# Patient Record
Sex: Female | Born: 1955
Health system: Southern US, Community
[De-identification: ages and names within clinical notes are randomized; demographics above are authoritative.]

## PROBLEM LIST (undated history)

## (undated) DIAGNOSIS — T4145XA Adverse effect of unspecified anesthetic, initial encounter: Secondary | ICD-10-CM

## (undated) DIAGNOSIS — R32 Unspecified urinary incontinence: Secondary | ICD-10-CM

## (undated) DIAGNOSIS — D649 Anemia, unspecified: Secondary | ICD-10-CM

## (undated) DIAGNOSIS — F32A Depression, unspecified: Secondary | ICD-10-CM

## (undated) DIAGNOSIS — F329 Major depressive disorder, single episode, unspecified: Secondary | ICD-10-CM

## (undated) DIAGNOSIS — J449 Chronic obstructive pulmonary disease, unspecified: Secondary | ICD-10-CM

## (undated) DIAGNOSIS — Z923 Personal history of irradiation: Secondary | ICD-10-CM

## (undated) DIAGNOSIS — E2839 Other primary ovarian failure: Secondary | ICD-10-CM

## (undated) DIAGNOSIS — G4730 Sleep apnea, unspecified: Secondary | ICD-10-CM

## (undated) DIAGNOSIS — R809 Proteinuria, unspecified: Secondary | ICD-10-CM

## (undated) DIAGNOSIS — E119 Type 2 diabetes mellitus without complications: Secondary | ICD-10-CM

## (undated) DIAGNOSIS — R0902 Hypoxemia: Secondary | ICD-10-CM

## (undated) DIAGNOSIS — I2510 Atherosclerotic heart disease of native coronary artery without angina pectoris: Secondary | ICD-10-CM

## (undated) DIAGNOSIS — Z9981 Dependence on supplemental oxygen: Secondary | ICD-10-CM

## (undated) DIAGNOSIS — M792 Neuralgia and neuritis, unspecified: Secondary | ICD-10-CM

## (undated) DIAGNOSIS — Z9221 Personal history of antineoplastic chemotherapy: Secondary | ICD-10-CM

## (undated) DIAGNOSIS — C189 Malignant neoplasm of colon, unspecified: Secondary | ICD-10-CM

## (undated) DIAGNOSIS — I10 Essential (primary) hypertension: Secondary | ICD-10-CM

## (undated) DIAGNOSIS — Z87442 Personal history of urinary calculi: Secondary | ICD-10-CM

## (undated) DIAGNOSIS — H3530 Unspecified macular degeneration: Secondary | ICD-10-CM

## (undated) DIAGNOSIS — K760 Fatty (change of) liver, not elsewhere classified: Secondary | ICD-10-CM

## (undated) DIAGNOSIS — I499 Cardiac arrhythmia, unspecified: Secondary | ICD-10-CM

## (undated) DIAGNOSIS — T8859XA Other complications of anesthesia, initial encounter: Secondary | ICD-10-CM

## (undated) DIAGNOSIS — E213 Hyperparathyroidism, unspecified: Secondary | ICD-10-CM

## (undated) DIAGNOSIS — Z8709 Personal history of other diseases of the respiratory system: Secondary | ICD-10-CM

## (undated) DIAGNOSIS — E785 Hyperlipidemia, unspecified: Secondary | ICD-10-CM

## (undated) DIAGNOSIS — K649 Unspecified hemorrhoids: Secondary | ICD-10-CM

## (undated) DIAGNOSIS — J439 Emphysema, unspecified: Secondary | ICD-10-CM

## (undated) DIAGNOSIS — M542 Cervicalgia: Secondary | ICD-10-CM

## (undated) DIAGNOSIS — I479 Paroxysmal tachycardia, unspecified: Secondary | ICD-10-CM

## (undated) DIAGNOSIS — M1990 Unspecified osteoarthritis, unspecified site: Secondary | ICD-10-CM

## (undated) DIAGNOSIS — Z981 Arthrodesis status: Secondary | ICD-10-CM

## (undated) DIAGNOSIS — K219 Gastro-esophageal reflux disease without esophagitis: Secondary | ICD-10-CM

## (undated) DIAGNOSIS — G8929 Other chronic pain: Secondary | ICD-10-CM

## (undated) DIAGNOSIS — R7303 Prediabetes: Secondary | ICD-10-CM

## (undated) DIAGNOSIS — F419 Anxiety disorder, unspecified: Secondary | ICD-10-CM

## (undated) DIAGNOSIS — K7581 Nonalcoholic steatohepatitis (NASH): Secondary | ICD-10-CM

## (undated) DIAGNOSIS — R011 Cardiac murmur, unspecified: Secondary | ICD-10-CM

## (undated) DIAGNOSIS — R112 Nausea with vomiting, unspecified: Secondary | ICD-10-CM

## (undated) DIAGNOSIS — L409 Psoriasis, unspecified: Secondary | ICD-10-CM

## (undated) DIAGNOSIS — I700 Atherosclerosis of aorta: Secondary | ICD-10-CM

## (undated) DIAGNOSIS — H269 Unspecified cataract: Secondary | ICD-10-CM

## (undated) DIAGNOSIS — Z9889 Other specified postprocedural states: Secondary | ICD-10-CM

## (undated) DIAGNOSIS — K5900 Constipation, unspecified: Secondary | ICD-10-CM

## (undated) DIAGNOSIS — T7840XA Allergy, unspecified, initial encounter: Secondary | ICD-10-CM

## (undated) HISTORY — DX: Hyperlipidemia, unspecified: E78.5

## (undated) HISTORY — DX: Sleep apnea, unspecified: G47.30

## (undated) HISTORY — DX: Gastro-esophageal reflux disease without esophagitis: K21.9

## (undated) HISTORY — PX: NECK SURGERY: SHX720

## (undated) HISTORY — DX: Unspecified cataract: H26.9

## (undated) HISTORY — DX: Essential (primary) hypertension: I10

## (undated) HISTORY — DX: Personal history of other diseases of the respiratory system: Z87.09

## (undated) HISTORY — DX: Allergy, unspecified, initial encounter: T78.40XA

## (undated) HISTORY — DX: Hypoxemia: R09.02

## (undated) HISTORY — DX: Proteinuria, unspecified: R80.9

## (undated) HISTORY — DX: Unspecified urinary incontinence: R32

## (undated) HISTORY — DX: Neuralgia and neuritis, unspecified: M79.2

## (undated) HISTORY — PX: ABDOMINAL HYSTERECTOMY: SHX81

## (undated) HISTORY — DX: Unspecified hemorrhoids: K64.9

## (undated) HISTORY — PX: BREAST SURGERY: SHX581

## (undated) HISTORY — DX: Cardiac murmur, unspecified: R01.1

## (undated) HISTORY — DX: Type 2 diabetes mellitus without complications: E11.9

## (undated) HISTORY — DX: Other primary ovarian failure: E28.39

## (undated) HISTORY — DX: Paroxysmal tachycardia, unspecified: I47.9

## (undated) HISTORY — DX: Unspecified macular degeneration: H35.30

## (undated) HISTORY — DX: Constipation, unspecified: K59.00

## (undated) HISTORY — PX: SPINE SURGERY: SHX786

## (undated) HISTORY — DX: Emphysema, unspecified: J43.9

## (undated) HISTORY — PX: TUBAL LIGATION: SHX77

## (undated) HISTORY — DX: Psoriasis, unspecified: L40.9

---
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
## 1988-08-09 HISTORY — PX: ANTERIOR FUSION LUMBAR SPINE: SUR629

## 1998-08-22 ENCOUNTER — Ambulatory Visit (HOSPITAL_COMMUNITY): Admission: RE | Admit: 1998-08-22 | Discharge: 1998-08-22 | Payer: Self-pay | Admitting: Neurosurgery

## 1998-08-22 ENCOUNTER — Encounter: Payer: Self-pay | Admitting: Neurosurgery

## 1998-10-30 ENCOUNTER — Encounter: Payer: Self-pay | Admitting: Neurosurgery

## 1998-11-03 ENCOUNTER — Inpatient Hospital Stay (HOSPITAL_COMMUNITY): Admission: RE | Admit: 1998-11-03 | Discharge: 1998-11-05 | Payer: Self-pay | Admitting: Neurosurgery

## 1998-11-03 ENCOUNTER — Encounter: Payer: Self-pay | Admitting: Neurosurgery

## 1999-03-21 ENCOUNTER — Encounter: Payer: Self-pay | Admitting: Neurosurgery

## 1999-03-21 ENCOUNTER — Ambulatory Visit (HOSPITAL_COMMUNITY): Admission: RE | Admit: 1999-03-21 | Discharge: 1999-03-21 | Payer: Self-pay | Admitting: Neurosurgery

## 1999-06-16 ENCOUNTER — Encounter: Payer: Self-pay | Admitting: Neurosurgery

## 1999-06-16 ENCOUNTER — Encounter: Admission: RE | Admit: 1999-06-16 | Discharge: 1999-06-16 | Payer: Self-pay | Admitting: Neurosurgery

## 1999-08-10 HISTORY — PX: ANTERIOR CERVICAL DECOMP/DISCECTOMY FUSION: SHX1161

## 1999-08-10 HISTORY — PX: OTHER SURGICAL HISTORY: SHX169

## 2005-05-10 ENCOUNTER — Emergency Department: Payer: Self-pay | Admitting: General Practice

## 2005-06-07 ENCOUNTER — Other Ambulatory Visit: Payer: Self-pay

## 2005-06-07 ENCOUNTER — Emergency Department: Payer: Self-pay | Admitting: Emergency Medicine

## 2005-06-22 DIAGNOSIS — I272 Pulmonary hypertension, unspecified: Secondary | ICD-10-CM

## 2005-06-22 HISTORY — DX: Pulmonary hypertension, unspecified: I27.20

## 2005-08-26 ENCOUNTER — Ambulatory Visit: Payer: Self-pay | Admitting: Cardiovascular Disease

## 2006-02-05 ENCOUNTER — Emergency Department: Payer: Self-pay | Admitting: Emergency Medicine

## 2006-05-26 ENCOUNTER — Ambulatory Visit: Payer: Self-pay | Admitting: Gastroenterology

## 2006-05-26 LAB — HM COLONOSCOPY

## 2006-06-03 ENCOUNTER — Ambulatory Visit: Payer: Self-pay | Admitting: Family Medicine

## 2006-06-29 ENCOUNTER — Ambulatory Visit: Payer: Self-pay

## 2006-08-30 ENCOUNTER — Emergency Department: Payer: Self-pay | Admitting: Emergency Medicine

## 2007-07-18 ENCOUNTER — Ambulatory Visit: Payer: Self-pay | Admitting: Unknown Physician Specialty

## 2007-12-07 ENCOUNTER — Ambulatory Visit: Payer: Self-pay | Admitting: Unknown Physician Specialty

## 2007-12-12 ENCOUNTER — Ambulatory Visit: Payer: Self-pay | Admitting: Unknown Physician Specialty

## 2008-02-16 ENCOUNTER — Ambulatory Visit: Payer: Self-pay | Admitting: Unknown Physician Specialty

## 2008-10-07 LAB — HM DEXA SCAN

## 2009-02-27 ENCOUNTER — Ambulatory Visit: Payer: Self-pay | Admitting: Family Medicine

## 2009-12-03 ENCOUNTER — Emergency Department: Payer: Self-pay | Admitting: Emergency Medicine

## 2011-02-14 ENCOUNTER — Emergency Department: Payer: Self-pay | Admitting: Internal Medicine

## 2011-12-07 ENCOUNTER — Ambulatory Visit: Payer: Self-pay | Admitting: Family Medicine

## 2012-05-09 HISTORY — PX: CARDIAC CATHETERIZATION: SHX172

## 2012-05-16 ENCOUNTER — Ambulatory Visit: Payer: Self-pay | Admitting: Specialist

## 2012-05-22 ENCOUNTER — Ambulatory Visit: Payer: Self-pay | Admitting: Cardiology

## 2012-06-08 ENCOUNTER — Emergency Department: Payer: Self-pay | Admitting: Unknown Physician Specialty

## 2012-06-08 LAB — TROPONIN I
Troponin-I: <0.02 ng/mL
Troponin-I: <0.02 ng/mL

## 2012-06-08 LAB — BASIC METABOLIC PANEL
Chloride: 110 mmol/L — ABNORMAL HIGH (ref 98–107)
Co2: 24 mmol/L (ref 21–32)
Creatinine: 0.64 mg/dL (ref 0.60–1.30)
EGFR (African American): >60
Osmolality: 286 (ref 275–301)
Potassium: 3.5 mmol/L (ref 3.5–5.1)
Sodium: 144 mmol/L (ref 136–145)

## 2012-06-08 LAB — CBC
HCT: 36.6 % (ref 35.0–47.0)
HGB: 12.4 g/dL (ref 12.0–16.0)
MCH: 29.7 pg (ref 26.0–34.0)
MCHC: 34 g/dL (ref 32.0–36.0)
MCV: 87 fL (ref 80–100)
RBC: 4.19 10*6/uL (ref 3.80–5.20)
RDW: 14.6 % — ABNORMAL HIGH (ref 11.5–14.5)

## 2012-06-08 LAB — URINALYSIS, COMPLETE
Bacteria: NONE SEEN
Blood: NEGATIVE
Glucose,UR: NEGATIVE mg/dL (ref 0–75)
Nitrite: NEGATIVE
RBC,UR: NONE SEEN /HPF (ref 0–5)
Specific Gravity: 1.005 (ref 1.003–1.030)
Squamous Epithelial: NONE SEEN

## 2012-06-08 LAB — CK TOTAL AND CKMB (NOT AT ARMC)
CK, Total: 128 U/L (ref 21–215)
CK-MB: 2.2 ng/mL (ref 0.5–3.6)

## 2012-08-06 ENCOUNTER — Emergency Department: Payer: Self-pay | Admitting: Emergency Medicine

## 2012-09-18 ENCOUNTER — Encounter: Payer: Self-pay | Admitting: *Deleted

## 2012-09-25 ENCOUNTER — Ambulatory Visit (INDEPENDENT_AMBULATORY_CARE_PROVIDER_SITE_OTHER): Payer: BC Managed Care – PPO | Admitting: Cardiovascular Disease

## 2012-09-25 ENCOUNTER — Encounter: Payer: Self-pay | Admitting: Cardiovascular Disease

## 2012-09-25 VITALS — BP 120/88 | HR 84 | Ht 64.0 in | Wt 176.5 lb

## 2012-09-25 DIAGNOSIS — I10 Essential (primary) hypertension: Secondary | ICD-10-CM

## 2012-09-25 DIAGNOSIS — R002 Palpitations: Secondary | ICD-10-CM

## 2012-09-25 DIAGNOSIS — I479 Paroxysmal tachycardia, unspecified: Secondary | ICD-10-CM

## 2012-09-25 MED ORDER — METOPROLOL TARTRATE 50 MG PO TABS: 50.0000 mg | ORAL_TABLET | Freq: Two times a day (BID) | ORAL | Status: DC

## 2012-09-25 MED ORDER — LOSARTAN POTASSIUM-HCTZ 100-12.5 MG PO TABS: 1.0000 | ORAL_TABLET | Freq: Every day | ORAL | Status: DC

## 2012-09-25 NOTE — Assessment & Plan Note (Signed)
Her blood pressure is reasonably controlled. However, she has significant lower extremity edema which I suspect is due to chronic venous insufficiency as well as amlodipine. Thus, I will start amlodipine benazepril and switch her to losartan/ hydrochlorothiazide 100/12.5 mg once daily. Check basic metabolic profile in one week. Continue to monitor home blood pressure. Echocardiogram from last year showed normal LV systolic function. Right heart catheterization showed no significant pulmonary hypertension.

## 2012-09-25 NOTE — Patient Instructions (Addendum)
Stop Sotalol Stop Amlodipine- Benazepril.   Start Metoprolol 50 mg twice daily.  Start Losartan-HCTZ 100-12.5 mg once daily.   Check labs in 1 week Follow up in 1 month.

## 2012-09-25 NOTE — Progress Notes (Signed)
HPI  This is a pleasant 57 year old female who is here today to establish cardiovascular care. She has been seen in the past by Dr. Humphrey Rolls at [facility] for unspecified tachycardia. She has been placed on sotalol for at least 5 years with no recurrent documented arrhythmia. She takes 80 mg once daily. She also has known history of hypertension. She has unspecified lung disease followed by Dr. Raul Del. She had an echocardiogram done last year which showed normal LV systolic function. However, there was evidence of moderate pulmonary hypertension with a systolic pulmonary artery pressure of 56 mm mercury. Thus, she underwent a right heart catheterization which showed only mild pulmonary hypertension with a mean pressure of 21 mm mercury. Her main issue at this time is lower extremity edema which has been worsening over the last few months. It's worse at the end of the day and associated with significant tenderness. She has no history of previous DVT. She denies significant chest pain or dyspnea. She has very minor palpitations which are not frequent.  Allergies  Allergen Reactions  . Sulfa Antibiotics      Current Outpatient Prescriptions on File Prior to Visit  Medication Sig Dispense Refill  . ALPRAZolam (XANAX) 0.5 MG tablet Take 0.5 mg by mouth at bedtime as needed for sleep.      Marland Kitchen diclofenac (VOLTAREN) 75 MG EC tablet Take 75 mg by mouth 2 (two) times daily.       Marland Kitchen gemfibrozil (LOPID) 600 MG tablet Take 600 mg by mouth daily.      Marland Kitchen HYDROcodone-acetaminophen (NORCO/VICODIN) 5-325 MG per tablet Take 1 tablet by mouth 3 (three) times daily as needed for pain.      Marland Kitchen tiZANidine (ZANAFLEX) 4 MG tablet Take 4 mg by mouth 3 (three) times daily as needed.      . vitamin E 400 UNIT capsule Take 400 Units by mouth daily.       No current facility-administered medications on file prior to visit.     Past Medical History  Diagnosis Date  . H/O allergic rhinitis   . Neuritis     . Urinary incontinence   . Proteinuria   . GERD (gastroesophageal reflux disease)   . Hyperlipidemia   . Ovarian failure   . Constipation   . Psoriasis   . Heart murmur   . Tachycardia, paroxysmal   . Hypertension      Past Surgical History  Procedure Laterality Date  . Tubal ligation      s/p  . Tonsillectomy      history  . Herniated disc repair    . Anterior fusion lumbar spine    . Cardiac catheterization  2009?     [facility]  . Cardiac catheterization  05/2012    RHC: Mild hypertension 37/12 with a mean pressure of 21 mm mercury     Family History  Problem Relation Age of Onset  . Heart attack Mother      History   Social History  . Marital Status: Married    Spouse Name: N/A    Number of Children: N/A  . Years of Education: N/A   Occupational History  . Not on file.   Social History Main Topics  . Smoking status: Former Smoker -- 1.00 packs/day for 32 years    Types: Cigarettes  . Smokeless tobacco: Not on file  . Alcohol Use: No  . Drug Use: No  . Sexually Active: Not on file   Other Topics  Concern  . Not on file   Social History Narrative  . No narrative on file     ROS Constitutional: Negative for fever, chills, diaphoresis, activity change, appetite change and fatigue.  HENT: Negative for hearing loss, nosebleeds, congestion, sore throat, facial swelling, drooling, trouble swallowing, neck pain, voice change, sinus pressure and tinnitus.  Eyes: Negative for photophobia, pain, discharge and visual disturbance.  Respiratory: Negative for apnea, cough, chest tightness, shortness of breath and wheezing.  Cardiovascular: Negative for chest pain. Gastrointestinal: Negative for nausea, vomiting, abdominal pain, diarrhea, constipation, blood in stool and abdominal distention.  Genitourinary: Negative for dysuria, urgency, frequency, hematuria and decreased urine volume.  Musculoskeletal: Negative for myalgias, back pain, joint swelling,  arthralgias and gait problem.  Skin: Negative for color change, pallor, rash and wound.  Neurological: Negative for dizziness, tremors, seizures, syncope, speech difficulty, weakness, light-headedness, numbness and headaches.  Psychiatric/Behavioral: Negative for suicidal ideas, hallucinations, behavioral problems and agitation. The patient is not nervous/anxious.     PHYSICAL EXAM   BP 120/88  Pulse 84  Ht 5' 4"  (1.626 m)  Wt 176 lb 8 oz (80.06 kg)  BMI 30.28 kg/m2 Constitutional: She is oriented to person, place, and time. She appears well-developed and well-nourished. No distress.  HENT: No nasal discharge.  Head: Normocephalic and atraumatic.  Eyes: Pupils are equal and round. Right eye exhibits no discharge. Left eye exhibits no discharge.  Neck: Normal range of motion. Neck supple. No JVD present. No thyromegaly present.  Cardiovascular: Normal rate, regular rhythm, normal heart sounds. Exam reveals no gallop and no friction rub. No murmur heard.  Pulmonary/Chest: Effort normal and breath sounds normal. No stridor. No respiratory distress. She has no wheezes. She has no rales. She exhibits no tenderness.  Abdominal: Soft. Bowel sounds are normal. She exhibits no distension. There is no tenderness. There is no rebound and no guarding.  Musculoskeletal: Normal range of motion. She exhibits + 1 edema with mild tenderness.  Neurological: She is alert and oriented to person, place, and time. Coordination normal.  Skin: Skin is warm and dry. No rash noted. She is not diaphoretic. No erythema. No pallor.  Psychiatric: She has a normal mood and affect. Her behavior is normal. Judgment and thought content normal.     EKG: Sinus  Rhythm  WITHIN NORMAL LIMITS   ASSESSMENT AND PLAN

## 2012-09-25 NOTE — Assessment & Plan Note (Signed)
The patient has unspecified tachycardia with no recent episodes. He does not seem that she had atrial fibrillation. Thus, I will go ahead and stop sotalol as well as atenolol. I will put her instead on metoprolol 50 mg twice daily. Continue to monitor symptoms. If she develops recurrent palpitations, I would consider monitoring to see what kind of arrhythmia she has.

## 2012-09-26 ENCOUNTER — Telehealth: Payer: Self-pay

## 2012-09-26 NOTE — Telephone Encounter (Signed)
Message copied by [facility], Adell Panek E on Tue Sep 26, 2012  7:58 AM ------      Message from: Lorine Bears A      Created: Mon Sep 25, 2012  3:57 PM       I asked her to stop taking Atenolol but forgot to write it on the AVS. Please call her and make sure she does not take Atenolol.       Basically , Metoprolol is replacing both Sotalol and Atenolol.  ------

## 2012-09-26 NOTE — Telephone Encounter (Signed)
lmtcb

## 2012-09-26 NOTE — Telephone Encounter (Signed)
Pt informed Understanding verb Says she already did this

## 2012-10-02 ENCOUNTER — Ambulatory Visit (INDEPENDENT_AMBULATORY_CARE_PROVIDER_SITE_OTHER): Payer: BC Managed Care – PPO

## 2012-10-02 DIAGNOSIS — I10 Essential (primary) hypertension: Secondary | ICD-10-CM

## 2012-10-03 LAB — BASIC METABOLIC PANEL
CO2: 26 mmol/L (ref 19–28)
Calcium: 9.7 mg/dL (ref 8.7–10.2)
Creatinine, Ser: 0.93 mg/dL (ref 0.57–1.00)
Sodium: 144 mmol/L (ref 134–144)

## 2012-10-20 ENCOUNTER — Telehealth: Payer: Self-pay

## 2012-10-20 NOTE — Telephone Encounter (Signed)
Since pt has been on Toprol, has made pt very "tired and fatigued". Please advise

## 2012-10-20 NOTE — Telephone Encounter (Signed)
I called her and asked her to decrease Losartan/HCTZ to 1/2 tablet daily. I did not want to decrease Metoprolol given that she is slightly tachycardiac. Check on her on Monday. If HR is >100 , bring her for an EKG.

## 2012-10-20 NOTE — Telephone Encounter (Signed)
Pt states she has been very fatigued since starting metoprolol 50 mg BID HR=</=115 BPM BP=90/65 Ok to decrease dose?

## 2012-10-23 NOTE — Telephone Encounter (Signed)
Pt says HR staying less than 100 BPM and she is feeling "some better" She will call us if HR is elevated again

## 2012-10-23 NOTE — Telephone Encounter (Signed)
lmtcb

## 2012-11-03 ENCOUNTER — Encounter: Payer: Self-pay | Admitting: Cardiovascular Disease

## 2012-11-03 ENCOUNTER — Ambulatory Visit (INDEPENDENT_AMBULATORY_CARE_PROVIDER_SITE_OTHER): Payer: BC Managed Care – PPO | Admitting: Cardiovascular Disease

## 2012-11-03 VITALS — BP 100/76 | HR 100 | Ht 64.0 in | Wt 176.5 lb

## 2012-11-03 DIAGNOSIS — I10 Essential (primary) hypertension: Secondary | ICD-10-CM

## 2012-11-03 DIAGNOSIS — R079 Chest pain, unspecified: Secondary | ICD-10-CM

## 2012-11-03 DIAGNOSIS — I479 Paroxysmal tachycardia, unspecified: Secondary | ICD-10-CM

## 2012-11-03 DIAGNOSIS — R000 Tachycardia, unspecified: Secondary | ICD-10-CM

## 2012-11-03 MED ORDER — LOSARTAN POTASSIUM 25 MG PO TABS: 25.0000 mg | ORAL_TABLET | Freq: Every day | ORAL | Status: DC

## 2012-11-03 MED ORDER — METOPROLOL TARTRATE 25 MG PO TABS: 25.0000 mg | ORAL_TABLET | Freq: Two times a day (BID) | ORAL | Status: DC

## 2012-11-03 NOTE — Assessment & Plan Note (Signed)
She seems to be having sinus tachycardia likely related to beta blocker withdrawal. I reviewed her home blood pressure and heart rate readings. Her heart rate is ranging from 90-110 beats per minute. I recommend resuming metoprolol but at a smaller dose 25 mg twice daily.

## 2012-11-03 NOTE — Assessment & Plan Note (Signed)
Lower extremity edema resolved after stopping amlodipine. However, her blood pressure has been running low since making the changes. Thus, I will stop hydrochlorothiazide and continue with a smaller dose losartan 25 mg once daily. Followup in one month to recheck.

## 2012-11-03 NOTE — Patient Instructions (Addendum)
Stop taking Losartan-Hydrochlorothiazide.  Start Losartan 25 mg once daily.  Resume Metoprolol 25 mg twice daily.   Call us in 1 week to update [place] about blood pressure and heart rate.  Follow up in 1 month.

## 2012-11-03 NOTE — Progress Notes (Signed)
HPI  This is a pleasant 57 year old female who is here today for a followup visit . She has been seen in the past by Dr. Humphrey Rolls at [facility] for unspecified tachycardia. She has been placed on sotalol for at least 5 years with no recurrent documented arrhythmia. She was taking 80 mg once daily. She also has known history of hypertension. She has unspecified lung disease followed by Dr. Raul Del. She had an echocardiogram done last year which showed normal LV systolic function. However, there was evidence of moderate pulmonary hypertension with a systolic pulmonary artery pressure of 56 mm mercury. Thus, she underwent a right heart catheterization which showed only mild pulmonary hypertension with a mean pressure of 21 mm mercury.  Her main issue during initial evaluation was lower extremity edema which was thought to be due to amlodipine. I decided to make changes to her medications. I stopped sotalol and atenolol and switch her to metoprolol. Amlodipine was discontinued and she was switched to losartan/hydrochlorothiazide. Her lower extremity edema resolved after a few days. However, she started feeling tired and fatigue with low blood pressure. She also noticed palpitations. She stopped taking metoprolol one week ago because she felt it was too strong. The dose of losartan hydrochlorothiazide was cut in half.  Allergies  Allergen Reactions  . Sulfa Antibiotics      Current Outpatient Prescriptions on File Prior to Visit  Medication Sig Dispense Refill  . ABILIFY 5 MG tablet 2.5 mg daily.       Marland Kitchen ALPRAZolam (XANAX) 0.5 MG tablet Take 0.5 mg by mouth at bedtime as needed for sleep.      Marland Kitchen diclofenac (VOLTAREN) 75 MG EC tablet Take 75 mg by mouth 2 (two) times daily.       . DULoxetine (CYMBALTA) 30 MG capsule Take 30 mg by mouth 3 (three) times daily.      Marland Kitchen gemfibrozil (LOPID) 600 MG tablet Take 600 mg by mouth daily.      Marland Kitchen HYDROcodone-acetaminophen (NORCO/VICODIN) 5-325 MG  per tablet Take 1 tablet by mouth 3 (three) times daily as needed for pain.      Marland Kitchen omeprazole (PRILOSEC) 40 MG capsule Take 40 mg by mouth daily.       Marland Kitchen tiZANidine (ZANAFLEX) 4 MG tablet Take 4 mg by mouth 3 (three) times daily as needed.      . vitamin E 400 UNIT capsule Take 400 Units by mouth daily.       No current facility-administered medications on file prior to visit.     Past Medical History  Diagnosis Date  . H/O allergic rhinitis   . Neuritis   . Urinary incontinence   . Proteinuria   . GERD (gastroesophageal reflux disease)   . Hyperlipidemia   . Ovarian failure   . Constipation   . Psoriasis   . Heart murmur   . Tachycardia, paroxysmal   . Hypertension      Past Surgical History  Procedure Laterality Date  . Tubal ligation      s/p  . Tonsillectomy      history  . Herniated disc repair    . Anterior fusion lumbar spine    . Cardiac catheterization  2009?     [facility]  . Cardiac catheterization  05/2012    RHC: Mild hypertension 37/12 with a mean pressure of 21 mm mercury     Family History  Problem Relation Age of Onset  . Heart attack Mother  History   Social History  . Marital Status: Married    Spouse Name: N/A    Number of Children: N/A  . Years of Education: N/A   Occupational History  . Not on file.   Social History Main Topics  . Smoking status: Former Smoker -- 1.00 packs/day for 32 years    Types: Cigarettes  . Smokeless tobacco: Not on file  . Alcohol Use: No  . Drug Use: No  . Sexually Active: Not on file   Other Topics Concern  . Not on file   Social History Narrative  . No narrative on file    PHYSICAL EXAM   BP 100/76  Pulse 100  Ht 5' 4"  (1.626 m)  Wt 176 lb 8 oz (80.06 kg)  BMI 30.28 kg/m2 Constitutional: She is oriented to person, place, and time. She appears well-developed and well-nourished. No distress.  HENT: No nasal discharge.  Head: Normocephalic and atraumatic.  Eyes: Pupils are equal and  round. Right eye exhibits no discharge. Left eye exhibits no discharge.  Neck: Normal range of motion. Neck supple. No JVD present. No thyromegaly present.  Cardiovascular: Normal rate, regular rhythm, normal heart sounds. Exam reveals no gallop and no friction rub. No murmur heard.  Pulmonary/Chest: Effort normal and breath sounds normal. No stridor. No respiratory distress. She has no wheezes. She has no rales. She exhibits no tenderness.  Abdominal: Soft. Bowel sounds are normal. She exhibits no distension. There is no tenderness. There is no rebound and no guarding.  Musculoskeletal: Normal range of motion. She exhibits + 1 edema with mild tenderness.  Neurological: She is alert and oriented to person, place, and time. Coordination normal.  Skin: Skin is warm and dry. No rash noted. She is not diaphoretic. No erythema. No pallor.  Psychiatric: She has a normal mood and affect. Her behavior is normal. Judgment and thought content normal.     EKG: Sinus  Rhythm . Heart rate is 100 beats per minute WITHIN NORMAL LIMITS   ASSESSMENT AND PLAN

## 2012-11-20 ENCOUNTER — Telehealth: Payer: Self-pay

## 2012-11-20 NOTE — Telephone Encounter (Signed)
Ok. Sounds good.

## 2012-11-20 NOTE — Telephone Encounter (Signed)
lmtcb re:bp/hr assessments

## 2012-11-20 NOTE — Telephone Encounter (Signed)
Pt says BP and HR has been very good 103/71, 128/71, 107/72, 111/67 HR=80,76,85,78,80,89,64,93  I told her to continue her current regimen and I will let Dr. Kirke Corin know Understanding verb

## 2012-11-27 ENCOUNTER — Encounter: Payer: Self-pay | Admitting: *Deleted

## 2012-12-04 ENCOUNTER — Ambulatory Visit: Payer: BC Managed Care – PPO | Admitting: Cardiovascular Disease

## 2012-12-25 ENCOUNTER — Ambulatory Visit (INDEPENDENT_AMBULATORY_CARE_PROVIDER_SITE_OTHER): Payer: BC Managed Care – PPO | Admitting: Cardiovascular Disease

## 2012-12-25 ENCOUNTER — Encounter: Payer: Self-pay | Admitting: Cardiovascular Disease

## 2012-12-25 VITALS — BP 140/80 | HR 82 | Ht 64.0 in | Wt 177.2 lb

## 2012-12-25 DIAGNOSIS — I10 Essential (primary) hypertension: Secondary | ICD-10-CM

## 2012-12-25 DIAGNOSIS — I479 Paroxysmal tachycardia, unspecified: Secondary | ICD-10-CM

## 2012-12-25 DIAGNOSIS — R000 Tachycardia, unspecified: Secondary | ICD-10-CM

## 2012-12-25 MED ORDER — LOSARTAN POTASSIUM 50 MG PO TABS: 50.0000 mg | ORAL_TABLET | Freq: Every day | ORAL | Status: DC

## 2012-12-25 NOTE — Progress Notes (Signed)
HPI  This is a pleasant 57 year old female who is here today for a followup visit . She has been seen in the past by Dr. Humphrey Rolls at [facility] for unspecified tachycardia. She used to be on sotalol for at least 5 years with no recurrent documented arrhythmia. She also has known history of hypertension. She has unspecified lung disease followed by Dr. Raul Del. She had an echocardiogram done in 2013 which showed normal LV systolic function. However, there was evidence of moderate pulmonary hypertension with a systolic pulmonary artery pressure of 56 mm mercury. Thus, she underwent a right heart catheterization which showed only mild pulmonary hypertension with a mean pressure of 21 mm mercury.  Her main issue during initial evaluation was lower extremity edema which was thought to be due to amlodipine. I decided to make changes to her medications. I stopped sotalol and atenolol and switched her to metoprolol. Amlodipine was discontinued and she was switched to losartan/hydrochlorothiazide. Her lower extremity edema resolved after a few days. However, she started feeling tired and fatigue with low blood pressure.  Thus, during last visit I decreased the dose of metoprolol to 25 mg once daily, stop hydrochlorothiazide and decreased losartan to 25 mg once daily. Overall, she has been feeling better. Her blood pressure started going up again over the last 2 weeks. She has not had any further palpitations.  Allergies  Allergen Reactions  . Sulfa Antibiotics      Current Outpatient Prescriptions on File Prior to Visit  Medication Sig Dispense Refill  . ABILIFY 5 MG tablet 2.5 mg daily.       Marland Kitchen ALPRAZolam (XANAX) 0.5 MG tablet Take 0.5 mg by mouth at bedtime as needed for sleep.      Marland Kitchen diclofenac (VOLTAREN) 75 MG EC tablet Take 75 mg by mouth 2 (two) times daily.       . DULoxetine (CYMBALTA) 30 MG capsule Take 30 mg by mouth 3 (three) times daily.      Marland Kitchen gemfibrozil (LOPID) 600 MG tablet  Take 600 mg by mouth daily.      Marland Kitchen HYDROcodone-acetaminophen (NORCO/VICODIN) 5-325 MG per tablet Take 1 tablet by mouth 3 (three) times daily as needed for pain.      Marland Kitchen losartan (COZAAR) 25 MG tablet Take 1 tablet (25 mg total) by mouth daily.  30 tablet  6  . metoprolol tartrate (LOPRESSOR) 25 MG tablet Take 1 tablet (25 mg total) by mouth 2 (two) times daily.  60 tablet  6  . omeprazole (PRILOSEC) 40 MG capsule Take 40 mg by mouth daily.       Marland Kitchen tiZANidine (ZANAFLEX) 4 MG tablet Take 4 mg by mouth 3 (three) times daily as needed.      . vitamin E 400 UNIT capsule Take 400 Units by mouth daily.       No current facility-administered medications on file prior to visit.     Past Medical History  Diagnosis Date  . H/O allergic rhinitis   . Neuritis   . Urinary incontinence   . Proteinuria   . GERD (gastroesophageal reflux disease)   . Hyperlipidemia   . Ovarian failure   . Constipation   . Psoriasis   . Heart murmur   . Tachycardia, paroxysmal   . Hypertension      Past Surgical History  Procedure Laterality Date  . Tubal ligation      s/p  . Tonsillectomy      history  . Herniated disc  repair    . Anterior fusion lumbar spine    . Cardiac catheterization  2009?     [facility]  . Cardiac catheterization  05/2012    RHC: Mild hypertension 37/12 with a mean pressure of 21 mm mercury     Family History  Problem Relation Age of Onset  . Heart attack Mother      History   Social History  . Marital Status: Married    Spouse Name: N/A    Number of Children: N/A  . Years of Education: N/A   Occupational History  . Not on file.   Social History Main Topics  . Smoking status: Former Smoker -- 1.00 packs/day for 32 years    Types: Cigarettes  . Smokeless tobacco: Not on file  . Alcohol Use: No  . Drug Use: No  . Sexually Active: Not on file   Other Topics Concern  . Not on file   Social History Narrative  . No narrative on file    PHYSICAL EXAM   There  were no vitals taken for this visit. Constitutional: She is oriented to person, place, and time. She appears well-developed and well-nourished. No distress.  HENT: No nasal discharge.  Head: Normocephalic and atraumatic.  Eyes: Pupils are equal and round. Right eye exhibits no discharge. Left eye exhibits no discharge.  Neck: Normal range of motion. Neck supple. No JVD present. No thyromegaly present.  Cardiovascular: Normal rate, regular rhythm, normal heart sounds. Exam reveals no gallop and no friction rub. No murmur heard.  Pulmonary/Chest: Effort normal and breath sounds normal. No stridor. No respiratory distress. She has no wheezes. She has no rales. She exhibits no tenderness.  Abdominal: Soft. Bowel sounds are normal. She exhibits no distension. There is no tenderness. There is no rebound and no guarding.  Musculoskeletal: Normal range of motion. She exhibits + 1 edema with mild tenderness.  Neurological: She is alert and oriented to person, place, and time. Coordination normal.  Skin: Skin is warm and dry. No rash noted. She is not diaphoretic. No erythema. No pallor.  Psychiatric: She has a normal mood and affect. Her behavior is normal. Judgment and thought content normal.     EKG: Sinus  Rhythm  WITHIN NORMAL LIMITS   ASSESSMENT AND PLAN

## 2012-12-25 NOTE — Assessment & Plan Note (Signed)
There has been no documented arrhythmia that I am aware of other than sinus tachycardia. Symptoms now are well controlled on metoprolol 25 mg twice daily.

## 2012-12-25 NOTE — Patient Instructions (Addendum)
Increase Losartan to 50 mg once daily.  Continue other medications.  Follow up in 6 months.

## 2012-12-25 NOTE — Assessment & Plan Note (Signed)
Blood pressure has been going up gradually over the last 2 weeks. Thus, I will increase losartan to 50 mg once daily. If blood pressure remains elevated, I might consider resuming hydrochlorothiazide at a smaller dose of 12.5 mg once daily.

## 2013-01-08 ENCOUNTER — Encounter: Payer: Self-pay | Admitting: Cardiovascular Disease

## 2013-01-09 ENCOUNTER — Other Ambulatory Visit: Payer: Self-pay | Admitting: Cardiovascular Disease

## 2013-01-09 MED ORDER — HYDROCHLOROTHIAZIDE 12.5 MG PO TABS: 12.5000 mg | ORAL_TABLET | Freq: Every day | ORAL | Status: DC

## 2013-01-10 ENCOUNTER — Other Ambulatory Visit: Payer: Self-pay

## 2013-01-10 MED ORDER — HYDROCHLOROTHIAZIDE 12.5 MG PO TABS: 12.5000 mg | ORAL_TABLET | Freq: Every day | ORAL | Status: DC

## 2013-01-10 NOTE — Telephone Encounter (Signed)
error 

## 2013-01-10 NOTE — Telephone Encounter (Signed)
Refill sent to [facility] pharmacy for Hydrochlorothiazide. #30 Refill#6.

## 2013-03-03 ENCOUNTER — Ambulatory Visit: Payer: Self-pay | Admitting: Physician Assistant

## 2013-04-16 ENCOUNTER — Ambulatory Visit: Payer: Self-pay | Admitting: Family Medicine

## 2013-06-14 ENCOUNTER — Other Ambulatory Visit: Payer: Self-pay

## 2013-06-22 ENCOUNTER — Other Ambulatory Visit: Payer: Self-pay | Admitting: *Deleted

## 2013-06-22 MED ORDER — METOPROLOL TARTRATE 25 MG PO TABS: 25.0000 mg | ORAL_TABLET | Freq: Two times a day (BID) | ORAL | Status: DC

## 2013-06-26 ENCOUNTER — Ambulatory Visit (INDEPENDENT_AMBULATORY_CARE_PROVIDER_SITE_OTHER): Payer: BC Managed Care – PPO | Admitting: Cardiovascular Disease

## 2013-06-26 ENCOUNTER — Encounter: Payer: Self-pay | Admitting: Cardiovascular Disease

## 2013-06-26 VITALS — BP 100/62 | HR 78 | Ht 64.0 in | Wt 176.5 lb

## 2013-06-26 DIAGNOSIS — I479 Paroxysmal tachycardia, unspecified: Secondary | ICD-10-CM

## 2013-06-26 DIAGNOSIS — I10 Essential (primary) hypertension: Secondary | ICD-10-CM

## 2013-06-26 NOTE — Progress Notes (Signed)
HPI  This is a pleasant 57 year old female who is here today for a followup visit regarding tachycardia and hypertension. She has been seen in the past by Dr. Humphrey Rolls at [facility] for unspecified tachycardia. She used to be on sotalol for at least 5 years with no recurrent documented arrhythmia. She also has known history of hypertension. She has unspecified lung disease followed by Dr. Raul Del. She had an echocardiogram done in 2013 which showed normal LV systolic function. However, there was evidence of moderate pulmonary hypertension with a systolic pulmonary artery pressure of 56 mm mercury. Thus, she underwent a right heart catheterization which showed only mild pulmonary hypertension with a mean pressure of 21 mm mercury.  Her main issue during initial evaluation was lower extremity edema which was thought to be due to amlodipine. I decided to make changes to her medications. I stopped sotalol and atenolol and switched her to metoprolol. Amlodipine was discontinued and she was switched to losartan/hydrochlorothiazide. She had hypotension after this which required further adjustments in the dose. Blood pressure continues to run on the low side with systolic blood pressure around 100.  Allergies  Allergen Reactions  . Sulfa Antibiotics      Current Outpatient Prescriptions on File Prior to Visit  Medication Sig Dispense Refill  . ABILIFY 5 MG tablet 2.5 mg daily.       Marland Kitchen ALPRAZolam (XANAX) 0.5 MG tablet Take 0.5 mg by mouth at bedtime as needed for sleep.      . clobetasol cream (TEMOVATE) 0.05 % 2 (two) times daily.       Marland Kitchen desonide (DESOWEN) 0.05 % cream 2 (two) times daily.       . diclofenac (VOLTAREN) 75 MG EC tablet Take 75 mg by mouth 2 (two) times daily.       . DULoxetine (CYMBALTA) 30 MG capsule Take 30 mg by mouth 3 (three) times daily.      Marland Kitchen gemfibrozil (LOPID) 600 MG tablet Take 600 mg by mouth daily.      . hydrochlorothiazide (HYDRODIURIL) 12.5 MG tablet  Take 1 tablet (12.5 mg total) by mouth daily.  30 tablet  6  . HYDROcodone-acetaminophen (NORCO/VICODIN) 5-325 MG per tablet Take 1 tablet by mouth 3 (three) times daily as needed for pain.      . metoprolol tartrate (LOPRESSOR) 25 MG tablet Take 1 tablet (25 mg total) by mouth 2 (two) times daily.  60 tablet  0  . omeprazole (PRILOSEC) 40 MG capsule Take 40 mg by mouth daily.       Marland Kitchen tiZANidine (ZANAFLEX) 4 MG tablet Take 4 mg by mouth 3 (three) times daily as needed.      . vitamin E 400 UNIT capsule Take 400 Units by mouth daily.       No current facility-administered medications on file prior to visit.     Past Medical History  Diagnosis Date  . H/O allergic rhinitis   . Neuritis   . Urinary incontinence   . Proteinuria   . GERD (gastroesophageal reflux disease)   . Hyperlipidemia   . Ovarian failure   . Constipation   . Psoriasis   . Heart murmur   . Tachycardia, paroxysmal   . Hypertension      Past Surgical History  Procedure Laterality Date  . Tubal ligation      s/p  . Tonsillectomy      history  . Herniated disc repair    . Anterior fusion lumbar spine    .  Cardiac catheterization  2009?     [facility]  . Cardiac catheterization  05/2012    RHC: Mild hypertension 37/12 with a mean pressure of 21 mm mercury     Family History  Problem Relation Age of Onset  . Heart attack Mother      History   Social History  . Marital Status: Married    Spouse Name: N/A    Number of Children: N/A  . Years of Education: N/A   Occupational History  . Not on file.   Social History Main Topics  . Smoking status: Former Smoker -- 1.00 packs/day for 32 years    Types: Cigarettes  . Smokeless tobacco: Not on file  . Alcohol Use: No  . Drug Use: No  . Sexual Activity: Not on file   Other Topics Concern  . Not on file   Social History Narrative  . No narrative on file    PHYSICAL EXAM   BP 100/62  Pulse 78  Ht 5' 4"  (1.626 m)  Wt 176 lb 8 oz (80.06 kg)   BMI 30.28 kg/m2 Constitutional: She is oriented to person, place, and time. She appears well-developed and well-nourished. No distress.  HENT: No nasal discharge.  Head: Normocephalic and atraumatic.  Eyes: Pupils are equal and round. Right eye exhibits no discharge. Left eye exhibits no discharge.  Neck: Normal range of motion. Neck supple. No JVD present. No thyromegaly present.  Cardiovascular: Normal rate, regular rhythm, normal heart sounds. Exam reveals no gallop and no friction rub. No murmur heard.  Pulmonary/Chest: Effort normal and breath sounds normal. No stridor. No respiratory distress. She has no wheezes. She has no rales. She exhibits no tenderness.  Abdominal: Soft. Bowel sounds are normal. She exhibits no distension. There is no tenderness. There is no rebound and no guarding.  Musculoskeletal: Normal range of motion. She exhibits no edema with mild tenderness.  Neurological: She is alert and oriented to person, place, and time. Coordination normal.  Skin: Skin is warm and dry. No rash noted. She is not diaphoretic. No erythema. No pallor.  Psychiatric: She has a normal mood and affect. Her behavior is normal. Judgment and thought content normal.     EKG: Sinus  Rhythm  WITHIN NORMAL LIMITS   ASSESSMENT AND PLAN

## 2013-06-26 NOTE — Assessment & Plan Note (Signed)
No recurrent tachycardia on metoprolol 25 mg twice daily which will be continued.

## 2013-06-26 NOTE — Assessment & Plan Note (Signed)
Blood pressure is still running on the low side. She usually feels very tired and dizzy when blood pressure is low. Thus, I will stop losartan and continue treatment with hydrochlorothiazide and metoprolol.

## 2013-06-26 NOTE — Patient Instructions (Signed)
Stop taking Losartan.  Continue other medications.   Your physician wants you to follow-up in: 6 months.  You will receive a reminder letter in the mail two months in advance. If you don't receive a letter, please call our office to schedule the follow-up appointment.

## 2013-06-28 ENCOUNTER — Ambulatory Visit: Payer: Self-pay | Admitting: Family Medicine

## 2013-07-16 LAB — HM PAP SMEAR

## 2013-07-20 ENCOUNTER — Other Ambulatory Visit: Payer: Self-pay | Admitting: Cardiovascular Disease

## 2013-07-24 ENCOUNTER — Ambulatory Visit: Payer: Self-pay | Admitting: Family Medicine

## 2013-07-24 LAB — HM MAMMOGRAPHY: HM Mammogram: NORMAL

## 2013-08-19 ENCOUNTER — Other Ambulatory Visit: Payer: Self-pay | Admitting: Cardiovascular Disease

## 2013-11-23 LAB — HEMOGLOBIN A1C: Hgb A1c MFr Bld: 6.2 % — AB (ref 4.0–6.0)

## 2013-11-23 LAB — LIPID PANEL
Cholesterol: 193 mg/dL (ref 0–200)
HDL: 41 mg/dL (ref 35–70)
LDL Cholesterol: 103 mg/dL
TRIGLYCERIDES: 245 mg/dL — AB (ref 40–160)

## 2014-02-09 ENCOUNTER — Other Ambulatory Visit: Payer: Self-pay | Admitting: Cardiovascular Disease

## 2014-03-12 ENCOUNTER — Ambulatory Visit: Payer: Self-pay | Admitting: Family Medicine

## 2014-03-22 ENCOUNTER — Other Ambulatory Visit: Payer: Self-pay | Admitting: Cardiovascular Disease

## 2014-12-13 ENCOUNTER — Other Ambulatory Visit: Payer: Self-pay | Admitting: Family Medicine

## 2014-12-13 ENCOUNTER — Ambulatory Visit
Admission: RE | Admit: 2014-12-13 | Discharge: 2014-12-13 | Disposition: A | Discharge status: Home / Self Care | Payer: 59 | Source: Ambulatory Visit | Attending: Family Medicine | Admitting: Family Medicine

## 2014-12-13 DIAGNOSIS — M5440 Lumbago with sciatica, unspecified side: Secondary | ICD-10-CM

## 2014-12-13 DIAGNOSIS — M545 Low back pain: Secondary | ICD-10-CM | POA: Insufficient documentation

## 2014-12-13 DIAGNOSIS — G3189 Other specified degenerative diseases of nervous system: Secondary | ICD-10-CM | POA: Diagnosis not present

## 2014-12-13 DIAGNOSIS — M419 Scoliosis, unspecified: Secondary | ICD-10-CM | POA: Diagnosis not present

## 2014-12-13 DIAGNOSIS — M5489 Other dorsalgia: Secondary | ICD-10-CM

## 2015-01-09 ENCOUNTER — Other Ambulatory Visit: Payer: Self-pay | Admitting: Family Medicine

## 2015-01-09 NOTE — Telephone Encounter (Signed)
Called pharmacy and they state she has been on Lopressor 50 mg bid since 10/2013.

## 2015-01-13 ENCOUNTER — Other Ambulatory Visit: Payer: Self-pay | Admitting: Family Medicine

## 2015-01-16 ENCOUNTER — Ambulatory Visit (INDEPENDENT_AMBULATORY_CARE_PROVIDER_SITE_OTHER): Payer: 59 | Admitting: Family Medicine

## 2015-01-16 ENCOUNTER — Encounter: Payer: Self-pay | Admitting: Family Medicine

## 2015-01-16 VITALS — BP 122/76 | HR 99 | Temp 97.7°F | Resp 18 | Ht 64.5 in | Wt 180.2 lb

## 2015-01-16 DIAGNOSIS — F411 Generalized anxiety disorder: Secondary | ICD-10-CM

## 2015-01-16 DIAGNOSIS — E781 Pure hyperglyceridemia: Secondary | ICD-10-CM

## 2015-01-16 DIAGNOSIS — L409 Psoriasis, unspecified: Secondary | ICD-10-CM | POA: Insufficient documentation

## 2015-01-16 DIAGNOSIS — M5416 Radiculopathy, lumbar region: Secondary | ICD-10-CM | POA: Diagnosis not present

## 2015-01-16 DIAGNOSIS — E8881 Metabolic syndrome: Secondary | ICD-10-CM | POA: Insufficient documentation

## 2015-01-16 DIAGNOSIS — R600 Localized edema: Secondary | ICD-10-CM | POA: Insufficient documentation

## 2015-01-16 DIAGNOSIS — K5900 Constipation, unspecified: Secondary | ICD-10-CM | POA: Insufficient documentation

## 2015-01-16 DIAGNOSIS — N3941 Urge incontinence: Secondary | ICD-10-CM | POA: Insufficient documentation

## 2015-01-16 DIAGNOSIS — G4733 Obstructive sleep apnea (adult) (pediatric): Secondary | ICD-10-CM | POA: Diagnosis not present

## 2015-01-16 DIAGNOSIS — I10 Essential (primary) hypertension: Secondary | ICD-10-CM | POA: Diagnosis not present

## 2015-01-16 DIAGNOSIS — M7060 Trochanteric bursitis, unspecified hip: Secondary | ICD-10-CM | POA: Insufficient documentation

## 2015-01-16 DIAGNOSIS — M542 Cervicalgia: Secondary | ICD-10-CM

## 2015-01-16 DIAGNOSIS — J309 Allergic rhinitis, unspecified: Secondary | ICD-10-CM | POA: Insufficient documentation

## 2015-01-16 DIAGNOSIS — F339 Major depressive disorder, recurrent, unspecified: Secondary | ICD-10-CM | POA: Diagnosis not present

## 2015-01-16 DIAGNOSIS — G8929 Other chronic pain: Secondary | ICD-10-CM

## 2015-01-16 DIAGNOSIS — K219 Gastro-esophageal reflux disease without esophagitis: Secondary | ICD-10-CM | POA: Insufficient documentation

## 2015-01-16 MED ORDER — ARIPIPRAZOLE 5 MG PO TABS: 5.0000 mg | ORAL_TABLET | Freq: Every evening | ORAL | Status: DC

## 2015-01-16 MED ORDER — PREDNISONE 10 MG (48) PO TBPK: ORAL_TABLET | Freq: Every day | ORAL | Status: DC

## 2015-01-16 MED ORDER — TIZANIDINE HCL 4 MG PO TABS: 4.0000 mg | ORAL_TABLET | Freq: Three times a day (TID) | ORAL | Status: DC

## 2015-01-16 MED ORDER — ALPRAZOLAM ER 1 MG PO TB24: 1.0000 mg | ORAL_TABLET | Freq: Every day | ORAL | Status: DC

## 2015-01-16 MED ORDER — HYDROCHLOROTHIAZIDE 12.5 MG PO CAPS: 12.5000 mg | ORAL_CAPSULE | Freq: Every day | ORAL | Status: DC

## 2015-01-16 MED ORDER — DULOXETINE HCL 60 MG PO CPEP: 60.0000 mg | ORAL_CAPSULE | Freq: Every day | ORAL | Status: DC

## 2015-01-16 MED ORDER — GABAPENTIN 300 MG PO CAPS: 300.0000 mg | ORAL_CAPSULE | Freq: Three times a day (TID) | ORAL | Status: DC

## 2015-01-16 MED ORDER — HYDROCODONE-ACETAMINOPHEN 5-325 MG PO TABS: 1.0000 | ORAL_TABLET | Freq: Four times a day (QID) | ORAL | Status: DC | PRN

## 2015-01-16 MED ORDER — GEMFIBROZIL 600 MG PO TABS: 600.0000 mg | ORAL_TABLET | Freq: Every day | ORAL | Status: DC

## 2015-01-16 NOTE — Progress Notes (Signed)
Name: Judy Allen   MRN: [id number]    DOB: 21-May-1956   Date:01/16/2015       Progress Note  Subjective  Chief Complaint  Chief Complaint  Patient presents with  . Hypertension  . Hyperlipidemia  . Anxiety  . Depression  . Neck Pain    HPI  Low back pain with radiculitis : She has a history of low back surgery - discectomy - and over the past three months she has noticed recurrence of pain, that is described as sharp, shooting pain that intermittently radiates to left groin and posterior thigh down to her left knee. Pain improves with hydrocodone, pain is aggravated by staying in one position for a prolonged period of time. She denies a rash, but she feels weak on the left leg and at times she feels like she is going to fall. No bowel or bladder incontinence, no saddle anesthesia.  HTN/Paroxysmal tachycardia: taking medication as prescribed and is tolerating well, bp has been at goal, palpitation still happens daily, and sometimes secondary to anxiety. No SOB or chest pain.  Denies side effects of medications  GAD: she has been taking Duloxetine and Alprazolam XR, she states she is feeling better, living with her daughter and stress is down.  Hypertriglyceridemia: she is off Lovaza because of lack of insurance coverage taking Gemfibrozil - and she is taking it as directed and denies side effects. Last labs done 4/92/2016 showed triglycerides of 371. Discussed dietary changes again  Patient Active Problem List   Diagnosis Date Noted  . Benign hypertension 01/16/2015  . Chronic cervical pain 01/16/2015  . CN (constipation) 01/16/2015  . Gastric reflux 01/16/2015  . Hypertriglyceridemia 01/16/2015  . Edema leg 01/16/2015  . Chronic recurrent major depressive disorder 01/16/2015  . Dysmetabolic syndrome 93/71/6967  . Obstructive apnea 01/16/2015  . Psoriasis 01/16/2015  . Allergic rhinitis 01/16/2015  . Bursitis, trochanteric 01/16/2015  . GAD (generalized anxiety disorder)  01/16/2015  . Tachycardia, paroxysmal     Past Surgical History  Procedure Laterality Date  . Tubal ligation      s/p  . Tonsillectomy      history  . Herniated disc repair    . Anterior fusion lumbar spine    . Cardiac catheterization  2009?     [facility]  . Cardiac catheterization  05/2012    RHC: Mild hypertension 37/12 with a mean pressure of 21 mm mercury    Family History  Problem Relation Age of Onset  . Heart attack Mother     History   Social History  . Marital Status: Married    Spouse Name: N/A  . Number of Children: N/A  . Years of Education: N/A   Occupational History  . Not on file.   Social History Main Topics  . Smoking status: Former Smoker -- 1.00 packs/day for 32 years    Types: Cigarettes    Start date: 08/10/1963    Quit date: 01/15/2006  . Smokeless tobacco: Not on file  . Alcohol Use: No  . Drug Use: No  . Sexual Activity: No   Other Topics Concern  . Not on file   Social History Narrative     Current outpatient prescriptions:  .  ALPRAZolam (XANAX XR) 1 MG 24 hr tablet, Take 1 tablet by mouth daily., Disp: , Rfl:  .  ARIPiprazole (ABILIFY) 5 MG tablet, Take 1 tablet by mouth every evening., Disp: , Rfl:  .  clobetasol cream (TEMOVATE) 0.05 %, 2 (two) times  daily. , Disp: , Rfl:  .  desonide (DESOWEN) 0.05 % cream, 2 (two) times daily. , Disp: , Rfl:  .  diclofenac (VOLTAREN) 75 MG EC tablet, Take 75 mg by mouth 2 (two) times daily. , Disp: , Rfl:  .  DULoxetine (CYMBALTA) 60 MG capsule, Take 1 capsule by mouth daily., Disp: , Rfl:  .  gabapentin (NEURONTIN) 300 MG capsule, Take 1 capsule by mouth daily., Disp: , Rfl:  .  gemfibrozil (LOPID) 600 MG tablet, Take 1 tablet by mouth daily., Disp: , Rfl:  .  hydrochlorothiazide (MICROZIDE) 12.5 MG capsule, Take 1 capsule by mouth daily., Disp: , Rfl:  .  HYDROcodone-acetaminophen (NORCO/VICODIN) 5-325 MG per tablet, Take 1 tablet by mouth 3 (three) times daily as needed., Disp: , Rfl:  .   metoprolol (LOPRESSOR) 50 MG tablet, Take 1 tablet by mouth 2 (two) times daily., Disp: , Rfl:  .  omeprazole (PRILOSEC) 40 MG capsule, Take 1 capsule by mouth daily., Disp: , Rfl:  .  tiZANidine (ZANAFLEX) 4 MG tablet, Take 1 tablet by mouth 3 (three) times daily., Disp: , Rfl:  .  vitamin E 400 UNIT capsule, Take 1 capsule by mouth 2 (two) times daily., Disp: , Rfl:   Allergies  Allergen Reactions  . Sulfa Antibiotics      ROS  Constitutional: Negative for fever or significant  weight change.  Respiratory: Negative for cough and shortness of breath.   Cardiovascular: Negative for chest pain but she still has episodes of palpitation.  Gastrointestinal: Negative for abdominal pain, no bowel changes.  Musculoskeletal: Limping secondary to pain, low back pain with radiculitis down the left. Neck is always sore Skin: Negative for rash.  Neurological: Negative for dizziness or headache.  No other specific complaints in a complete review of systems (except as listed in HPI above).  Objective  Filed Vitals:   01/16/15 1345  BP: 122/76  Pulse: 99  Temp: 97.7 F (36.5 C)  TempSrc: Oral  Resp: 18  Height: 5' 4.5" (1.638 m)  Weight: 180 lb 3.2 oz (81.738 kg)  SpO2: 95%    Body mass index is 30.46 kg/(m^2).  Physical Exam    Constitutional: Patient appears well-developed and well-nourished. No distress.  HENT: Head: Normocephalic and atraumatic. Ears: B TMs ok, no erythema or effusion; Nose: Nose normal. Mouth/Throat: Oropharynx is clear and moist. No oropharyngeal exudate.  Eyes: Conjunctivae and EOM are normal. Pupils are equal, round, and reactive to light. No scleral icterus.  Neck: Normal range of motion. Neck supple, pain during palpation of posterior neck. No JVD present. No thyromegaly present.  Cardiovascular: Normal rate, regular rhythm and normal heart sounds.  No murmur heard. No BLE edema. Pulmonary/Chest: Effort normal and breath sounds normal. No respiratory  distress. Abdominal: Soft. Bowel sounds are normal, no distension. There is no tenderness. no masses Musculoskeletal: Normal range of motion, no joint effusions. No gross deformities , pain during palpation of lumbar spine, over spinal processes, no paraspinal muscle pain, normal strength of lower extremity, neg straight leg raise, normal sensation of both legs Neurological: he is alert and oriented to person, place, and time. No cranial nerve deficit. Coordination, balance, strength, speech . Patellar reflex is 3 plus bilaterally Skin: Skin is warm and dry. No rash noted. No erythema.  Psychiatric: Patient has a normal mood and affect. behavior is normal. Judgment and thought content normal.     PHQ2/9: Depression screen PHQ 2/9 01/16/2015  Decreased Interest 1  Down, Depressed, Hopeless  1  PHQ - 2 Score 2     Fall Risk: Fall Risk  01/16/2015  Falls in the past year? No     Assessment & Plan   1. Left lumbar radiculitis We will start a prednisone taper and if no improvement we will order MRI lumbar spine , increase gabapentin to three times daily   - HYDROcodone-acetaminophen (NORCO/VICODIN) 5-325 MG per tablet; Take 1 tablet by mouth every 6 (six) hours as needed for moderate pain.  Dispense: 90 tablet; Refill: 0  2. GAD (generalized anxiety disorder) stable - ALPRAZolam (XANAX XR) 1 MG 24 hr tablet; Take 1 tablet (1 mg total) by mouth daily.  Dispense: 30 tablet; Refill: 2  3. Obstructive apnea Not on CPAP because of cost  4. Hypertriglyceridemia Off Lovaza because of cost - gemfibrozil (LOPID) 600 MG tablet; Take 1 tablet (600 mg total) by mouth daily.  Dispense: 30 tablet; Refill: 5  5. Chronic cervical pain Stable on medication - tiZANidine (ZANAFLEX) 4 MG tablet; Take 1 tablet (4 mg total) by mouth 3 (three) times daily.  Dispense: 90 tablet; Refill: 2 - HYDROcodone-acetaminophen (NORCO/VICODIN) 5-325 MG per tablet; Take 1 tablet by mouth every 6 (six) hours as needed  for moderate pain.  Dispense: 90 tablet; Refill: 0  6. Chronic recurrent major depressive disorder Stable at this time - DULoxetine (CYMBALTA) 60 MG capsule; Take 1 capsule (60 mg total) by mouth daily.  Dispense: 30 capsule; Refill: 5 - ARIPiprazole (ABILIFY) 5 MG tablet; Take 1 tablet (5 mg total) by mouth every evening.  Dispense: 30 tablet; Refill: 5  7. Benign hypertension At goal  - hydrochlorothiazide (MICROZIDE) 12.5 MG capsule; Take 1 capsule (12.5 mg total) by mouth daily.  Dispense: 30 capsule; Refill: 5

## 2015-01-16 NOTE — Patient Instructions (Signed)

## 2015-02-12 ENCOUNTER — Other Ambulatory Visit: Payer: Self-pay | Admitting: Family Medicine

## 2015-02-12 NOTE — Telephone Encounter (Signed)
Patient requesting refill. 

## 2015-02-19 ENCOUNTER — Telehealth: Payer: Self-pay | Admitting: Family Medicine

## 2015-02-19 DIAGNOSIS — M5416 Radiculopathy, lumbar region: Secondary | ICD-10-CM

## 2015-02-19 NOTE — Telephone Encounter (Signed)
PT SAYS THAT THE DR PUT HER ON PREDIZONE AND IT HELPED HER AND THEN THE NEXT WK AFTER SHE WAS DONE WITH IT EVERYTHING WAS STILL GOOD BUT AFTER THAT FIRST WK THE BACK AND L HIP PAIN GOING DOWN HER LFT LEG STARTED BACK AGAIN VERY BAD. PLEASE ADVISE.

## 2015-02-20 ENCOUNTER — Other Ambulatory Visit: Payer: Self-pay

## 2015-02-20 DIAGNOSIS — M545 Low back pain: Secondary | ICD-10-CM

## 2015-02-21 NOTE — Telephone Encounter (Signed)
Patient called back and was informed since the Prednisone did not help, that Dr. Ancil Boozer would like for patient to have a MRI done for her lumbar spine.

## 2015-02-27 ENCOUNTER — Telehealth: Payer: Self-pay | Admitting: Family Medicine

## 2015-02-27 NOTE — Telephone Encounter (Signed)
PRE AUTH DEPT CALLED AND NEEDS YOU TO CALL THEM BACK. CAN NOT DO TEST TILL AUTH IS GOT. DIAG CODE IS M54.16 CPT [id number]. FOR MRI

## 2015-02-27 NOTE — Telephone Encounter (Signed)
Prior Auth # [id number],  MRI Lumbar w/o contrast  Valid - 02/27/2015 Expires - 04/13/2015  Notified Pam at [facility] Pre-Auth

## 2015-02-28 ENCOUNTER — Ambulatory Visit
Admission: RE | Admit: 2015-02-28 | Discharge: 2015-02-28 | Disposition: A | Discharge status: Home / Self Care | Payer: Commercial Managed Care - HMO | Source: Ambulatory Visit | Attending: Family Medicine | Admitting: Family Medicine

## 2015-02-28 DIAGNOSIS — M5416 Radiculopathy, lumbar region: Secondary | ICD-10-CM | POA: Diagnosis present

## 2015-02-28 DIAGNOSIS — M5136 Other intervertebral disc degeneration, lumbar region: Secondary | ICD-10-CM | POA: Insufficient documentation

## 2015-03-02 ENCOUNTER — Other Ambulatory Visit: Payer: Self-pay | Admitting: Family Medicine

## 2015-03-02 DIAGNOSIS — M5416 Radiculopathy, lumbar region: Secondary | ICD-10-CM

## 2015-03-03 ENCOUNTER — Telehealth: Payer: Self-pay | Admitting: Family Medicine

## 2015-03-03 NOTE — Telephone Encounter (Signed)
Pt is returning your call

## 2015-03-12 ENCOUNTER — Telehealth: Payer: Self-pay | Admitting: Family Medicine

## 2015-03-12 NOTE — Telephone Encounter (Signed)
Requesting for you to up the dosage on Hydrocodone for her lower back and legs. She is currently taking 5-336m

## 2015-03-13 NOTE — Telephone Encounter (Signed)
Patient requesting higher dose of medication for pain.

## 2015-03-14 NOTE — Telephone Encounter (Signed)
She was just seen by Dr. Christella Noa and I will not be able to adjust dose without seeing her, on his notes states symptoms improved with steroid taper

## 2015-03-14 NOTE — Telephone Encounter (Signed)
Pt notified she would need to make an appointment for first avaible

## 2015-03-22 ENCOUNTER — Other Ambulatory Visit: Payer: Self-pay | Admitting: Family Medicine

## 2015-04-02 ENCOUNTER — Encounter: Payer: Self-pay | Admitting: Family Medicine

## 2015-04-02 ENCOUNTER — Ambulatory Visit (INDEPENDENT_AMBULATORY_CARE_PROVIDER_SITE_OTHER): Payer: 59 | Admitting: Family Medicine

## 2015-04-02 VITALS — BP 118/76 | HR 74 | Temp 97.6°F | Resp 16 | Ht 65.0 in | Wt 179.0 lb

## 2015-04-02 DIAGNOSIS — Z981 Arthrodesis status: Secondary | ICD-10-CM

## 2015-04-02 DIAGNOSIS — M5416 Radiculopathy, lumbar region: Secondary | ICD-10-CM | POA: Diagnosis not present

## 2015-04-02 DIAGNOSIS — M542 Cervicalgia: Secondary | ICD-10-CM | POA: Diagnosis not present

## 2015-04-02 DIAGNOSIS — G8929 Other chronic pain: Secondary | ICD-10-CM

## 2015-04-02 MED ORDER — HYDROCODONE-ACETAMINOPHEN 5-325 MG PO TABS: 1.0000 | ORAL_TABLET | Freq: Three times a day (TID) | ORAL | Status: DC

## 2015-04-02 NOTE — Progress Notes (Signed)
Name: Judy Allen   MRN: [id number]    DOB: 29-Aug-1955   Date:04/02/2015       Progress Note  Subjective  Chief Complaint  Chief Complaint  Patient presents with  . Back Pain    Onset-4 months, constant pain, lower back and radiates down left leg. Patient takes Hydrocodone 5 mg states it only easy the pain a little bit. Symptoms are unchanged and going to Neurologist on Monday for Back Injections.    HPI  Low back pain with Radiculitis: also has a history of chronic cervical pain, used to take only hydrocodone 5/325 mg once daily for her neck, however over the past 4 months developed low back pain with radiculitis and is now seeing neurosurgeon Dr. Arneta Cliche and is will have steroid injection on Monday. She was taking more than one pill daily of hydrocodone to control pain and ran out of medication. Explained the pain contract again and that is the last time I will refill medication sooner than due date.  Advised her to discuss pain management with neurosurgeon from now on.    Patient Active Problem List   Diagnosis Date Noted  . Benign hypertension 01/16/2015  . Chronic cervical pain 01/16/2015  . CN (constipation) 01/16/2015  . Gastric reflux 01/16/2015  . Hypertriglyceridemia 01/16/2015  . Edema leg 01/16/2015  . Chronic recurrent major depressive disorder 01/16/2015  . Dysmetabolic syndrome 40/34/7425  . Obstructive apnea 01/16/2015  . Psoriasis 01/16/2015  . Allergic rhinitis 01/16/2015  . Bursitis, trochanteric 01/16/2015  . GAD (generalized anxiety disorder) 01/16/2015  . Tachycardia, paroxysmal     Past Surgical History  Procedure Laterality Date  . Tubal ligation      s/p  . Tonsillectomy      history  . Herniated disc repair    . Anterior fusion lumbar spine    . Cardiac catheterization  2009?     [facility]  . Cardiac catheterization  05/2012    RHC: Mild hypertension 37/12 with a mean pressure of 21 mm mercury    Family History  Problem Relation Age of  Onset  . Heart attack Mother     Social History   Social History  . Marital Status: Married    Spouse Name: N/A  . Number of Children: N/A  . Years of Education: N/A   Occupational History  . Not on file.   Social History Main Topics  . Smoking status: Former Smoker -- 1.00 packs/day for 32 years    Types: Cigarettes    Start date: 08/10/1963    Quit date: 01/15/2006  . Smokeless tobacco: Never Used  . Alcohol Use: No  . Drug Use: No  . Sexual Activity: No   Other Topics Concern  . Not on file   Social History Narrative     Current outpatient prescriptions:  .  ALPRAZolam (XANAX XR) 1 MG 24 hr tablet, Take 1 tablet (1 mg total) by mouth daily., Disp: 30 tablet, Rfl: 2 .  ARIPiprazole (ABILIFY) 5 MG tablet, Take 1 tablet (5 mg total) by mouth every evening., Disp: 30 tablet, Rfl: 5 .  clobetasol cream (TEMOVATE) 0.05 %, 2 (two) times daily. , Disp: , Rfl:  .  desonide (DESOWEN) 0.05 % cream, 2 (two) times daily. , Disp: , Rfl:  .  diclofenac (VOLTAREN) 75 MG EC tablet, Take 75 mg by mouth 2 (two) times daily. , Disp: , Rfl:  .  DULoxetine (CYMBALTA) 60 MG capsule, Take 1 capsule (60 mg total) by  mouth daily., Disp: 30 capsule, Rfl: 5 .  gabapentin (NEURONTIN) 300 MG capsule, Take 1 capsule (300 mg total) by mouth 3 (three) times daily., Disp: 90 capsule, Rfl: 5 .  gemfibrozil (LOPID) 600 MG tablet, Take 1 tablet (600 mg total) by mouth daily., Disp: 30 tablet, Rfl: 5 .  hydrochlorothiazide (MICROZIDE) 12.5 MG capsule, Take 1 capsule (12.5 mg total) by mouth daily., Disp: 30 capsule, Rfl: 5 .  HYDROcodone-acetaminophen (NORCO/VICODIN) 5-325 MG per tablet, Take 1 tablet by mouth 3 (three) times daily., Disp: 21 tablet, Rfl: 0 .  metoprolol (LOPRESSOR) 50 MG tablet, TAKE 1 TABLET TWICE A DAY, Disp: 180 tablet, Rfl: 1 .  omeprazole (PRILOSEC) 40 MG capsule, TAKE 1 CAPSULE EVERY DAY, Disp: 30 capsule, Rfl: 5 .  tiZANidine (ZANAFLEX) 4 MG tablet, Take 1 tablet (4 mg total) by  mouth 3 (three) times daily., Disp: 90 tablet, Rfl: 2 .  vitamin E 400 UNIT capsule, Take 1 capsule by mouth 2 (two) times daily., Disp: , Rfl:   Allergies  Allergen Reactions  . Sulfa Antibiotics      ROS  Ten systems reviewed and is negative except as mentioned in HPI  Objective  Filed Vitals:   04/02/15 1141  BP: 118/76  Pulse: 74  Temp: 97.6 F (36.4 C)  TempSrc: Oral  Resp: 16  Height: 5\' 5"  (1.651 m)  Weight: 179 lb (81.194 kg)  SpO2: 90%    Body mass index is 29.79 kg/(m^2).  Physical Exam  Constitutional: Patient appears well-developed and well-nourished. Obese  No distress.  HEENT: head atraumatic, normocephalic, pupils equal and reactive to light, neck decrease rom , throat within normal limits Cardiovascular: Normal rate, regular rhythm and normal heart sounds.  No murmur heard. No BLE edema. Pulmonary/Chest: Effort normal and breath sounds normal. No respiratory distress. Abdominal: Soft.  There is no tenderness. Psychiatric: Patient has a depressed mood and flat affect. behavior is normal. Judgment and thought content normal. Muscular Skeletal: pain during palpation of lumbar spine left paraspinal muscle and spinal process, negative straight leg raise, crepitus with extension of both knees, worse on left side   PHQ2/9: Depression screen [facility] 2/9 04/02/2015 01/16/2015  Decreased Interest 3 1  Down, Depressed, Hopeless 3 1  PHQ - 2 Score 6 2  Altered sleeping 2 -  Tired, decreased energy 3 -  Change in appetite 1 -  Feeling bad or failure about yourself  2 -  Trouble concentrating 0 -  Moving slowly or fidgety/restless 1 -  Suicidal thoughts 0 -  PHQ-9 Score 15 -  Difficult doing work/chores Not difficult at all -     Fall Risk: Fall Risk  04/02/2015 01/16/2015  Falls in the past year? No No      Assessment & Plan  1. Left lumbar radiculitis Until she sees neurosurgeon on Monday. - HYDROcodone-acetaminophen (NORCO/VICODIN) 5-325 MG per tablet;  Take 1 tablet by mouth 3 (three) times daily.  Dispense: 21 tablet; Refill: 0  2. Chronic cervical pain  - HYDROcodone-acetaminophen (NORCO/VICODIN) 5-325 MG per tablet; Take 1 tablet by mouth 3 (three) times daily.  Dispense: 21 tablet; Refill: 0

## 2015-04-08 ENCOUNTER — Other Ambulatory Visit: Payer: Self-pay | Admitting: Family Medicine

## 2015-04-09 ENCOUNTER — Other Ambulatory Visit: Payer: Self-pay | Admitting: Family Medicine

## 2015-04-09 NOTE — Telephone Encounter (Signed)
Patient requesting refill. 

## 2015-04-11 ENCOUNTER — Other Ambulatory Visit: Payer: Self-pay | Admitting: Family Medicine

## 2015-04-11 NOTE — Telephone Encounter (Signed)
Patient requesting refill. 

## 2015-04-18 ENCOUNTER — Ambulatory Visit (INDEPENDENT_AMBULATORY_CARE_PROVIDER_SITE_OTHER): Payer: 59 | Admitting: Family Medicine

## 2015-04-18 ENCOUNTER — Encounter: Payer: Self-pay | Admitting: Family Medicine

## 2015-04-18 VITALS — BP 114/68 | HR 73 | Temp 97.7°F | Resp 14 | Ht 64.0 in | Wt 179.0 lb

## 2015-04-18 DIAGNOSIS — M5416 Radiculopathy, lumbar region: Secondary | ICD-10-CM

## 2015-04-18 DIAGNOSIS — M542 Cervicalgia: Secondary | ICD-10-CM | POA: Diagnosis not present

## 2015-04-18 DIAGNOSIS — G8929 Other chronic pain: Secondary | ICD-10-CM | POA: Diagnosis not present

## 2015-04-18 DIAGNOSIS — K219 Gastro-esophageal reflux disease without esophagitis: Secondary | ICD-10-CM | POA: Diagnosis not present

## 2015-04-18 DIAGNOSIS — I10 Essential (primary) hypertension: Secondary | ICD-10-CM | POA: Diagnosis not present

## 2015-04-18 DIAGNOSIS — Z23 Encounter for immunization: Secondary | ICD-10-CM | POA: Diagnosis not present

## 2015-04-18 DIAGNOSIS — F411 Generalized anxiety disorder: Secondary | ICD-10-CM

## 2015-04-18 DIAGNOSIS — F339 Major depressive disorder, recurrent, unspecified: Secondary | ICD-10-CM

## 2015-04-18 MED ORDER — ALPRAZOLAM ER 1 MG PO TB24: 1.0000 mg | ORAL_TABLET | Freq: Every day | ORAL | Status: DC

## 2015-04-18 NOTE — Progress Notes (Signed)
Name: Judy Allen   MRN: [id number]    DOB: 1956/04/20   Date:04/18/2015       Progress Note  Subjective  Chief Complaint  Chief Complaint  Patient presents with  . Medication Refill    3 month F/U  . Hypertension    Well Controlled  . Back Pain    Unchanged-[facility] upper her dose of Hydrocodone-takes the edge off of her pain  . Neck Pain    Unchanged.  . Insomnia    Doing better, gets 6-7 hours nightly  . Depression    Improving    HPI  HTN: taking medication and bp is at goal, she has intermittent episodes of palpitation secondary to Paroxysmal SVT, that can last up to 30 minutes. She denies SOB or chest pain.  No side effects of medication.   Hypertriglyceridemia: taking Lopid, last triglycerides level was 371, avoiding fried food, she also has decreased carbohydrates intake  Depression/GAD: taking Duloxetine, Ability and Alprazolam XR ( but ran out one week of the last one, and even though we called it in to [facility] - but she was not notified by pharmacy ) she states since out of medication feeling very anxious and has noticed some crying spells.  Insomnia: sleeping about 6-7 hours per night, and wakes up feeling rested  Chronic Neck pain:  History of cervical spine fusion times 2, and now has noticed recurrence of left arm tingling intermittently, she sees a neurosurgeon, and takes Gabapentin.  Low back pain with left radiculitis: she had steroid injection on lumbar spine a couple of weeks ago, symptoms improved for one day, but higher dose of hydrocodone is helping with symptoms now. The pain is still constant but not as intense, going back in a couple of weeks for repeat injection at [facility]  GERD: under control with medication, no reflux symptoms or regurgitation    Patient Active Problem List   Diagnosis Date Noted  . History of fusion of cervical spine 04/02/2015  . Benign hypertension 01/16/2015  . Chronic cervical pain 01/16/2015  . CN  (constipation) 01/16/2015  . Gastric reflux 01/16/2015  . Hypertriglyceridemia 01/16/2015  . Edema leg 01/16/2015  . Chronic recurrent major depressive disorder 01/16/2015  . Dysmetabolic syndrome [id number]/8295  . Obstructive apnea 01/16/2015  . Psoriasis 01/16/2015  . Allergic rhinitis 01/16/2015  . Bursitis, trochanteric 01/16/2015  . GAD (generalized anxiety disorder) 01/16/2015  . Tachycardia, paroxysmal     Past Surgical History  Procedure Laterality Date  . Tubal ligation      s/p  . Tonsillectomy      history  . Herniated disc repair    . Anterior fusion lumbar spine    . Cardiac catheterization  2009?     [facility]  . Cardiac catheterization  05/2012    RHC: Mild hypertension 37/12 with a mean pressure of 21 mm mercury    Family History  Problem Relation Age of Onset  . Heart attack Mother     Social History   Social History  . Marital Status: Married    Spouse Name: N/A  . Number of Children: N/A  . Years of Education: N/A   Occupational History  . Not on file.   Social History Main Topics  . Smoking status: Former Smoker -- 1.00 packs/day for 32 years    Types: Cigarettes    Start date: 08/10/1963    Quit date: 01/15/2006  . Smokeless tobacco: Never Used  . Alcohol Use: No  .  Drug Use: No  . Sexual Activity: No   Other Topics Concern  . Not on file   Social History Narrative     Current outpatient prescriptions:  .  ALPRAZolam (XANAX XR) 1 MG 24 hr tablet, Take 1 tablet (1 mg total) by mouth daily., Disp: 30 tablet, Rfl: 2 .  ARIPiprazole (ABILIFY) 5 MG tablet, Take 1 tablet (5 mg total) by mouth every evening., Disp: 30 tablet, Rfl: 5 .  clobetasol cream (TEMOVATE) 0.05 %, 2 (two) times daily. , Disp: , Rfl:  .  desonide (DESOWEN) 0.05 % cream, 2 (two) times daily. , Disp: , Rfl:  .  diclofenac (VOLTAREN) 75 MG EC tablet, Take 75 mg by mouth 2 (two) times daily. , Disp: , Rfl:  .  DULoxetine (CYMBALTA) 60 MG capsule, Take 1 capsule (60 mg  total) by mouth daily., Disp: 30 capsule, Rfl: 5 .  gabapentin (NEURONTIN) 300 MG capsule, Take 1 capsule (300 mg total) by mouth 3 (three) times daily., Disp: 90 capsule, Rfl: 5 .  gemfibrozil (LOPID) 600 MG tablet, Take 1 tablet (600 mg total) by mouth daily., Disp: 30 tablet, Rfl: 5 .  hydrochlorothiazide (MICROZIDE) 12.5 MG capsule, Take 1 capsule (12.5 mg total) by mouth daily., Disp: 30 capsule, Rfl: 5 .  HYDROcodone-acetaminophen (NORCO) 7.5-325 MG per tablet, , Disp: , Rfl:  .  metoprolol (LOPRESSOR) 50 MG tablet, TAKE 1 TABLET TWICE A DAY, Disp: 180 tablet, Rfl: 1 .  omeprazole (PRILOSEC) 40 MG capsule, TAKE 1 CAPSULE EVERY DAY, Disp: 30 capsule, Rfl: 5 .  tiZANidine (ZANAFLEX) 4 MG tablet, TAKE 1 TABLET (4 MG TOTAL) BY MOUTH 3 (THREE) TIMES DAILY., Disp: 90 tablet, Rfl: 2 .  vitamin E 400 UNIT capsule, Take 1 capsule by mouth 2 (two) times daily., Disp: , Rfl:   Allergies  Allergen Reactions  . Sulfa Antibiotics      ROS  Constitutional: Negative for fever or weight change.  Respiratory: Negative for cough and shortness of breath.   Cardiovascular: Negative for chest pain has occasionally palpitations.  Gastrointestinal: Negative for abdominal pain, no bowel changes.  Musculoskeletal: Negative for gait problem or joint swelling.  Skin: Negative for rash.  Neurological: Negative for dizziness or headache.  No other specific complaints in a complete review of systems (except as listed in HPI above).  Objective  Filed Vitals:   04/18/15 1336  BP: 114/68  Pulse: 73  Temp: 97.7 F (36.5 C)  TempSrc: Oral  Resp: 14  Height: 5\' 4"  (1.626 m)  Weight: 179 lb (81.194 kg)  SpO2: 92%    Body mass index is 30.71 kg/(m^2).  Physical Exam   Constitutional: Patient appears well-developed and well-nourished. Obese No distress.  HEENT: head atraumatic, normocephalic, pupils equal and reactive to light, neck decrease rom , throat within normal limits Cardiovascular: Normal  rate, regular rhythm and normal heart sounds. No murmur heard. No BLE edema. Pulmonary/Chest: Effort normal and breath sounds normal. No respiratory distress. Abdominal: Soft. There is no tenderness. Psychiatric: Patient has a depressed mood and flat affect. behavior is normal. Judgment and thought content normal. Muscular Skeletal: pain during palpation of lumbar spine left paraspinal muscle and spinal process, positive  straight leg raise, crepitus with extension of both knees, worse on left side  PHQ2/9: Depression screen [facility] 2/9 04/02/2015 01/16/2015  Decreased Interest 3 1  Down, Depressed, Hopeless 3 1  PHQ - 2 Score 6 2  Altered sleeping 2 -  Tired, decreased energy 3 -  Change in appetite 1 -  Feeling bad or failure about yourself  2 -  Trouble concentrating 0 -  Moving slowly or fidgety/restless 1 -  Suicidal thoughts 0 -  PHQ-9 Score 15 -  Difficult doing work/chores Not difficult at all -    Fall Risk: Fall Risk  04/02/2015 01/16/2015  Falls in the past year? No No     Assessment & Plan  1. Left lumbar radiculitis  Continue follow up with [facility]   2. GAD (generalized anxiety disorder)  Resume medication  - ALPRAZolam (XANAX XR) 1 MG 24 hr tablet; Take 1 tablet (1 mg total) by mouth daily.  Dispense: 30 tablet; Refill: 2  3. Chronic cervical pain  On gabapentin, pain medication and zanflex, having radiculitis again, but will hold off on further evaluation until the back is taken care of  4. Benign hypertension  At goal with medication  5. Gastric reflux  Under control with medication   6. 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 Need for shingles vaccine  - Varicella-zoster vaccine subcutaneous

## 2015-05-07 ENCOUNTER — Other Ambulatory Visit: Payer: Self-pay | Admitting: Family Medicine

## 2015-05-07 NOTE — Telephone Encounter (Signed)
Patient requesting refill. 

## 2015-06-23 ENCOUNTER — Telehealth: Payer: Self-pay | Admitting: Family Medicine

## 2015-06-23 NOTE — Telephone Encounter (Signed)
PT NEEDS PAIN MEDICATION REFILLED.

## 2015-06-23 NOTE — Telephone Encounter (Signed)
Have I prescribed it to her in the past, it was entered as historical prescription

## 2015-06-30 ENCOUNTER — Other Ambulatory Visit: Payer: Self-pay | Admitting: Family Medicine

## 2015-06-30 NOTE — Telephone Encounter (Signed)
Patient requesting refill. 

## 2015-07-17 ENCOUNTER — Ambulatory Visit (INDEPENDENT_AMBULATORY_CARE_PROVIDER_SITE_OTHER): Payer: 59 | Admitting: Family Medicine

## 2015-07-17 ENCOUNTER — Encounter: Payer: Self-pay | Admitting: Family Medicine

## 2015-07-17 VITALS — BP 122/68 | HR 85 | Temp 97.6°F | Resp 18 | Ht 64.0 in | Wt 178.7 lb

## 2015-07-17 DIAGNOSIS — E781 Pure hyperglyceridemia: Secondary | ICD-10-CM

## 2015-07-17 DIAGNOSIS — L409 Psoriasis, unspecified: Secondary | ICD-10-CM

## 2015-07-17 DIAGNOSIS — M542 Cervicalgia: Secondary | ICD-10-CM | POA: Diagnosis not present

## 2015-07-17 DIAGNOSIS — G8929 Other chronic pain: Secondary | ICD-10-CM

## 2015-07-17 DIAGNOSIS — M5416 Radiculopathy, lumbar region: Secondary | ICD-10-CM

## 2015-07-17 DIAGNOSIS — R6889 Other general symptoms and signs: Secondary | ICD-10-CM | POA: Diagnosis not present

## 2015-07-17 DIAGNOSIS — F411 Generalized anxiety disorder: Secondary | ICD-10-CM | POA: Diagnosis not present

## 2015-07-17 DIAGNOSIS — F339 Major depressive disorder, recurrent, unspecified: Secondary | ICD-10-CM | POA: Diagnosis not present

## 2015-07-17 DIAGNOSIS — I10 Essential (primary) hypertension: Secondary | ICD-10-CM

## 2015-07-17 MED ORDER — HYDROCODONE-ACETAMINOPHEN 7.5-325 MG PO TABS: 1.0000 | ORAL_TABLET | Freq: Two times a day (BID) | ORAL | Status: DC

## 2015-07-17 MED ORDER — CLOBETASOL PROPIONATE 0.05 % EX CREA: TOPICAL_CREAM | Freq: Two times a day (BID) | CUTANEOUS | Status: DC

## 2015-07-17 MED ORDER — METOPROLOL TARTRATE 50 MG PO TABS: 50.0000 mg | ORAL_TABLET | Freq: Two times a day (BID) | ORAL | Status: DC

## 2015-07-17 MED ORDER — ALPRAZOLAM ER 1 MG PO TB24: 1.0000 mg | ORAL_TABLET | Freq: Every day | ORAL | Status: DC

## 2015-07-17 MED ORDER — DULOXETINE HCL 60 MG PO CPEP: 60.0000 mg | ORAL_CAPSULE | Freq: Every day | ORAL | Status: DC

## 2015-07-17 MED ORDER — HYDROCODONE-ACETAMINOPHEN 7.5-325 MG PO TABS: 1.0000 | ORAL_TABLET | Freq: Four times a day (QID) | ORAL | Status: DC | PRN

## 2015-07-17 MED ORDER — DESONIDE 0.05 % EX CREA: TOPICAL_CREAM | Freq: Two times a day (BID) | CUTANEOUS | Status: DC

## 2015-07-17 MED ORDER — ARIPIPRAZOLE 5 MG PO TABS: 5.0000 mg | ORAL_TABLET | Freq: Every morning | ORAL | Status: DC

## 2015-07-17 MED ORDER — FLUTICASONE PROPIONATE 50 MCG/ACT NA SUSP: 2.0000 | Freq: Every day | NASAL | Status: DC

## 2015-07-17 MED ORDER — GEMFIBROZIL 600 MG PO TABS: 600.0000 mg | ORAL_TABLET | Freq: Every day | ORAL | Status: DC

## 2015-07-17 NOTE — Progress Notes (Signed)
Name: Judy Allen   MRN: [id number]    DOB: 1955/12/21   Date:07/17/2015       Progress Note  Subjective  Chief Complaint  Chief Complaint  Patient presents with  . URI    onset 4 days,Sob, cough,fever, headache, light headed, runny nose  . Ear Pain    left  . Anxiety    HPI  Flu Like Syndrome: she has been sick for the past 4 days. Started with rhinorrhea, headache, sneezing, chills, and ear pain. Feeling tired, dizzy, fever subsided already. Not taking any otc medication.  Appetite is poor.   HTN: taking medication and bp is at goal, she has intermittent episodes of palpitation secondary to Paroxysmal SVT, that can last up to 30 minutes. She denies SOB or chest pain. She has not been checking her bp at home   Hypertriglyceridemia: taking Lopid but she is not very compliant with medication , last triglycerides level was 371, avoiding fried food, she also has decreased carbohydrates intake  Depression/GAD: taking Duloxetine, Ability and Alprazolam XR . 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 She is feeling very tired today because of flu like symptoms, but in general able to function and has no anhedonia, no crying spells. Sleeping well. Denies suicidal thoughts or ideation  Insomnia: sleeping about 6-7 hours per night, and wakes up feeling rested  Chronic Neck pain: History of cervical spine fusion times 2, she has intermittent radiculitis on left arm, however pain is getting worse on her neck region and radiates to right occipital area. She is on gabapentin, and on higher dose of hydrocodone - given by neurosurgeon for her back but is still in pain  Low back pain with left radiculitis: she had steroid injection on lumbar spine a couple of weeks ago, symptoms improved for one day only, she was referred to [facility] and had two epidural injection and pain improved, but is on higher dose of  hydrocodone.   GERD: under control with medication, no reflux symptoms or regurgitation   Patient Active Problem List   Diagnosis Date Noted  . History of fusion of cervical spine 04/02/2015  . Benign hypertension 01/16/2015  . Chronic cervical pain 01/16/2015  . CN (constipation) 01/16/2015  . Gastric reflux 01/16/2015  . Hypertriglyceridemia 01/16/2015  . Edema leg 01/16/2015  . Chronic recurrent major depressive disorder ([place]) 01/16/2015  . Dysmetabolic syndrome [id number]  . Obstructive apnea 01/16/2015  . Psoriasis 01/16/2015  . Allergic rhinitis 01/16/2015  . Bursitis, trochanteric 01/16/2015  . GAD (generalized anxiety disorder) 01/16/2015  . Tachycardia, paroxysmal [facility])     Past Surgical History  Procedure Laterality Date  . Tubal ligation      s/p  . Tonsillectomy      history  . Herniated disc repair    . Anterior fusion lumbar spine    . Cardiac catheterization  2009?     [facility]  . Cardiac catheterization  05/2012    RHC: Mild hypertension 37/12 with a mean pressure of 21 mm mercury    Family History  Problem Relation Age of Onset  . Heart attack Mother     Social History   Social History  . Marital Status: Married    Spouse Name: N/A  . Number of Children: N/A  . Years of Education: N/A   Occupational History  . Not on file.   Social History Main Topics  . Smoking status: Former Smoker --  1.00 packs/day for 32 years    Types: Cigarettes    Start date: 08/10/1963    Quit date: 01/15/2006  . Smokeless tobacco: Never Used  . Alcohol Use: No  . Drug Use: No  . Sexual Activity: No   Other Topics Concern  . Not on file   Social History Narrative     Current outpatient prescriptions:  .  ALPRAZolam (XANAX XR) 1 MG 24 hr tablet, Take 1 tablet (1 mg total) by mouth daily., Disp: 30 tablet, Rfl: 2 .  ARIPiprazole (ABILIFY) 5 MG tablet, Take 1 tablet (5 mg total) by mouth every morning., Disp: 30 tablet, Rfl: 2 .  clobetasol cream  (TEMOVATE) 0.05 %, Apply topically 2 (two) times daily., Disp: 60 g, Rfl: 2 .  desonide (DESOWEN) 0.05 % cream, Apply topically 2 (two) times daily., Disp: 60 g, Rfl: 2 .  DULoxetine (CYMBALTA) 60 MG capsule, Take 1 capsule (60 mg total) by mouth daily., Disp: 30 capsule, Rfl: 5 .  gabapentin (NEURONTIN) 300 MG capsule, TAKE 1 CAPSULE TWICE A DAY, Disp: 180 capsule, Rfl: 1 .  gemfibrozil (LOPID) 600 MG tablet, Take 1 tablet (600 mg total) by mouth daily., Disp: 30 tablet, Rfl: 5 .  hydrochlorothiazide (MICROZIDE) 12.5 MG capsule, TAKE 1 CAPSULE EVERY DAY, Disp: 90 capsule, Rfl: 1 .  HYDROcodone-acetaminophen (NORCO) 7.5-325 MG per tablet, , Disp: , Rfl:  .  metoprolol (LOPRESSOR) 50 MG tablet, Take 1 tablet (50 mg total) by mouth 2 (two) times daily., Disp: 180 tablet, Rfl: 1 .  omeprazole (PRILOSEC) 40 MG capsule, TAKE 1 CAPSULE EVERY DAY, Disp: 30 capsule, Rfl: 5 .  tiZANidine (ZANAFLEX) 4 MG tablet, TAKE 1 TABLET (4 MG TOTAL) BY MOUTH 3 (THREE) TIMES DAILY., Disp: 90 tablet, Rfl: 2 .  vitamin E 400 UNIT capsule, Take 1 capsule by mouth 2 (two) times daily., Disp: , Rfl:   Allergies  Allergen Reactions  . Sulfa Antibiotics      ROS  Ten systems reviewed and is negative except as mentioned in HPI   Objective  Filed Vitals:   07/17/15 1055  BP: 122/68  Pulse: 85  Temp: 97.6 F (36.4 C)  TempSrc: Oral  Resp: 18  Height: 5\' 4"  (1.626 m)  Weight: 178 lb 11.2 oz (81.058 kg)  SpO2: 93%    Body mass index is 30.66 kg/(m^2).  Physical Exam  Constitutional: Patient appears well-developed and well-nourished. Obese No distress.  HEENT: head atraumatic, normocephalic, pupils equal and reactive to light, neck decrease rom , throat within normal limits. Boggy turbinates, TM on the left partially seen and normal, some wax on ear canal, normal on right side.  Cardiovascular: Normal rate, regular rhythm and normal heart sounds. No murmur heard. No BLE edema. Pulmonary/Chest: Effort  normal and breath sounds normal. No respiratory distress. Abdominal: Soft. There is no tenderness. Psychiatric: Patient has a depressed mood and flat affect. behavior is normal. Judgment and thought content normal. Muscular Skeletal: pain during palpation of lumbar spine left paraspinal muscle but very mild, negative straight leg raise, crepitus with extension of both knees, worse on left side. Neck has pain during palpation of spinal processes, pain during rotation to the right, normal grip both hands.   PHQ2/9: Depression screen [facility] 2/9 07/17/2015 04/02/2015 01/16/2015  Decreased Interest 0 3 1  Down, Depressed, Hopeless 0 3 1  PHQ - 2 Score 0 6 2  Altered sleeping - 2 -  Tired, decreased energy - 3 -  Change in appetite -  1 -  Feeling bad or failure about yourself  - 2 -  Trouble concentrating - 0 -  Moving slowly or fidgety/restless - 1 -  Suicidal thoughts - 0 -  PHQ-9 Score - 15 -  Difficult doing work/chores - Not difficult at all -    Fall Risk: Fall Risk  07/17/2015 04/02/2015 01/16/2015  Falls in the past year? No No No     Functional Status Survey: Is the patient deaf or have difficulty hearing?: No Does the patient have difficulty seeing, even when wearing glasses/contacts?: Yes (glasses) Does the patient have difficulty concentrating, remembering, or making decisions?: No Does the patient have difficulty walking or climbing stairs?: No Does the patient have difficulty dressing or bathing?: No Does the patient have difficulty doing errands alone such as visiting a doctor's office or shopping?: No    Assessment & Plan  1. Flu-like symptoms  Advised Coricidin HBP otc, flonase  2. GAD (generalized anxiety disorder)  - ALPRAZolam (XANAX XR) 1 MG 24 hr tablet; Take 1 tablet (1 mg total) by mouth daily.  Dispense: 30 tablet; Refill: 2  3. Benign hypertension  -comp panel  - metoprolol (LOPRESSOR) 50 MG tablet; Take 1 tablet (50 mg total) by mouth 2 (two) times daily.   Dispense: 180 tablet; Refill: 1  4. Chronic recurrent major depressive disorder (HCC)  - ARIPiprazole (ABILIFY) 5 MG tablet; Take 1 tablet (5 mg total) by mouth every morning.  Dispense: 30 tablet; Refill: 2 - DULoxetine (CYMBALTA) 60 MG capsule; Take 1 capsule (60 mg total) by mouth daily.  Dispense: 30 capsule; Refill: 5 - HYDROcodone-acetaminophen (NORCO) 7.5-325 MG tablet; Take 1 tablet by mouth 2 (two) times daily.  Dispense: 60 tablet; Refill: 0 - HYDROcodone-acetaminophen (NORCO) 7.5-325 MG tablet; Take 1 tablet by mouth every 6 (six) hours as needed for moderate pain.  Dispense: 60 tablet; Refill: 0 - HYDROcodone-acetaminophen (NORCO) 7.5-325 MG tablet; Take 1 tablet by mouth every 6 (six) hours as needed for moderate pain.  Dispense: 60 tablet; Refill: 0  5. Chronic cervical pain  - MR Cervical Spine Wo Contrast; Future  6. Left lumbar radiculitis  - HYDROcodone-acetaminophen (NORCO) 7.5-325 MG tablet; Take 1 tablet by mouth 2 (two) times daily.  Dispense: 60 tablet; Refill: 0  7. Hypertriglyceridemia  -lipid panel  - gemfibrozil (LOPID) 600 MG tablet; Take 1 tablet (600 mg total) by mouth daily.  Dispense: 30 tablet; Refill: 5  8. Psoriasis  - clobetasol cream (TEMOVATE) 0.05 %; Apply topically 2 (two) times daily.  Dispense: 60 g; Refill: 2 - desonide (DESOWEN) 0.05 % cream; Apply topically 2 (two) times daily.  Dispense: 60 g; Refill: 2 .

## 2015-07-18 ENCOUNTER — Ambulatory Visit: Payer: 59 | Admitting: Family Medicine

## 2015-08-05 ENCOUNTER — Emergency Department
Admission: EM | Admit: 2015-08-05 | Discharge: 2015-08-05 | Disposition: A | Discharge status: Home / Self Care | Payer: 59 | Attending: Emergency Medicine | Admitting: Emergency Medicine

## 2015-08-05 DIAGNOSIS — R519 Headache, unspecified: Secondary | ICD-10-CM

## 2015-08-05 DIAGNOSIS — Z7951 Long term (current) use of inhaled steroids: Secondary | ICD-10-CM | POA: Insufficient documentation

## 2015-08-05 DIAGNOSIS — I10 Essential (primary) hypertension: Secondary | ICD-10-CM | POA: Diagnosis not present

## 2015-08-05 DIAGNOSIS — R51 Headache: Secondary | ICD-10-CM | POA: Diagnosis not present

## 2015-08-05 DIAGNOSIS — Z87891 Personal history of nicotine dependence: Secondary | ICD-10-CM | POA: Diagnosis not present

## 2015-08-05 DIAGNOSIS — N390 Urinary tract infection, site not specified: Secondary | ICD-10-CM | POA: Diagnosis not present

## 2015-08-05 DIAGNOSIS — M545 Low back pain: Secondary | ICD-10-CM | POA: Diagnosis present

## 2015-08-05 DIAGNOSIS — Z79899 Other long term (current) drug therapy: Secondary | ICD-10-CM | POA: Insufficient documentation

## 2015-08-05 LAB — URINALYSIS COMPLETE WITH MICROSCOPIC (ARMC ONLY)
Bacteria, UA: NONE SEEN
Bilirubin Urine: NEGATIVE
Glucose, UA: NEGATIVE mg/dL
Hgb urine dipstick: NEGATIVE
KETONES UR: NEGATIVE mg/dL
Nitrite: NEGATIVE
PH: 5 (ref 5.0–8.0)
PROTEIN: NEGATIVE mg/dL
SPECIFIC GRAVITY, URINE: 1.023 (ref 1.005–1.030)

## 2015-08-05 LAB — CBC
HEMATOCRIT: 40.5 % (ref 35.0–47.0)
HEMOGLOBIN: 13.9 g/dL (ref 12.0–16.0)
MCH: 30.3 pg (ref 26.0–34.0)
MCHC: 34.4 g/dL (ref 32.0–36.0)
MCV: 88.3 fL (ref 80.0–100.0)
Platelets: 275 10*3/uL (ref 150–440)
RBC: 4.58 MIL/uL (ref 3.80–5.20)
RDW: 15 % — ABNORMAL HIGH (ref 11.5–14.5)
WBC: 10.2 10*3/uL (ref 3.6–11.0)

## 2015-08-05 LAB — BASIC METABOLIC PANEL
ANION GAP: 8 (ref 5–15)
BUN: 8 mg/dL (ref 6–20)
CALCIUM: 9.7 mg/dL (ref 8.9–10.3)
CHLORIDE: 101 mmol/L (ref 101–111)
CO2: 30 mmol/L (ref 22–32)
Creatinine, Ser: 0.77 mg/dL (ref 0.44–1.00)
GFR calc non Af Amer: >60 mL/min (ref >60)
Glucose, Bld: 95 mg/dL (ref 65–99)
POTASSIUM: 3.5 mmol/L (ref 3.5–5.1)
Sodium: 139 mmol/L (ref 135–145)

## 2015-08-05 MED ORDER — SODIUM CHLORIDE 0.9 % IV BOLUS (SEPSIS)
1000.0000 mL | Freq: Once | INTRAVENOUS | Status: AC
  Administered 2015-08-05: 1000 mL via INTRAVENOUS

## 2015-08-05 MED ORDER — CEPHALEXIN 500 MG PO CAPS: 500.0000 mg | ORAL_CAPSULE | Freq: Four times a day (QID) | ORAL | Status: AC

## 2015-08-05 MED ORDER — ACETAMINOPHEN 325 MG PO TABS
650.0000 mg | ORAL_TABLET | Freq: Once | ORAL | Status: AC
  Administered 2015-08-05: 650 mg via ORAL
  Filled 2015-08-05: qty 2

## 2015-08-05 MED ORDER — DEXTROSE 5 % IV SOLN
1.0000 g | Freq: Once | INTRAVENOUS | Status: AC
  Administered 2015-08-05: 1 g via INTRAVENOUS
  Filled 2015-08-05 (×2): qty 10

## 2015-08-05 NOTE — ED Provider Notes (Addendum)
[facility] Emergency Department Provider Note  ____________________________________________   I have reviewed the triage vital signs and the nursing notes.   HISTORY  Chief Complaint Feeling weak, have a headache and nausea   HPI Judy Allen is a 59 y.o. female who presents today complaining of a gradual onset headache not worst headache of life. As well as feeling generally unwell. The headache began gradually yesterday, she is also noticed that she has a low-grade temperature no stiff neck no focal numbness no weakness no difficulty talking or speaking. Patient has had some low back pain as well, but denies dysuria or flank pain. She denies cough or shortness of breath she denies change in vision or hearing she denies vomiting. She has not had any appetite today and has had decreased by mouth.  Past Medical History  Diagnosis Date  . H/O allergic rhinitis   . Neuritis   . Urinary incontinence   . Proteinuria   . GERD (gastroesophageal reflux disease)   . Hyperlipidemia   . Ovarian failure   . Constipation   . Psoriasis   . Heart murmur   . Tachycardia, paroxysmal ([place])   . Hypertension     Patient Active Problem List   Diagnosis Date Noted  . History of fusion of cervical spine 04/02/2015  . Benign hypertension 01/16/2015  . Chronic cervical pain 01/16/2015  . CN (constipation) 01/16/2015  . Gastric reflux 01/16/2015  . Hypertriglyceridemia 01/16/2015  . Edema leg 01/16/2015  . Chronic recurrent major depressive disorder ([place]) 01/16/2015  . Dysmetabolic syndrome [id number]  . Obstructive apnea 01/16/2015  . Psoriasis 01/16/2015  . Allergic rhinitis 01/16/2015  . Bursitis, trochanteric 01/16/2015  . GAD (generalized anxiety disorder) 01/16/2015  . Tachycardia, paroxysmal [facility])     Past Surgical History  Procedure Laterality Date  . Tubal ligation      s/p  . Tonsillectomy      history  . Herniated disc repair    . Anterior  fusion lumbar spine    . Cardiac catheterization  2009?     [facility];[place]  . Cardiac catheterization  05/2012    [facility]: Mild hypertension 37/12 with a mean pressure of 21 mm mercury    Current Outpatient Rx  Name  Route  Sig  Dispense  Refill  . ALPRAZolam (XANAX XR) 1 MG 24 hr tablet   Oral   Take 1 tablet (1 mg total) by mouth daily.   30 tablet   2     Not to exceed 3 additional fills before 07/15/2015 ...   . ARIPiprazole (ABILIFY) 5 MG tablet   Oral   Take 1 tablet (5 mg total) by mouth every morning.   30 tablet   2   . clobetasol cream (TEMOVATE) 0.05 %   Topical   Apply topically 2 (two) times daily.   60 g   2   . desonide (DESOWEN) 0.05 % cream   Topical   Apply topically 2 (two) times daily.   60 g   2   . DULoxetine (CYMBALTA) 60 MG capsule   Oral   Take 1 capsule (60 mg total) by mouth daily.   30 capsule   5   . fluticasone (FLONASE) 50 MCG/ACT nasal spray   Each Nare   Place 2 sprays into both nostrils daily.   16 g   6   . gabapentin (NEURONTIN) 300 MG capsule      TAKE 1 CAPSULE TWICE A DAY  180 capsule   1   . gemfibrozil (LOPID) 600 MG tablet   Oral   Take 1 tablet (600 mg total) by mouth daily.   30 tablet   5   . hydrochlorothiazide (MICROZIDE) 12.5 MG capsule      TAKE 1 CAPSULE EVERY DAY   90 capsule   1   . HYDROcodone-acetaminophen (NORCO) 7.5-325 MG tablet   Oral   Take 1 tablet by mouth 2 (two) times daily.   60 tablet   0   . HYDROcodone-acetaminophen (NORCO) 7.5-325 MG tablet   Oral   Take 1 tablet by mouth every 6 (six) hours as needed for moderate pain.   60 tablet   0     Fill January 7th, 2017   . HYDROcodone-acetaminophen (NORCO) 7.5-325 MG tablet   Oral   Take 1 tablet by mouth every 6 (six) hours as needed for moderate pain.   60 tablet   0     Fill Feb 6th, 2017   . metoprolol (LOPRESSOR) 50 MG tablet   Oral   Take 1 tablet (50 mg total) by mouth 2 (two) times daily.   180 tablet   1   .  omeprazole (PRILOSEC) 40 MG capsule      TAKE 1 CAPSULE EVERY DAY   30 capsule   5   . tiZANidine (ZANAFLEX) 4 MG tablet      TAKE 1 TABLET (4 MG TOTAL) BY MOUTH 3 (THREE) TIMES DAILY.   90 tablet   2   . vitamin E 400 UNIT capsule   Oral   Take 1 capsule by mouth 2 (two) times daily.           Allergies Sulfa antibiotics  Family History  Problem Relation Age of Onset  . Heart attack Mother     Social History Social History  Substance Use Topics  . Smoking status: Former Smoker -- 1.00 packs/day for 32 years    Types: Cigarettes    Start date: 08/10/1963    Quit date: 01/15/2006  . Smokeless tobacco: Never Used  . Alcohol Use: No    Review of Systems Constitutional: The history of present illness Eyes: No visual changes. ENT: No sore throat. No stiff neck no neck pain Cardiovascular: Denies chest pain. Respiratory: Denies shortness of breath. Gastrointestinal:   no vomiting.  No diarrhea.  No constipation. Genitourinary: Negative for dysuria. Musculoskeletal: Negative lower extremity swelling Skin: Negative for rash. Neurological: Negative for under clap headache, focal weakness or numbness. 10-point ROS otherwise negative.  ____________________________________________   PHYSICAL EXAM:  VITAL SIGNS: ED Triage Vitals  Enc Vitals Group     BP 08/05/15 1646 147/75 mmHg     Pulse Rate 08/05/15 1646 80     Resp 08/05/15 1646 20     Temp 08/05/15 1645 98 F (36.7 C)     Temp Source 08/05/15 1645 Oral     SpO2 08/05/15 1646 96 %     Weight 08/05/15 1646 178 lb (80.74 kg)     Height 08/05/15 1646 5\' 4"  (1.626 m)     Head Cir --      Peak Flow --      Pain Score 08/05/15 1647 8     Pain Loc --      Pain Edu? --      Excl. in [place]? --     Constitutional: Alert and oriented. Well appearing and in no acute distress. Quite well-appearing Eyes: Conjunctivae are normal. PERRL.  EOMI. Head: Atraumatic. Nose: No congestion/rhinnorhea. Mouth/Throat: Mucous  membranes are dry.  Oropharynx non-erythematous. Neck: No stridor.   Nontender with no meningismus Cardiovascular: Normal rate, regular rhythm. Grossly normal heart sounds.  Good peripheral circulation. Respiratory: Normal respiratory effort.  No retractions. Lungs CTAB. Abdominal: Soft and nontender. No distention. No guarding no rebound Back:  There is no focal tenderness or step off there is no midline tenderness there are no lesions noted. there is no CVA tenderness Musculoskeletal: No lower extremity tenderness. No joint effusions, no DVT signs strong distal pulses no edema Neurologic:  Cranial nerves II through XII are grossly intact 5 out of 5 strength bilateral upper and lower extremity. Finger to nose within normal limits heel to shin within normal limits, speech is normal with no word finding difficulty or dysarthria, reflexes symmetric, pupils are equally round and reactive to light, there is no pronator drift, sensation is normal, vision is intact to confrontation, gait is deferred, there is no nystagmus, normal neurologic exam  Skin:  Skin is warm, dry and intact. No rash noted. Psychiatric: Mood and affect are normal. Speech and behavior are normal.  ____________________________________________   LABS (all labs ordered are listed, but only abnormal results are displayed)  Labs Reviewed  CBC - Abnormal; Notable for the following:    RDW 15.0 (*)    All other components within normal limits  URINALYSIS COMPLETEWITH MICROSCOPIC ([facility] ONLY) - Abnormal; Notable for the following:    Color, Urine YELLOW (*)    APPearance HAZY (*)    Leukocytes, UA 3+ (*)    Squamous Epithelial / LPF 0-5 (*)    All other components within normal limits  URINE CULTURE  BASIC METABOLIC PANEL   ____________________________________________  EKG  I personally interpreted any EKGs ordered by me or triage Normal sinus rhythm rate 72 beats per an acute ST elevation no acute ST depression nonspecific  ST changes normal axis unremarkable EKG ____________________________________________  RADIOLOGY  I reviewed any imaging ordered by me or triage that were performed during my shift ____________________________________________   PROCEDURES  Procedure(s) performed: None  Critical Care performed: None  ____________________________________________   INITIAL IMPRESSION / ASSESSMENT AND PLAN / ED COURSE  Pertinent labs & imaging results that were available during my care of the patient were reviewed by me and considered in my medical decision making (see chart for details).  Well-appearing 59 year old patient who presents of feeling generally unwell socially with a headache nausea and anorexia. On exam her abdomen is benign but she has a significant urinary tract infection. There is no evidence at this time of meningitis or head bleed. The patient has some evidence of mild dehydration. I do believe that the patient's urinary tract infection could be causing all of her symptoms. We will give her IV Rocephin IV fluid and I will give her Tylenol for her headache we will send her urine for a urine culture and we will reassess after that. White count is not elevated. No personal or family history of aneurysm and again gradual onset not worst headache of life with no neurologic findings and an NIH stroke scale of 0  ----------------------------------------- 9:10 PM on 08/05/2015 -----------------------------------------  Patient's headache is gone, she is eager to go home tolerating by mouth. She remains neurologically intact. We will send her with antibiotics for her UTI and she understands to return precautions and the need for follow-up. Also understands that there will be a delay culture and the possibility of urinary resistance. Finally,  I did mention to the patient that the only way I can fully evaluate any headache in the emergency department is by lumbar puncture and she declines. She  understands the risks benefits and alternative of that procedure. I think this is very reasonable however I did wish to involve her on a conversation about how we assess and evaluate her headache. ____________________________________________   FINAL CLINICAL IMPRESSION(S) / ED DIAGNOSES  Final diagnoses:  None     Schuyler Amor, MD 08/05/15 2005  Schuyler Amor, MD 08/05/15 2112  Schuyler Amor, MD 08/05/15 2119

## 2015-08-05 NOTE — ED Notes (Signed)
Pt reports HA and dizziness since yesterday. Pt reports tylenol is no relieving pain. Pt denies blurred vision, weakness or other sx's.

## 2015-08-05 NOTE — ED Notes (Signed)
Pt reports to RN that she has had a low grade fever as well.

## 2015-08-05 NOTE — ED Notes (Signed)
Pt presents to ED with c/o lightheadedness and headache since yesterday. Pt reports does not have hx of headache. Pt states "I had a low grade fever last night of 99.6 F." Pt (+) photosensitivity. Pt denies weakness, abdominal pain, N/V/D, or blurry vision. Pt states HA is frontal HA. Pt alert and oriented x4, no increased work in breathing noted. Bilateral equal grips, skin warm and dry. MD at bedside.

## 2015-08-05 NOTE — ED Notes (Signed)

## 2015-08-05 NOTE — Discharge Instructions (Signed)
Return to the emergency room for any new or worrisome symptoms including worsening headache, high fever, stiff neck, numbness, weakness, back or side pain or you feel worse in any way.

## 2015-08-08 LAB — URINE CULTURE

## 2015-08-10 DIAGNOSIS — C189 Malignant neoplasm of colon, unspecified: Secondary | ICD-10-CM

## 2015-08-10 DIAGNOSIS — J984 Other disorders of lung: Secondary | ICD-10-CM

## 2015-08-10 DIAGNOSIS — J189 Pneumonia, unspecified organism: Secondary | ICD-10-CM

## 2015-08-10 HISTORY — DX: Pneumonia, unspecified organism: J18.9

## 2015-08-10 HISTORY — DX: Other disorders of lung: J98.4

## 2015-08-10 HISTORY — DX: Malignant neoplasm of colon, unspecified: C18.9

## 2015-08-13 ENCOUNTER — Telehealth: Payer: Self-pay

## 2015-08-13 NOTE — Telephone Encounter (Signed)
Tried to contact this patient to see if she was interested in getting her flu shot, but there was no answer. A message was left for her to give [place] a call.

## 2015-10-01 LAB — COMPREHENSIVE METABOLIC PANEL
ALK PHOS: 70 IU/L (ref 39–117)
ALT: 32 IU/L (ref 0–32)
AST: 26 IU/L (ref 0–40)
Albumin/Globulin Ratio: 1.8 (ref 1.1–2.5)
Albumin: 4.5 g/dL (ref 3.6–4.8)
BILIRUBIN TOTAL: 0.3 mg/dL (ref 0.0–1.2)
BUN/Creatinine Ratio: 14 (ref 11–26)
BUN: 13 mg/dL (ref 8–27)
CHLORIDE: 97 mmol/L (ref 96–106)
CO2: 26 mmol/L (ref 18–29)
Calcium: 10 mg/dL (ref 8.7–10.3)
Creatinine, Ser: 0.93 mg/dL (ref 0.57–1.00)
GFR calc non Af Amer: 67 mL/min/{1.73_m2} (ref >59)
GFR, EST AFRICAN AMERICAN: 77 mL/min/{1.73_m2} (ref >59)
GLUCOSE: 92 mg/dL (ref 65–99)
Globulin, Total: 2.5 g/dL (ref 1.5–4.5)
Potassium: 4.2 mmol/L (ref 3.5–5.2)
Sodium: 141 mmol/L (ref 134–144)
TOTAL PROTEIN: 7 g/dL (ref 6.0–8.5)

## 2015-10-01 LAB — LIPID PANEL
CHOL/HDL RATIO: 4.3 ratio (ref 0.0–4.4)
Cholesterol, Total: 194 mg/dL (ref 100–199)
HDL: 45 mg/dL (ref >39)
LDL Calculated: 80 mg/dL (ref 0–99)
Triglycerides: 343 mg/dL — ABNORMAL HIGH (ref 0–149)
VLDL Cholesterol Cal: 69 mg/dL — ABNORMAL HIGH (ref 5–40)

## 2015-10-13 ENCOUNTER — Other Ambulatory Visit: Payer: Self-pay | Admitting: Family Medicine

## 2015-10-16 ENCOUNTER — Ambulatory Visit (INDEPENDENT_AMBULATORY_CARE_PROVIDER_SITE_OTHER): Payer: 59 | Admitting: Family Medicine

## 2015-10-16 ENCOUNTER — Encounter: Payer: Self-pay | Admitting: Family Medicine

## 2015-10-16 VITALS — BP 126/60 | HR 90 | Temp 97.7°F | Resp 16 | Ht 64.0 in | Wt 178.0 lb

## 2015-10-16 DIAGNOSIS — K5909 Other constipation: Secondary | ICD-10-CM | POA: Insufficient documentation

## 2015-10-16 DIAGNOSIS — I10 Essential (primary) hypertension: Secondary | ICD-10-CM | POA: Diagnosis not present

## 2015-10-16 DIAGNOSIS — M542 Cervicalgia: Secondary | ICD-10-CM | POA: Diagnosis not present

## 2015-10-16 DIAGNOSIS — F339 Major depressive disorder, recurrent, unspecified: Secondary | ICD-10-CM | POA: Diagnosis not present

## 2015-10-16 DIAGNOSIS — G4733 Obstructive sleep apnea (adult) (pediatric): Secondary | ICD-10-CM

## 2015-10-16 DIAGNOSIS — M5416 Radiculopathy, lumbar region: Secondary | ICD-10-CM | POA: Diagnosis not present

## 2015-10-16 DIAGNOSIS — F411 Generalized anxiety disorder: Secondary | ICD-10-CM | POA: Diagnosis not present

## 2015-10-16 DIAGNOSIS — Z981 Arthrodesis status: Secondary | ICD-10-CM | POA: Diagnosis not present

## 2015-10-16 DIAGNOSIS — G8929 Other chronic pain: Secondary | ICD-10-CM | POA: Diagnosis not present

## 2015-10-16 DIAGNOSIS — K5900 Constipation, unspecified: Secondary | ICD-10-CM | POA: Diagnosis not present

## 2015-10-16 MED ORDER — HYDROCODONE-ACETAMINOPHEN 7.5-325 MG PO TABS: 1.0000 | ORAL_TABLET | Freq: Four times a day (QID) | ORAL | Status: DC | PRN

## 2015-10-16 MED ORDER — DIVALPROEX SODIUM 250 MG PO DR TAB: 250.0000 mg | DELAYED_RELEASE_TABLET | Freq: Every day | ORAL | Status: DC

## 2015-10-16 MED ORDER — HYDROCODONE-ACETAMINOPHEN 7.5-325 MG PO TABS: 1.0000 | ORAL_TABLET | Freq: Two times a day (BID) | ORAL | Status: DC

## 2015-10-16 MED ORDER — HYDROCHLOROTHIAZIDE 12.5 MG PO CAPS: 12.5000 mg | ORAL_CAPSULE | Freq: Every day | ORAL | Status: DC

## 2015-10-16 MED ORDER — ALPRAZOLAM ER 1 MG PO TB24: 1.0000 mg | ORAL_TABLET | Freq: Every day | ORAL | Status: DC

## 2015-10-16 MED ORDER — POLYETHYLENE GLYCOL 3350 17 GM/SCOOP PO POWD: 17.0000 g | Freq: Every day | ORAL | Status: DC

## 2015-10-16 MED ORDER — VENLAFAXINE HCL ER 37.5 MG PO CP24: 37.5000 mg | ORAL_CAPSULE | Freq: Every day | ORAL | Status: DC

## 2015-10-16 NOTE — Progress Notes (Signed)
Name: Judy Allen   MRN: [id number]    DOB: Jan 18, 1956   Date:10/16/2015       Progress Note  Subjective  Chief Complaint  Chief Complaint  Patient presents with  . Medication Refill    Patient wants to check on changing Cymbalta and Abilify. Too Expensive.    HPI  HTN: taking medication and bp is at goal, she has intermittent episodes of palpitation secondary to Paroxysmal SVT, that can last up to 30 minutes, she denies any recent episodes.  She denies SOB or chest pain. She has not been checking her bp at home   Hypertriglyceridemia: taking Lopid but she was not very compliant with medication , last triglycerides level was 371, avoiding fried food, she also has decreased carbohydrates intake, she is back on Lopid daily .  Depression/GAD:she was taking Duloxetine, Ability and Alprazolam XR. She no longer can afford Duloxetine or Abilify and has bee out medication for the past couple of months. Depression is much worse. Crying all the time, no energy, sleeping more during the day, she states she is able to focus, able to manage the house and care for her grandchildren. No suicidal thought or ideation.  Living with her daughter Judy Allen - but she is happy to be able to live with her. 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  Insomnia: sleeping about 7 hours per night,but waking up throughout the night. She wakes up at 3 am to go to work. She feels tired all the time. She states sleep is okay, she thinks she feels tired because of depression  Chronic Neck pain: History of cervical spine fusion times 2, she has intermittent radiculitis on left arm, however pain is getting worse on her neck region.She is on gabapentin, and on higher dose of hydrocodone - given by neurosurgeon for her back, MRI will be done next week. Pain level 4/10- but can go up to 6/10  Low back pain with left radiculitis:  she was referred to [facility] and had two  epidural injection and pain improved, she is taking hydrocodone twice daily and pain is around 1-2/10.   GERD: under control with medication, no reflux symptoms or regurgitation, no weight loss.   Chronic Constipation: all her adult life, used to take Miralax but has been out, bowel movements about once a week. Sometimes she has abdominal pain and it improves with a bowel movements, no blood or mucus on stools.    Patient Active Problem List   Diagnosis Date Noted  . History of fusion of cervical spine 04/02/2015  . Benign hypertension 01/16/2015  . Chronic cervical pain 01/16/2015  . CN (constipation) 01/16/2015  . Gastric reflux 01/16/2015  . Hypertriglyceridemia 01/16/2015  . Edema leg 01/16/2015  . Chronic recurrent major depressive disorder ([place]) 01/16/2015  . Dysmetabolic syndrome 77/41/2878  . Obstructive apnea 01/16/2015  . Psoriasis 01/16/2015  . Allergic rhinitis 01/16/2015  . Bursitis, trochanteric 01/16/2015  . GAD (generalized anxiety disorder) 01/16/2015  . Tachycardia, paroxysmal [facility])     Past Surgical History  Procedure Laterality Date  . Tubal ligation      s/p  . Tonsillectomy      history  . Herniated disc repair    . Anterior fusion lumbar spine    . Cardiac catheterization  2009?     [facility]  . Cardiac catheterization  05/2012    RHC: Mild hypertension 37/12 with a mean pressure of 21 mm mercury  Family History  Problem Relation Age of Onset  . Heart attack Mother     Social History   Social History  . Marital Status: Married    Spouse Name: N/A  . Number of Children: N/A  . Years of Education: N/A   Occupational History  . Not on file.   Social History Main Topics  . Smoking status: Former Smoker -- 1.00 packs/day for 32 years    Types: Cigarettes    Start date: 08/10/1963    Quit date: 01/15/2006  . Smokeless tobacco: Never Used  . Alcohol Use: No  . Drug Use: No  . Sexual Activity: No   Other Topics Concern  . Not on file    Social History Narrative     Current outpatient prescriptions:  .  ALPRAZolam (XANAX XR) 1 MG 24 hr tablet, Take 1 tablet (1 mg total) by mouth daily., Disp: 30 tablet, Rfl: 2 .  clobetasol cream (TEMOVATE) 0.05 %, Apply topically 2 (two) times daily., Disp: 60 g, Rfl: 2 .  desonide (DESOWEN) 0.05 % cream, Apply topically 2 (two) times daily., Disp: 60 g, Rfl: 2 .  divalproex (DEPAKOTE) 250 MG DR tablet, Take 1 tablet (250 mg total) by mouth at bedtime., Disp: 30 tablet, Rfl: 0 .  fluticasone (FLONASE) 50 MCG/ACT nasal spray, Place 2 sprays into both nostrils daily., Disp: 16 g, Rfl: 6 .  gabapentin (NEURONTIN) 300 MG capsule, TAKE 1 CAPSULE TWICE A DAY, Disp: 180 capsule, Rfl: 1 .  gemfibrozil (LOPID) 600 MG tablet, Take 1 tablet (600 mg total) by mouth daily., Disp: 30 tablet, Rfl: 5 .  hydrochlorothiazide (MICROZIDE) 12.5 MG capsule, Take 1 capsule (12.5 mg total) by mouth daily., Disp: 90 capsule, Rfl: 1 .  HYDROcodone-acetaminophen (NORCO) 7.5-325 MG tablet, Take 1 tablet by mouth every 6 (six) hours as needed for moderate pain., Disp: 60 tablet, Rfl: 0 .  HYDROcodone-acetaminophen (NORCO) 7.5-325 MG tablet, Take 1 tablet by mouth 2 (two) times daily., Disp: 60 tablet, Rfl: 0 .  HYDROcodone-acetaminophen (NORCO) 7.5-325 MG tablet, Take 1 tablet by mouth every 6 (six) hours as needed for moderate pain., Disp: 60 tablet, Rfl: 0 .  metoprolol (LOPRESSOR) 50 MG tablet, Take 1 tablet (50 mg total) by mouth 2 (two) times daily., Disp: 180 tablet, Rfl: 1 .  omeprazole (PRILOSEC) 40 MG capsule, TAKE 1 CAPSULE EVERY DAY, Disp: 30 capsule, Rfl: 5 .  tiZANidine (ZANAFLEX) 4 MG tablet, TAKE 1 TABLET (4 MG TOTAL) BY MOUTH 3 (THREE) TIMES DAILY., Disp: 90 tablet, Rfl: 2 .  venlafaxine XR (EFFEXOR XR) 37.5 MG 24 hr capsule, Take 1-2 capsules (37.5-75 mg total) by mouth daily with breakfast. First week take one daily after that 2 daily in place of Cymbalta, Disp: 60 capsule, Rfl: 0 .  vitamin E 400 UNIT  capsule, Take 1 capsule by mouth 2 (two) times daily., Disp: , Rfl:   Allergies  Allergen Reactions  . Sulfa Antibiotics      ROS  Constitutional: Negative for fever or weight change.  Respiratory: Negative for cough and shortness of breath.   Cardiovascular: Negative for chest pain , positive for palpitations.  Gastrointestinal: Negative for abdominal pain, no bowel changes - she is constipated.  Musculoskeletal: Negative for gait problem or joint swelling.  Skin: Negative for rash.  Neurological: Negative for dizziness or headache.  No other specific complaints in a complete review of systems (except as listed in HPI above).  Objective  Filed Vitals:   10/16/15 1338  BP: 126/60  Pulse: 90  Temp: 97.7 F (36.5 C)  TempSrc: Oral  Resp: 16  Height: '5\' 4"'  (1.626 m)  Weight: 178 lb (80.74 kg)  SpO2: 92%    Body mass index is 30.54 kg/(m^2).  Physical Exam  Constitutional: Patient appears well-developed  Obese No distress.  HEENT: head atraumatic, normocephalic, pupils equal and reactive to light, neck decrease rom , throat within normal limits. Boggy turbinates, TM on the left partially seen and normal, some wax on ear canal, normal on right side.  Cardiovascular: Normal rate, regular rhythm and normal heart sounds. No murmur heard. No BLE edema. Pulmonary/Chest: Effort normal and breath sounds normal. No respiratory distress. Abdominal: Soft. There is no tenderness. Psychiatric: Patient has a depressed mood and flat affect. behavior is normal. Judgment and thought content normal. Muscular Skeletal: pain during palpation of lumbar spine left paraspinal muscle but very mild, negative straight leg raise, crepitus with extension of both knees, worse on left side. Neck has pain during palpation of spinal processes, no pain  during rotation , but has pain with lateral bending of neck to the left, normal grip both hands.   Recent Results (from the past 2160 hour(s))  Basic  metabolic panel     Status: None   Collection Time: 08/05/15  4:50 PM  Result Value Ref Range   Sodium 139 135 - 145 mmol/L   Potassium 3.5 3.5 - 5.1 mmol/L   Chloride 101 101 - 111 mmol/L   CO2 30 22 - 32 mmol/L   Glucose, Bld 95 65 - 99 mg/dL   BUN 8 6 - 20 mg/dL   Creatinine, Ser 0.77 0.44 - 1.00 mg/dL   Calcium 9.7 8.9 - 10.3 mg/dL   GFR calc non Af Amer >60 >60 mL/min   GFR calc Af Amer >60 >60 mL/min    Comment: (NOTE) The eGFR has been calculated using the CKD EPI equation. This calculation has not been validated in all clinical situations. eGFR's persistently <60 mL/min signify possible Chronic Kidney Disease.    Anion gap 8 5 - 15  CBC     Status: Abnormal   Collection Time: 08/05/15  4:50 PM  Result Value Ref Range   WBC 10.2 3.6 - 11.0 K/uL   RBC 4.58 3.80 - 5.20 MIL/uL   Hemoglobin 13.9 12.0 - 16.0 g/dL   HCT 40.5 35.0 - 47.0 %   MCV 88.3 80.0 - 100.0 fL   MCH 30.3 26.0 - 34.0 pg   MCHC 34.4 32.0 - 36.0 g/dL   RDW 15.0 (H) 11.5 - 14.5 %   Platelets 275 150 - 440 K/uL  Urinalysis complete, with microscopic ([facility] only)     Status: Abnormal   Collection Time: 08/05/15  4:51 PM  Result Value Ref Range   Color, Urine YELLOW (A) YELLOW   APPearance HAZY (A) CLEAR   Glucose, UA NEGATIVE NEGATIVE mg/dL   Bilirubin Urine NEGATIVE NEGATIVE   Ketones, ur NEGATIVE NEGATIVE mg/dL   Specific Gravity, Urine 1.023 1.005 - 1.030   Hgb urine dipstick NEGATIVE NEGATIVE   pH 5.0 5.0 - 8.0   Protein, ur NEGATIVE NEGATIVE mg/dL   Nitrite NEGATIVE NEGATIVE   Leukocytes, UA 3+ (A) NEGATIVE   RBC / HPF 6-30 0 - 5 RBC/hpf   WBC, UA TOO NUMEROUS TO COUNT 0 - 5 WBC/hpf   Bacteria, UA NONE SEEN NONE SEEN   Squamous Epithelial / LPF 0-5 (A) NONE SEEN   Mucous PRESENT   Urine  culture     Status: None   Collection Time: 08/05/15  4:51 PM  Result Value Ref Range   Specimen Description URINE, RANDOM    Special Requests NONE    Culture MULTIPLE SPECIES PRESENT, SUGGEST RECOLLECTION     Report Status 08/08/2015 FINAL   Lipid panel     Status: Abnormal   Collection Time: 09/30/15  9:07 AM  Result Value Ref Range   Cholesterol, Total 194 100 - 199 mg/dL   Triglycerides 343 (H) 0 - 149 mg/dL   HDL 45 >39 mg/dL   VLDL Cholesterol Cal 69 (H) 5 - 40 mg/dL   LDL Calculated 80 0 - 99 mg/dL   Chol/HDL Ratio 4.3 0.0 - 4.4 ratio units    Comment:                                   T. Chol/HDL Ratio                                             Men  Women                               1/2 Avg.Risk  3.4    3.3                                   Avg.Risk  5.0    4.4                                2X Avg.Risk  9.6    7.1                                3X Avg.Risk 23.4   11.0   Comprehensive metabolic panel     Status: None   Collection Time: 09/30/15  9:07 AM  Result Value Ref Range   Glucose 92 65 - 99 mg/dL   BUN 13 8 - 27 mg/dL   Creatinine, Ser 0.93 0.57 - 1.00 mg/dL   GFR calc non Af Amer 67 >59 mL/min/1.73   GFR calc Af Amer 77 >59 mL/min/1.73   BUN/Creatinine Ratio 14 11 - 26   Sodium 141 134 - 144 mmol/L   Potassium 4.2 3.5 - 5.2 mmol/L   Chloride 97 96 - 106 mmol/L   CO2 26 18 - 29 mmol/L   Calcium 10.0 8.7 - 10.3 mg/dL   Total Protein 7.0 6.0 - 8.5 g/dL   Albumin 4.5 3.6 - 4.8 g/dL   Globulin, Total 2.5 1.5 - 4.5 g/dL   Albumin/Globulin Ratio 1.8 1.1 - 2.5    Comment: **Effective October 20, 2015 the reference interval**   for A/G Ratio will be changing to:              Age                Female          Female           0 -  7 days  1.1 - 2.3       1.1 - 2.3           8 - 30 days       1.2 - 2.8       1.2 - 2.8           1 -  6 months     1.3 - 3.6       1.3 - 3.6    7 months -  5 years      1.5 - 2.6       1.5 - 2.6              > 5 years      1.2 - 2.2       1.2 - 2.2    Bilirubin Total 0.3 0.0 - 1.2 mg/dL   Alkaline Phosphatase 70 39 - 117 IU/L   AST 26 0 - 40 IU/L   ALT 32 0 - 32 IU/L      PHQ2/9: Depression screen [facility] 2/9 10/16/2015 07/17/2015  04/02/2015 01/16/2015  Decreased Interest 3 0 3 1  Down, Depressed, Hopeless 3 0 3 1  PHQ - 2 Score 6 0 6 2  Altered sleeping 3 - 2 -  Tired, decreased energy 3 - 3 -  Change in appetite 0 - 1 -  Feeling bad or failure about yourself  0 - 2 -  Trouble concentrating 0 - 0 -  Moving slowly or fidgety/restless 0 - 1 -  Suicidal thoughts 0 - 0 -  PHQ-9 Score 12 - 15 -  Difficult doing work/chores Not difficult at all - Not difficult at all -     Fall Risk: Fall Risk  10/16/2015 07/17/2015 04/02/2015 01/16/2015  Falls in the past year? No No No No     Functional Status Survey: Is the patient deaf or have difficulty hearing?: No Does the patient have difficulty seeing, even when wearing glasses/contacts?: No Does the patient have difficulty concentrating, remembering, or making decisions?: No Does the patient have difficulty walking or climbing stairs?: No Does the patient have difficulty dressing or bathing?: No Does the patient have difficulty doing errands alone such as visiting a doctor's office or shopping?: No   Assessment & Plan  1. GAD (generalized anxiety disorder)  - ALPRAZolam (XANAX XR) 1 MG 24 hr tablet; Take 1 tablet (1 mg total) by mouth daily.  Dispense: 30 tablet; Refill: 2 - venlafaxine XR (EFFEXOR XR) 37.5 MG 24 hr capsule; Take 1-2 capsules (37.5-75 mg total) by mouth daily with breakfast. First week take one daily after that 2 daily in place of Cymbalta  Dispense: 60 capsule; Refill: 0  2. Benign hypertension  - hydrochlorothiazide (MICROZIDE) 12.5 MG capsule; Take 1 capsule (12.5 mg total) by mouth daily.  Dispense: 90 capsule; Refill: 1  3. Chronic cervical pain  MRI will be done next week  4. Chronic recurrent major depressive disorder ([place])  Explained off label indication for depakote, we will try Effexor to see if it is cheaper, otherwise switch to Citalopram, gave her suicide hotline - venlafaxine XR (EFFEXOR XR) 37.5 MG 24 hr capsule; Take 1-2 capsules  (37.5-75 mg total) by mouth daily with breakfast. First week take one daily after that 2 daily in place of Cymbalta  Dispense: 60 capsule; Refill: 0 - divalproex (DEPAKOTE) 250 MG DR tablet; Take 1 tablet (250 mg total) by mouth at bedtime.  Dispense: 30 tablet; Refill: 0  5. Left lumbar radiculitis  -  HYDROcodone-acetaminophen (NORCO) 7.5-325 MG tablet; Take 1 tablet by mouth every 6 (six) hours as needed for moderate pain.  Dispense: 60 tablet; Refill: 0 - HYDROcodone-acetaminophen (NORCO) 7.5-325 MG tablet; Take 1 tablet by mouth 2 (two) times daily.  Dispense: 60 tablet; Refill: 0 - HYDROcodone-acetaminophen (NORCO) 7.5-325 MG tablet; Take 1 tablet by mouth every 6 (six) hours as needed for moderate pain.  Dispense: 60 tablet; Refill: 0  6. Obstructive apnea  She never used a CPAP machine, explained the increase risk of strokes and MI's   7. History of fusion of cervical spine  Needs neurosurgeon evaluation depending on MRI

## 2015-10-20 ENCOUNTER — Ambulatory Visit
Admission: RE | Admit: 2015-10-20 | Discharge: 2015-10-20 | Disposition: A | Discharge status: Home / Self Care | Payer: Commercial Managed Care - HMO | Source: Ambulatory Visit | Attending: Family Medicine | Admitting: Family Medicine

## 2015-10-20 DIAGNOSIS — Z981 Arthrodesis status: Secondary | ICD-10-CM | POA: Diagnosis not present

## 2015-10-20 DIAGNOSIS — M47813 Spondylosis without myelopathy or radiculopathy, cervicothoracic region: Secondary | ICD-10-CM | POA: Insufficient documentation

## 2015-10-20 DIAGNOSIS — M542 Cervicalgia: Secondary | ICD-10-CM | POA: Diagnosis present

## 2015-10-20 DIAGNOSIS — G8929 Other chronic pain: Secondary | ICD-10-CM

## 2015-10-20 DIAGNOSIS — M4690 Unspecified inflammatory spondylopathy, site unspecified: Secondary | ICD-10-CM | POA: Diagnosis not present

## 2015-10-21 ENCOUNTER — Other Ambulatory Visit: Payer: Self-pay | Admitting: Family Medicine

## 2015-10-21 DIAGNOSIS — M5412 Radiculopathy, cervical region: Secondary | ICD-10-CM

## 2015-10-31 ENCOUNTER — Other Ambulatory Visit: Payer: Self-pay | Admitting: Family Medicine

## 2015-10-31 DIAGNOSIS — F411 Generalized anxiety disorder: Secondary | ICD-10-CM

## 2015-10-31 DIAGNOSIS — F339 Major depressive disorder, recurrent, unspecified: Secondary | ICD-10-CM

## 2015-10-31 NOTE — Telephone Encounter (Signed)
Was placed on 2 new prescriptions (Venlafaxine HCl ER 37.56m capsules twice daily and Divalproex Sod Dr 2553mtab once at bed time) and they are working. Now [facility] is asking that we please send it over that way pharmacy will have it on hold.

## 2015-11-02 ENCOUNTER — Encounter: Payer: Self-pay | Admitting: Medical Oncology

## 2015-11-02 ENCOUNTER — Emergency Department
Admission: EM | Admit: 2015-11-02 | Discharge: 2015-11-02 | Disposition: A | Discharge status: Home / Self Care | Payer: 59 | Attending: Emergency Medicine | Admitting: Emergency Medicine

## 2015-11-02 DIAGNOSIS — Z87891 Personal history of nicotine dependence: Secondary | ICD-10-CM | POA: Insufficient documentation

## 2015-11-02 DIAGNOSIS — R944 Abnormal results of kidney function studies: Secondary | ICD-10-CM | POA: Insufficient documentation

## 2015-11-02 DIAGNOSIS — N289 Disorder of kidney and ureter, unspecified: Secondary | ICD-10-CM | POA: Insufficient documentation

## 2015-11-02 DIAGNOSIS — Z7952 Long term (current) use of systemic steroids: Secondary | ICD-10-CM | POA: Insufficient documentation

## 2015-11-02 DIAGNOSIS — Z79899 Other long term (current) drug therapy: Secondary | ICD-10-CM | POA: Insufficient documentation

## 2015-11-02 DIAGNOSIS — Z7951 Long term (current) use of inhaled steroids: Secondary | ICD-10-CM | POA: Insufficient documentation

## 2015-11-02 DIAGNOSIS — M542 Cervicalgia: Secondary | ICD-10-CM

## 2015-11-02 DIAGNOSIS — I10 Essential (primary) hypertension: Secondary | ICD-10-CM | POA: Insufficient documentation

## 2015-11-02 DIAGNOSIS — R899 Unspecified abnormal finding in specimens from other organs, systems and tissues: Secondary | ICD-10-CM

## 2015-11-02 LAB — CBC WITH DIFFERENTIAL/PLATELET
BASOS PCT: 1 %
Basophils Absolute: 0.1 10*3/uL (ref 0–0.1)
EOS ABS: 0.2 10*3/uL (ref 0–0.7)
Eosinophils Relative: 2 %
HEMATOCRIT: 38 % (ref 35.0–47.0)
Hemoglobin: 13 g/dL (ref 12.0–16.0)
LYMPHS ABS: 1.8 10*3/uL (ref 1.0–3.6)
Lymphocytes Relative: 24 %
MCH: 29.9 pg (ref 26.0–34.0)
MCHC: 34.1 g/dL (ref 32.0–36.0)
MCV: 87.6 fL (ref 80.0–100.0)
MONO ABS: 0.6 10*3/uL (ref 0.2–0.9)
MONOS PCT: 8 %
Neutro Abs: 4.9 10*3/uL (ref 1.4–6.5)
Neutrophils Relative %: 65 %
Platelets: 238 10*3/uL (ref 150–440)
RBC: 4.34 MIL/uL (ref 3.80–5.20)
RDW: 14.3 % (ref 11.5–14.5)
WBC: 7.5 10*3/uL (ref 3.6–11.0)

## 2015-11-02 LAB — BASIC METABOLIC PANEL
Anion gap: 9 (ref 5–15)
BUN: 11 mg/dL (ref 6–20)
CALCIUM: 9.2 mg/dL (ref 8.9–10.3)
CHLORIDE: 98 mmol/L — AB (ref 101–111)
CO2: 29 mmol/L (ref 22–32)
CREATININE: 1.19 mg/dL — AB (ref 0.44–1.00)
GFR calc Af Amer: 56 mL/min — ABNORMAL LOW (ref >60)
GFR calc non Af Amer: 49 mL/min — ABNORMAL LOW (ref >60)
GLUCOSE: 105 mg/dL — AB (ref 65–99)
Potassium: 3.6 mmol/L (ref 3.5–5.1)
Sodium: 136 mmol/L (ref 135–145)

## 2015-11-02 LAB — URINALYSIS COMPLETE WITH MICROSCOPIC (ARMC ONLY)
BACTERIA UA: NONE SEEN
Bilirubin Urine: NEGATIVE
Glucose, UA: NEGATIVE mg/dL
Hgb urine dipstick: NEGATIVE
KETONES UR: NEGATIVE mg/dL
Nitrite: NEGATIVE
PH: 7 (ref 5.0–8.0)
PROTEIN: NEGATIVE mg/dL
RBC / HPF: NONE SEEN RBC/hpf (ref 0–5)
Specific Gravity, Urine: 1.008 (ref 1.005–1.030)

## 2015-11-02 LAB — MONONUCLEOSIS SCREEN: MONO SCREEN: NEGATIVE

## 2015-11-02 LAB — POCT RAPID STREP A: STREPTOCOCCUS, GROUP A SCREEN (DIRECT): NEGATIVE

## 2015-11-02 NOTE — ED Notes (Signed)
Pt c/o neck pain on the right side which she states she has swollen lymph nodes.  Pt aware strep test was negative.  Pt denies any cough or drainage at this time.

## 2015-11-02 NOTE — ED Notes (Signed)
Pt reports the glands in her neck have been enlarged since Thursday night. Pt denies fever, denies sore throat, denies any other sx's. NAD noted.

## 2015-11-02 NOTE — ED Notes (Signed)
Pt verbalized understanding of discharge instructions. NAD at this time. 

## 2015-11-02 NOTE — ED Provider Notes (Signed)
[facility] Emergency Department Provider Note  ____________________________________________  Time seen: Approximately 10:30 AM  I have reviewed the triage vital signs and the nursing notes.   HISTORY  Chief Complaint Lymphadenopathy    HPI Judy Allen is a 60 y.o. female , NAD, presents to the emergency department with right-sided cervical lymphadenopathy enlargement since Thursday evening. The area is tender to palpation. Has not had difficulty moving her neck or head and denies any headaches. Has not had any injuries, traumas, falls. Has not had any fever, chills, night sweats, body aches. Has not had any upper respiratory type symptoms such as sore throat, nasal congestion, runny nose, sneezing, sinus pressure, ear pain. Denies sick contacts. Is taking chronic Norco 7.5 mg tablets which is prescribed by the patient's primary care provider but it has not assisted with decreasing her right-sided neck pain.   Past Medical History  Diagnosis Date  . H/O allergic rhinitis   . Neuritis   . Urinary incontinence   . Proteinuria   . GERD (gastroesophageal reflux disease)   . Hyperlipidemia   . Ovarian failure   . Constipation   . Psoriasis   . Heart murmur   . Tachycardia, paroxysmal ([place])   . Hypertension     Patient Active Problem List   Diagnosis Date Noted  . Chronic constipation 10/16/2015  . History of fusion of cervical spine 04/02/2015  . Benign hypertension 01/16/2015  . Chronic cervical pain 01/16/2015  . CN (constipation) 01/16/2015  . Gastric reflux 01/16/2015  . Hypertriglyceridemia 01/16/2015  . Edema leg 01/16/2015  . Chronic recurrent major depressive disorder ([place]) 01/16/2015  . Dysmetabolic syndrome [id number]  . Obstructive apnea 01/16/2015  . Psoriasis 01/16/2015  . Allergic rhinitis 01/16/2015  . Bursitis, trochanteric 01/16/2015  . GAD (generalized anxiety disorder) 01/16/2015  . Tachycardia, paroxysmal [facility])      Past Surgical History  Procedure Laterality Date  . Tubal ligation      s/p  . Tonsillectomy      history  . Herniated disc repair    . Anterior fusion lumbar spine    . Cardiac catheterization  2009?     [facility];[place]  . Cardiac catheterization  05/2012    [facility]: Mild hypertension 37/12 with a mean pressure of 21 mm mercury    Current Outpatient Rx  Name  Route  Sig  Dispense  Refill  . ALPRAZolam (XANAX XR) 1 MG 24 hr tablet   Oral   Take 1 tablet (1 mg total) by mouth daily.   30 tablet   2   . clobetasol cream (TEMOVATE) 0.05 %   Topical   Apply topically 2 (two) times daily.   60 g   2   . desonide (DESOWEN) 0.05 % cream   Topical   Apply topically 2 (two) times daily.   60 g   2   . divalproex (DEPAKOTE) 250 MG DR tablet   Oral   Take 1 tablet (250 mg total) by mouth at bedtime.   30 tablet   0   . fluticasone (FLONASE) 50 MCG/ACT nasal spray   Each Nare   Place 2 sprays into both nostrils daily.   16 g   6   . gabapentin (NEURONTIN) 300 MG capsule      TAKE 1 CAPSULE TWICE A DAY   180 capsule   1   . gemfibrozil (LOPID) 600 MG tablet   Oral   Take 1 tablet (600 mg total) by mouth  daily.   30 tablet   5   . hydrochlorothiazide (MICROZIDE) 12.5 MG capsule   Oral   Take 1 capsule (12.5 mg total) by mouth daily.   90 capsule   1   . HYDROcodone-acetaminophen (NORCO) 7.5-325 MG tablet   Oral   Take 1 tablet by mouth every 6 (six) hours as needed for moderate pain.   60 tablet   0     Fill May 6th, 2017   . HYDROcodone-acetaminophen (NORCO) 7.5-325 MG tablet   Oral   Take 1 tablet by mouth 2 (two) times daily.   60 tablet   0   . HYDROcodone-acetaminophen (NORCO) 7.5-325 MG tablet   Oral   Take 1 tablet by mouth every 6 (six) hours as needed for moderate pain.   60 tablet   0     Fill April 8th, 2017   . metoprolol (LOPRESSOR) 50 MG tablet   Oral   Take 1 tablet (50 mg total) by mouth 2 (two) times daily.   180 tablet   1    . omeprazole (PRILOSEC) 40 MG capsule      TAKE 1 CAPSULE EVERY DAY   30 capsule   5   . polyethylene glycol powder (GLYCOLAX/MIRALAX) powder   Oral   Take 17 g by mouth daily.   3350 g   2   . tiZANidine (ZANAFLEX) 4 MG tablet      TAKE 1 TABLET (4 MG TOTAL) BY MOUTH 3 (THREE) TIMES DAILY.   90 tablet   2   . venlafaxine XR (EFFEXOR XR) 37.5 MG 24 hr capsule   Oral   Take 1-2 capsules (37.5-75 mg total) by mouth daily with breakfast. First week take one daily after that 2 daily in place of Cymbalta   60 capsule   0   . vitamin E 400 UNIT capsule   Oral   Take 1 capsule by mouth 2 (two) times daily.           Allergies Sulfa antibiotics  Family History  Problem Relation Age of Onset  . Heart attack Mother     Social History Social History  Substance Use Topics  . Smoking status: Former Smoker -- 1.00 packs/day for 32 years    Types: Cigarettes    Start date: 08/10/1963    Quit date: 01/15/2006  . Smokeless tobacco: Never Used  . Alcohol Use: No     Review of Systems  Constitutional: No fever/chills, fatigue, night sweats Eyes: No visual changes. No discharge, redness, swelling ENT: No sore throat, nasal congestion, sneezing, runny nose, ear pain. Cardiovascular: No chest pain. Respiratory: No cough. No shortness of breath. No wheezing.  Gastrointestinal: No abdominal pain.  No nausea, vomiting.  Musculoskeletal: Negative for back, neck pain.  Skin: Negative for rash. Neurological: Negative for headaches, focal weakness or numbness. 10-point ROS otherwise negative.  ____________________________________________   PHYSICAL EXAM:  VITAL SIGNS: ED Triage Vitals  Enc Vitals Group     BP 11/02/15 1013 129/71 mmHg     Pulse Rate 11/02/15 1013 63     Resp 11/02/15 1013 17     Temp 11/02/15 1013 97.5 F (36.4 C)     Temp Source 11/02/15 1013 Oral     SpO2 11/02/15 1013 96 %     Weight 11/02/15 1013 178 lb (80.74 kg)     Height 11/02/15 1013 5'  4" (1.626 m)     Head Cir --  Peak Flow --      Pain Score 11/02/15 1014 6     Pain Loc --      Pain Edu? --      Excl. in [place]? --     Constitutional: Alert and oriented. Well appearing and in no acute distress. Eyes: Conjunctivae are normal. PERRL. EOMI without pain.  Head: Atraumatic. ENT:      Ears: TMs visualized bilaterally without effusion, bulging, perforation.      Nose: No congestion/rhinnorhea.      Mouth/Throat: Mucous membranes are moist. Pharynx without erythema, swelling, exudate. Neck: No stridor. No cervical spine tenderness to palpation. Supple with full range of motion. Hematological/Lymphatic/Immunilogical: No cervical lymphadenopathy. Cardiovascular: Normal rate, regular rhythm. Normal S1 and S2.  Good peripheral circulation. Respiratory: Normal respiratory effort without tachypnea or retractions. Lungs CTAB with breath sounds noted throughout all lung fields. Gastrointestinal: Soft and nontender in all quadrants. No distention or guarding. Bowel sounds grossly normal in all quadrants. No CVA tenderness. Musculoskeletal: No lower extremity tenderness nor edema.  No joint effusions. Neurologic:  Normal speech and language. No gross focal neurologic deficits are appreciated.  Skin:  Skin is warm, dry and intact. No rash noted. Psychiatric: Mood and affect are normal. Speech and behavior are normal. Patient exhibits appropriate insight and judgement.   ____________________________________________   LABS (all labs ordered are listed, but only abnormal results are displayed)  Labs Reviewed  BASIC METABOLIC PANEL - Abnormal; Notable for the following:    Chloride 98 (*)    Glucose, Bld 105 (*)    Creatinine, Ser 1.19 (*)    GFR calc non Af Amer 49 (*)    GFR calc Af Amer 56 (*)    All other components within normal limits  URINALYSIS COMPLETEWITH MICROSCOPIC ([facility] ONLY) - Abnormal; Notable for the following:    Color, Urine STRAW (*)    APPearance CLEAR (*)     Leukocytes, UA TRACE (*)    Squamous Epithelial / LPF 0-5 (*)    All other components within normal limits  URINE CULTURE  CBC WITH DIFFERENTIAL/PLATELET  MONONUCLEOSIS SCREEN  POCT RAPID STREP A   ____________________________________________  EKG  None ____________________________________________  RADIOLOGY  None ____________________________________________    PROCEDURES  Procedure(s) performed: None    Medications - No data to display   ____________________________________________   INITIAL IMPRESSION / ASSESSMENT AND PLAN / ED COURSE  Increase in creatinine and decrease in GFR was noted incidentally on lab work to evaluate patient's reported cervical lymph pain. Further physical exam was completed without any abnormal findings. Patient does note she was placed on 2 new medications for anxiety within the last 3 weeks. She cannot remember the names of the medications but as I reviewed her medications and Epic, Depakote and Effexor were added to the patient's medication list as of 10/16/2015. I discussed these medications with the patient and she states the names sound familiar.  There is a possibility that the patient's new medications could be causing these acute changes in her lab work. At this time she denies any symptoms that would suggest acute renal failure and her vital signs have been stable and normal throughout her emergency department course. I discussed the patient's care and reviewed the patient's lab work with Dr. Eula Listen. She agrees that current changes in the patient's lab work could be related to dehydration or potentially the change in medications recently. He was advised that the patient aggressively hydrate over the course of the day  today. The patient should see her primary care provider in follow-up tomorrow to have repeat kidney function panel completed to assess for any changes. At this time, patient should continue medications as  prescribed as sudden discontinuation of Depakote as well as Effexor could cause significant adverse effects.  Pertinent labs & imaging results that were available during my care of the patient were reviewed by me and considered in my medical decision making (see chart for details). Urine culture results were unavailable at discharge.  Patient's diagnosis is consistent with right-sided neck pain that I anticipate is viral in nature. Abnormal creatinine and GFR were noted incidentally on laboratory findings today. Please see note above. Patient will be discharged home with instructions to follow up with her primary care provider tomorrow. Patient was advised to aggressively hydrate today in anticipate for repeat blood work tomorrow with her primary care provider. Patient was given a work note to excuse from work today and she has today off tomorrow to be able to follow with primary. Patient is given ED precautions to return to the ED for any worsening or new symptoms.    ____________________________________________  FINAL CLINICAL IMPRESSION(S) / ED DIAGNOSES  Final diagnoses:  Neck pain on right side  Abnormal laboratory test result  Acute kidney insufficiency      NEW MEDICATIONS STARTED DURING THIS VISIT:  New Prescriptions   No medications on file         Braxton Feathers, PA-C 11/02/15 1317  Eula Listen, MD 11/02/15 1450

## 2015-11-02 NOTE — Discharge Instructions (Signed)
Lymphadenopathy °Lymphadenopathy refers to swollen or enlarged lymph glands, also called lymph nodes. Lymph glands are part of your body's defense (immune) system, which protects the body from infections, germs, and diseases. Lymph glands are found in many locations in your body, including the neck, underarm, and groin.  °Many things can cause lymph glands to become enlarged. When your immune system responds to germs, such as viruses or bacteria, infection-fighting cells and fluid build up. This causes the glands to grow in size. Usually, this is not something to worry about. The swelling and any soreness often go away without treatment. However, swollen lymph glands can also be caused by a number of diseases. Your health care provider may do various tests to help determine the cause. If the cause of your swollen lymph glands cannot be found, it is important to monitor your condition to make sure the swelling goes away. °HOME CARE INSTRUCTIONS °Watch your condition for any changes. The following actions may help to lessen any discomfort you are feeling: °· Get plenty of rest. °· Take medicines only as directed by your health care provider. Your health care provider may recommend over-the-counter medicines for pain. °· Apply moist heat compresses to the site of swollen lymph nodes as directed by your health care provider. This can help reduce any pain. °· Check your lymph nodes daily for any changes. °· Keep all follow-up visits as directed by your health care provider. This is important. °SEEK MEDICAL CARE IF: °· Your lymph nodes are still swollen after 2 weeks. °· Your swelling increases or spreads to other areas. °· Your lymph nodes are hard, seem fixed to the skin, or are growing rapidly. °· Your skin over the lymph nodes is red and inflamed. °· You have a fever. °· You have chills. °· You have fatigue. °· You develop a sore throat. °· You have abdominal pain. °· You have weight loss. °· You have night  sweats. °SEEK IMMEDIATE MEDICAL CARE IF: °· You notice fluid leaking from the area of the enlarged lymph node. °· You have severe pain in any area of your body. °· You have chest pain. °· You have shortness of breath. °  °This information is not intended to replace advice given to you by your health care provider. Make sure you discuss any questions you have with your health care provider. °  °Document Released: 05/04/2008 Document Revised: 08/16/2014 Document Reviewed: 02/28/2014 °[employer] Interactive Patient Education ©2016 [employer] ° °

## 2015-11-03 ENCOUNTER — Encounter: Payer: Self-pay | Admitting: Family Medicine

## 2015-11-03 ENCOUNTER — Ambulatory Visit (INDEPENDENT_AMBULATORY_CARE_PROVIDER_SITE_OTHER): Payer: 59 | Admitting: Family Medicine

## 2015-11-03 VITALS — BP 122/64 | HR 74 | Temp 97.9°F | Resp 16 | Wt 176.9 lb

## 2015-11-03 DIAGNOSIS — M7912 Myalgia of auxiliary muscles, head and neck: Secondary | ICD-10-CM

## 2015-11-03 DIAGNOSIS — R944 Abnormal results of kidney function studies: Secondary | ICD-10-CM | POA: Diagnosis not present

## 2015-11-03 DIAGNOSIS — F411 Generalized anxiety disorder: Secondary | ICD-10-CM | POA: Diagnosis not present

## 2015-11-03 DIAGNOSIS — F339 Major depressive disorder, recurrent, unspecified: Secondary | ICD-10-CM

## 2015-11-03 DIAGNOSIS — M791 Myalgia: Secondary | ICD-10-CM

## 2015-11-03 LAB — URINE CULTURE

## 2015-11-03 MED ORDER — DIVALPROEX SODIUM 250 MG PO DR TAB: 250.0000 mg | DELAYED_RELEASE_TABLET | Freq: Every day | ORAL | Status: DC

## 2015-11-03 MED ORDER — VENLAFAXINE HCL ER 75 MG PO CP24: 75.0000 mg | ORAL_CAPSULE | Freq: Every day | ORAL | Status: DC

## 2015-11-03 NOTE — Progress Notes (Signed)
Name: Judy Allen   MRN: [id number]    DOB: 1955/10/22   Date:11/03/2015       Progress Note  Subjective  Chief Complaint  Chief Complaint  Patient presents with  . Neck Pain    patient presents wih right sided neck pain since Thursday. patient went to the er and was told it was viral.  . Abnormal Lab    patient was also told her kidney function was not good and that she should f/u with her pcp.    HPI  Neck pain: she was watching TV Thursday evening and developed right side neck pain. Pain is described as sharp and throbbing pain, sometimes burning. Pain is triggered by touch or when she rotates her head to the left side. No trauma. No sore throat, no cold symptoms or fever.   GFR decrease: seen at [facility] yesterday for neck pain and GFR was lower than normal, she denies taking NSAID's. We will monitor. Normal urine volume.   GAD/Depression Major: currently on Effexor and Depakote for the past month, because Abilify and Cymbalta because of cost - no longer could afford. Since she resume taking antidepressants she is feeling much better. No longer has crying spells, anhedonia is under control, no longer sleeping all the time, no longer has fatigue. Medication is working    Patient Active Problem List   Diagnosis Date Noted  . Chronic constipation 10/16/2015  . History of fusion of cervical spine 04/02/2015  . Benign hypertension 01/16/2015  . Chronic cervical pain 01/16/2015  . CN (constipation) 01/16/2015  . Gastric reflux 01/16/2015  . Hypertriglyceridemia 01/16/2015  . Edema leg 01/16/2015  . Chronic recurrent major depressive disorder ([place]) 01/16/2015  . Dysmetabolic syndrome 63/14/9702  . Obstructive apnea 01/16/2015  . Psoriasis 01/16/2015  . Allergic rhinitis 01/16/2015  . Bursitis, trochanteric 01/16/2015  . GAD (generalized anxiety disorder) 01/16/2015  . Tachycardia, paroxysmal [facility])     Past Surgical History  Procedure Laterality Date  . Tubal ligation       s/p  . Tonsillectomy      history  . Herniated disc repair    . Anterior fusion lumbar spine    . Cardiac catheterization  2009?     [facility];[place]  . Cardiac catheterization  05/2012    RHC: Mild hypertension 37/12 with a mean pressure of 21 mm mercury    Family History  Problem Relation Age of Onset  . Heart attack Mother     Social History   Social History  . Marital Status: Married    Spouse Name: N/A  . Number of Children: N/A  . Years of Education: N/A   Occupational History  . Not on file.   Social History Main Topics  . Smoking status: Former Smoker -- 1.00 packs/day for 32 years    Types: Cigarettes    Start date: 08/10/1963    Quit date: 01/15/2006  . Smokeless tobacco: Never Used  . Alcohol Use: No  . Drug Use: No  . Sexual Activity: No   Other Topics Concern  . Not on file   Social History Narrative     Current outpatient prescriptions:  .  ALPRAZolam (XANAX XR) 1 MG 24 hr tablet, Take 1 tablet (1 mg total) by mouth daily., Disp: 30 tablet, Rfl: 2 .  clobetasol cream (TEMOVATE) 0.05 %, Apply topically 2 (two) times daily., Disp: 60 g, Rfl: 2 .  desonide (DESOWEN) 0.05 % cream, Apply topically 2 (two) times daily., Disp: 60 g,  Rfl: 2 .  divalproex (DEPAKOTE) 250 MG DR tablet, Take 1 tablet (250 mg total) by mouth at bedtime., Disp: 30 tablet, Rfl: 5 .  fluticasone (FLONASE) 50 MCG/ACT nasal spray, Place 2 sprays into both nostrils daily., Disp: 16 g, Rfl: 6 .  gabapentin (NEURONTIN) 300 MG capsule, TAKE 1 CAPSULE TWICE A DAY, Disp: 180 capsule, Rfl: 1 .  gemfibrozil (LOPID) 600 MG tablet, Take 1 tablet (600 mg total) by mouth daily., Disp: 30 tablet, Rfl: 5 .  hydrochlorothiazide (MICROZIDE) 12.5 MG capsule, Take 1 capsule (12.5 mg total) by mouth daily., Disp: 90 capsule, Rfl: 1 .  HYDROcodone-acetaminophen (NORCO) 7.5-325 MG tablet, Take 1 tablet by mouth every 6 (six) hours as needed for moderate pain., Disp: 60 tablet, Rfl: 0 .   HYDROcodone-acetaminophen (NORCO) 7.5-325 MG tablet, Take 1 tablet by mouth 2 (two) times daily., Disp: 60 tablet, Rfl: 0 .  HYDROcodone-acetaminophen (NORCO) 7.5-325 MG tablet, Take 1 tablet by mouth every 6 (six) hours as needed for moderate pain., Disp: 60 tablet, Rfl: 0 .  metoprolol (LOPRESSOR) 50 MG tablet, Take 1 tablet (50 mg total) by mouth 2 (two) times daily., Disp: 180 tablet, Rfl: 1 .  omeprazole (PRILOSEC) 40 MG capsule, TAKE 1 CAPSULE EVERY DAY, Disp: 30 capsule, Rfl: 5 .  polyethylene glycol powder (GLYCOLAX/MIRALAX) powder, Take 17 g by mouth daily., Disp: 3350 g, Rfl: 2 .  tiZANidine (ZANAFLEX) 4 MG tablet, TAKE 1 TABLET (4 MG TOTAL) BY MOUTH 3 (THREE) TIMES DAILY., Disp: 90 tablet, Rfl: 2 .  venlafaxine XR (EFFEXOR-XR) 75 MG 24 hr capsule, Take 1 capsule (75 mg total) by mouth daily with breakfast. First week take one daily after that 2 daily in place of Cymbalta, Disp: 30 capsule, Rfl: 5 .  vitamin E 400 UNIT capsule, Take 1 capsule by mouth 2 (two) times daily., Disp: , Rfl:   Allergies  Allergen Reactions  . Sulfa Antibiotics      ROS  Ten systems reviewed and is negative except as mentioned in HPI   Objective  Filed Vitals:   11/03/15 1133  BP: 122/64  Pulse: 74  Temp: 97.9 F (36.6 C)  TempSrc: Oral  Resp: 16  Weight: 176 lb 14.4 oz (80.241 kg)  SpO2: 98%    Body mass index is 30.35 kg/(m^2).  Physical Exam  Constitutional: Patient appears well-developed and well-nourished. Obese  No distress.  HEENT: head atraumatic, normocephalic, pupils equal and reactive to light, ear - normal TM bilaterally, throat within normal limits Cardiovascular: Normal rate, regular rhythm and normal heart sounds.  No murmur heard. No BLE edema. Pulmonary/Chest: Effort normal and breath sounds normal. No respiratory distress. Abdominal: Soft.  There is no tenderness. Psychiatric: Patient has a normal mood and affect. behavior is normal. Judgment and thought content  normal. Neck: no lymphadenopathy, she has a tender right sternocleidomastoid during palpation of with neck rotation to the left side. No swelling or erythema  Recent Results (from the past 2160 hour(s))  Basic metabolic panel     Status: None   Collection Time: 08/05/15  4:50 PM  Result Value Ref Range   Sodium 139 135 - 145 mmol/L   Potassium 3.5 3.5 - 5.1 mmol/L   Chloride 101 101 - 111 mmol/L   CO2 30 22 - 32 mmol/L   Glucose, Bld 95 65 - 99 mg/dL   BUN 8 6 - 20 mg/dL   Creatinine, Ser 0.77 0.44 - 1.00 mg/dL   Calcium 9.7 8.9 - 10.3 mg/dL  GFR calc non Af Amer >60 >60 mL/min   GFR calc Af Amer >60 >60 mL/min    Comment: (NOTE) The eGFR has been calculated using the CKD EPI equation. This calculation has not been validated in all clinical situations. eGFR's persistently <60 mL/min signify possible Chronic Kidney Disease.    Anion gap 8 5 - 15  CBC     Status: Abnormal   Collection Time: 08/05/15  4:50 PM  Result Value Ref Range   WBC 10.2 3.6 - 11.0 K/uL   RBC 4.58 3.80 - 5.20 MIL/uL   Hemoglobin 13.9 12.0 - 16.0 g/dL   HCT 40.5 35.0 - 47.0 %   MCV 88.3 80.0 - 100.0 fL   MCH 30.3 26.0 - 34.0 pg   MCHC 34.4 32.0 - 36.0 g/dL   RDW 15.0 (H) 11.5 - 14.5 %   Platelets 275 150 - 440 K/uL  Urinalysis complete, with microscopic ([facility] only)     Status: Abnormal   Collection Time: 08/05/15  4:51 PM  Result Value Ref Range   Color, Urine YELLOW (A) YELLOW   APPearance HAZY (A) CLEAR   Glucose, UA NEGATIVE NEGATIVE mg/dL   Bilirubin Urine NEGATIVE NEGATIVE   Ketones, ur NEGATIVE NEGATIVE mg/dL   Specific Gravity, Urine 1.023 1.005 - 1.030   Hgb urine dipstick NEGATIVE NEGATIVE   pH 5.0 5.0 - 8.0   Protein, ur NEGATIVE NEGATIVE mg/dL   Nitrite NEGATIVE NEGATIVE   Leukocytes, UA 3+ (A) NEGATIVE   RBC / HPF 6-30 0 - 5 RBC/hpf   WBC, UA TOO NUMEROUS TO COUNT 0 - 5 WBC/hpf   Bacteria, UA NONE SEEN NONE SEEN   Squamous Epithelial / LPF 0-5 (A) NONE SEEN   Mucous PRESENT    Urine culture     Status: None   Collection Time: 08/05/15  4:51 PM  Result Value Ref Range   Specimen Description URINE, RANDOM    Special Requests NONE    Culture MULTIPLE SPECIES PRESENT, SUGGEST RECOLLECTION    Report Status 08/08/2015 FINAL   Lipid panel     Status: Abnormal   Collection Time: 09/30/15  9:07 AM  Result Value Ref Range   Cholesterol, Total 194 100 - 199 mg/dL   Triglycerides 343 (H) 0 - 149 mg/dL   HDL 45 >39 mg/dL   VLDL Cholesterol Cal 69 (H) 5 - 40 mg/dL   LDL Calculated 80 0 - 99 mg/dL   Chol/HDL Ratio 4.3 0.0 - 4.4 ratio units    Comment:                                   T. Chol/HDL Ratio                                             Men  Women                               1/2 Avg.Risk  3.4    3.3                                   Avg.Risk  5.0    4.4  2X Avg.Risk  9.6    7.1                                3X Avg.Risk 23.4   11.0   Comprehensive metabolic panel     Status: None   Collection Time: 09/30/15  9:07 AM  Result Value Ref Range   Glucose 92 65 - 99 mg/dL   BUN 13 8 - 27 mg/dL   Creatinine, Ser 0.93 0.57 - 1.00 mg/dL   GFR calc non Af Amer 67 >59 mL/min/1.73   GFR calc Af Amer 77 >59 mL/min/1.73   BUN/Creatinine Ratio 14 11 - 26   Sodium 141 134 - 144 mmol/L   Potassium 4.2 3.5 - 5.2 mmol/L   Chloride 97 96 - 106 mmol/L   CO2 26 18 - 29 mmol/L   Calcium 10.0 8.7 - 10.3 mg/dL   Total Protein 7.0 6.0 - 8.5 g/dL   Albumin 4.5 3.6 - 4.8 g/dL   Globulin, Total 2.5 1.5 - 4.5 g/dL   Albumin/Globulin Ratio 1.8 1.1 - 2.5    Comment: **Effective October 20, 2015 the reference interval**   for A/G Ratio will be changing to:              Age                Female          Female           0 -  7 days       1.1 - 2.3       1.1 - 2.3           8 - 30 days       1.2 - 2.8       1.2 - 2.8           1 -  6 months     1.3 - 3.6       1.3 - 3.6    7 months -  5 years      1.5 - 2.6       1.5 - 2.6              > 5 years       1.2 - 2.2       1.2 - 2.2    Bilirubin Total 0.3 0.0 - 1.2 mg/dL   Alkaline Phosphatase 70 39 - 117 IU/L   AST 26 0 - 40 IU/L   ALT 32 0 - 32 IU/L  Basic metabolic panel     Status: Abnormal   Collection Time: 11/02/15 10:43 AM  Result Value Ref Range   Sodium 136 135 - 145 mmol/L   Potassium 3.6 3.5 - 5.1 mmol/L   Chloride 98 (L) 101 - 111 mmol/L   CO2 29 22 - 32 mmol/L   Glucose, Bld 105 (H) 65 - 99 mg/dL   BUN 11 6 - 20 mg/dL   Creatinine, Ser 1.19 (H) 0.44 - 1.00 mg/dL   Calcium 9.2 8.9 - 10.3 mg/dL   GFR calc non Af Amer 49 (L) >60 mL/min   GFR calc Af Amer 56 (L) >60 mL/min    Comment: (NOTE) The eGFR has been calculated using the CKD EPI equation. This calculation has not been validated in all clinical situations. eGFR's persistently <60 mL/min signify possible Chronic Kidney Disease.    Anion gap 9 5 -   15  CBC with Differential     Status: None   Collection Time: 11/02/15 10:43 AM  Result Value Ref Range   WBC 7.5 3.6 - 11.0 K/uL   RBC 4.34 3.80 - 5.20 MIL/uL   Hemoglobin 13.0 12.0 - 16.0 g/dL   HCT 38.0 35.0 - 47.0 %   MCV 87.6 80.0 - 100.0 fL   MCH 29.9 26.0 - 34.0 pg   MCHC 34.1 32.0 - 36.0 g/dL   RDW 14.3 11.5 - 14.5 %   Platelets 238 150 - 440 K/uL   Neutrophils Relative % 65 %   Neutro Abs 4.9 1.4 - 6.5 K/uL   Lymphocytes Relative 24 %   Lymphs Abs 1.8 1.0 - 3.6 K/uL   Monocytes Relative 8 %   Monocytes Absolute 0.6 0.2 - 0.9 K/uL   Eosinophils Relative 2 %   Eosinophils Absolute 0.2 0 - 0.7 K/uL   Basophils Relative 1 %   Basophils Absolute 0.1 0 - 0.1 K/uL  Mononucleosis screen     Status: None   Collection Time: 11/02/15 10:43 AM  Result Value Ref Range   Mono Screen NEGATIVE NEGATIVE  POCT rapid strep A ([facility])     Status: None   Collection Time: 11/02/15 10:53 AM  Result Value Ref Range   Streptococcus, Group A Screen (Direct) NEGATIVE NEGATIVE  Urinalysis complete, with microscopic     Status: Abnormal   Collection Time: 11/02/15  12:27 PM  Result Value Ref Range   Color, Urine STRAW (A) YELLOW   APPearance CLEAR (A) CLEAR   Glucose, UA NEGATIVE NEGATIVE mg/dL   Bilirubin Urine NEGATIVE NEGATIVE   Ketones, ur NEGATIVE NEGATIVE mg/dL   Specific Gravity, Urine 1.008 1.005 - 1.030   Hgb urine dipstick NEGATIVE NEGATIVE   pH 7.0 5.0 - 8.0   Protein, ur NEGATIVE NEGATIVE mg/dL   Nitrite NEGATIVE NEGATIVE   Leukocytes, UA TRACE (A) NEGATIVE   RBC / HPF NONE SEEN 0 - 5 RBC/hpf   WBC, UA 0-5 0 - 5 WBC/hpf   Bacteria, UA NONE SEEN NONE SEEN   Squamous Epithelial / LPF 0-5 (A) NONE SEEN  Urine culture     Status: None   Collection Time: 11/02/15 12:27 PM  Result Value Ref Range   Specimen Description URINE, RANDOM    Special Requests none    Culture MULTIPLE SPECIES PRESENT, SUGGEST RECOLLECTION    Report Status 11/03/2015 FINAL      PHQ2/9: Depression screen PHQ 2/9 11/03/2015 10/16/2015 07/17/2015 04/02/2015 01/16/2015  Decreased Interest 0 3 0 3 1  Down, Depressed, Hopeless 0 3 0 3 1  PHQ - 2 Score 0 6 0 6 2  Altered sleeping - 3 - 2 -  Tired, decreased energy - 3 - 3 -  Change in appetite - 0 - 1 -  Feeling bad or failure about yourself  - 0 - 2 -  Trouble concentrating - 0 - 0 -  Moving slowly or fidgety/restless - 0 - 1 -  Suicidal thoughts - 0 - 0 -  PHQ-9 Score - 12 - 15 -  Difficult doing work/chores - Not difficult at all - Not difficult at all -    Fall Risk: Fall Risk  11/03/2015 10/16/2015 07/17/2015 04/02/2015 01/16/2015  Falls in the past year? No No No No No    Functional Status Survey: Is the patient deaf or have difficulty hearing?: No Does the patient have difficulty seeing, even when wearing glasses/contacts?: No Does   the patient have difficulty concentrating, remembering, or making decisions?: No Does the patient have difficulty walking or climbing stairs?: No Does the patient have difficulty dressing or bathing?: No Does the patient have difficulty doing errands alone such as visiting a  doctor's office or shopping?: No    Assessment & Plan  1. Sternocleidomastoid muscle tenderness  Advised topical medication, massage area, and take Tizanidine.   2. GAD (generalized anxiety disorder)  - venlafaxine XR (EFFEXOR-XR) 75 MG 24 hr capsule; Take 1 capsule (75 mg total) by mouth daily with breakfast. First week take one daily after that 2 daily in place of Cymbalta  Dispense: 30 capsule; Refill: 5  3. Decreased calculated GFR  We will recheck next visit, avoid nsaid's  4. Chronic recurrent major depressive disorder ([place])  Doing well now, continue medication  - venlafaxine XR (EFFEXOR-XR) 75 MG 24 hr capsule; Take 1 capsule (75 mg total) by mouth daily with breakfast. First week take one daily after that 2 daily in place of Cymbalta  Dispense: 30 capsule; Refill: 5 - divalproex (DEPAKOTE) 250 MG DR tablet; Take 1 tablet (250 mg total) by mouth at bedtime.  Dispense: 30 tablet; Refill: 5

## 2015-11-03 NOTE — Telephone Encounter (Signed)
Refill request was sent to Dr. Krichna Sowles for approval and submission.  

## 2015-11-03 NOTE — Telephone Encounter (Signed)
Patient has an appt today at 11:20am

## 2015-11-11 ENCOUNTER — Other Ambulatory Visit: Payer: Self-pay | Admitting: Family Medicine

## 2015-11-24 ENCOUNTER — Ambulatory Visit: Payer: 59 | Admitting: Family Medicine

## 2015-12-29 ENCOUNTER — Telehealth: Payer: Self-pay | Admitting: Family Medicine

## 2015-12-29 DIAGNOSIS — L409 Psoriasis, unspecified: Secondary | ICD-10-CM

## 2015-12-29 NOTE — Telephone Encounter (Signed)
Requesting refill on Desonide 0.05% cream and Clobetasol 0.05% cream. Please send to [facility]. She is requesting a larger tube for both medications

## 2015-12-30 MED ORDER — DESONIDE 0.05 % EX CREA: TOPICAL_CREAM | Freq: Two times a day (BID) | CUTANEOUS | Status: DC

## 2015-12-30 MED ORDER — CLOBETASOL PROPIONATE 0.05 % EX CREA: TOPICAL_CREAM | Freq: Two times a day (BID) | CUTANEOUS | Status: DC

## 2015-12-30 NOTE — Telephone Encounter (Signed)
done

## 2016-01-12 ENCOUNTER — Encounter: Payer: Self-pay | Admitting: Family Medicine

## 2016-01-12 ENCOUNTER — Other Ambulatory Visit: Payer: Self-pay | Admitting: Family Medicine

## 2016-01-12 ENCOUNTER — Ambulatory Visit (INDEPENDENT_AMBULATORY_CARE_PROVIDER_SITE_OTHER): Payer: 59 | Admitting: Family Medicine

## 2016-01-12 VITALS — BP 120/68 | HR 73 | Temp 98.6°F | Resp 16 | Ht 64.0 in | Wt 173.6 lb

## 2016-01-12 DIAGNOSIS — E781 Pure hyperglyceridemia: Secondary | ICD-10-CM

## 2016-01-12 DIAGNOSIS — K219 Gastro-esophageal reflux disease without esophagitis: Secondary | ICD-10-CM

## 2016-01-12 DIAGNOSIS — G8929 Other chronic pain: Secondary | ICD-10-CM

## 2016-01-12 DIAGNOSIS — E8881 Metabolic syndrome: Secondary | ICD-10-CM | POA: Diagnosis not present

## 2016-01-12 DIAGNOSIS — R599 Enlarged lymph nodes, unspecified: Secondary | ICD-10-CM

## 2016-01-12 DIAGNOSIS — M542 Cervicalgia: Secondary | ICD-10-CM | POA: Diagnosis not present

## 2016-01-12 DIAGNOSIS — R590 Localized enlarged lymph nodes: Secondary | ICD-10-CM

## 2016-01-12 DIAGNOSIS — D692 Other nonthrombocytopenic purpura: Secondary | ICD-10-CM

## 2016-01-12 DIAGNOSIS — G4733 Obstructive sleep apnea (adult) (pediatric): Secondary | ICD-10-CM | POA: Diagnosis not present

## 2016-01-12 DIAGNOSIS — L409 Psoriasis, unspecified: Secondary | ICD-10-CM | POA: Diagnosis not present

## 2016-01-12 DIAGNOSIS — F339 Major depressive disorder, recurrent, unspecified: Secondary | ICD-10-CM

## 2016-01-12 DIAGNOSIS — M5416 Radiculopathy, lumbar region: Secondary | ICD-10-CM | POA: Diagnosis not present

## 2016-01-12 DIAGNOSIS — I10 Essential (primary) hypertension: Secondary | ICD-10-CM

## 2016-01-12 DIAGNOSIS — Z1239 Encounter for other screening for malignant neoplasm of breast: Secondary | ICD-10-CM

## 2016-01-12 DIAGNOSIS — F411 Generalized anxiety disorder: Secondary | ICD-10-CM

## 2016-01-12 MED ORDER — HYDROCODONE-ACETAMINOPHEN 7.5-325 MG PO TABS: 1.0000 | ORAL_TABLET | Freq: Two times a day (BID) | ORAL | Status: DC

## 2016-01-12 MED ORDER — VENLAFAXINE HCL ER 75 MG PO CP24: 75.0000 mg | ORAL_CAPSULE | Freq: Every day | ORAL | Status: DC

## 2016-01-12 MED ORDER — AMOXICILLIN-POT CLAVULANATE 875-125 MG PO TABS: 1.0000 | ORAL_TABLET | Freq: Two times a day (BID) | ORAL | Status: DC

## 2016-01-12 MED ORDER — ALPRAZOLAM ER 1 MG PO TB24: 1.0000 mg | ORAL_TABLET | Freq: Every day | ORAL | Status: DC

## 2016-01-12 NOTE — Progress Notes (Signed)
Name: Keelee Yankey   MRN: [id number]    DOB: June 04, 1956   Date:01/12/2016       Progress Note  Subjective  Chief Complaint  Chief Complaint  Patient presents with  . Follow-up    3 month F/U  . GAD    Started patient on Effexor last visit and patient is doing well on medication  . Hypertension  . Neck Pain    Unchanged  . Gastroesophageal Reflux    Well controlled  . Constipation    Still used Miralax as needed goes to bathroom everyother day    HPI  Neck pain: It has been worse over the past few months, she was sent back to [facility]. She had a steroid injection injection for her facet arthropathy but there was no improvement of the pain. She was advised to try otc nsaid's and continue hydrocodone. Pain is described as sharp and throbbing pain, sometimes burning, currently a 3/10 and currently feels like an achy pain  Chronic low back pain: she was seen by [facility], history of lumbar spine surgery and had injections by Dr. Arneta Cliche in the past with improvement of symptoms, pain is gradually getting but denies radiculitis, she will follow up with Dr. Arneta Cliche when she feels it is appropriate.  GFR decrease: seen at [facility] yesterday for neck pain and GFR was lower than normal, she denies taking NSAID's. We will monitor. Normal urine volume. She asked to wait to have labs done.   GAD/Depression Major: currently on Effexor and Depakote for the past month, because Abilify and Cymbalta was too expenisve- no longer could afford. No longer has crying spells, anhedonia is under control, no longer sleeping all the time, no longer has fatigue. Medication is working and she is happy on the current dose of medication, she does not want to go up on Effexor.   GERD: under control, taking medication daily, discussed risk of long term use of PPI. She states she is not able to stop medication because it causes epigastric pain when off medication.   Chronic  constipation: doing well on Miralax prn.   Lymphadenopathy: she noticed a tender lump on left inguinal area for the past few weeks, slightly larger in size, no change in sexual partner, rash on vulva is chronic and secondary to psoriasis. No vaginal discharge, no leg injuries or lesions. No redness or increase in warmth. No drainage.   Psoriasis: using topical medication but symptoms are getting worse, more spread out, used to see Dr. Nehemiah Massed but prefers going to [place] facility.     Patient Active Problem List   Diagnosis Date Noted  . Chronic constipation 10/16/2015  . History of fusion of cervical spine 04/02/2015  . Benign hypertension 01/16/2015  . Chronic cervical pain 01/16/2015  . CN (constipation) 01/16/2015  . Gastric reflux 01/16/2015  . Hypertriglyceridemia 01/16/2015  . Edema leg 01/16/2015  . Chronic recurrent major depressive disorder ([place]) 01/16/2015  . Dysmetabolic syndrome 19/14/7829  . Obstructive apnea 01/16/2015  . Psoriasis 01/16/2015  . Allergic rhinitis 01/16/2015  . Bursitis, trochanteric 01/16/2015  . GAD (generalized anxiety disorder) 01/16/2015  . Tachycardia, paroxysmal [facility])     Past Surgical History  Procedure Laterality Date  . Tubal ligation      s/p  . Tonsillectomy      history  . Herniated disc repair    . Anterior fusion lumbar spine    . Cardiac catheterization  2009?     [facility]  .  Cardiac catheterization  05/2012    RHC: Mild hypertension 37/12 with a mean pressure of 21 mm mercury    Family History  Problem Relation Age of Onset  . Heart attack Mother     Social History   Social History  . Marital Status: Married    Spouse Name: N/A  . Number of Children: N/A  . Years of Education: N/A   Occupational History  . Not on file.   Social History Main Topics  . Smoking status: Former Smoker -- 1.00 packs/day for 32 years    Types: Cigarettes    Start date: 08/10/1963    Quit date: 01/15/2006  . Smokeless tobacco:  Never Used  . Alcohol Use: No  . Drug Use: No  . Sexual Activity: No   Other Topics Concern  . Not on file   Social History Narrative     Current outpatient prescriptions:  .  ALPRAZolam (XANAX XR) 1 MG 24 hr tablet, Take 1 tablet (1 mg total) by mouth daily., Disp: 30 tablet, Rfl: 2 .  clobetasol cream (TEMOVATE) 0.05 %, Apply topically 2 (two) times daily., Disp: 60 g, Rfl: 2 .  desonide (DESOWEN) 0.05 % cream, Apply topically 2 (two) times daily., Disp: 60 g, Rfl: 2 .  divalproex (DEPAKOTE) 250 MG DR tablet, Take 1 tablet (250 mg total) by mouth at bedtime., Disp: 30 tablet, Rfl: 5 .  gabapentin (NEURONTIN) 300 MG capsule, TAKE 1 CAPSULE TWICE A DAY, Disp: 180 capsule, Rfl: 1 .  gemfibrozil (LOPID) 600 MG tablet, Take 1 tablet (600 mg total) by mouth daily., Disp: 30 tablet, Rfl: 5 .  hydrochlorothiazide (MICROZIDE) 12.5 MG capsule, Take 1 capsule (12.5 mg total) by mouth daily., Disp: 90 capsule, Rfl: 1 .  HYDROcodone-acetaminophen (NORCO) 7.5-325 MG tablet, Take 1 tablet by mouth 2 (two) times daily., Disp: 60 tablet, Rfl: 0 .  HYDROcodone-acetaminophen (NORCO) 7.5-325 MG tablet, Take 1 tablet by mouth 2 (two) times daily., Disp: 60 tablet, Rfl: 0 .  HYDROcodone-acetaminophen (NORCO) 7.5-325 MG tablet, Take 1 tablet by mouth 2 (two) times daily., Disp: 60 tablet, Rfl: 0 .  metoprolol (LOPRESSOR) 50 MG tablet, Take 1 tablet (50 mg total) by mouth 2 (two) times daily., Disp: 180 tablet, Rfl: 1 .  omeprazole (PRILOSEC) 40 MG capsule, TAKE 1 CAPSULE EVERY DAY, Disp: 30 capsule, Rfl: 5 .  tiZANidine (ZANAFLEX) 4 MG tablet, TAKE 1 TABLET (4 MG TOTAL) BY MOUTH 3 (THREE) TIMES DAILY., Disp: 90 tablet, Rfl: 2 .  venlafaxine XR (EFFEXOR-XR) 75 MG 24 hr capsule, Take 1 capsule (75 mg total) by mouth daily with breakfast. Daily, Disp: 30 capsule, Rfl: 0 .  vitamin E 400 UNIT capsule, Take 1 capsule by mouth 2 (two) times daily., Disp: , Rfl:  .  amoxicillin-clavulanate (AUGMENTIN) 875-125 MG  tablet, Take 1 tablet by mouth 2 (two) times daily., Disp: 20 tablet, Rfl: 0 .  fluticasone (FLONASE) 50 MCG/ACT nasal spray, Place 2 sprays into both nostrils daily. (Patient not taking: Reported on 01/12/2016), Disp: 16 g, Rfl: 6 .  polyethylene glycol powder (GLYCOLAX/MIRALAX) powder, Take 17 g by mouth daily., Disp: 3350 g, Rfl: 2  Allergies  Allergen Reactions  . Sulfa Antibiotics      ROS  Constitutional: Negative for fever or weight change.  Respiratory: Negative for cough and shortness of breath.   Cardiovascular: Negative for chest pain or palpitations.  Gastrointestinal: Negative for abdominal pain, no bowel changes.  Musculoskeletal: Negative for gait problem or joint swelling.  Skin: Positive  for rash.  Neurological: Negative for dizziness or headache.  No other specific complaints in a complete review of systems (except as listed in HPI above).  Objective  Filed Vitals:   01/12/16 1334  BP: 120/68  Pulse: 73  Temp: 98.6 F (37 C)  TempSrc: Oral  Resp: 16  Height: '5\' 4"'  (1.626 m)  Weight: 173 lb 9.6 oz (78.744 kg)  SpO2: 96%    Body mass index is 29.78 kg/(m^2).  Physical Exam  Constitutional: Patient appears well-developed and well-nourished. Obese  No distress.  HEENT: head atraumatic, normocephalic, pupils equal and reactive to light,  neck supple, but still has decrease rom and tender with palpation, throat within normal limits Cardiovascular: Normal rate, regular rhythm and normal heart sounds.  No murmur heard. No BLE edema. Pulmonary/Chest: Effort normal and breath sounds normal. No respiratory distress. Abdominal: Soft.  There is no tenderness. She has left inguinal lymphadenopathy that is tender to touch, no redness or increase in warmth Psychiatric: Patient has a normal mood and affect. behavior is normal. Judgment and thought content normal. Skin: psoriatic plaques on arms, back, vulva, under breast, also has some purpura on both arms  Recent  Results (from the past 2160 hour(s))  Basic metabolic panel     Status: Abnormal   Collection Time: 11/02/15 10:43 AM  Result Value Ref Range   Sodium 136 135 - 145 mmol/L   Potassium 3.6 3.5 - 5.1 mmol/L   Chloride 98 (L) 101 - 111 mmol/L   CO2 29 22 - 32 mmol/L   Glucose, Bld 105 (H) 65 - 99 mg/dL   BUN 11 6 - 20 mg/dL   Creatinine, Ser 1.19 (H) 0.44 - 1.00 mg/dL   Calcium 9.2 8.9 - 10.3 mg/dL   GFR calc non Af Amer 49 (L) >60 mL/min   GFR calc Af Amer 56 (L) >60 mL/min    Comment: (NOTE) The eGFR has been calculated using the CKD EPI equation. This calculation has not been validated in all clinical situations. eGFR's persistently <60 mL/min signify possible Chronic Kidney Disease.    Anion gap 9 5 - 15  CBC with Differential     Status: None   Collection Time: 11/02/15 10:43 AM  Result Value Ref Range   WBC 7.5 3.6 - 11.0 K/uL   RBC 4.34 3.80 - 5.20 MIL/uL   Hemoglobin 13.0 12.0 - 16.0 g/dL   HCT 38.0 35.0 - 47.0 %   MCV 87.6 80.0 - 100.0 fL   MCH 29.9 26.0 - 34.0 pg   MCHC 34.1 32.0 - 36.0 g/dL   RDW 14.3 11.5 - 14.5 %   Platelets 238 150 - 440 K/uL   Neutrophils Relative % 65 %   Neutro Abs 4.9 1.4 - 6.5 K/uL   Lymphocytes Relative 24 %   Lymphs Abs 1.8 1.0 - 3.6 K/uL   Monocytes Relative 8 %   Monocytes Absolute 0.6 0.2 - 0.9 K/uL   Eosinophils Relative 2 %   Eosinophils Absolute 0.2 0 - 0.7 K/uL   Basophils Relative 1 %   Basophils Absolute 0.1 0 - 0.1 K/uL  Mononucleosis screen     Status: None   Collection Time: 11/02/15 10:43 AM  Result Value Ref Range   Mono Screen NEGATIVE NEGATIVE  POCT rapid strep A [facility])     Status: None   Collection Time: 11/02/15 10:53 AM  Result Value Ref Range   Streptococcus, Group A Screen (Direct) NEGATIVE NEGATIVE  Urinalysis complete, with microscopic     Status: Abnormal   Collection Time: 11/02/15 12:27 PM  Result Value Ref Range   Color, Urine STRAW (A) YELLOW   APPearance CLEAR (A) CLEAR   Glucose, UA  NEGATIVE NEGATIVE mg/dL   Bilirubin Urine NEGATIVE NEGATIVE   Ketones, ur NEGATIVE NEGATIVE mg/dL   Specific Gravity, Urine 1.008 1.005 - 1.030   Hgb urine dipstick NEGATIVE NEGATIVE   pH 7.0 5.0 - 8.0   Protein, ur NEGATIVE NEGATIVE mg/dL   Nitrite NEGATIVE NEGATIVE   Leukocytes, UA TRACE (A) NEGATIVE   RBC / HPF NONE SEEN 0 - 5 RBC/hpf   WBC, UA 0-5 0 - 5 WBC/hpf   Bacteria, UA NONE SEEN NONE SEEN   Squamous Epithelial / LPF 0-5 (A) NONE SEEN  Urine culture     Status: None   Collection Time: 11/02/15 12:27 PM  Result Value Ref Range   Specimen Description URINE, RANDOM    Special Requests none    Culture MULTIPLE SPECIES PRESENT, SUGGEST RECOLLECTION    Report Status 11/03/2015 FINAL       PHQ2/9: Depression screen [facility] 2/9 01/12/2016 11/03/2015 10/16/2015 07/17/2015 04/02/2015  Decreased Interest 0 0 3 0 3  Down, Depressed, Hopeless 0 0 3 0 3  PHQ - 2 Score 0 0 6 0 6  Altered sleeping - - 3 - 2  Tired, decreased energy - - 3 - 3  Change in appetite - - 0 - 1  Feeling bad or failure about yourself  - - 0 - 2  Trouble concentrating - - 0 - 0  Moving slowly or fidgety/restless - - 0 - 1  Suicidal thoughts - - 0 - 0  PHQ-9 Score - - 12 - 15  Difficult doing work/chores - - Not difficult at all - Not difficult at all     Fall Risk: Fall Risk  01/12/2016 11/03/2015 10/16/2015 07/17/2015 04/02/2015  Falls in the past year? No No No No No     Functional Status Survey: Is the patient deaf or have difficulty hearing?: No Does the patient have difficulty seeing, even when wearing glasses/contacts?: No Does the patient have difficulty concentrating, remembering, or making decisions?: No Does the patient have difficulty walking or climbing stairs?: No Does the patient have difficulty dressing or bathing?: No Does the patient have difficulty doing errands alone such as visiting a doctor's office or shopping?: No    Assessment & Plan  1. Benign hypertension  At goal, doing  well  2. GAD (generalized anxiety disorder)  Discussed risk of BZD with narcotics, patient is aware of risk of death - ALPRAZolam (XANAX XR) 1 MG 24 hr tablet; Take 1 tablet (1 mg total) by mouth daily.  Dispense: 30 tablet; Refill: 2 - venlafaxine XR (EFFEXOR-XR) 75 MG 24 hr capsule; Take 1 capsule (75 mg total) by mouth daily with breakfast. Daily  Dispense: 30 capsule; Refill: 0  3. Chronic recurrent major depressive disorder (HCC)  - venlafaxine XR (EFFEXOR-XR) 75 MG 24 hr capsule; Take 1 capsule (75 mg total) by mouth daily with breakfast. Daily  Dispense: 30 capsule; Refill: 0  4. Obstructive apnea  Never used, could not breath with it, explained increase risk of cardiovascular death.  5. Chronic cervical pain  Recently seen by [facility], advised to continue medication  - HYDROcodone-acetaminophen (NORCO) 7.5-325 MG tablet; Take 1 tablet by mouth 2 (two) times daily.  Dispense: 60 tablet; Refill: 0 - HYDROcodone-acetaminophen (NORCO) 7.5-325 MG  tablet; Take 1 tablet by mouth 2 (two) times daily.  Dispense: 60 tablet; Refill: 0 - HYDROcodone-acetaminophen (NORCO) 7.5-325 MG tablet; Take 1 tablet by mouth 2 (two) times daily.  Dispense: 60 tablet; Refill: 0  6. Psoriasis  - Ambulatory referral to Dermatology  7. Gastric reflux  Stable, she is aware of risk of osteoporosis, h. Pylori infection, c.difi colitis and Alzheimer's dementia with prolonged use of PPI  8. Dysmetabolic syndrome  Discussed life style modification   9. Hypertriglyceridemia  Continue medication and diet  10. Left lumbar radiculitis  resolved  11. Lymphadenopathy, inguinal  We will try a round of antibiotics and if no improvement or resolution within the next 2 weeks she will call back for referral to surgeon - amoxicillin-clavulanate (AUGMENTIN) 875-125 MG tablet; Take 1 tablet by mouth 2 (two) times daily.  Dispense: 20 tablet; Refill: 0  12. Non-thrombocytopenic purpura  ([place])

## 2016-01-12 NOTE — Addendum Note (Signed)
Addended by: Johnnette Litter A on: 01/12/2016 02:25 PM   Modules accepted: Orders

## 2016-01-12 NOTE — Telephone Encounter (Signed)
Patient requesting refill. 

## 2016-01-20 ENCOUNTER — Ambulatory Visit: Admission: RE | Admit: 2016-01-20 | Payer: 59 | Source: Ambulatory Visit

## 2016-01-30 ENCOUNTER — Telehealth: Payer: Self-pay | Admitting: Family Medicine

## 2016-01-30 NOTE — Telephone Encounter (Signed)
FYI: Pt wanted Dr Ancil Boozer to know that the knot in her groin area has gotten worse.

## 2016-01-31 ENCOUNTER — Other Ambulatory Visit: Payer: Self-pay | Admitting: Family Medicine

## 2016-01-31 DIAGNOSIS — R590 Localized enlarged lymph nodes: Secondary | ICD-10-CM

## 2016-02-02 ENCOUNTER — Encounter: Payer: Self-pay | Admitting: *Deleted

## 2016-02-02 NOTE — Progress Notes (Signed)
Referral faxed over to [facility] and they will review then contact patient with appt details.

## 2016-02-04 ENCOUNTER — Ambulatory Visit
Admission: RE | Admit: 2016-02-04 | Discharge: 2016-02-04 | Disposition: A | Discharge status: Home / Self Care | Payer: 59 | Source: Ambulatory Visit | Attending: Family Medicine | Admitting: Family Medicine

## 2016-02-04 DIAGNOSIS — Z1231 Encounter for screening mammogram for malignant neoplasm of breast: Secondary | ICD-10-CM | POA: Diagnosis present

## 2016-02-04 DIAGNOSIS — Z1239 Encounter for other screening for malignant neoplasm of breast: Secondary | ICD-10-CM

## 2016-02-06 ENCOUNTER — Other Ambulatory Visit: Payer: Self-pay | Admitting: Family Medicine

## 2016-02-06 DIAGNOSIS — R928 Other abnormal and inconclusive findings on diagnostic imaging of breast: Secondary | ICD-10-CM

## 2016-02-12 ENCOUNTER — Ambulatory Visit
Admission: RE | Admit: 2016-02-12 | Discharge: 2016-02-12 | Disposition: A | Discharge status: Home / Self Care | Payer: 59 | Source: Ambulatory Visit | Attending: Family Medicine | Admitting: Family Medicine

## 2016-02-12 DIAGNOSIS — R928 Other abnormal and inconclusive findings on diagnostic imaging of breast: Secondary | ICD-10-CM

## 2016-02-16 ENCOUNTER — Other Ambulatory Visit: Payer: Self-pay | Admitting: Family Medicine

## 2016-02-16 NOTE — Telephone Encounter (Signed)
Patient requesting refill. 

## 2016-02-17 ENCOUNTER — Other Ambulatory Visit: Payer: Self-pay | Admitting: Family Medicine

## 2016-02-17 DIAGNOSIS — F411 Generalized anxiety disorder: Secondary | ICD-10-CM

## 2016-02-17 NOTE — Telephone Encounter (Signed)
Refill request was sent to Dr. Krichna Sowles for approval and submission.  

## 2016-02-17 NOTE — Telephone Encounter (Signed)
Patient called needing a refill on Xanax XR 23m.  Patient took her last one on today.  Please contact patient once complete.

## 2016-02-18 ENCOUNTER — Other Ambulatory Visit: Payer: Self-pay

## 2016-02-18 ENCOUNTER — Ambulatory Visit (INDEPENDENT_AMBULATORY_CARE_PROVIDER_SITE_OTHER): Payer: 59 | Admitting: General Surgery

## 2016-02-18 ENCOUNTER — Encounter: Payer: Self-pay | Admitting: General Surgery

## 2016-02-18 VITALS — BP 120/82 | HR 74 | Resp 12 | Ht 64.0 in | Wt 174.0 lb

## 2016-02-18 DIAGNOSIS — R1909 Other intra-abdominal and pelvic swelling, mass and lump: Secondary | ICD-10-CM | POA: Diagnosis not present

## 2016-02-18 NOTE — Progress Notes (Signed)
Patient ID: Judy Allen, female   DOB: 03/28/1956, 59 y.o.   MRN: [id number]  Chief Complaint  Patient presents with  . Other    left inguinal adenopathy    HPI Judy Allen is a 59 y.o. female here today for a evaluation of a knot in left groin. She states it has been there for 3 months. She states it is painful and getting larger. Bowels move regular, every other day, no bleeding. She did take prednisone about 1 month ago but it did not change the size of the knot. I have reviewed the history of present illness with the patient.   HPI  Past Medical History  Diagnosis Date  . H/O allergic rhinitis   . Neuritis   . Urinary incontinence   . Proteinuria   . GERD (gastroesophageal reflux disease)   . Hyperlipidemia   . Ovarian failure   . Constipation   . Psoriasis   . Heart murmur   . Tachycardia, paroxysmal (HCC)   . Hypertension   . Hemorrhoid     Past Surgical History  Procedure Laterality Date  . Tubal ligation      s/p  . Tonsillectomy  1959    history  . Herniated disc repair    . Anterior fusion lumbar spine    . Cardiac catheterization  2009?     [facility]  . Cardiac catheterization  05/2012    RHC: Mild hypertension 37/12 with a mean pressure of 21 mm mercury    Family History  Problem Relation Age of Onset  . Heart attack Mother   . Breast cancer Maternal Aunt     Social History Social History  Substance Use Topics  . Smoking status: Former Smoker -- 1.00 packs/day for 32 years    Types: Cigarettes    Start date: 08/10/1963    Quit date: 01/15/2006  . Smokeless tobacco: Never Used  . Alcohol Use: No    Allergies  Allergen Reactions  . Sulfa Antibiotics     Current Outpatient Prescriptions  Medication Sig Dispense Refill  . ALPRAZolam (XANAX XR) 1 MG 24 hr tablet Take 1 tablet (1 mg total) by mouth daily. 30 tablet 2  . clobetasol cream (TEMOVATE) 0.05 % Apply topically 2 (two) times daily. 60 g 2  . desonide (DESOWEN) 0.05 %  cream Apply topically 2 (two) times daily. 60 g 2  . divalproex (DEPAKOTE) 250 MG DR tablet Take 1 tablet (250 mg total) by mouth at bedtime. 30 tablet 5  . fluticasone (FLONASE) 50 MCG/ACT nasal spray Place 2 sprays into both nostrils daily. 16 g 6  . gabapentin (NEURONTIN) 300 MG capsule TAKE 1 CAPSULE TWICE A DAY 180 capsule 1  . gemfibrozil (LOPID) 600 MG tablet Take 1 tablet (600 mg total) by mouth daily. 30 tablet 5  . hydrochlorothiazide (MICROZIDE) 12.5 MG capsule Take 1 capsule (12.5 mg total) by mouth daily. 90 capsule 1  . HYDROcodone-acetaminophen (NORCO/VICODIN) 5-325 MG tablet Take 1 tablet by mouth 2 (two) times daily.    . metoprolol (LOPRESSOR) 50 MG tablet Take 1 tablet (50 mg total) by mouth 2 (two) times daily. 180 tablet 1  . omeprazole (PRILOSEC) 40 MG capsule TAKE 1 CAPSULE EVERY DAY 30 capsule 5  . polyethylene glycol powder (GLYCOLAX/MIRALAX) powder Take 17 g by mouth daily. 3350 g 2  . tiZANidine (ZANAFLEX) 4 MG tablet TAKE 1 TABLET (4 MG TOTAL) BY MOUTH 3 (THREE) TIMES DAILY. 90 tablet 2  . venlafaxine XR (  EFFEXOR-XR) 75 MG 24 hr capsule Take 1 capsule (75 mg total) by mouth daily with breakfast. Daily 30 capsule 0  . vitamin E 400 UNIT capsule Take 1 capsule by mouth 2 (two) times daily.     No current facility-administered medications for this visit.    Review of Systems Review of Systems  Constitutional: Negative.   Respiratory: Negative.   Cardiovascular: Negative.   Gastrointestinal: Negative for nausea, vomiting, diarrhea and constipation.    Blood pressure 120/82, pulse 74, resp. rate 12, height 5' 4" (1.626 m), weight 174 lb (78.926 kg).  Physical Exam Physical Exam  Constitutional: She is oriented to person, place, and time. She appears well-developed and well-nourished.  HENT:  Mouth/Throat: Oropharynx is clear and moist.  Eyes: Conjunctivae are normal. No scleral icterus.  Neck: Neck supple.  Cardiovascular: Normal rate and regular rhythm.    Pulmonary/Chest: Effort normal and breath sounds normal.  Abdominal: Soft. Normal appearance and bowel sounds are normal. There is no splenomegaly or hepatomegaly. There is tenderness.  3-4 cm firm non mobile mass to the left of the pubis.   Lymphadenopathy:    She has no cervical adenopathy.    She has no axillary adenopathy.       Left: Inguinal adenopathy present.  Neurological: She is alert and oriented to person, place, and time.  Skin: Skin is warm and dry.  Psychiatric: Her behavior is normal.    Data Reviewed Notes reviewed  Assessment    3-4 cm firm non mobile mass to the left of the pubis. Ultrasound preformed-shows a large hypoechoic mass with some hyperechoic  areas in middle.  Central vascularity noted. Possible this is a femoral hernia.       Plan    Recommend excison of the left groin mass or repair femoral hernia depending on operative finding-patient agrees.    Patient's surgery has been scheduled for 02-23-16 at [facility].  PCP:  Sowles, Krichna This information has been scribed by Jessica Qualls CMA.   Claudio Mondry G 02/18/2016, 5:02 PM    

## 2016-02-18 NOTE — Patient Instructions (Signed)
The patient is aware to call back for any questions or concerns.  

## 2016-02-19 ENCOUNTER — Encounter: Payer: Self-pay | Admitting: *Deleted

## 2016-02-19 ENCOUNTER — Other Ambulatory Visit: Payer: 59

## 2016-02-19 NOTE — Patient Instructions (Signed)
  Your procedure is scheduled on: 02-23-16 [facility]) Report to Same Day Surgery 2nd floor medical mall To find out your arrival time please call [phone] between 1PM - 3PM on 02-20-16 (FRIDAY)  Remember: Instructions that are not followed completely may result in serious medical risk, up to and including death, or upon the discretion of your surgeon and anesthesiologist your surgery may need to be rescheduled.    _x___ 1. Do not eat food or drink liquids after midnight. No gum chewing or hard candies.     __x__ 2. No Alcohol for 24 hours before or after surgery.   __x__3. No Smoking for 24 prior to surgery.   ____  4. Bring all medications with you on the day of surgery if instructed.    __x__ 5. Notify your doctor if there is any change in your medical condition     (cold, fever, infections).     Do not wear jewelry, make-up, hairpins, clips or nail polish.  Do not wear lotions, powders, or perfumes. You may wear deodorant.  Do not shave 48 hours prior to surgery. Men may shave face and neck.  Do not bring valuables to the hospital.    [facility] is not responsible for any belongings or valuables.               Contacts, dentures or bridgework may not be worn into surgery.  Leave your suitcase in the car. After surgery it may be brought to your room.  For patients admitted to the hospital, discharge time is determined by your treatment team.   Patients discharged the day of surgery will not be allowed to drive home.    Please read over the following fact sheets that you were given:   [facility] Preparing for Surgery and or MRSA Information   _x___ Take these medicines the morning of surgery with A SIP OF WATER:    1. GABAPENTIN (NEURONTIN)  2. GEMFIBROZIL (LOPID)  3. METOPRLOL  4. EFFEXOR (VENLAFAXINE)  5. OMEPRAZOLE (PRILOSEC)  6. TAKE AN EXTRA PRILOSEC ON Sunday NIGHT BEFORE BED  ____ Fleet Enema (as directed)   _x___ Use CHG Soap or sage wipes as directed on  instruction sheet   ____ Use inhalers on the day of surgery and bring to hospital day of surgery  ____ Stop metformin 2 days prior to surgery    ____ Take 1/2 of usual insulin dose the night before surgery and none on the morning of  surgery.   ____ Stop aspirin or coumadin, or plavix  _x__ Stop Anti-inflammatories such as Advil, Aleve, Ibuprofen, Motrin, Naproxen,          Naprosyn, Goodies powders or aspirin products. Ok to take HYDROCODONE   _X___ Stop supplements until after surgery-STOP VITAMIN E NOW   ____ Bring C-Pap to the hospital.

## 2016-02-20 ENCOUNTER — Encounter
Admission: RE | Admit: 2016-02-20 | Discharge: 2016-02-20 | Disposition: A | Discharge status: Home / Self Care | Payer: Commercial Managed Care - HMO | Source: Ambulatory Visit | Attending: General Surgery | Admitting: General Surgery

## 2016-02-20 DIAGNOSIS — Z79899 Other long term (current) drug therapy: Secondary | ICD-10-CM | POA: Diagnosis not present

## 2016-02-20 DIAGNOSIS — L409 Psoriasis, unspecified: Secondary | ICD-10-CM | POA: Diagnosis not present

## 2016-02-20 DIAGNOSIS — C774 Secondary and unspecified malignant neoplasm of inguinal and lower limb lymph nodes: Secondary | ICD-10-CM | POA: Diagnosis not present

## 2016-02-20 DIAGNOSIS — Z981 Arthrodesis status: Secondary | ICD-10-CM | POA: Diagnosis not present

## 2016-02-20 DIAGNOSIS — Z87891 Personal history of nicotine dependence: Secondary | ICD-10-CM | POA: Diagnosis not present

## 2016-02-20 DIAGNOSIS — I10 Essential (primary) hypertension: Secondary | ICD-10-CM | POA: Diagnosis not present

## 2016-02-20 DIAGNOSIS — Z9889 Other specified postprocedural states: Secondary | ICD-10-CM | POA: Diagnosis not present

## 2016-02-20 DIAGNOSIS — E2839 Other primary ovarian failure: Secondary | ICD-10-CM | POA: Diagnosis not present

## 2016-02-20 DIAGNOSIS — Z882 Allergy status to sulfonamides status: Secondary | ICD-10-CM | POA: Diagnosis not present

## 2016-02-20 DIAGNOSIS — Z7951 Long term (current) use of inhaled steroids: Secondary | ICD-10-CM | POA: Diagnosis not present

## 2016-02-20 DIAGNOSIS — K219 Gastro-esophageal reflux disease without esophagitis: Secondary | ICD-10-CM | POA: Diagnosis not present

## 2016-02-20 DIAGNOSIS — E785 Hyperlipidemia, unspecified: Secondary | ICD-10-CM | POA: Diagnosis not present

## 2016-02-20 DIAGNOSIS — Z9851 Tubal ligation status: Secondary | ICD-10-CM | POA: Diagnosis not present

## 2016-02-20 DIAGNOSIS — Z803 Family history of malignant neoplasm of breast: Secondary | ICD-10-CM | POA: Diagnosis not present

## 2016-02-20 DIAGNOSIS — Z8249 Family history of ischemic heart disease and other diseases of the circulatory system: Secondary | ICD-10-CM | POA: Diagnosis not present

## 2016-02-20 DIAGNOSIS — R1909 Other intra-abdominal and pelvic swelling, mass and lump: Secondary | ICD-10-CM | POA: Diagnosis present

## 2016-02-20 LAB — BASIC METABOLIC PANEL
ANION GAP: 10 (ref 5–15)
BUN: 12 mg/dL (ref 6–20)
CO2: 29 mmol/L (ref 22–32)
Calcium: 9.3 mg/dL (ref 8.9–10.3)
Chloride: 101 mmol/L (ref 101–111)
Creatinine, Ser: 0.99 mg/dL (ref 0.44–1.00)
GFR calc Af Amer: >60 mL/min (ref >60)
GFR calc non Af Amer: >60 mL/min (ref >60)
GLUCOSE: 95 mg/dL (ref 65–99)
POTASSIUM: 3.2 mmol/L — AB (ref 3.5–5.1)
Sodium: 140 mmol/L (ref 135–145)

## 2016-02-20 NOTE — Pre-Procedure Instructions (Signed)
pts K+ 3.2- Dr Jacalyn Lefevre office closed at 1 pm-I tried their back line and got vm-I did leave a message but no one ever returned my call-paged Dr Jamal Collin and am waiting on him to return my call

## 2016-02-20 NOTE — Pre-Procedure Instructions (Signed)
ROSE FROM THE OR CALLED AND WAS IN SURGERY WITH DR Lavone Neri SMITH-SHE SAID THAT DR Tamala Julian GOT PAGED REGARDING THIS PT AND HER LOW POTASSIUM-I TOLD ROSE THIS WAS DR Jacalyn Lefevre PT-ROSE SAID THAT IS WHY THEY COULD NOT FIND PT- I TOLD ROSE THAT I HEARD THAT DR Jamal Collin WAS POSSIBLY OUT OF TOWN AND THAT MAYBE DR Tamala Julian IS COVERING HIS PTS-SHE SAID SHE DID NOT KNOW- I TOLD HER THAT THE ANSWERING SERVICE FROM DR [facility] OFFICE MUST HAVE PUT THE MESSAGE THRU TO DR Tamala Julian PAGER- ROSE WANTED ME TO CALL DR VAN STAVERN ABOUT THE K+-I TOLD HER I WAS NOT GOING TO CALL HIM BC HE WAS NOT GOING TO CALL PT A SUPPLEMENT IN

## 2016-02-20 NOTE — Pre-Procedure Instructions (Signed)
CALLED ANSWERING SERVICE AT DR [facility] OFFICE AND RELAYED THE INFO ABOUT PTS LOW K+-SHE TOOK MY NAME AND # AND SAID THAT SHE WILL RELAY THIS INFO TO DR Jamal Collin

## 2016-02-20 NOTE — Pre-Procedure Instructions (Signed)
PAGED DR Jamal Collin AGAIN AND HE STILL IS NOT RETURNING MY CALL

## 2016-02-20 NOTE — Pre-Procedure Instructions (Signed)
CALLED PT AND INSTRUCTED HER TO EAT FOODS HIGH IN K-BANANNAS, ORANGE JUICE ETC . I TOLD PT HER K+ WILL BE RECHECKED ON Monday-SHE VERBALIZED UNDERSTANDING

## 2016-02-20 NOTE — Pre-Procedure Instructions (Signed)
Judy Hampshire, MD Signed Judy Hampshire, MD 06/26/2013 6:05 PM    Progress Notes    Expand All Collapse All      HPI  This is a pleasant 60 year old female who is here today for a followup visit regarding tachycardia and hypertension. She has been seen in the past by Dr. Humphrey Rolls at [facility] for unspecified tachycardia. She used to be on sotalol for at least 5 years with no recurrent documented arrhythmia. She also has known history of hypertension. She has unspecified lung disease followed by Dr. Raul Del. She had an echocardiogram done in 2013 which showed normal LV systolic function. However, there was evidence of moderate pulmonary hypertension with a systolic pulmonary artery pressure of 56 mm mercury. Thus, she underwent a right heart catheterization which showed only mild pulmonary hypertension with a mean pressure of 21 mm mercury.  Her main issue during initial evaluation was lower extremity edema which was thought to be due to amlodipine. I decided to make changes to her medications. I stopped sotalol and atenolol and switched her to metoprolol. Amlodipine was discontinued and she was switched to losartan/hydrochlorothiazide. She had hypotension after this which required further adjustments in the dose. Blood pressure continues to run on the low side with systolic blood pressure around 100.  Allergies  Allergen Reactions  . Sulfa Antibiotics      Current Outpatient Prescriptions on File Prior to Visit  Medication Sig Dispense Refill  . ABILIFY 5 MG tablet 2.5 mg daily.     Marland Kitchen ALPRAZolam (XANAX) 0.5 MG tablet Take 0.5 mg by mouth at bedtime as needed for sleep.    . clobetasol cream (TEMOVATE) 0.05 % 2 (two) times daily.     Marland Kitchen desonide (DESOWEN) 0.05 % cream 2 (two) times daily.     . diclofenac (VOLTAREN) 75 MG EC tablet Take 75 mg by mouth 2 (two) times daily.     . DULoxetine (CYMBALTA) 30 MG capsule Take  30 mg by mouth 3 (three) times daily.    Marland Kitchen gemfibrozil (LOPID) 600 MG tablet Take 600 mg by mouth daily.    . hydrochlorothiazide (HYDRODIURIL) 12.5 MG tablet Take 1 tablet (12.5 mg total) by mouth daily. 30 tablet 6  . HYDROcodone-acetaminophen (NORCO/VICODIN) 5-325 MG per tablet Take 1 tablet by mouth 3 (three) times daily as needed for pain.    . metoprolol tartrate (LOPRESSOR) 25 MG tablet Take 1 tablet (25 mg total) by mouth 2 (two) times daily. 60 tablet 0  . omeprazole (PRILOSEC) 40 MG capsule Take 40 mg by mouth daily.     Marland Kitchen tiZANidine (ZANAFLEX) 4 MG tablet Take 4 mg by mouth 3 (three) times daily as needed.    . vitamin E 400 UNIT capsule Take 400 Units by mouth daily.     No current facility-administered medications on file prior to visit.     Past Medical History  Diagnosis Date  . H/O allergic rhinitis   . Neuritis   . Urinary incontinence   . Proteinuria   . GERD (gastroesophageal reflux disease)   . Hyperlipidemia   . Ovarian failure   . Constipation   . Psoriasis   . Heart murmur   . Tachycardia, paroxysmal   . Hypertension      Past Surgical History  Procedure Laterality Date  . Tubal ligation      s/p  . Tonsillectomy      history  . Herniated disc repair    . Anterior fusion lumbar  spine    . Cardiac catheterization  2009?     [facility]  . Cardiac catheterization  05/2012    RHC: Mild hypertension 37/12 with a mean pressure of 21 mm mercury     Family History  Problem Relation Age of Onset  . Heart attack Mother      History   Social History  . Marital Status: Married    Spouse Name: N/A    Number of Children: N/A  . Years of Education: N/A   Occupational History  . Not on file.   Social History Main Topics  . Smoking status: Former Smoker -- 1.00 packs/day  for 32 years    Types: Cigarettes  . Smokeless tobacco: Not on file  . Alcohol Use: No  . Drug Use: No  . Sexual Activity: Not on file   Other Topics Concern  . Not on file   Social History Narrative  . No narrative on file    PHYSICAL EXAM   BP 100/62  Pulse 78  Ht 5\' 4"  (1.626 m)  Wt 176 lb 8 oz (80.06 kg)  BMI 30.28 kg/m2 Constitutional: She is oriented to person, place, and time. She appears well-developed and well-nourished. No distress.  HENT: No nasal discharge.  Head: Normocephalic and atraumatic.  Eyes: Pupils are equal and round. Right eye exhibits no discharge. Left eye exhibits no discharge.  Neck: Normal range of motion. Neck supple. No JVD present. No thyromegaly present.  Cardiovascular: Normal rate, regular rhythm, normal heart sounds. Exam reveals no gallop and no friction rub. No murmur heard.  Pulmonary/Chest: Effort normal and breath sounds normal. No stridor. No respiratory distress. She has no wheezes. She has no rales. She exhibits no tenderness.  Abdominal: Soft. Bowel sounds are normal. She exhibits no distension. There is no tenderness. There is no rebound and no guarding.  Musculoskeletal: Normal range of motion. She exhibits no edema with mild tenderness.  Neurological: She is alert and oriented to person, place, and time. Coordination normal.  Skin: Skin is warm and dry. No rash noted. She is not diaphoretic. No erythema. No pallor.  Psychiatric: She has a normal mood and affect. Her behavior is normal. Judgment and thought content normal.     EKG: Sinus Rhythm  WITHIN NORMAL LIMITS   ASSESSMENT AND PLAN

## 2016-02-23 ENCOUNTER — Telehealth: Payer: Self-pay | Admitting: *Deleted

## 2016-02-23 ENCOUNTER — Encounter
Admission: RE | Disposition: A | Discharge status: Home / Self Care | Payer: Self-pay | Source: Ambulatory Visit | Attending: General Surgery

## 2016-02-23 ENCOUNTER — Ambulatory Visit: Payer: Commercial Managed Care - HMO | Admitting: Certified Registered Nurse Anesthetist

## 2016-02-23 ENCOUNTER — Ambulatory Visit
Admission: RE | Admit: 2016-02-23 | Discharge: 2016-02-23 | Disposition: A | Discharge status: Home / Self Care | Payer: Commercial Managed Care - HMO | Source: Ambulatory Visit | Attending: General Surgery | Admitting: General Surgery

## 2016-02-23 DIAGNOSIS — Z981 Arthrodesis status: Secondary | ICD-10-CM | POA: Insufficient documentation

## 2016-02-23 DIAGNOSIS — C774 Secondary and unspecified malignant neoplasm of inguinal and lower limb lymph nodes: Secondary | ICD-10-CM | POA: Insufficient documentation

## 2016-02-23 DIAGNOSIS — L409 Psoriasis, unspecified: Secondary | ICD-10-CM | POA: Insufficient documentation

## 2016-02-23 DIAGNOSIS — K219 Gastro-esophageal reflux disease without esophagitis: Secondary | ICD-10-CM | POA: Insufficient documentation

## 2016-02-23 DIAGNOSIS — Z8249 Family history of ischemic heart disease and other diseases of the circulatory system: Secondary | ICD-10-CM | POA: Insufficient documentation

## 2016-02-23 DIAGNOSIS — Z79899 Other long term (current) drug therapy: Secondary | ICD-10-CM | POA: Insufficient documentation

## 2016-02-23 DIAGNOSIS — E785 Hyperlipidemia, unspecified: Secondary | ICD-10-CM | POA: Insufficient documentation

## 2016-02-23 DIAGNOSIS — IMO0002 Reserved for concepts with insufficient information to code with codable children: Secondary | ICD-10-CM

## 2016-02-23 DIAGNOSIS — Z882 Allergy status to sulfonamides status: Secondary | ICD-10-CM | POA: Insufficient documentation

## 2016-02-23 DIAGNOSIS — Z9851 Tubal ligation status: Secondary | ICD-10-CM | POA: Insufficient documentation

## 2016-02-23 DIAGNOSIS — C799 Secondary malignant neoplasm of unspecified site: Secondary | ICD-10-CM

## 2016-02-23 DIAGNOSIS — Z9889 Other specified postprocedural states: Secondary | ICD-10-CM | POA: Insufficient documentation

## 2016-02-23 DIAGNOSIS — Z7951 Long term (current) use of inhaled steroids: Secondary | ICD-10-CM | POA: Insufficient documentation

## 2016-02-23 DIAGNOSIS — Z803 Family history of malignant neoplasm of breast: Secondary | ICD-10-CM | POA: Insufficient documentation

## 2016-02-23 DIAGNOSIS — Z87891 Personal history of nicotine dependence: Secondary | ICD-10-CM | POA: Insufficient documentation

## 2016-02-23 DIAGNOSIS — I10 Essential (primary) hypertension: Secondary | ICD-10-CM | POA: Insufficient documentation

## 2016-02-23 DIAGNOSIS — E2839 Other primary ovarian failure: Secondary | ICD-10-CM | POA: Insufficient documentation

## 2016-02-23 DIAGNOSIS — R1909 Other intra-abdominal and pelvic swelling, mass and lump: Secondary | ICD-10-CM

## 2016-02-23 HISTORY — DX: Adverse effect of unspecified anesthetic, initial encounter: T41.45XA

## 2016-02-23 HISTORY — DX: Other complications of anesthesia, initial encounter: T88.59XA

## 2016-02-23 HISTORY — DX: Other chronic pain: G89.29

## 2016-02-23 HISTORY — PX: MASS EXCISION: SHX2000

## 2016-02-23 HISTORY — DX: Cervicalgia: M54.2

## 2016-02-23 HISTORY — DX: Cardiac arrhythmia, unspecified: I49.9

## 2016-02-23 HISTORY — DX: Unspecified osteoarthritis, unspecified site: M19.90

## 2016-02-23 HISTORY — DX: Nausea with vomiting, unspecified: R11.2

## 2016-02-23 HISTORY — DX: Other specified postprocedural states: Z98.890

## 2016-02-23 HISTORY — DX: Anxiety disorder, unspecified: F41.9

## 2016-02-23 HISTORY — DX: Anemia, unspecified: D64.9

## 2016-02-23 LAB — POCT I-STAT 4, (NA,K, GLUC, HGB,HCT)
GLUCOSE: 97 mg/dL (ref 65–99)
HEMATOCRIT: 37 % (ref 36.0–46.0)
HEMOGLOBIN: 12.6 g/dL (ref 12.0–15.0)
Potassium: 3.3 mmol/L — ABNORMAL LOW (ref 3.5–5.1)
Sodium: 141 mmol/L (ref 135–145)

## 2016-02-23 SURGERY — EXCISION MASS
Anesthesia: General | Site: Groin | Laterality: Left | Wound class: Clean

## 2016-02-23 MED ORDER — ACETAMINOPHEN 10 MG/ML IV SOLN
INTRAVENOUS | Status: DC | PRN
  Administered 2016-02-23: 1000 mg via INTRAVENOUS

## 2016-02-23 MED ORDER — CEFAZOLIN SODIUM-DEXTROSE 2-4 GM/100ML-% IV SOLN
INTRAVENOUS | Status: DC
Start: 2016-02-23 — End: 2016-02-23
  Filled 2016-02-23: qty 100

## 2016-02-23 MED ORDER — PROPOFOL 10 MG/ML IV BOLUS
INTRAVENOUS | Status: DC | PRN
  Administered 2016-02-23: 130 mg via INTRAVENOUS

## 2016-02-23 MED ORDER — BUPIVACAINE HCL (PF) 0.5 % IJ SOLN
INTRAMUSCULAR | Status: AC
  Filled 2016-02-23: qty 30

## 2016-02-23 MED ORDER — FENTANYL CITRATE (PF) 100 MCG/2ML IJ SOLN
INTRAMUSCULAR | Status: AC
  Filled 2016-02-23: qty 2

## 2016-02-23 MED ORDER — OXYCODONE-ACETAMINOPHEN 5-325 MG PO TABS
ORAL_TABLET | ORAL | Status: AC
  Administered 2016-02-23: 1 via ORAL
  Filled 2016-02-23: qty 1

## 2016-02-23 MED ORDER — ONDANSETRON HCL 4 MG/2ML IJ SOLN
INTRAMUSCULAR | Status: DC | PRN
  Administered 2016-02-23: 4 mg via INTRAVENOUS

## 2016-02-23 MED ORDER — ONDANSETRON HCL 4 MG/2ML IJ SOLN: 4.0000 mg | Freq: Once | INTRAMUSCULAR | Status: DC | PRN

## 2016-02-23 MED ORDER — CEFAZOLIN SODIUM-DEXTROSE 2-4 GM/100ML-% IV SOLN
2.0000 g | INTRAVENOUS | Status: AC
  Administered 2016-02-23: 2 g via INTRAVENOUS

## 2016-02-23 MED ORDER — KETOROLAC TROMETHAMINE 30 MG/ML IJ SOLN
INTRAMUSCULAR | Status: DC | PRN
  Administered 2016-02-23: 15 mg via INTRAVENOUS

## 2016-02-23 MED ORDER — MIDAZOLAM HCL 2 MG/2ML IJ SOLN
INTRAMUSCULAR | Status: DC | PRN
  Administered 2016-02-23: 2 mg via INTRAVENOUS

## 2016-02-23 MED ORDER — DEXAMETHASONE SODIUM PHOSPHATE 10 MG/ML IJ SOLN
INTRAMUSCULAR | Status: DC | PRN
  Administered 2016-02-23: 10 mg via INTRAVENOUS

## 2016-02-23 MED ORDER — FENTANYL CITRATE (PF) 100 MCG/2ML IJ SOLN
25.0000 ug | INTRAMUSCULAR | Status: DC | PRN
  Administered 2016-02-23 (×3): 25 ug via INTRAVENOUS

## 2016-02-23 MED ORDER — LACTATED RINGERS IV SOLN
INTRAVENOUS | Status: DC
  Administered 2016-02-23: 08:00:00 via INTRAVENOUS

## 2016-02-23 MED ORDER — FENTANYL CITRATE (PF) 100 MCG/2ML IJ SOLN
INTRAMUSCULAR | Status: DC | PRN
  Administered 2016-02-23: 25 ug via INTRAVENOUS
  Administered 2016-02-23: 50 ug via INTRAVENOUS

## 2016-02-23 MED ORDER — LIDOCAINE HCL (CARDIAC) 20 MG/ML IV SOLN
INTRAVENOUS | Status: DC | PRN
  Administered 2016-02-23: 50 mg via INTRAVENOUS

## 2016-02-23 MED ORDER — ACETAMINOPHEN 10 MG/ML IV SOLN
INTRAVENOUS | Status: AC
  Filled 2016-02-23: qty 100

## 2016-02-23 MED ORDER — OXYCODONE-ACETAMINOPHEN 5-325 MG PO TABS
1.0000 | ORAL_TABLET | Freq: Once | ORAL | Status: AC
  Administered 2016-02-23: 1 via ORAL

## 2016-02-23 MED ORDER — LIDOCAINE HCL (PF) 1 % IJ SOLN
INTRAMUSCULAR | Status: AC
  Filled 2016-02-23: qty 30

## 2016-02-23 MED ORDER — BUPIVACAINE HCL (PF) 0.5 % IJ SOLN
INTRAMUSCULAR | Status: DC | PRN
  Administered 2016-02-23: 18 mL

## 2016-02-23 MED ORDER — CHLORHEXIDINE GLUCONATE CLOTH 2 % EX PADS: 6.0000 | MEDICATED_PAD | Freq: Once | CUTANEOUS | Status: DC

## 2016-02-23 MED ORDER — EPHEDRINE SULFATE 50 MG/ML IJ SOLN
INTRAMUSCULAR | Status: DC | PRN
  Administered 2016-02-23: 5 mg via INTRAVENOUS

## 2016-02-23 SURGICAL SUPPLY — 28 items
BLADE SURG 15 STRL SS SAFETY (BLADE) ×3 IMPLANT
CANISTER SUCT 1200ML W/VALVE (MISCELLANEOUS) ×3 IMPLANT
CHLORAPREP W/TINT 26ML (MISCELLANEOUS) ×3 IMPLANT
DECANTER SPIKE VIAL GLASS SM (MISCELLANEOUS) IMPLANT
DRAIN PENROSE 1/4X12 LTX (DRAIN) ×3 IMPLANT
DRAPE LAPAROTOMY 100X77 ABD (DRAPES) ×3 IMPLANT
ELECT REM PT RETURN 9FT ADLT (ELECTROSURGICAL) ×3
ELECTRODE REM PT RTRN 9FT ADLT (ELECTROSURGICAL) ×2 IMPLANT
GLOVE BIO SURGEON STRL SZ7 (GLOVE) ×18 IMPLANT
GOWN STRL REUS W/ TWL LRG LVL3 (GOWN DISPOSABLE) ×6 IMPLANT
GOWN STRL REUS W/TWL LRG LVL3 (GOWN DISPOSABLE) ×3
KIT RM TURNOVER STRD PROC AR (KITS) ×3 IMPLANT
LABEL OR SOLS (LABEL) ×3 IMPLANT
LIQUID BAND (GAUZE/BANDAGES/DRESSINGS) ×3 IMPLANT
NDL HPO THNWL 1X22GA REG BVL (NEEDLE) ×2 IMPLANT
NEEDLE HYPO 25X1 1.5 SAFETY (NEEDLE) ×3 IMPLANT
NEEDLE SAFETY 22GX1 (NEEDLE) ×1
NS IRRIG 500ML POUR BTL (IV SOLUTION) ×3 IMPLANT
PACK BASIN MINOR ARMC (MISCELLANEOUS) ×3 IMPLANT
SUT PDS 2-0 27IN (SUTURE) ×6 IMPLANT
SUT VIC AB 2-0 SH 27 (SUTURE) ×1
SUT VIC AB 2-0 SH 27XBRD (SUTURE) ×2 IMPLANT
SUT VIC AB 3-0 54X BRD REEL (SUTURE) ×4 IMPLANT
SUT VIC AB 3-0 BRD 54 (SUTURE) ×2
SUT VIC AB 3-0 SH 27 (SUTURE) ×1
SUT VIC AB 3-0 SH 27X BRD (SUTURE) ×2 IMPLANT
SUT VIC AB 4-0 FS2 27 (SUTURE) ×3 IMPLANT
SYR CONTROL 10ML (SYRINGE) ×3 IMPLANT

## 2016-02-23 NOTE — OR Nursing (Signed)
Patient oral airway out

## 2016-02-23 NOTE — Telephone Encounter (Signed)
Patients daughter Bodhi Daluz called, who is not on DPR so I was not able to speak with her, wanted to speak with Dr.Sankar regarding what was found during surgery today 02/19/16. Patient was told by the nurse that Dr.Sankar would call her this afternoon. Patient has not heard back. I page Dr.Sankar at 5:00pm and did not receive a call yet.

## 2016-02-23 NOTE — OR Nursing (Signed)
Patient sleepy oral airway in place

## 2016-02-23 NOTE — OR Nursing (Signed)
No laceration on the tongue only the lip Dr. Ronelle Nigh at bedside

## 2016-02-23 NOTE — Interval H&P Note (Signed)
History and Physical Interval Note:  02/23/2016 8:10 AM  Judy Allen  has presented today for surgery, with the diagnosis of LEFT INGUINAL MASS  The various methods of treatment have been discussed with the patient and family. After consideration of risks, benefits and other options for treatment, the patient has consented to  Procedure(s): HERNIA REPAIR FEMORAL (Left) as a surgical intervention .  The patient's history has been reviewed, patient examined, no change in status, stable for surgery.  I have reviewed the patient's chart and labs.  Questions were answered to the patient's satisfaction.     Anurag Scarfo G

## 2016-02-23 NOTE — H&P (View-Only) (Signed)
Patient ID: Judy Allen, female   DOB: 01/02/1956, 59 y.o.   MRN: [id number]  Chief Complaint  Patient presents with  . Other    left inguinal adenopathy    HPI Judy Allen is a 59 y.o. female here today for a evaluation of a knot in left groin. She states it has been there for 3 months. She states it is painful and getting larger. Bowels move regular, every other day, no bleeding. She did take prednisone about 1 month ago but it did not change the size of the knot. I have reviewed the history of present illness with the patient.   HPI  Past Medical History  Diagnosis Date  . H/O allergic rhinitis   . Neuritis   . Urinary incontinence   . Proteinuria   . GERD (gastroesophageal reflux disease)   . Hyperlipidemia   . Ovarian failure   . Constipation   . Psoriasis   . Heart murmur   . Tachycardia, paroxysmal (HCC)   . Hypertension   . Hemorrhoid     Past Surgical History  Procedure Laterality Date  . Tubal ligation      s/p  . Tonsillectomy  1959    history  . Herniated disc repair    . Anterior fusion lumbar spine    . Cardiac catheterization  2009?     [facility]  . Cardiac catheterization  05/2012    RHC: Mild hypertension 37/12 with a mean pressure of 21 mm mercury    Family History  Problem Relation Age of Onset  . Heart attack Mother   . Breast cancer Maternal Aunt     Social History Social History  Substance Use Topics  . Smoking status: Former Smoker -- 1.00 packs/day for 32 years    Types: Cigarettes    Start date: 08/10/1963    Quit date: 01/15/2006  . Smokeless tobacco: Never Used  . Alcohol Use: No    Allergies  Allergen Reactions  . Sulfa Antibiotics     Current Outpatient Prescriptions  Medication Sig Dispense Refill  . ALPRAZolam (XANAX XR) 1 MG 24 hr tablet Take 1 tablet (1 mg total) by mouth daily. 30 tablet 2  . clobetasol cream (TEMOVATE) 0.05 % Apply topically 2 (two) times daily. 60 g 2  . desonide (DESOWEN) 0.05 %  cream Apply topically 2 (two) times daily. 60 g 2  . divalproex (DEPAKOTE) 250 MG DR tablet Take 1 tablet (250 mg total) by mouth at bedtime. 30 tablet 5  . fluticasone (FLONASE) 50 MCG/ACT nasal spray Place 2 sprays into both nostrils daily. 16 g 6  . gabapentin (NEURONTIN) 300 MG capsule TAKE 1 CAPSULE TWICE A DAY 180 capsule 1  . gemfibrozil (LOPID) 600 MG tablet Take 1 tablet (600 mg total) by mouth daily. 30 tablet 5  . hydrochlorothiazide (MICROZIDE) 12.5 MG capsule Take 1 capsule (12.5 mg total) by mouth daily. 90 capsule 1  . HYDROcodone-acetaminophen (NORCO/VICODIN) 5-325 MG tablet Take 1 tablet by mouth 2 (two) times daily.    . metoprolol (LOPRESSOR) 50 MG tablet Take 1 tablet (50 mg total) by mouth 2 (two) times daily. 180 tablet 1  . omeprazole (PRILOSEC) 40 MG capsule TAKE 1 CAPSULE EVERY DAY 30 capsule 5  . polyethylene glycol powder (GLYCOLAX/MIRALAX) powder Take 17 g by mouth daily. 3350 g 2  . tiZANidine (ZANAFLEX) 4 MG tablet TAKE 1 TABLET (4 MG TOTAL) BY MOUTH 3 (THREE) TIMES DAILY. 90 tablet 2  . venlafaxine XR (  EFFEXOR-XR) 75 MG 24 hr capsule Take 1 capsule (75 mg total) by mouth daily with breakfast. Daily 30 capsule 0  . vitamin E 400 UNIT capsule Take 1 capsule by mouth 2 (two) times daily.     No current facility-administered medications for this visit.    Review of Systems Review of Systems  Constitutional: Negative.   Respiratory: Negative.   Cardiovascular: Negative.   Gastrointestinal: Negative for nausea, vomiting, diarrhea and constipation.    Blood pressure 120/82, pulse 74, resp. rate 12, height 5' 4" (1.626 m), weight 174 lb (78.926 kg).  Physical Exam Physical Exam  Constitutional: She is oriented to person, place, and time. She appears well-developed and well-nourished.  HENT:  Mouth/Throat: Oropharynx is clear and moist.  Eyes: Conjunctivae are normal. No scleral icterus.  Neck: Neck supple.  Cardiovascular: Normal rate and regular rhythm.    Pulmonary/Chest: Effort normal and breath sounds normal.  Abdominal: Soft. Normal appearance and bowel sounds are normal. There is no splenomegaly or hepatomegaly. There is tenderness.  3-4 cm firm non mobile mass to the left of the pubis.   Lymphadenopathy:    She has no cervical adenopathy.    She has no axillary adenopathy.       Left: Inguinal adenopathy present.  Neurological: She is alert and oriented to person, place, and time.  Skin: Skin is warm and dry.  Psychiatric: Her behavior is normal.    Data Reviewed Notes reviewed  Assessment    3-4 cm firm non mobile mass to the left of the pubis. Ultrasound preformed-shows a large hypoechoic mass with some hyperechoic  areas in middle.  Central vascularity noted. Possible this is a femoral hernia.       Plan    Recommend excison of the left groin mass or repair femoral hernia depending on operative finding-patient agrees.    Patient's surgery has been scheduled for 02-23-16 at [facility].  PCP:  Sowles, Krichna This information has been scribed by Jessica Qualls CMA.   Burlene Montecalvo G 02/18/2016, 5:02 PM    

## 2016-02-23 NOTE — Anesthesia Postprocedure Evaluation (Signed)
Anesthesia Post Note  Patient: Judy Allen  Procedure(s) Performed: Procedure(s) (LRB): EXCISION MASS (Left)  Patient location during evaluation: PACU Anesthesia Type: General Level of consciousness: awake and alert Pain management: pain level controlled Vital Signs Assessment: post-procedure vital signs reviewed and stable Respiratory status: spontaneous breathing and respiratory function stable Cardiovascular status: stable Anesthetic complications: no    Last Vitals:  Filed Vitals:   02/23/16 1014 02/23/16 1015  BP:  136/72  Pulse: 58 57  Temp:    Resp: 10 11    Last Pain:  Filed Vitals:   02/23/16 1019  PainSc: Asleep                 Cayson Kalb K

## 2016-02-23 NOTE — Discharge Instructions (Signed)

## 2016-02-23 NOTE — Anesthesia Procedure Notes (Signed)
Procedure Name: LMA Insertion Date/Time: 02/23/2016 8:55 AM Performed by: Johnna Acosta Pre-anesthesia Checklist: Patient identified, Emergency Drugs available, Suction available, Patient being monitored and Timeout performed Patient Re-evaluated:Patient Re-evaluated prior to inductionOxygen Delivery Method: Circle system utilized Preoxygenation: Pre-oxygenation with 100% oxygen Intubation Type: IV induction LMA: LMA inserted LMA Size: 3.5 Number of attempts: 1 Placement Confirmation: positive ETCO2 and breath sounds checked- equal and bilateral Tube secured with: Tape Dental Injury: Teeth and Oropharynx as per pre-operative assessment

## 2016-02-23 NOTE — Op Note (Signed)
Preop diagnosis: Left groin mass  Post op diagnosis: Malignant lymph node left groin  Operation: Expression left groin with excision of a large suspicious lymph node in the femoral canal  Surgeon: Mckinley Jewel   Assistant:     Anesthesia: Gen.  Complications: None  EBL: Less than 20 mL  Drains: None  Description: Patient was put to sleep with an LMA and left groin area was prepped and draped as sterile field. Timeout was performed. The palpable mass about 3-3-1/2 cm was located overlying the femoral canal region. Incision along the skin crease above the groin crease was made after instillation of 20 mL of 0.5% Marcaine. With careful exposure the dissection was carried out until the mass in question was identified. Bleeding was controlled with cautery and ligatures of 3-0 Vicryl. Mass in question had some mild inflammatory type reaction surrounding this and was carefully freed all around. Was noted that it was arising from the region of the femoral canal but did not extend into would like a hernia. The mass was circumferentially freed and removed. It felt somewhat firm to hard and was very vascular. The mass was sent fresh to pathology with a frozen section showing that this was a malignant process likely a carcinoma possible lymphoma. There were no other visible or palpable nodes in this region. After ensuring hemostasis the wound was irrigated and closed in layers. Scarpa's fascia and the subcutaneous tissue were closed in 2 layers of 3-0 Vicryl. Skin closed with subcuticular 4-0 Vicryl covered with liqui  Ban. Patient subsequently was returned to PACU in stable condition.

## 2016-02-23 NOTE — OR Nursing (Signed)
Small laceration noted on tongue and lower lip right side

## 2016-02-23 NOTE — Anesthesia Preprocedure Evaluation (Signed)
Anesthesia Evaluation  Patient identified by MRN, date of birth, ID band Patient awake    Reviewed: Allergy & Precautions, NPO status , Patient's Chart, lab work & pertinent test results  History of Anesthesia Complications (+) PONV and history of anesthetic complications  Airway Mallampati: III       Dental   Pulmonary sleep apnea , former smoker,           Cardiovascular hypertension, Pt. on medications + dysrhythmias Supra Ventricular Tachycardia + Valvular Problems/Murmurs (? VSD)      Neuro/Psych Anxiety Depression    GI/Hepatic Neg liver ROS, GERD  Medicated and Controlled,  Endo/Other  negative endocrine ROS  Renal/GU negative Renal ROS     Musculoskeletal   Abdominal   Peds  Hematology  (+) anemia ,   Anesthesia Other Findings   Reproductive/Obstetrics                             Anesthesia Physical Anesthesia Plan  ASA: II  Anesthesia Plan: General   Post-op Pain Management:    Induction:   Airway Management Planned: LMA  Additional Equipment:   Intra-op Plan:   Post-operative Plan:   Informed Consent: I have reviewed the patients History and Physical, chart, labs and discussed the procedure including the risks, benefits and alternatives for the proposed anesthesia with the patient or authorized representative who has indicated his/her understanding and acceptance.     Plan Discussed with:   Anesthesia Plan Comments:         Anesthesia Quick Evaluation

## 2016-02-23 NOTE — OR Nursing (Signed)
Bruised area noted on lower lip. Patient denies having it prior to arrival to hospital this morning. Does not bother her.

## 2016-02-23 NOTE — Transfer of Care (Signed)
Immediate Anesthesia Transfer of Care Note  Patient: Judy Allen  Procedure(s) Performed: Procedure(s): EXCISION MASS (Left)  Patient Location: PACU  Anesthesia Type:General  Level of Consciousness: sedated  Airway & Oxygen Therapy: Patient Spontanous Breathing and Patient connected to face mask oxygen  Post-op Assessment: Report given to RN and Post -op Vital signs reviewed and stable  Post vital signs: Reviewed and stable  Last Vitals:  Filed Vitals:   02/23/16 0727  BP: 115/82  Pulse: 55  Temp: 36.4 C  Resp: 16    Last Pain:  Filed Vitals:   02/23/16 0729  PainSc: 2          Complications: No apparent anesthesia complications

## 2016-02-24 ENCOUNTER — Other Ambulatory Visit: Payer: Self-pay | Admitting: Family Medicine

## 2016-02-24 LAB — SURGICAL PATHOLOGY

## 2016-02-24 NOTE — Telephone Encounter (Signed)
Patient requesting refill. 

## 2016-02-25 DIAGNOSIS — C799 Secondary malignant neoplasm of unspecified site: Secondary | ICD-10-CM

## 2016-02-25 DIAGNOSIS — IMO0002 Reserved for concepts with insufficient information to code with codable children: Secondary | ICD-10-CM

## 2016-02-26 ENCOUNTER — Encounter: Payer: Self-pay | Admitting: General Surgery

## 2016-02-26 ENCOUNTER — Ambulatory Visit (INDEPENDENT_AMBULATORY_CARE_PROVIDER_SITE_OTHER): Payer: 59 | Admitting: General Surgery

## 2016-02-26 VITALS — BP 128/74 | HR 72 | Resp 12 | Ht 66.0 in | Wt 176.0 lb

## 2016-02-26 DIAGNOSIS — R1909 Other intra-abdominal and pelvic swelling, mass and lump: Secondary | ICD-10-CM

## 2016-02-26 DIAGNOSIS — K629 Disease of anus and rectum, unspecified: Secondary | ICD-10-CM | POA: Diagnosis not present

## 2016-02-26 DIAGNOSIS — IMO0002 Reserved for concepts with insufficient information to code with codable children: Secondary | ICD-10-CM

## 2016-02-26 DIAGNOSIS — C801 Malignant (primary) neoplasm, unspecified: Secondary | ICD-10-CM | POA: Diagnosis not present

## 2016-02-26 DIAGNOSIS — C799 Secondary malignant neoplasm of unspecified site: Secondary | ICD-10-CM

## 2016-02-26 NOTE — Patient Instructions (Signed)
The patient is scheduled for a PET scan at [facility] on 03/04/16 at 8:30 am. She will report there at 8:00 am. She will have nothing to eat or drink after midnight. She is scheduled for surgery at [facility] on 03/10/16. She will pre admit by phone. The patient is aware of dates, time, and instructions. [facility] will be in contact with the patient to set her up with medical Oncology to see Dr Rogue Bussing, patient provided with their contact information.

## 2016-02-26 NOTE — Progress Notes (Signed)
Patient ID: Judy Allen, female   DOB: 1956/02/09, 60 y.o.   MRN: [id number]  Chief Complaint  Patient presents with  . Routine Post Op    left groin    HPI Judy Allen is a 60 y.o. female here today for ger post op left inguinal  mass excision done on 02/23/16. She is accompanied by her husband and daughter. Pathology report showed that this mass is an inguinal lymph node positive for metastatic squamous cell carcinoma. She states that she is tender in the area of the operation. Patient states she has had a lesion that is coming out of her anal area for approximately one year. She has not mentioned this to me or her primary care physician until now. She believes this lesion to be a hemorrhoid. She states that the lesion bleeds "sometimes pours blood, sometimes spots blood" and occasionally has an odor.  I have reviewed the history of present illness with the patient.  HPI  Past Medical History  Diagnosis Date  . H/O allergic rhinitis   . Neuritis   . Urinary incontinence   . Proteinuria   . GERD (gastroesophageal reflux disease)   . Hyperlipidemia   . Ovarian failure   . Constipation   . Psoriasis   . Heart murmur   . Tachycardia, paroxysmal ([place])   . Hypertension   . Hemorrhoid   . Anxiety   . Arthritis   . Anemia     H/O  . Complication of anesthesia   . PONV (postoperative nausea and vomiting)   . Dysrhythmia     TACHYCARDIA-WELL CONTROLLED ON METOPROLO  . Neck pain, chronic     Past Surgical History  Procedure Laterality Date  . Tubal ligation      s/p  . Tonsillectomy  1959    history  . Herniated disc repair    . Anterior fusion lumbar spine    . Cardiac catheterization  2009?     [facility]  . Cardiac catheterization  05/2012    RHC: Mild hypertension 37/12 with a mean pressure of 21 mm mercury  . Neck surgery    . Mass excision Left 02/23/2016    Procedure: EXCISION MASS;  Surgeon: Christene Lye, MD;  Location: [facility] ORS;  Service:  General;  Laterality: Left;  . Colonoscopy  2008    Family History  Problem Relation Age of Onset  . Heart attack Mother   . Breast cancer Maternal Aunt     Social History Social History  Substance Use Topics  . Smoking status: Former Smoker -- 1.00 packs/day for 32 years    Types: Cigarettes    Start date: 08/10/1963    Quit date: 01/15/2006  . Smokeless tobacco: Never Used  . Alcohol Use: No    Allergies  Allergen Reactions  . Sulfa Antibiotics Rash    Current Outpatient Prescriptions  Medication Sig Dispense Refill  . ALPRAZolam (XANAX XR) 1 MG 24 hr tablet Take 1 tablet (1 mg total) by mouth daily. (Patient taking differently: Take 1 mg by mouth every morning. ) 30 tablet 2  . clobetasol cream (TEMOVATE) 0.05 % Apply topically 2 (two) times daily. 60 g 2  . desonide (DESOWEN) 0.05 % cream Apply topically 2 (two) times daily. (Patient taking differently: Apply topically 2 (two) times daily. FACE AND PRIVATE AREA) 60 g 2  . divalproex (DEPAKOTE) 250 MG DR tablet Take 1 tablet (250 mg total) by mouth at bedtime. 30 tablet 5  . gabapentin (  NEURONTIN) 300 MG capsule TAKE 1 CAPSULE TWICE A DAY 180 capsule 1  . gemfibrozil (LOPID) 600 MG tablet Take 1 tablet (600 mg total) by mouth daily. (Patient taking differently: Take 600 mg by mouth daily with lunch. ) 30 tablet 5  . hydrochlorothiazide (MICROZIDE) 12.5 MG capsule Take 1 capsule (12.5 mg total) by mouth daily. 90 capsule 1  . HYDROcodone-acetaminophen (NORCO/VICODIN) 5-325 MG tablet Take 0.5 tablets by mouth 2 (two) times daily. DURING THE DAY WHILE AT WORK    . HYDROcodone-acetaminophen (NORCO/VICODIN) 5-325 MG tablet Take 1 tablet by mouth at bedtime.    . metoprolol (LOPRESSOR) 50 MG tablet TAKE 1 TABLET (50 MG TOTAL) BY MOUTH 2 (TWO) TIMES DAILY. 180 tablet 1  . omeprazole (PRILOSEC) 40 MG capsule TAKE 1 CAPSULE EVERY DAY (Patient taking differently: TAKE 1 CAPSULE EVERY DAY-AM) 30 capsule 5  . tiZANidine (ZANAFLEX) 4 MG  tablet TAKE 1 TABLET (4 MG TOTAL) BY MOUTH 3 (THREE) TIMES DAILY. (Patient taking differently: TAKE 0.5 TABLET BY MOUTH BID (WITH HYDROCODONE) AND 1 TABLET AT BEDTIME) 90 tablet 2  . venlafaxine XR (EFFEXOR-XR) 75 MG 24 hr capsule Take 1 capsule (75 mg total) by mouth daily with breakfast. Daily 30 capsule 0  . vitamin E 400 UNIT capsule Take 2 capsules by mouth every morning.      No current facility-administered medications for this visit.    Review of Systems Review of Systems  Constitutional: Negative.   Respiratory: Negative.     Blood pressure 128/74, pulse 72, resp. rate 12, height 5\' 6"  (1.676 m), weight 176 lb (79.833 kg).  Physical Exam Physical Exam  Constitutional: She appears well-developed and well-nourished.  Abdominal: Soft. She exhibits no mass. There is tenderness in the right lower quadrant. Hernia confirmed negative in the right inguinal area and confirmed negative in the left inguinal area.    Genitourinary:     Neurological: She is alert.  Skin: Skin is warm and dry.    Data Reviewed Pathology report  Assessment    Anal lesion highly suspicious for primary neoplasm. Left groin mass revealed metastatic squamous cell carcinoma likely from anal primary.    Plan   Discussed findings in detail with patient. Biopsy of anal lesion to confirm diagnosis of primary carcinoma was recommended and completed with her consent.  Patient was advised that she likely requires a PET scan for staging followed by chemotherapy and radiation.  Patient is also advised on placement of a port for chemotherapy.  She is agreeable to all of this.   Procedure note: Patient was placed on the left lateral position. The anal area was infiltrated with 1% xylocaine with epi 77mL at the base of the hemorrhoidal tissue. The area was prepped with betadine and several pieces of the central mass were removed for pathology. Bleeding was controlled with thermocautery. Area was dressed with  neosporin and gauze. Patient advised to keep neosporin or a similar antibiotic ointment on the area. No immediate problems from the procedure. Patient tolerated it well. Patient is to be scheduled for PET scan, oncology consult and port placement.    The patient is scheduled for a PET scan at [facility] on 03/04/16 at 8:30 am. She will report there at 8:00 am. She will have nothing to eat or drink after midnight. She is scheduled for surgery at [facility] on 03/10/16. She will pre admit by phone. The patient is aware of dates, time, and instructions. The [place] at [facility] will be in contact  with the patient to set her up with medical Oncology to see Dr Rogue Bussing, patient provided with their contact information.  PCP:  [place] This information has been scribed by Gaspar Cola CMA.   Iraida Cragin G 02/26/2016, 10:49 AM

## 2016-02-26 NOTE — Addendum Note (Signed)
Addended by: Christene Lye on: 02/26/2016 01:59 PM   Modules accepted: Orders

## 2016-02-26 NOTE — Progress Notes (Deleted)
This is a 60 year old female here today for ger post op left inguinal  mass excision done on 02/23/16.      PCP:  Sowles This information has been scribed by Gaspar Cola CMA.

## 2016-02-27 ENCOUNTER — Telehealth: Payer: Self-pay

## 2016-02-27 NOTE — Telephone Encounter (Signed)
Patient called and wants to be referred to [facility] for her Oncology care. She still wants her PET scan done at [facility] and her port placement with Dr Jamal Collin at [facility] on 03/10/16.

## 2016-02-27 NOTE — Telephone Encounter (Signed)
Please make a referral to [facility] oncology. Let me know which MD she is going to see so I can talk to that MD

## 2016-03-01 LAB — PATHOLOGY

## 2016-03-01 NOTE — Telephone Encounter (Signed)
Spoke with patient about her Oncology referral to [facility]. She is scheduled to see Dr Drucilla Chalet at [place] on 03/10/16 at 3:00 pm. She is to arrive there by 2:30 pm and she will need to bring a copy of her PET scan on a disk. The patient is aware of date, time, and instructions. He port placement scheduled for 03/10/16 will be canceled for now.

## 2016-03-02 ENCOUNTER — Telehealth: Payer: Self-pay

## 2016-03-02 ENCOUNTER — Ambulatory Visit: Payer: 59 | Admitting: Internal Medicine

## 2016-03-02 NOTE — Telephone Encounter (Signed)
Call to patient to reschedule her port placement as she will be seeing Oncology at [facility] on that day. The patient is now scheduled for surgery at [facility] on 03/12/16. She is aware of date and instructions.

## 2016-03-03 ENCOUNTER — Other Ambulatory Visit: Payer: Self-pay | Admitting: General Surgery

## 2016-03-03 ENCOUNTER — Encounter
Admission: RE | Admit: 2016-03-03 | Discharge: 2016-03-03 | Disposition: A | Discharge status: Home / Self Care | Payer: 59 | Source: Ambulatory Visit | Attending: General Surgery | Admitting: General Surgery

## 2016-03-03 DIAGNOSIS — C210 Malignant neoplasm of anus, unspecified: Secondary | ICD-10-CM | POA: Diagnosis not present

## 2016-03-03 DIAGNOSIS — C799 Secondary malignant neoplasm of unspecified site: Secondary | ICD-10-CM | POA: Diagnosis present

## 2016-03-03 DIAGNOSIS — R1909 Other intra-abdominal and pelvic swelling, mass and lump: Secondary | ICD-10-CM | POA: Diagnosis present

## 2016-03-03 DIAGNOSIS — K629 Disease of anus and rectum, unspecified: Secondary | ICD-10-CM | POA: Diagnosis present

## 2016-03-03 LAB — POTASSIUM: POTASSIUM: 4.1 mmol/L (ref 3.5–5.1)

## 2016-03-03 NOTE — Patient Instructions (Signed)
  Your procedure is scheduled on: 03-12-16 (FRIDAY) Report to Same Day Surgery 2nd floor medical mall To find out your arrival time please call [phone] between [place] on 03-11-16 (THURSDAY)  Remember: Instructions that are not followed completely may result in serious medical risk, up to and including death, or upon the discretion of your surgeon and anesthesiologist your surgery may need to be rescheduled.    _x___ 1. Do not eat food or drink liquids after midnight. No gum chewing or hard candies.     __x__ 2. No Alcohol for 24 hours before or after surgery.   __x__3. No Smoking for 24 prior to surgery.   ____  4. Bring all medications with you on the day of surgery if instructed.    __x__ 5. Notify your doctor if there is any change in your medical condition     (cold, fever, infections).     Do not wear jewelry, make-up, hairpins, clips or nail polish.  Do not wear lotions, powders, or perfumes. You may wear deodorant.  Do not shave 48 hours prior to surgery. Men may shave face and neck.  Do not bring valuables to the hospital.    [facility] is not responsible for any belongings or valuables.               Contacts, dentures or bridgework may not be worn into surgery.  Leave your suitcase in the car. After surgery it may be brought to your room.  For patients admitted to the hospital, discharge time is determined by your treatment team.   Patients discharged the day of surgery will not be allowed to drive home.    Please read over the following fact sheets that you were given:   [facility] Preparing for Surgery and or MRSA Information   _x___ Take these medicines the morning of surgery with A SIP OF WATER:    1. XANAX   2. GABAPENTIN  3. GEMFIBROZIL  4. EFFEXOR  5. METOPROLOL  6. PRILOSEC  7. TAKE AN EXTRA PRILOSEC Thursday NIGHT BEFORE BED  ____ Fleet Enema (as directed)   _x___ Use CHG Soap or sage wipes as directed on instruction sheet   ____ Use inhalers  on the day of surgery and bring to hospital day of surgery  ____ Stop metformin 2 days prior to surgery    ____ Take 1/2 of usual insulin dose the night before surgery and none on the morning of surgery.   ____ Stop aspirin or coumadin, or plavix  ___ Stop Anti-inflammatories such as Advil, Aleve, Ibuprofen, Motrin, Naproxen,          Naprosyn, Goodies powders or aspirin products. Ok to take Tylenol.   _X___ Stop supplements until after surgery-STOP VITAMIN E NOW  ____ Bring C-Pap to the hospital.

## 2016-03-04 ENCOUNTER — Ambulatory Visit
Admission: RE | Admit: 2016-03-04 | Discharge: 2016-03-04 | Disposition: A | Discharge status: Home / Self Care | Payer: 59 | Source: Ambulatory Visit | Attending: General Surgery | Admitting: General Surgery

## 2016-03-04 DIAGNOSIS — C210 Malignant neoplasm of anus, unspecified: Secondary | ICD-10-CM | POA: Insufficient documentation

## 2016-03-04 DIAGNOSIS — IMO0002 Reserved for concepts with insufficient information to code with codable children: Secondary | ICD-10-CM

## 2016-03-04 DIAGNOSIS — R1909 Other intra-abdominal and pelvic swelling, mass and lump: Secondary | ICD-10-CM | POA: Insufficient documentation

## 2016-03-04 DIAGNOSIS — C799 Secondary malignant neoplasm of unspecified site: Secondary | ICD-10-CM | POA: Insufficient documentation

## 2016-03-04 DIAGNOSIS — K629 Disease of anus and rectum, unspecified: Secondary | ICD-10-CM

## 2016-03-04 LAB — GLUCOSE, CAPILLARY: Glucose-Capillary: 101 mg/dL — ABNORMAL HIGH (ref 65–99)

## 2016-03-04 MED ORDER — FLUDEOXYGLUCOSE F - 18 (FDG) INJECTION
12.0000 | Freq: Once | INTRAVENOUS | Status: AC
  Administered 2016-03-04: 11.962 via INTRAVENOUS

## 2016-03-09 ENCOUNTER — Ambulatory Visit: Payer: 59 | Admitting: Internal Medicine

## 2016-03-09 DIAGNOSIS — C210 Malignant neoplasm of anus, unspecified: Secondary | ICD-10-CM | POA: Insufficient documentation

## 2016-03-10 DIAGNOSIS — C210 Malignant neoplasm of anus, unspecified: Secondary | ICD-10-CM

## 2016-03-10 HISTORY — DX: Malignant neoplasm of anus, unspecified: C21.0

## 2016-03-12 ENCOUNTER — Ambulatory Visit: Payer: Commercial Managed Care - HMO

## 2016-03-12 ENCOUNTER — Ambulatory Visit: Payer: Commercial Managed Care - HMO | Admitting: Anesthesiology

## 2016-03-12 ENCOUNTER — Ambulatory Visit
Admission: RE | Admit: 2016-03-12 | Discharge: 2016-03-12 | Disposition: A | Discharge status: Home / Self Care | Payer: Commercial Managed Care - HMO | Source: Ambulatory Visit | Attending: General Surgery | Admitting: General Surgery

## 2016-03-12 ENCOUNTER — Encounter
Admission: RE | Disposition: A | Discharge status: Home / Self Care | Payer: Self-pay | Source: Ambulatory Visit | Attending: General Surgery

## 2016-03-12 ENCOUNTER — Encounter: Payer: Self-pay | Admitting: *Deleted

## 2016-03-12 DIAGNOSIS — Z452 Encounter for adjustment and management of vascular access device: Secondary | ICD-10-CM | POA: Diagnosis present

## 2016-03-12 DIAGNOSIS — Z882 Allergy status to sulfonamides status: Secondary | ICD-10-CM | POA: Diagnosis not present

## 2016-03-12 DIAGNOSIS — Z8249 Family history of ischemic heart disease and other diseases of the circulatory system: Secondary | ICD-10-CM | POA: Insufficient documentation

## 2016-03-12 DIAGNOSIS — Z9889 Other specified postprocedural states: Secondary | ICD-10-CM | POA: Insufficient documentation

## 2016-03-12 DIAGNOSIS — E785 Hyperlipidemia, unspecified: Secondary | ICD-10-CM | POA: Insufficient documentation

## 2016-03-12 DIAGNOSIS — Z79891 Long term (current) use of opiate analgesic: Secondary | ICD-10-CM | POA: Insufficient documentation

## 2016-03-12 DIAGNOSIS — K219 Gastro-esophageal reflux disease without esophagitis: Secondary | ICD-10-CM | POA: Insufficient documentation

## 2016-03-12 DIAGNOSIS — Z79899 Other long term (current) drug therapy: Secondary | ICD-10-CM | POA: Insufficient documentation

## 2016-03-12 DIAGNOSIS — C785 Secondary malignant neoplasm of large intestine and rectum: Secondary | ICD-10-CM | POA: Insufficient documentation

## 2016-03-12 DIAGNOSIS — Z7951 Long term (current) use of inhaled steroids: Secondary | ICD-10-CM | POA: Diagnosis not present

## 2016-03-12 DIAGNOSIS — Z87891 Personal history of nicotine dependence: Secondary | ICD-10-CM | POA: Insufficient documentation

## 2016-03-12 DIAGNOSIS — F419 Anxiety disorder, unspecified: Secondary | ICD-10-CM | POA: Diagnosis not present

## 2016-03-12 DIAGNOSIS — C774 Secondary and unspecified malignant neoplasm of inguinal and lower limb lymph nodes: Secondary | ICD-10-CM | POA: Diagnosis not present

## 2016-03-12 DIAGNOSIS — M542 Cervicalgia: Secondary | ICD-10-CM | POA: Diagnosis not present

## 2016-03-12 DIAGNOSIS — G8929 Other chronic pain: Secondary | ICD-10-CM | POA: Insufficient documentation

## 2016-03-12 DIAGNOSIS — C210 Malignant neoplasm of anus, unspecified: Secondary | ICD-10-CM

## 2016-03-12 DIAGNOSIS — C799 Secondary malignant neoplasm of unspecified site: Secondary | ICD-10-CM

## 2016-03-12 DIAGNOSIS — Z981 Arthrodesis status: Secondary | ICD-10-CM | POA: Insufficient documentation

## 2016-03-12 DIAGNOSIS — I10 Essential (primary) hypertension: Secondary | ICD-10-CM | POA: Insufficient documentation

## 2016-03-12 DIAGNOSIS — L409 Psoriasis, unspecified: Secondary | ICD-10-CM | POA: Insufficient documentation

## 2016-03-12 DIAGNOSIS — Z803 Family history of malignant neoplasm of breast: Secondary | ICD-10-CM | POA: Insufficient documentation

## 2016-03-12 DIAGNOSIS — I479 Paroxysmal tachycardia, unspecified: Secondary | ICD-10-CM | POA: Insufficient documentation

## 2016-03-12 DIAGNOSIS — E2839 Other primary ovarian failure: Secondary | ICD-10-CM | POA: Insufficient documentation

## 2016-03-12 DIAGNOSIS — IMO0002 Reserved for concepts with insufficient information to code with codable children: Secondary | ICD-10-CM

## 2016-03-12 DIAGNOSIS — Z95828 Presence of other vascular implants and grafts: Secondary | ICD-10-CM

## 2016-03-12 DIAGNOSIS — M1990 Unspecified osteoarthritis, unspecified site: Secondary | ICD-10-CM | POA: Insufficient documentation

## 2016-03-12 HISTORY — PX: PORTACATH PLACEMENT: SHX2246

## 2016-03-12 SURGERY — INSERTION, TUNNELED CENTRAL VENOUS DEVICE, WITH PORT
Anesthesia: Monitor Anesthesia Care | Site: Chest | Laterality: Left | Wound class: Clean

## 2016-03-12 MED ORDER — LIDOCAINE HCL 1 % IJ SOLN
INTRAMUSCULAR | Status: DC | PRN
  Administered 2016-03-12: 17 mL

## 2016-03-12 MED ORDER — ONDANSETRON HCL 4 MG/2ML IJ SOLN: 4.0000 mg | Freq: Once | INTRAMUSCULAR | Status: DC | PRN

## 2016-03-12 MED ORDER — FENTANYL CITRATE (PF) 100 MCG/2ML IJ SOLN
25.0000 ug | INTRAMUSCULAR | Status: DC | PRN
  Administered 2016-03-12 (×3): 25 ug via INTRAVENOUS

## 2016-03-12 MED ORDER — CEFAZOLIN SODIUM-DEXTROSE 2-4 GM/100ML-% IV SOLN
INTRAVENOUS | Status: AC
  Administered 2016-03-12: 2 g via INTRAVENOUS
  Filled 2016-03-12: qty 100

## 2016-03-12 MED ORDER — BUPIVACAINE HCL (PF) 0.5 % IJ SOLN
INTRAMUSCULAR | Status: AC
  Filled 2016-03-12: qty 30

## 2016-03-12 MED ORDER — LACTATED RINGERS IV SOLN
INTRAVENOUS | Status: DC
  Administered 2016-03-12: 07:00:00 via INTRAVENOUS

## 2016-03-12 MED ORDER — CHLORHEXIDINE GLUCONATE CLOTH 2 % EX PADS: 6.0000 | MEDICATED_PAD | Freq: Once | CUTANEOUS | Status: DC

## 2016-03-12 MED ORDER — CEFAZOLIN SODIUM-DEXTROSE 2-4 GM/100ML-% IV SOLN
2.0000 g | INTRAVENOUS | Status: AC
  Administered 2016-03-12: 2 g via INTRAVENOUS

## 2016-03-12 MED ORDER — LIDOCAINE HCL (PF) 1 % IJ SOLN
INTRAMUSCULAR | Status: AC
  Filled 2016-03-12: qty 30

## 2016-03-12 MED ORDER — PROPOFOL 500 MG/50ML IV EMUL
INTRAVENOUS | Status: DC | PRN
  Administered 2016-03-12: 75 ug/kg/min via INTRAVENOUS

## 2016-03-12 MED ORDER — HEPARIN SODIUM (PORCINE) 5000 UNIT/ML IJ SOLN
INTRAMUSCULAR | Status: AC
  Filled 2016-03-12: qty 1

## 2016-03-12 MED ORDER — CEFAZOLIN SODIUM-DEXTROSE 2-4 GM/100ML-% IV SOLN: 2.0000 g | INTRAVENOUS | Status: DC

## 2016-03-12 MED ORDER — PROPOFOL 10 MG/ML IV BOLUS
INTRAVENOUS | Status: DC | PRN
  Administered 2016-03-12: 30 mg via INTRAVENOUS
  Administered 2016-03-12: 60 mg via INTRAVENOUS

## 2016-03-12 MED ORDER — FENTANYL CITRATE (PF) 100 MCG/2ML IJ SOLN
INTRAMUSCULAR | Status: AC
  Administered 2016-03-12: 25 ug via INTRAVENOUS
  Filled 2016-03-12: qty 2

## 2016-03-12 MED ORDER — FENTANYL CITRATE (PF) 100 MCG/2ML IJ SOLN
INTRAMUSCULAR | Status: DC | PRN
  Administered 2016-03-12: 50 ug via INTRAVENOUS
  Administered 2016-03-12: 25 ug via INTRAVENOUS

## 2016-03-12 MED ORDER — LIDOCAINE HCL (CARDIAC) 20 MG/ML IV SOLN
INTRAVENOUS | Status: DC | PRN
  Administered 2016-03-12: 60 mg via INTRAVENOUS

## 2016-03-12 MED ORDER — ONDANSETRON HCL 4 MG/2ML IJ SOLN
INTRAMUSCULAR | Status: DC | PRN
  Administered 2016-03-12: 4 mg via INTRAVENOUS

## 2016-03-12 MED ORDER — TRAMADOL HCL 50 MG PO TABS: 50.0000 mg | ORAL_TABLET | Freq: Four times a day (QID) | ORAL | 0 refills | Status: DC | PRN

## 2016-03-12 SURGICAL SUPPLY — 28 items
BAG DECANTER FOR FLEXI CONT (MISCELLANEOUS) ×2 IMPLANT
BLADE SURG 15 STRL SS SAFETY (BLADE) ×2 IMPLANT
CANISTER SUCT 1200ML W/VALVE (MISCELLANEOUS) ×2 IMPLANT
CHLORAPREP W/TINT 26ML (MISCELLANEOUS) ×2 IMPLANT
COVER LIGHT HANDLE STERIS (MISCELLANEOUS) ×4 IMPLANT
DECANTER SPIKE VIAL GLASS SM (MISCELLANEOUS) IMPLANT
DRAPE C-ARM XRAY 36X54 (DRAPES) ×2 IMPLANT
ELECT REM PT RETURN 9FT ADLT (ELECTROSURGICAL) ×2
ELECTRODE REM PT RTRN 9FT ADLT (ELECTROSURGICAL) ×1 IMPLANT
GLOVE BIO SURGEON STRL SZ7 (GLOVE) ×8 IMPLANT
GOWN STRL REUS W/ TWL LRG LVL3 (GOWN DISPOSABLE) ×3 IMPLANT
GOWN STRL REUS W/TWL LRG LVL3 (GOWN DISPOSABLE) ×3
IV NS 500ML (IV SOLUTION) ×1
IV NS 500ML BAXH (IV SOLUTION) ×1 IMPLANT
KIT PORT POWER 8FR ISP CVUE (Catheter) ×2 IMPLANT
KIT RM TURNOVER STRD PROC AR (KITS) ×2 IMPLANT
LABEL OR SOLS (LABEL) ×2 IMPLANT
LIQUID BAND (GAUZE/BANDAGES/DRESSINGS) ×2 IMPLANT
NEEDLE FILTER BLUNT 18X 1/2SAF (NEEDLE) ×1
NEEDLE FILTER BLUNT 18X1 1/2 (NEEDLE) ×1 IMPLANT
NEEDLE HYPO 25GX1X1/2 BEV (NEEDLE) ×2 IMPLANT
NS IRRIG 500ML POUR BTL (IV SOLUTION) ×2 IMPLANT
PACK PORT-A-CATH (MISCELLANEOUS) ×2 IMPLANT
SUT PROLENE 2 0 SH DA (SUTURE) ×2 IMPLANT
SUT VIC AB 3-0 SH 27 (SUTURE) ×1
SUT VIC AB 3-0 SH 27X BRD (SUTURE) ×1 IMPLANT
SUT VIC AB 4-0 FS2 27 (SUTURE) ×2 IMPLANT
SYR 3ML LL SCALE MARK (SYRINGE) ×2 IMPLANT

## 2016-03-12 NOTE — H&P (View-Only) (Signed)
Patient ID: Judy Allen, female   DOB: May 02, 1956, 60 y.o.   MRN: [id number]  Chief Complaint  Patient presents with  . Routine Post Op    left groin    HPI Judy Allen is a 60 y.o. female here today for ger post op left inguinal  mass excision done on 02/23/16. She is accompanied by her husband and daughter. Pathology report showed that this mass is an inguinal lymph node positive for metastatic squamous cell carcinoma. She states that she is tender in the area of the operation. Patient states she has had a lesion that is coming out of her anal area for approximately one year. She has not mentioned this to me or her primary care physician until now. She believes this lesion to be a hemorrhoid. She states that the lesion bleeds "sometimes pours blood, sometimes spots blood" and occasionally has an odor.  I have reviewed the history of present illness with the patient.  HPI  Past Medical History  Diagnosis Date  . H/O allergic rhinitis   . Neuritis   . Urinary incontinence   . Proteinuria   . GERD (gastroesophageal reflux disease)   . Hyperlipidemia   . Ovarian failure   . Constipation   . Psoriasis   . Heart murmur   . Tachycardia, paroxysmal ([place])   . Hypertension   . Hemorrhoid   . Anxiety   . Arthritis   . Anemia     H/O  . Complication of anesthesia   . PONV (postoperative nausea and vomiting)   . Dysrhythmia     TACHYCARDIA-WELL CONTROLLED ON METOPROLO  . Neck pain, chronic     Past Surgical History  Procedure Laterality Date  . Tubal ligation      s/p  . Tonsillectomy  1959    history  . Herniated disc repair    . Anterior fusion lumbar spine    . Cardiac catheterization  2009?     [facility]  . Cardiac catheterization  05/2012    RHC: Mild hypertension 37/12 with a mean pressure of 21 mm mercury  . Neck surgery    . Mass excision Left 02/23/2016    Procedure: EXCISION MASS;  Surgeon: Christene Lye, MD;  Location: [facility] ORS;  Service:  General;  Laterality: Left;  . Colonoscopy  2008    Family History  Problem Relation Age of Onset  . Heart attack Mother   . Breast cancer Maternal Aunt     Social History Social History  Substance Use Topics  . Smoking status: Former Smoker -- 1.00 packs/day for 32 years    Types: Cigarettes    Start date: 08/10/1963    Quit date: 01/15/2006  . Smokeless tobacco: Never Used  . Alcohol Use: No    Allergies  Allergen Reactions  . Sulfa Antibiotics Rash    Current Outpatient Prescriptions  Medication Sig Dispense Refill  . ALPRAZolam (XANAX XR) 1 MG 24 hr tablet Take 1 tablet (1 mg total) by mouth daily. (Patient taking differently: Take 1 mg by mouth every morning. ) 30 tablet 2  . clobetasol cream (TEMOVATE) 0.05 % Apply topically 2 (two) times daily. 60 g 2  . desonide (DESOWEN) 0.05 % cream Apply topically 2 (two) times daily. (Patient taking differently: Apply topically 2 (two) times daily. FACE AND PRIVATE AREA) 60 g 2  . divalproex (DEPAKOTE) 250 MG DR tablet Take 1 tablet (250 mg total) by mouth at bedtime. 30 tablet 5  . gabapentin (  NEURONTIN) 300 MG capsule TAKE 1 CAPSULE TWICE A DAY 180 capsule 1  . gemfibrozil (LOPID) 600 MG tablet Take 1 tablet (600 mg total) by mouth daily. (Patient taking differently: Take 600 mg by mouth daily with lunch. ) 30 tablet 5  . hydrochlorothiazide (MICROZIDE) 12.5 MG capsule Take 1 capsule (12.5 mg total) by mouth daily. 90 capsule 1  . HYDROcodone-acetaminophen (NORCO/VICODIN) 5-325 MG tablet Take 0.5 tablets by mouth 2 (two) times daily. DURING THE DAY WHILE AT WORK    . HYDROcodone-acetaminophen (NORCO/VICODIN) 5-325 MG tablet Take 1 tablet by mouth at bedtime.    . metoprolol (LOPRESSOR) 50 MG tablet TAKE 1 TABLET (50 MG TOTAL) BY MOUTH 2 (TWO) TIMES DAILY. 180 tablet 1  . omeprazole (PRILOSEC) 40 MG capsule TAKE 1 CAPSULE EVERY DAY (Patient taking differently: TAKE 1 CAPSULE EVERY DAY-AM) 30 capsule 5  . tiZANidine (ZANAFLEX) 4 MG  tablet TAKE 1 TABLET (4 MG TOTAL) BY MOUTH 3 (THREE) TIMES DAILY. (Patient taking differently: TAKE 0.5 TABLET BY MOUTH BID (WITH HYDROCODONE) AND 1 TABLET AT BEDTIME) 90 tablet 2  . venlafaxine XR (EFFEXOR-XR) 75 MG 24 hr capsule Take 1 capsule (75 mg total) by mouth daily with breakfast. Daily 30 capsule 0  . vitamin E 400 UNIT capsule Take 2 capsules by mouth every morning.      No current facility-administered medications for this visit.    Review of Systems Review of Systems  Constitutional: Negative.   Respiratory: Negative.     Blood pressure 128/74, pulse 72, resp. rate 12, height 5\' 6"  (1.676 m), weight 176 lb (79.833 kg).  Physical Exam Physical Exam  Constitutional: She appears well-developed and well-nourished.  Abdominal: Soft. She exhibits no mass. There is tenderness in the right lower quadrant. Hernia confirmed negative in the right inguinal area and confirmed negative in the left inguinal area.    Genitourinary:     Neurological: She is alert.  Skin: Skin is warm and dry.    Data Reviewed Pathology report  Assessment    Anal lesion highly suspicious for primary neoplasm. Left groin mass revealed metastatic squamous cell carcinoma likely from anal primary.    Plan   Discussed findings in detail with patient. Biopsy of anal lesion to confirm diagnosis of primary carcinoma was recommended and completed with her consent.  Patient was advised that she likely requires a PET scan for staging followed by chemotherapy and radiation.  Patient is also advised on placement of a port for chemotherapy.  She is agreeable to all of this.   Procedure note: Patient was placed on the left lateral position. The anal area was infiltrated with 1% xylocaine with epi 20mL at the base of the hemorrhoidal tissue. The area was prepped with betadine and several pieces of the central mass were removed for pathology. Bleeding was controlled with thermocautery. Area was dressed with  neosporin and gauze. Patient advised to keep neosporin or a similar antibiotic ointment on the area. No immediate problems from the procedure. Patient tolerated it well. Patient is to be scheduled for PET scan, oncology consult and port placement.    The patient is scheduled for a PET scan at [facility] on 03/04/16 at 8:30 am. She will report there at 8:00 am. She will have nothing to eat or drink after midnight. She is scheduled for surgery at [facility] on 03/10/16. She will pre admit by phone. The patient is aware of dates, time, and instructions. The [place] at [facility] will be in contact  with the patient to set her up with medical Oncology to see Dr Rogue Bussing, patient provided with their contact information.  PCP:  Sowles This information has been scribed by Gaspar Cola CMA.   SANKAR,SEEPLAPUTHUR G 02/26/2016, 10:49 AM

## 2016-03-12 NOTE — Transfer of Care (Signed)
Immediate Anesthesia Transfer of Care Note  Patient: Judy Allen  Procedure(s) Performed: Procedure(s): INSERTION PORT-A-CATH (Left)  Patient Location: PACU  Anesthesia Type:MAC  Level of Consciousness: sedated and responds to stimulation  Airway & Oxygen Therapy: Patient Spontanous Breathing and Patient connected to face mask oxygen  Post-op Assessment: Report given to RN and Post -op Vital signs reviewed and stable  Post vital signs: Reviewed and stable  Last Vitals:  Vitals:   03/12/16 0818 03/12/16 0819  BP: 113/72 113/72  Pulse: 64 64  Resp: 14 14  Temp: 36.7 C     Last Pain:  Vitals:   03/12/16 0617  TempSrc: Oral         Complications: No apparent anesthesia complications

## 2016-03-12 NOTE — Op Note (Signed)
Preop diagnosis: Squamous cell carcinoma the anal canal  Post op diagnosis: Same  Operation: Insertion venous access port  Surgeon: Mckinley Jewel  Assistant:     Anesthesia: Monitored care. Local anesthetic containing 0.5% Marcaine mixed with 1% Xylocaine totaling 20 mL  Complications: None  EBL: Minimal  Drains: None  Description: Patient was adequately sedated and placed in slight Trendelenburg position on the operating table. The left upper chest and neck area were prepped and draped as sterile field and timeout performed. Ultrasound probe was brought up to the field and the subclavian vein was identified beneath the lateral end of the clavicle. After instillation of local anesthetic, 1 cm incision was made and a needle successfully positioned in the subclavian vein with ultrasound guidance. Seldinger technique was then utilized to position the catheter going towards the distal superior vena cava. Initially the guidewire tended to go into the left internal jugular but with fluoroscopy the suggested and position in the superior vena cava. The outer sheath was removed. The subcutaneous pocket was created over the second interspace. This area was instilled with local anesthetic and incision was made. Subcutaneous pocket created with cautery. The catheter was tunneled through this site and cut to approximate length the skin mark at the initial entrance side with a 25 cm. The catheter was fixed to a prefilled port. Port was placed in the pocket and secured to the underlying fascia with 3 stitches of 2-0 Prolene. The  port was flushed through with the heparinized saline. Subcutaneous tissue closed with the 3-0 Vicryl and the skin closed with subcuticular 4-0 Vicryl covered with liqui  Ban. Patient subsequently was returned recovery room stable condition

## 2016-03-12 NOTE — Anesthesia Preprocedure Evaluation (Signed)
Anesthesia Evaluation  Patient identified by MRN, date of birth, ID band Patient awake    Reviewed: Allergy & Precautions, NPO status   History of Anesthesia Complications (+) PONV  Airway Mallampati: II       Dental  (+) Teeth Intact   Pulmonary former smoker,    breath sounds clear to auscultation       Cardiovascular Exercise Tolerance: Good hypertension, Pt. on home beta blockers  Rhythm:Regular     Neuro/Psych Anxiety Depression    GI/Hepatic Neg liver ROS, GERD  ,  Endo/Other  negative endocrine ROS  Renal/GU negative Renal ROS     Musculoskeletal   Abdominal Normal abdominal exam  (+)   Peds  Hematology  (+) anemia ,   Anesthesia Other Findings   Reproductive/Obstetrics                             Anesthesia Physical Anesthesia Plan  ASA: III  Anesthesia Plan: MAC   Post-op Pain Management:    Induction: Intravenous  Airway Management Planned: Natural Airway and Nasal Cannula  Additional Equipment:   Intra-op Plan:   Post-operative Plan:   Informed Consent: I have reviewed the patients History and Physical, chart, labs and discussed the procedure including the risks, benefits and alternatives for the proposed anesthesia with the patient or authorized representative who has indicated his/her understanding and acceptance.     Plan Discussed with: CRNA  Anesthesia Plan Comments:         Anesthesia Quick Evaluation

## 2016-03-12 NOTE — Interval H&P Note (Signed)
History and Physical Interval Note:  03/12/2016 7:03 AM  Judy Allen  has presented today for surgery, with the diagnosis of METASTATIC SQUALL CANCER  The various methods of treatment have been discussed with the patient and family. After consideration of risks, benefits and other options for treatment, the patient has consented to  Procedure(s): INSERTION PORT-A-CATH (N/A) as a surgical intervention .  The patient's history has been reviewed, patient examined, no change in status, stable for surgery.  I have reviewed the patient's chart and labs.  Questions were answered to the patient's satisfaction.     SANKAR,SEEPLAPUTHUR G

## 2016-03-12 NOTE — Discharge Instructions (Signed)
AMBULATORY SURGERY  DISCHARGE INSTRUCTIONS   May shower Activity as tolerated    1) The drugs that you were given will stay in your system until tomorrow so for the next 24 hours you should not:  A) Drive an automobile B) Make any legal decisions C) Drink any alcoholic beverage   2) You may resume regular meals tomorrow.  Today it is better to start with liquids and gradually work up to solid foods.  You may eat anything you prefer, but it is better to start with liquids, then soup and crackers, and gradually work up to solid foods.   3) Please notify your doctor immediately if you have any unusual bleeding, trouble breathing, redness and pain at the surgery site, drainage, fever, or pain not relieved by medication.    4) Additional Instructions:        Please contact your physician with any problems or Same Day Surgery at [phone], Monday through Friday 6 am to 4 pm, or [place] at [facility] number at [phone].

## 2016-03-12 NOTE — Anesthesia Procedure Notes (Signed)
Procedure Name: MAC Performed by: Jaretssi Kraker Pre-anesthesia Checklist: Patient identified, Emergency Drugs available, Suction available, Patient being monitored and Timeout performed Patient Re-evaluated:Patient Re-evaluated prior to induction Oxygen Delivery Method: Simple face mask       

## 2016-03-12 NOTE — H&P (View-Only) (Signed)
Patient ID: Judy Allen, female   DOB: 08-25-1955, 60 y.o.   MRN: [id number]  Chief Complaint  Patient presents with  . Other    left inguinal adenopathy    HPI Judy Allen is a 60 y.o. female here today for a evaluation of a knot in left groin. She states it has been there for 3 months. She states it is painful and getting larger. Bowels move regular, every other day, no bleeding. She did take prednisone about 1 month ago but it did not change the size of the knot. I have reviewed the history of present illness with the patient.   HPI  Past Medical History  Diagnosis Date  . H/O allergic rhinitis   . Neuritis   . Urinary incontinence   . Proteinuria   . GERD (gastroesophageal reflux disease)   . Hyperlipidemia   . Ovarian failure   . Constipation   . Psoriasis   . Heart murmur   . Tachycardia, paroxysmal ([place])   . Hypertension   . Hemorrhoid     Past Surgical History  Procedure Laterality Date  . Tubal ligation      s/p  . Tonsillectomy  1959    history  . Herniated disc repair    . Anterior fusion lumbar spine    . Cardiac catheterization  2009?     [facility]  . Cardiac catheterization  05/2012    RHC: Mild hypertension 37/12 with a mean pressure of 21 mm mercury    Family History  Problem Relation Age of Onset  . Heart attack Mother   . Breast cancer Maternal Aunt     Social History Social History  Substance Use Topics  . Smoking status: Former Smoker -- 1.00 packs/day for 32 years    Types: Cigarettes    Start date: 08/10/1963    Quit date: 01/15/2006  . Smokeless tobacco: Never Used  . Alcohol Use: No    Allergies  Allergen Reactions  . Sulfa Antibiotics     Current Outpatient Prescriptions  Medication Sig Dispense Refill  . ALPRAZolam (XANAX XR) 1 MG 24 hr tablet Take 1 tablet (1 mg total) by mouth daily. 30 tablet 2  . clobetasol cream (TEMOVATE) 0.05 % Apply topically 2 (two) times daily. 60 g 2  . desonide (DESOWEN) 0.05 %  cream Apply topically 2 (two) times daily. 60 g 2  . divalproex (DEPAKOTE) 250 MG DR tablet Take 1 tablet (250 mg total) by mouth at bedtime. 30 tablet 5  . fluticasone (FLONASE) 50 MCG/ACT nasal spray Place 2 sprays into both nostrils daily. 16 g 6  . gabapentin (NEURONTIN) 300 MG capsule TAKE 1 CAPSULE TWICE A DAY 180 capsule 1  . gemfibrozil (LOPID) 600 MG tablet Take 1 tablet (600 mg total) by mouth daily. 30 tablet 5  . hydrochlorothiazide (MICROZIDE) 12.5 MG capsule Take 1 capsule (12.5 mg total) by mouth daily. 90 capsule 1  . HYDROcodone-acetaminophen (NORCO/VICODIN) 5-325 MG tablet Take 1 tablet by mouth 2 (two) times daily.    . metoprolol (LOPRESSOR) 50 MG tablet Take 1 tablet (50 mg total) by mouth 2 (two) times daily. 180 tablet 1  . omeprazole (PRILOSEC) 40 MG capsule TAKE 1 CAPSULE EVERY DAY 30 capsule 5  . polyethylene glycol powder (GLYCOLAX/MIRALAX) powder Take 17 g by mouth daily. 3350 g 2  . tiZANidine (ZANAFLEX) 4 MG tablet TAKE 1 TABLET (4 MG TOTAL) BY MOUTH 3 (THREE) TIMES DAILY. 90 tablet 2  . venlafaxine XR (  EFFEXOR-XR) 75 MG 24 hr capsule Take 1 capsule (75 mg total) by mouth daily with breakfast. Daily 30 capsule 0  . vitamin E 400 UNIT capsule Take 1 capsule by mouth 2 (two) times daily.     No current facility-administered medications for this visit.    Review of Systems Review of Systems  Constitutional: Negative.   Respiratory: Negative.   Cardiovascular: Negative.   Gastrointestinal: Negative for nausea, vomiting, diarrhea and constipation.    Blood pressure 120/82, pulse 74, resp. rate 12, height 5' 4"  (1.626 m), weight 174 lb (78.926 kg).  Physical Exam Physical Exam  Constitutional: She is oriented to person, place, and time. She appears well-developed and well-nourished.  HENT:  Mouth/Throat: Oropharynx is clear and moist.  Eyes: Conjunctivae are normal. No scleral icterus.  Neck: Neck supple.  Cardiovascular: Normal rate and regular rhythm.    Pulmonary/Chest: Effort normal and breath sounds normal.  Abdominal: Soft. Normal appearance and bowel sounds are normal. There is no splenomegaly or hepatomegaly. There is tenderness.  3-4 cm firm non mobile mass to the left of the pubis.   Lymphadenopathy:    She has no cervical adenopathy.    She has no axillary adenopathy.       Left: Inguinal adenopathy present.  Neurological: She is alert and oriented to person, place, and time.  Skin: Skin is warm and dry.  Psychiatric: Her behavior is normal.    Data Reviewed Notes reviewed  Assessment    3-4 cm firm non mobile mass to the left of the pubis. Ultrasound preformed-shows a large hypoechoic mass with some hyperechoic  areas in middle.  Central vascularity noted. Possible this is a femoral hernia.       Plan    Recommend excison of the left groin mass or repair femoral hernia depending on operative finding-patient agrees.    Patient's surgery has been scheduled for 02-23-16 at [facility].  PCP:  Steele Sizer This information has been scribed by Gaspar Cola CMA.   SANKAR,SEEPLAPUTHUR G 02/18/2016, 5:02 PM

## 2016-03-12 NOTE — Anesthesia Postprocedure Evaluation (Signed)
Anesthesia Post Note  Patient: Judy Allen  Procedure(s) Performed: Procedure(s) (LRB): INSERTION PORT-A-CATH (Left)  Patient location during evaluation: PACU Anesthesia Type: MAC Level of consciousness: awake Pain management: pain level controlled Vital Signs Assessment: post-procedure vital signs reviewed and stable Respiratory status: spontaneous breathing Cardiovascular status: stable Anesthetic complications: no    Last Vitals:  Vitals:   03/12/16 0818 03/12/16 0819  BP: 113/72 113/72  Pulse: 64 64  Resp: 14 14  Temp: 36.7 C     Last Pain:  Vitals:   03/12/16 0617  TempSrc: Oral                 VAN STAVEREN,Mitsuko Luera

## 2016-03-12 NOTE — Interval H&P Note (Signed)
History and Physical Interval Note:  03/12/2016 7:03 AM  Judy Allen  has presented today for surgery, with the diagnosis of METASTATIC SQUALL CANCER  The various methods of treatment have been discussed with the patient and family. After consideration of risks, benefits and other options for treatment, the patient has consented to  Procedure(s): INSERTION PORT-A-CATH (N/A) as a surgical intervention .  The patient's history has been reviewed, patient examined, no change in status, stable for surgery.  I have reviewed the patient's chart and labs.  Questions were answered to the patient's satisfaction.     [name]   

## 2016-03-24 ENCOUNTER — Ambulatory Visit: Payer: 59 | Admitting: General Surgery

## 2016-03-26 ENCOUNTER — Telehealth: Payer: Self-pay | Admitting: *Deleted

## 2016-03-26 NOTE — Telephone Encounter (Signed)
Patient states when she moves her left arm, she is in pain at port site.Jamal Collin to see patient on Tuesday.

## 2016-03-30 ENCOUNTER — Ambulatory Visit (INDEPENDENT_AMBULATORY_CARE_PROVIDER_SITE_OTHER): Payer: 59 | Admitting: General Surgery

## 2016-03-30 ENCOUNTER — Encounter: Payer: Self-pay | Admitting: General Surgery

## 2016-03-30 VITALS — BP 126/78 | HR 72 | Resp 12 | Ht 66.0 in | Wt 176.0 lb

## 2016-03-30 DIAGNOSIS — IMO0002 Reserved for concepts with insufficient information to code with codable children: Secondary | ICD-10-CM

## 2016-03-30 DIAGNOSIS — C799 Secondary malignant neoplasm of unspecified site: Secondary | ICD-10-CM

## 2016-03-30 DIAGNOSIS — C801 Malignant (primary) neoplasm, unspecified: Secondary | ICD-10-CM

## 2016-03-30 NOTE — Progress Notes (Signed)
Patient ID: Judy Allen, female   DOB: Jan 18, 1956, 60 y.o.   MRN: [id number]  Chief Complaint  Patient presents with  . Follow-up    port placement    HPI Judy Allen is a 60 y.o. female here today for her two week follow up port placement. Patient states she started on radiation and chemo yesterday. No problems with the port. I have reviewed the history of present illness with the patient.  HPI  Past Medical History:  Diagnosis Date  . Anemia    H/O  . Anxiety   . Arthritis   . Complication of anesthesia   . Constipation   . Dysrhythmia    TACHYCARDIA-WELL CONTROLLED ON METOPROLO  . GERD (gastroesophageal reflux disease)   . H/O allergic rhinitis   . Heart murmur   . Hemorrhoid   . Hyperlipidemia   . Hypertension   . Neck pain, chronic   . Neuritis   . Ovarian failure   . PONV (postoperative nausea and vomiting)   . Proteinuria   . Psoriasis   . Tachycardia, paroxysmal ([place])   . Urinary incontinence     Past Surgical History:  Procedure Laterality Date  . ANTERIOR FUSION LUMBAR SPINE    . CARDIAC CATHETERIZATION  2009?    [facility]  . CARDIAC CATHETERIZATION  05/2012   RHC: Mild hypertension 37/12 with a mean pressure of 21 mm mercury  . COLONOSCOPY  2008  . herniated disc repair    . MASS EXCISION Left 02/23/2016   Procedure: EXCISION MASS;  Surgeon: Christene Lye, MD;  Location: [facility] ORS;  Service: General;  Laterality: Left;  . NECK SURGERY    . PORTACATH PLACEMENT Left 03/12/2016   Procedure: INSERTION PORT-A-CATH;  Surgeon: Christene Lye, MD;  Location: [facility] ORS;  Service: General;  Laterality: Left;  . tonsillectomy  1959   history  . TUBAL LIGATION     s/p    Family History  Problem Relation Age of Onset  . Heart attack Mother   . Breast cancer Maternal Aunt     Social History Social History  Substance Use Topics  . Smoking status: Former Smoker    Packs/day: 1.00    Years: 32.00    Types: Cigarettes    Start date:  08/10/1963    Quit date: 01/15/2006  . Smokeless tobacco: Never Used  . Alcohol use No    Allergies  Allergen Reactions  . Sulfa Antibiotics Rash    Current Outpatient Prescriptions  Medication Sig Dispense Refill  . ALPRAZolam (XANAX XR) 1 MG 24 hr tablet Take 1 tablet (1 mg total) by mouth daily. (Patient taking differently: Take 1 mg by mouth every morning. ) 30 tablet 2  . clobetasol cream (TEMOVATE) 0.05 % Apply topically 2 (two) times daily. 60 g 2  . desonide (DESOWEN) 0.05 % cream Apply topically 2 (two) times daily. (Patient taking differently: Apply topically 2 (two) times daily. FACE AND PRIVATE AREA) 60 g 2  . divalproex (DEPAKOTE) 250 MG DR tablet Take 1 tablet (250 mg total) by mouth at bedtime. 30 tablet 5  . gabapentin (NEURONTIN) 300 MG capsule TAKE 1 CAPSULE TWICE A DAY 180 capsule 1  . gemfibrozil (LOPID) 600 MG tablet Take 1 tablet (600 mg total) by mouth daily. (Patient taking differently: Take 600 mg by mouth daily with lunch. ) 30 tablet 5  . hydrochlorothiazide (MICROZIDE) 12.5 MG capsule Take 1 capsule (12.5 mg total) by mouth daily. 90 capsule 1  .  HYDROcodone-acetaminophen (NORCO/VICODIN) 5-325 MG tablet Take 0.5 tablets by mouth 2 (two) times daily. DURING THE DAY WHILE AT WORK    . HYDROcodone-acetaminophen (NORCO/VICODIN) 5-325 MG tablet Take 1 tablet by mouth at bedtime.    . metoprolol (LOPRESSOR) 50 MG tablet TAKE 1 TABLET (50 MG TOTAL) BY MOUTH 2 (TWO) TIMES DAILY. 180 tablet 1  . omeprazole (PRILOSEC) 40 MG capsule TAKE 1 CAPSULE EVERY DAY (Patient taking differently: TAKE 1 CAPSULE EVERY DAY-AM) 30 capsule 5  . tiZANidine (ZANAFLEX) 4 MG tablet TAKE 1 TABLET (4 MG TOTAL) BY MOUTH 3 (THREE) TIMES DAILY. (Patient taking differently: TAKE 0.5 TABLET BY MOUTH BID (WITH HYDROCODONE) AND 1 TABLET AT BEDTIME) 90 tablet 2  . traMADol (ULTRAM) 50 MG tablet Take 1 tablet (50 mg total) by mouth every 6 (six) hours as needed. 30 tablet 0  . venlafaxine XR (EFFEXOR-XR) 75  MG 24 hr capsule Take 1 capsule (75 mg total) by mouth daily with breakfast. Daily 30 capsule 0  . vitamin E 400 UNIT capsule Take 2 capsules by mouth every morning.      No current facility-administered medications for this visit.     Review of Systems Review of Systems  Constitutional: Negative.   Respiratory: Negative.   Cardiovascular: Negative.     Blood pressure 126/78, pulse 72, resp. rate 12, height 5\' 6"  (1.676 m), weight 176 lb (79.8 kg), last menstrual period 03/04/2002.  Physical Exam Physical Exam  Constitutional: She is oriented to person, place, and time. She appears well-developed and well-nourished.  Cardiovascular: Normal rate, regular rhythm and normal heart sounds.   Pulmonary/Chest: Effort normal and breath sounds normal.  Abdominal:    Neurological: She is alert and oriented to person, place, and time.  Skin: Skin is warm and dry.    Data Reviewed  Prior notes reviewed  Assessment    Port is clean and healing well.    Plan       Patient to return in two months after treatment This information has been scribed by Gaspar Cola CMA.    Keagan Anthis G 03/30/2016, 8:48 AM

## 2016-03-30 NOTE — Patient Instructions (Signed)
    Patient to return in two months after treatment

## 2016-04-07 ENCOUNTER — Other Ambulatory Visit: Payer: Self-pay | Admitting: Family Medicine

## 2016-04-08 ENCOUNTER — Other Ambulatory Visit: Payer: Self-pay

## 2016-04-08 ENCOUNTER — Other Ambulatory Visit: Payer: Self-pay | Admitting: Family Medicine

## 2016-04-08 ENCOUNTER — Encounter: Payer: Self-pay | Admitting: Family Medicine

## 2016-04-08 ENCOUNTER — Ambulatory Visit (INDEPENDENT_AMBULATORY_CARE_PROVIDER_SITE_OTHER): Payer: 59 | Admitting: Family Medicine

## 2016-04-08 VITALS — BP 118/64 | HR 84 | Temp 98.1°F | Resp 16 | Ht 66.0 in | Wt 176.1 lb

## 2016-04-08 DIAGNOSIS — E781 Pure hyperglyceridemia: Secondary | ICD-10-CM

## 2016-04-08 DIAGNOSIS — F339 Major depressive disorder, recurrent, unspecified: Secondary | ICD-10-CM | POA: Diagnosis not present

## 2016-04-08 DIAGNOSIS — M542 Cervicalgia: Secondary | ICD-10-CM | POA: Diagnosis not present

## 2016-04-08 DIAGNOSIS — Z124 Encounter for screening for malignant neoplasm of cervix: Secondary | ICD-10-CM

## 2016-04-08 DIAGNOSIS — G8929 Other chronic pain: Secondary | ICD-10-CM

## 2016-04-08 DIAGNOSIS — Z0000 Encounter for general adult medical examination without abnormal findings: Secondary | ICD-10-CM | POA: Diagnosis not present

## 2016-04-08 DIAGNOSIS — F411 Generalized anxiety disorder: Secondary | ICD-10-CM | POA: Diagnosis not present

## 2016-04-08 DIAGNOSIS — E8881 Metabolic syndrome: Secondary | ICD-10-CM | POA: Diagnosis not present

## 2016-04-08 DIAGNOSIS — R1011 Right upper quadrant pain: Secondary | ICD-10-CM | POA: Diagnosis not present

## 2016-04-08 DIAGNOSIS — Z01419 Encounter for gynecological examination (general) (routine) without abnormal findings: Secondary | ICD-10-CM

## 2016-04-08 DIAGNOSIS — I10 Essential (primary) hypertension: Secondary | ICD-10-CM | POA: Diagnosis not present

## 2016-04-08 DIAGNOSIS — C210 Malignant neoplasm of anus, unspecified: Secondary | ICD-10-CM

## 2016-04-08 MED ORDER — ALPRAZOLAM ER 1 MG PO TB24: 1.0000 mg | ORAL_TABLET | ORAL | 2 refills | Status: DC

## 2016-04-08 MED ORDER — HYDROCODONE-ACETAMINOPHEN 7.5-325 MG PO TABS: 1.0000 | ORAL_TABLET | Freq: Two times a day (BID) | ORAL | 0 refills | Status: DC

## 2016-04-08 MED ORDER — HYDROCHLOROTHIAZIDE 12.5 MG PO CAPS: 12.5000 mg | ORAL_CAPSULE | Freq: Every day | ORAL | 1 refills | Status: DC

## 2016-04-08 MED ORDER — TIZANIDINE HCL 4 MG PO TABS: ORAL_TABLET | ORAL | 2 refills | Status: DC

## 2016-04-08 MED ORDER — VENLAFAXINE HCL ER 150 MG PO CP24: 150.0000 mg | ORAL_CAPSULE | Freq: Every day | ORAL | 1 refills | Status: DC

## 2016-04-08 NOTE — Progress Notes (Signed)
Name: Judy Allen   MRN: [id number]    DOB: 1956-06-19   Date:04/08/2016       Progress Note  Subjective  Chief Complaint  Chief Complaint  Patient presents with  . Annual Exam    HPI  Well Woman: she is here today for health check and also follow up. She is having chemotherapy and radiation therapy for anal cancer done at [facility] She recently got pneumococcal vaccine. Started treatment 10 days ago and is tolerating well, except for nausea and diarrhea. Last pap smear was in 2014. Mammogram is up to date.  Neck pain: It has been worse over the past few months, she was sent back to [facility]. She had a steroid injection injection for her facet arthropathy but there was no improvement of the pain. She was advised to try otc nsaid's and continue hydrocodone. Pain is described as sharp and throbbing pain, sometimes burning, currently a 1/10 and currently feels like an achy pain  Chronic low back pain: she was seen by [facility], history of lumbar spine surgery and had injections by Dr. Arneta Cliche in the past with improvement of symptoms, no radiculitis, worse when bending forward.   GAD/Depression Major: currently on Effexor and Depakote  because Abilify and Cymbalta was too expenisve- no longer could afford. She was doing well, but now dealing with new cancer diagnosis and is crying more again, we will increase dose of Effexor. She denies suicidal thoughts or ideation, no lack of appetite, she has anhedonia. Family is supportive, living with daughter.   Patient Active Problem List   Diagnosis Date Noted  . Anal squamous cell carcinoma ([place]) 03/09/2016  . Metastatic squamous cell carcinoma ([place]) 02/25/2016  . Non-thrombocytopenic purpura ([place]) 01/12/2016  . Chronic constipation 10/16/2015  . History of fusion of cervical spine 04/02/2015  . Benign hypertension 01/16/2015  . Chronic cervical pain 01/16/2015  . CN (constipation) 01/16/2015  . Gastric reflux  01/16/2015  . Hypertriglyceridemia 01/16/2015  . Edema leg 01/16/2015  . Chronic recurrent major depressive disorder ([place]) 01/16/2015  . Dysmetabolic syndrome [id number]  . Obstructive apnea 01/16/2015  . Psoriasis 01/16/2015  . Allergic rhinitis 01/16/2015  . Bursitis, trochanteric 01/16/2015  . GAD (generalized anxiety disorder) 01/16/2015  . Tachycardia, paroxysmal [facility])     Past Surgical History:  Procedure Laterality Date  . ANTERIOR FUSION LUMBAR SPINE    . CARDIAC CATHETERIZATION  2009?    [facility]  . CARDIAC CATHETERIZATION  05/2012   RHC: Mild hypertension 37/12 with a mean pressure of 21 mm mercury  . COLONOSCOPY  2008  . herniated disc repair    . MASS EXCISION Left 02/23/2016   Procedure: EXCISION MASS;  Surgeon: Christene Lye, MD;  Location: [facility] ORS;  Service: General;  Laterality: Left;  . NECK SURGERY    . PORTACATH PLACEMENT Left 03/12/2016   Procedure: INSERTION PORT-A-CATH;  Surgeon: Christene Lye, MD;  Location: [facility] ORS;  Service: General;  Laterality: Left;  . tonsillectomy  1959   history  . TUBAL LIGATION     s/p    Family History  Problem Relation Age of Onset  . Heart attack Mother   . Breast cancer Maternal Aunt     Social History   Social History  . Marital status: Married    Spouse name: N/A  . Number of children: N/A  . Years of education: N/A   Occupational History  . Not on file.   Social History Main Topics  .  Smoking status: Former Smoker    Packs/day: 1.00    Years: 32.00    Types: Cigarettes    Start date: 08/10/1963    Quit date: 01/15/2006  . Smokeless tobacco: Never Used  . Alcohol use No  . Drug use: No  . Sexual activity: No   Other Topics Concern  . Not on file   Social History Narrative  . No narrative on file     Current Outpatient Prescriptions:  .  ALPRAZolam (XANAX XR) 1 MG 24 hr tablet, Take 1 tablet (1 mg total) by mouth every morning., Disp: 30 tablet, Rfl: 2 .  clobetasol cream  (TEMOVATE) 0.05 %, Apply topically 2 (two) times daily., Disp: 60 g, Rfl: 2 .  desonide (DESOWEN) 0.05 % cream, Apply topically 2 (two) times daily. (Patient taking differently: Apply topically 2 (two) times daily. FACE AND PRIVATE AREA), Disp: 60 g, Rfl: 2 .  divalproex (DEPAKOTE) 250 MG DR tablet, Take 1 tablet (250 mg total) by mouth at bedtime., Disp: 30 tablet, Rfl: 5 .  gabapentin (NEURONTIN) 300 MG capsule, TAKE 1 CAPSULE TWICE A DAY, Disp: 180 capsule, Rfl: 1 .  gemfibrozil (LOPID) 600 MG tablet, Take 1 tablet (600 mg total) by mouth daily. (Patient taking differently: Take 600 mg by mouth daily with lunch. ), Disp: 30 tablet, Rfl: 5 .  hydrochlorothiazide (MICROZIDE) 12.5 MG capsule, Take 1 capsule (12.5 mg total) by mouth daily., Disp: 90 capsule, Rfl: 1 .  HYDROcodone-acetaminophen (NORCO) 7.5-325 MG tablet, Take 1 tablet by mouth 2 (two) times daily., Disp: 60 tablet, Rfl: 0 .  loperamide (IMODIUM) 2 MG capsule, Take 2 capsules to start then take 1 capsule every 2 hours until diarrhea free for 12 hours., Disp: , Rfl:  .  metoprolol (LOPRESSOR) 50 MG tablet, TAKE 1 TABLET (50 MG TOTAL) BY MOUTH 2 (TWO) TIMES DAILY., Disp: 180 tablet, Rfl: 1 .  omeprazole (PRILOSEC) 40 MG capsule, TAKE 1 CAPSULE EVERY DAY, Disp: 30 capsule, Rfl: 5 .  ondansetron (ZOFRAN) 8 MG tablet, Take 8 mg by mouth., Disp: , Rfl:  .  prochlorperazine (COMPAZINE) 10 MG tablet, Take 10 mg by mouth., Disp: , Rfl:  .  tiZANidine (ZANAFLEX) 4 MG tablet, TAKE 1 TABLET (4 MG TOTAL) BY MOUTH 3 (THREE) TIMES DAILY., Disp: 90 tablet, Rfl: 2 .  venlafaxine XR (EFFEXOR-XR) 150 MG 24 hr capsule, Take 1 capsule (150 mg total) by mouth daily with breakfast. Daily, Disp: 90 capsule, Rfl: 1 .  HYDROcodone-acetaminophen (NORCO) 7.5-325 MG tablet, Take 1 tablet by mouth 2 (two) times daily., Disp: 60 tablet, Rfl: 0 .  HYDROcodone-acetaminophen (NORCO) 7.5-325 MG tablet, Take 1 tablet by mouth 2 (two) times daily., Disp: 60 tablet, Rfl:  0  Allergies  Allergen Reactions  . Sulfa Antibiotics Rash     ROS  Constitutional: Negative for fever or weight change.  Respiratory: Negative for cough and shortness of breath.   Cardiovascular: Negative for chest pain or palpitations.  Gastrointestinal: Negative for abdominal pain, positive for  bowel changes.  Musculoskeletal: Negative for gait problem or joint swelling.  Skin: Negative for rash.  Neurological: Negative for dizziness or headache.  No other specific complaints in a complete review of systems (except as listed in HPI above).  Objective  Vitals:   04/08/16 1053  BP: 118/64  Pulse: 84  Resp: 16  Temp: 98.1 F (36.7 C)  TempSrc: Oral  SpO2: 96%  Weight: 176 lb 1.6 oz (79.9 kg)  Height: 5\' 6"  (1.676  m)    Body mass index is 28.42 kg/m.  Physical Exam  Constitutional: Patient appears well-developed and obese No distress.  HENT: Head: Normocephalic and atraumatic. Ears: B TMs ok, no erythema or effusion; Nose: Nose normal. Mouth/Throat: Oropharynx is clear and moist. No oropharyngeal exudate.  Eyes: Conjunctivae and EOM are normal. Pupils are equal, round, and reactive to light. No scleral icterus.  Neck: Normal range of motion. Neck supple. No JVD present. No thyromegaly present.  Cardiovascular: Normal rate, regular rhythm and normal heart sounds.  No murmur heard. No BLE edema. Pulmonary/Chest: Effort normal and breath sounds normal. No respiratory distress. Abdominal: Soft. Bowel sounds are normal, no distension. There is no tenderness. no masses Breast: no lumps or masses, no nipple discharge or rashes FEMALE GENITALIA:  External genitalia normal External urethra normal Vaginal vault normal without discharge or lesions Cervix normal without discharge or lesions Bimanual exam normal without masses RECTAL: growth on anal area - some oozing and redness no bleeding, digital exam not done Musculoskeletal: Normal range of motion, no joint effusions.  No gross deformities Neurological: he is alert and oriented to person, place, and time. No cranial nerve deficit. Coordination, balance, strength, speech and gait are normal.  Skin: Skin is warm and dry. Psoriatic plaques, and upper arms bruising Psychiatric: Patient has a normal mood and affect. behavior is normal. Judgment and thought content normal.      PHQ2/9: Depression screen [facility] 2/9 04/08/2016 01/12/2016 11/03/2015 10/16/2015 07/17/2015  Decreased Interest 1 0 0 3 0  Down, Depressed, Hopeless 2 0 0 3 0  PHQ - 2 Score 3 0 0 6 0  Altered sleeping 3 - - 3 -  Tired, decreased energy 3 - - 3 -  Change in appetite 0 - - 0 -  Feeling bad or failure about yourself  3 - - 0 -  Trouble concentrating 0 - - 0 -  Moving slowly or fidgety/restless 3 - - 0 -  Suicidal thoughts 0 - - 0 -  PHQ-9 Score 15 - - 12 -  Difficult doing work/chores Somewhat difficult - - Not difficult at all -     Fall Risk: Fall Risk  04/08/2016 01/12/2016 11/03/2015 10/16/2015 07/17/2015  Falls in the past year? No No No No No     Functional Status Survey: Is the patient deaf or have difficulty hearing?: No Does the patient have difficulty seeing, even when wearing glasses/contacts?: No Does the patient have difficulty concentrating, remembering, or making decisions?: No Does the patient have difficulty walking or climbing stairs?: No Does the patient have difficulty dressing or bathing?: No Does the patient have difficulty doing errands alone such as visiting a doctor's office or shopping?: No    Assessment & Plan  1. Well woman exam  Discussed importance of 150 minutes of physical activity weekly, eat two servings of fish weekly, eat one serving of tree nuts ( cashews, pistachios, pecans, almonds.Marland Kitchen) every other day, eat 6 servings of fruit/vegetables daily and drink plenty of water and avoid sweet beverages.   2. Benign hypertension  - hydrochlorothiazide (MICROZIDE) 12.5 MG capsule; Take 1 capsule (12.5 mg  total) by mouth daily.  Dispense: 90 capsule; Refill: 1  3. GAD (generalized anxiety disorder)  - ALPRAZolam (XANAX XR) 1 MG 24 hr tablet; Take 1 tablet (1 mg total) by mouth every morning.  Dispense: 30 tablet; Refill: 2 - venlafaxine XR (EFFEXOR-XR) 150 MG 24 hr capsule; Take 1 capsule (150 mg total) by mouth daily with  breakfast. Daily  Dispense: 90 capsule; Refill: 1  4. Chronic recurrent major depressive disorder (HCC)  - venlafaxine XR (EFFEXOR-XR) 150 MG 24 hr capsule; Take 1 capsule (150 mg total) by mouth daily with breakfast. Daily  Dispense: 90 capsule; Refill: 1  5. Dysmetabolic syndrome  - Hemoglobin A1c  6. Chronic cervical pain  - tiZANidine (ZANAFLEX) 4 MG tablet; TAKE 1 TABLET (4 MG TOTAL) BY MOUTH 3 (THREE) TIMES DAILY.  Dispense: 90 tablet; Refill: 2 - HYDROcodone-acetaminophen (NORCO) 7.5-325 MG tablet; Take 1 tablet by mouth 2 (two) times daily.  Dispense: 60 tablet; Refill: 0 - HYDROcodone-acetaminophen (NORCO) 7.5-325 MG tablet; Take 1 tablet by mouth 2 (two) times daily.  Dispense: 60 tablet; Refill: 0 - HYDROcodone-acetaminophen (NORCO) 7.5-325 MG tablet; Take 1 tablet by mouth 2 (two) times daily.  Dispense: 60 tablet; Refill: 0  7. Anal squamous cell carcinoma ([place])  Keep follow up at [facility]  8. Hypertriglyceridemia  - Lipid panel  9. Cervical cancer screening  - Pap IG, CT/NG NAA, and HPV (high risk)   10. Right upper quadrant pain  -[place] RUQ

## 2016-04-09 LAB — LIPID PANEL
CHOL/HDL RATIO: 3.2 ratio (ref <=5.0)
Cholesterol: 147 mg/dL (ref 125–200)
HDL: 46 mg/dL (ref >=46)
LDL CALC: 44 mg/dL (ref <130)
Triglycerides: 286 mg/dL — ABNORMAL HIGH (ref <150)
VLDL: 57 mg/dL — AB (ref <30)

## 2016-04-09 LAB — HEMOGLOBIN A1C
HEMOGLOBIN A1C: 6 % — AB (ref <5.7)
Mean Plasma Glucose: 126 mg/dL

## 2016-04-13 ENCOUNTER — Ambulatory Visit: Payer: 59 | Admitting: Family Medicine

## 2016-04-14 ENCOUNTER — Other Ambulatory Visit: Payer: Self-pay | Admitting: Family Medicine

## 2016-04-14 DIAGNOSIS — IMO0002 Reserved for concepts with insufficient information to code with codable children: Secondary | ICD-10-CM

## 2016-04-14 DIAGNOSIS — R87619 Unspecified abnormal cytological findings in specimens from cervix uteri: Secondary | ICD-10-CM | POA: Insufficient documentation

## 2016-04-14 LAB — PAP IG, CT-NG NAA, HPV HIGH-RISK
CHLAMYDIA PROBE AMP: NOT DETECTED
GC PROBE AMP: NOT DETECTED
HPV DNA HIGH RISK: NOT DETECTED

## 2016-04-16 ENCOUNTER — Ambulatory Visit
Admission: RE | Admit: 2016-04-16 | Discharge: 2016-04-16 | Disposition: A | Discharge status: Home / Self Care | Payer: Commercial Managed Care - HMO | Source: Ambulatory Visit | Attending: Family Medicine | Admitting: Family Medicine

## 2016-04-16 DIAGNOSIS — C210 Malignant neoplasm of anus, unspecified: Secondary | ICD-10-CM | POA: Insufficient documentation

## 2016-04-16 DIAGNOSIS — K760 Fatty (change of) liver, not elsewhere classified: Secondary | ICD-10-CM | POA: Insufficient documentation

## 2016-04-16 DIAGNOSIS — R1011 Right upper quadrant pain: Secondary | ICD-10-CM | POA: Insufficient documentation

## 2016-05-03 ENCOUNTER — Other Ambulatory Visit: Payer: Self-pay | Admitting: Family Medicine

## 2016-05-03 DIAGNOSIS — F339 Major depressive disorder, recurrent, unspecified: Secondary | ICD-10-CM

## 2016-05-03 DIAGNOSIS — F411 Generalized anxiety disorder: Secondary | ICD-10-CM

## 2016-05-03 NOTE — Telephone Encounter (Signed)
Patient requesting refill of Depakote and Effexor to [facility].

## 2016-05-20 ENCOUNTER — Telehealth: Payer: Self-pay

## 2016-05-20 NOTE — Telephone Encounter (Signed)
Informed patient she was on Cymbalta in the past but currently on Effexor. Patient states she has been in the hospital and performing her last treatment today.

## 2016-05-20 NOTE — Telephone Encounter (Signed)
Was just asking what medication was she was on the in the past. That was it just wanted to document it.

## 2016-05-20 NOTE — Telephone Encounter (Signed)
I can't see the rest of the message. What does she need?

## 2016-05-25 ENCOUNTER — Telehealth: Payer: Self-pay | Admitting: Family Medicine

## 2016-05-25 NOTE — Telephone Encounter (Signed)
ERRENOUS °

## 2016-05-27 ENCOUNTER — Ambulatory Visit: Payer: 59 | Admitting: Family Medicine

## 2016-05-31 ENCOUNTER — Ambulatory Visit (INDEPENDENT_AMBULATORY_CARE_PROVIDER_SITE_OTHER): Payer: 59 | Admitting: General Surgery

## 2016-05-31 ENCOUNTER — Ambulatory Visit: Payer: 59 | Admitting: General Surgery

## 2016-05-31 ENCOUNTER — Encounter: Payer: Self-pay | Admitting: General Surgery

## 2016-05-31 VITALS — BP 128/78 | HR 76 | Resp 12 | Ht 64.0 in | Wt 171.0 lb

## 2016-05-31 DIAGNOSIS — C799 Secondary malignant neoplasm of unspecified site: Secondary | ICD-10-CM | POA: Diagnosis not present

## 2016-05-31 DIAGNOSIS — C210 Malignant neoplasm of anus, unspecified: Secondary | ICD-10-CM

## 2016-05-31 DIAGNOSIS — IMO0002 Reserved for concepts with insufficient information to code with codable children: Secondary | ICD-10-CM

## 2016-05-31 DIAGNOSIS — C801 Malignant (primary) neoplasm, unspecified: Secondary | ICD-10-CM

## 2016-05-31 NOTE — Progress Notes (Signed)
Patient ID: Judy Allen, female   DOB: [birth year], 60 y.o.   MRN: [id number]  Chief Complaint  Patient presents with  . Follow-up    anal cancer    HPI Judy Allen is a 60 y.o. female following up for anal canal cancer. She has competed her chemotherapy and radiation treatments. He last dose was 05/21/16. States she has intermittent mild diarrhea and occasional rectal bleeding. She is applying ointment in rectal area for peeling skin. Overall, she has done well with her treatment. I have reviewed the history of present illness with the patient.  HPI  Past Medical History:  Diagnosis Date  . Anemia    H/O  . Anxiety   . Arthritis   . Complication of anesthesia   . Constipation   . Dysrhythmia    TACHYCARDIA-WELL CONTROLLED ON METOPROLO  . GERD (gastroesophageal reflux disease)   . H/O allergic rhinitis   . Heart murmur   . Hemorrhoid   . Hyperlipidemia   . Hypertension   . Neck pain, chronic   . Neuritis   . Ovarian failure   . PONV (postoperative nausea and vomiting)   . Proteinuria   . Psoriasis   . Tachycardia, paroxysmal ([place])   . Urinary incontinence     Past Surgical History:  Procedure Laterality Date  . ANTERIOR FUSION LUMBAR SPINE    . CARDIAC CATHETERIZATION  2009?    [facility]  . CARDIAC CATHETERIZATION  05/2012   [facility]: Mild hypertension 37/12 with a mean pressure of 21 mm mercury  . COLONOSCOPY  2008  . herniated disc repair    . MASS EXCISION Left 02/23/2016   Procedure: EXCISION MASS;  Surgeon: Christene Lye, MD;  Location: [facility] ORS;  Service: General;  Laterality: Left;  . NECK SURGERY    . PORTACATH PLACEMENT Left 03/12/2016   Procedure: INSERTION PORT-A-CATH;  Surgeon: Christene Lye, MD;  Location: [facility] ORS;  Service: General;  Laterality: Left;  . tonsillectomy  1959   history  . TUBAL LIGATION     s/p    Family History  Problem Relation Age of Onset  . Heart attack Mother   . Breast cancer Maternal Aunt     Social  History Social History  Substance Use Topics  . Smoking status: Former Smoker    Packs/day: 1.00    Years: 32.00    Types: Cigarettes    Start date: 08/10/1963    Quit date: 01/15/2006  . Smokeless tobacco: Never Used  . Alcohol use No    Allergies  Allergen Reactions  . Sulfa Antibiotics Rash    Current Outpatient Prescriptions  Medication Sig Dispense Refill  . ALPRAZolam (XANAX XR) 1 MG 24 hr tablet Take 1 tablet (1 mg total) by mouth every morning. 30 tablet 2  . clobetasol cream (TEMOVATE) 0.05 % Apply topically 2 (two) times daily. 60 g 2  . desonide (DESOWEN) 0.05 % cream Apply topically 2 (two) times daily. (Patient taking differently: Apply topically 2 (two) times daily. FACE AND PRIVATE AREA) 60 g 2  . divalproex (DEPAKOTE) 250 MG DR tablet TAKE 1 TABLET (250 MG TOTAL) BY MOUTH AT BEDTIME. 30 tablet 5  . fenofibrate (TRICOR) 48 MG tablet Take 48 mg by mouth daily.    Marland Kitchen gabapentin (NEURONTIN) 300 MG capsule TAKE 1 CAPSULE TWICE A DAY 180 capsule 1  . gemfibrozil (LOPID) 600 MG tablet Take 1 tablet (600 mg total) by mouth daily. (Patient taking differently: Take 600 mg by  mouth daily with lunch. ) 30 tablet 5  . hydrochlorothiazide (MICROZIDE) 12.5 MG capsule Take 1 capsule (12.5 mg total) by mouth daily. 90 capsule 1  . HYDROcodone-acetaminophen (NORCO) 7.5-325 MG tablet Take 1 tablet by mouth 2 (two) times daily. 60 tablet 0  . loperamide (IMODIUM) 2 MG capsule Take 2 capsules to start then take 1 capsule every 2 hours until diarrhea free for 12 hours.    . metoprolol (LOPRESSOR) 50 MG tablet TAKE 1 TABLET (50 MG TOTAL) BY MOUTH 2 (TWO) TIMES DAILY. 180 tablet 1  . omeprazole (PRILOSEC) 40 MG capsule TAKE 1 CAPSULE EVERY DAY 30 capsule 5  . ondansetron (ZOFRAN) 8 MG tablet Take 8 mg by mouth.    . prochlorperazine (COMPAZINE) 10 MG tablet Take 10 mg by mouth.    Marland Kitchen tiZANidine (ZANAFLEX) 4 MG tablet TAKE 1 TABLET (4 MG TOTAL) BY MOUTH 3 (THREE) TIMES DAILY. 90 tablet 2  .  venlafaxine XR (EFFEXOR-XR) 150 MG 24 hr capsule Take 1 capsule (150 mg total) by mouth daily with breakfast. 30 capsule 5   No current facility-administered medications for this visit.     Review of Systems Review of Systems  Constitutional: Negative.   Respiratory: Negative.   Cardiovascular: Negative.   Gastrointestinal: Negative.     Blood pressure 128/78, pulse 76, resp. rate 12, height 5' 4"  (1.626 m), weight 171 lb (77.6 kg), last menstrual period 03/04/2002.  Physical Exam Physical Exam  Constitutional: She is oriented to person, place, and time. She appears well-developed and well-nourished.  Eyes: Conjunctivae are normal. No scleral icterus.  Neck: Neck supple.  Cardiovascular: Normal rate, regular rhythm and normal heart sounds.   Pulmonary/Chest: Effort normal and breath sounds normal.  Abdominal: Soft. Normal appearance and bowel sounds are normal.  Genitourinary:    Lymphadenopathy:    She has no cervical adenopathy.    She has no axillary adenopathy.       Right: No inguinal adenopathy present.       Left: No inguinal adenopathy present.  Neurological: She is alert and oriented to person, place, and time.  Skin: Skin is warm and dry.  Psychiatric: She has a normal mood and affect.    Data Reviewed Previous note Note from [facility] Oncology  Assessment    Anal canal cancer post chemotherapy and radiation treatment. No evidence of residual disease.     Plan    Follow-up in 3 months.    This has been scribed by Lesly Rubenstein LPN    Christene Lye 05/31/2016, 4:27 PM

## 2016-06-01 ENCOUNTER — Encounter: Payer: Self-pay | Admitting: Family Medicine

## 2016-06-01 ENCOUNTER — Ambulatory Visit (INDEPENDENT_AMBULATORY_CARE_PROVIDER_SITE_OTHER): Payer: 59 | Admitting: Family Medicine

## 2016-06-01 VITALS — BP 120/77 | HR 95 | Temp 98.1°F | Resp 16 | Ht 66.0 in | Wt 171.1 lb

## 2016-06-01 DIAGNOSIS — Z09 Encounter for follow-up examination after completed treatment for conditions other than malignant neoplasm: Secondary | ICD-10-CM

## 2016-06-01 DIAGNOSIS — R197 Diarrhea, unspecified: Secondary | ICD-10-CM

## 2016-06-01 DIAGNOSIS — C210 Malignant neoplasm of anus, unspecified: Secondary | ICD-10-CM

## 2016-06-01 DIAGNOSIS — R9431 Abnormal electrocardiogram [ECG] [EKG]: Secondary | ICD-10-CM | POA: Diagnosis not present

## 2016-06-01 DIAGNOSIS — R55 Syncope and collapse: Secondary | ICD-10-CM | POA: Diagnosis not present

## 2016-06-01 DIAGNOSIS — R918 Other nonspecific abnormal finding of lung field: Secondary | ICD-10-CM

## 2016-06-01 DIAGNOSIS — R1110 Vomiting, unspecified: Secondary | ICD-10-CM

## 2016-06-01 DIAGNOSIS — R0902 Hypoxemia: Secondary | ICD-10-CM | POA: Diagnosis not present

## 2016-06-01 NOTE — Progress Notes (Signed)
Name: Judy Allen   MRN: [id number]    DOB: [birth year]   Date:06/01/2016       Progress Note  Subjective  Chief Complaint  Chief Complaint  Patient presents with  . Hospitalization Follow-up    [facility] 2 weeks ago, dehydration     HPI  Hospital follow up: she had a near syncope at home on 05/18/2016 secondary to multiple days of vomiting and diarrhea. She was found to be in AKI and had prolonged QT prolongation on EKG ( likely from medication for vomiting and diarrhea). Symptoms were secondary to chemo and radiation therapy for anal cancer. During hospital stay her pulse ox remained low and she was discharge home on nasal cannula oxygen ( 2 liters ). She is off hctz and bp is at goal .   Patient Active Problem List   Diagnosis Date Noted  . ASCUS favor benign 04/14/2016  . Anal squamous cell carcinoma ([place]) 03/09/2016  . Metastatic squamous cell carcinoma ([place]) 02/25/2016  . Non-thrombocytopenic purpura ([place]) 01/12/2016  . Chronic constipation 10/16/2015  . History of fusion of cervical spine 04/02/2015  . Benign hypertension 01/16/2015  . Chronic cervical pain 01/16/2015  . CN (constipation) 01/16/2015  . Gastric reflux 01/16/2015  . Hypertriglyceridemia 01/16/2015  . Edema leg 01/16/2015  . Chronic recurrent major depressive disorder ([place]) 01/16/2015  . Dysmetabolic syndrome [id number]  . Obstructive apnea 01/16/2015  . Psoriasis 01/16/2015  . Allergic rhinitis 01/16/2015  . Bursitis, trochanteric 01/16/2015  . GAD (generalized anxiety disorder) 01/16/2015  . Tachycardia, paroxysmal [facility])     Past Surgical History:  Procedure Laterality Date  . ANTERIOR FUSION LUMBAR SPINE    . CARDIAC CATHETERIZATION  2009?    [facility]  . CARDIAC CATHETERIZATION  05/2012   [facility]: Mild hypertension 37/12 with a mean pressure of 21 mm mercury  . COLONOSCOPY  2008  . herniated disc repair    . MASS EXCISION Left 02/23/2016   Procedure: EXCISION MASS;  Surgeon: Christene Lye,  MD;  Location: [facility] ORS;  Service: General;  Laterality: Left;  . NECK SURGERY    . PORTACATH PLACEMENT Left 03/12/2016   Procedure: INSERTION PORT-A-CATH;  Surgeon: Christene Lye, MD;  Location: [facility] ORS;  Service: General;  Laterality: Left;  . tonsillectomy  1959   history  . TUBAL LIGATION     s/p    Family History  Problem Relation Age of Onset  . Heart attack Mother   . Breast cancer Maternal Aunt     Social History   Social History  . Marital status: Married    Spouse name: N/A  . Number of children: N/A  . Years of education: N/A   Occupational History  . Not on file.   Social History Main Topics  . Smoking status: Former Smoker    Packs/day: 1.00    Years: 32.00    Types: Cigarettes    Start date: 08/10/1963    Quit date: 01/15/2006  . Smokeless tobacco: Never Used  . Alcohol use No  . Drug use: No  . Sexual activity: No   Other Topics Concern  . Not on file   Social History Narrative  . No narrative on file     Current Outpatient Prescriptions:  .  ALPRAZolam (XANAX XR) 1 MG 24 hr tablet, Take 1 tablet (1 mg total) by mouth every morning., Disp: 30 tablet, Rfl: 2 .  clobetasol cream (TEMOVATE) 0.05 %, Apply topically 2 (two) times daily., Disp: 60 g,  Rfl: 2 .  desonide (DESOWEN) 0.05 % cream, Apply topically 2 (two) times daily. (Patient taking differently: Apply topically 2 (two) times daily. FACE AND PRIVATE AREA), Disp: 60 g, Rfl: 2 .  divalproex (DEPAKOTE) 250 MG DR tablet, TAKE 1 TABLET (250 MG TOTAL) BY MOUTH AT BEDTIME., Disp: 30 tablet, Rfl: 5 .  fenofibrate (TRICOR) 48 MG tablet, Take 48 mg by mouth daily., Disp: , Rfl:  .  gabapentin (NEURONTIN) 300 MG capsule, TAKE 1 CAPSULE TWICE A DAY, Disp: 180 capsule, Rfl: 1 .  HYDROcodone-acetaminophen (NORCO) 7.5-325 MG tablet, Take 1 tablet by mouth 2 (two) times daily., Disp: 60 tablet, Rfl: 0 .  metoprolol (LOPRESSOR) 50 MG tablet, TAKE 1 TABLET (50 MG TOTAL) BY MOUTH 2 (TWO) TIMES DAILY., Disp:  180 tablet, Rfl: 1 .  omeprazole (PRILOSEC) 40 MG capsule, TAKE 1 CAPSULE EVERY DAY, Disp: 30 capsule, Rfl: 5 .  prochlorperazine (COMPAZINE) 10 MG tablet, Take 10 mg by mouth., Disp: , Rfl:  .  tiZANidine (ZANAFLEX) 4 MG tablet, TAKE 1 TABLET (4 MG TOTAL) BY MOUTH 3 (THREE) TIMES DAILY., Disp: 90 tablet, Rfl: 2 .  venlafaxine XR (EFFEXOR-XR) 150 MG 24 hr capsule, Take 1 capsule (150 mg total) by mouth daily with breakfast., Disp: 30 capsule, Rfl: 5  Allergies  Allergen Reactions  . Sulfa Antibiotics Rash     ROS  Constitutional: Negative for fever , positive for weight change.  Respiratory: Negative for cough, positive for  shortness of breath.   Cardiovascular: Negative for chest pain or palpitations.  Gastrointestinal: Negative for abdominal pain, no bowel changes ( diarrhea has improved since last week).  Musculoskeletal: Negative for gait problem or joint swelling.  Skin: Negative for rash.  Neurological: Negative for dizziness or headache.  No other specific complaints in a complete review of systems (except as listed in HPI above).  Objective  Vitals:   06/01/16 1424  BP: 120/77  Pulse: 95  Resp: 16  Temp: 98.1 F (36.7 C)  TempSrc: Oral  SpO2: 98%  Weight: 171 lb 1.6 oz (77.6 kg)  Height: 5\' 6"  (1.676 m)    Body mass index is 27.62 kg/m.  Physical Exam  Constitutional: Patient appears well-developed and well-nourished. Obese No distress. On nasal cannula oxygen HEENT: head atraumatic, normocephalic, pupils equal and reactive to light, neck supple, throat within normal limits Cardiovascular: Normal rate, regular rhythm and normal heart sounds.  No murmur heard. No BLE edema. Pulmonary/Chest: Effort normal and breath sounds normal. No respiratory distress. Abdominal: Soft.  There is no tenderness. Psychiatric: Patient has a normal mood and affect. behavior is normal. Judgment and thought content normal.  Recent Results (from the past 2160 hour(s))  Glucose,  capillary     Status: Abnormal   Collection Time: 03/04/16  8:16 AM  Result Value Ref Range   Glucose-Capillary 101 (H) 65 - 99 mg/dL  Lipid panel     Status: Abnormal   Collection Time: 04/08/16 11:52 AM  Result Value Ref Range   Cholesterol 147 125 - 200 mg/dL   Triglycerides 286 (H) <150 mg/dL   HDL 46 >=46 mg/dL   Total CHOL/HDL Ratio 3.2 <=5.0 Ratio   VLDL 57 (H) <30 mg/dL   LDL Cholesterol 44 <130 mg/dL    Comment:   Total Cholesterol/HDL Ratio:CHD Risk                        Coronary Heart Disease Risk Table  Men       Women          1/2 Average Risk              3.4        3.3              Average Risk              5.0        4.4           2X Average Risk              9.6        7.1           3X Average Risk             23.4       11.0 Use the calculated Patient Ratio above and the CHD Risk table  to determine the patient's CHD Risk.   Hemoglobin A1c     Status: Abnormal   Collection Time: 04/08/16 11:52 AM  Result Value Ref Range   Hgb A1c MFr Bld 6.0 (H) <5.7 %    Comment:   For someone without known diabetes, a hemoglobin A1c value between 5.7% and 6.4% is consistent with prediabetes and should be confirmed with a follow-up test.   For someone with known diabetes, a value <7% indicates that their diabetes is well controlled. A1c targets should be individualized based on duration of diabetes, age, co-morbid conditions and other considerations.   This assay result is consistent with an increased risk of diabetes.   Currently, no consensus exists regarding use of hemoglobin A1c for diagnosis of diabetes in children.      Mean Plasma Glucose 126 mg/dL  Pap IG, CT/NG NAA, and HPV (high risk)     Status: Abnormal   Collection Time: 04/08/16 11:53 AM  Result Value Ref Range   HPV DNA High Risk Not Detected     Comment: HIGH RISK HPV types (16,18,31,33,35,39,45,51,52,56,58,59,66,68) were not detected. Other HPV types which  cause anogenital lesions may be present. The significance of the other types of HPV in malignant  processes has not been established.                  ** Normal Reference Range: Not Detected **      HPV High Risk testing performed using the APTIMA HPV mRNA Assay.      Specimen adequacy:      Comment: SATISFACTORY.  Endocervical/transformation zone component present. The specimen is partially obscured by inflammation.    FINAL DIAGNOSIS: (A)     Comment: - EPITHELIAL CELL ABNORMALITY:  ATYPICAL SQUAMOUS CELLS OF UNDETERMINED SIGNIFICANCE (ASC-US).    COMMENTS:      Comment: This Pap test has been evaluated with computer assisted technology. Your office was contacted but essential clinical information remains missing from this report at time of sign out. Please follow up with our department if you want this clinical information included.    RECOMMENDATIONS:      Comment: FOLLOW-UP is suggested as clinically indicated. For a more comprehensive discussion of these recommendations, please refer to the [email or web address] website.    Cytotechnologist:      Comment: KS, CT(HEW)   Pathologist:      Comment: Reviewed by S. Jhonnie Garner, MD, (Electronic Signature on File)   Chlamydia Probe Amp NOT DETECTED     Comment:                    **  Normal Reference Range: NOT DETECTED**   This test was performed using the APTIMA COMBO2 Assay ([place].).   The analytical performance characteristics of this assay, when used to test SurePath specimens have been determined by [facility]      GC Probe Amp NOT DETECTED     Comment:                    **Normal Reference Range: NOT DETECTED**   This test was performed using the APTIMA COMBO2 Assay ([place]   The analytical performance characteristics of this assay, when used to test SurePath specimens have been determined by [employer]   *  The Pap is a screening test for cervical cancer. It is not a  diagnostic test  and is subject to false negative and false positive  results. It is most reliable when a satisfactory sample, regularly  obtained, is submitted with relevant clinical findings and history,  and when the Pap result is evaluated along with historic and current  clinical information.       PHQ2/9: Depression screen [facility] 2/9 06/01/2016 04/08/2016 01/12/2016 11/03/2015 10/16/2015  Decreased Interest 1 1 0 0 3  Down, Depressed, Hopeless 3 2 0 0 3  PHQ - 2 Score 4 3 0 0 6  Altered sleeping 0 3 - - 3  Tired, decreased energy 3 3 - - 3  Change in appetite 3 0 - - 0  Feeling bad or failure about yourself  0 3 - - 0  Trouble concentrating 0 0 - - 0  Moving slowly or fidgety/restless 0 3 - - 0  Suicidal thoughts 0 0 - - 0  PHQ-9 Score 10 15 - - 12  Difficult doing work/chores Somewhat difficult Somewhat difficult - - Not difficult at all     Fall Risk: Fall Risk  06/01/2016 04/08/2016 01/12/2016 11/03/2015 10/16/2015  Falls in the past year? Yes No No No No  Number falls in past yr: 1 - - - -  Injury with Fall? No - - - -  Follow up Falls evaluation completed - - - -      Functional Status Survey: Is the patient deaf or have difficulty hearing?: No Does the patient have difficulty seeing, even when wearing glasses/contacts?: No Does the patient have difficulty concentrating, remembering, or making decisions?: No Does the patient have difficulty walking or climbing stairs?: No Does the patient have difficulty dressing or bathing?: No Does the patient have difficulty doing errands alone such as visiting a doctor's office or shopping?: No    Assessment & Plan  1. Hospital discharge follow-up  Stay off Zofran and lorepamine  She received pain medication from [facility] , discussed pain contract with her today and explained and she will sign a new one, not allowed to get any form of pain medication from any other provider  2. Near syncope  Resolved   3. Vomiting and diarrhea  Vomiting has  resolved, mild loose stools now  4. Hypoxemia  - Ambulatory referral to Pulmonology  5. Ground glass opacity present on imaging of lung  - Ambulatory referral to Pulmonology  6. QT prolongation  Likely from medications, and it has been stopped, discussed referral to cardiologist but she would like to hold off for now Discussed repeat EKG today, but she would like to hold off  7. Anal squamous cell carcinoma ([place])  Seen by Dr. Jamal Collin yesterday and has follow up with oncologist in December

## 2016-06-02 ENCOUNTER — Telehealth: Payer: Self-pay

## 2016-06-02 NOTE — Telephone Encounter (Signed)
I contacted [facility] to see if this patient could get a portable oxygen tank and she (Judy Allen) stated that they already have the order, so we do not need to do anything. She stated that she will forward the order to [place] with an email so that he could get in contact with the patient.

## 2016-06-08 ENCOUNTER — Encounter: Payer: Self-pay | Admitting: Family Medicine

## 2016-06-11 ENCOUNTER — Telehealth: Payer: Self-pay | Admitting: Family Medicine

## 2016-06-11 ENCOUNTER — Other Ambulatory Visit: Payer: Self-pay | Admitting: Family Medicine

## 2016-06-11 MED ORDER — FENOFIBRATE 48 MG PO TABS: 48.0000 mg | ORAL_TABLET | Freq: Every day | ORAL | 0 refills | Status: DC

## 2016-06-11 NOTE — Telephone Encounter (Signed)
LMOM to inform pt

## 2016-06-11 NOTE — Telephone Encounter (Signed)
Pt needs refill on new cholesterol meds that [facility] gave her and she is about to run out. Fenofibrate 54 mg tabs to be called into [facility]

## 2016-06-11 NOTE — Telephone Encounter (Signed)
done

## 2016-06-22 ENCOUNTER — Encounter: Payer: Self-pay | Admitting: Internal Medicine

## 2016-06-22 ENCOUNTER — Ambulatory Visit (INDEPENDENT_AMBULATORY_CARE_PROVIDER_SITE_OTHER): Payer: Self-pay | Admitting: Internal Medicine

## 2016-06-22 VITALS — BP 130/88 | HR 100 | Ht 64.0 in | Wt 174.0 lb

## 2016-06-22 DIAGNOSIS — J449 Chronic obstructive pulmonary disease, unspecified: Secondary | ICD-10-CM

## 2016-06-22 MED ORDER — GLYCOPYRROLATE-FORMOTEROL 9-4.8 MCG/ACT IN AERO: 2.0000 | INHALATION_SPRAY | Freq: Two times a day (BID) | RESPIRATORY_TRACT | 5 refills | Status: DC

## 2016-06-22 NOTE — Patient Instructions (Signed)
Start Bevespi ALbuterol as needed Check ambulating pulse ox Check ONO   Chronic Obstructive Pulmonary Disease Chronic obstructive pulmonary disease (COPD) is a common lung problem. In COPD, the flow of air from the lungs is limited. The way your lungs work will probably never return to normal, but there are things you can do to improve your lungs and make yourself feel better. Your doctor may treat your condition with:  Medicines.  Oxygen.  Lung surgery.  Changes to your diet.  Rehabilitation. This may involve a team of specialists. Follow these instructions at home:  Take all medicines as told by your doctor.  Avoid medicines or cough syrups that dry up your airway (such as antihistamines) and do not allow you to get rid of thick spit. You do not need to avoid them if told differently by your doctor.  If you smoke, stop. Smoking makes the problem worse.  Avoid being around things that make your breathing worse (like smoke, chemicals, and fumes).  Use oxygen therapy and therapy to help improve your lungs (pulmonary rehabilitation) if told by your doctor. If you need home oxygen therapy, ask your doctor if you should buy a tool to measure your oxygen level (oximeter).  Avoid people who have a sickness you can catch (contagious).  Avoid going outside when it is very hot, cold, or humid.  Eat healthy foods. Eat smaller meals more often. Rest before meals.  Stay active, but remember to also rest.  Make sure to get all the shots (vaccines) your doctor recommends. Ask your doctor if you need a pneumonia shot.  Learn and use tips on how to relax.  Learn and use tips on how to control your breathing as told by your doctor. Try: 1. Breathing in (inhaling) through your nose for 1 second. Then, pucker your lips and breath out (exhale) through your lips for 2 seconds. 2. Putting one hand on your belly (abdomen). Breathe in slowly through your nose for 1 second. Your hand on your belly  should move out. Pucker your lips and breathe out slowly through your lips. Your hand on your belly should move in as you breathe out.  Learn and use controlled coughing to clear thick spit from your lungs. The steps are: 1. Lean your head a little forward. 2. Breathe in deeply. 3. Try to hold your breath for 3 seconds. 4. Keep your mouth slightly open while coughing 2 times. 5. Spit any thick spit out into a tissue. 6. Rest and do the steps again 1 or 2 times as needed. Contact a doctor if:  You cough up more thick spit than usual.  There is a change in the color or thickness of the spit.  It is harder to breathe than usual.  Your breathing is faster than usual. Get help right away if:  You have shortness of breath while resting.  You have shortness of breath that stops you from:  Being able to talk.  Doing normal activities.  You chest hurts for longer than 5 minutes.  Your skin color is more blue than usual.  Your pulse oximeter shows that you have low oxygen for longer than 5 minutes. This information is not intended to replace advice given to you by your health care provider. Make sure you discuss any questions you have with your health care provider. Document Released: 01/12/2008 Document Revised: 01/01/2016 Document Reviewed: 03/22/2013 Elsevier Interactive Patient Education  2017 [employer].

## 2016-06-22 NOTE — Progress Notes (Signed)
[facility] Consultation      Date: 06/22/2016,   MRN# [id number] Judy Allen July 31, 1956 Code Status:  Code Status History    This patient does not have a recorded code status. Please follow your organizational policy for patients in this situation.     Hosp day:@LENGTHOFSTAYDAYS @ Referring MD: @ATDPROV @     PCP:      AdmissionWeight: 174 lb (78.9 kg)                 CurrentWeight: 174 lb (78.9 kg) Judy Allen is a 60 y.o. old female seen in consultation for SOB at the request of Dr. Ancil Boozer.     CHIEF COMPLAINT:   SOB   HISTORY OF PRESENT ILLNESS   60 yo pleasant white female seen today for her COPD She has been dx with COPD for many years She is former smoker 1 ppd for 30 years quit 9 years ago  She has been on oxygen for approx 1 month when she was admitted at [facility] for dehydration and low BP-at that time she was found to be hypoxic Patient has a dx of anal cancer 02/2016 and is on Chemo and RXT and got sick from last chemo therapy   She has been dx with COPD but does not take any inhalers at this time Patient has chronic SOB and DOE for many years She can walk 20 feet before she gets SOB She has intermittent wheezing but no cough No fevers, chills, NVD at this time Has some weight loss   She has intermittent lower ext edema No signs on infection at this time   Office Spiro today 06/22/16 Ratio 71% Fev1 1.7L  68% predicted, FVC 2.4L 75%predicted Fvc 2.4 L 75%  PAST MEDICAL HISTORY   Past Medical History:  Diagnosis Date  . Anemia    H/O  . Anxiety   . Arthritis   . Complication of anesthesia   . Constipation   . Dysrhythmia    TACHYCARDIA-WELL CONTROLLED ON METOPROLO  . GERD (gastroesophageal reflux disease)   . H/O allergic rhinitis   . Heart murmur   . Hemorrhoid   . Hyperlipidemia   . Hypertension   . Neck pain, chronic   . Neuritis   . Ovarian failure   . PONV (postoperative nausea and vomiting)   . Proteinuria     . Psoriasis   . Tachycardia, paroxysmal ([place])   . Urinary incontinence      SURGICAL HISTORY   Past Surgical History:  Procedure Laterality Date  . ANTERIOR FUSION LUMBAR SPINE    . CARDIAC CATHETERIZATION  2009?    [facility]  . CARDIAC CATHETERIZATION  05/2012   [facility]: Mild hypertension 37/12 with a mean pressure of 21 mm mercury  . COLONOSCOPY  2008  . herniated disc repair    . MASS EXCISION Left 02/23/2016   Procedure: EXCISION MASS;  Surgeon: Christene Lye, MD;  Location: [facility] ORS;  Service: General;  Laterality: Left;  . NECK SURGERY    . PORTACATH PLACEMENT Left 03/12/2016   Procedure: INSERTION PORT-A-CATH;  Surgeon: Christene Lye, MD;  Location: [facility] ORS;  Service: General;  Laterality: Left;  . tonsillectomy  1959   history  . TUBAL LIGATION     s/p     FAMILY HISTORY   Family History  Problem Relation Age of Onset  . Heart attack Mother   . Breast cancer Maternal Aunt      SOCIAL HISTORY  Social History  Substance Use Topics  . Smoking status: Former Smoker    Packs/day: 1.00    Years: 32.00    Types: Cigarettes    Start date: 08/10/1963    Quit date: 01/15/2006  . Smokeless tobacco: Never Used  . Alcohol use No     MEDICATIONS    Home Medication:  Current Outpatient Rx  . Order #: [id number] Class: Print  . Order #: [id number] Class: Normal  . Order #: [id number] Class: Normal  . Order #: [id number] Class: Normal  . Order #: [id number] Class: Normal  . Order #: [id number] Class: Normal  . Order #: [id number] Class: Print  . Order #: [id number] Class: Normal  . Order #: [id number] Class: Normal  . Order #: [id number] Class: Normal  . Order #: [id number] Class: Normal    Current Medication:  Current Outpatient Prescriptions:  .  ALPRAZolam (XANAX XR) 1 MG 24 hr tablet, Take 1 tablet (1 mg total) by mouth every morning., Disp: 30 tablet, Rfl: 2 .  clobetasol cream (TEMOVATE) 0.05 %, Apply topically 2 (two) times daily., Disp: 60 g, Rfl: 2 .   desonide (DESOWEN) 0.05 % cream, Apply topically 2 (two) times daily. (Patient taking differently: Apply topically 2 (two) times daily. FACE AND PRIVATE AREA), Disp: 60 g, Rfl: 2 .  divalproex (DEPAKOTE) 250 MG DR tablet, TAKE 1 TABLET (250 MG TOTAL) BY MOUTH AT BEDTIME., Disp: 30 tablet, Rfl: 5 .  fenofibrate (TRICOR) 48 MG tablet, Take 1 tablet (48 mg total) by mouth daily., Disp: 90 tablet, Rfl: 0 .  gabapentin (NEURONTIN) 300 MG capsule, TAKE 1 CAPSULE TWICE A DAY, Disp: 180 capsule, Rfl: 1 .  HYDROcodone-acetaminophen (NORCO) 7.5-325 MG tablet, Take 1 tablet by mouth 2 (two) times daily., Disp: 60 tablet, Rfl: 0 .  metoprolol (LOPRESSOR) 50 MG tablet, TAKE 1 TABLET (50 MG TOTAL) BY MOUTH 2 (TWO) TIMES DAILY., Disp: 180 tablet, Rfl: 1 .  omeprazole (PRILOSEC) 40 MG capsule, TAKE 1 CAPSULE EVERY DAY, Disp: 30 capsule, Rfl: 5 .  tiZANidine (ZANAFLEX) 4 MG tablet, TAKE 1 TABLET (4 MG TOTAL) BY MOUTH 3 (THREE) TIMES DAILY., Disp: 90 tablet, Rfl: 2 .  venlafaxine XR (EFFEXOR-XR) 150 MG 24 hr capsule, Take 1 capsule (150 mg total) by mouth daily with breakfast., Disp: 30 capsule, Rfl: 5    ALLERGIES   Sulfa antibiotics     REVIEW OF SYSTEMS   Review of Systems  Constitutional: Negative for chills, diaphoresis, fever, malaise/fatigue and weight loss.  HENT: Negative for congestion and hearing loss.   Eyes: Negative for blurred vision and double vision.  Respiratory: Positive for shortness of breath. Negative for cough, hemoptysis and wheezing.   Cardiovascular: Negative for chest pain, palpitations, orthopnea and leg swelling.  Gastrointestinal: Negative for abdominal pain, heartburn, nausea and vomiting.  Genitourinary: Negative for dysuria and urgency.  Musculoskeletal: Negative for back pain, myalgias and neck pain.  Skin: Negative for rash.  Neurological: Negative for dizziness and weakness.  Endo/Heme/Allergies: Does not bruise/bleed easily.  Psychiatric/Behavioral: Negative for  depression.  All other systems reviewed and are negative.    VS: BP 130/88 (BP Location: Left Arm, Cuff Size: Normal)   Pulse 100   Ht 5' 4"  (1.626 m)   Wt 174 lb (78.9 kg)   LMP 03/04/2002 (Approximate)   SpO2 96%   BMI 29.87 kg/m      PHYSICAL EXAM  Physical Exam  Constitutional: She is oriented to person, place, and time. She appears well-developed and well-nourished. No distress.  HENT:  Head: Normocephalic and  atraumatic.  Mouth/Throat: No oropharyngeal exudate.  Eyes: EOM are normal. Pupils are equal, round, and reactive to light. No scleral icterus.  Neck: Normal range of motion. Neck supple.  Cardiovascular: Normal rate, regular rhythm and normal heart sounds.   No murmur heard. Pulmonary/Chest: No stridor. No respiratory distress. She has no wheezes.  Abdominal: Soft. Bowel sounds are normal.  Musculoskeletal: Normal range of motion. She exhibits no edema.  Neurological: She is alert and oriented to person, place, and time. No cranial nerve deficit.  Skin: Skin is warm. She is not diaphoretic.  Psychiatric: She has a normal mood and affect.       Office   IMAGING   CT chest 05/2012 reports upper lobe emphysema but no images can be viewed at this time    ASSESSMENT/PLAN    60 yo pleasant white female seen today to establish care for her Moderate COPD Gold Stage B  1.start Bevespi 2.albuterol as needed 3.check ambulating pulse ox to assess oxygen needs 4.check ONO to assess oxygen needs   Follow up in 3 months to assess resp status  I have personally obtained a history, examined the patient, evaluated laboratory and independently reviewed imaging results, formulated the assessment and plan and placed orders.  The Patient requires high complexity decision making for assessment and support, frequent evaluation and titration of therapies, application of advanced monitoring technologies and extensive interpretation of multiple databases.    Patient  satisfied with Plan of action and management. All questions answered  Corrin Parker, M.D.  [facility]  Medical Director [place] Director [facility] Cardio-Pulmonary Department

## 2016-07-05 ENCOUNTER — Other Ambulatory Visit: Payer: Self-pay | Admitting: Family Medicine

## 2016-07-05 NOTE — Telephone Encounter (Signed)
Patient stated that [facility] took her off both of the following medications  Metoprolol 41m twice daily and the hospital drop patient down to 252mtablets taking twice daily.  Patient stated that she would like to go back to taking the 50 mg twice a day because it made her feel better.  Hydrochlorothiazide 12.5 mg.  Patient stated she would like to go back on this a well due to left foot swelling again.

## 2016-07-06 ENCOUNTER — Other Ambulatory Visit: Payer: Self-pay | Admitting: Family Medicine

## 2016-07-06 MED ORDER — METOPROLOL SUCCINATE ER 25 MG PO TB24: 25.0000 mg | ORAL_TABLET | Freq: Every day | ORAL | 0 refills | Status: DC

## 2016-07-06 NOTE — Telephone Encounter (Signed)
Please update he chart. Thank you

## 2016-07-06 NOTE — Telephone Encounter (Signed)
Read notes form ED visit in Oct and D/C metoprolol 50 and added metoprolol succinate 25 mg to her med list. Did not see HCTZ on her med list but did see where she was told to D/C it due to hypokalemic

## 2016-07-06 NOTE — Telephone Encounter (Signed)
Patient requesting refill of Gabapentin to [facility]. 

## 2016-07-07 ENCOUNTER — Telehealth: Payer: Self-pay

## 2016-07-07 NOTE — Telephone Encounter (Signed)
Since leaving [facility] and them taking her off two of her medications she has been experiencing heart palpations and SOB even on oxygen. Patient wanted to know if you could put her back on hydrochlorothiazide 12.5 mg and metoprolol 50 mg bid daily due to her bp staying alone. Patient states she her BP is running 131/93 and heart rate is 114. Please call her back at [phone].

## 2016-07-07 NOTE — Telephone Encounter (Signed)
She can resume metoprolol 25 mg ( half dose ) twice daily and monitor, do not start HCTZ

## 2016-07-08 NOTE — Telephone Encounter (Signed)
PT CALLED AND MIEL SAID THAT I COULD GIVE THE PATIENT THE DR MESSAGE SINCE IT WAS DOCUMENTED AND THE PATIENT SAID OK AND UNDERSTOOD THE MESSAGE.

## 2016-07-13 ENCOUNTER — Ambulatory Visit: Payer: 59 | Admitting: Family Medicine

## 2016-07-14 ENCOUNTER — Encounter: Payer: Self-pay | Admitting: Family Medicine

## 2016-07-14 ENCOUNTER — Ambulatory Visit (INDEPENDENT_AMBULATORY_CARE_PROVIDER_SITE_OTHER): Payer: Self-pay | Admitting: Family Medicine

## 2016-07-14 VITALS — BP 134/82 | HR 91 | Temp 97.8°F | Resp 20 | Ht 64.0 in | Wt 173.8 lb

## 2016-07-14 DIAGNOSIS — E781 Pure hyperglyceridemia: Secondary | ICD-10-CM

## 2016-07-14 DIAGNOSIS — F411 Generalized anxiety disorder: Secondary | ICD-10-CM

## 2016-07-14 DIAGNOSIS — I10 Essential (primary) hypertension: Secondary | ICD-10-CM

## 2016-07-14 DIAGNOSIS — G8929 Other chronic pain: Secondary | ICD-10-CM

## 2016-07-14 DIAGNOSIS — C799 Secondary malignant neoplasm of unspecified site: Secondary | ICD-10-CM

## 2016-07-14 DIAGNOSIS — IMO0002 Reserved for concepts with insufficient information to code with codable children: Secondary | ICD-10-CM

## 2016-07-14 DIAGNOSIS — I479 Paroxysmal tachycardia, unspecified: Secondary | ICD-10-CM

## 2016-07-14 DIAGNOSIS — F339 Major depressive disorder, recurrent, unspecified: Secondary | ICD-10-CM

## 2016-07-14 DIAGNOSIS — E8881 Metabolic syndrome: Secondary | ICD-10-CM

## 2016-07-14 DIAGNOSIS — C801 Malignant (primary) neoplasm, unspecified: Secondary | ICD-10-CM

## 2016-07-14 DIAGNOSIS — M542 Cervicalgia: Secondary | ICD-10-CM

## 2016-07-14 DIAGNOSIS — J432 Centrilobular emphysema: Secondary | ICD-10-CM

## 2016-07-14 DIAGNOSIS — Z9981 Dependence on supplemental oxygen: Secondary | ICD-10-CM

## 2016-07-14 MED ORDER — HYDROCODONE-ACETAMINOPHEN 5-325 MG PO TABS: 1.0000 | ORAL_TABLET | Freq: Two times a day (BID) | ORAL | 0 refills | Status: DC

## 2016-07-14 MED ORDER — ALPRAZOLAM ER 1 MG PO TB24: 1.0000 mg | ORAL_TABLET | ORAL | 2 refills | Status: DC

## 2016-07-14 MED ORDER — HYDROCODONE-ACETAMINOPHEN 5-325 MG PO TABS: 1.0000 | ORAL_TABLET | Freq: Four times a day (QID) | ORAL | 0 refills | Status: DC | PRN

## 2016-07-14 MED ORDER — METOPROLOL SUCCINATE ER 50 MG PO TB24: 50.0000 mg | ORAL_TABLET | Freq: Every day | ORAL | 2 refills | Status: DC

## 2016-07-14 MED ORDER — HYDROCHLOROTHIAZIDE 12.5 MG PO TABS: 12.5000 mg | ORAL_TABLET | Freq: Every day | ORAL | 2 refills | Status: DC

## 2016-07-14 NOTE — Progress Notes (Signed)
Name: Judy Allen   MRN: [id number]    DOB: 08-08-1956   Date:07/14/2016       Progress Note  Subjective  Chief Complaint  Chief Complaint  Patient presents with  . Medication Refill    3 month F/U  . Hyperlipidemia  . Hypertension    Changed to 25 mg bid, states she feels like she is needs to be on 50 mg bid because she is still having palpations  . Gastroesophageal Reflux    Controlled   . Depression    Controlled with medication but some days she feels useless.    HPI  HTN: while at [facility] October 2017 her bp dropped and Toprol was changed to 25 mg daily and HCTZ was stopped. BP at home has been gradually getting higher and HR also increased, we advised her to increase Metoprolol to two daily, but DBP still in the 90's at home. She is eating well now, no longer having diarrhea and we will resume HCTZ and monitor for now. No chest pain, but she has palpitation. Highest hr at home was 112 recently, but mostly around 107  Neck pain: It is stable now with mild improvement when hydrocodone was increased from 5 -7.5 during radiation therapy of anus.  she was sent back to [facility] this past Summer . She had a steroid injection injection for her facet arthropathy but there was no improvement of the pain. She was advised to try otc nsaid's and continue hydrocodone. Pain is described as sharp and throbbing pain, sometimes burning, currently a 2/10 and currently feels like an achy and burning  Pain. She states over the past couple of days has noticed some intermittent tingling down left arm, no weakness, not present at this time. Continue gabapentin  Chronic low back pain: she was seen by [facility], history of lumbar spine surgery and had injections by Dr. Arneta Cliche in the past with improvement of symptoms, no radiculitis, worse when bending forward.   GAD/Depression Major: currently on Effexor and Depakote  because Abilify and Cymbalta was too expenisve- no  longer could afford. She was doing well, but is very depressed because of cancer, COPD and using oxygen, feeling defeated and useless, no longer working or helping with the bills.  She denies suicidal thoughts or ideation,she states appetite is okay but she has anhedonia.  Family is supportive, living with daughter.   Anal Squamous cell Carcinoma: seeing oncologist and radiation oncologist at [facility], no longer has diarrhea or rectal pain. She will have PET scan and some labs soon  Emphysema: she used to smoke for many years, but quit almost 10 years ago, but was admitted to [facility] in October, had CT chest to rule out PE and was found to have centrilobular emphysema and also hypoxemia, she was sent home on 2 liters of oxygen and is now also on Bevespi. Seeing Dr. Mortimer Fries. She continues to have SOB and a productive cough all day long, no wheezing.   Dysmetabolic syndrome: avoiding sweets, weight is stable  Hypertriglyceridemia: taking tricor and denies side effects, no chest pain  Patient Active Problem List   Diagnosis Date Noted  . Centrilobular emphysema ([place]) 07/14/2016  . ASCUS favor benign 04/14/2016  . Anal squamous cell carcinoma ([place]) 03/09/2016  . Metastatic squamous cell carcinoma ([place]) 02/25/2016  . Non-thrombocytopenic purpura ([place]) 01/12/2016  . Chronic constipation 10/16/2015  . History of fusion of cervical spine 04/02/2015  . Benign hypertension 01/16/2015  . Chronic cervical pain  01/16/2015  . CN (constipation) 01/16/2015  . Gastric reflux 01/16/2015  . Hypertriglyceridemia 01/16/2015  . Edema leg 01/16/2015  . Chronic recurrent major depressive disorder ([place]) 01/16/2015  . Dysmetabolic syndrome [id number]  . Obstructive apnea 01/16/2015  . Psoriasis 01/16/2015  . Allergic rhinitis 01/16/2015  . Bursitis, trochanteric 01/16/2015  . GAD (generalized anxiety disorder) 01/16/2015  . Tachycardia, paroxysmal [facility])     Past Surgical History:  Procedure Laterality Date  .  ANTERIOR FUSION LUMBAR SPINE    . CARDIAC CATHETERIZATION  2009?    [facility]  . CARDIAC CATHETERIZATION  05/2012   [facility]: Mild hypertension 37/12 with a mean pressure of 21 mm mercury  . COLONOSCOPY  2008  . herniated disc repair    . MASS EXCISION Left 02/23/2016   Procedure: EXCISION MASS;  Surgeon: Christene Lye, MD;  Location: [facility] ORS;  Service: General;  Laterality: Left;  . NECK SURGERY    . PORTACATH PLACEMENT Left 03/12/2016   Procedure: INSERTION PORT-A-CATH;  Surgeon: Christene Lye, MD;  Location: [facility] ORS;  Service: General;  Laterality: Left;  . tonsillectomy  1959   history  . TUBAL LIGATION     s/p    Family History  Problem Relation Age of Onset  . Heart attack Mother   . Breast cancer Maternal Aunt     Social History   Social History  . Marital status: Married    Spouse name: N/A  . Number of children: N/A  . Years of education: N/A   Occupational History  . Not on file.   Social History Main Topics  . Smoking status: Former Smoker    Packs/day: 1.00    Years: 32.00    Types: Cigarettes    Start date: 08/10/1963    Quit date: 01/15/2006  . Smokeless tobacco: Never Used  . Alcohol use No  . Drug use: No  . Sexual activity: No   Other Topics Concern  . Not on file   Social History Narrative  . No narrative on file     Current Outpatient Prescriptions:  .  ALPRAZolam (XANAX XR) 1 MG 24 hr tablet, Take 1 tablet (1 mg total) by mouth every morning., Disp: 30 tablet, Rfl: 2 .  clobetasol cream (TEMOVATE) 0.05 %, Apply topically 2 (two) times daily., Disp: 60 g, Rfl: 2 .  desonide (DESOWEN) 0.05 % cream, Apply topically 2 (two) times daily. (Patient taking differently: Apply topically 2 (two) times daily. FACE AND PRIVATE AREA), Disp: 60 g, Rfl: 2 .  divalproex (DEPAKOTE) 250 MG DR tablet, TAKE 1 TABLET (250 MG TOTAL) BY MOUTH AT BEDTIME., Disp: 30 tablet, Rfl: 5 .  gabapentin (NEURONTIN) 300 MG capsule, TAKE 1 CAPSULE TWICE A DAY, Disp:  180 capsule, Rfl: 1 .  Glycopyrrolate-Formoterol (BEVESPI AEROSPHERE) 9-4.8 MCG/ACT AERO, Inhale 2 puffs into the lungs 2 (two) times daily., Disp: 10.7 g, Rfl: 5 .  metoprolol succinate (TOPROL-XL) 50 MG 24 hr tablet, Take 1 tablet (50 mg total) by mouth daily., Disp: 30 tablet, Rfl: 2 .  omeprazole (PRILOSEC) 40 MG capsule, TAKE 1 CAPSULE EVERY DAY, Disp: 30 capsule, Rfl: 5 .  tiZANidine (ZANAFLEX) 4 MG tablet, TAKE 1 TABLET (4 MG TOTAL) BY MOUTH 3 (THREE) TIMES DAILY., Disp: 90 tablet, Rfl: 2 .  venlafaxine XR (EFFEXOR-XR) 150 MG 24 hr capsule, Take 1 capsule (150 mg total) by mouth daily with breakfast., Disp: 30 capsule, Rfl: 5 .  fenofibrate (TRICOR) 48 MG tablet, Take 1 tablet (48 mg  total) by mouth daily., Disp: 90 tablet, Rfl: 0 .  hydrochlorothiazide (HYDRODIURIL) 12.5 MG tablet, Take 1 tablet (12.5 mg total) by mouth daily., Disp: 30 tablet, Rfl: 2 .  HYDROcodone-acetaminophen (NORCO/VICODIN) 5-325 MG tablet, Take 1 tablet by mouth 2 (two) times daily before a meal., Disp: 60 tablet, Rfl: 0 .  HYDROcodone-acetaminophen (NORCO/VICODIN) 5-325 MG tablet, Take 1 tablet by mouth 2 (two) times daily., Disp: 60 tablet, Rfl: 0 .  HYDROcodone-acetaminophen (NORCO/VICODIN) 5-325 MG tablet, Take 1 tablet by mouth every 6 (six) hours as needed for moderate pain., Disp: 30 tablet, Rfl: 0  Allergies  Allergen Reactions  . Sulfa Antibiotics Rash     ROS  Constitutional: Negative for fever or weight change.  Respiratory: Positive  for cough and shortness of breath.   Cardiovascular: Negative for chest pain or palpitations.  Gastrointestinal: Negative for abdominal pain, no bowel changes.  Musculoskeletal: Negative for gait problem or joint swelling.  Skin: Negative for rash.  Neurological: Negative for dizziness or headache.  No other specific complaints in a complete review of systems (except as listed in HPI above).  Objective  Vitals:   07/14/16 1019  BP: 134/82  Pulse: 91  Resp: 20   Temp: 97.8 F (36.6 C)  TempSrc: Oral  SpO2: 92%  Weight: 173 lb 12.8 oz (78.8 kg)  Height: 5\' 4"  (1.626 m)    Body mass index is 29.83 kg/m.  Physical Exam  Constitutional: Patient appears well-developed and well-nourished. Obese No distress.  HEENT: head atraumatic, normocephalic, pupils equal and reactive to light, neck supple, throat within normal limits Cardiovascular: Normal rate, regular rhythm and normal heart sounds.  No murmur heard. No BLE edema. Pulmonary/Chest: Effort normal and breath sounds normal. No respiratory distress. Abdominal: Soft.  There is no tenderness. Psychiatric: Patient has a normal mood and affect. behavior is normal. Judgment and thought content normal.   PHQ2/9: Depression screen [facility] 2/9 07/14/2016 06/01/2016 04/08/2016 01/12/2016 11/03/2015  Decreased Interest 3 1 1  0 0  Down, Depressed, Hopeless 3 3 2  0 0  PHQ - 2 Score 6 4 3  0 0  Altered sleeping 3 0 3 - -  Tired, decreased energy 3 3 3  - -  Change in appetite 0 3 0 - -  Feeling bad or failure about yourself  3 0 3 - -  Trouble concentrating 0 0 0 - -  Moving slowly or fidgety/restless 3 0 3 - -  Suicidal thoughts 0 0 0 - -  PHQ-9 Score 18 10 15  - -  Difficult doing work/chores Not difficult at all Somewhat difficult Somewhat difficult - -     Fall Risk: Fall Risk  07/14/2016 06/01/2016 04/08/2016 01/12/2016 11/03/2015  Falls in the past year? Yes Yes No No No  Number falls in past yr: 1 1 - - -  Injury with Fall? No No - - -  Follow up - Falls evaluation completed - - -    Assessment & Plan  1. Benign hypertension  - metoprolol succinate (TOPROL-XL) 50 MG 24 hr tablet; Take 1 tablet (50 mg total) by mouth daily.  Dispense: 30 tablet; Refill: 2 - hydrochlorothiazide (HYDRODIURIL) 12.5 MG tablet; Take 1 tablet (12.5 mg total) by mouth daily.  Dispense: 30 tablet; Refill: 2  2. Metastatic squamous cell carcinoma (HCC)  Feeling depressed  3. Tachycardia, paroxysmal (HCC)  -  metoprolol succinate (TOPROL-XL) 50 MG 24 hr tablet; Take 1 tablet (50 mg total) by mouth daily.  Dispense: 30 tablet; Refill: 2  4. Hypertriglyceridemia  Continue medication   5. Chronic recurrent major depressive disorder ([place])  Explained importance of therapy and mindfulness. Discussed to return sooner, but she can afford to come in sooner  6. Dysmetabolic syndrome  Continue life style modification   7. GAD (generalized anxiety disorder)  - ALPRAZolam (XANAX XR) 1 MG 24 hr tablet; Take 1 tablet (1 mg total) by mouth every morning.  Dispense: 30 tablet; Refill: 2  8. Chronic cervical pain  We will go back on 5/325 mg explained risk of respiratory suppression and death with narcotics and she already has emphysema, she agrees on changing it - HYDROcodone-acetaminophen (NORCO/VICODIN) 5-325 MG tablet; Take 1 tablet by mouth 2 (two) times daily before a meal.  Dispense: 60 tablet; Refill: 0 - HYDROcodone-acetaminophen (NORCO/VICODIN) 5-325 MG tablet; Take 1 tablet by mouth 2 (two) times daily.  Dispense: 60 tablet; Refill: 0 - HYDROcodone-acetaminophen (NORCO/VICODIN) 5-325 MG tablet; Take 1 tablet by mouth every 6 (six) hours as needed for moderate pain.  Dispense: 30 tablet; Refill: 0  9. Centrilobular emphysema ([place])  Continue follow up with Dr. Mortimer Fries  10. Oxygen dependent  On 2 liters

## 2016-07-20 DIAGNOSIS — J449 Chronic obstructive pulmonary disease, unspecified: Secondary | ICD-10-CM

## 2016-07-28 DIAGNOSIS — Z9981 Dependence on supplemental oxygen: Secondary | ICD-10-CM | POA: Insufficient documentation

## 2016-07-28 DIAGNOSIS — J9611 Chronic respiratory failure with hypoxia: Secondary | ICD-10-CM | POA: Insufficient documentation

## 2016-07-29 ENCOUNTER — Encounter: Payer: Self-pay | Admitting: *Deleted

## 2016-07-29 NOTE — Progress Notes (Signed)
Patient was contacted today to let her know that we did receive records from Dr. Drucilla Chalet [facility]- Phone: [phone])  Dr. Jamal Collin did review records and would like to see her back in 3-6 months for follow up instead of January as originally scheduled. Patient has been placed in March 2018 recalls.   This patient reports that she did have a CT chest/abdomen/pelvis done yesterday, 07-28-16. She was instructed to call her physician's office to have the report faxed to our office.   Patient also questioning if her doctor's office had contacted [place] to arrange for port removal. We have no record of this and patient was encouraged to contact her physician's office to fax order for removal.

## 2016-07-30 ENCOUNTER — Other Ambulatory Visit: Payer: Self-pay

## 2016-07-30 ENCOUNTER — Telehealth: Payer: Self-pay

## 2016-07-30 DIAGNOSIS — J432 Centrilobular emphysema: Secondary | ICD-10-CM

## 2016-07-30 NOTE — Telephone Encounter (Signed)
Pt aware to cont 2L O2 qhs. Pt aware and voiced her understanding. Nothing further needed.

## 2016-07-30 NOTE — Telephone Encounter (Signed)
Pt returning call on ONO results. Please advise Margie, I did not see the results in the computer. Thanks.

## 2016-07-30 NOTE — Telephone Encounter (Signed)
lmtcb in regards to ONO results. Will await call back

## 2016-08-09 HISTORY — PX: COLONOSCOPY: SHX174

## 2016-08-09 HISTORY — PX: PORT-A-CATH REMOVAL: SHX5289

## 2016-09-02 ENCOUNTER — Ambulatory Visit: Payer: 59 | Admitting: General Surgery

## 2016-09-07 DIAGNOSIS — D749 Methemoglobinemia, unspecified: Secondary | ICD-10-CM

## 2016-09-07 HISTORY — DX: Methemoglobinemia, unspecified: D74.9

## 2016-09-12 DIAGNOSIS — G4733 Obstructive sleep apnea (adult) (pediatric): Secondary | ICD-10-CM

## 2016-09-12 DIAGNOSIS — G4730 Sleep apnea, unspecified: Secondary | ICD-10-CM

## 2016-09-12 HISTORY — DX: Obstructive sleep apnea (adult) (pediatric): G47.33

## 2016-09-12 HISTORY — DX: Sleep apnea, unspecified: G47.30

## 2016-09-20 ENCOUNTER — Encounter: Payer: Self-pay | Admitting: Family Medicine

## 2016-09-24 ENCOUNTER — Encounter: Payer: Self-pay | Admitting: Family Medicine

## 2016-09-24 ENCOUNTER — Telehealth: Payer: Self-pay | Admitting: Family Medicine

## 2016-09-24 ENCOUNTER — Ambulatory Visit (INDEPENDENT_AMBULATORY_CARE_PROVIDER_SITE_OTHER): Payer: Self-pay | Admitting: Family Medicine

## 2016-09-24 VITALS — BP 128/76 | HR 105 | Temp 97.8°F | Resp 16 | Ht 64.0 in | Wt 190.1 lb

## 2016-09-24 DIAGNOSIS — D649 Anemia, unspecified: Secondary | ICD-10-CM

## 2016-09-24 DIAGNOSIS — Z09 Encounter for follow-up examination after completed treatment for conditions other than malignant neoplasm: Secondary | ICD-10-CM

## 2016-09-24 DIAGNOSIS — J441 Chronic obstructive pulmonary disease with (acute) exacerbation: Secondary | ICD-10-CM

## 2016-09-24 DIAGNOSIS — D749 Methemoglobinemia, unspecified: Secondary | ICD-10-CM

## 2016-09-24 DIAGNOSIS — R600 Localized edema: Secondary | ICD-10-CM

## 2016-09-24 DIAGNOSIS — R0902 Hypoxemia: Secondary | ICD-10-CM

## 2016-09-24 LAB — CBC WITH DIFFERENTIAL/PLATELET
BASOS ABS: 0 {cells}/uL (ref 0–200)
Basophils Relative: 0 %
EOS ABS: 93 {cells}/uL (ref 15–500)
Eosinophils Relative: 1 %
HEMATOCRIT: 32.4 % — AB (ref 35.0–45.0)
HEMOGLOBIN: 10.6 g/dL — AB (ref 11.7–15.5)
LYMPHS ABS: 837 {cells}/uL — AB (ref 850–3900)
LYMPHS PCT: 9 %
MCH: 31.1 pg (ref 27.0–33.0)
MCHC: 32.7 g/dL (ref 32.0–36.0)
MCV: 95 fL (ref 80.0–100.0)
MONO ABS: 651 {cells}/uL (ref 200–950)
MPV: 8.1 fL (ref 7.5–12.5)
Monocytes Relative: 7 %
NEUTROS PCT: 83 %
Neutro Abs: 7719 cells/uL (ref 1500–7800)
Platelets: 244 10*3/uL (ref 140–400)
RBC: 3.41 MIL/uL — AB (ref 3.80–5.10)
RDW: 16.2 % — AB (ref 11.0–15.0)
WBC: 9.3 10*3/uL (ref 3.8–10.8)

## 2016-09-24 LAB — COMPLETE METABOLIC PANEL WITH GFR
ALBUMIN: 4.1 g/dL (ref 3.6–5.1)
ALK PHOS: 37 U/L (ref 33–130)
ALT: 30 U/L — ABNORMAL HIGH (ref 6–29)
AST: 19 U/L (ref 10–35)
BILIRUBIN TOTAL: 0.3 mg/dL (ref 0.2–1.2)
BUN: 7 mg/dL (ref 7–25)
CO2: 28 mmol/L (ref 20–31)
Calcium: 9.3 mg/dL (ref 8.6–10.4)
Chloride: 105 mmol/L (ref 98–110)
Creat: 0.95 mg/dL (ref 0.50–0.99)
GFR, EST AFRICAN AMERICAN: 75 mL/min (ref >=60)
GFR, EST NON AFRICAN AMERICAN: 65 mL/min (ref >=60)
Glucose, Bld: 98 mg/dL (ref 65–99)
Potassium: 4.2 mmol/L (ref 3.5–5.3)
Sodium: 142 mmol/L (ref 135–146)
TOTAL PROTEIN: 6.5 g/dL (ref 6.1–8.1)

## 2016-09-24 LAB — IRON,TIBC AND FERRITIN PANEL
%SAT: 12 % (ref 11–50)
Ferritin: 80 ng/mL (ref 20–288)
Iron: 46 ug/dL (ref 45–160)
TIBC: 370 ug/dL (ref 250–450)

## 2016-09-24 MED ORDER — PREDNISONE 10 MG PO TABS: 5.0000 mg | ORAL_TABLET | Freq: Two times a day (BID) | ORAL | 0 refills | Status: DC

## 2016-09-24 NOTE — Progress Notes (Signed)
Name: Judy Allen   MRN: [id number]    DOB: 1956/03/18   Date:09/24/2016       Progress Note  Subjective  Chief Complaint  Chief Complaint  Patient presents with  . Hospitalization Follow-up    Patient went to [facility] due to low oxygen rate was around 85 or lower  . COPD  . Sleep Apnea    Performed a Sleep Study while in the hospital and put patient on CPAP machine. Sleeping well except states when taking off CPAP in the morning her O2 is down to 77 then once she put her oxygen back on goes up to [facility] follow up: she went to see pulmonologist on 09/06/2016 and was found to be very hypoxic and was sent by EMS to [facility]. While at [facility] she was given methylene blue for methemoglobinemia and cyanosis resolved, but she was admitted for COPD exacerbation. Given antibiotics, placed on higher dose of oxygen and started and higher dose of prednisone. She is down to 5 mg for the past 2 days, last night she noticed that pulse ox had dropped to 87 % and she increased oxygen to 2.5 liters, this morning after she removed CPAP her pulse ox dropped to 77%. She has noticed lower extremity edema - slightly better since Sunday but no orthopnea and cyanosis is not back. She denies a cough, no fever. Appetite is normal.   Patient Active Problem List   Diagnosis Date Noted  . Centrilobular emphysema ([place]) 07/14/2016  . ASCUS favor benign 04/14/2016  . Anal squamous cell carcinoma ([place]) 03/09/2016  . Metastatic squamous cell carcinoma ([place]) 02/25/2016  . Non-thrombocytopenic purpura ([place]) 01/12/2016  . Chronic constipation 10/16/2015  . History of fusion of cervical spine 04/02/2015  . Benign hypertension 01/16/2015  . Chronic cervical pain 01/16/2015  . CN (constipation) 01/16/2015  . Gastric reflux 01/16/2015  . Hypertriglyceridemia 01/16/2015  . Edema leg 01/16/2015  . Chronic recurrent major depressive disorder ([place]) 01/16/2015  . Dysmetabolic syndrome [id number]  . Obstructive  apnea 01/16/2015  . Psoriasis 01/16/2015  . Allergic rhinitis 01/16/2015  . Bursitis, trochanteric 01/16/2015  . GAD (generalized anxiety disorder) 01/16/2015  . Tachycardia, paroxysmal [facility])     Past Surgical History:  Procedure Laterality Date  . ANTERIOR FUSION LUMBAR SPINE    . CARDIAC CATHETERIZATION  2009?    [facility]  . CARDIAC CATHETERIZATION  05/2012   [facility]HC: Mild hypertension 37/12 with a mean pressure of 21 mm mercury  . COLONOSCOPY  2008  . herniated disc repair    . MASS EXCISION Left 02/23/2016   Procedure: EXCISION MASS;  Surgeon: Christene Lye, MD;  Location: [facility] ORS;  Service: General;  Laterality: Left;  . NECK SURGERY    . PORTACATH PLACEMENT Left 03/12/2016   Procedure: INSERTION PORT-A-CATH;  Surgeon: Christene Lye, MD;  Location: [facility] ORS;  Service: General;  Laterality: Left;  . tonsillectomy  1959   history  . TUBAL LIGATION     s/p    Family History  Problem Relation Age of Onset  . Heart attack Mother   . Breast cancer Maternal Aunt     Social History   Social History  . Marital status: Married    Spouse name: N/A  . Number of children: N/A  . Years of education: N/A   Occupational History  . Not on file.   Social History Main Topics  . Smoking status: Former Smoker    Packs/day:  1.00    Years: 32.00    Types: Cigarettes    Start date: 08/10/1963    Quit date: 01/15/2006  . Smokeless tobacco: Never Used  . Alcohol use No  . Drug use: No  . Sexual activity: No   Other Topics Concern  . Not on file   Social History Narrative  . No narrative on file     Current Outpatient Prescriptions:  .  ALPRAZolam (XANAX XR) 1 MG 24 hr tablet, Take 1 tablet (1 mg total) by mouth every morning., Disp: 30 tablet, Rfl: 2 .  clobetasol cream (TEMOVATE) 0.05 %, Apply topically 2 (two) times daily., Disp: 60 g, Rfl: 2 .  desonide (DESOWEN) 0.05 % cream, Apply topically 2 (two) times daily. (Patient taking differently: Apply topically 2  (two) times daily. FACE AND PRIVATE AREA), Disp: 60 g, Rfl: 2 .  divalproex (DEPAKOTE) 250 MG DR tablet, TAKE 1 TABLET (250 MG TOTAL) BY MOUTH AT BEDTIME., Disp: 30 tablet, Rfl: 5 .  gabapentin (NEURONTIN) 300 MG capsule, TAKE 1 CAPSULE TWICE A DAY, Disp: 180 capsule, Rfl: 1 .  hydrochlorothiazide (HYDRODIURIL) 12.5 MG tablet, Take 1 tablet (12.5 mg total) by mouth daily., Disp: 30 tablet, Rfl: 2 .  HYDROcodone-acetaminophen (NORCO/VICODIN) 5-325 MG tablet, Take 1 tablet by mouth 2 (two) times daily before a meal., Disp: 60 tablet, Rfl: 0 .  HYDROcodone-acetaminophen (NORCO/VICODIN) 5-325 MG tablet, Take 1 tablet by mouth 2 (two) times daily., Disp: 60 tablet, Rfl: 0 .  HYDROcodone-acetaminophen (NORCO/VICODIN) 5-325 MG tablet, Take 1 tablet by mouth every 6 (six) hours as needed for moderate pain., Disp: 30 tablet, Rfl: 0 .  metoprolol succinate (TOPROL-XL) 50 MG 24 hr tablet, Take 1 tablet (50 mg total) by mouth daily., Disp: 30 tablet, Rfl: 2 .  omeprazole (PRILOSEC) 40 MG capsule, TAKE 1 CAPSULE EVERY DAY, Disp: 30 capsule, Rfl: 5 .  predniSONE (DELTASONE) 10 MG tablet, Take 30 mg by mouth., Disp: , Rfl:  .  tiotropium (SPIRIVA) 18 MCG inhalation capsule, Place into inhaler and inhale., Disp: , Rfl:  .  tiZANidine (ZANAFLEX) 4 MG tablet, TAKE 1 TABLET (4 MG TOTAL) BY MOUTH 3 (THREE) TIMES DAILY., Disp: 90 tablet, Rfl: 2 .  venlafaxine XR (EFFEXOR-XR) 150 MG 24 hr capsule, Take 1 capsule (150 mg total) by mouth daily with breakfast., Disp: 30 capsule, Rfl: 5 .  fenofibrate (TRICOR) 48 MG tablet, Take 1 tablet (48 mg total) by mouth daily., Disp: 90 tablet, Rfl: 0  Allergies  Allergen Reactions  . Dapsone Other (See Comments)    Hypoxemia  . Sulfa Antibiotics Rash     ROS  Ten systems reviewed and is negative except as mentioned in HPI   Objective  Vitals:   09/24/16 1443  BP: 128/76  Pulse: (!) 105  Resp: 16  Temp: 97.8 F (36.6 C)  TempSrc: Oral  SpO2: 90%  Weight: 190 lb  1.6 oz (86.2 kg)  Height: 5\' 4"  (1.626 m)    Body mass index is 32.63 kg/m.  Physical Exam  Constitutional: Patient appears well-developed and well-nourished. Obese No distress. She is using nasal cannula oxygen  HEENT: head atraumatic, normocephalic, pupils equal and reactive to light, neck supple, throat within normal limits Cardiovascular: Normal rate, regular rhythm and normal heart sounds.  No murmur heard. 1 plus  BLE edema. Pulmonary/Chest: Effort normal and breath sounds normal. No respiratory distress. Abdominal: Soft.  There is no tenderness. Psychiatric: Patient has a normal mood and affect. behavior is normal.  Judgment and thought content normal. Skin: purpura on both arms and some excoriation   PHQ2/9: Depression screen [facility] 2/9 09/24/2016 07/14/2016 06/01/2016 04/08/2016 01/12/2016  Decreased Interest 0 3 1 1  0  Down, Depressed, Hopeless 1 3 3 2  0  PHQ - 2 Score 1 6 4 3  0  Altered sleeping - 3 0 3 -  Tired, decreased energy - 3 3 3  -  Change in appetite - 0 3 0 -  Feeling bad or failure about yourself  - 3 0 3 -  Trouble concentrating - 0 0 0 -  Moving slowly or fidgety/restless - 3 0 3 -  Suicidal thoughts - 0 0 0 -  PHQ-9 Score - 18 10 15  -  Difficult doing work/chores - Not difficult at all Somewhat difficult Somewhat difficult -     Fall Risk: Fall Risk  09/24/2016 07/14/2016 06/01/2016 04/08/2016 01/12/2016  Falls in the past year? Yes Yes Yes No No  Number falls in past yr: 1 1 1  - -  Injury with Fall? No No No - -  Follow up - - Falls evaluation completed - -     Functional Status Survey: Is the patient deaf or have difficulty hearing?: No Does the patient have difficulty seeing, even when wearing glasses/contacts?: No Does the patient have difficulty concentrating, remembering, or making decisions?: No Does the patient have difficulty walking or climbing stairs?: No Does the patient have difficulty dressing or bathing?: No Does the patient have difficulty  doing errands alone such as visiting a doctor's office or shopping?: No   Assessment & Plan  1. Hospital discharge follow-up  Improved, but has lower extremity edema  2. Methemoglobinemia  Secondary to Dapsone, resolved  3. COPD exacerbation ([place])  Finished antibiotics, still on prednisone, but will finish tomorrow.  - predniSONE (DELTASONE) 10 MG tablet; Take 0.5 tablets (5 mg total) by mouth 2 (two) times daily with a meal.  Dispense: 10 tablet; Refill: 0  4. Hypoxemia  She is on 2.5 liters since last night, she was on 2 liters of oxygen upon discharge, however down on prednisone dose , advised to continue monitor pulse ox and if she requires more than 2.5 liters , we will give rx of prednisone to increase to at least 10 mg daily until follow up with pulmonologist   5. Bilateral lower extremity edema  She can't tolerate compression stocking hoses, she has not been taking HCTZ because bp has not been elevated. Advised to raise legs and take HCTZ for the next few days. Explained that anemia and malnutrition can cause swelling and diuretic will not help with that.  - COMPLETE METABOLIC PANEL WITH GFR - CBC with Differential/Platelet  6. Anemia, unspecified type  - CBC with Differential/Platelet - Iron, TIBC and Ferritin Panel

## 2016-09-26 NOTE — Telephone Encounter (Signed)
Patient seen.

## 2016-09-28 ENCOUNTER — Other Ambulatory Visit: Payer: Self-pay | Admitting: Family Medicine

## 2016-09-28 DIAGNOSIS — G8929 Other chronic pain: Secondary | ICD-10-CM

## 2016-09-28 DIAGNOSIS — M542 Cervicalgia: Principal | ICD-10-CM

## 2016-09-29 ENCOUNTER — Encounter: Payer: Self-pay | Admitting: Family Medicine

## 2016-09-29 ENCOUNTER — Other Ambulatory Visit: Payer: Self-pay | Admitting: Family Medicine

## 2016-09-29 MED ORDER — FENOFIBRATE 48 MG PO TABS: 48.0000 mg | ORAL_TABLET | Freq: Every day | ORAL | 0 refills | Status: DC

## 2016-10-01 ENCOUNTER — Encounter: Payer: Self-pay | Admitting: Family Medicine

## 2016-10-03 ENCOUNTER — Encounter: Payer: Self-pay | Admitting: Family Medicine

## 2016-10-04 ENCOUNTER — Encounter: Payer: Self-pay | Admitting: Family Medicine

## 2016-10-13 ENCOUNTER — Encounter: Payer: Self-pay | Admitting: Family Medicine

## 2016-10-13 ENCOUNTER — Ambulatory Visit (INDEPENDENT_AMBULATORY_CARE_PROVIDER_SITE_OTHER): Payer: Self-pay | Admitting: Family Medicine

## 2016-10-13 VITALS — BP 122/76 | HR 115 | Temp 98.2°F | Resp 14 | Wt 189.1 lb

## 2016-10-13 DIAGNOSIS — E8881 Metabolic syndrome: Secondary | ICD-10-CM

## 2016-10-13 DIAGNOSIS — K219 Gastro-esophageal reflux disease without esophagitis: Secondary | ICD-10-CM

## 2016-10-13 DIAGNOSIS — F411 Generalized anxiety disorder: Secondary | ICD-10-CM

## 2016-10-13 DIAGNOSIS — C801 Malignant (primary) neoplasm, unspecified: Secondary | ICD-10-CM

## 2016-10-13 DIAGNOSIS — J432 Centrilobular emphysema: Secondary | ICD-10-CM

## 2016-10-13 DIAGNOSIS — D692 Other nonthrombocytopenic purpura: Secondary | ICD-10-CM

## 2016-10-13 DIAGNOSIS — C799 Secondary malignant neoplasm of unspecified site: Secondary | ICD-10-CM

## 2016-10-13 DIAGNOSIS — F339 Major depressive disorder, recurrent, unspecified: Secondary | ICD-10-CM

## 2016-10-13 DIAGNOSIS — I10 Essential (primary) hypertension: Secondary | ICD-10-CM

## 2016-10-13 DIAGNOSIS — R1011 Right upper quadrant pain: Secondary | ICD-10-CM

## 2016-10-13 DIAGNOSIS — E781 Pure hyperglyceridemia: Secondary | ICD-10-CM

## 2016-10-13 DIAGNOSIS — G8929 Other chronic pain: Secondary | ICD-10-CM

## 2016-10-13 DIAGNOSIS — IMO0002 Reserved for concepts with insufficient information to code with codable children: Secondary | ICD-10-CM

## 2016-10-13 DIAGNOSIS — Z9981 Dependence on supplemental oxygen: Secondary | ICD-10-CM

## 2016-10-13 DIAGNOSIS — I479 Paroxysmal tachycardia, unspecified: Secondary | ICD-10-CM

## 2016-10-13 DIAGNOSIS — M542 Cervicalgia: Secondary | ICD-10-CM

## 2016-10-13 MED ORDER — HYDROCODONE-ACETAMINOPHEN 5-325 MG PO TABS: 1.0000 | ORAL_TABLET | Freq: Two times a day (BID) | ORAL | 0 refills | Status: DC

## 2016-10-13 MED ORDER — ALPRAZOLAM ER 1 MG PO TB24: 1.0000 mg | ORAL_TABLET | ORAL | 2 refills | Status: DC

## 2016-10-13 MED ORDER — OMEPRAZOLE 40 MG PO CPDR: 40.0000 mg | DELAYED_RELEASE_CAPSULE | Freq: Every day | ORAL | 5 refills | Status: DC

## 2016-10-13 MED ORDER — FENOFIBRATE 48 MG PO TABS: 48.0000 mg | ORAL_TABLET | Freq: Every day | ORAL | 5 refills | Status: DC

## 2016-10-13 MED ORDER — METOPROLOL SUCCINATE ER 50 MG PO TB24: 50.0000 mg | ORAL_TABLET | Freq: Every day | ORAL | 5 refills | Status: DC

## 2016-10-13 MED ORDER — VENLAFAXINE HCL ER 150 MG PO CP24: 150.0000 mg | ORAL_CAPSULE | Freq: Every day | ORAL | 5 refills | Status: DC

## 2016-10-13 MED ORDER — HYDROCODONE-ACETAMINOPHEN 5-325 MG PO TABS: 1.0000 | ORAL_TABLET | Freq: Two times a day (BID) | ORAL | 0 refills | Status: DC | PRN

## 2016-10-13 MED ORDER — DIVALPROEX SODIUM 250 MG PO DR TAB: 250.0000 mg | DELAYED_RELEASE_TABLET | Freq: Every day | ORAL | 5 refills | Status: DC

## 2016-10-13 MED ORDER — HYDROCODONE-ACETAMINOPHEN 5-325 MG PO TABS: 1.0000 | ORAL_TABLET | Freq: Four times a day (QID) | ORAL | 0 refills | Status: DC | PRN

## 2016-10-13 NOTE — Progress Notes (Signed)
Name: Judy Allen   MRN: [id number]    DOB: Jul 15, 1956   Date:10/13/2016       Progress Note  Subjective  Chief Complaint  Chief Complaint  Patient presents with  . Follow-up  . Depression  . Hyperlipidemia  . Hypertension    HPI  HTN: while at [facility] October 2017 her bp dropped and Toprol was changed to 25 mg daily and HCTZ was stopped. BP at home has been gradually getting higher and HR also increased, she is still only taking Metoprolol once a day, back on HCTZ because of edema.No chest pain, but she has palpitation. Heart rate can go up to 120's, usually low 100's at home.   RUQ pain: noticed pain on RUQ that radiates to her back after meals, does not matter what she eats. No nausea or vomiting. Pain is described as sharp.  Neck pain: neck pain is getting worse, and having radiculitis left arm again,  she was sent back to [facility]  Summer 2017 . She had a steroid injection injection for her facet arthropathy but there was no improvement of the pain. She was advised to try otc nsaid's and continue hydrocodone. Pain is described as sharp and throbbing pain, sometimes burning, currently a 6/10 and currently feels like an achy and burning. Discussed referral to go back, but she wants to hold off for now.   Chronic low back pain: she was seen by [facility], history of lumbar spine surgery and had injections by Dr. Arneta Cliche in the past with improvement of symptoms, no radiculitis, worse when bending forward.   GAD/Depression Major: currently on Effexor and Depakote because Abilify and Cymbalta was too expenisve- no longer could afford. She was doing well, but is very depressed because of cancer, COPD and using oxygen, currently pain is causing lack of energy.   She denies suicidal thoughts or ideation,she states appetite is okay but she has anhedonia.  Family is supportive, living with daughter.   Anal Squamous cell Carcinoma: seeing oncologist and  radiation oncologist at [facility]. She will have PET scan within the next month.   Emphysema: she used to smoke for many years, but quit almost 10 years ago, but was admitted to [facility] in October, had CT chest to rule out PE and was found to have centrilobular emphysema and also hypoxemia, she was sent home on 2 liters of oxygen. She has follow up soon with Dr. Mortimer Fries  Dysmetabolic syndrome: avoiding sweets, weight is stable, no polyphagia.   Hypertriglyceridemia: taking tricor and denies side effects, no chest pain   Patient Active Problem List   Diagnosis Date Noted  . Centrilobular emphysema ([place]) 07/14/2016  . ASCUS favor benign 04/14/2016  . Anal squamous cell carcinoma ([place]) 03/09/2016  . Metastatic squamous cell carcinoma ([place]) 02/25/2016  . Non-thrombocytopenic purpura ([place]) 01/12/2016  . Chronic constipation 10/16/2015  . History of fusion of cervical spine 04/02/2015  . Benign hypertension 01/16/2015  . Chronic cervical pain 01/16/2015  . CN (constipation) 01/16/2015  . Gastric reflux 01/16/2015  . Hypertriglyceridemia 01/16/2015  . Edema leg 01/16/2015  . Chronic recurrent major depressive disorder ([place]) 01/16/2015  . Dysmetabolic syndrome 49/44/9675  . Obstructive apnea 01/16/2015  . Psoriasis 01/16/2015  . Allergic rhinitis 01/16/2015  . Bursitis, trochanteric 01/16/2015  . GAD (generalized anxiety disorder) 01/16/2015  . Tachycardia, paroxysmal [facility])     Past Surgical History:  Procedure Laterality Date  . ANTERIOR FUSION LUMBAR SPINE    . CARDIAC CATHETERIZATION  2009?    [facility]  . CARDIAC CATHETERIZATION  05/2012   RHC: Mild hypertension 37/12 with a mean pressure of 21 mm mercury  . COLONOSCOPY  2008  . herniated disc repair    . MASS EXCISION Left 02/23/2016   Procedure: EXCISION MASS;  Surgeon: Christene Lye, MD;  Location: [facility] ORS;  Service: General;  Laterality: Left;  . NECK SURGERY    . PORTACATH PLACEMENT Left 03/12/2016   Procedure: INSERTION  PORT-A-CATH;  Surgeon: Christene Lye, MD;  Location: [facility] ORS;  Service: General;  Laterality: Left;  . tonsillectomy  1959   history  . TUBAL LIGATION     s/p    Family History  Problem Relation Age of Onset  . Heart attack Mother   . Breast cancer Maternal Aunt     Social History   Social History  . Marital status: Married    Spouse name: N/A  . Number of children: N/A  . Years of education: N/A   Occupational History  . Not on file.   Social History Main Topics  . Smoking status: Former Smoker    Packs/day: 1.00    Years: 32.00    Types: Cigarettes    Start date: 08/10/1963    Quit date: 01/15/2006  . Smokeless tobacco: Never Used  . Alcohol use No  . Drug use: No  . Sexual activity: No   Other Topics Concern  . Not on file   Social History Narrative  . No narrative on file     Current Outpatient Prescriptions:  .  ALPRAZolam (XANAX XR) 1 MG 24 hr tablet, Take 1 tablet (1 mg total) by mouth every morning., Disp: 30 tablet, Rfl: 2 .  clobetasol cream (TEMOVATE) 0.05 %, Apply topically 2 (two) times daily., Disp: 60 g, Rfl: 2 .  desonide (DESOWEN) 0.05 % cream, Apply topically 2 (two) times daily. (Patient taking differently: Apply topically 2 (two) times daily. FACE AND PRIVATE AREA), Disp: 60 g, Rfl: 2 .  divalproex (DEPAKOTE) 250 MG DR tablet, Take 1 tablet (250 mg total) by mouth at bedtime., Disp: 30 tablet, Rfl: 5 .  fenofibrate (TRICOR) 48 MG tablet, Take 1 tablet (48 mg total) by mouth daily., Disp: 30 tablet, Rfl: 5 .  gabapentin (NEURONTIN) 300 MG capsule, TAKE 1 CAPSULE TWICE A DAY, Disp: 180 capsule, Rfl: 1 .  hydrochlorothiazide (HYDRODIURIL) 12.5 MG tablet, Take 1 tablet (12.5 mg total) by mouth daily. (Patient taking differently: Take 12.5 mg by mouth as needed. ), Disp: 30 tablet, Rfl: 2 .  HYDROcodone-acetaminophen (NORCO/VICODIN) 5-325 MG tablet, Take 1 tablet by mouth every 6 (six) hours as needed for moderate pain., Disp: 30 tablet, Rfl:  0 .  HYDROcodone-acetaminophen (NORCO/VICODIN) 5-325 MG tablet, Take 1 tablet by mouth 2 (two) times daily before a meal., Disp: 60 tablet, Rfl: 0 .  HYDROcodone-acetaminophen (NORCO/VICODIN) 5-325 MG tablet, Take 1 tablet by mouth 2 (two) times daily., Disp: 60 tablet, Rfl: 0 .  metoprolol succinate (TOPROL-XL) 50 MG 24 hr tablet, Take 1 tablet (50 mg total) by mouth daily., Disp: 30 tablet, Rfl: 5 .  omeprazole (PRILOSEC) 40 MG capsule, Take 1 capsule (40 mg total) by mouth daily., Disp: 30 capsule, Rfl: 5 .  tiotropium (SPIRIVA) 18 MCG inhalation capsule, Place into inhaler and inhale., Disp: , Rfl:  .  tiZANidine (ZANAFLEX) 4 MG tablet, TAKE 1 TABLET (4 MG TOTAL) BY MOUTH 3 (THREE) TIMES DAILY., Disp: 90 tablet, Rfl: 2 .  venlafaxine XR (EFFEXOR-XR)  150 MG 24 hr capsule, Take 1 capsule (150 mg total) by mouth daily with breakfast., Disp: 30 capsule, Rfl: 5  Allergies  Allergen Reactions  . Dapsone Other (See Comments)    Hypoxemia  . Sulfa Antibiotics Rash     ROS  Ten systems reviewed and is negative except as mentioned in HPI   Objective  Vitals:   10/13/16 1005  BP: 122/76  Pulse: (!) 115  Resp: 14  Temp: 98.2 F (36.8 C)  TempSrc: Oral  SpO2: 92%  Weight: 189 lb 1.6 oz (85.8 kg)    Body mass index is 32.46 kg/m.  Physical Exam  Constitutional: Patient appears well-developed and well-nourished. Obese No distress.  HEENT: head atraumatic, normocephalic, pupils equal and reactive to light, neck supple, throat within normal limits Cardiovascular: Normal rate, regular rhythm and normal heart sounds.  No murmur heard. No BLE edema. Pulmonary/Chest: Effort normal , coarse crackles on both lunch fields  No respiratory distress. Nasal canula oxygen Abdominal: Soft.  There is tenderness RUQ  Psychiatric: Patient has a normal mood and affect. behavior is normal. Judgment and thought content normal. Muscular Skeletal: pain during palpation of lumbar spine, pain with rom of  her neck , negative straight leg raise  Recent Results (from the past 2160 hour(s))  COMPLETE METABOLIC PANEL WITH GFR     Status: Abnormal   Collection Time: 09/24/16  4:06 PM  Result Value Ref Range   Sodium 142 135 - 146 mmol/L   Potassium 4.2 3.5 - 5.3 mmol/L   Chloride 105 98 - 110 mmol/L   CO2 28 20 - 31 mmol/L   Glucose, Bld 98 65 - 99 mg/dL   BUN 7 7 - 25 mg/dL   Creat 0.95 0.50 - 0.99 mg/dL    Comment:   For patients > or = 62 years of age: The upper reference limit for Creatinine is approximately 13% higher for people identified as African-American.      Total Bilirubin 0.3 0.2 - 1.2 mg/dL   Alkaline Phosphatase 37 33 - 130 U/L   AST 19 10 - 35 U/L   ALT 30 (H) 6 - 29 U/L   Total Protein 6.5 6.1 - 8.1 g/dL   Albumin 4.1 3.6 - 5.1 g/dL   Calcium 9.3 8.6 - 10.4 mg/dL   GFR, Est African American 75 >=60 mL/min   GFR, Est Non African American 65 >=60 mL/min  CBC with Differential/Platelet     Status: Abnormal   Collection Time: 09/24/16  4:06 PM  Result Value Ref Range   WBC 9.3 3.8 - 10.8 K/uL   RBC 3.41 (L) 3.80 - 5.10 MIL/uL   Hemoglobin 10.6 (L) 11.7 - 15.5 g/dL   HCT 32.4 (L) 35.0 - 45.0 %   MCV 95.0 80.0 - 100.0 fL   MCH 31.1 27.0 - 33.0 pg   MCHC 32.7 32.0 - 36.0 g/dL   RDW 16.2 (H) 11.0 - 15.0 %   Platelets 244 140 - 400 K/uL   MPV 8.1 7.5 - 12.5 fL   Neutro Abs 7,719 1,500 - 7,800 cells/uL   Lymphs Abs 837 (L) 850 - 3,900 cells/uL   Monocytes Absolute 651 200 - 950 cells/uL   Eosinophils Absolute 93 15 - 500 cells/uL   Basophils Absolute 0 0 - 200 cells/uL   Neutrophils Relative % 83 %   Lymphocytes Relative 9 %   Monocytes Relative 7 %   Eosinophils Relative 1 %   Basophils Relative 0 %  Smear Review Criteria for review not met   Iron, TIBC and Ferritin Panel     Status: None   Collection Time: 09/24/16  4:06 PM  Result Value Ref Range   Ferritin 80 20 - 288 ng/mL   Iron 46 45 - 160 ug/dL   TIBC 370 250 - 450 ug/dL   %SAT 12 11 - 50 %       PHQ2/9: Depression screen [facility] 2/9 10/13/2016 09/24/2016 07/14/2016 06/01/2016 04/08/2016  Decreased Interest 1 0 '3 1 1  ' Down, Depressed, Hopeless '3 1 3 3 2  ' PHQ - 2 Score '4 1 6 4 3  ' Altered sleeping 1 - 3 0 3  Tired, decreased energy 3 - '3 3 3  ' Change in appetite 1 - 0 3 0  Feeling bad or failure about yourself  1 - 3 0 3  Trouble concentrating 0 - 0 0 0  Moving slowly or fidgety/restless 0 - 3 0 3  Suicidal thoughts 0 - 0 0 0  PHQ-9 Score 10 - '18 10 15  ' Difficult doing work/chores Somewhat difficult - Not difficult at all Somewhat difficult Somewhat difficult     Fall Risk: Fall Risk  10/13/2016 09/24/2016 07/14/2016 06/01/2016 04/08/2016  Falls in the past year? Yes Yes Yes Yes No  Number falls in past yr: '1 1 1 1 ' -  Injury with Fall? No No No No -  Follow up - - - Falls evaluation completed -      Functional Status Survey: Is the patient deaf or have difficulty hearing?: No Does the patient have difficulty seeing, even when wearing glasses/contacts?: No Does the patient have difficulty concentrating, remembering, or making decisions?: No Does the patient have difficulty walking or climbing stairs?: No Does the patient have difficulty dressing or bathing?: No Does the patient have difficulty doing errands alone such as visiting a doctor's office or shopping?: No   Assessment & Plan  1. Benign hypertension  Taking HCTZ prn,  - metoprolol succinate (TOPROL-XL) 50 MG 24 hr tablet; Take 1 tablet (50 mg total) by mouth daily.  Dispense: 30 tablet; Refill: 5  2. Metastatic squamous cell carcinoma ([place])  Going to [facility] and will have a PET scan soon. Finished chemo and radiation, but not cleared yet  3. Hypertriglyceridemia  - fenofibrate (TRICOR) 48 MG tablet; Take 1 tablet (48 mg total) by mouth daily.  Dispense: 30 tablet; Refill: 5  4. Chronic recurrent major depressive disorder ([place])  Depression is a little worse, she states mostly secondary to her pain, discussed  referral to pain clinic but she wants to hold off  - venlafaxine XR (EFFEXOR-XR) 150 MG 24 hr capsule; Take 1 capsule (150 mg total) by mouth daily with breakfast.  Dispense: 30 capsule; Refill: 5 - divalproex (DEPAKOTE) 250 MG DR tablet; Take 1 tablet (250 mg total) by mouth at bedtime.  Dispense: 30 tablet; Refill: 5  5. Dysmetabolic syndrome  Discussed life style modification   6. GAD (generalized anxiety disorder)  - venlafaxine XR (EFFEXOR-XR) 150 MG 24 hr capsule; Take 1 capsule (150 mg total) by mouth daily with breakfast.  Dispense: 30 capsule; Refill: 5 - ALPRAZolam (XANAX XR) 1 MG 24 hr tablet; Take 1 tablet (1 mg total) by mouth every morning.  Dispense: 30 tablet; Refill: 2  7. Chronic cervical pain  Still has daily pain, medication  - HYDROcodone-acetaminophen (NORCO/VICODIN) 5-325 MG tablet; Take 1 tablet by mouth  2 (two ) times daily hours as needed for moderate  pain.  Dispense: 60  tablet; Refill: 0 - HYDROcodone-acetaminophen (NORCO/VICODIN) 5-325 MG tablet; Take 1 tablet by mouth 2 (two) times daily before a meal.  Dispense: 60 tablet; Refill: 0 - HYDROcodone-acetaminophen (NORCO/VICODIN) 5-325 MG tablet; Take 1 tablet by mouth 2 (two) times daily.  Dispense: 60 tablet; Refill: 0  8. Centrilobular emphysema ([place])  Seeing Pulmonologist at [facility]  9. Oxygen dependent  Doing better, pulse ox today 92%  10. Non-thrombocytopenic purpura ([place])  Worse on both arms   11. Tachycardia, paroxysmal ([facility])  - metoprolol succinate (TOPROL-XL) 50 MG 24 hr tablet; Take 1 tablet (50 mg total) by mouth daily.  Dispense: 30 tablet; Refill: 5  12. Gastroesophageal reflux disease without esophagitis  - omeprazole (PRILOSEC) 40 MG capsule; Take 1 capsule (40 mg total) by mouth daily.  Dispense: 30 capsule; Refill: 5   13. RUQ pain  -[place] RUQ

## 2016-10-19 ENCOUNTER — Encounter: Payer: Self-pay | Admitting: Family Medicine

## 2016-10-22 ENCOUNTER — Ambulatory Visit
Admission: RE | Admit: 2016-10-22 | Discharge: 2016-10-22 | Disposition: A | Discharge status: Home / Self Care | Payer: Medicaid Other | Source: Ambulatory Visit | Attending: Family Medicine | Admitting: Family Medicine

## 2016-10-22 DIAGNOSIS — K824 Cholesterolosis of gallbladder: Secondary | ICD-10-CM | POA: Diagnosis not present

## 2016-10-22 DIAGNOSIS — K769 Liver disease, unspecified: Secondary | ICD-10-CM | POA: Insufficient documentation

## 2016-10-22 DIAGNOSIS — R1011 Right upper quadrant pain: Secondary | ICD-10-CM | POA: Diagnosis present

## 2016-10-25 ENCOUNTER — Encounter: Payer: Self-pay | Admitting: Family Medicine

## 2016-11-02 ENCOUNTER — Ambulatory Visit (INDEPENDENT_AMBULATORY_CARE_PROVIDER_SITE_OTHER): Payer: Medicaid Other | Admitting: General Surgery

## 2016-11-02 ENCOUNTER — Encounter: Payer: Self-pay | Admitting: General Surgery

## 2016-11-02 VITALS — BP 128/72 | HR 84 | Resp 16 | Ht 64.0 in | Wt 193.0 lb

## 2016-11-02 DIAGNOSIS — C210 Malignant neoplasm of anus, unspecified: Secondary | ICD-10-CM | POA: Diagnosis not present

## 2016-11-02 DIAGNOSIS — C801 Malignant (primary) neoplasm, unspecified: Secondary | ICD-10-CM | POA: Diagnosis not present

## 2016-11-02 DIAGNOSIS — R1011 Right upper quadrant pain: Secondary | ICD-10-CM | POA: Diagnosis not present

## 2016-11-02 DIAGNOSIS — IMO0002 Reserved for concepts with insufficient information to code with codable children: Secondary | ICD-10-CM

## 2016-11-02 DIAGNOSIS — C799 Secondary malignant neoplasm of unspecified site: Secondary | ICD-10-CM

## 2016-11-02 NOTE — Patient Instructions (Signed)
The patient is aware to call back for any questions or concerns.  

## 2016-11-02 NOTE — Progress Notes (Signed)
Patient ID: Judy Allen, female   DOB: 08-21-1955, 61 y.o.   MRN: [id number]  Chief Complaint  Patient presents with  . Follow-up    HPI Judy Allen is a 61 y.o. female.  Here today for follow up anal cancer. She states she has been having right upper quadrant pain (for one month) and had an ultrasound done 10-22-16 that showed a polyp in gallbladder. She states the pain comes and goes with occasional diarrhea. She does seem to think butter popcorn made the pain worse. She does feel a lymph node in the right groin. She completed her treatment in October 2017. PET scan is November 10 2016 at [facility]. I have reviewed the history of present illness with the patient.   HPI  Past Medical History:  Diagnosis Date  . Anemia    H/O  . Anxiety   . Arthritis   . Complication of anesthesia   . Constipation   . Dysrhythmia    TACHYCARDIA-WELL CONTROLLED ON METOPROLO  . GERD (gastroesophageal reflux disease)   . H/O allergic rhinitis   . Heart murmur   . Hemorrhoid   . Hyperlipidemia   . Hypertension   . Neck pain, chronic   . Neuritis   . Ovarian failure   . PONV (postoperative nausea and vomiting)   . Proteinuria   . Psoriasis   . Sleep apnea 09/12/2016   8cm H2O with 21pm oxygen mask: Eson size Medium. Heated humidifier for nasal dryness.  . Tachycardia, paroxysmal ([place])   . Urinary incontinence     Past Surgical History:  Procedure Laterality Date  . ANTERIOR FUSION LUMBAR SPINE    . CARDIAC CATHETERIZATION  2009?    [facility]  . CARDIAC CATHETERIZATION  05/2012   [facility]: Mild hypertension 37/12 with a mean pressure of 21 mm mercury  . COLONOSCOPY  2008  . herniated disc repair    . MASS EXCISION Left 02/23/2016   Procedure: EXCISION MASS;  Surgeon: Christene Lye, MD;  Location: [facility] ORS;  Service: General;  Laterality: Left;  . NECK SURGERY    . PORTACATH PLACEMENT Left 03/12/2016   Procedure: INSERTION PORT-A-CATH;  Surgeon: Christene Lye, MD;  Location: [facility]  ORS;  Service: General;  Laterality: Left;  . tonsillectomy  1959   history  . TUBAL LIGATION     s/p    Family History  Problem Relation Age of Onset  . Heart attack Mother   . Breast cancer Maternal Aunt     Social History Social History  Substance Use Topics  . Smoking status: Former Smoker    Packs/day: 1.00    Years: 32.00    Types: Cigarettes    Start date: 08/10/1963    Quit date: 01/15/2006  . Smokeless tobacco: Never Used  . Alcohol use No    Allergies  Allergen Reactions  . Dapsone Other (See Comments)    Hypoxemia  . Sulfa Antibiotics Rash    Current Outpatient Prescriptions  Medication Sig Dispense Refill  . ALPRAZolam (XANAX XR) 1 MG 24 hr tablet Take 1 tablet (1 mg total) by mouth every morning. 30 tablet 2  . clobetasol cream (TEMOVATE) 0.05 % Apply topically 2 (two) times daily. 60 g 2  . desonide (DESOWEN) 0.05 % cream Apply topically 2 (two) times daily. (Patient taking differently: Apply topically 2 (two) times daily. FACE AND PRIVATE AREA) 60 g 2  . divalproex (DEPAKOTE) 250 MG DR tablet Take 1 tablet (250 mg total) by mouth  at bedtime. 30 tablet 5  . fenofibrate (TRICOR) 48 MG tablet Take 1 tablet (48 mg total) by mouth daily. 30 tablet 5  . gabapentin (NEURONTIN) 300 MG capsule TAKE 1 CAPSULE TWICE A DAY 180 capsule 1  . hydrochlorothiazide (HYDRODIURIL) 12.5 MG tablet Take 1 tablet (12.5 mg total) by mouth daily. (Patient taking differently: Take 12.5 mg by mouth as needed. ) 30 tablet 2  . HYDROcodone-acetaminophen (NORCO/VICODIN) 5-325 MG tablet Take 1 tablet by mouth 2 (two) times daily before a meal. 60 tablet 0  . metoprolol succinate (TOPROL-XL) 50 MG 24 hr tablet Take 1 tablet (50 mg total) by mouth daily. 30 tablet 5  . omeprazole (PRILOSEC) 40 MG capsule Take 1 capsule (40 mg total) by mouth daily. 30 capsule 5  . OXYGEN Inhale 2 L into the lungs.    . tiotropium (SPIRIVA) 18 MCG inhalation capsule Place into inhaler and inhale.    Marland Kitchen  tiZANidine (ZANAFLEX) 4 MG tablet TAKE 1 TABLET (4 MG TOTAL) BY MOUTH 3 (THREE) TIMES DAILY. 90 tablet 2  . venlafaxine XR (EFFEXOR-XR) 150 MG 24 hr capsule Take 1 capsule (150 mg total) by mouth daily with breakfast. 30 capsule 5   No current facility-administered medications for this visit.     Review of Systems Review of Systems  Constitutional: Negative.   Respiratory: Negative.   Cardiovascular: Negative.   Gastrointestinal: Positive for abdominal pain and diarrhea. Negative for constipation, nausea and vomiting.    Blood pressure 128/72, pulse 84, resp. rate 16, height 5' 4"  (1.626 m), weight 193 lb (87.5 kg), last menstrual period 03/04/2002, SpO2 91 %.  Physical Exam Physical Exam  Constitutional: She is oriented to person, place, and time. She appears well-developed and well-nourished.  HENT:  Mouth/Throat: Oropharynx is clear and moist.  Eyes: Conjunctivae are normal. No scleral icterus.  Neck: Neck supple.  Cardiovascular: Normal rate, regular rhythm and normal heart sounds.   Pulmonary/Chest: Effort normal and breath sounds normal.  Abdominal: Soft. Normal appearance and bowel sounds are normal. There is tenderness in the right upper quadrant.  Genitourinary:  Genitourinary Comments: Rectal skin tags present  Lymphadenopathy:    She has no cervical adenopathy.    She has no axillary adenopathy.       Right: Inguinal adenopathy present.   One ill defined firm node right inguinal region, medial location  Neurological: She is alert and oriented to person, place, and time.  Skin: Skin is warm and dry.  Psychiatric: Her behavior is normal.    Data Reviewed   Assessment   SCC involving anal skin, mets to left inguinal node. Tiny gallbladder polyp likely not causing her recent abdominal pain. She completed her treatment in October 2017. PET scan is November 10 2016 at [facility]. Will review the PET/CT scan and advise pt after.      Plan    Follow up in 2 weeks which is  after scheduled PET scan     This information has been scribed by Karie Fetch RN, BSN,BC.   SANKAR,SEEPLAPUTHUR G 11/03/2016, 4:26 PM

## 2016-11-16 ENCOUNTER — Ambulatory Visit: Payer: Medicaid Other | Admitting: General Surgery

## 2016-11-24 ENCOUNTER — Encounter: Payer: Self-pay | Admitting: General Surgery

## 2016-11-24 ENCOUNTER — Ambulatory Visit (INDEPENDENT_AMBULATORY_CARE_PROVIDER_SITE_OTHER): Payer: Medicaid Other | Admitting: General Surgery

## 2016-11-24 VITALS — BP 130/76 | HR 70 | Resp 18 | Ht 64.0 in | Wt 190.0 lb

## 2016-11-24 DIAGNOSIS — C801 Malignant (primary) neoplasm, unspecified: Secondary | ICD-10-CM | POA: Diagnosis not present

## 2016-11-24 DIAGNOSIS — IMO0002 Reserved for concepts with insufficient information to code with codable children: Secondary | ICD-10-CM

## 2016-11-24 DIAGNOSIS — C799 Secondary malignant neoplasm of unspecified site: Secondary | ICD-10-CM | POA: Diagnosis not present

## 2016-11-24 NOTE — Progress Notes (Signed)
Patient ID: Judy Allen, female   DOB: 08/16/1955, 61 y.o.   MRN: [id number]  Chief Complaint  Patient presents with  . Procedure    port removal    HPI Judy Allen is a 61 y.o. female here today for a port removal. Patient reports the abdominal pain has improved.  HPI  Past Medical History:  Diagnosis Date  . Anemia    H/O  . Anxiety   . Arthritis   . Complication of anesthesia   . Constipation   . Dysrhythmia    TACHYCARDIA-WELL CONTROLLED ON METOPROLO  . GERD (gastroesophageal reflux disease)   . H/O allergic rhinitis   . Heart murmur   . Hemorrhoid   . Hyperlipidemia   . Hypertension   . Neck pain, chronic   . Neuritis   . Ovarian failure   . PONV (postoperative nausea and vomiting)   . Proteinuria   . Psoriasis   . Sleep apnea 09/12/2016   8cm H2O with 21pm oxygen mask: Eson size Medium. Heated humidifier for nasal dryness.  . Tachycardia, paroxysmal ([place])   . Urinary incontinence     Past Surgical History:  Procedure Laterality Date  . ANTERIOR FUSION LUMBAR SPINE    . CARDIAC CATHETERIZATION  2009?    [facility]  . CARDIAC CATHETERIZATION  05/2012   [facility]: Mild hypertension 37/12 with a mean pressure of 21 mm mercury  . COLONOSCOPY  2008  . herniated disc repair    . MASS EXCISION Left 02/23/2016   Procedure: EXCISION MASS;  Surgeon: Christene Lye, MD;  Location: [facility] ORS;  Service: General;  Laterality: Left;  . NECK SURGERY    . PORTACATH PLACEMENT Left 03/12/2016   Procedure: INSERTION PORT-A-CATH;  Surgeon: Christene Lye, MD;  Location: [facility] ORS;  Service: General;  Laterality: Left;  . tonsillectomy  1959   history  . TUBAL LIGATION     s/p    Family History  Problem Relation Age of Onset  . Heart attack Mother   . Breast cancer Maternal Aunt     Social History Social History  Substance Use Topics  . Smoking status: Former Smoker    Packs/day: 1.00    Years: 32.00    Types: Cigarettes    Start date: 08/10/1963    Quit  date: 01/15/2006  . Smokeless tobacco: Never Used  . Alcohol use No    Allergies  Allergen Reactions  . Dapsone Other (See Comments)    Hypoxemia  . Sulfa Antibiotics Rash    Current Outpatient Prescriptions  Medication Sig Dispense Refill  . ALPRAZolam (XANAX XR) 1 MG 24 hr tablet Take 1 tablet (1 mg total) by mouth every morning. 30 tablet 2  . clobetasol cream (TEMOVATE) 0.05 % Apply topically 2 (two) times daily. 60 g 2  . desonide (DESOWEN) 0.05 % cream Apply topically 2 (two) times daily. (Patient taking differently: Apply topically 2 (two) times daily. FACE AND PRIVATE AREA) 60 g 2  . divalproex (DEPAKOTE) 250 MG DR tablet Take 1 tablet (250 mg total) by mouth at bedtime. 30 tablet 5  . gabapentin (NEURONTIN) 300 MG capsule TAKE 1 CAPSULE TWICE A DAY 180 capsule 1  . hydrochlorothiazide (HYDRODIURIL) 12.5 MG tablet Take 1 tablet (12.5 mg total) by mouth daily. (Patient taking differently: Take 12.5 mg by mouth as needed. ) 30 tablet 2  . HYDROcodone-acetaminophen (NORCO/VICODIN) 5-325 MG tablet Take 1 tablet by mouth 2 (two) times daily before a meal. 60 tablet 0  .  metoprolol succinate (TOPROL-XL) 50 MG 24 hr tablet Take 1 tablet (50 mg total) by mouth daily. 30 tablet 5  . omeprazole (PRILOSEC) 40 MG capsule Take 1 capsule (40 mg total) by mouth daily. 30 capsule 5  . OXYGEN Inhale 2 L into the lungs.    . tiotropium (SPIRIVA) 18 MCG inhalation capsule Place into inhaler and inhale.    Marland Kitchen tiZANidine (ZANAFLEX) 4 MG tablet TAKE 1 TABLET (4 MG TOTAL) BY MOUTH 3 (THREE) TIMES DAILY. 90 tablet 2  . venlafaxine XR (EFFEXOR-XR) 150 MG 24 hr capsule Take 1 capsule (150 mg total) by mouth daily with breakfast. 30 capsule 5  . fenofibrate (TRICOR) 48 MG tablet Take 1 tablet (48 mg total) by mouth daily. 30 tablet 5   No current facility-administered medications for this visit.     Review of Systems Review of Systems  Constitutional: Negative.   Respiratory: Negative.    Cardiovascular: Negative.     Blood pressure 130/76, pulse 70, resp. rate 18, height 5\' 4"  (1.626 m), weight 190 lb (86.2 kg), last menstrual period 03/04/2002.  Physical Exam Physical Exam  Constitutional: She is oriented to person, place, and time. She appears well-developed and well-nourished.  Neurological: She is alert and oriented to person, place, and time.  Skin: Skin is warm and dry.    Data Reviewed Recent PET scan showed no evidence of recurrent or metastatic carcinoma from her anal primary.   Assessment    CA anal canal, post treatment. Doing well. Received OK to removed port from oncologist.     Plan    Procedure note: Port area was prepped with chloroprep.  35ml of 0.5% mixed with 1% xylocaine was used. Area was draped with sterile towels. Prior skin incision was opened. Capsule around the port was opened. 3 prolene stitches used to achor the port were removed. Port and catheter were then successfully removed in entirety. Subcutaneous tissue was closed with 3-0 vicryl and the skin was closed with subcuticular 4-0 vicryl. Steritrips and tincture benzoin applied. Telfa tegaderm dressing. No immediate problems from procedure.     Return in six months. The patient is aware to call back for any questions or concerns.   HPI, Physical Exam, Assessment and Plan have been scribed under the direction and in the presence of Mckinley Jewel, MD  Gaspar Cola, CMA   I have completed the exam and reviewed the above documentation for accuracy and completeness.  I agree with the above.  Haematologist has been used and any errors in dictation or transcription are unintentional.  Beyounce Dickens G. Jamal Collin, M.D., F.A.C.S.  Junie Panning G 11/25/2016, 10:45 AM

## 2016-11-24 NOTE — Patient Instructions (Signed)
Return in six months. The patient is aware to call back for any questions or concerns.  

## 2016-12-14 ENCOUNTER — Encounter: Payer: Self-pay | Admitting: Family Medicine

## 2016-12-15 ENCOUNTER — Other Ambulatory Visit: Payer: Self-pay | Admitting: Family Medicine

## 2016-12-15 DIAGNOSIS — IMO0002 Reserved for concepts with insufficient information to code with codable children: Secondary | ICD-10-CM

## 2016-12-15 DIAGNOSIS — C799 Secondary malignant neoplasm of unspecified site: Secondary | ICD-10-CM

## 2016-12-16 ENCOUNTER — Encounter: Payer: Self-pay | Admitting: Family Medicine

## 2016-12-22 ENCOUNTER — Ambulatory Visit (INDEPENDENT_AMBULATORY_CARE_PROVIDER_SITE_OTHER): Payer: Medicaid Other | Admitting: Family Medicine

## 2016-12-22 ENCOUNTER — Encounter: Payer: Self-pay | Admitting: Family Medicine

## 2016-12-22 VITALS — BP 126/82 | HR 96 | Temp 97.7°F | Resp 16 | Ht 64.0 in | Wt 188.2 lb

## 2016-12-22 DIAGNOSIS — E8881 Metabolic syndrome: Secondary | ICD-10-CM

## 2016-12-22 DIAGNOSIS — J432 Centrilobular emphysema: Secondary | ICD-10-CM | POA: Diagnosis not present

## 2016-12-22 DIAGNOSIS — I10 Essential (primary) hypertension: Secondary | ICD-10-CM

## 2016-12-22 DIAGNOSIS — IMO0002 Reserved for concepts with insufficient information to code with codable children: Secondary | ICD-10-CM

## 2016-12-22 DIAGNOSIS — Z9981 Dependence on supplemental oxygen: Secondary | ICD-10-CM

## 2016-12-22 DIAGNOSIS — C801 Malignant (primary) neoplasm, unspecified: Secondary | ICD-10-CM

## 2016-12-22 DIAGNOSIS — F411 Generalized anxiety disorder: Secondary | ICD-10-CM

## 2016-12-22 DIAGNOSIS — C799 Secondary malignant neoplasm of unspecified site: Secondary | ICD-10-CM

## 2016-12-22 DIAGNOSIS — D692 Other nonthrombocytopenic purpura: Secondary | ICD-10-CM

## 2016-12-22 DIAGNOSIS — E781 Pure hyperglyceridemia: Secondary | ICD-10-CM

## 2016-12-22 DIAGNOSIS — K760 Fatty (change of) liver, not elsewhere classified: Secondary | ICD-10-CM | POA: Diagnosis not present

## 2016-12-22 DIAGNOSIS — M542 Cervicalgia: Secondary | ICD-10-CM

## 2016-12-22 DIAGNOSIS — G8929 Other chronic pain: Secondary | ICD-10-CM

## 2016-12-22 DIAGNOSIS — K8020 Calculus of gallbladder without cholecystitis without obstruction: Secondary | ICD-10-CM | POA: Diagnosis not present

## 2016-12-22 MED ORDER — ALPRAZOLAM ER 1 MG PO TB24: 1.0000 mg | ORAL_TABLET | ORAL | 1 refills | Status: DC

## 2016-12-22 MED ORDER — HYDROCODONE-ACETAMINOPHEN 5-325 MG PO TABS: 1.0000 | ORAL_TABLET | Freq: Two times a day (BID) | ORAL | 0 refills | Status: DC

## 2016-12-22 MED ORDER — HYDROCODONE-ACETAMINOPHEN 5-325MG PREPACK (~~LOC~~: 1.0000 | ORAL_TABLET | Freq: Two times a day (BID) | ORAL | 0 refills | Status: DC

## 2016-12-22 MED ORDER — HYDROCODONE-ACETAMINOPHEN 5-325 MG PO TABS: 1.0000 | ORAL_TABLET | Freq: Four times a day (QID) | ORAL | 0 refills | Status: DC | PRN

## 2016-12-22 NOTE — Progress Notes (Signed)
Name: Judy Allen   MRN: [id number]    DOB: 10-May-1956   Date:12/22/2016       Progress Note  Subjective  Chief Complaint  Chief Complaint  Patient presents with  . Medication Refill    3 month F/U  . Hypertension  . Neck Pain    Getting worse again  . Gastroesophageal Reflux    States it is ok  . Depression  . Anal Squamous cell Carcinoma    PET Scan came back showing cancer is gone, goes every back every 3 month now    HPI  HTN: bp is back to baseline and she has been taking all medications daily, no chest pain, she has occasional palpitation.   RUQ pain: resolved, but PET scan done in April showed gallstones, if recurrence of symptoms will need surgery   Neck pain: neck pain is stable, and having radiculitis left arm again, she was sent back to [facility]  Summer 2017 . She had a steroid injection injection for her facet arthropathy but there was no improvement of the pain. She was advised to try otc nsaid's and continue hydrocodone. Pain is described as sharp and throbbing pain, sometimes burning, currently a 6/10 and currently feels like an achy and burning.   Chronic low back pain: she was seen by [facility], history of lumbar spine surgery and had injections by Dr. Arneta Cliche in the past with improvement of symptoms, no radiculitis, worse when bending forward. Taking hydrocodone and also muscle relaxer and last drug screen was done 05/2016 , [place] database checked and I am the only rx of controlled medications  GAD/Depression Major: currently on Effexor and Depakote because Abilify and Cymbalta was too expenisve- no longer could afford. She is doing better, she was treated for cancer, no longer feeling angry, but has crying spells on watching sad TV shows. She denies suicidal thoughts or ideation, she states appetite is okay, she has noticed improvement of energy, doing more house work and house kept cleaned, also cooking for her family Living  with daughter.   Anal Squamous cell Carcinoma: seeing oncologist and radiation oncologist at [facility]. PET scan was reviewed with patient  Emphysema: she used to smoke for many years, but quit almost 10 years ago, but was admitted to [facility] in October, had CT chest to rule out PE and was found to have centrilobular emphysema and also hypoxemia, she was sent home on 2 liters of oxygen. She has follow up soon with Dr. Mortimer Fries  OSA ;diagnosed at [facility], on 8 cm H2 O pressure, but not compliant with CPAP over the past week, advised to wear it every night.   Dysmetabolic syndrome: avoiding sweets, weight is stable, no polyphagia.   Hypertriglyceridemia: taking tricor and denies side effects, no chest pain  Patient Active Problem List   Diagnosis Date Noted  . Gallstones 12/22/2016  . Fatty liver 12/22/2016  . Chronic respiratory failure with hypoxia, on home oxygen therapy ([place]) 07/28/2016  . Centrilobular emphysema ([place]) 07/14/2016  . ASCUS favor benign 04/14/2016  . Anal squamous cell carcinoma ([place]) 03/09/2016  . Metastatic squamous cell carcinoma ([place]) 02/25/2016  . Non-thrombocytopenic purpura ([place]) 01/12/2016  . Chronic constipation 10/16/2015  . History of fusion of cervical spine 04/02/2015  . Benign hypertension 01/16/2015  . Chronic cervical pain 01/16/2015  . CN (constipation) 01/16/2015  . Gastric reflux 01/16/2015  . Hypertriglyceridemia 01/16/2015  . Edema leg 01/16/2015  . Chronic recurrent major depressive disorder ([place]) 01/16/2015  .  Dysmetabolic syndrome 62/70/3500  . Obstructive apnea 01/16/2015  . Psoriasis 01/16/2015  . Allergic rhinitis 01/16/2015  . Bursitis, trochanteric 01/16/2015  . GAD (generalized anxiety disorder) 01/16/2015  . Tachycardia, paroxysmal [facility])     Past Surgical History:  Procedure Laterality Date  . ANTERIOR FUSION LUMBAR SPINE    . CARDIAC CATHETERIZATION  2009?    [facility]  . CARDIAC CATHETERIZATION  05/2012   [facility]: Mild hypertension 37/12  with a mean pressure of 21 mm mercury  . COLONOSCOPY  2008  . herniated disc repair    . MASS EXCISION Left 02/23/2016   Procedure: EXCISION MASS;  Surgeon: Christene Lye, MD;  Location: [facility] ORS;  Service: General;  Laterality: Left;  . NECK SURGERY    . PORTACATH PLACEMENT Left 03/12/2016   Procedure: INSERTION PORT-A-CATH;  Surgeon: Christene Lye, MD;  Location: [facility] ORS;  Service: General;  Laterality: Left;  . tonsillectomy  1959   history  . TUBAL LIGATION     s/p    Family History  Problem Relation Age of Onset  . Heart attack Mother   . Breast cancer Maternal Aunt     Social History   Social History  . Marital status: Married    Spouse name: N/A  . Number of children: N/A  . Years of education: N/A   Occupational History  . Not on file.   Social History Main Topics  . Smoking status: Former Smoker    Packs/day: 1.00    Years: 32.00    Types: Cigarettes    Start date: 08/10/1963    Quit date: 01/15/2006  . Smokeless tobacco: Never Used  . Alcohol use No  . Drug use: No  . Sexual activity: No   Other Topics Concern  . Not on file   Social History Narrative  . No narrative on file     Current Outpatient Prescriptions:  .  albuterol (PROVENTIL) (2.5 MG/3ML) 0.083% nebulizer solution, INHALE 3 ML EVERY 6 HOURS BY NEBULIZER AS NEEDED FOR WHEEZING AND SHORTNESS OF BREATH, Disp: , Rfl: 4 .  ALPRAZolam (XANAX XR) 1 MG 24 hr tablet, Take 1 tablet (1 mg total) by mouth every morning., Disp: 30 tablet, Rfl: 2 .  clobetasol cream (TEMOVATE) 0.05 %, Apply topically 2 (two) times daily., Disp: 60 g, Rfl: 2 .  desonide (DESOWEN) 0.05 % cream, Apply topically 2 (two) times daily. (Patient taking differently: Apply topically 2 (two) times daily. FACE AND PRIVATE AREA), Disp: 60 g, Rfl: 2 .  divalproex (DEPAKOTE) 250 MG DR tablet, Take 1 tablet (250 mg total) by mouth at bedtime., Disp: 30 tablet, Rfl: 5 .  fenofibrate (TRICOR) 48 MG tablet, Take 1 tablet (48 mg  total) by mouth daily., Disp: 30 tablet, Rfl: 5 .  gabapentin (NEURONTIN) 300 MG capsule, TAKE 1 CAPSULE TWICE A DAY, Disp: 180 capsule, Rfl: 1 .  hydrochlorothiazide (HYDRODIURIL) 12.5 MG tablet, Take 1 tablet (12.5 mg total) by mouth daily. (Patient taking differently: Take 12.5 mg by mouth as needed. ), Disp: 30 tablet, Rfl: 2 .  HYDROcodone-acetaminophen (NORCO/VICODIN) 5-325 MG tablet, Take 1 tablet by mouth 2 (two) times daily before a meal., Disp: 60 tablet, Rfl: 0 .  metoprolol succinate (TOPROL-XL) 50 MG 24 hr tablet, Take 1 tablet (50 mg total) by mouth daily., Disp: 30 tablet, Rfl: 5 .  omeprazole (PRILOSEC) 40 MG capsule, Take 1 capsule (40 mg total) by mouth daily., Disp: 30 capsule, Rfl: 5 .  OXYGEN, Inhale 2 L  into the lungs., Disp: , Rfl:  .  tiotropium (SPIRIVA) 18 MCG inhalation capsule, Place into inhaler and inhale., Disp: , Rfl:  .  tiZANidine (ZANAFLEX) 4 MG tablet, TAKE 1 TABLET (4 MG TOTAL) BY MOUTH 3 (THREE) TIMES DAILY., Disp: 90 tablet, Rfl: 2 .  venlafaxine XR (EFFEXOR-XR) 150 MG 24 hr capsule, Take 1 capsule (150 mg total) by mouth daily with breakfast., Disp: 30 capsule, Rfl: 5  Allergies  Allergen Reactions  . Dapsone Other (See Comments)    Hypoxemia  . Sulfa Antibiotics Rash     ROS  Constitutional: Negative for fever or weight change.  Respiratory: negative for cough but has  shortness of breath.   Cardiovascular: Negative for chest pain , positive for intermittent  palpitations.  Gastrointestinal: Negative for abdominal pain, no bowel changes.  Musculoskeletal: Negative for gait problem or joint swelling.  Skin: Positive  for rash. Psoriasis  Neurological: Negative for dizziness or headache.  No other specific complaints in a complete review of systems (except as listed in HPI above).   Objective  Vitals:   12/22/16 1151  BP: 126/82  Pulse: 96  Resp: 16  Temp: 97.7 F (36.5 C)  TempSrc: Oral  SpO2: 94%  Weight: 188 lb 3.2 oz (85.4 kg)   Height: '5\' 4"'  (1.626 m)    Body mass index is 32.3 kg/m.  Physical Exam  Constitutional: Patient appears well-developed and well-nourished. Obese. No distress.  HEENT: head atraumatic, normocephalic, pupils equal and reactive to light, neck supple, but has decrease rom,, throat within normal limits Cardiovascular: Normal rate, regular rhythm and normal heart sounds. No murmur heard. Trace  BLE edema. Pulmonary/Chest: Effort normal , normal lung sounds, on nasal canula oxygen 2 liters Abdominal: Soft. There is tenderness RUQ  Skin: purpura and also psoriatic plaques, arms and legs Psychiatric: Patient has a normal mood and affect. behavior is normal. Judgment and thought content normal. Muscular Skeletal: nopain during palpation of lumbar spine,negative straight leg raise  Recent Results (from the past 2160 hour(s))  COMPLETE METABOLIC PANEL WITH GFR     Status: Abnormal   Collection Time: 09/24/16  4:06 PM  Result Value Ref Range   Sodium 142 135 - 146 mmol/L   Potassium 4.2 3.5 - 5.3 mmol/L   Chloride 105 98 - 110 mmol/L   CO2 28 20 - 31 mmol/L   Glucose, Bld 98 65 - 99 mg/dL   BUN 7 7 - 25 mg/dL   Creat 0.95 0.50 - 0.99 mg/dL    Comment:   For patients > or = 62 years of age: The upper reference limit for Creatinine is approximately 13% higher for people identified as African-American.      Total Bilirubin 0.3 0.2 - 1.2 mg/dL   Alkaline Phosphatase 37 33 - 130 U/L   AST 19 10 - 35 U/L   ALT 30 (H) 6 - 29 U/L   Total Protein 6.5 6.1 - 8.1 g/dL   Albumin 4.1 3.6 - 5.1 g/dL   Calcium 9.3 8.6 - 10.4 mg/dL   GFR, Est African American 75 >=60 mL/min   GFR, Est Non African American 65 >=60 mL/min  CBC with Differential/Platelet     Status: Abnormal   Collection Time: 09/24/16  4:06 PM  Result Value Ref Range   WBC 9.3 3.8 - 10.8 K/uL   RBC 3.41 (L) 3.80 - 5.10 MIL/uL   Hemoglobin 10.6 (L) 11.7 - 15.5 g/dL   HCT 32.4 (L) 35.0 - 45.0 %  MCV 95.0 80.0 - 100.0 fL   MCH  31.1 27.0 - 33.0 pg   MCHC 32.7 32.0 - 36.0 g/dL   RDW 16.2 (H) 11.0 - 15.0 %   Platelets 244 140 - 400 K/uL   MPV 8.1 7.5 - 12.5 fL   Neutro Abs 7,719 1,500 - 7,800 cells/uL   Lymphs Abs 837 (L) 850 - 3,900 cells/uL   Monocytes Absolute 651 200 - 950 cells/uL   Eosinophils Absolute 93 15 - 500 cells/uL   Basophils Absolute 0 0 - 200 cells/uL   Neutrophils Relative % 83 %   Lymphocytes Relative 9 %   Monocytes Relative 7 %   Eosinophils Relative 1 %   Basophils Relative 0 %   Smear Review Criteria for review not met   Iron, TIBC and Ferritin Panel     Status: None   Collection Time: 09/24/16  4:06 PM  Result Value Ref Range   Ferritin 80 20 - 288 ng/mL   Iron 46 45 - 160 ug/dL   TIBC 370 250 - 450 ug/dL   %SAT 12 11 - 50 %     PHQ2/9: Depression screen [facility] 2/9 12/22/2016 10/13/2016 09/24/2016 07/14/2016 06/01/2016  Decreased Interest 0 1 0 3 1  Down, Depressed, Hopeless 0 '3 1 3 3  ' PHQ - 2 Score 0 '4 1 6 4  ' Altered sleeping - 1 - 3 0  Tired, decreased energy - 3 - 3 3  Change in appetite - 1 - 0 3  Feeling bad or failure about yourself  - 1 - 3 0  Trouble concentrating - 0 - 0 0  Moving slowly or fidgety/restless - 0 - 3 0  Suicidal thoughts - 0 - 0 0  PHQ-9 Score - 10 - 18 10  Difficult doing work/chores - Somewhat difficult - Not difficult at all Somewhat difficult     Fall Risk: Fall Risk  12/22/2016 10/13/2016 09/24/2016 07/14/2016 06/01/2016  Falls in the past year? No Yes Yes Yes Yes  Number falls in past yr: - '1 1 1 1  ' Injury with Fall? - No No No No  Follow up - - - - Falls evaluation completed     Functional Status Survey: Is the patient deaf or have difficulty hearing?: No Does the patient have difficulty seeing, even when wearing glasses/contacts?: No Does the patient have difficulty concentrating, remembering, or making decisions?: No Does the patient have difficulty walking or climbing stairs?: No Does the patient have difficulty dressing or bathing?:  No Does the patient have difficulty doing errands alone such as visiting a doctor's office or shopping?: No    Assessment & Plan  1. Benign hypertension  At goal   2. GAD (generalized anxiety disorder)  - ALPRAZolam (XANAX XR) 1 MG 24 hr tablet; Take 1 tablet (1 mg total) by mouth every morning. Fill June 6th and monthly thereafter  Dispense: 30 tablet; Refill: 1  3. Hypertriglyceridemia    4. Metastatic squamous cell carcinoma ([place])  Still on follow ups every 3 months   5. Dysmetabolic syndrome  Discussed life style modification   6. Oxygen dependent  stable  7. Centrilobular emphysema (H[facility])  Continue oxygen  8. Gallstones  Discussed signs and symptoms of acute cholecystitis   9. Fatty liver  - Ambulatory referral to Gastroenterology  10. Non-thrombocytopenic purpura ([place])  stable  11. Chronic cervical pain  - HYDROcodone-acetaminophen (NORCO/VICODIN) 5-325 MG tablet; Take 1 tablet by mouth every 6 (six) hours as needed for  moderate pain.  Dispense: 30 tablet; Refill: 0 - HYDROcodone-acetaminophen (NORCO/VICODIN) 5-325 MG tablet; Take 1 tablet by mouth 2 (two) times daily before a meal.  Dispense: 60 tablet; Refill: 0

## 2016-12-26 ENCOUNTER — Encounter: Payer: Self-pay | Admitting: Family Medicine

## 2016-12-31 ENCOUNTER — Encounter: Payer: Self-pay | Admitting: Family Medicine

## 2016-12-31 DIAGNOSIS — Z1231 Encounter for screening mammogram for malignant neoplasm of breast: Secondary | ICD-10-CM

## 2017-01-04 ENCOUNTER — Other Ambulatory Visit: Payer: Self-pay | Admitting: Family Medicine

## 2017-01-04 DIAGNOSIS — G8929 Other chronic pain: Secondary | ICD-10-CM

## 2017-01-04 DIAGNOSIS — M542 Cervicalgia: Principal | ICD-10-CM

## 2017-01-04 NOTE — Telephone Encounter (Signed)
Patient requesting refill of Tizanidine to [facility],.

## 2017-01-05 ENCOUNTER — Encounter: Payer: Self-pay | Admitting: Family Medicine

## 2017-01-05 ENCOUNTER — Other Ambulatory Visit: Payer: Self-pay | Admitting: Emergency Medicine

## 2017-01-05 DIAGNOSIS — M542 Cervicalgia: Principal | ICD-10-CM

## 2017-01-05 DIAGNOSIS — G8929 Other chronic pain: Secondary | ICD-10-CM

## 2017-01-05 MED ORDER — TIZANIDINE HCL 4 MG PO TABS: ORAL_TABLET | ORAL | 2 refills | Status: DC

## 2017-01-06 ENCOUNTER — Other Ambulatory Visit: Payer: Self-pay | Admitting: Emergency Medicine

## 2017-01-06 DIAGNOSIS — G8929 Other chronic pain: Secondary | ICD-10-CM

## 2017-01-06 DIAGNOSIS — M542 Cervicalgia: Principal | ICD-10-CM

## 2017-01-06 MED ORDER — TIZANIDINE HCL 4 MG PO TABS: ORAL_TABLET | ORAL | 2 refills | Status: DC

## 2017-01-24 ENCOUNTER — Encounter: Payer: Self-pay | Admitting: Gastroenterology

## 2017-01-24 ENCOUNTER — Other Ambulatory Visit: Payer: Self-pay

## 2017-01-24 ENCOUNTER — Ambulatory Visit (INDEPENDENT_AMBULATORY_CARE_PROVIDER_SITE_OTHER): Payer: Medicaid Other | Admitting: Gastroenterology

## 2017-01-24 VITALS — BP 145/82 | HR 81 | Temp 98.4°F | Ht 64.0 in | Wt 185.0 lb

## 2017-01-24 DIAGNOSIS — K760 Fatty (change of) liver, not elsewhere classified: Secondary | ICD-10-CM

## 2017-01-24 DIAGNOSIS — R1084 Generalized abdominal pain: Secondary | ICD-10-CM | POA: Diagnosis not present

## 2017-01-24 NOTE — Patient Instructions (Addendum)
Life-style modification to include routine physical activity of at least 20-30 minutes daily as well as dietary modification was discussed. Specifically a Mediterranean style diet which is composed of fish, white meat, fresh fruit, vegetable, whole grains, and legumes was recommended. Foods enriched with excess refined sugars, such as high fructose corn syrup, sweetened beveraages and hydrogenated fats ( i,e. the "Western Diet") should be avoided. A higher protein, lower carbohydrate, lower fat diet is ideal in decreasing excess hydrogenated fats and complex sugars. Coffee consumption has been proven to be beneficial in improving liver injury. Routine physical activity which results in 3-5% weight loss improves insulin resistance and hepatic steatosis. Weight loss of 5-10% may improve nonalcoholic steatohepatisis or even hepatic fibrosis. Both diet and exercise are the foundation of treatment for patients with NAFLD.   You are scheduled for a RUQ abdominal US/H[facility] at [facility] on Tuesday, July 3rd @ 9:00am. Please arrive at the medical mall 8:45am. You cannot have anything to eat or drink after midnight Monday night.   You will need to stop any pain medications 6 hours prior to scan.  If you need to reschedule this scan for any reason, please contact central scheduling at [phone].

## 2017-01-25 LAB — ALPHA-1-ANTITRYPSIN: A1 ANTITRYPSIN: 128 mg/dL (ref 90–200)

## 2017-01-25 NOTE — Progress Notes (Signed)
Gastroenterology Consultation  Referring Provider:     Steele Sizer, MD Primary Care Physician:  Steele Sizer, MD Primary Gastroenterologist:  Dr. Allen Norris     Reason for Consultation:     Fatty liver        HPI:   Judy Allen is a 61 y.o. y/o female referred for consultation & management of Fatty liver by Dr. Ancil Boozer, Drue Stager, MD.  This patient comes today with a finding of increased echogenicity on her ultrasound consistent with fatty liver.  The patient's liver enzymes were only slightly elevated in only 1 of the indices.  The patient has been reporting some right upper quadrant pain and there was a 7 mm polyp seen in the gallbladder with a recommended repeat ultrasound in one year.  The patient denies any alcohol abuse.  She denies any unexplained weight loss fevers chills nausea or vomiting.  She also denies any past history of liver disease.  The patient has a history of increased cholesterol.  Past Medical History:  Diagnosis Date  . Anemia    H/O  . Anxiety   . Arthritis   . Complication of anesthesia   . Constipation   . Dysrhythmia    TACHYCARDIA-WELL CONTROLLED ON METOPROLO  . GERD (gastroesophageal reflux disease)   . H/O allergic rhinitis   . Heart murmur   . Hemorrhoid   . Hyperlipidemia   . Hypertension   . Neck pain, chronic   . Neuritis   . Ovarian failure   . PONV (postoperative nausea and vomiting)   . Proteinuria   . Psoriasis   . Sleep apnea 09/12/2016   8cm H2O with 21pm oxygen mask: Eson size Medium. Heated humidifier for nasal dryness.  . Tachycardia, paroxysmal ([place])   . Urinary incontinence     Past Surgical History:  Procedure Laterality Date  . ANTERIOR FUSION LUMBAR SPINE    . CARDIAC CATHETERIZATION  2009?    [facility]  . CARDIAC CATHETERIZATION  05/2012   [facility]: Mild hypertension 37/12 with a mean pressure of 21 mm mercury  . COLONOSCOPY  2008  . herniated disc repair    . MASS EXCISION Left 02/23/2016   Procedure: EXCISION MASS;   Surgeon: Christene Lye, MD;  Location: [facility] ORS;  Service: General;  Laterality: Left;  . NECK SURGERY    . PORTACATH PLACEMENT Left 03/12/2016   Procedure: INSERTION PORT-A-CATH;  Surgeon: Christene Lye, MD;  Location: [facility] ORS;  Service: General;  Laterality: Left;  . tonsillectomy  1959   history  . TUBAL LIGATION     s/p    Prior to Admission medications   Medication Sig Start Date End Date Taking? Authorizing Provider  albuterol (PROVENTIL) (2.5 MG/3ML) 0.083% nebulizer solution INHALE 3 ML EVERY 6 HOURS BY NEBULIZER AS NEEDED FOR WHEEZING AND SHORTNESS OF BREATH 11/23/16  Yes [provider]  ALPRAZolam (XANAX XR) 1 MG 24 hr tablet Take 1 tablet (1 mg total) by mouth every morning. Fill June 6th and monthly thereafter 12/22/16  Yes Sowles, Drue Stager, MD  clobetasol cream (TEMOVATE) 0.05 % Apply topically 2 (two) times daily. 12/30/15  Yes Sowles, Drue Stager, MD  desonide (DESOWEN) 0.05 % cream Apply topically 2 (two) times daily. Patient taking differently: Apply topically 2 (two) times daily. FACE AND PRIVATE AREA 12/30/15  Yes Sowles, Drue Stager, MD  divalproex (DEPAKOTE) 250 MG DR tablet Take 1 tablet (250 mg total) by mouth at bedtime. 10/13/16  Yes Steele Sizer, MD  gabapentin (NEURONTIN) 300  MG capsule TAKE 1 CAPSULE TWICE A DAY 07/06/16  Yes Sowles, Drue Stager, MD  hydrochlorothiazide (HYDRODIURIL) 12.5 MG tablet Take 1 tablet (12.5 mg total) by mouth daily. Patient taking differently: Take 12.5 mg by mouth as needed.  07/14/16  Yes Sowles, Drue Stager, MD  HYDROcodone-acetaminophen (NORCO/VICODIN) 5-325 MG tablet Take 1 tablet by mouth every 6 (six) hours as needed for moderate pain. 12/22/16  Yes Sowles, Drue Stager, MD  HYDROcodone-acetaminophen (NORCO/VICODIN) 5-325 MG tablet Take 1 tablet by mouth 2 (two) times daily before a meal. 12/22/16  Yes Sowles, Drue Stager, MD  metoprolol succinate (TOPROL-XL) 50 MG 24 hr tablet Take 1 tablet (50 mg total) by mouth daily. 10/13/16  Yes  Sowles, Drue Stager, MD  omeprazole (PRILOSEC) 40 MG capsule Take 1 capsule (40 mg total) by mouth daily. 10/13/16  Yes Sowles, Drue Stager, MD  OXYGEN Inhale 2 L into the lungs.   Yes [provider]  tiotropium (SPIRIVA) 18 MCG inhalation capsule Place into inhaler and inhale. 09/14/16 09/14/17 Yes [provider]  tiZANidine (ZANAFLEX) 4 MG tablet TAKE 1 TABLET (4 MG TOTAL) BY MOUTH 3 (THREE) TIMES DAILY. 01/06/17  Yes Sowles, Drue Stager, MD  venlafaxine XR (EFFEXOR-XR) 150 MG 24 hr capsule Take 1 capsule (150 mg total) by mouth daily with breakfast. 10/13/16  Yes Sowles, Drue Stager, MD  fenofibrate (TRICOR) 48 MG tablet Take 1 tablet (48 mg total) by mouth daily. 10/13/16 12/22/16  Steele Sizer, MD    Family History  Problem Relation Age of Onset  . Heart attack Mother   . Breast cancer Maternal Aunt      Social History  Substance Use Topics  . Smoking status: Former Smoker    Packs/day: 1.00    Years: 32.00    Types: Cigarettes    Start date: 08/10/1963    Quit date: 01/15/2006  . Smokeless tobacco: Never Used  . Alcohol use No    Allergies as of 01/24/2017 - Review Complete 01/24/2017  Allergen Reaction Noted  . Dapsone Other (See Comments) 09/24/2016  . Sulfa antibiotics Rash 09/18/2012    Review of Systems:    All systems reviewed and negative except where noted in HPI.   Physical Exam:  BP (!) 145/82   Pulse 81   Temp 98.4 F (36.9 C) (Oral)   Ht 5\' 4"  (1.626 m)   Wt 185 lb (83.9 kg)   LMP 03/04/2002 (Approximate)   BMI 31.76 kg/m  Patient's last menstrual period was 03/04/2002 (approximate). Psych:  Alert and cooperative. Normal mood and affect. General:   Alert,  Well-developed, well-nourished, pleasant and cooperative in NAD Head:  Normocephalic and atraumatic. Eyes:  Sclera clear, no icterus.   Conjunctiva pink. Ears:  Normal auditory acuity. Nose:  No deformity, discharge, or lesions. Mouth:  No deformity or lesions,oropharynx pink & moist. Neck:  Supple; no  masses or thyromegaly. Lungs:  Decreased breath sounds bilaterally.  The patient is on home oxygen. Heart:  Regular rate and rhythm; no murmurs, clicks, rubs, or gallops. Abdomen:  Normal bowel sounds.  No bruits.  Soft, Tenderness to palpation of the right upper quadrant. and non-distended without masses, hepatosplenomegaly or hernias noted.  No guarding or rebound tenderness.  Negative Carnett sign.   Rectal:  Deferred.  Msk:  Symmetrical without gross deformities.  Good, equal movement & strength bilaterally. Pulses:  Normal pulses noted. Extremities:  No clubbing or edema.  No cyanosis. Neurologic:  Alert and oriented x3;  grossly normal neurologically. Skin:  Intact without significant lesions or rashes.  No jaundice. Lymph Nodes:  No significant cervical adenopathy. Psych:  Alert and cooperative. Normal mood and affect.  Imaging Studies: No results found.  Assessment and Plan:   Judy Allen is a 61 y.o. y/o female who comes in with an elevated ALT of 30 with all of the liver enzymes being normal.  The patient did have right upper quadrant pain on palpation over her gallbladder.  The patient has been told to try and lose weight to decrease the amount of fat in her liver.  The patient will also be set up for a gallbladder emptying study due to the tender right upper quadrant on palpation.  The patient has been explained the plan and agrees with it.  Lucilla Lame, MD. [facility]al   Note: This dictation was prepared with Dragon dictation along with smaller phrase technology. Any transcriptional errors that result from this process are unintentional.

## 2017-01-28 ENCOUNTER — Telehealth: Payer: Self-pay

## 2017-01-28 NOTE — Telephone Encounter (Signed)
-----   Message from Lucilla Lame, MD sent at 01/25/2017 11:57 AM EDT ----- Let the patient know the blood test was negative.

## 2017-01-28 NOTE — Telephone Encounter (Signed)
Pt notified of lab results

## 2017-02-01 ENCOUNTER — Encounter: Payer: Self-pay | Admitting: Family Medicine

## 2017-02-01 ENCOUNTER — Ambulatory Visit (INDEPENDENT_AMBULATORY_CARE_PROVIDER_SITE_OTHER): Payer: Medicaid Other | Admitting: Family Medicine

## 2017-02-01 VITALS — BP 126/68 | HR 89 | Temp 97.8°F | Resp 16 | Ht 64.0 in | Wt 183.2 lb

## 2017-02-01 DIAGNOSIS — R87610 Atypical squamous cells of undetermined significance on cytologic smear of cervix (ASC-US): Secondary | ICD-10-CM | POA: Diagnosis not present

## 2017-02-01 DIAGNOSIS — C210 Malignant neoplasm of anus, unspecified: Secondary | ICD-10-CM | POA: Diagnosis not present

## 2017-02-01 NOTE — Progress Notes (Signed)
Name: Judy Allen   MRN: [id number]    DOB: Feb 26, 1956   Date:02/01/2017       Progress Note  Subjective  Chief Complaint  Chief Complaint  Patient presents with  . Gynecologic Exam    pap   . Abnormal Pap Smear    HPI  Abnormal pap smear: she had ASCUS 04/14/2016, diagnosed with anal cancer 03/09/2016. S/p radiation and chemotherapy, she still has anal lesion, but no longer painful. She came in today to have repeat pap smear, she did not go to GYN. She is post-menopausal since 2003. Not sexually active, denies vaginal bleeding. Not due for CPE until August   Patient Active Problem List   Diagnosis Date Noted  . Gallstones 12/22/2016  . Fatty liver 12/22/2016  . Chronic respiratory failure with hypoxia, on home oxygen therapy ([place]) 07/28/2016  . Centrilobular emphysema ([place]) 07/14/2016  . Abnormal Pap smear of cervix 04/14/2016  . Anal squamous cell carcinoma ([place]) 03/09/2016  . Metastatic squamous cell carcinoma ([place]) 02/25/2016  . Non-thrombocytopenic purpura ([place]) 01/12/2016  . Chronic constipation 10/16/2015  . History of fusion of cervical spine 04/02/2015  . Benign hypertension 01/16/2015  . Chronic cervical pain 01/16/2015  . CN (constipation) 01/16/2015  . Gastric reflux 01/16/2015  . Hypertriglyceridemia 01/16/2015  . Edema leg 01/16/2015  . Chronic recurrent major depressive disorder ([place]) 01/16/2015  . Dysmetabolic syndrome 14/78/2956  . Obstructive apnea 01/16/2015  . Psoriasis 01/16/2015  . Allergic rhinitis 01/16/2015  . Bursitis, trochanteric 01/16/2015  . GAD (generalized anxiety disorder) 01/16/2015  . Tachycardia, paroxysmal [facility])     Past Surgical History:  Procedure Laterality Date  . ANTERIOR FUSION LUMBAR SPINE    . CARDIAC CATHETERIZATION  2009?    [facility];[place]  . CARDIAC CATHETERIZATION  05/2012   [facility]: Mild hypertension 37/12 with a mean pressure of 21 mm mercury  . COLONOSCOPY  2008  . herniated disc repair    . MASS EXCISION Left  02/23/2016   Procedure: EXCISION MASS;  Surgeon: Christene Lye, MD;  Location: [facility] ORS;  Service: General;  Laterality: Left;  . NECK SURGERY    . PORTACATH PLACEMENT Left 03/12/2016   Procedure: INSERTION PORT-A-CATH;  Surgeon: Christene Lye, MD;  Location: [facility] ORS;  Service: General;  Laterality: Left;  . tonsillectomy  1959   history  . TUBAL LIGATION     s/p    Family History  Problem Relation Age of Onset  . Heart attack Mother   . Breast cancer Maternal Aunt     Social History   Social History  . Marital status: Married    Spouse name: N/A  . Number of children: N/A  . Years of education: N/A   Occupational History  . Not on file.   Social History Main Topics  . Smoking status: Former Smoker    Packs/day: 1.00    Years: 32.00    Types: Cigarettes    Start date: 08/10/1963    Quit date: 01/15/2006  . Smokeless tobacco: Never Used  . Alcohol use No  . Drug use: No  . Sexual activity: No   Other Topics Concern  . Not on file   Social History Narrative  . No narrative on file     Current Outpatient Prescriptions:  .  albuterol (PROVENTIL) (2.5 MG/3ML) 0.083% nebulizer solution, INHALE 3 ML EVERY 6 HOURS BY NEBULIZER AS NEEDED FOR WHEEZING AND SHORTNESS OF BREATH, Disp: , Rfl: 4 .  alclomethasone (ACLOVATE) 0.05 % cream, 1(ONE)  APPLICATION(S) TOPICAL EVERY DAY, Disp: , Rfl: 1 .  ALPRAZolam (XANAX XR) 1 MG 24 hr tablet, Take 1 tablet (1 mg total) by mouth every morning. Fill June 6th and monthly thereafter, Disp: 30 tablet, Rfl: 1 .  clobetasol cream (TEMOVATE) 0.05 %, Apply topically 2 (two) times daily., Disp: 60 g, Rfl: 2 .  desonide (DESOWEN) 0.05 % cream, Apply topically 2 (two) times daily. (Patient taking differently: Apply topically 2 (two) times daily. FACE AND PRIVATE AREA), Disp: 60 g, Rfl: 2 .  divalproex (DEPAKOTE) 250 MG DR tablet, Take 1 tablet (250 mg total) by mouth at bedtime., Disp: 30 tablet, Rfl: 5 .  fenofibrate (TRICOR) 48 MG  tablet, Take 1 tablet (48 mg total) by mouth daily., Disp: 30 tablet, Rfl: 5 .  fenofibrate (TRICOR) 48 MG tablet, TAKE 1 TABLET (48 MG TOTAL) BY MOUTH DAILY., Disp: , Rfl: 5 .  gabapentin (NEURONTIN) 300 MG capsule, TAKE 1 CAPSULE TWICE A DAY, Disp: 180 capsule, Rfl: 1 .  hydrochlorothiazide (HYDRODIURIL) 12.5 MG tablet, Take 1 tablet (12.5 mg total) by mouth daily. (Patient taking differently: Take 12.5 mg by mouth as needed. ), Disp: 30 tablet, Rfl: 2 .  HYDROcodone-acetaminophen (NORCO/VICODIN) 5-325 MG tablet, Take 1 tablet by mouth every 6 (six) hours as needed for moderate pain., Disp: 30 tablet, Rfl: 0 .  HYDROcodone-acetaminophen (NORCO/VICODIN) 5-325 MG tablet, Take 1 tablet by mouth 2 (two) times daily before a meal., Disp: 60 tablet, Rfl: 0 .  metoprolol succinate (TOPROL-XL) 50 MG 24 hr tablet, Take 1 tablet (50 mg total) by mouth daily., Disp: 30 tablet, Rfl: 5 .  omeprazole (PRILOSEC) 40 MG capsule, Take 1 capsule (40 mg total) by mouth daily., Disp: 30 capsule, Rfl: 5 .  OXYGEN, Inhale 2 L into the lungs., Disp: , Rfl:  .  tiotropium (SPIRIVA) 18 MCG inhalation capsule, Place into inhaler and inhale., Disp: , Rfl:  .  tiZANidine (ZANAFLEX) 4 MG tablet, TAKE 1 TABLET (4 MG TOTAL) BY MOUTH 3 (THREE) TIMES DAILY., Disp: 90 tablet, Rfl: 2 .  venlafaxine XR (EFFEXOR-XR) 150 MG 24 hr capsule, Take 1 capsule (150 mg total) by mouth daily with breakfast., Disp: 30 capsule, Rfl: 5  Allergies  Allergen Reactions  . Dapsone Other (See Comments)    Hypoxemia  . Sulfa Antibiotics Rash     ROS  Ten systems reviewed and is negative except as mentioned in HPI  She feels tired  Objective  Vitals:   02/01/17 1019  BP: 126/68  Pulse: 89  Resp: 16  Temp: 97.8 F (36.6 C)  SpO2: 92%  Weight: 183 lb 4 oz (83.1 kg)  Height: 5\' 4"  (1.626 m)    Body mass index is 31.45 kg/m.  Physical Exam  Constitutional: Patient appears well-developed and well-nourished. Obese  No distress.   HEENT: head atraumatic, normocephalic, pupils equal and reactive to light,  neck supple, throat within normal limits Cardiovascular: Normal rate, regular rhythm and normal heart sounds.  No murmur heard. No BLE edema. Pulmonary/Chest: Effort normal and breath sounds normal. No respiratory distress. Gyn: vulva has some erythema, rectal - skin tag. She has vaginal atrophy and very tender bimanual exam and speculum caused some vaginal wall bleeding, unable to locate cervix. Pap smear not collected, we will refer her to gyn  Abdominal: Soft.  There is no tenderness. Psychiatric: Patient has a normal mood and affect. behavior is normal. Judgment and thought content normal.  Recent Results (from the past 2160 hour(s))  Alpha-1-antitrypsin  Status: None   Collection Time: 01/24/17  4:41 PM  Result Value Ref Range   A-1 Antitrypsin 128 90 - 200 mg/dL     PHQ2/9: Depression screen [facility] 2/9 12/22/2016 10/13/2016 09/24/2016 07/14/2016 06/01/2016  Decreased Interest 0 1 0 3 1  Down, Depressed, Hopeless 0 3 1 3 3   PHQ - 2 Score 0 4 1 6 4   Altered sleeping - 1 - 3 0  Tired, decreased energy - 3 - 3 3  Change in appetite - 1 - 0 3  Feeling bad or failure about yourself  - 1 - 3 0  Trouble concentrating - 0 - 0 0  Moving slowly or fidgety/restless - 0 - 3 0  Suicidal thoughts - 0 - 0 0  PHQ-9 Score - 10 - 18 10  Difficult doing work/chores - Somewhat difficult - Not difficult at all Somewhat difficult     Fall Risk: Fall Risk  12/22/2016 10/13/2016 09/24/2016 07/14/2016 06/01/2016  Falls in the past year? No Yes Yes Yes Yes  Number falls in past yr: - 1 1 1 1   Injury with Fall? - No No No No  Follow up - - - - Falls evaluation completed    Assessment & Plan  1. Atypical squamous cells of undetermined significance on cytologic smear of cervix (ASC-US)  Unable to collect specimen, refer to gyn She has anal cancer, under [facility] care

## 2017-02-02 ENCOUNTER — Encounter: Payer: Self-pay | Admitting: Family Medicine

## 2017-02-03 ENCOUNTER — Encounter: Payer: Self-pay | Admitting: Family Medicine

## 2017-02-07 ENCOUNTER — Ambulatory Visit
Admission: RE | Admit: 2017-02-07 | Discharge: 2017-02-07 | Disposition: A | Discharge status: Home / Self Care | Payer: Medicaid Other | Source: Ambulatory Visit | Attending: Family Medicine | Admitting: Family Medicine

## 2017-02-07 DIAGNOSIS — Z1231 Encounter for screening mammogram for malignant neoplasm of breast: Secondary | ICD-10-CM | POA: Diagnosis present

## 2017-02-07 HISTORY — DX: Malignant neoplasm of colon, unspecified: C18.9

## 2017-02-07 HISTORY — DX: Personal history of irradiation: Z92.3

## 2017-02-07 HISTORY — DX: Personal history of antineoplastic chemotherapy: Z92.21

## 2017-02-08 ENCOUNTER — Ambulatory Visit: Payer: Medicaid Other

## 2017-02-12 ENCOUNTER — Encounter: Payer: Self-pay | Admitting: Family Medicine

## 2017-02-14 ENCOUNTER — Encounter: Payer: Self-pay | Admitting: Family Medicine

## 2017-02-14 ENCOUNTER — Other Ambulatory Visit: Payer: Self-pay | Admitting: Family Medicine

## 2017-02-14 DIAGNOSIS — G8929 Other chronic pain: Secondary | ICD-10-CM

## 2017-02-14 DIAGNOSIS — Z981 Arthrodesis status: Secondary | ICD-10-CM

## 2017-02-14 DIAGNOSIS — M542 Cervicalgia: Secondary | ICD-10-CM

## 2017-02-24 ENCOUNTER — Ambulatory Visit (INDEPENDENT_AMBULATORY_CARE_PROVIDER_SITE_OTHER): Payer: Medicaid Other | Admitting: Obstetrics and Gynecology

## 2017-02-24 ENCOUNTER — Encounter: Payer: Self-pay | Admitting: Obstetrics and Gynecology

## 2017-02-24 VITALS — BP 144/83 | HR 83 | Ht 64.0 in | Wt 184.5 lb

## 2017-02-24 DIAGNOSIS — R369 Urethral discharge, unspecified: Secondary | ICD-10-CM | POA: Diagnosis not present

## 2017-02-24 DIAGNOSIS — R87610 Atypical squamous cells of undetermined significance on cytologic smear of cervix (ASC-US): Secondary | ICD-10-CM | POA: Diagnosis not present

## 2017-02-24 NOTE — Progress Notes (Signed)
GYNECOLOGY PROGRESS NOTE  Subjective:    Patient ID: Judy Allen, female    DOB: January 14, 1956, 61 y.o.   MRN: [id number]  HPI  Patient is a 61 y.o. G11P4 postmenopausal female who presents as a referral from [facility] for abnormal pap smear.  Last pap smear was 04/08/2016, ASCUS HR HPV neg. Patient denies h/o abnormal pap smears in the past. Of note, patient has a h/o anal cancer, s/p radiation therapy, completed in October 2017.  Patient states that when her PCP attempted to perform a speculum exam and repeat pap smear, she was unable to visualize the patient's cervix.   Past Medical History:  Diagnosis Date  . Anemia    H/O  . Anxiety   . Arthritis   . Colon cancer ([place]) 2017   anal ca/chemo and rad  . Complication of anesthesia   . Constipation   . Dysrhythmia    TACHYCARDIA-WELL CONTROLLED ON METOPROLO  . GERD (gastroesophageal reflux disease)   . H/O allergic rhinitis   . Heart murmur   . Hemorrhoid   . Hyperlipidemia   . Hypertension   . Neck pain, chronic   . Neuritis   . Ovarian failure   . Personal history of chemotherapy   . Personal history of radiation therapy   . PONV (postoperative nausea and vomiting)   . Proteinuria   . Psoriasis   . Sleep apnea 09/12/2016   8cm H2O with 21pm oxygen mask: [place] size Medium. Heated humidifier for nasal dryness.  . Tachycardia, paroxysmal ([place])   . Urinary incontinence     Family History  Problem Relation Age of Onset  . Heart attack Mother   . Breast cancer Maternal Aunt     Past Surgical History:  Procedure Laterality Date  . ANTERIOR FUSION LUMBAR SPINE    . CARDIAC CATHETERIZATION  2009?    [facility]  . CARDIAC CATHETERIZATION  05/2012   [facility]: Mild hypertension 37/12 with a mean pressure of 21 mm mercury  . COLONOSCOPY  2008  . herniated disc repair    . MASS EXCISION Left 02/23/2016   Procedure: EXCISION MASS;  Surgeon: Christene Lye, MD;  Location: [facility] ORS;  Service: General;   Laterality: Left;  . NECK SURGERY    . PORTACATH PLACEMENT Left 03/12/2016   Procedure: INSERTION PORT-A-CATH;  Surgeon: Christene Lye, MD;  Location: [facility] ORS;  Service: General;  Laterality: Left;  . tonsillectomy  1959   history  . TUBAL LIGATION     s/p    Social History   Social History  . Marital status: Married    Spouse name: N/A  . Number of children: N/A  . Years of education: N/A   Occupational History  . Not on file.   Social History Main Topics  . Smoking status: Former Smoker    Packs/day: 1.00    Years: 32.00    Types: Cigarettes    Start date: 08/10/1963    Quit date: 01/15/2006  . Smokeless tobacco: Never Used  . Alcohol use No  . Drug use: No  . Sexual activity: No   Other Topics Concern  . Not on file   Social History Narrative  . No narrative on file    Current Outpatient Prescriptions on File Prior to Visit  Medication Sig Dispense Refill  . albuterol (PROVENTIL) (2.5 MG/3ML) 0.083% nebulizer solution INHALE 3 ML EVERY 6 HOURS BY NEBULIZER AS NEEDED FOR WHEEZING AND SHORTNESS OF BREATH  4  .  alclomethasone (ACLOVATE) 0.05 % cream 1(ONE) APPLICATION(S) TOPICAL EVERY DAY  1  . ALPRAZolam (XANAX XR) 1 MG 24 hr tablet Take 1 tablet (1 mg total) by mouth every morning. Fill June 6th and monthly thereafter 30 tablet 1  . clobetasol cream (TEMOVATE) 0.05 % Apply topically 2 (two) times daily. 60 g 2  . desonide (DESOWEN) 0.05 % cream Apply topically 2 (two) times daily. (Patient taking differently: Apply topically 2 (two) times daily. FACE AND PRIVATE AREA) 60 g 2  . divalproex (DEPAKOTE) 250 MG DR tablet Take 1 tablet (250 mg total) by mouth at bedtime. 30 tablet 5  . fenofibrate (TRICOR) 48 MG tablet TAKE 1 TABLET (48 MG TOTAL) BY MOUTH DAILY.  5  . gabapentin (NEURONTIN) 300 MG capsule TAKE 1 CAPSULE TWICE A DAY 180 capsule 1  . hydrochlorothiazide (HYDRODIURIL) 12.5 MG tablet Take 1 tablet (12.5 mg total) by mouth daily. (Patient taking  differently: Take 12.5 mg by mouth as needed. ) 30 tablet 2  . HYDROcodone-acetaminophen (NORCO/VICODIN) 5-325 MG tablet Take 1 tablet by mouth every 6 (six) hours as needed for moderate pain. 30 tablet 0  . HYDROcodone-acetaminophen (NORCO/VICODIN) 5-325 MG tablet Take 1 tablet by mouth 2 (two) times daily before a meal. 60 tablet 0  . metoprolol succinate (TOPROL-XL) 50 MG 24 hr tablet Take 1 tablet (50 mg total) by mouth daily. 30 tablet 5  . omeprazole (PRILOSEC) 40 MG capsule Take 1 capsule (40 mg total) by mouth daily. 30 capsule 5  . OXYGEN Inhale 2 L into the lungs.    . tiotropium (SPIRIVA) 18 MCG inhalation capsule Place into inhaler and inhale.    Marland Kitchen tiZANidine (ZANAFLEX) 4 MG tablet TAKE 1 TABLET (4 MG TOTAL) BY MOUTH 3 (THREE) TIMES DAILY. 90 tablet 2  . venlafaxine XR (EFFEXOR-XR) 150 MG 24 hr capsule Take 1 capsule (150 mg total) by mouth daily with breakfast. 30 capsule 5  . fenofibrate (TRICOR) 48 MG tablet Take 1 tablet (48 mg total) by mouth daily. 30 tablet 5   No current facility-administered medications on file prior to visit.     Allergies  Allergen Reactions  . Dapsone Other (See Comments)    Hypoxemia  . Sulfa Antibiotics Rash     Review of Systems A comprehensive review of systems was negative except for: Genitourinary: positive for occasional urinary "drainage".  Notes it is not urine per se, but sometimes looks like smalll amount of pus or light drainage with mild odor.  Has had urine tested and ruled out for UTI's before.    Objective:   Blood pressure (!) 144/83, pulse 83, height 5' 4"  (1.626 m), weight 184 lb 8 oz (83.7 kg), last menstrual period 03/04/2002. Body mass index is 31.67 kg/m.  General appearance: alert and no distress, wearing [place] oxygen Female genitalia:   Bladder no bladder distention noted  Urethra: normal appearing urethra with no masses, tenderness or lesions and mildly atrophic. No discharge present.   Vulva: vulvar excoriations present  bilaterally on labia majora (post-radiation affects)  Vagina: atrophic and somewhat stenotic vagina due to h/o radiation.    Cervix: Barely able to visualize cervix even with smallest speculum, appears flushed to vaginal wall. No apparent lesions. Cervix partially palpable on bimanual exam.   Uterus: difficult to palpate due to body habitus, but does not appear to be enlarged.   Adnexa: normal adnexa in size, nontender and no masses  Rectal: not indicated   Assessment:   ASCUS HR HPV  neg pap  Urethral drainage  Plan:   - ASCUS HR HPV neg pap smear.  Discussed with patient that based on ASCCP guidelines, she can continue routine screening (q 3 years) due to negative HPV status.  She is at low risk for cervical dysplasia. Patient desires pap smear to be repeated despite recommendations.  I advised that she may receive a bill for testing, which she notes she is ok with.  States she just wants to make sure nothing else is going on in light of her recent h/o anal cancer.  Pap smear performed today at patient's request.  - Urethral drainage per patient, and is not urine. Notes that she has had negative urine tests in the past.  Is possible that patient may have a small urethral diverticulum (although nothing palpable on today's exam), or possible small vesico-vaginal fistula due to h/o radation. Advised that as long as symptoms are not bothersome and interfering with daily life (patient notes it is intermittent, with small amounts of drainage, denies pain or bloody discharge, just has to wear a pantyliner), that no intervention is required.  To continue monitoring symptoms.  If it worsens, she may benefit from Urology consult.     Rubie Maid, MD [facility]

## 2017-02-28 ENCOUNTER — Encounter: Payer: Self-pay | Admitting: Family Medicine

## 2017-03-02 ENCOUNTER — Encounter: Payer: Self-pay | Admitting: Family Medicine

## 2017-03-02 ENCOUNTER — Ambulatory Visit (INDEPENDENT_AMBULATORY_CARE_PROVIDER_SITE_OTHER): Payer: Medicaid Other | Admitting: Family Medicine

## 2017-03-02 VITALS — BP 132/80 | HR 82 | Temp 97.7°F | Resp 16 | Ht 64.0 in | Wt 180.5 lb

## 2017-03-02 DIAGNOSIS — M542 Cervicalgia: Secondary | ICD-10-CM

## 2017-03-02 DIAGNOSIS — F411 Generalized anxiety disorder: Secondary | ICD-10-CM

## 2017-03-02 DIAGNOSIS — Z85048 Personal history of other malignant neoplasm of rectum, rectosigmoid junction, and anus: Secondary | ICD-10-CM | POA: Diagnosis not present

## 2017-03-02 DIAGNOSIS — Z9981 Dependence on supplemental oxygen: Secondary | ICD-10-CM

## 2017-03-02 DIAGNOSIS — G8929 Other chronic pain: Secondary | ICD-10-CM

## 2017-03-02 DIAGNOSIS — I10 Essential (primary) hypertension: Secondary | ICD-10-CM

## 2017-03-02 DIAGNOSIS — I479 Paroxysmal tachycardia, unspecified: Secondary | ICD-10-CM | POA: Diagnosis not present

## 2017-03-02 DIAGNOSIS — E781 Pure hyperglyceridemia: Secondary | ICD-10-CM

## 2017-03-02 DIAGNOSIS — K760 Fatty (change of) liver, not elsewhere classified: Secondary | ICD-10-CM | POA: Diagnosis not present

## 2017-03-02 DIAGNOSIS — F339 Major depressive disorder, recurrent, unspecified: Secondary | ICD-10-CM

## 2017-03-02 DIAGNOSIS — K219 Gastro-esophageal reflux disease without esophagitis: Secondary | ICD-10-CM

## 2017-03-02 DIAGNOSIS — D649 Anemia, unspecified: Secondary | ICD-10-CM | POA: Diagnosis not present

## 2017-03-02 DIAGNOSIS — J432 Centrilobular emphysema: Secondary | ICD-10-CM | POA: Diagnosis not present

## 2017-03-02 DIAGNOSIS — E8881 Metabolic syndrome: Secondary | ICD-10-CM

## 2017-03-02 LAB — PAP IG AND HPV HIGH-RISK
HPV, high-risk: NEGATIVE
PAP Smear Comment: 0

## 2017-03-02 MED ORDER — VENLAFAXINE HCL ER 150 MG PO CP24
150.0000 mg | ORAL_CAPSULE | Freq: Every day | ORAL | 5 refills | Status: DC
Start: 2017-03-02 — End: 2017-06-06

## 2017-03-02 MED ORDER — HYDROCODONE-ACETAMINOPHEN 5-325 MG PO TABS: 1.0000 | ORAL_TABLET | Freq: Two times a day (BID) | ORAL | 0 refills | Status: DC

## 2017-03-02 MED ORDER — HYDROCODONE-ACETAMINOPHEN 5-325 MG PO TABS: 1.0000 | ORAL_TABLET | Freq: Two times a day (BID) | ORAL | 0 refills | Status: DC | PRN

## 2017-03-02 MED ORDER — DIVALPROEX SODIUM 250 MG PO DR TAB: 250.0000 mg | DELAYED_RELEASE_TABLET | Freq: Every day | ORAL | 5 refills | Status: DC

## 2017-03-02 MED ORDER — ALPRAZOLAM ER 1 MG PO TB24: 1.0000 mg | ORAL_TABLET | ORAL | 1 refills | Status: DC

## 2017-03-02 MED ORDER — METOPROLOL SUCCINATE ER 50 MG PO TB24: 50.0000 mg | ORAL_TABLET | Freq: Every day | ORAL | 5 refills | Status: DC

## 2017-03-02 MED ORDER — HYDROCODONE-ACETAMINOPHEN 5-325 MG PO TABS: 1.0000 | ORAL_TABLET | Freq: Four times a day (QID) | ORAL | 0 refills | Status: DC | PRN

## 2017-03-02 MED ORDER — GABAPENTIN 300 MG PO CAPS: 300.0000 mg | ORAL_CAPSULE | Freq: Two times a day (BID) | ORAL | 1 refills | Status: DC

## 2017-03-02 MED ORDER — OMEPRAZOLE 40 MG PO CPDR: 40.0000 mg | DELAYED_RELEASE_CAPSULE | Freq: Every day | ORAL | 5 refills | Status: DC

## 2017-03-02 NOTE — Progress Notes (Signed)
Name: Judy Allen   MRN: [id number]    DOB: 24-Jul-1956   Date:03/02/2017       Progress Note  Subjective  Chief Complaint  Chief Complaint  Patient presents with  . Abnormal Pap Smear    HPI  HTN: bp is back to baseline and she has been taking  Toprol daily but HCTZ only prn,  no chest pain, she has occasional palpitation, noticing dizziness lately, with or without movement , brief episodes.   RUQ pain: intermittent RUQ pain, not sure of triggering factors, but PET scan done in April showed gallstones, but further testing not approved by insurance.   Neck pain: neck pain is getting worse, and having radiculitis left arm again, she was sent back to [facility] 2017. She had a steroid injection injection for her facet arthropathy but there was no improvement of the pain. She was advised to try otc nsaid's and continue hydrocodone. Pain is described as sharp and throbbing pain, sometimes burning, currently a 10/10, described as sharp and tingling sensation.   Chronic low back pain: she was seen by [facility], history of lumbar spine surgery and had injections by Dr. Arneta Cliche in the past with improvement of symptoms, no radiculitis, worse when bending forward. Taking hydrocodone and also muscle relaxer and last drug screen was done 05/2016 , [place] database checked 03/02/2017  and I am the only rx of controlled medications  GAD/Depression Major: currently on Effexor and Depakote because Abilify and Cymbalta was too expenisve- no longer could afford. She was doing better after anal cancer therapy, however pain has increased on neck and is getting more depressed again.  She denies suicidal thoughts or ideation. She states she has noticed increase in anhedonia, crying spells daily again, appetite She is still able to pay bills, help daughter cleaning the house and cooking.   Anal Squamous cell Carcinoma: seeing oncologist and radiation oncologist at [facility].  PET scan was reviewed with patient, she has follow up at [facility] every 3 months for 2 years  Emphysema: she used to smoke for many years, but quit almost 10 years ago, but was admitted to [facility] in October, had CT chest to rule out PE and was found to have centrilobular emphysema and also hypoxemia, she was sent home on 2 liters of oxygen. She has follow up soon with Dr. Mortimer Fries  OSA ;diagnosed at [facility], on 8 cm H2 O pressure, but not compliant with CPAP but she has not been compliant, she states it was bothering her sleep and pulse ox was still low during the night, she states they are trying to increase pressure   Dysmetabolic syndrome: avoiding sweets, losing a little weight,  no polyphagia, polydipsia but has polyuria, occasionally has dysuria - secondary to radiation, checked for UTI and negative   Hypertriglyceridemia: taking tricor and denies side effects, no chest pain  Abnormal pap smear: seen by Dr. Marcelline Mates and had repeat test done recently    Patient Active Problem List   Diagnosis Date Noted  . Gallstones 12/22/2016  . Fatty liver 12/22/2016  . Chronic respiratory failure with hypoxia, on home oxygen therapy ([place]) 07/28/2016  . Centrilobular emphysema ([place]) 07/14/2016  . Abnormal Pap smear of cervix 04/14/2016  . Anal squamous cell carcinoma ([place]) 03/09/2016  . Metastatic squamous cell carcinoma ([place]) 02/25/2016  . Non-thrombocytopenic purpura ([place]) 01/12/2016  . Chronic constipation 10/16/2015  . History of fusion of cervical spine 04/02/2015  . Benign hypertension 01/16/2015  . Chronic  cervical pain 01/16/2015  . CN (constipation) 01/16/2015  . Gastric reflux 01/16/2015  . Hypertriglyceridemia 01/16/2015  . Edema leg 01/16/2015  . Chronic recurrent major depressive disorder ([place]) 01/16/2015  . Dysmetabolic syndrome 40/34/7425  . Obstructive apnea 01/16/2015  . Psoriasis 01/16/2015  . Allergic rhinitis 01/16/2015  . Bursitis, trochanteric 01/16/2015  . GAD (generalized anxiety  disorder) 01/16/2015  . Tachycardia, paroxysmal [facility])     Past Surgical History:  Procedure Laterality Date  . ANTERIOR FUSION LUMBAR SPINE    . CARDIAC CATHETERIZATION  2009?    [facility];[place]  . CARDIAC CATHETERIZATION  05/2012   [facility]: Mild hypertension 37/12 with a mean pressure of 21 mm mercury  . COLONOSCOPY  2008  . herniated disc repair    . MASS EXCISION Left 02/23/2016   Procedure: EXCISION MASS;  Surgeon: Christene Lye, MD;  Location: [facility] ORS;  Service: General;  Laterality: Left;  . NECK SURGERY    . PORTACATH PLACEMENT Left 03/12/2016   Procedure: INSERTION PORT-A-CATH;  Surgeon: Christene Lye, MD;  Location: [facility] ORS;  Service: General;  Laterality: Left;  . tonsillectomy  1959   history  . TUBAL LIGATION     s/p    Family History  Problem Relation Age of Onset  . Heart attack Mother   . Breast cancer Maternal Aunt     Social History   Social History  . Marital status: Married    Spouse name: N/A  . Number of children: N/A  . Years of education: N/A   Occupational History  . Not on file.   Social History Main Topics  . Smoking status: Former Smoker    Packs/day: 1.00    Years: 32.00    Types: Cigarettes    Start date: 08/10/1963    Quit date: 01/15/2006  . Smokeless tobacco: Never Used  . Alcohol use No  . Drug use: No  . Sexual activity: No   Other Topics Concern  . Not on file   Social History Narrative  . No narrative on file     Current Outpatient Prescriptions:  .  albuterol (PROVENTIL) (2.5 MG/3ML) 0.083% nebulizer solution, INHALE 3 ML EVERY 6 HOURS BY NEBULIZER AS NEEDED FOR WHEEZING AND SHORTNESS OF BREATH, Disp: , Rfl: 4 .  alclomethasone (ACLOVATE) 0.05 % cream, 1(ONE) APPLICATION(S) TOPICAL EVERY DAY, Disp: , Rfl: 1 .  ALPRAZolam (XANAX XR) 1 MG 24 hr tablet, Take 1 tablet (1 mg total) by mouth every morning. Fill August 22nd and monthly thereafter, Disp: 30 tablet, Rfl: 1 .  clobetasol cream (TEMOVATE) 0.05 %, Apply  topically 2 (two) times daily., Disp: 60 g, Rfl: 2 .  [employer] B-12 1000 MCG TBCR, TAKE 1 TABLET (1,000 MCG TOTAL) BY MOUTH DAILY., Disp: , Rfl: 11 .  desonide (DESOWEN) 0.05 % cream, Apply topically 2 (two) times daily. (Patient taking differently: Apply topically 2 (two) times daily. FACE AND PRIVATE AREA), Disp: 60 g, Rfl: 2 .  divalproex (DEPAKOTE) 250 MG DR tablet, Take 1 tablet (250 mg total) by mouth at bedtime., Disp: 30 tablet, Rfl: 5 .  fenofibrate (TRICOR) 48 MG tablet, TAKE 1 TABLET (48 MG TOTAL) BY MOUTH DAILY., Disp: , Rfl: 5 .  gabapentin (NEURONTIN) 300 MG capsule, Take 1 capsule (300 mg total) by mouth 2 (two) times daily., Disp: 180 capsule, Rfl: 1 .  hydrochlorothiazide (HYDRODIURIL) 12.5 MG tablet, Take 1 tablet (12.5 mg total) by mouth daily. (Patient taking differently: Take 12.5 mg by mouth as needed. ),  Disp: 30 tablet, Rfl: 2 .  HYDROcodone-acetaminophen (NORCO) 5-325 MG tablet, Take 1 tablet by mouth 2 (two) times daily as needed for moderate pain., Disp: 60 tablet, Rfl: 0 .  HYDROcodone-acetaminophen (NORCO/VICODIN) 5-325 MG tablet, Take 1 tablet by mouth every 6 (six) hours as needed for moderate pain., Disp: 30 tablet, Rfl: 0 .  HYDROcodone-acetaminophen (NORCO/VICODIN) 5-325 MG tablet, Take 1 tablet by mouth 2 (two) times daily before a meal., Disp: 60 tablet, Rfl: 0 .  metoprolol succinate (TOPROL-XL) 50 MG 24 hr tablet, Take 1 tablet (50 mg total) by mouth daily., Disp: 30 tablet, Rfl: 5 .  omeprazole (PRILOSEC) 40 MG capsule, Take 1 capsule (40 mg total) by mouth daily., Disp: 30 capsule, Rfl: 5 .  OXYGEN, Inhale 2 L into the lungs., Disp: , Rfl:  .  tiotropium (SPIRIVA) 18 MCG inhalation capsule, Place into inhaler and inhale., Disp: , Rfl:  .  tiZANidine (ZANAFLEX) 4 MG tablet, TAKE 1 TABLET (4 MG TOTAL) BY MOUTH 3 (THREE) TIMES DAILY., Disp: 90 tablet, Rfl: 2 .  venlafaxine XR (EFFEXOR-XR) 150 MG 24 hr capsule, Take 1 capsule (150 mg total) by mouth daily with  breakfast., Disp: 30 capsule, Rfl: 5  Allergies  Allergen Reactions  . Dapsone Other (See Comments)    Hypoxemia  . Sulfa Antibiotics Rash     ROS  Constitutional: Negative for fever , no significant  weight change.  Respiratory: Negative for cough , positive for shortness of breath.   Cardiovascular: Negative for chest pain or palpitations.  Gastrointestinal: Positive for right upper abdominal pain ( seen by Dr. Durwin Reges but medicaid denied test) , no bowel changes.  Musculoskeletal: Negative for gait problem or joint swelling.  Skin: Positive  for rash ( senile purpura and psoriatic plaques under control with topical medication  Neurological: Positive for dizziness ( lightheaded) and intermittent  headache.  No other specific complaints in a complete review of systems (except as listed in HPI above).  Objective  Vitals:   03/02/17 1527 03/02/17 1556  BP: 132/80   Pulse: (!) 112 82  Resp: 16   Temp: 97.7 F (36.5 C)   SpO2: 95%   Weight: 180 lb 8 oz (81.9 kg)   Height: 5\' 4"  (1.626 m)     Body mass index is 30.98 kg/m.  Physical Exam  Constitutional: Patient appears well-developed and well-nourished. Obese  No distress.  HEENT: head atraumatic, normocephalic, pupils equal and reactive to light,  neck supple, throat within normal limits Cardiovascular: Normal rate, regular rhythm and normal heart sounds.  No murmur heard. No BLE edema. Pulmonary/Chest: Effort normal and breath sounds normal. No respiratory distress. Abdominal: Soft.  There is no tenderness. Psychiatric: Patient has a normal mood and affect. behavior is normal. Judgment and thought content normal.  Recent Results (from the past 2160 hour(s))  Alpha-1-antitrypsin     Status: None   Collection Time: 01/24/17  4:41 PM  Result Value Ref Range   A-1 Antitrypsin 128 90 - 200 mg/dL  Pap IG and HPV (high risk) DNA detection     Status: None   Collection Time: 02/24/17 11:02 AM  Result Value Ref Range    DIAGNOSIS: Comment     Comment: UNSATISFACTORY FOR EVALUATION.   Specimen adequacy: Comment     Comment: Specimen processed and examined, but unsatisfactory for evaluation of epithelial abnormality because of obscuring inflammatory exudate. Areas of partially obscuring blood are present.    Clinician Provided ICD10 Comment  Comment: R87.610   Performed by: Comment     Comment: Delma Officer, Cytotechnologist (ASCP)   QC reviewed by: Comment     Comment: Orlean Bradford, Supervisory Cytotechnologist (ASCP) Neomia Dear, Supervisory Cytotechnologist (ASCP)    PAP Smear Comment .    Note: Comment     Comment: The Pap smear is a screening test designed to aid in the detection of premalignant and malignant conditions of the uterine cervix.  It is not a diagnostic procedure and should not be used as the sole means of detecting cervical cancer.  Both false-positive and false-negative reports do occur.    Test Methodology Comment     Comment: This liquid based ThinPrep(R) pap test was screened with the use of an image guided system.    HPV, high-risk Negative Negative    Comment: This high-risk HPV test detects thirteen high-risk types (16/18/31/33/35/39/45/51/52/56/58/59/68) without differentiation.      PHQ2/9: Depression screen [facility] 2/9 12/22/2016 10/13/2016 09/24/2016 07/14/2016 06/01/2016  Decreased Interest 0 1 0 3 1  Down, Depressed, Hopeless 0 3 1 3 3   PHQ - 2 Score 0 4 1 6 4   Altered sleeping - 1 - 3 0  Tired, decreased energy - 3 - 3 3  Change in appetite - 1 - 0 3  Feeling bad or failure about yourself  - 1 - 3 0  Trouble concentrating - 0 - 0 0  Moving slowly or fidgety/restless - 0 - 3 0  Suicidal thoughts - 0 - 0 0  PHQ-9 Score - 10 - 18 10  Difficult doing work/chores - Somewhat difficult - Not difficult at all Somewhat difficult     Fall Risk: Fall Risk  12/22/2016 10/13/2016 09/24/2016 07/14/2016 06/01/2016  Falls in the past year? No Yes Yes Yes Yes  Number  falls in past yr: - 1 1 1 1   Injury with Fall? - No No No No  Follow up - - - - Falls evaluation completed     Assessment & Plan  1. GAD (generalized anxiety disorder)  She is still anxious, symptoms are stable - venlafaxine XR (EFFEXOR-XR) 150 MG 24 hr capsule; Take 1 capsule (150 mg total) by mouth daily with breakfast.  Dispense: 30 capsule; Refill: 5 - ALPRAZolam (XANAX XR) 1 MG 24 hr tablet; Take 1 tablet (1 mg total) by mouth every morning. Fill August 22nd and monthly thereafter  Dispense: 30 tablet; Refill: 1  2. Chronic recurrent major depressive disorder (HCC)  - venlafaxine XR (EFFEXOR-XR) 150 MG 24 hr capsule; Take 1 capsule (150 mg total) by mouth daily with breakfast.  Dispense: 30 capsule; Refill: 5 - divalproex (DEPAKOTE) 250 MG DR tablet; Take 1 tablet (250 mg total) by mouth at bedtime.  Dispense: 30 tablet; Refill: 5 Depression is worse, discussed referral to psychiatrist, but she states she thinks secondary to pain and wants to see pain clinic first  3. Gastroesophageal reflux disease without esophagitis  - omeprazole (PRILOSEC) 40 MG capsule; Take 1 capsule (40 mg total) by mouth daily.  Dispense: 30 capsule; Refill: 5  4. Benign hypertension  - metoprolol succinate (TOPROL-XL) 50 MG 24 hr tablet; Take 1 tablet (50 mg total) by mouth daily.  Dispense: 30 tablet; Refill: 5 - CO MPLETE METABOLIC PANEL WITH GFR  5. Tachycardia, paroxysmal (HCC)  Under control at this time - metoprolol succinate (TOPROL-XL) 50 MG 24 hr tablet; Take 1 tablet (50 mg total) by mouth daily.  Dispense: 30 tablet; Refill: 5  6. Chronic cervical  pain  She is asking to increase pain medication because of increase in neck pain, explained that I took over recommendation from neurosurgeon and I will change regiment once she sees Dr. Cyndy Freeze again and gets his recommendations. She states she has a balance at his office and will go back when she pays it off.  - HYDROcodone-acetaminophen  (NORCO/VICODIN) 5-325 MG tablet; Take 1 tablet by mouth every 6 (six) hours as needed for moderate pain.  Dispense: 60 tablet; Refill: 0 - HYDROcodone-acetaminophen (NORCO/VICODIN) 5-325 MG tablet; Take 1 tablet by mouth 2 (two) times daily before a meal.  Dispense: 60 tablet; Refill: 0 - HYDROcodone-acetaminophen (NORCO) 5-325 MG tablet; Take 1 tablet by mouth 2 (two) times daily as needed for moderate pain.  Dispense: 60 tablet; Refill: 0 - gabapentin (NEURONTIN) 300 MG capsule; Take 1 capsule (300 mg total) by mouth 2 (two) times daily.  Dispense: 180 capsule; Refill: 1  7. Dysmetabolic syndrome  - Hemoglobin A1c - Insulin, fasting  8. Oxygen dependent  Stable  9. Centrilobular emphysema (HCC)  Stable on 2 liters and Spiriva given by Dr. Patsy Baltimore  10. Anemia, unspecified type  - CBC with Differential/Platelet - Iron, TIBC and Ferritin Panel  11. History of anal cancer  T[facility] at [facility]  12. Fatty liver  Seen by Dr. Durwin Reges we will recheck labs  43. Hypertriglyceridemia  - Lipid panel

## 2017-03-03 ENCOUNTER — Encounter: Payer: Self-pay | Admitting: Family Medicine

## 2017-03-03 ENCOUNTER — Other Ambulatory Visit: Payer: Self-pay | Admitting: Family Medicine

## 2017-03-03 DIAGNOSIS — F339 Major depressive disorder, recurrent, unspecified: Secondary | ICD-10-CM

## 2017-03-03 LAB — CBC WITH DIFFERENTIAL/PLATELET
BASOS ABS: 55 {cells}/uL (ref 0–200)
Basophils Relative: 1 %
Eosinophils Absolute: 220 cells/uL (ref 15–500)
Eosinophils Relative: 4 %
HEMATOCRIT: 38 % (ref 35.0–45.0)
HEMOGLOBIN: 12.4 g/dL (ref 11.7–15.5)
LYMPHS ABS: 1045 {cells}/uL (ref 850–3900)
Lymphocytes Relative: 19 %
MCH: 29.2 pg (ref 27.0–33.0)
MCHC: 32.6 g/dL (ref 32.0–36.0)
MCV: 89.6 fL (ref 80.0–100.0)
MONO ABS: 440 {cells}/uL (ref 200–950)
MPV: 8.8 fL (ref 7.5–12.5)
Monocytes Relative: 8 %
NEUTROS ABS: 3740 {cells}/uL (ref 1500–7800)
NEUTROS PCT: 68 %
Platelets: 264 10*3/uL (ref 140–400)
RBC: 4.24 MIL/uL (ref 3.80–5.10)
RDW: 16.1 % — ABNORMAL HIGH (ref 11.0–15.0)
WBC: 5.5 10*3/uL (ref 3.8–10.8)

## 2017-03-03 LAB — LIPID PANEL
CHOLESTEROL: 246 mg/dL — AB (ref <200)
HDL: 30 mg/dL — ABNORMAL LOW (ref >50)
TRIGLYCERIDES: 606 mg/dL — AB (ref <150)
Total CHOL/HDL Ratio: 8.2 Ratio — ABNORMAL HIGH (ref <5.0)

## 2017-03-03 LAB — COMPLETE METABOLIC PANEL WITH GFR
ALT: 29 U/L (ref 6–29)
AST: 24 U/L (ref 10–35)
Albumin: 4.3 g/dL (ref 3.6–5.1)
Alkaline Phosphatase: 53 U/L (ref 33–130)
BUN: 12 mg/dL (ref 7–25)
CALCIUM: 9.6 mg/dL (ref 8.6–10.4)
CO2: 27 mmol/L (ref 20–31)
CREATININE: 1.05 mg/dL — AB (ref 0.50–0.99)
Chloride: 105 mmol/L (ref 98–110)
GFR, EST AFRICAN AMERICAN: 66 mL/min (ref >=60)
GFR, EST NON AFRICAN AMERICAN: 57 mL/min — AB (ref >=60)
Glucose, Bld: 101 mg/dL — ABNORMAL HIGH (ref 65–99)
POTASSIUM: 4.2 mmol/L (ref 3.5–5.3)
Sodium: 143 mmol/L (ref 135–146)
Total Bilirubin: 0.3 mg/dL (ref 0.2–1.2)
Total Protein: 6.9 g/dL (ref 6.1–8.1)

## 2017-03-03 MED ORDER — HYDROCODONE-ACETAMINOPHEN 5-325 MG PO TABS: 1.0000 | ORAL_TABLET | Freq: Four times a day (QID) | ORAL | 0 refills | Status: DC | PRN

## 2017-03-03 NOTE — Progress Notes (Unsigned)
Please contact patient today to talk to her. She is very depressed. I entered referral to psychiatrist

## 2017-03-04 ENCOUNTER — Encounter: Payer: Self-pay | Admitting: Family Medicine

## 2017-03-04 LAB — HEMOGLOBIN A1C
HEMOGLOBIN A1C: 5.7 % — AB (ref <5.7)
Mean Plasma Glucose: 117 mg/dL

## 2017-03-04 LAB — INSULIN, FASTING: INSULIN FASTING, SERUM: 18.7 u[IU]/mL (ref 2.0–19.6)

## 2017-03-04 LAB — IRON,TIBC AND FERRITIN PANEL
%SAT: 13 % (ref 11–50)
Ferritin: 88 ng/mL (ref 20–288)
IRON: 54 ug/dL (ref 45–160)
TIBC: 411 ug/dL (ref 250–450)

## 2017-03-04 NOTE — Progress Notes (Unsigned)
Spoke with patient and she states she is not much better. I advised her to go to [facility] to talk to someone today and tried to encourage her; verbalized that she is not alone. She can make it and we are here for her to support her. Informed Dr. Ancil Boozer and told asked we reach out to her later on to see if she when up to [place] to talk to the therapist.

## 2017-03-06 ENCOUNTER — Other Ambulatory Visit: Payer: Self-pay | Admitting: Family Medicine

## 2017-03-06 MED ORDER — OMEGA-3-ACID ETHYL ESTERS 1 G PO CAPS: 2.0000 g | ORAL_CAPSULE | Freq: Two times a day (BID) | ORAL | 5 refills | Status: DC

## 2017-03-10 ENCOUNTER — Encounter: Payer: Medicaid Other | Admitting: Obstetrics and Gynecology

## 2017-03-11 ENCOUNTER — Telehealth: Payer: Self-pay

## 2017-03-11 NOTE — Telephone Encounter (Signed)
Messaged Mrs. Trickel on 03/04/17 to check on her and inform her of [place] in [place] she wanted to try to get into sooner to see a therapist. Patient states her crying spells have ceased this week and now she is very snappy. But she has been trying to control her mood swings the best she can. She still has her group meeting with [place] on 03/15/17 and is going to keep that appointment.

## 2017-03-16 ENCOUNTER — Ambulatory Visit: Payer: Medicaid Other | Admitting: Family Medicine

## 2017-04-06 ENCOUNTER — Telehealth: Payer: Self-pay | Admitting: Family Medicine

## 2017-04-06 NOTE — Telephone Encounter (Signed)
Gave to Dr. Ancil Boozer in her green folder.

## 2017-04-06 NOTE — Progress Notes (Signed)
Patient can in today stating that she needed a note excusing her from jury duty.  Notes has been forwarded to Dr. Ancil Boozer for review.

## 2017-04-06 NOTE — Telephone Encounter (Signed)
FYI: Jury paperwork dropped off by patient on 04/06/17 and put in folder and sent to [employer]

## 2017-04-07 ENCOUNTER — Encounter: Payer: Self-pay | Admitting: Family Medicine

## 2017-04-08 NOTE — Telephone Encounter (Signed)
Pt came in on 04/08/17 and picked up form for jury duty

## 2017-04-26 ENCOUNTER — Other Ambulatory Visit: Payer: Self-pay | Admitting: Family Medicine

## 2017-04-26 DIAGNOSIS — M542 Cervicalgia: Principal | ICD-10-CM

## 2017-04-26 DIAGNOSIS — G8929 Other chronic pain: Secondary | ICD-10-CM

## 2017-04-26 NOTE — Telephone Encounter (Signed)
Patient requesting refill of T[facility]anidine to [facility].

## 2017-05-09 ENCOUNTER — Encounter: Payer: Self-pay | Admitting: Obstetrics and Gynecology

## 2017-05-09 ENCOUNTER — Ambulatory Visit (INDEPENDENT_AMBULATORY_CARE_PROVIDER_SITE_OTHER): Payer: Medicaid Other | Admitting: Obstetrics and Gynecology

## 2017-05-09 VITALS — BP 146/83 | HR 73 | Ht 64.0 in | Wt 183.5 lb

## 2017-05-09 DIAGNOSIS — R87615 Unsatisfactory cytologic smear of cervix: Secondary | ICD-10-CM | POA: Diagnosis not present

## 2017-05-09 DIAGNOSIS — J9611 Chronic respiratory failure with hypoxia: Secondary | ICD-10-CM | POA: Diagnosis not present

## 2017-05-09 DIAGNOSIS — I10 Essential (primary) hypertension: Secondary | ICD-10-CM

## 2017-05-09 DIAGNOSIS — E781 Pure hyperglyceridemia: Secondary | ICD-10-CM

## 2017-05-09 DIAGNOSIS — Z0000 Encounter for general adult medical examination without abnormal findings: Secondary | ICD-10-CM

## 2017-05-09 DIAGNOSIS — Z923 Personal history of irradiation: Secondary | ICD-10-CM | POA: Diagnosis not present

## 2017-05-09 DIAGNOSIS — R87610 Atypical squamous cells of undetermined significance on cytologic smear of cervix (ASC-US): Secondary | ICD-10-CM | POA: Diagnosis not present

## 2017-05-09 DIAGNOSIS — C210 Malignant neoplasm of anus, unspecified: Secondary | ICD-10-CM

## 2017-05-09 DIAGNOSIS — Z9981 Dependence on supplemental oxygen: Secondary | ICD-10-CM | POA: Diagnosis not present

## 2017-05-09 DIAGNOSIS — R369 Urethral discharge, unspecified: Secondary | ICD-10-CM

## 2017-05-09 DIAGNOSIS — Z01419 Encounter for gynecological examination (general) (routine) without abnormal findings: Secondary | ICD-10-CM

## 2017-05-09 NOTE — Progress Notes (Signed)
ANNUAL PREVENTATIVE CARE GYNECOLOGY  ENCOUNTER NOTE  Subjective:       Judy Allen is a 60 y.o. G50P4 female here for a routine annual gynecologic exam. She has a PMH significant for anal squamous cell carcinoma s/p radiation therapy in 2017. The patient is not sexually active. The patient is not taking hormone replacement therapy. Patient denies post-menopausal vaginal bleeding. The patient wears seatbelts: yes. The patient participates in regular exercise: no. Has the patient ever been transfused or tattooed?: no. The patient reports that there is not domestic violence in her life.  Current complaints: 1.  Continues to complain of urethral discharge with occasional foul smelling odor.  Noted this complaint several months ago during a visit, however was not as bothersome. Patient has had urine tested in the past with negative workup for UTI. Does not believe that the discharge is urine.    Gynecologic History Patient's last menstrual period was 03/04/2002 (approximate). Contraception: post menopausal status Last Pap: 03/2016. Results were: ASCUS HR HPV neg.  Attempted repeat at patient's request 02/24/2017 but inadequate specimen. Last mammogram: 02/07/2017. Results were: BI-RADS CATEGORY  2: Benign. Last Colonoscopy: 03/2016.  Results were: abnormal - 2 sessile polyps in sigmoid and transverse colon. Multiple diverticula. (performed at [facility])   Obstetric History OB History  Gravida Para Term Preterm AB Living  4 4       4   SAB TAB Ectopic Multiple Live Births          4    # Outcome Date GA Lbr Len/2nd Weight Sex Delivery Anes PTL Lv  4 Para           3 Para           2 Para           1 Para             Obstetric Comments  1st Menstrual Cycle:  12   1st Pregnancy:  16    Past Medical History:  Diagnosis Date  . Anemia    H/O  . Anxiety   . Arthritis   . Colon cancer ([place]) 2017   anal ca/chemo and rad  . Complication of anesthesia   . Constipation   . Dysrhythmia    TACHYCARDIA-WELL CONTROLLED ON METOPROLO  . GERD (gastroesophageal reflux disease)   . H/O allergic rhinitis   . Heart murmur   . Hemorrhoid   . Hyperlipidemia   . Hypertension   . Neck pain, chronic   . Neuritis   . Ovarian failure   . Personal history of chemotherapy   . Personal history of radiation therapy   . PONV (postoperative nausea and vomiting)   . Proteinuria   . Psoriasis   . Sleep apnea 09/12/2016   8cm H2O with 21pm oxygen mask: Eson size Medium. Heated humidifier for nasal dryness.  . Tachycardia, paroxysmal ([place])   . Urinary incontinence     Family History  Problem Relation Age of Onset  . Heart attack Mother   . Breast cancer Maternal Aunt     Past Surgical History:  Procedure Laterality Date  . ANTERIOR FUSION LUMBAR SPINE    . CARDIAC CATHETERIZATION  2009?    [facility]  . CARDIAC CATHETERIZATION  05/2012   [facility]: Mild hypertension 37/12 with a mean pressure of 21 mm mercury  . COLONOSCOPY  2008  . herniated disc repair    . MASS EXCISION Left 02/23/2016   Procedure: EXCISION MASS;  Surgeon: Andreas Newport  Jamal Collin, MD;  Location: [facility] ORS;  Service: General;  Laterality: Left;  . NECK SURGERY    . PORTACATH PLACEMENT Left 03/12/2016   Procedure: INSERTION PORT-A-CATH;  Surgeon: Christene Lye, MD;  Location: [facility] ORS;  Service: General;  Laterality: Left;  . tonsillectomy  1959   history  . TUBAL LIGATION     s/p    Social History   Social History  . Marital status: Married    Spouse name: N/A  . Number of children: N/A  . Years of education: N/A   Occupational History  . Not on file.   Social History Main Topics  . Smoking status: Former Smoker    Packs/day: 1.00    Years: 32.00    Types: Cigarettes    Start date: 08/10/1963    Quit date: 01/15/2006  . Smokeless tobacco: Never Used  . Alcohol use No  . Drug use: No  . Sexual activity: No   Other Topics Concern  . Not on file   Social History Narrative  . No narrative on file      Current Outpatient Prescriptions on File Prior to Visit  Medication Sig Dispense Refill  . albuterol (PROVENTIL) (2.5 MG/3ML) 0.083% nebulizer solution INHALE 3 ML EVERY 6 HOURS BY NEBULIZER AS NEEDED FOR WHEEZING AND SHORTNESS OF BREATH  4  . alclomethasone (ACLOVATE) 0.05 % cream 1(ONE) APPLICATION(S) TOPICAL EVERY DAY  1  . ALPRAZolam (XANAX XR) 1 MG 24 hr tablet Take 1 tablet (1 mg total) by mouth every morning. Fill August 22nd and monthly thereafter 30 tablet 1  . clobetasol cream (TEMOVATE) 0.05 % Apply topically 2 (two) times daily. 60 g 2  . [facility] B-12 1000 MCG TBCR TAKE 1 TABLET (1,000 MCG TOTAL) BY MOUTH DAILY.  11  . divalproex (DEPAKOTE) 250 MG DR tablet Take 1 tablet (250 mg total) by mouth at bedtime. 30 tablet 5  . gabapentin (NEURONTIN) 300 MG capsule Take 1 capsule (300 mg total) by mouth 2 (two) times daily. 180 capsule 1  . hydrochlorothiazide (HYDRODIURIL) 12.5 MG tablet Take 1 tablet (12.5 mg total) by mouth daily. (Patient taking differently: Take 12.5 mg by mouth as needed. ) 30 tablet 2  . HYDROcodone-acetaminophen (NORCO) 5-325 MG tablet Take 1 tablet by mouth 2 (two) times daily as needed for moderate pain. 60 tablet 0  . metoprolol succinate (TOPROL-XL) 50 MG 24 hr tablet Take 1 tablet (50 mg total) by mouth daily. 30 tablet 5  . omega-3 acid ethyl esters (LOVAZA) 1 g capsule Take 2 capsules (2 g total) by mouth 2 (two) times daily. 120 capsule 5  . omeprazole (PRILOSEC) 40 MG capsule Take 1 capsule (40 mg total) by mouth daily. 30 capsule 5  . OXYGEN Inhale 2 L into the lungs.    . tiotropium (SPIRIVA) 18 MCG inhalation capsule Place into inhaler and inhale.    Marland Kitchen tiZANidine (ZANAFLEX) 4 MG tablet TAKE 1 TABLET (4 MG TOTAL) BY MOUTH 3 (THREE) TIMES DAILY. 90 tablet 2  . venlafaxine XR (EFFEXOR-XR) 150 MG 24 hr capsule Take 1 capsule (150 mg total) by mouth daily with breakfast. 30 capsule 5   No current facility-administered medications on file prior to visit.      Allergies  Allergen Reactions  . Dapsone Other (See Comments)    Hypoxemia  . Sulfa Antibiotics Rash     Review of Systems ROS Review of Systems - General ROS: negative for - chills, fatigue, fever, hot flashes, night sweats,  weight gain or weight loss Psychological ROS: negative for - anxiety, decreased libido, depression, mood swings, physical abuse or sexual abuse Ophthalmic ROS: negative for - blurry vision, eye pain or loss of vision ENT ROS: negative for - headaches, hearing change, visual changes or vocal changes Allergy and Immunology ROS: negative for - hives, itchy/watery eyes or seasonal allergies Hematological and Lymphatic ROS: negative for - bleeding problems, bruising, swollen lymph nodes or weight loss Endocrine ROS: negative for - galactorrhea, hair pattern changes, hot flashes, malaise/lethargy, mood swings, palpitations, polydipsia/polyuria, skin changes, temperature intolerance or unexpected weight changes Breast ROS: negative for - new or changing breast lumps or nipple discharge Respiratory ROS: negative for - cough or shortness of breath Cardiovascular ROS: negative for - chest pain, irregular heartbeat, palpitations or shortness of breath Gastrointestinal ROS: no abdominal pain, change in bowel habits, or black or bloody stools Genito-Urinary ROS: no dysuria, trouble voiding, or hematuria.  Positive for urethral discharge Musculoskeletal ROS: negative for - joint pain or joint stiffness Neurological ROS: negative for - bowel and bladder control changes Dermatological ROS: negative for rash and skin lesion changes   Objective:   BP (!) 146/83   Pulse 73   Ht 5' 4"  (1.626 m)   Wt 183 lb 8 oz (83.2 kg)   LMP 03/04/2002 (Approximate)   BMI 31.50 kg/m  CONSTITUTIONAL: Well-developed, well-nourished female in no acute distress.  [place] O2 in place.  PSYCHIATRIC: Normal mood and affect. Normal behavior. Normal judgment and thought content. [place]: Alert and  oriented to person, place, and time. Normal muscle tone coordination. No cranial nerve deficit noted. HENT:  Normocephalic, atraumatic, External right and left ear normal. Oropharynx is clear and moist EYES: Conjunctivae and EOM are normal. Pupils are equal, round, and reactive to light. No scleral icterus.  NECK: Normal range of motion, supple, no masses.  Normal thyroid.  SKIN: Skin is warm and dry. No rash noted. Not diaphoretic. No erythema. No pallor. CARDIOVASCULAR: Normal heart rate noted, regular rhythm, no murmur. RESPIRATORY: Clear to auscultation bilaterally. Effort and breath sounds normal, no problems with respiration noted. BREASTS: Symmetric in size. No masses, skin changes, nipple drainage, or lymphadenopathy. ABDOMEN: Soft, normal bowel sounds, no distention noted.  No tenderness, rebound or guarding.  BLADDER: Normal PELVIC:  Bladder no bladder distension noted  Urethra: normal appearing urethra with no masses, tenderness or lesions and atrophic.  No discharge able to be expressed.   Vulva: normal appearing vulva with no masses, tenderness or lesions and few excoriations noted.   Vagina: atrophic and somewhat stenotic vagina due to h/o radiation.  No discharge present.   Cervix: Barely able to visualize cervix, flushed to vaginal wall. No apparent lesions. Cervix partially palpable on bimanual exam.   Uterus: difficult to palpate due to body habitus, but does not appear to be enlarged.  Adnexa: normal adnexa in size, nontender and no masses  RV: Rectal exam: external hemorrhoids noted, sphincter tone normal.  MUSCULOSKELETAL: Normal range of motion. No tenderness.  No cyanosis, clubbing, or edema.  2+ distal pulses. LYMPHATIC: No Axillary, Supraclavicular, or Inguinal Adenopathy.   Labs: Lab Results  Component Value Date   WBC 5.5 03/03/2017   HGB 12.4 03/03/2017   HCT 38.0 03/03/2017   MCV 89.6 03/03/2017   PLT 264 03/03/2017    Lab Results  Component Value Date     CREATININE 1.05 (H) 03/03/2017   BUN 12 03/03/2017   NA 143 03/03/2017   K 4.2 03/03/2017  CL 105 03/03/2017   CO2 27 03/03/2017    Lab Results  Component Value Date   ALT 29 03/03/2017   AST 24 03/03/2017   ALKPHOS 53 03/03/2017   BILITOT 0.3 03/03/2017    Lab Results  Component Value Date   CHOL 246 (H) 03/03/2017   HDL 30 (L) 03/03/2017   LDLCALC NOT CALC 03/03/2017   TRIG 606 (H) 03/03/2017   CHOLHDL 8.2 (H) 03/03/2017    No results found for: TSH  Lab Results  Component Value Date   HGBA1C 5.7 (H) 03/03/2017     Assessment:   Well woman exam with routine gynecological exam  Urethral discharge  Benign hypertension  Anal squamous cell carcinoma (HCC)  Hypertriglyceridemia  Atypical squamous cells of undetermined significance on cytologic smear of cervix (ASC-US)  Status post radiation therapy  Plan:  Pap: Pap Co Test.  Patient with h/o ASCUS HR HPV neg pap. Although discussed with patient again that her HPV is negative and she could continue routine q 3-5 year screening, patient still desires repeat pap.  Mammogram: Up to date.  Stool Guaiac Testing:  Not Ordered.  Up to date with colon cancer screening.  Labs: Recently performed by PCP.  Reviewed.  Routine preventative health maintenance measures emphasized: Exercise/Diet/Weight control and Stress Management HTN and Hyperlipidemia, managed by PCP.  [facility], up to date.  Urethral discharge. Notes that she has had negative urine tests in the past.  Is possible that patient may have a small urethral diverticulum (although nothing palpable on today's exam), or possible small vesico-vaginal fistula due to h/o radation (however no discharge noted on today's exam).  Patient notes that symptoms are now becoming more bothersome.  Would like a referral to Urology. Will refer.  Return to [place], MD

## 2017-05-09 NOTE — Progress Notes (Signed)
Pt is here for a pap smear- states last one was inconclusive.

## 2017-05-16 ENCOUNTER — Telehealth: Payer: Self-pay | Admitting: Obstetrics and Gynecology

## 2017-05-16 NOTE — Telephone Encounter (Signed)
Please call patient with Pap results

## 2017-05-19 LAB — PAPIG, HPV, RFX 16/18
HPV, HIGH-RISK: NEGATIVE
PAP Smear Comment: 0

## 2017-05-26 ENCOUNTER — Ambulatory Visit: Payer: Self-pay | Admitting: General Surgery

## 2017-05-31 ENCOUNTER — Other Ambulatory Visit: Payer: Self-pay | Admitting: Family Medicine

## 2017-05-31 ENCOUNTER — Encounter: Payer: Self-pay | Admitting: Family Medicine

## 2017-05-31 DIAGNOSIS — F411 Generalized anxiety disorder: Secondary | ICD-10-CM

## 2017-06-01 ENCOUNTER — Encounter: Payer: Self-pay | Admitting: Family Medicine

## 2017-06-02 ENCOUNTER — Encounter: Payer: Self-pay | Admitting: Family Medicine

## 2017-06-03 ENCOUNTER — Other Ambulatory Visit: Payer: Self-pay | Admitting: Family Medicine

## 2017-06-03 DIAGNOSIS — F411 Generalized anxiety disorder: Secondary | ICD-10-CM

## 2017-06-03 MED ORDER — ALPRAZOLAM ER 1 MG PO TB24: 1.0000 mg | ORAL_TABLET | ORAL | 0 refills | Status: DC

## 2017-06-06 ENCOUNTER — Encounter: Payer: Self-pay | Admitting: Family Medicine

## 2017-06-06 ENCOUNTER — Ambulatory Visit (INDEPENDENT_AMBULATORY_CARE_PROVIDER_SITE_OTHER): Payer: Medicaid Other | Admitting: Urology

## 2017-06-06 ENCOUNTER — Encounter: Payer: Self-pay | Admitting: Urology

## 2017-06-06 ENCOUNTER — Ambulatory Visit (INDEPENDENT_AMBULATORY_CARE_PROVIDER_SITE_OTHER): Payer: Medicaid Other | Admitting: Family Medicine

## 2017-06-06 VITALS — BP 134/100 | HR 83 | Resp 14 | Ht 64.0 in | Wt 183.5 lb

## 2017-06-06 VITALS — BP 142/95 | HR 83 | Ht 64.0 in | Wt 181.4 lb

## 2017-06-06 DIAGNOSIS — N3946 Mixed incontinence: Secondary | ICD-10-CM

## 2017-06-06 DIAGNOSIS — I10 Essential (primary) hypertension: Secondary | ICD-10-CM

## 2017-06-06 DIAGNOSIS — G8929 Other chronic pain: Secondary | ICD-10-CM

## 2017-06-06 DIAGNOSIS — E8881 Metabolic syndrome: Secondary | ICD-10-CM

## 2017-06-06 DIAGNOSIS — Z9981 Dependence on supplemental oxygen: Secondary | ICD-10-CM | POA: Diagnosis not present

## 2017-06-06 DIAGNOSIS — R32 Unspecified urinary incontinence: Secondary | ICD-10-CM

## 2017-06-06 DIAGNOSIS — J432 Centrilobular emphysema: Secondary | ICD-10-CM | POA: Diagnosis not present

## 2017-06-06 DIAGNOSIS — F411 Generalized anxiety disorder: Secondary | ICD-10-CM

## 2017-06-06 DIAGNOSIS — M542 Cervicalgia: Secondary | ICD-10-CM | POA: Diagnosis not present

## 2017-06-06 DIAGNOSIS — E785 Hyperlipidemia, unspecified: Secondary | ICD-10-CM

## 2017-06-06 DIAGNOSIS — I479 Paroxysmal tachycardia, unspecified: Secondary | ICD-10-CM | POA: Diagnosis not present

## 2017-06-06 DIAGNOSIS — K219 Gastro-esophageal reflux disease without esophagitis: Secondary | ICD-10-CM

## 2017-06-06 DIAGNOSIS — F339 Major depressive disorder, recurrent, unspecified: Secondary | ICD-10-CM | POA: Diagnosis not present

## 2017-06-06 LAB — URINALYSIS, COMPLETE
BILIRUBIN UA: NEGATIVE
GLUCOSE, UA: NEGATIVE
KETONES UA: NEGATIVE
Nitrite, UA: NEGATIVE
PROTEIN UA: NEGATIVE
RBC, UA: NEGATIVE
Specific Gravity, UA: <1.005 — ABNORMAL LOW (ref 1.005–1.030)
UUROB: 0.2 mg/dL (ref 0.2–1.0)
pH, UA: 6 (ref 5.0–7.5)

## 2017-06-06 MED ORDER — OMEPRAZOLE 40 MG PO CPDR: 40.0000 mg | DELAYED_RELEASE_CAPSULE | Freq: Every day | ORAL | 5 refills | Status: DC

## 2017-06-06 MED ORDER — ALPRAZOLAM ER 0.5 MG PO TB24: 0.5000 mg | ORAL_TABLET | ORAL | 2 refills | Status: DC

## 2017-06-06 MED ORDER — MIRABEGRON ER 25 MG PO TB24: 25.0000 mg | ORAL_TABLET | Freq: Every day | ORAL | 11 refills | Status: DC

## 2017-06-06 MED ORDER — HYDROCODONE-ACETAMINOPHEN 5-325 MG PO TABS: 1.0000 | ORAL_TABLET | Freq: Two times a day (BID) | ORAL | 0 refills | Status: DC

## 2017-06-06 MED ORDER — DIVALPROEX SODIUM 250 MG PO DR TAB: 250.0000 mg | DELAYED_RELEASE_TABLET | Freq: Every day | ORAL | 5 refills | Status: DC

## 2017-06-06 MED ORDER — HYDROCODONE-ACETAMINOPHEN 5-325 MG PO TABS: 1.0000 | ORAL_TABLET | Freq: Two times a day (BID) | ORAL | 0 refills | Status: DC | PRN

## 2017-06-06 MED ORDER — HYDROCHLOROTHIAZIDE 25 MG PO TABS: 12.5000 mg | ORAL_TABLET | Freq: Every day | ORAL | 0 refills | Status: DC

## 2017-06-06 MED ORDER — METOPROLOL SUCCINATE ER 50 MG PO TB24: 50.0000 mg | ORAL_TABLET | Freq: Every day | ORAL | 5 refills | Status: DC

## 2017-06-06 MED ORDER — VENLAFAXINE HCL ER 150 MG PO CP24: 150.0000 mg | ORAL_CAPSULE | Freq: Every day | ORAL | 5 refills | Status: DC

## 2017-06-06 NOTE — Progress Notes (Signed)
06/06/2017 2:10 PM   Judy Allen 09-08-1955 [id number]  Referring provider: Steele Sizer, MD [address] [place], [place] [address]  Chief Complaint  Patient presents with  . New Patient (Initial Visit)    HPI: I was consulted to assess the patient's urinary incontinence worsening over a few months.  In the last year or so she had anal cancer with chemotherapy and radiation but no surgery.  She can leak without awareness but also describes urgency and trying to undress prior to urinating.  She denies stress incontinence.  She denied bedwetting but then noted a foul-smelling discharge from the urethra she thinks.  She tends towards constipation.  She may have a vaginal discharge that is clear.  She wears 2 pads a day sometimes damp sometimes moderately wet  She has had 2 neck surgery and one lower back operation.  She has not had a hysterectomy  She does not report urinary tract infections kidney stones or previous GU surgery  Modifying factors: There are no other modifying factors  Associated signs and symptoms: There are no other associated signs and symptoms Aggravating and relieving factors: There are no other aggravating or relieving factors Severity: Moderate Duration: Persistent   PMH: Past Medical History:  Diagnosis Date  . Anemia    H/O  . Anxiety   . Arthritis   . Colon cancer ([place]) 2017   anal ca/chemo and rad  . Complication of anesthesia   . Constipation   . Dysrhythmia    TACHYCARDIA-WELL CONTROLLED ON METOPROLO  . GERD (gastroesophageal reflux disease)   . H/O allergic rhinitis   . Heart murmur   . Hemorrhoid   . Hyperlipidemia   . Hypertension   . Neck pain, chronic   . Neuritis   . Ovarian failure   . Personal history of chemotherapy   . Personal history of radiation therapy   . PONV (postoperative nausea and vomiting)   . Proteinuria   . Psoriasis   . Sleep apnea 09/12/2016   8cm H2O with 21pm oxygen mask: Eson size  Medium. Heated humidifier for nasal dryness.  . Tachycardia, paroxysmal ([place])   . Urinary incontinence     Surgical History: Past Surgical History:  Procedure Laterality Date  . ANTERIOR FUSION LUMBAR SPINE    . CARDIAC CATHETERIZATION  2009?    [facility]  . CARDIAC CATHETERIZATION  05/2012   [facility]: Mild hypertension 37/12 with a mean pressure of 21 mm mercury  . COLONOSCOPY  2008  . herniated disc repair    . MASS EXCISION Left 02/23/2016   Procedure: EXCISION MASS;  Surgeon: Christene Lye, MD;  Location: [facility] ORS;  Service: General;  Laterality: Left;  . NECK SURGERY    . PORTACATH PLACEMENT Left 03/12/2016   Procedure: INSERTION PORT-A-CATH;  Surgeon: Christene Lye, MD;  Location: [facility] ORS;  Service: General;  Laterality: Left;  . tonsillectomy  1959   history  . TUBAL LIGATION     s/p    Home Medications:  Allergies as of 06/06/2017      Reactions   Dapsone Other (See Comments)   Hypoxemia   Sulfa Antibiotics Rash      Medication List       Accurate as of 06/06/17  2:10 PM. Always use your most recent med list.          albuterol (2.5 MG/3ML) 0.083% nebulizer solution Commonly known as:  PROVENTIL INHALE 3 ML EVERY 6 HOURS BY NEBULIZER AS NEEDED  FOR WHEEZING AND SHORTNESS OF BREATH   alclomethasone 0.05 % cream Commonly known as:  ACLOVATE 1(ONE) APPLICATION(S) TOPICAL EVERY DAY   ALPRAZolam 1 MG 24 hr tablet Commonly known as:  XANAX XR Take 1 tablet (1 mg total) by mouth every morning. Fill August 22nd and monthly thereafter   clobetasol cream 0.05 % Commonly known as:  TEMOVATE Apply topically 2 (two) times daily.   [facility] B-12 1000 MCG Tbcr Generic drug:  Cyanocobalamin TAKE 1 TABLET (1,000 MCG TOTAL) BY MOUTH DAILY.   divalproex 250 MG DR tablet Commonly known as:  DEPAKOTE Take 1 tablet (250 mg total) by mouth at bedtime.   fenofibrate 48 MG tablet Commonly known as:  TRICOR TAKE 1 TABLET (48 MG TOTAL) BY MOUTH DAILY.     gabapentin 300 MG capsule Commonly known as:  NEURONTIN Take 1 capsule (300 mg total) by mouth 2 (two) times daily.   hydrochlorothiazide 12.5 MG tablet Commonly known as:  HYDRODIURIL Take 1 tablet (12.5 mg total) by mouth daily.   HYDROcodone-acetaminophen 5-325 MG tablet Commonly known as:  NORCO Take 1 tablet by mouth 2 (two) times daily as needed for moderate pain.   metoprolol succinate 50 MG 24 hr tablet Commonly known as:  TOPROL-XL Take 1 tablet (50 mg total) by mouth daily.   omega-3 acid ethyl esters 1 g capsule Commonly known as:  LOVAZA Take 2 capsules (2 g total) by mouth 2 (two) times daily.   omeprazole 40 MG capsule Commonly known as:  PRILOSEC Take 1 capsule (40 mg total) by mouth daily.   OXYGEN Inhale 2 L into the lungs.   tiotropium 18 MCG inhalation capsule Commonly known as:  [place] into inhaler and inhale.   tiZANidine 4 MG tablet Commonly known as:  ZANAFLEX TAKE 1 TABLET (4 MG TOTAL) BY MOUTH 3 (THREE) TIMES DAILY.   venlafaxine XR 150 MG 24 hr capsule Commonly known as:  EFFEXOR-XR Take 1 capsule (150 mg total) by mouth daily with breakfast.       Allergies:  Allergies  Allergen Reactions  . Dapsone Other (See Comments)    Hypoxemia  . Sulfa Antibiotics Rash    Family History: Family History  Problem Relation Age of Onset  . Heart attack Mother   . Breast cancer Maternal Aunt   . Bladder Cancer Neg Hx   . Kidney cancer Neg Hx     Social History:  reports that she quit smoking about 11 years ago. Her smoking use included Cigarettes. She started smoking about 53 years ago. She has a 32.00 pack-year smoking history. She has never used smokeless tobacco. She reports that she does not drink alcohol or use drugs.  ROS: UROLOGY Frequent Urination?: Yes Hard to postpone urination?: No Burning/pain with urination?: Yes Get up at night to urinate?: Yes Leakage of urine?: Yes Urine stream starts and stops?: Yes Trouble  starting stream?: No Do you have to strain to urinate?: No Blood in urine?: No Urinary tract infection?: No Sexually transmitted disease?: No Injury to kidneys or bladder?: No Painful intercourse?: No Weak stream?: No Currently pregnant?: No Vaginal bleeding?: No Last menstrual period?: n  Gastrointestinal Nausea?: No Vomiting?: No Indigestion/heartburn?: No Diarrhea?: No Constipation?: No  Constitutional Fever: No Night sweats?: No Weight loss?: No Fatigue?: Yes  Skin Skin rash/lesions?: Yes Itching?: No  Eyes Blurred vision?: No Double vision?: No  Ears/Nose/Throat Sore throat?: No Sinus problems?: No  Hematologic/Lymphatic Swollen glands?: No Easy bruising?: No  Cardiovascular Leg swelling?:  No Chest pain?: No  Respiratory Cough?: No Shortness of breath?: Yes  Endocrine Excessive thirst?: No  Musculoskeletal Back pain?: No Joint pain?: No  Neurological Headaches?: No Dizziness?: No  Psychologic Depression?: Yes Anxiety?: Yes  Physical Exam: BP (!) 142/95 (BP Location: Right Arm, Patient Position: Sitting, Cuff Size: Normal)   Pulse 83   Ht 5\' 4"  (1.626 m)   Wt 181 lb 6.4 oz (82.3 kg)   LMP 03/04/2002 (Approximate)   BMI 31.14 kg/m   Constitutional:  Alert and oriented, No acute distress. HEENT: [place] AT, moist mucus membranes.  Trachea midline, no masses. Cardiovascular: No clubbing, cyanosis, or edema. Respiratory: Normal respiratory effort, no increased work of breathing. GI: Abdomen is soft, nontender, nondistended, no abdominal masses GU: On pelvic examination the patient had moderately severe vaginal atrophy.  I saw no vaginitis discharge blood or urine in the vaginal vault.  She had a grade 1 or small grade 2 cystocele.  She had elevated tissue underneath the urethra that was nontender and she probably does not have a urethral diverticulum but she could.  She had a small distal rectocele.  The anus looked within normal limits Skin:  No rashes, bruises or suspicious lesions. Lymph: No cervical or inguinal adenopathy. Neurologic: Grossly intact, no focal deficits, moving all 4 extremities. Psychiatric: Normal mood and affect.  Laboratory Data: Lab Results  Component Value Date   WBC 5.5 03/03/2017   HGB 12.4 03/03/2017   HCT 38.0 03/03/2017   MCV 89.6 03/03/2017   PLT 264 03/03/2017    Lab Results  Component Value Date   CREATININE 1.05 (H) 03/03/2017    No results found for: PSA  No results found for: TESTOSTERONE  Lab Results  Component Value Date   HGBA1C 5.7 (H) 03/03/2017    Urinalysis    Component Value Date/Time   COLORURINE STRAW (A) 11/02/2015 1227   APPEARANCEUR CLEAR (A) 11/02/2015 1227   APPEARANCEUR Clear 06/08/2012 1117   LABSPEC 1.008 11/02/2015 1227   LABSPEC 1.005 06/08/2012 1117   PHURINE 7.0 11/02/2015 1227   GLUCOSEU NEGATIVE 11/02/2015 1227   GLUCOSEU Negative 06/08/2012 1117   HGBUR NEGATIVE 11/02/2015 1227   BILIRUBINUR NEGATIVE 11/02/2015 1227   BILIRUBINUR Negative 06/08/2012 1117   KETONESUR NEGATIVE 11/02/2015 1227   PROTEINUR NEGATIVE 11/02/2015 1227   NITRITE NEGATIVE 11/02/2015 1227   LEUKOCYTESUR TRACE (A) 11/02/2015 1227   LEUKOCYTESUR Negative 06/08/2012 1117    Pertinent Imaging: none  Assessment & Plan: The patient has urinary incontinence not associated with awareness.  She denies stress incontinence but I believe she also has urgency incontinence.  She has had pelvic radiation.  Her degree of leakage and pelvic exam is not in keeping with the fistula.  The patient did not have blood in the urine but I sent the urine for culture.  I like to see the patient on Myrbetriq 25 mg in about 1 month for cystoscopy.  We will proceed accordingly.  I may or may not order urodynamics immediately.  I did mention the test today  1. Urinary incontinence, unspecified type  - Urinalysis, Complete   No Follow-up on file.  Reece Packer, MD  [facility] [address] [place], [place] [address] [phone]

## 2017-06-06 NOTE — Progress Notes (Signed)
Name: Judy Allen   MRN: [id number]    DOB: 03-15-1956   Date:06/07/2017       Progress Note  Subjective  Chief Complaint  Chief Complaint  Patient presents with  . Medication Refill    HPI  HTN:bp is going up again, over the past week, she is now back on HCTZ 12.5 mg and Toprol XL , not having headaches, but has noticed palpitation and some dizziness lately. No chest pain. We will change dose of HCTZ. Denies any extra stress lately. She was out of alprazolam for a few days last week and not sure if that is the cause.   RUQ pain: intermittent RUQ pain, not sure of triggering factors, but PET scan done in April showed gallstones, but further testing not approved by insurance. No nausea or vomiting, last triglycerides was very hight, but she is not sure if taking Tricor or Lovaza at this time.   Neck pain: neck pain is still present still having constant  radiculitis left arm again, she was sent back to [facility] 2017. She had a steroid injection injection for her facet arthropathy but there was no improvement of the pain. She was advised to try otc nsaid's and continue hydrocodone. Pain is described as sharp and throbbing pain, sometimes burning, currently a 4/10  Chronic low back pain: she was seen by [facility], history of lumbar spine surgery and had injections by Dr. Arneta Cliche in the past with improvement of symptoms, no radiculitis, worse when bending forward. Taking hydrocodone and also muscle relaxer and last drug screen was done 05/2016 , [place] database checked 03/02/2017  and I am the only rx of controlled medications  GAD/Depression Major: currently on Effexor and Depakote because Abilify and Cymbalta was too expenisve- no longer could afford. She was doing better after anal cancer therapy, back to baseline.  She denies suicidal thoughts or ideation. She has anhedonia, helping her daughter again, no longer has daily crying spells. She was  referred to psychiatrist but cancelled appointment because she was feeling better.   Anal Squamous cell Carcinoma: seeing oncologist and radiation oncologist at [facility]. PET scan was reviewed with patient, she has follow up at [facility] every 3 months for 2 years  Emphysema: she used to smoke for many years, but quit 10 years ago, but was admitted to [facility] in October, had CT chest to rule out PE and was found to have centrilobular emphysema and also hypoxemia, she was sent home on 2-3 liters of oxygen. She goes to [facility] now, no longer seeing Dr. Mortimer Fries  OSA ;diagnosed at [facility], on 8 cm H2 O pressure, but not compliant with CPAP but she has not been compliant, she states it was bothering her sleep and pulse ox was still low during the night, she states they are trying to increase pressure   Dysmetabolic syndrome: avoiding sweets, losing a little weight,  no polyphagia, polydipsia but has polyuria     Patient Active Problem List   Diagnosis Date Noted  . Gallstones 12/22/2016  . Fatty liver 12/22/2016  . Chronic respiratory failure with hypoxia, on home oxygen therapy ([place]) 07/28/2016  . Centrilobular emphysema ([place]) 07/14/2016  . Abnormal Pap smear of cervix 04/14/2016  . Anal squamous cell carcinoma ([place]) 03/09/2016  . Metastatic squamous cell carcinoma ([place]) 02/25/2016  . Non-thrombocytopenic purpura ([place]) 01/12/2016  . Chronic constipation 10/16/2015  . History of fusion of cervical spine 04/02/2015  . Benign hypertension 01/16/2015  . Chronic cervical  pain 01/16/2015  . CN (constipation) 01/16/2015  . Gastric reflux 01/16/2015  . Hypertriglyceridemia 01/16/2015  . Edema leg 01/16/2015  . Chronic recurrent major depressive disorder ([place]) 01/16/2015  . Dysmetabolic syndrome 35/36/1443  . Obstructive apnea 01/16/2015  . Psoriasis 01/16/2015  . Allergic rhinitis 01/16/2015  . Bursitis, trochanteric 01/16/2015  . GAD (generalized anxiety disorder) 01/16/2015  . Tachycardia, paroxysmal [facility])      Past Surgical History:  Procedure Laterality Date  . ANTERIOR FUSION LUMBAR SPINE    . CARDIAC CATHETERIZATION  2009?    [facility];[place]  . CARDIAC CATHETERIZATION  05/2012   [facility]: Mild hypertension 37/12 with a mean pressure of 21 mm mercury  . COLONOSCOPY  2008  . herniated disc repair    . MASS EXCISION Left 02/23/2016   Procedure: EXCISION MASS;  Surgeon: Christene Lye, MD;  Location: [facility] ORS;  Service: General;  Laterality: Left;  . NECK SURGERY    . PORTACATH PLACEMENT Left 03/12/2016   Procedure: INSERTION PORT-A-CATH;  Surgeon: Christene Lye, MD;  Location: [facility] ORS;  Service: General;  Laterality: Left;  . tonsillectomy  1959   history  . TUBAL LIGATION     s/p    Family History  Problem Relation Age of Onset  . Heart attack Mother   . Breast cancer Maternal Aunt   . Bladder Cancer Neg Hx   . Kidney cancer Neg Hx     Social History   Social History  . Marital status: Married    Spouse name: N/A  . Number of children: N/A  . Years of education: N/A   Occupational History  . Not on file.   Social History Main Topics  . Smoking status: Former Smoker    Packs/day: 1.00    Years: 32.00    Types: Cigarettes    Start date: 08/10/1963    Quit date: 01/15/2006  . Smokeless tobacco: Never Used  . Alcohol use No  . Drug use: No  . Sexual activity: No   Other Topics Concern  . Not on file   Social History Narrative  . No narrative on file     Current Outpatient Prescriptions:  .  albuterol (PROVENTIL) (2.5 MG/3ML) 0.083% nebulizer solution, INHALE 3 ML EVERY 6 HOURS BY NEBULIZER AS NEEDED FOR WHEEZING AND SHORTNESS OF BREATH, Disp: , Rfl: 4 .  alclomethasone (ACLOVATE) 0.05 % cream, 1(ONE) APPLICATION(S) TOPICAL EVERY DAY, Disp: , Rfl: 1 .  ALPRAZolam (XANAX XR) 0.5 MG 24 hr tablet, Take 1 tablet (0.5 mg total) by mouth every morning. Fill August 22nd and monthly thereafter, Disp: 30 tablet, Rfl: 2 .  clobetasol cream (TEMOVATE) 0.05 %, Apply  topically 2 (two) times daily., Disp: 60 g, Rfl: 2 .  [employer] B-12 1000 MCG TBCR, TAKE 1 TABLET (1,000 MCG TOTAL) BY MOUTH DAILY., Disp: , Rfl: 11 .  divalproex (DEPAKOTE) 250 MG DR tablet, Take 1 tablet (250 mg total) by mouth at bedtime., Disp: 30 tablet, Rfl: 5 .  fenofibrate (TRICOR) 48 MG tablet, TAKE 1 TABLET (48 MG TOTAL) BY MOUTH DAILY., Disp: , Rfl: 5 .  gabapentin (NEURONTIN) 300 MG capsule, Take 1 capsule (300 mg total) by mouth 2 (two) times daily., Disp: 180 capsule, Rfl: 1 .  hydrochlorothiazide (HYDRODIURIL) 25 MG tablet, Take 0.5 tablets (12.5 mg total) by mouth daily., Disp: 30 tablet, Rfl: 0 .  HYDROcodone-acetaminophen (NORCO) 5-325 MG tablet, Take 1 tablet by mouth 2 (two) times daily as needed for moderate pain., Disp: 60 tablet,  Rfl: 0 .  metoprolol succinate (TOPROL-XL) 50 MG 24 hr tablet, Take 1 tablet (50 mg total) by mouth daily., Disp: 30 tablet, Rfl: 5 .  mirabegron ER (MYRBETRIQ) 25 MG TB24 tablet, Take 1 tablet (25 mg total) by mouth daily., Disp: 30 tablet, Rfl: 11 .  omega-3 acid ethyl esters (LOVAZA) 1 g capsule, Take 2 capsules (2 g total) by mouth 2 (two) times daily., Disp: 120 capsule, Rfl: 5 .  omeprazole (PRILOSEC) 40 MG capsule, Take 1 capsule (40 mg total) by mouth daily., Disp: 30 capsule, Rfl: 5 .  OXYGEN, Inhale 2 L into the lungs., Disp: , Rfl:  .  tiotropium (SPIRIVA) 18 MCG inhalation capsule, Place into inhaler and inhale., Disp: , Rfl:  .  tiZANidine (ZANAFLEX) 4 MG tablet, TAKE 1 TABLET (4 MG TOTAL) BY MOUTH 3 (THREE) TIMES DAILY., Disp: 90 tablet, Rfl: 2 .  venlafaxine XR (EFFEXOR-XR) 150 MG 24 hr capsule, Take 1 capsule (150 mg total) by mouth daily with breakfast., Disp: 30 capsule, Rfl: 5 .  HYDROcodone-acetaminophen (NORCO/VICODIN) 5-325 MG tablet, Take 1 tablet by mouth 2 (two) times daily., Disp: 60 tablet, Rfl: 0 .  HYDROcodone-acetaminophen (NORCO/VICODIN) 5-325 MG tablet, Take 1 tablet by mouth 2 (two) times daily., Disp: 60 tablet, Rfl:  0  Allergies  Allergen Reactions  . Dapsone Other (See Comments)    Hypoxemia  . Sulfa Antibiotics Rash     ROS  Constitutional: Negative for fever or weight change.  Respiratory: Negative for cough but has  shortness of breath.   Cardiovascular: Negative for chest pain , positive for intermittent  palpitations.  Gastrointestinal: Negative for abdominal pain, no bowel changes.  Musculoskeletal: Negative for gait problem or joint swelling.  Skin: Negative for rash.  Neurological: Negative for dizziness or headache.  No other specific complaints in a complete review of systems (except as listed in HPI above).  Objective  Vitals:   06/06/17 1457 06/06/17 1458  BP: (!) 150/100 (!) 134/100  Pulse: 83   Resp: 14   SpO2: 92%   Weight: 183 lb 8 oz (83.2 kg)   Height: 5\' 4"  (1.626 m)     Body mass index is 31.5 kg/m.  Physical Exam  Constitutional: Patient appears well-developed and well-nourished. Obese  No distress.  HEENT: head atraumatic, normocephalic, pupils equal and reactive to light,  throat within normal limits. Pain with rom of her neck, decrease rom Cardiovascular: Normal rate, regular rhythm and normal heart sounds.  No murmur heard. No BLE edema. Pulmonary/Chest: Effort normal and breath sounds normal. No respiratory distress. Abdominal: Soft.  There is no tenderness. Psychiatric: Patient has a normal mood and affect. behavior is normal. Judgment and thought content normal.  Recent Results (from the past 2160 hour(s))  PapIG, HPV, rfx 16/18     Status: Abnormal   Collection Time: 05/09/17  4:47 PM  Result Value Ref Range   DIAGNOSIS: Comment (A)     Comment: EPITHELIAL CELL ABNORMALITY. ATYPICAL SQUAMOUS CELLS OF UNDETERMINED SIGNIFICANCE.    Specimen adequacy: Comment     Comment: Satisfactory for evaluation. No endocervical component is identified.   Clinician Provided ICD10 Comment     Comment: D40.814 R87.610 R87.615    Performed by: Comment      Comment: Ranell Patrick, Cytotechnologist (ASCP)   Electronically signed by: Comment     Comment: Rowland Lathe, MD, Pathologist   PAP Smear Comment .    PATHOLOGIST PROVIDED ICD10: Comment     Comment: R87.610  Note: Comment     Comment: The Pap smear is a screening test designed to aid in the detection of premalignant and malignant conditions of the uterine cervix.  It is not a diagnostic procedure and should not be used as the sole means of detecting cervical cancer.  Both false-positive and false-negative reports do occur.    Test Methodology Comment     Comment: This liquid based ThinPrep(R) pap test was screened with the use of an image guided system.    HPV, high-risk Negative Negative    Comment: This high-risk HPV test detects thirteen high-risk types (16/18/31/33/35/39/45/51/52/56/58/59/68) without differentiation.   Urinalysis, Complete     Status: Abnormal   Collection Time: 06/06/17  1:52 PM  Result Value Ref Range   Specific Gravity, UA <1.005 (L) 1.005 - 1.030   pH, UA 6.0 5.0 - 7.5   Color, UA Yellow Yellow   Appearance Ur Clear Clear   Leukocytes, UA Trace (A) Negative   Protein, UA Negative Negative/Trace   Glucose, UA Negative Negative   Ketones, UA Negative Negative   RBC, UA Negative Negative   Bilirubin, UA Negative Negative   Urobilinogen, Ur 0.2 0.2 - 1.0 mg/dL   Nitrite, UA Negative Negative  TSH     Status: None   Collection Time: 06/06/17  3:39 PM  Result Value Ref Range   TSH 1.33 0.40 - 4.50 mIU/L  CBC     Status: None   Collection Time: 06/06/17  3:39 PM  Result Value Ref Range   WBC 5.8 3.8 - 10.8 Thousand/uL   RBC 4.24 3.80 - 5.10 Million/uL   Hemoglobin 12.7 11.7 - 15.5 g/dL   HCT 37.0 35.0 - 45.0 %   MCV 87.3 80.0 - 100.0 fL   MCH 30.0 27.0 - 33.0 pg   MCHC 34.3 32.0 - 36.0 g/dL   RDW 13.9 11.0 - 15.0 %   Platelets 280 140 - 400 Thousand/uL   MPV 9.4 7.5 - 12.5 fL  Lipid panel     Status: Abnormal   Collection Time: 06/06/17   3:39 PM  Result Value Ref Range   Cholesterol 237 (H) <200 mg/dL   HDL 39 (L) >50 mg/dL   Triglycerides 606 (H) <150 mg/dL   LDL Cholesterol (Calc)  mg/dL (calc)    Comment: . LDL cholesterol not calculated. Triglyceride levels greater than 400 mg/dL invalidate calculated LDL results. . Reference range: <100 . Desirable range <100 mg/dL for primary prevention;   <70 mg/dL for patients with CHD or diabetic patients  with > or = 2 CHD risk factors. Marland Kitchen LDL-C is now calculated using the Martin-Hopkins  calculation, which is a validated novel method providing  better accuracy than the Friedewald equation in the  estimation of LDL-C.  [name]. 5427;062(37): 2061-2068  (http://education.[employer].com/faq/FAQ164)    Total CHOL/HDL Ratio 6.1 (H) <5.0 (calc)   Non-HDL Cholesterol (Calc) 198 (H) <130 mg/dL (calc)    Comment: For patients with diabetes plus 1 major ASCVD risk  factor, treating to a non-HDL-C goal of <100 mg/dL  (LDL-C of <70 mg/dL) is considered a therapeutic  option.   COMPLETE METABOLIC PANEL WITH GFR     Status: Abnormal   Collection Time: 06/06/17  3:39 PM  Result Value Ref Range   Glucose, Bld 89 65 - 99 mg/dL    Comment: .            Fasting reference interval .    BUN 9 7 -  25 mg/dL   Creat 1.05 (H) 0.50 - 0.99 mg/dL    Comment: For patients >27 years of age, the reference limit for Creatinine is approximately 13% higher for people identified as African-American. .    GFR, Est Non African American 57 (L) > OR = 60 mL/min/1.24m2   GFR, Est African American 66 > OR = 60 mL/min/1.90m2   BUN/Creatinine Ratio 9 6 - 22 (calc)   Sodium 141 135 - 146 mmol/L   Potassium 4.1 3.5 - 5.3 mmol/L   Chloride 102 98 - 110 mmol/L   CO2 31 20 - 32 mmol/L   Calcium 10.0 8.6 - 10.4 mg/dL   Total Protein 7.4 6.1 - 8.1 g/dL   Albumin 4.9 3.6 - 5.1 g/dL   Globulin 2.5 1.9 - 3.7 g/dL (calc)   AG Ratio 2.0 1.0 - 2.5 (calc)   Total Bilirubin 0.4 0.2 - 1.2  mg/dL   Alkaline phosphatase (APISO) 63 33 - 130 U/L   AST 33 10 - 35 U/L   ALT 47 (H) 6 - 29 U/L     PHQ2/9: Depression screen [facility] 2/9 12/22/2016 10/13/2016 09/24/2016 07/14/2016 06/01/2016  Decreased Interest 0 1 0 3 1  Down, Depressed, Hopeless 0 3 1 3 3   PHQ - 2 Score 0 4 1 6 4   Altered sleeping - 1 - 3 0  Tired, decreased energy - 3 - 3 3  Change in appetite - 1 - 0 3  Feeling bad or failure about yourself  - 1 - 3 0  Trouble concentrating - 0 - 0 0  Moving slowly or fidgety/restless - 0 - 3 0  Suicidal thoughts - 0 - 0 0  PHQ-9 Score - 10 - 18 10  Difficult doing work/chores - Somewhat difficult - Not difficult at all Somewhat difficult     Fall Risk: Fall Risk  06/06/2017 12/22/2016 10/13/2016 09/24/2016 07/14/2016  Falls in the past year? No No Yes Yes Yes  Number falls in past yr: - - 1 1 1   Injury with Fall? - - No No No  Follow up - - - - -    Functional Status Survey: Is the patient deaf or have difficulty hearing?: No Does the patient have difficulty seeing, even when wearing glasses/contacts?: No Does the patient have difficulty concentrating, remembering, or making decisions?: No Does the patient have difficulty walking or climbing stairs?: No Does the patient have difficulty dressing or bathing?: No Does the patient have difficulty doing errands alone such as visiting a doctor's office or shopping?: No   Assessment & Plan  1. Chronic recurrent major depressive disorder (HCC)  - divalproex (DEPAKOTE) 250 MG DR tablet; Take 1 tablet (250 mg total) by mouth at bedtime.  Dispense: 30 tablet; Refill: 5 - venlafaxine XR (EFFEXOR-XR) 150 MG 24 hr capsule; Take 1 capsule (150 mg total) by mouth daily with breakfast.  Dispense: 30 capsule; Refill: 5 She did not keep appointment with psychiatrist because she felt better, explained based on new guidelines she should not be on narcotics and BZDs, taking too many sedating medications, especially because of COPD. She is willing  to go down on Alprazolam dose from 1 mg to 0.5 mg daily   2. GAD (generalized anxiety disorder)  - ALPRAZolam (XANAX XR) 0.5 MG 24 hr tablet; Take 1 tablet (0.5 mg total) by mouth every morning. Fill August 22nd and monthly thereafter  Dispense: 30 tablet; Refill: 2 - venlafaxine XR (EFFEXOR-XR) 150 MG 24 hr capsule; Take  1 capsule (150 mg total) by mouth daily with breakfast.  Dispense: 30 capsule; Refill: 5  3. Uncontrolled hypertension  bp has been up over the past week, she went without alprazolam for 3 days, but has been taking it again for the past 3 days, and bp is still elevated today. We will adjust dose of hctz, she said she was taking it prn in the past, continue toprol XL and return in a couple of weeks for follow up Discussed checking labs - hydrochlorothiazide (HYDRODIURIL) 25 MG tablet; Take 0.5 tablets (12.5 mg total) by mouth daily.  Dispense: 30 tablet; Refill: 0 - comp panel - CBC - TSH  4. Dysmetabolic syndrome  Discussed life style modification  5. Chronic cervical pain  She still has a balance at the neurosurgeon clinic and was advised to pay off and follow up with them - HYDROcodone-acetaminophen (NORCO) 5-325 MG tablet; Take 1 tablet by mouth 2 (two) times daily as needed for moderate pain.  Dispense: 60 tablet; Refill: 0 - HYDROcodone-acetaminophen (NORCO/VICODIN) 5-325 MG tablet; Take 1 tablet by mouth 2 (two) times daily.  Dispense: 60 tablet; Refill: 0 - HYDROcodone-acetaminophen (NORCO/VICODIN) 5-325 MG tablet; Take 1 tablet by mouth 2 (two) times daily.  Dispense: 60 tablet; Refill: 0  6. Centrilobular emphysema ([place])  Seeing pulmonologist at [facility], she is only on Spiriva and albuterol prn   7. Oxygen dependent  On 2-3 liters of oxygen  8. Tachycardia, paroxysmal (HCC)  - metoprolol succinate (TOPROL-XL) 50 MG 24 hr tablet; Take 1 tablet (50 mg total) by mouth daily.  Dispense: 30 tablet; Refill: 5  9. Benign hypertension  - metoprolol succinate  (TOPROL-XL) 50 MG 24 hr tablet; Take 1 tablet (50 mg total) by mouth daily.  Dispense: 30 tablet; Refill: 5  10. Gastroesophageal reflux disease without esophagitis  - omeprazole (PRILOSEC) 40 MG capsule; Take 1 capsule (40 mg total) by mouth daily.  Dispense: 30 capsule; Refill: 5  11. Dyslipidemia  She is not sure if she is taking Tricor or Lovaza, she was advised to bring all medications to her visits. States it was changed in [place] but not sure why   - Lipid panel

## 2017-06-07 LAB — COMPLETE METABOLIC PANEL WITH GFR
AG Ratio: 2 (calc) (ref 1.0–2.5)
ALT: 47 U/L — AB (ref 6–29)
AST: 33 U/L (ref 10–35)
Albumin: 4.9 g/dL (ref 3.6–5.1)
Alkaline phosphatase (APISO): 63 U/L (ref 33–130)
BILIRUBIN TOTAL: 0.4 mg/dL (ref 0.2–1.2)
BUN/Creatinine Ratio: 9 (calc) (ref 6–22)
BUN: 9 mg/dL (ref 7–25)
CALCIUM: 10 mg/dL (ref 8.6–10.4)
CHLORIDE: 102 mmol/L (ref 98–110)
CO2: 31 mmol/L (ref 20–32)
Creat: 1.05 mg/dL — ABNORMAL HIGH (ref 0.50–0.99)
GFR, EST AFRICAN AMERICAN: 66 mL/min/{1.73_m2} (ref >=60)
GFR, Est Non African American: 57 mL/min/{1.73_m2} — ABNORMAL LOW (ref >=60)
GLUCOSE: 89 mg/dL (ref 65–99)
Globulin: 2.5 g/dL (calc) (ref 1.9–3.7)
Potassium: 4.1 mmol/L (ref 3.5–5.3)
Sodium: 141 mmol/L (ref 135–146)
TOTAL PROTEIN: 7.4 g/dL (ref 6.1–8.1)

## 2017-06-07 LAB — LIPID PANEL
Cholesterol: 237 mg/dL — ABNORMAL HIGH (ref <200)
HDL: 39 mg/dL — ABNORMAL LOW (ref >50)
NON-HDL CHOLESTEROL (CALC): 198 mg/dL — AB (ref <130)
TRIGLYCERIDES: 606 mg/dL — AB (ref <150)
Total CHOL/HDL Ratio: 6.1 (calc) — ABNORMAL HIGH (ref <5.0)

## 2017-06-07 LAB — CBC
HEMATOCRIT: 37 % (ref 35.0–45.0)
Hemoglobin: 12.7 g/dL (ref 11.7–15.5)
MCH: 30 pg (ref 27.0–33.0)
MCHC: 34.3 g/dL (ref 32.0–36.0)
MCV: 87.3 fL (ref 80.0–100.0)
MPV: 9.4 fL (ref 7.5–12.5)
Platelets: 280 10*3/uL (ref 140–400)
RBC: 4.24 10*6/uL (ref 3.80–5.10)
RDW: 13.9 % (ref 11.0–15.0)
WBC: 5.8 10*3/uL (ref 3.8–10.8)

## 2017-06-07 LAB — TSH: TSH: 1.33 mIU/L (ref 0.40–4.50)

## 2017-06-13 ENCOUNTER — Ambulatory Visit: Payer: Medicaid Other | Admitting: Family Medicine

## 2017-06-21 ENCOUNTER — Encounter: Payer: Self-pay | Admitting: Family Medicine

## 2017-06-21 ENCOUNTER — Ambulatory Visit: Payer: Medicaid Other | Admitting: Family Medicine

## 2017-06-21 VITALS — BP 120/80 | HR 81 | Resp 14 | Ht 64.0 in | Wt 183.7 lb

## 2017-06-21 DIAGNOSIS — F411 Generalized anxiety disorder: Secondary | ICD-10-CM

## 2017-06-21 DIAGNOSIS — I10 Essential (primary) hypertension: Secondary | ICD-10-CM

## 2017-06-21 DIAGNOSIS — E781 Pure hyperglyceridemia: Secondary | ICD-10-CM

## 2017-06-21 MED ORDER — HYDROCHLOROTHIAZIDE 25 MG PO TABS: 12.5000 mg | ORAL_TABLET | Freq: Every day | ORAL | 1 refills | Status: DC

## 2017-06-21 MED ORDER — OMEGA-3-ACID ETHYL ESTERS 1 G PO CAPS: 2.0000 g | ORAL_CAPSULE | Freq: Two times a day (BID) | ORAL | 5 refills | Status: DC

## 2017-06-21 NOTE — Progress Notes (Signed)
Name: Judy Allen   MRN: [id number]    DOB: [birth year]   Date:06/21/2017       Progress Note  Subjective  Chief Complaint  Chief Complaint  Patient presents with  . Anxiety  . Depression  . Medication Refill    Refill HCTZ    HPI  HTN: she was seen 2 weeks ago and bp was really high, labs done and reviewed with patient today, BP at goal over the past few days, HCTZ seems to be working and patient denies side effects. She states the dizziness and headaches have resolved since bp is back to normal. No chest pain but has occasional palpitatoin  Chronic pain: explained that she must see pain clinic, pain not controlled even on hydrocodone, she is trying to wean self off BZD so she can continue pain medications.   GAD/Depression: Phq9 improved since last visit, continue Effexor, and trying to wean self off xanax.   High triglycerides and low HDL: she states not taking Lovaza still on Tricor, we will re-send rx to pharmacy and stop tricor once filled.   Patient Active Problem List   Diagnosis Date Noted  . Gallstones 12/22/2016  . Fatty liver 12/22/2016  . Chronic respiratory failure with hypoxia, on home oxygen therapy ([place]) 07/28/2016  . Centrilobular emphysema ([place]) 07/14/2016  . Abnormal Pap smear of cervix 04/14/2016  . Anal squamous cell carcinoma ([place]) 03/09/2016  . Metastatic squamous cell carcinoma ([place]) 02/25/2016  . Non-thrombocytopenic purpura ([place]) 01/12/2016  . Chronic constipation 10/16/2015  . History of fusion of cervical spine 04/02/2015  . Benign hypertension 01/16/2015  . Chronic cervical pain 01/16/2015  . CN (constipation) 01/16/2015  . Gastric reflux 01/16/2015  . Hypertriglyceridemia 01/16/2015  . Edema leg 01/16/2015  . Chronic recurrent major depressive disorder ([place]) 01/16/2015  . Dysmetabolic syndrome 76/16/0737  . Obstructive apnea 01/16/2015  . Psoriasis 01/16/2015  . Allergic rhinitis 01/16/2015  . Bursitis, trochanteric 01/16/2015  . GAD  (generalized anxiety disorder) 01/16/2015  . Tachycardia, paroxysmal [facility])     Past Surgical History:  Procedure Laterality Date  . ANTERIOR FUSION LUMBAR SPINE    . CARDIAC CATHETERIZATION  2009?    [facility]  . CARDIAC CATHETERIZATION  05/2012   [facility]HC: Mild hypertension 37/12 with a mean pressure of 21 mm mercury  . COLONOSCOPY  2008  . herniated disc repair    . NECK SURGERY    . tonsillectomy  1959   history  . TUBAL LIGATION     s/p    Family History  Problem Relation Age of Onset  . Heart attack Mother   . Breast cancer Maternal Aunt   . Bladder Cancer Neg Hx   . Kidney cancer Neg Hx     Social History   Socioeconomic History  . Marital status: Married    Spouse name: Not on file  . Number of children: Not on file  . Years of education: Not on file  . Highest education level: Not on file  Social Needs  . Financial resource strain: Not on file  . Food insecurity - worry: Not on file  . Food insecurity - inability: Not on file  . Transportation needs - medical: Not on file  . Transportation needs - non-medical: Not on file  Occupational History  . Not on file  Tobacco Use  . Smoking status: Former Smoker    Packs/day: 1.00    Years: 32.00    Pack years: 32.00    Types: Cigarettes  Start date: 08/10/1963    Last attempt to quit: 01/15/2006    Years since quitting: 11.4  . Smokeless tobacco: Never Used  Substance and Sexual Activity  . Alcohol use: No    Alcohol/week: 0.0 oz  . Drug use: No  . Sexual activity: No    Birth control/protection: Post-menopausal  Other Topics Concern  . Not on file  Social History Narrative  . Not on file     Current Outpatient Medications:  .  albuterol (PROVENTIL HFA;VENTOLIN HFA) 108 (90 Base) MCG/ACT inhaler, Inhale 2 puffs 4 (four) times daily into the lungs., Disp: , Rfl:  .  alclomethasone (ACLOVATE) 0.05 % cream, 1(ONE) APPLICATION(S) TOPICAL EVERY DAY, Disp: , Rfl: 1 .  ALPRAZolam (XANAX XR) 0.5 MG 24 hr tablet,  Take 1 tablet (0.5 mg total) by mouth every morning. Fill August 22nd and monthly thereafter, Disp: 30 tablet, Rfl: 2 .  budesonide-formoterol (SYMBICORT) 160-4.5 MCG/ACT inhaler, Inhale 2 puffs 2 (two) times daily into the lungs., Disp: , Rfl:  .  clobetasol cream (TEMOVATE) 0.05 %, Apply topically 2 (two) times daily., Disp: 60 g, Rfl: 2 .  [employer] B-12 1000 MCG TBCR, TAKE 1 TABLET (1,000 MCG TOTAL) BY MOUTH DAILY., Disp: , Rfl: 11 .  divalproex (DEPAKOTE) 250 MG DR tablet, Take 1 tablet (250 mg total) by mouth at bedtime., Disp: 30 tablet, Rfl: 5 .  gabapentin (NEURONTIN) 300 MG capsule, Take 1 capsule (300 mg total) by mouth 2 (two) times daily., Disp: 180 capsule, Rfl: 1 .  hydrochlorothiazide (HYDRODIURIL) 25 MG tablet, Take 0.5 tablets (12.5 mg total) by mouth daily., Disp: 30 tablet, Rfl: 0 .  HYDROcodone-acetaminophen (NORCO) 5-325 MG tablet, Take 1 tablet by mouth 2 (two) times daily as needed for moderate pain., Disp: 60 tablet, Rfl: 0 .  HYDROcodone-acetaminophen (NORCO/VICODIN) 5-325 MG tablet, Take 1 tablet by mouth 2 (two) times daily., Disp: 60 tablet, Rfl: 0 .  HYDROcodone-acetaminophen (NORCO/VICODIN) 5-325 MG tablet, Take 1 tablet by mouth 2 (two) times daily., Disp: 60 tablet, Rfl: 0 .  metoprolol succinate (TOPROL-XL) 50 MG 24 hr tablet, Take 1 tablet (50 mg total) by mouth daily., Disp: 30 tablet, Rfl: 5 .  mirabegron ER (MYRBETRIQ) 25 MG TB24 tablet, Take 1 tablet (25 mg total) by mouth daily., Disp: 30 tablet, Rfl: 11 .  omeprazole (PRILOSEC) 40 MG capsule, Take 1 capsule (40 mg total) by mouth daily., Disp: 30 capsule, Rfl: 5 .  tiotropium (SPIRIVA) 18 MCG inhalation capsule, Place into inhaler and inhale., Disp: , Rfl:  .  tiZANidine (ZANAFLEX) 4 MG tablet, TAKE 1 TABLET (4 MG TOTAL) BY MOUTH 3 (THREE) TIMES DAILY., Disp: 90 tablet, Rfl: 2 .  venlafaxine XR (EFFEXOR-XR) 150 MG 24 hr capsule, Take 1 capsule (150 mg total) by mouth daily with breakfast., Disp: 30 capsule, Rfl: 5 .   omega-3 acid ethyl esters (LOVAZA) 1 g capsule, Take 2 capsules (2 g total) by mouth 2 (two) times daily. (Patient not taking: Reported on 06/21/2017), Disp: 120 capsule, Rfl: 5 .  OXYGEN, Inhale 2 L daily into the lungs., Disp: , Rfl:   Allergies  Allergen Reactions  . Dapsone Other (See Comments)    Hypoxemia  . Sulfa Antibiotics Rash     ROS  Constitutional: Negative for fever or weight change.  Respiratory: Negative for cough but has  shortness of breath.   Cardiovascular: Negative for chest pain but has occasional  palpitations.  Gastrointestinal: Postiive for abdominal pain when she touches her abdomen -  unchanged, no bowel changes - chronic constipation.  Musculoskeletal: Negative for gait problem or joint swelling.  Skin: Negative for rash.  Neurological: Positive for intermittent  dizziness, and  headache.  No other specific complaints in a complete review of systems (except as listed in HPI above).  Objective  Vitals:   06/21/17 1328  BP: 120/80  Pulse: 81  Resp: 14  SpO2: 95%  Weight: 183 lb 11.2 oz (83.3 kg)  Height: 5\' 4"  (1.626 m)    Body mass index is 31.53 kg/m.  Physical Exam  Constitutional: Patient appears well-developed . Obese  No distress.  HEENT: head atraumatic, normocephalic, pupils equal and reactive to light,  throat within normal limits. Pain with rom of her neck, decrease rom Cardiovascular: Normal rate, regular rhythm and normal heart sounds.  No murmur heard. No BLE edema. Pulmonary/Chest: Effort normal and breath sounds normal. No respiratory distress. Abdominal: Soft.  There is no tenderness. Psychiatric: Patient has a normal mood and affect. behavior is normal. Judgment and thought content normal.   Recent Results (from the past 2160 hour(s))  PapIG, HPV, rfx 16/18     Status: Abnormal   Collection Time: 05/09/17  4:47 PM  Result Value Ref Range   DIAGNOSIS: Comment (A)     Comment: EPITHELIAL CELL ABNORMALITY. ATYPICAL SQUAMOUS  CELLS OF UNDETERMINED SIGNIFICANCE.    Specimen adequacy: Comment     Comment: Satisfactory for evaluation. No endocervical component is identified.   Clinician Provided ICD10 Comment     Comment: D22.025 R87.610 R87.615    Performed by: Comment     Comment: Ranell Patrick, Cytotechnologist (ASCP)   Electronically signed by: Comment     Comment: Rowland Lathe, MD, Pathologist   PAP Smear Comment .    PATHOLOGIST PROVIDED ICD10: Comment     Comment: R87.610   Note: Comment     Comment: The Pap smear is a screening test designed to aid in the detection of premalignant and malignant conditions of the uterine cervix.  It is not a diagnostic procedure and should not be used as the sole means of detecting cervical cancer.  Both false-positive and false-negative reports do occur.    Test Methodology Comment     Comment: This liquid based ThinPrep(R) pap test was screened with the use of an image guided system.    HPV, high-risk Negative Negative    Comment: This high-risk HPV test detects thirteen high-risk types (16/18/31/33/35/39/45/51/52/56/58/59/68) without differentiation.   Urinalysis, Complete     Status: Abnormal   Collection Time: 06/06/17  1:52 PM  Result Value Ref Range   Specific Gravity, UA <1.005 (L) 1.005 - 1.030   pH, UA 6.0 5.0 - 7.5   Color, UA Yellow Yellow   Appearance Ur Clear Clear   Leukocytes, UA Trace (A) Negative   Protein, UA Negative Negative/Trace   Glucose, UA Negative Negative   Ketones, UA Negative Negative   RBC, UA Negative Negative   Bilirubin, UA Negative Negative   Urobilinogen, Ur 0.2 0.2 - 1.0 mg/dL   Nitrite, UA Negative Negative  TSH     Status: None   Collection Time: 06/06/17  3:39 PM  Result Value Ref Range   TSH 1.33 0.40 - 4.50 mIU/L  CBC     Status: None   Collection Time: 06/06/17  3:39 PM  Result Value Ref Range   WBC 5.8 3.8 - 10.8 Thousand/uL   RBC 4.24 3.80 - 5.10 Million/uL   Hemoglobin 12.7 11.7 - 15.5 g/dL  HCT 37.0 35.0 - 45.0 %   MCV 87.3 80.0 - 100.0 fL   MCH 30.0 27.0 - 33.0 pg   MCHC 34.3 32.0 - 36.0 g/dL   RDW 13.9 11.0 - 15.0 %   Platelets 280 140 - 400 Thousand/uL   MPV 9.4 7.5 - 12.5 fL  Lipid panel     Status: Abnormal   Collection Time: 06/06/17  3:39 PM  Result Value Ref Range   Cholesterol 237 (H) <200 mg/dL   HDL 39 (L) >50 mg/dL   Triglycerides 606 (H) <150 mg/dL   LDL Cholesterol (Calc)  mg/dL (calc)    Comment: . LDL cholesterol not calculated. Triglyceride levels greater than 400 mg/dL invalidate calculated LDL results. . Reference range: <100 . Desirable range <100 mg/dL for primary prevention;   <70 mg/dL for patients with CHD or diabetic patients  with > or = 2 CHD risk factors. Marland Kitchen LDL-C is now calculated using the Martin-Hopkins  calculation, which is a validated novel method providing  better accuracy than the Friedewald equation in the  estimation of LDL-C.  [name]. 4081;448(18): 2061-2068  (http://education.[employer].com/faq/FAQ164)    Total CHOL/HDL Ratio 6.1 (H) <5.0 (calc)   Non-HDL Cholesterol (Calc) 198 (H) <130 mg/dL (calc)    Comment: For patients with diabetes plus 1 major ASCVD risk  factor, treating to a non-HDL-C goal of <100 mg/dL  (LDL-C of <70 mg/dL) is considered a therapeutic  option.   COMPLETE METABOLIC PANEL WITH GFR     Status: Abnormal   Collection Time: 06/06/17  3:39 PM  Result Value Ref Range   Glucose, Bld 89 65 - 99 mg/dL    Comment: .            Fasting reference interval .    BUN 9 7 - 25 mg/dL   Creat 1.05 (H) 0.50 - 0.99 mg/dL    Comment: For patients >54 years of age, the reference limit for Creatinine is approximately 13% higher for people identified as African-American. .    GFR, Est Non African American 57 (L) > OR = 60 mL/min/1.93m2   GFR, Est African American 66 > OR = 60 mL/min/1.54m2   BUN/Creatinine Ratio 9 6 - 22 (calc)   Sodium 141 135 - 146 mmol/L   Potassium 4.1 3.5 - 5.3  mmol/L   Chloride 102 98 - 110 mmol/L   CO2 31 20 - 32 mmol/L   Calcium 10.0 8.6 - 10.4 mg/dL   Total Protein 7.4 6.1 - 8.1 g/dL   Albumin 4.9 3.6 - 5.1 g/dL   Globulin 2.5 1.9 - 3.7 g/dL (calc)   AG Ratio 2.0 1.0 - 2.5 (calc)   Total Bilirubin 0.4 0.2 - 1.2 mg/dL   Alkaline phosphatase (APISO) 63 33 - 130 U/L   AST 33 10 - 35 U/L   ALT 47 (H) 6 - 29 U/L       PHQ2/9: Depression screen [facility] 2/9 06/21/2017 12/22/2016 10/13/2016 09/24/2016 07/14/2016  Decreased Interest 2 0 1 0 3  Down, Depressed, Hopeless 1 0 3 1 3   PHQ - 2 Score 3 0 4 1 6   Altered sleeping 3 - 1 - 3  Tired, decreased energy 0 - 3 - 3  Change in appetite 1 - 1 - 0  Feeling bad or failure about yourself  0 - 1 - 3  Trouble concentrating 0 - 0 - 0  Moving slowly or fidgety/restless 0 - 0 - 3  Suicidal thoughts 0 -  0 - 0  PHQ-9 Score 7 - 10 - 18  Difficult doing work/chores - - Somewhat difficult - Not difficult at all  Some recent data might be hidden    Fall Risk: Fall Risk  06/21/2017 06/06/2017 12/22/2016 10/13/2016 09/24/2016  Falls in the past year? No No No Yes Yes  Number falls in past yr: - - - 1 1  Injury with Fall? - - - No No  Follow up - - - - -      Assessment & Plan  1. Hypertension, benign  - hydrochlorothiazide (HYDRODIURIL) 25 MG tablet; Take 0.5 tablets (12.5 mg total) daily by mouth.  Dispense: 30 tablet; Refill: 1  2. GAD (generalized anxiety disorder)  She is going to wean self off alprazolam.  3. High triglycerides  - omega-3 acid ethyl esters (LOVAZA) 1 g capsule; Take 2 capsules (2 g total) 2 (two) times daily by mouth.  Dispense: 120 capsule; Refill: 5

## 2017-06-23 ENCOUNTER — Encounter: Payer: Self-pay | Admitting: *Deleted

## 2017-07-04 ENCOUNTER — Ambulatory Visit (INDEPENDENT_AMBULATORY_CARE_PROVIDER_SITE_OTHER): Payer: Medicaid Other | Admitting: Urology

## 2017-07-04 ENCOUNTER — Encounter: Payer: Self-pay | Admitting: Urology

## 2017-07-04 VITALS — BP 126/84 | HR 102 | Ht 64.0 in | Wt 178.7 lb

## 2017-07-04 DIAGNOSIS — R32 Unspecified urinary incontinence: Secondary | ICD-10-CM

## 2017-07-04 DIAGNOSIS — N3946 Mixed incontinence: Secondary | ICD-10-CM | POA: Diagnosis not present

## 2017-07-04 LAB — URINALYSIS, COMPLETE
BILIRUBIN UA: NEGATIVE
GLUCOSE, UA: NEGATIVE
KETONES UA: NEGATIVE
Nitrite, UA: NEGATIVE
PROTEIN UA: NEGATIVE
RBC UA: NEGATIVE
SPEC GRAV UA: 1.01 (ref 1.005–1.030)
Urobilinogen, Ur: 0.2 mg/dL (ref 0.2–1.0)
pH, UA: 6 (ref 5.0–7.5)

## 2017-07-04 LAB — MICROSCOPIC EXAMINATION

## 2017-07-04 MED ORDER — FESOTERODINE FUMARATE ER 4 MG PO TB24: 4.0000 mg | ORAL_TABLET | Freq: Every day | ORAL | 11 refills | Status: DC

## 2017-07-04 MED ORDER — LIDOCAINE HCL 2 % EX GEL
1.0000 "application " | Freq: Once | CUTANEOUS | Status: AC
  Administered 2017-07-04: 1 via URETHRAL

## 2017-07-04 MED ORDER — CIPROFLOXACIN HCL 500 MG PO TABS
500.0000 mg | ORAL_TABLET | Freq: Once | ORAL | Status: AC
  Administered 2017-07-04: 500 mg via ORAL

## 2017-07-04 NOTE — Progress Notes (Addendum)
07/04/2017 11:15 AM   Judy Allen 04-04-1956 [id number]  Referring provider: Steele Sizer, MD [address] [place], [place] [address]  Chief Complaint  Patient presents with  . Cysto    HPI: I was consulted to assess the patient's urinary incontinence worsening over a few months.  In the last year or so she had anal cancer with chemotherapy and radiation but no surgery.  She can leak without awareness but also describes urgency and trying to undress prior to urinating.  She denies stress incontinence.  She denied bedwetting but then noted a foul-smelling discharge from the urethra she thinks.  She tends towards constipation.  She may have a vaginal discharge that is clear.  She wears 2 pads a day sometimes damp sometimes moderately wet  She has had 2 neck surgery and one lower back operation.  She has not had a hysterectomy  GU: On pelvic examination the patient had moderately severe vaginal atrophy.  I saw no vaginitis discharge blood or urine in the vaginal vault.  She had a grade 1 or small grade 2 cystocele.  She had elevated tissue underneath the urethra that was nontender and she probably does not have a urethral diverticulum but she could.  She had a small distal rectocele.  The anus looked within normal limits  The patient has urinary incontinence not associated with awareness.  She denies stress incontinence but I believe she also has urgency incontinence.  She has had pelvic radiation.  Her degree of leakage and pelvic exam is not in keeping with the fistula.  The patient did not have blood in the urine but I sent the urine for culture.  I like to see the patient on Myrbetriq 25 mg in about 1 month for cystoscopy.  We will proceed accordingly.  I may or may not order urodynamics immediately.  I did mention the test today  Today The patient was 80% improved on Myrbetriq but Medicaid will not cover it.  Frequency stable.  Cystoscopy: Utilizing sterile technique  patient underwent flexible cystoscopy.  The bladder mucosa and trigone were normal.  There was no radiation cystitis.  There is no carcinoma.  There was no fistula.  She tolerated the procedure well  PMH: Past Medical History:  Diagnosis Date  . Anemia    H/O  . Anxiety   . Arthritis   . Colon cancer ([place]) 2017   anal ca/chemo and rad  . Complication of anesthesia   . Constipation   . Dysrhythmia    TACHYCARDIA-WELL CONTROLLED ON METOPROLO  . GERD (gastroesophageal reflux disease)   . H/O allergic rhinitis   . Heart murmur   . Hemorrhoid   . Hyperlipidemia   . Hypertension   . Neck pain, chronic   . Neuritis   . Ovarian failure   . Personal history of chemotherapy   . Personal history of radiation therapy   . PONV (postoperative nausea and vomiting)   . Proteinuria   . Psoriasis   . Sleep apnea 09/12/2016   8cm H2O with 21pm oxygen mask: Eson size Medium. Heated humidifier for nasal dryness.  . Tachycardia, paroxysmal ([place])   . Urinary incontinence     Surgical History: Past Surgical History:  Procedure Laterality Date  . ANTERIOR FUSION LUMBAR SPINE    . CARDIAC CATHETERIZATION  2009?    [facility]  . CARDIAC CATHETERIZATION  05/2012   [facility]HC: Mild hypertension 37/12 with a mean pressure of 21 mm mercury  . COLONOSCOPY  2008  . herniated disc repair    . MASS EXCISION Left 02/23/2016   Procedure: EXCISION MASS;  Surgeon: Christene Lye, MD;  Location: [facility] ORS;  Service: General;  Laterality: Left;  . NECK SURGERY    . PORTACATH PLACEMENT Left 03/12/2016   Procedure: INSERTION PORT-A-CATH;  Surgeon: Christene Lye, MD;  Location: [facility] ORS;  Service: General;  Laterality: Left;  . tonsillectomy  1959   history  . TUBAL LIGATION     s/p    Home Medications:  Allergies as of 07/04/2017      Reactions   Dapsone Other (See Comments)   Hypoxemia   Sulfa Antibiotics Rash      Medication List        Accurate as of 07/04/17 11:15 AM. Always use your  most recent med list.          albuterol 108 (90 Base) MCG/ACT inhaler Commonly known as:  PROVENTIL HFA;VENTOLIN HFA Inhale 2 puffs 4 (four) times daily into the lungs.   alclomethasone 0.05 % cream Commonly known as:  ACLOVATE 1(ONE) APPLICATION(S) TOPICAL EVERY DAY   ALPRAZolam 0.5 MG 24 hr tablet Commonly known as:  XANAX XR Take 1 tablet (0.5 mg total) by mouth every morning. Fill August 22nd and monthly thereafter   clobetasol cream 0.05 % Commonly known as:  TEMOVATE Apply topically 2 (two) times daily.   [facility]S B-12 1000 MCG Tbcr Generic drug:  Cyanocobalamin TAKE 1 TABLET (1,000 MCG TOTAL) BY MOUTH DAILY.   divalproex 250 MG DR tablet Commonly known as:  DEPAKOTE Take 1 tablet (250 mg total) by mouth at bedtime.   gabapentin 300 MG capsule Commonly known as:  NEURONTIN Take 1 capsule (300 mg total) by mouth 2 (two) times daily.   hydrochlorothiazide 25 MG tablet Commonly known as:  HYDRODIURIL Take 0.5 tablets (12.5 mg total) daily by mouth.   HYDROcodone-acetaminophen 5-325 MG tablet Commonly known as:  NORCO Take 1 tablet by mouth 2 (two) times daily as needed for moderate pain.   HYDROcodone-acetaminophen 5-325 MG tablet Commonly known as:  NORCO/VICODIN Take 1 tablet by mouth 2 (two) times daily.   HYDROcodone-acetaminophen 5-325 MG tablet Commonly known as:  NORCO/VICODIN Take 1 tablet by mouth 2 (two) times daily.   metoprolol succinate 50 MG 24 hr tablet Commonly known as:  TOPROL-XL Take 1 tablet (50 mg total) by mouth daily.   mirabegron ER 25 MG Tb24 tablet Commonly known as:  MYRBETRIQ Take 1 tablet (25 mg total) by mouth daily.   omega-3 acid ethyl esters 1 g capsule Commonly known as:  LOVAZA Take 2 capsules (2 g total) 2 (two) times daily by mouth.   omeprazole 40 MG capsule Commonly known as:  PRILOSEC Take 1 capsule (40 mg total) by mouth daily.   OXYGEN Inhale 2 L daily into the lungs.   SYMBICORT 160-4.5 MCG/ACT  inhaler Generic drug:  budesonide-formoterol Inhale 2 puffs 2 (two) times daily into the lungs.   tiotropium 18 MCG inhalation capsule Commonly known as:  [place] into inhaler and inhale.   tiZANidine 4 MG tablet Commonly known as:  ZANAFLEX TAKE 1 TABLET (4 MG TOTAL) BY MOUTH 3 (THREE) TIMES DAILY.   venlafaxine XR 150 MG 24 hr capsule Commonly known as:  EFFEXOR-XR Take 1 capsule (150 mg total) by mouth daily with breakfast.       Allergies:  Allergies  Allergen Reactions  . Dapsone Other (See Comments)    Hypoxemia  . Sulfa  Antibiotics Rash    Family History: Family History  Problem Relation Age of Onset  . Heart attack Mother   . Breast cancer Maternal Aunt   . Bladder Cancer Neg Hx   . Kidney cancer Neg Hx     Social History:  reports that she quit smoking about 11 years ago. Her smoking use included cigarettes. She started smoking about 53 years ago. She has a 32.00 pack-year smoking history. she has never used smokeless tobacco. She reports that she does not drink alcohol or use drugs.  ROS: UROLOGY Frequent Urination?: No Hard to postpone urination?: No Burning/pain with urination?: No Get up at night to urinate?: Yes Leakage of urine?: Yes Urine stream starts and stops?: No Trouble starting stream?: No Do you have to strain to urinate?: No Blood in urine?: No Urinary tract infection?: No Sexually transmitted disease?: No Injury to kidneys or bladder?: No Painful intercourse?: No Weak stream?: No Currently pregnant?: No Vaginal bleeding?: No Last menstrual period?: n  Gastrointestinal Nausea?: No Vomiting?: No Indigestion/heartburn?: No Diarrhea?: No Constipation?: No  Constitutional Fever: No Night sweats?: No Weight loss?: No Fatigue?: No  Skin Skin rash/lesions?: Yes Itching?: No  Eyes Blurred vision?: No Double vision?: No  Ears/Nose/Throat Sore throat?: No Sinus problems?: No  Hematologic/Lymphatic Swollen glands?:  No Easy bruising?: No  Cardiovascular Leg swelling?: No Chest pain?: No  Respiratory Cough?: No Shortness of breath?: Yes  Endocrine Excessive thirst?: No  Musculoskeletal Back pain?: Yes Joint pain?: No  Neurological Headaches?: No Dizziness?: No  Psychologic Depression?: Yes Anxiety?: Yes  Physical Exam: BP 126/84   Pulse (!) 102   Ht 5\' 4"  (1.626 m)   Wt 178 lb 11.2 oz (81.1 kg)   LMP 03/04/2002 (Approximate)   BMI 30.67 kg/m   Constitutional:  Alert and oriented, No acute distress.  Laboratory Data: Lab Results  Component Value Date   WBC 5.8 06/06/2017   HGB 12.7 06/06/2017   HCT 37.0 06/06/2017   MCV 87.3 06/06/2017   PLT 280 06/06/2017    Lab Results  Component Value Date   CREATININE 1.05 (H) 06/06/2017    No results found for: PSA  No results found for: TESTOSTERONE  Lab Results  Component Value Date   HGBA1C 5.7 (H) 03/03/2017    Urinalysis    Component Value Date/Time   COLORURINE STRAW (A) 11/02/2015 1227   APPEARANCEUR Clear 06/06/2017 1352   LABSPEC 1.008 11/02/2015 1227   LABSPEC 1.005 06/08/2012 1117   PHURINE 7.0 11/02/2015 1227   GLUCOSEU Negative 06/06/2017 1352   GLUCOSEU Negative 06/08/2012 1117   HGBUR NEGATIVE 11/02/2015 1227   BILIRUBINUR Negative 06/06/2017 1352   BILIRUBINUR Negative 06/08/2012 1117   KETONESUR NEGATIVE 11/02/2015 1227   PROTEINUR Negative 06/06/2017 1352   PROTEINUR NEGATIVE 11/02/2015 1227   NITRITE Negative 06/06/2017 1352   NITRITE NEGATIVE 11/02/2015 1227   LEUKOCYTESUR Trace (A) 06/06/2017 1352   LEUKOCYTESUR Negative 06/08/2012 1117    Pertinent Imaging: none  Assessment & Plan: The response to Myrbetriq favors an overactive bladder as the underlying etiology.  I will not order urodynamics at this stage.  I gave her Vesicare 5 mg samples and prescription and reassess in 4-6 weeks  TOVIAZ 4 MG GIVEN DUE TO SAMPLES  1. Urinary incontinence, unspecified type  - Urinalysis,  Complete - ciprofloxacin (CIPRO) tablet 500 mg - lidocaine (XYLOCAINE) 2 % jelly 1 application   No Follow-up on file.  Reece Packer, MD  [facility] [address]  [place], [place] [address] [phone]

## 2017-07-04 NOTE — Addendum Note (Signed)
Addended by: Kyra Manges on: 07/04/2017 11:39 AM   Modules accepted: Orders

## 2017-07-05 ENCOUNTER — Ambulatory Visit: Payer: Self-pay | Admitting: General Surgery

## 2017-07-09 ENCOUNTER — Encounter: Payer: Self-pay | Admitting: Family Medicine

## 2017-07-11 ENCOUNTER — Telehealth: Payer: Self-pay

## 2017-07-11 MED ORDER — LANSOPRAZOLE 30 MG PO CPDR: 30.0000 mg | DELAYED_RELEASE_CAPSULE | Freq: Every day | ORAL | 1 refills | Status: DC

## 2017-07-11 NOTE — Telephone Encounter (Signed)
Called patient and she about the medications change. She is ok switching to Lansoprazole. She will need a 90 day supply sent to [facility]. She picked up the Temazepam. She will need a different sleep medication sent to her mail order because she does not want to continue to get a 30 day supply. The only way her insurance will cover medication if it is a 90 day supply.

## 2017-07-11 NOTE — Telephone Encounter (Signed)
Sent Lanzoprazole to mail order, however she said Trazodone does not work and she is already on a lot of other sedating medication. Can she try otc Melatonin? Not sure on how to help her

## 2017-07-12 ENCOUNTER — Other Ambulatory Visit: Payer: Self-pay | Admitting: Family Medicine

## 2017-07-12 DIAGNOSIS — I10 Essential (primary) hypertension: Secondary | ICD-10-CM

## 2017-07-12 MED ORDER — HYDROCHLOROTHIAZIDE 25 MG PO TABS: 25.0000 mg | ORAL_TABLET | Freq: Every day | ORAL | 0 refills | Status: DC

## 2017-07-13 ENCOUNTER — Ambulatory Visit: Payer: Medicaid Other | Admitting: General Surgery

## 2017-07-13 ENCOUNTER — Encounter: Payer: Self-pay | Admitting: General Surgery

## 2017-07-13 VITALS — BP 130/74 | HR 68 | Resp 18 | Ht 64.0 in | Wt 176.0 lb

## 2017-07-13 DIAGNOSIS — C799 Secondary malignant neoplasm of unspecified site: Secondary | ICD-10-CM

## 2017-07-13 DIAGNOSIS — C210 Malignant neoplasm of anus, unspecified: Secondary | ICD-10-CM | POA: Diagnosis not present

## 2017-07-13 DIAGNOSIS — IMO0002 Reserved for concepts with insufficient information to code with codable children: Secondary | ICD-10-CM

## 2017-07-13 NOTE — Progress Notes (Signed)
Patient ID: Judy Allen, female   DOB: [birth year], 61 y.o.   MRN: [id number]  Chief Complaint  Patient presents with  . Other    HPI Judy Allen is a 61 y.o. female here today for her follow up anal skin cancer. Patient states she is doing well. No noted bleeding with BM.  HPI  Past Medical History:  Diagnosis Date  . Anemia    H/O  . Anxiety   . Arthritis   . Colon cancer ([place]) 2017   anal ca/chemo and rad  . Complication of anesthesia   . Constipation   . Dysrhythmia    TACHYCARDIA-WELL CONTROLLED ON METOPROLO  . GERD (gastroesophageal reflux disease)   . H/O allergic rhinitis   . Heart murmur   . Hemorrhoid   . Hyperlipidemia   . Hypertension   . Neck pain, chronic   . Neuritis   . Ovarian failure   . Personal history of chemotherapy   . Personal history of radiation therapy   . PONV (postoperative nausea and vomiting)   . Proteinuria   . Psoriasis   . Sleep apnea 09/12/2016   8cm H2O with 21pm oxygen mask: Eson size Medium. Heated humidifier for nasal dryness.  . Tachycardia, paroxysmal ([place])   . Urinary incontinence     Past Surgical History:  Procedure Laterality Date  . ANTERIOR FUSION LUMBAR SPINE    . CARDIAC CATHETERIZATION  2009?    [facility];[place]  . CARDIAC CATHETERIZATION  05/2012   [facility]: Mild hypertension 37/12 with a mean pressure of 21 mm mercury  . COLONOSCOPY  2008  . herniated disc repair    . MASS EXCISION Left 02/23/2016   Procedure: EXCISION MASS;  Surgeon: Christene Lye, MD;  Location: [facility] ORS;  Service: General;  Laterality: Left;  . NECK SURGERY    . PORTACATH PLACEMENT Left 03/12/2016   Procedure: INSERTION PORT-A-CATH;  Surgeon: Christene Lye, MD;  Location: [facility] ORS;  Service: General;  Laterality: Left;  . tonsillectomy  1959   history  . TUBAL LIGATION     s/p    Family History  Problem Relation Age of Onset  . Heart attack Mother   . Breast cancer Maternal Aunt   . Bladder Cancer Neg Hx   . Kidney cancer  Neg Hx     Social History Social History   Tobacco Use  . Smoking status: Former Smoker    Packs/day: 1.00    Years: 32.00    Pack years: 32.00    Types: Cigarettes    Start date: 08/10/1963    Last attempt to quit: 01/15/2006    Years since quitting: 11.5  . Smokeless tobacco: Never Used  Substance Use Topics  . Alcohol use: No    Alcohol/week: 0.0 oz  . Drug use: No    Allergies  Allergen Reactions  . Dapsone Other (See Comments)    Hypoxemia  . Sulfa Antibiotics Rash    Current Outpatient Medications  Medication Sig Dispense Refill  . alclomethasone (ACLOVATE) 0.05 % cream 1(ONE) APPLICATION(S) TOPICAL EVERY DAY  1  . ALPRAZolam (XANAX XR) 0.5 MG 24 hr tablet Take 1 tablet (0.5 mg total) by mouth every morning. Fill August 22nd and monthly thereafter 30 tablet 2  . budesonide-formoterol (SYMBICORT) 160-4.5 MCG/ACT inhaler Inhale 2 puffs 2 (two) times daily into the lungs.    . clobetasol cream (TEMOVATE) 0.05 % Apply topically 2 (two) times daily. 60 g 2  . CVS B-12 1000 MCG  TBCR TAKE 1 TABLET (1,000 MCG TOTAL) BY MOUTH DAILY.  11  . divalproex (DEPAKOTE) 250 MG DR tablet Take 1 tablet (250 mg total) by mouth at bedtime. 30 tablet 5  . fesoterodine (TOVIAZ) 4 MG TB24 tablet Take 1 tablet (4 mg total) by mouth daily. 30 tablet 11  . gabapentin (NEURONTIN) 300 MG capsule Take 1 capsule (300 mg total) by mouth 2 (two) times daily. 180 capsule 1  . hydrochlorothiazide (HYDRODIURIL) 25 MG tablet Take 1 tablet (25 mg total) by mouth daily. 30 tablet 0  . HYDROcodone-acetaminophen (NORCO) 5-325 MG tablet Take 1 tablet by mouth 2 (two) times daily as needed for moderate pain. 60 tablet 0  . HYDROcodone-acetaminophen (NORCO/VICODIN) 5-325 MG tablet Take 1 tablet by mouth 2 (two) times daily. 60 tablet 0  . HYDROcodone-acetaminophen (NORCO/VICODIN) 5-325 MG tablet Take 1 tablet by mouth 2 (two) times daily. 60 tablet 0  . lansoprazole (PREVACID) 30 MG capsule Take 1 capsule (30 mg  total) by mouth daily at 12 noon. 90 capsule 1  . metoprolol succinate (TOPROL-XL) 50 MG 24 hr tablet Take 1 tablet (50 mg total) by mouth daily. 30 tablet 5  . mirabegron ER (MYRBETRIQ) 25 MG TB24 tablet Take 1 tablet (25 mg total) by mouth daily. 30 tablet 11  . omega-3 acid ethyl esters (LOVAZA) 1 g capsule Take 2 capsules (2 g total) 2 (two) times daily by mouth. 120 capsule 5  . OXYGEN Inhale 2 L daily into the lungs.    . tiotropium (SPIRIVA) 18 MCG inhalation capsule Place into inhaler and inhale.    Marland Kitchen tiZANidine (ZANAFLEX) 4 MG tablet TAKE 1 TABLET (4 MG TOTAL) BY MOUTH 3 (THREE) TIMES DAILY. 90 tablet 2  . venlafaxine XR (EFFEXOR-XR) 150 MG 24 hr capsule Take 1 capsule (150 mg total) by mouth daily with breakfast. 30 capsule 5   No current facility-administered medications for this visit.     Review of Systems Review of Systems  Constitutional: Negative.   Respiratory: Negative.   Cardiovascular: Negative.     Blood pressure 130/74, pulse 68, resp. rate 18, height 5' 4"  (1.626 m), weight 176 lb (79.8 kg), last menstrual period 03/04/2002.  Physical Exam Physical Exam  Constitutional: She is oriented to person, place, and time. She appears well-developed and well-nourished.  HENT:  Mouth/Throat: Oropharynx is clear and moist.  Eyes: Conjunctivae are normal. No scleral icterus.  Neck: Neck supple.  Cardiovascular: Normal rate, regular rhythm and normal heart sounds.  Pulmonary/Chest: Effort normal and breath sounds normal.  Abdominal: Soft. Normal appearance. There is no tenderness.  Genitourinary:  Genitourinary Comments: 1.5 cm Skin tag 7 o'clock location. Radiation skin changes around perianal area  Lymphadenopathy:    She has no cervical adenopathy.    She has no axillary adenopathy.       Right: No inguinal adenopathy present.       Left: No inguinal adenopathy present.  Neurological: She is alert and oriented to person, place, and time.  Skin: Skin is warm and dry.   Psychiatric: Her behavior is normal.    Data Reviewed Prior notes reviewed   Assessment    SCC involving anal skin, mets to left inguinal node, she completed her treatment in October 2017. Exam is stable with no evidence of recurrence is or the anal area or in the groin     Plan    Follow up as needed.     HPI, Physical Exam, Assessment and Plan have been scribed  under the direction and in the presence of Mckinley Jewel, MD Karie Fetch, RN I have completed the exam and reviewed the above documentation for accuracy and completeness.  I agree with the above.  Haematologist has been used and any errors in dictation or transcription are unintentional.  Seeplaputhur G. Jamal Collin, M.D., F.A.C.S.   Junie Panning G 07/22/2017, 11:19 AM

## 2017-07-13 NOTE — Patient Instructions (Signed)
The patient is aware to call back for any questions or concerns.  

## 2017-08-03 ENCOUNTER — Ambulatory Visit: Payer: Medicaid Other | Admitting: Family Medicine

## 2017-08-03 ENCOUNTER — Encounter: Payer: Self-pay | Admitting: Family Medicine

## 2017-08-03 VITALS — BP 122/68 | HR 81 | Temp 98.4°F | Resp 16 | Ht 64.0 in | Wt 182.5 lb

## 2017-08-03 DIAGNOSIS — E781 Pure hyperglyceridemia: Secondary | ICD-10-CM | POA: Diagnosis not present

## 2017-08-03 DIAGNOSIS — F40298 Other specified phobia: Secondary | ICD-10-CM | POA: Diagnosis not present

## 2017-08-03 DIAGNOSIS — R110 Nausea: Secondary | ICD-10-CM | POA: Diagnosis not present

## 2017-08-03 DIAGNOSIS — H6121 Impacted cerumen, right ear: Secondary | ICD-10-CM | POA: Diagnosis not present

## 2017-08-03 DIAGNOSIS — K8020 Calculus of gallbladder without cholecystitis without obstruction: Secondary | ICD-10-CM

## 2017-08-03 DIAGNOSIS — H9201 Otalgia, right ear: Secondary | ICD-10-CM

## 2017-08-03 DIAGNOSIS — K760 Fatty (change of) liver, not elsewhere classified: Secondary | ICD-10-CM

## 2017-08-03 DIAGNOSIS — H6982 Other specified disorders of Eustachian tube, left ear: Secondary | ICD-10-CM | POA: Diagnosis not present

## 2017-08-03 DIAGNOSIS — H6992 Unspecified Eustachian tube disorder, left ear: Secondary | ICD-10-CM

## 2017-08-03 DIAGNOSIS — J309 Allergic rhinitis, unspecified: Secondary | ICD-10-CM | POA: Diagnosis not present

## 2017-08-03 DIAGNOSIS — K219 Gastro-esophageal reflux disease without esophagitis: Secondary | ICD-10-CM

## 2017-08-03 MED ORDER — FLUTICASONE PROPIONATE 50 MCG/ACT NA SUSP: 2.0000 | Freq: Every day | NASAL | 6 refills | Status: DC

## 2017-08-03 NOTE — Progress Notes (Signed)
Name: Judy Allen   MRN: [id number]    DOB: 10-Nov-1955   Date:08/03/2017       Progress Note  Subjective  Chief Complaint  Chief Complaint  Patient presents with  . Ear Pain    patient stated for the past 3-4 days she has had left ear pain and pressure. no otc meds taken or used. the pain radiates down to throat. patient is sensitive to loud noises. no drainage.  . Nausea    patient stated that every time that she eats she gets nauseated    HPI  LEFT ear pain:  x3-4 months and constant.  She notes that she thinks she felt a small preauricular lump a a week ago, but this has since gone away.  Phonophobic to the left side only; she also notes she cannot sleep on that side without having pain.  No changes in her hearing, no recent illness, no tinnitus or headaches, or abnormal dizziness.  She does have hx AR, but denies nasal congestion, has not tried any OTC medications for ear pain.   Nausea: Whenever she eats she becomes nauseated x2-3 weeks. She has not changed her diet. Denies regurgitation, vomiting, diarrhea, constipation, abdominal pain, fevers/chills, or urinary symptoms. Has no hx of similar symptoms. She has hx GERD and takes Prevacid.  She has Hx RUQ pain, PET scan in April 2018 showed gallstone - further testing was not approved by insurance.  She also has high triglycerides and has been taking Lovaza.  Also has hx fatty liver. - Saw Dr. Allen Norris 01/24/2017 for fatty liver and generalized abdominal pain - was told to follow up as needed.   - Has taken antiemetic in the past - she is unsure which it was, but states it "messed with my heart".  Patient Active Problem List   Diagnosis Date Noted  . Gallstones 12/22/2016  . Fatty liver 12/22/2016  . Chronic respiratory failure with hypoxia, on home oxygen therapy ([place]) 07/28/2016  . Centrilobular emphysema ([place]) 07/14/2016  . Abnormal Pap smear of cervix 04/14/2016  . Anal squamous cell carcinoma ([place]) 03/09/2016  . Metastatic  squamous cell carcinoma ([place]) 02/25/2016  . Non-thrombocytopenic purpura ([place]) 01/12/2016  . Chronic constipation 10/16/2015  . History of fusion of cervical spine 04/02/2015  . Benign hypertension 01/16/2015  . Chronic cervical pain 01/16/2015  . CN (constipation) 01/16/2015  . Gastric reflux 01/16/2015  . Hypertriglyceridemia 01/16/2015  . Edema leg 01/16/2015  . Chronic recurrent major depressive disorder ([place]) 01/16/2015  . Dysmetabolic syndrome 32/35/5732  . Obstructive apnea 01/16/2015  . Psoriasis 01/16/2015  . Allergic rhinitis 01/16/2015  . Bursitis, trochanteric 01/16/2015  . GAD (generalized anxiety disorder) 01/16/2015  . Tachycardia, paroxysmal ([facility])     Social History   Tobacco Use  . Smoking status: Former Smoker    Packs/day: 1.00    Years: 32.00    Pack years: 32.00    Types: Cigarettes    Start date: 08/10/1963    Last attempt to quit: 01/15/2006    Years since quitting: 11.5  . Smokeless tobacco: Never Used  Substance Use Topics  . Alcohol use: No    Alcohol/week: 0.0 oz     Current Outpatient Medications:  .  alclomethasone (ACLOVATE) 0.05 % cream, 1(ONE) APPLICATION(S) TOPICAL EVERY DAY, Disp: , Rfl: 1 .  budesonide-formoterol (SYMBICORT) 160-4.5 MCG/ACT inhaler, Inhale 2 puffs 2 (two) times daily into the lungs., Disp: , Rfl:  .  clobetasol cream (TEMOVATE) 0.05 %, Apply topically 2 (two) times  daily., Disp: 60 g, Rfl: 2 .  CVS B-12 1000 MCG TBCR, TAKE 1 TABLET (1,000 MCG TOTAL) BY MOUTH DAILY., Disp: , Rfl: 11 .  divalproex (DEPAKOTE) 250 MG DR tablet, Take 1 tablet (250 mg total) by mouth at bedtime., Disp: 30 tablet, Rfl: 5 .  fesoterodine (TOVIAZ) 4 MG TB24 tablet, Take 1 tablet (4 mg total) by mouth daily., Disp: 30 tablet, Rfl: 11 .  gabapentin (NEURONTIN) 300 MG capsule, Take 1 capsule (300 mg total) by mouth 2 (two) times daily., Disp: 180 capsule, Rfl: 1 .  hydrochlorothiazide (HYDRODIURIL) 25 MG tablet, Take 1 tablet (25 mg total) by mouth  daily., Disp: 30 tablet, Rfl: 0 .  HYDROcodone-acetaminophen (NORCO) 5-325 MG tablet, Take 1 tablet by mouth 2 (two) times daily as needed for moderate pain., Disp: 60 tablet, Rfl: 0 .  HYDROcodone-acetaminophen (NORCO/VICODIN) 5-325 MG tablet, Take 1 tablet by mouth 2 (two) times daily., Disp: 60 tablet, Rfl: 0 .  HYDROcodone-acetaminophen (NORCO/VICODIN) 5-325 MG tablet, Take 1 tablet by mouth 2 (two) times daily., Disp: 60 tablet, Rfl: 0 .  lansoprazole (PREVACID) 30 MG capsule, Take 1 capsule (30 mg total) by mouth daily at 12 noon., Disp: 90 capsule, Rfl: 1 .  metoprolol succinate (TOPROL-XL) 50 MG 24 hr tablet, Take 1 tablet (50 mg total) by mouth daily., Disp: 30 tablet, Rfl: 5 .  mirabegron ER (MYRBETRIQ) 25 MG TB24 tablet, Take 1 tablet (25 mg total) by mouth daily., Disp: 30 tablet, Rfl: 11 .  omega-3 acid ethyl esters (LOVAZA) 1 g capsule, Take 2 capsules (2 g total) 2 (two) times daily by mouth., Disp: 120 capsule, Rfl: 5 .  OXYGEN, Inhale 2 L daily into the lungs., Disp: , Rfl:  .  tiotropium (SPIRIVA) 18 MCG inhalation capsule, Place into inhaler and inhale., Disp: , Rfl:  .  tiZANidine (ZANAFLEX) 4 MG tablet, TAKE 1 TABLET (4 MG TOTAL) BY MOUTH 3 (THREE) TIMES DAILY., Disp: 90 tablet, Rfl: 2 .  venlafaxine XR (EFFEXOR-XR) 150 MG 24 hr capsule, Take 1 capsule (150 mg total) by mouth daily with breakfast., Disp: 30 capsule, Rfl: 5  Allergies  Allergen Reactions  . Dapsone Other (See Comments)    Hypoxemia  . Sulfa Antibiotics Rash    ROS Constitutional: Negative for fever or weight change.  Respiratory: Negative for cough and shortness of breath.   Cardiovascular: Negative for chest pain or palpitations.  Gastrointestinal: Negative for abdominal pain, no bowel changes.  Musculoskeletal: Negative for gait problem or joint swelling.  Skin: Negative for rash.  Neurological: Negative for dizziness or headache.  No other specific complaints in a complete review of systems (except  as listed in HPI above).  Objective  Vitals:   08/03/17 1417  BP: 122/68  Pulse: 81  Resp: 16  Temp: 98.4 F (36.9 C)  TempSrc: Oral  SpO2: 94%  Weight: 182 lb 8 oz (82.8 kg)  Height: 5\' 4"  (1.626 m)   Body mass index is 31.33 kg/m.  Nursing Note and Vital Signs reviewed.  Physical Exam Constitutional: Patient appears well-developed and well-nourished. Obese. No distress.  HEENT: head atraumatic, normocephalic, pupils equal and reactive to light, EOM's intact, RIGHT TM without erythema or bulging; LEFT TM with cerumen impaction, no maxillary or frontal sinus pain on palpation, neck supple with very mild preauricular lymphadenopathy on the LEFT only, oropharynx pink and moist without exudate.  Nose exhibits possible deviated septum with partial occlusion on the LEFT side. Cardiovascular: Normal rate, regular rhythm, S1/S2 present.  No murmur or rub heard. No BLE edema. Pulmonary/Chest: Effort normal and breath sounds clear. No respiratory distress or retractions. Abdominal: Soft with RUQ tenderness - appears to be baseline and present upon examination by Dr. Allen Norris in June 2018, bowel sounds present x4 quadrants.  No CVA Tenderness. Psychiatric: Patient has a normal mood and affect. behavior is normal. Judgment and thought content normal.  Recent Results (from the past 2160 hour(s))  PapIG, HPV, rfx 16/18     Status: Abnormal   Collection Time: 05/09/17  4:47 PM  Result Value Ref Range   DIAGNOSIS: Comment (A)     Comment: EPITHELIAL CELL ABNORMALITY. ATYPICAL SQUAMOUS CELLS OF UNDETERMINED SIGNIFICANCE.    Specimen adequacy: Comment     Comment: Satisfactory for evaluation. No endocervical component is identified.   Clinician Provided ICD10 Comment     Comment: S01.093 R87.610 R87.615    Performed by: Comment     Comment: Ranell Patrick, Cytotechnologist (ASCP)   Electronically signed by: Comment     Comment: Rowland Lathe, MD, Pathologist   PAP Smear Comment .     PATHOLOGIST PROVIDED ICD10: Comment     Comment: R87.610   Note: Comment     Comment: The Pap smear is a screening test designed to aid in the detection of premalignant and malignant conditions of the uterine cervix.  It is not a diagnostic procedure and should not be used as the sole means of detecting cervical cancer.  Both false-positive and false-negative reports do occur.    Test Methodology Comment     Comment: This liquid based ThinPrep(R) pap test was screened with the use of an image guided system.    HPV, high-risk Negative Negative    Comment: This high-risk HPV test detects thirteen high-risk types (16/18/31/33/35/39/45/51/52/56/58/59/68) without differentiation.   Urinalysis, Complete     Status: Abnormal   Collection Time: 06/06/17  1:52 PM  Result Value Ref Range   Specific Gravity, UA <1.005 (L) 1.005 - 1.030   pH, UA 6.0 5.0 - 7.5   Color, UA Yellow Yellow   Appearance Ur Clear Clear   Leukocytes, UA Trace (A) Negative   Protein, UA Negative Negative/Trace   Glucose, UA Negative Negative   Ketones, UA Negative Negative   RBC, UA Negative Negative   Bilirubin, UA Negative Negative   Urobilinogen, Ur 0.2 0.2 - 1.0 mg/dL   Nitrite, UA Negative Negative  TSH     Status: None   Collection Time: 06/06/17  3:39 PM  Result Value Ref Range   TSH 1.33 0.40 - 4.50 mIU/L  CBC     Status: None   Collection Time: 06/06/17  3:39 PM  Result Value Ref Range   WBC 5.8 3.8 - 10.8 Thousand/uL   RBC 4.24 3.80 - 5.10 Million/uL   Hemoglobin 12.7 11.7 - 15.5 g/dL   HCT 37.0 35.0 - 45.0 %   MCV 87.3 80.0 - 100.0 fL   MCH 30.0 27.0 - 33.0 pg   MCHC 34.3 32.0 - 36.0 g/dL   RDW 13.9 11.0 - 15.0 %   Platelets 280 140 - 400 Thousand/uL   MPV 9.4 7.5 - 12.5 fL  Lipid panel     Status: Abnormal   Collection Time: 06/06/17  3:39 PM  Result Value Ref Range   Cholesterol 237 (H) <200 mg/dL   HDL 39 (L) >50 mg/dL   Triglycerides 606 (H) <150 mg/dL   LDL Cholesterol (Calc)  mg/dL  (calc)    Comment: .  LDL cholesterol not calculated. Triglyceride levels greater than 400 mg/dL invalidate calculated LDL results. . Reference range: <100 . Desirable range <100 mg/dL for primary prevention;   <70 mg/dL for patients with CHD or diabetic patients  with > or = 2 CHD risk factors. Marland Kitchen LDL-C is now calculated using the Martin-Hopkins  calculation, which is a validated novel method providing  better accuracy than the Friedewald equation in the  estimation of LDL-C.  [name]. 3419;622(29): 2061-2068  (http://education.[employer].com/faq/FAQ164)    Total CHOL/HDL Ratio 6.1 (H) <5.0 (calc)   Non-HDL Cholesterol (Calc) 198 (H) <130 mg/dL (calc)    Comment: For patients with diabetes plus 1 major ASCVD risk  factor, treating to a non-HDL-C goal of <100 mg/dL  (LDL-C of <70 mg/dL) is considered a therapeutic  option.   COMPLETE METABOLIC PANEL WITH GFR     Status: Abnormal   Collection Time: 06/06/17  3:39 PM  Result Value Ref Range   Glucose, Bld 89 65 - 99 mg/dL    Comment: .            Fasting reference interval .    BUN 9 7 - 25 mg/dL   Creat 1.05 (H) 0.50 - 0.99 mg/dL    Comment: For patients >32 years of age, the reference limit for Creatinine is approximately 13% higher for people identified as African-American. .    GFR, Est Non African American 57 (L) > OR = 60 mL/min/1.2m2   GFR, Est African American 66 > OR = 60 mL/min/1.91m2   BUN/Creatinine Ratio 9 6 - 22 (calc)   Sodium 141 135 - 146 mmol/L   Potassium 4.1 3.5 - 5.3 mmol/L   Chloride 102 98 - 110 mmol/L   CO2 31 20 - 32 mmol/L   Calcium 10.0 8.6 - 10.4 mg/dL   Total Protein 7.4 6.1 - 8.1 g/dL   Albumin 4.9 3.6 - 5.1 g/dL   Globulin 2.5 1.9 - 3.7 g/dL (calc)   AG Ratio 2.0 1.0 - 2.5 (calc)   Total Bilirubin 0.4 0.2 - 1.2 mg/dL   Alkaline phosphatase (APISO) 63 33 - 130 U/L   AST 33 10 - 35 U/L   ALT 47 (H) 6 - 29 U/L  Urinalysis, Complete     Status: None   Collection  Time: 07/04/17 10:47 AM  Result Value Ref Range   Specific Gravity, UA 1.010 1.005 - 1.030   pH, UA 6.0 5.0 - 7.5   Color, UA Yellow Yellow   Appearance Ur Clear Clear   Leukocytes, UA Trace Negative   Protein, UA Negative Negative/Trace   Glucose, UA Negative Negative   Ketones, UA Negative Negative   RBC, UA Negative Negative   Bilirubin, UA Negative Negative   Urobilinogen, Ur 0.2 0.2 - 1.0 mg/dL   Nitrite, UA Negative Negative   Microscopic Examination See below:   Microscopic Examination     Status: Abnormal   Collection Time: 07/04/17 10:47 AM  Result Value Ref Range   WBC, UA 6-10 (A) 0 - 5 /hpf   RBC, UA 0-2 0 - 2 /hpf   Epithelial Cells (non renal) 0-10 0 - 10 /hpf   Renal Epithel, UA 0-10 None seen /hpf   Bacteria, UA Few (A) None seen/Few     Assessment & Plan  1. Otalgia of right ear - Ambulatory referral to ENT 2. Allergic rhinitis, unspecified seasonality, unspecified trigger - fluticasone (FLONASE) 50 MCG/ACT nasal spray; Place 2 sprays into both nostrils  daily.  Dispense: 16 g; Refill: 6 3. Dysfunction of left eustachian tube - fluticasone (FLONASE) 50 MCG/ACT nasal spray; Place 2 sprays into both nostrils daily.  Dispense: 16 g; Refill: 6 - Ambulatory referral to ENT 4. Phonophobia - Ambulatory referral to ENT  5. Nausea - COMPLETE METABOLIC PANEL WITH GFR - Lipase - CBC w/Diff/Platelet - H. pylori breath test - Advised if labs are normal, and nausea persists, we will refer back to Dr. Allen Norris for further evaluation. 6. Gastric reflux - COMPLETE METABOLIC PANEL WITH GFR - H. pylori breath test 7. Fatty liver - COMPLETE METABOLIC PANEL WITH GFR 8. Gallstones - COMPLETE METABOLIC PANEL WITH GFR - CBC w/Diff/Platelet 9. Hypertriglyceridemia - COMPLETE METABOLIC PANEL WITH GFR - Lipase  10. Impacted cerumen of right ear - Unable to visualize TM s/p lavage. Referral to ENT pending.  Advised to not insert any foreign objects or liquids into ears while  awaiting ENT referral. - Ear Lavage  -Red flags and when to present for emergency care or RTC including fever >101.90F, chest pain, shortness of breath, new/worsening/un-resolving symptoms, abdominal pain, vomiting, reviewed with patient at time of visit. Follow up and care instructions discussed and provided in AVS.

## 2017-08-03 NOTE — Patient Instructions (Addendum)
Eat small, frequent meals instead large meals. Keep food journal and note when nausea is more severe. Drink plenty of water. Nausea, Adult Feeling sick to your stomach (nausea) means that your stomach is upset or you feel like you have to throw up (vomit). Feeling sick to your stomach is usually not serious, but it may be an early sign of a more serious medical problem. As you feel sicker to your stomach, it can lead to throwing up (vomiting). If you throw up, or if you are not able to drink enough fluids, there is a risk of dehydration. Dehydration can make you feel tired and thirsty, have a dry mouth, and pee (urinate) less often. Older adults and people who have other diseases or a weak defense (immune) system have a higher risk of dehydration. The main goal of treating this condition is to:  Limit how often you feel sick to your stomach.  Prevent throwing up and dehydration.  Follow these instructions at home: Follow instructions from your doctor about how to care for yourself at home. Eating and drinking Follow these recommendations as told by your doctor:  Take an oral rehydration solution (ORS). This is a drink that is sold at pharmacies and stores.  Drink clear fluids in small amounts as you are able, such as: ? Water. ? Ice chips. ? Fruit juice that has water added (diluted fruit juice). ? Low-calorie sports drinks.  Eat bland, easy to digest foods in small amounts as you are able, such as: ? Bananas. ? Applesauce. ? Rice. ? Lean meats. ? Toast. ? Crackers.  Avoid drinking fluids that contain a lot of sugar or caffeine.  Avoid alcohol.  Avoid spicy or fatty foods.  General instructions  Drink enough fluid to keep your pee (urine) clear or pale yellow.  Wash your hands often. If you cannot use soap and water, use hand sanitizer.  Make sure that all people in your household wash their hands well and often.  Rest at home while you get better.  Take over-the-counter  and prescription medicines only as told by your doctor.  Breathe slowly and deeply when you feel sick to your stomach.  Watch your condition for any changes.  Keep all follow-up visits as told by your doctor. This is important. Contact a doctor if:  You have a headache.  You have new symptoms.  You feel sicker to your stomach.  You have a fever.  You feel light-headed or dizzy.  You throw up.  You are not able to keep fluids down. Get help right away if:  You have pain in your chest, neck, arm, or jaw.  You feel very weak or you pass out (faint).  You have throw up that is bright red or looks like coffee grounds.  You have bloody or black poop (stools), or poop that looks like tar.  You have a very bad headache, a stiff neck, or both.  You have very bad pain, cramping, or bloating in your belly.  You have a rash.  You have trouble breathing or you are breathing very quickly.  Your heart is beating very quickly.  Your skin feels cold and clammy.  You feel confused.  You have pain while peeing.  You have signs of dehydration, such as: ? Dark pee, or very little or no pee. ? Cracked lips. ? Dry mouth. ? Sunken eyes. ? Sleepiness. ? Weakness. These symptoms may be an emergency. Do not wait to see if the symptoms will  go away. Get medical help right away. Call your local emergency services (911 in the [place] Do not drive yourself to the hospital. This information is not intended to replace advice given to you by your health care provider. Make sure you discuss any questions you have with your health care provider. Document Released: 07/15/2011 Document Revised: 01/01/2016 Document Reviewed: 04/01/2015 Elsevier Interactive Patient Education  [employer].

## 2017-08-04 LAB — COMPLETE METABOLIC PANEL WITH GFR
AG Ratio: 2.1 (calc) (ref 1.0–2.5)
ALT: 64 U/L — ABNORMAL HIGH (ref 6–29)
AST: 51 U/L — AB (ref 10–35)
Albumin: 4.9 g/dL (ref 3.6–5.1)
Alkaline phosphatase (APISO): 71 U/L (ref 33–130)
BUN: 12 mg/dL (ref 7–25)
CALCIUM: 10.3 mg/dL (ref 8.6–10.4)
CO2: 31 mmol/L (ref 20–32)
CREATININE: 0.87 mg/dL (ref 0.50–0.99)
Chloride: 98 mmol/L (ref 98–110)
GFR, EST NON AFRICAN AMERICAN: 71 mL/min/{1.73_m2} (ref >=60)
GFR, Est African American: 83 mL/min/{1.73_m2} (ref >=60)
GLOBULIN: 2.3 g/dL (ref 1.9–3.7)
Glucose, Bld: 113 mg/dL — ABNORMAL HIGH (ref 65–99)
Potassium: 3.9 mmol/L (ref 3.5–5.3)
SODIUM: 140 mmol/L (ref 135–146)
Total Bilirubin: 0.4 mg/dL (ref 0.2–1.2)
Total Protein: 7.2 g/dL (ref 6.1–8.1)

## 2017-08-04 LAB — CBC WITH DIFFERENTIAL/PLATELET
BASOS PCT: 0.6 %
Basophils Absolute: 47 cells/uL (ref 0–200)
EOS ABS: 172 {cells}/uL (ref 15–500)
Eosinophils Relative: 2.2 %
HCT: 37 % (ref 35.0–45.0)
HEMOGLOBIN: 12.8 g/dL (ref 11.7–15.5)
Lymphs Abs: 1318 cells/uL (ref 850–3900)
MCH: 30.5 pg (ref 27.0–33.0)
MCHC: 34.6 g/dL (ref 32.0–36.0)
MCV: 88.3 fL (ref 80.0–100.0)
MONOS PCT: 7.6 %
MPV: 9.4 fL (ref 7.5–12.5)
NEUTROS ABS: 5671 {cells}/uL (ref 1500–7800)
Neutrophils Relative %: 72.7 %
Platelets: 262 10*3/uL (ref 140–400)
RBC: 4.19 10*6/uL (ref 3.80–5.10)
RDW: 14.4 % (ref 11.0–15.0)
Total Lymphocyte: 16.9 %
WBC: 7.8 10*3/uL (ref 3.8–10.8)
WBCMIX: 593 {cells}/uL (ref 200–950)

## 2017-08-04 LAB — H. PYLORI BREATH TEST: H. PYLORI BREATH TEST: NOT DETECTED

## 2017-08-04 LAB — LIPASE: Lipase: 27 U/L (ref 7–60)

## 2017-08-05 ENCOUNTER — Other Ambulatory Visit: Payer: Self-pay

## 2017-08-05 ENCOUNTER — Telehealth: Payer: Self-pay

## 2017-08-05 NOTE — Telephone Encounter (Signed)
-----   Message from Lucilla Lame, MD sent at 08/05/2017  6:29 AM EST ----- This patient was supposed to have a gallbladder emptying study and I cannot see that she had it.  The patient's liver enzymes have gone up and she has nausea.  Continue please set her up for an office visit.

## 2017-08-05 NOTE — Telephone Encounter (Signed)
Contacted pt and scheduled the RUQ abdominal US and HIDA scan.   Pt scheduled for Wednesday, Jan 16th at 8:00am. Pt has been to advised to arrive at 7:45am and check in to the [facility] [employer]. She has also been advised to be NPO after midnight.   Pt has a scheduled follow up on 08/29/17. If anything abnormal, appt will get moved up sooner.

## 2017-08-08 ENCOUNTER — Telehealth: Payer: Self-pay | Admitting: Family Medicine

## 2017-08-08 ENCOUNTER — Other Ambulatory Visit: Payer: Self-pay | Admitting: Family Medicine

## 2017-08-08 DIAGNOSIS — I10 Essential (primary) hypertension: Secondary | ICD-10-CM

## 2017-08-08 NOTE — Telephone Encounter (Signed)
Refill request for Hypertension medication:  HCTZ 25 mg   Last office visit pertaining to hypertension: 06/21/2017  Follow up Visit:  08/22/2017   BP Readings from Last 3 Encounters:  08/03/17 122/68  07/13/17 130/74  07/04/17 126/84     Lab Results  Component Value Date   CREATININE 0.87 08/03/2017   BUN 12 08/03/2017   NA 140 08/03/2017   K 3.9 08/03/2017   CL 98 08/03/2017   CO2 31 08/03/2017

## 2017-08-08 NOTE — Telephone Encounter (Signed)
Done. Patient already has appointment.

## 2017-08-08 NOTE — Telephone Encounter (Addendum)
Please call patient to find out if she was able to schedule with Dr. Allen Norris.  If not, please obtain her scheduling preferences and call his office to schedule for her.  Thank you.

## 2017-08-09 DIAGNOSIS — R7303 Prediabetes: Secondary | ICD-10-CM

## 2017-08-09 HISTORY — DX: Prediabetes: R73.03

## 2017-08-11 ENCOUNTER — Telehealth: Payer: Self-pay | Admitting: Internal Medicine

## 2017-08-11 NOTE — Telephone Encounter (Signed)
Patient no longer needs appt with [facility] seeing another provider at [facility]   Deleting recall

## 2017-08-15 ENCOUNTER — Ambulatory Visit: Payer: Medicaid Other

## 2017-08-22 ENCOUNTER — Encounter: Payer: Self-pay | Admitting: Urology

## 2017-08-22 ENCOUNTER — Ambulatory Visit (INDEPENDENT_AMBULATORY_CARE_PROVIDER_SITE_OTHER): Payer: Medicaid Other | Admitting: Urology

## 2017-08-22 ENCOUNTER — Ambulatory Visit: Payer: Medicaid Other | Admitting: Family Medicine

## 2017-08-22 ENCOUNTER — Encounter: Payer: Self-pay | Admitting: Family Medicine

## 2017-08-22 VITALS — BP 110/70 | HR 96 | Ht 64.0 in | Wt 180.3 lb

## 2017-08-22 VITALS — BP 126/84 | HR 91 | Ht 64.0 in | Wt 181.0 lb

## 2017-08-22 DIAGNOSIS — I10 Essential (primary) hypertension: Secondary | ICD-10-CM

## 2017-08-22 DIAGNOSIS — R945 Abnormal results of liver function studies: Secondary | ICD-10-CM

## 2017-08-22 DIAGNOSIS — M542 Cervicalgia: Secondary | ICD-10-CM

## 2017-08-22 DIAGNOSIS — IMO0002 Reserved for concepts with insufficient information to code with codable children: Secondary | ICD-10-CM

## 2017-08-22 DIAGNOSIS — I479 Paroxysmal tachycardia, unspecified: Secondary | ICD-10-CM | POA: Diagnosis not present

## 2017-08-22 DIAGNOSIS — E8881 Metabolic syndrome: Secondary | ICD-10-CM | POA: Diagnosis not present

## 2017-08-22 DIAGNOSIS — J432 Centrilobular emphysema: Secondary | ICD-10-CM | POA: Diagnosis not present

## 2017-08-22 DIAGNOSIS — F339 Major depressive disorder, recurrent, unspecified: Secondary | ICD-10-CM | POA: Diagnosis not present

## 2017-08-22 DIAGNOSIS — H5713 Ocular pain, bilateral: Secondary | ICD-10-CM

## 2017-08-22 DIAGNOSIS — D692 Other nonthrombocytopenic purpura: Secondary | ICD-10-CM | POA: Diagnosis not present

## 2017-08-22 DIAGNOSIS — N3946 Mixed incontinence: Secondary | ICD-10-CM | POA: Diagnosis not present

## 2017-08-22 DIAGNOSIS — J9611 Chronic respiratory failure with hypoxia: Secondary | ICD-10-CM | POA: Diagnosis not present

## 2017-08-22 DIAGNOSIS — C799 Secondary malignant neoplasm of unspecified site: Secondary | ICD-10-CM

## 2017-08-22 DIAGNOSIS — R7989 Other specified abnormal findings of blood chemistry: Secondary | ICD-10-CM

## 2017-08-22 DIAGNOSIS — Z9981 Dependence on supplemental oxygen: Secondary | ICD-10-CM

## 2017-08-22 DIAGNOSIS — F411 Generalized anxiety disorder: Secondary | ICD-10-CM | POA: Diagnosis not present

## 2017-08-22 DIAGNOSIS — C210 Malignant neoplasm of anus, unspecified: Secondary | ICD-10-CM | POA: Diagnosis not present

## 2017-08-22 DIAGNOSIS — H538 Other visual disturbances: Secondary | ICD-10-CM

## 2017-08-22 DIAGNOSIS — E785 Hyperlipidemia, unspecified: Secondary | ICD-10-CM

## 2017-08-22 DIAGNOSIS — G8929 Other chronic pain: Secondary | ICD-10-CM

## 2017-08-22 MED ORDER — OXYBUTYNIN CHLORIDE ER 10 MG PO TB24: 10.0000 mg | ORAL_TABLET | Freq: Every day | ORAL | 11 refills | Status: DC

## 2017-08-22 MED ORDER — GABAPENTIN 300 MG PO CAPS: 300.0000 mg | ORAL_CAPSULE | Freq: Two times a day (BID) | ORAL | 1 refills | Status: DC

## 2017-08-22 MED ORDER — TOLTERODINE TARTRATE ER 4 MG PO CP24: 4.0000 mg | ORAL_CAPSULE | Freq: Every day | ORAL | 11 refills | Status: DC

## 2017-08-22 MED ORDER — HYDROCHLOROTHIAZIDE 25 MG PO TABS: 25.0000 mg | ORAL_TABLET | Freq: Every day | ORAL | 5 refills | Status: DC

## 2017-08-22 NOTE — Progress Notes (Signed)
08/22/2017 10:58 AM   Judy Allen 11-10-1955 [id number]  Referring provider: Steele Sizer, MD [address] [place], [place] [address]  Chief Complaint  Patient presents with  . Urinary Incontinence    1 month    HPI: I was consulted to assess the patient's urinary incontinence worsening over a few months. In the last year or so she had anal cancer with chemotherapy and radiation but no surgery. She can leak without awareness but also describes urgency and trying to undress prior to urinating. She denies stress incontinence. She denied bedwetting but then noted a foul-smelling discharge from the urethra she thinks. She tends towards constipation. She may have a vaginal discharge that is clear. She wears 2 pads a day sometimes damp sometimes moderately wet  She has had 2 neck surgery and one lower back operation. She has not had a hysterectomy  GU:On pelvic examination the patient had moderately severe vaginal atrophy. I saw no vaginitis discharge blood or urine in the vaginal vault. She had a grade 1 or small grade 2 cystocele. She had elevated tissue underneath the urethra that was nontender and she probably does not have a urethral diverticulum but she could. She had a small distal rectocele. The anus looked within normal limits  The patient has urinary incontinence not associated with awareness. She denies stress incontinence but I believe she also has urgency incontinence. She has had pelvic radiation. Her degree of leakage and pelvic examis not in keeping with the fistula. The patient did not have blood in the urine but I sent the urine for culture. I like to see the patient on Myrbetriq 25 mg in about 1 month for cystoscopy. We will proceed accordingly. I may or may not order urodynamics immediately.   Last visit The patient was 80% improved on Myrbetriq but Medicaid will not cover it.   Cystoscopy: Utilizing sterile technique patient  underwent flexible cystoscopy.  The bladder mucosa and trigone were normal.  There was no radiation cystitis.  There is no carcinoma.  There was no fistula.  She tolerated the procedure well  Today Because of cost issues I gave the patient Toviaz 4 mg last time The patient's incontinence is almost back to baseline.  Lisbeth Ply did not work.  Once again she said she was doing great on Myrbetriq but she says she does not think Medicaid covers any of it.   PMH: Past Medical History:  Diagnosis Date  . Anemia    H/O  . Anxiety   . Arthritis   . Colon cancer ([place]) 2017   anal ca/chemo and rad  . Complication of anesthesia   . Constipation   . Dysrhythmia    TACHYCARDIA-WELL CONTROLLED ON METOPROLO  . GERD (gastroesophageal reflux disease)   . H/O allergic rhinitis   . Heart murmur   . Hemorrhoid   . Hyperlipidemia   . Hypertension   . Neck pain, chronic   . Neuritis   . Ovarian failure   . Personal history of chemotherapy   . Personal history of radiation therapy   . PONV (postoperative nausea and vomiting)   . Proteinuria   . Psoriasis   . Sleep apnea 09/12/2016   8cm H2O with 21pm oxygen mask: Eson size Medium. Heated humidifier for nasal dryness.  . Tachycardia, paroxysmal ([place])   . Urinary incontinence     Surgical History: Past Surgical History:  Procedure Laterality Date  . ANTERIOR FUSION LUMBAR SPINE    . CARDIAC CATHETERIZATION  2009?    [facility]  . CARDIAC CATHETERIZATION  05/2012   [facility]: Mild hypertension 37/12 with a mean pressure of 21 mm mercury  . COLONOSCOPY  2008  . herniated disc repair    . MASS EXCISION Left 02/23/2016   Procedure: EXCISION MASS;  Surgeon: Christene Lye, MD;  Location: [facility] ORS;  Service: General;  Laterality: Left;  . NECK SURGERY    . PORTACATH PLACEMENT Left 03/12/2016   Procedure: INSERTION PORT-A-CATH;  Surgeon: Christene Lye, MD;  Location: [facility] ORS;  Service: General;  Laterality: Left;  . tonsillectomy  1959    history  . TUBAL LIGATION     s/p    Home Medications:  Allergies as of 08/22/2017      Reactions   Dapsone Other (See Comments)   Hypoxemia   Sulfa Antibiotics Rash      Medication List        Accurate as of 08/22/17 10:58 AM. Always use your most recent med list.          alclomethasone 0.05 % cream Commonly known as:  ACLOVATE 1(ONE) APPLICATION(S) TOPICAL EVERY DAY   clobetasol cream 0.05 % Commonly known as:  TEMOVATE Apply topically 2 (two) times daily.   [employer] B-12 1000 MCG Tbcr Generic drug:  Cyanocobalamin TAKE 1 TABLET (1,000 MCG TOTAL) BY MOUTH DAILY.   divalproex 250 MG DR tablet Commonly known as:  DEPAKOTE Take 1 tablet (250 mg total) by mouth at bedtime.   fesoterodine 4 MG Tb24 tablet Commonly known as:  TOVIAZ Take 1 tablet (4 mg total) by mouth daily.   fluticasone 50 MCG/ACT nasal spray Commonly known as:  FLONASE Place 2 sprays into both nostrils daily.   gabapentin 300 MG capsule Commonly known as:  NEURONTIN Take 1 capsule (300 mg total) by mouth 2 (two) times daily.   hydrochlorothiazide 25 MG tablet Commonly known as:  HYDRODIURIL TAKE 1 TABLET BY MOUTH EVERY DAY   HYDROcodone-acetaminophen 5-325 MG tablet Commonly known as:  NORCO/VICODIN Take 1 tablet by mouth 2 (two) times daily.   lansoprazole 30 MG capsule Commonly known as:  PREVACID Take 1 capsule (30 mg total) by mouth daily at 12 noon.   metoprolol succinate 50 MG 24 hr tablet Commonly known as:  TOPROL-XL Take 1 tablet (50 mg total) by mouth daily.   mirabegron ER 25 MG Tb24 tablet Commonly known as:  MYRBETRIQ Take 1 tablet (25 mg total) by mouth daily.   omega-3 acid ethyl esters 1 g capsule Commonly known as:  LOVAZA Take 2 capsules (2 g total) 2 (two) times daily by mouth.   OXYGEN Inhale 2 L daily into the lungs.   SYMBICORT 160-4.5 MCG/ACT inhaler Generic drug:  budesonide-formoterol Inhale 2 puffs 2 (two) times daily into the lungs.   tiotropium 18  MCG inhalation capsule Commonly known as:  [place] into inhaler and inhale.   tiZANidine 4 MG tablet Commonly known as:  ZANAFLEX TAKE 1 TABLET (4 MG TOTAL) BY MOUTH 3 (THREE) TIMES DAILY.   venlafaxine XR 150 MG 24 hr capsule Commonly known as:  EFFEXOR-XR Take 1 capsule (150 mg total) by mouth daily with breakfast.       Allergies:  Allergies  Allergen Reactions  . Dapsone Other (See Comments)    Hypoxemia  . Sulfa Antibiotics Rash    Family History: Family History  Problem Relation Age of Onset  . Heart attack Mother   . Breast cancer Maternal Aunt   .  Bladder Cancer Neg Hx   . Kidney cancer Neg Hx     Social History:  reports that she quit smoking about 11 years ago. Her smoking use included cigarettes. She started smoking about 54 years ago. She has a 32.00 pack-year smoking history. she has never used smokeless tobacco. She reports that she does not drink alcohol or use drugs.  ROS: UROLOGY Frequent Urination?: No Hard to postpone urination?: No Burning/pain with urination?: No Get up at night to urinate?: Yes Leakage of urine?: Yes Urine stream starts and stops?: No Trouble starting stream?: No Do you have to strain to urinate?: No Blood in urine?: No Urinary tract infection?: No Sexually transmitted disease?: No Injury to kidneys or bladder?: No Painful intercourse?: No Weak stream?: No Currently pregnant?: No Vaginal bleeding?: No Last menstrual period?: n  Gastrointestinal Nausea?: No Vomiting?: No Indigestion/heartburn?: No Diarrhea?: No Constipation?: No  Constitutional Fever: No Night sweats?: No Weight loss?: No Fatigue?: No  Skin Skin rash/lesions?: No Itching?: No  Eyes Blurred vision?: Yes Double vision?: No  Ears/Nose/Throat Sore throat?: No Sinus problems?: No  Hematologic/Lymphatic Swollen glands?: No Easy bruising?: No  Cardiovascular Leg swelling?: No Chest pain?: No  Respiratory Cough?: No Shortness  of breath?: No  Endocrine Excessive thirst?: No  Musculoskeletal Back pain?: Yes Joint pain?: No  Neurological Headaches?: No Dizziness?: Yes  Psychologic Depression?: No Anxiety?: No  Physical Exam: BP 126/84   Pulse 91   Ht 5\' 4"  (1.626 m)   Wt 181 lb (82.1 kg)   LMP 03/04/2002 (Approximate)   BMI 31.07 kg/m   Constitutional:  Alert and oriented, No acute distress.  Lab Results  Component Value Date   WBC 7.8 08/03/2017   HGB 12.8 08/03/2017   HCT 37.0 08/03/2017   MCV 88.3 08/03/2017   PLT 262 08/03/2017    Lab Results  Component Value Date   CREATININE 0.87 08/03/2017    No results found for: PSA  No results found for: TESTOSTERONE  Lab Results  Component Value Date   HGBA1C 5.7 (H) 03/03/2017    Urinalysis    Component Value Date/Time   COLORURINE STRAW (A) 11/02/2015 1227   APPEARANCEUR Clear 07/04/2017 1047   LABSPEC 1.008 11/02/2015 1227   LABSPEC 1.005 06/08/2012 1117   PHURINE 7.0 11/02/2015 1227   GLUCOSEU Negative 07/04/2017 1047   GLUCOSEU Negative 06/08/2012 1117   HGBUR NEGATIVE 11/02/2015 1227   BILIRUBINUR Negative 07/04/2017 1047   BILIRUBINUR Negative 06/08/2012 1117   KETONESUR NEGATIVE 11/02/2015 1227   PROTEINUR Negative 07/04/2017 1047   PROTEINUR NEGATIVE 11/02/2015 1227   NITRITE Negative 07/04/2017 1047   NITRITE NEGATIVE 11/02/2015 1227   LEUKOCYTESUR Trace 07/04/2017 1047   LEUKOCYTESUR Negative 06/08/2012 1117    Pertinent Imaging: None  Assessment & Plan: I gave her 2 prescriptions one for Detrol LA 4 mg and oxybutynin ER 10 mg and reassess in 8 weeks.  I might order urodynamics.  She is on home oxygen.  I do not think InterStim with her COPD may not be a good option.  With her radiation I do not think Botox is a very good option.  Percutaneous tibial nerve stimulation is an option.  There are no diagnoses linked to this encounter.  No Follow-up on file.  Reece Packer, MD  [facility] [address] [place], [place] [address] [phone]

## 2017-08-22 NOTE — Progress Notes (Signed)
Name: Judy Allen   MRN: [id number]    DOB: [birth year]   Date:08/22/2017       Progress Note  Subjective  Chief Complaint  Chief Complaint  Patient presents with  . Follow-up    2 month F/U  . Hypertension  . Anxiety  . Depression    HPI   HTN:she is now back on HCTZ 12.5 mg and Toprol XL she is doing well at this time, bp is at goal, no chest pain or palpitation  RUQ pain: intermittent RUQ pain, not sure of triggering factors, but PET scan done in April showed gallstones, but further testing not approved by insurance. No nausea or vomi ting, last triglycerides was very high, having repeat gallbladder US and has follow up with Dr Durwin Reges  Neck pain: neck pain is still present still having constant  radiculitis left arm again, she was sent back to [facility] 2017. She had a steroid injection injection for her facet arthropathy but there was no improvement of the pain. She was advised to try otc nsaid's and continue hydrocodone. Pain is described as sharp and throbbing pain, sometimes burning, currently a 3/10. She states hydrocodone is not helping as much as it used to and is going back to see another clinic, spine Center in [facility] for second opening   Chronic low back pain: she was seen by [facility], history of lumbar spine surgery and had injections by Dr. Mauri Pole  in the past with improvement of symptoms, no radiculitis, worse when bending forward. Taking hydrocodone and also muscle relaxer and last drug screen was done 05/2016 , [place] database checked 03/02/2017 and I am the only rx of controlled medications, she is going to see [place] in [facility] who will rx long term pain management from now on  GAD/Depression Major: currently on Effexor and Depakote because Abilify and Cymbalta was too expenisve- no longer could afford. She denies suicidal thoughts or ideation. She has anhedonia, helping her daughter again, no longer has daily  crying spells. She was referred to psychiatrist but cancelled appointment because she was feeling better, not interested in counseling at this time.   Anal Squamous cell Carcinoma: seeing oncologist and radiation oncologist at [facility]. She has follow up at [facility] every 3 months for 2 years, either with oncologist or radiation oncologist, complication from radiation therapy was incontinence, seeing urologist and since Myrbetriq not covered by insurance she was given two rx by urologist to try out   Emphysema: she used to smoke for many years, but quit 10 years ago, but was admitted to [facility] in October 2017 , had CT chest to rule out PE and was found to have centrilobular emphysema and also hypoxemia, she was sent home on 2-3 liters of oxygen since  She goes to [facility] now, no longer seeing Dr. Mortimer Fries, she uses Symbicort and spiriva daily   OSA ;diagnosed at [facility], on 8 cm H2 O pressure, but not compliant with CPAP , she states it was bothering her sleep and pulse ox was still low during the night. She states she also has RLS and is already on gabapentin   Dysmetabolic syndrome: avoiding sweets, losing a little weight, no polyphagia, polydipsia but has polyuria  Dyslipidemia: only on Lovaza now, last liver enzymes slightly high we will recheck labs and consider adding statin therapy   Eye pain and blurred vision: she noticed eye pain, dull ache, no history of trauma and also blurred vision at  night,    Patient Active Problem List   Diagnosis Date Noted  . Gallstones 12/22/2016  . Fatty liver 12/22/2016  . Chronic respiratory failure with hypoxia, on home oxygen therapy ([place]) 07/28/2016  . Centrilobular emphysema ([place]) 07/14/2016  . Abnormal Pap smear of cervix 04/14/2016  . Anal squamous cell carcinoma ([place]) 03/09/2016  . Metastatic squamous cell carcinoma ([place]) 02/25/2016  . Non-thrombocytopenic purpura ([place]) 01/12/2016  . Chronic constipation 10/16/2015  . History of fusion of cervical spine 04/02/2015   . Benign hypertension 01/16/2015  . Chronic cervical pain 01/16/2015  . CN (constipation) 01/16/2015  . Gastric reflux 01/16/2015  . Hypertriglyceridemia 01/16/2015  . Edema leg 01/16/2015  . Chronic recurrent major depressive disorder ([place]) 01/16/2015  . Dysmetabolic syndrome 24/58/0998  . Obstructive apnea 01/16/2015  . Psoriasis 01/16/2015  . Allergic rhinitis 01/16/2015  . Bursitis, trochanteric 01/16/2015  . GAD (generalized anxiety disorder) 01/16/2015  . Tachycardia, paroxysmal [facility])     Past Surgical History:  Procedure Laterality Date  . ANTERIOR FUSION LUMBAR SPINE    . CARDIAC CATHETERIZATION  2009?    [facility]  . CARDIAC CATHETERIZATION  05/2012   [facility]: Mild hypertension 37/12 with a mean pressure of 21 mm mercury  . COLONOSCOPY  2008  . herniated disc repair    . MASS EXCISION Left 02/23/2016   Procedure: EXCISION MASS;  Surgeon: Christene Lye, MD;  Location: [facility] ORS;  Service: General;  Laterality: Left;  . NECK SURGERY    . PORTACATH PLACEMENT Left 03/12/2016   Procedure: INSERTION PORT-A-CATH;  Surgeon: Christene Lye, MD;  Location: [facility] ORS;  Service: General;  Laterality: Left;  . tonsillectomy  1959   history  . TUBAL LIGATION     s/p    Family History  Problem Relation Age of Onset  . Heart attack Mother   . Breast cancer Maternal Aunt   . Bladder Cancer Neg Hx   . Kidney cancer Neg Hx     Social History   Socioeconomic History  . Marital status: Married    Spouse name: Not on file  . Number of children: Not on file  . Years of education: Not on file  . Highest education level: Not on file  Social Needs  . Financial resource strain: Not on file  . Food insecurity - worry: Not on file  . Food insecurity - inability: Not on file  . Transportation needs - medical: Not on file  . Transportation needs - non-medical: Not on file  Occupational History  . Not on file  Tobacco Use  . Smoking status: Former Smoker    Packs/day:  1.00    Years: 32.00    Pack years: 32.00    Types: Cigarettes    Start date: 08/10/1963    Last attempt to quit: 01/15/2006    Years since quitting: 11.6  . Smokeless tobacco: Never Used  Substance and Sexual Activity  . Alcohol use: No    Alcohol/week: 0.0 oz  . Drug use: No  . Sexual activity: No    Birth control/protection: Post-menopausal  Other Topics Concern  . Not on file  Social History Narrative  . Not on file     Current Outpatient Medications:  .  alclomethasone (ACLOVATE) 0.05 % cream, 1(ONE) APPLICATION(S) TOPICAL EVERY DAY, Disp: , Rfl: 1 .  budesonide-formoterol (SYMBICORT) 160-4.5 MCG/ACT inhaler, Inhale 2 puffs 2 (two) times daily into the lungs., Disp: , Rfl:  .  clobetasol cream (TEMOVATE) 0.05 %, Apply topically  2 (two) times daily., Disp: 60 g, Rfl: 2 .  [employer] B-12 1000 MCG TBCR, TAKE 1 TABLET (1,000 MCG TOTAL) BY MOUTH DAILY., Disp: , Rfl: 11 .  divalproex (DEPAKOTE) 250 MG DR tablet, Take 1 tablet (250 mg total) by mouth at bedtime., Disp: 30 tablet, Rfl: 5 .  fesoterodine (TOVIAZ) 4 MG TB24 tablet, Take 1 tablet (4 mg total) by mouth daily., Disp: 30 tablet, Rfl: 11 .  fluticasone (FLONASE) 50 MCG/ACT nasal spray, Place 2 sprays into both nostrils daily., Disp: 16 g, Rfl: 6 .  gabapentin (NEURONTIN) 300 MG capsule, Take 1 capsule (300 mg total) by mouth 2 (two) times daily., Disp: 180 capsule, Rfl: 1 .  hydrochlorothiazide (HYDRODIURIL) 25 MG tablet, Take 1 tablet (25 mg total) by mouth daily., Disp: 30 tablet, Rfl: 5 .  HYDROcodone-acetaminophen (NORCO/VICODIN) 5-325 MG tablet, Take 1 tablet by mouth 2 (two) times daily., Disp: 60 tablet, Rfl: 0 .  lansoprazole (PREVACID) 30 MG capsule, Take 1 capsule (30 mg total) by mouth daily at 12 noon., Disp: 90 capsule, Rfl: 1 .  metoprolol succinate (TOPROL-XL) 50 MG 24 hr tablet, Take 1 tablet (50 mg total) by mouth daily., Disp: 30 tablet, Rfl: 5 .  mirabegron ER (MYRBETRIQ) 25 MG TB24 tablet, Take 1 tablet (25 mg  total) by mouth daily., Disp: 30 tablet, Rfl: 11 .  omega-3 acid ethyl esters (LOVAZA) 1 g capsule, Take 2 capsules (2 g total) 2 (two) times daily by mouth., Disp: 120 capsule, Rfl: 5 .  oxybutynin (DITROPAN-XL) 10 MG 24 hr tablet, Take 1 tablet (10 mg total) by mouth daily., Disp: 30 tablet, Rfl: 11 .  OXYGEN, Inhale 2 L daily into the lungs., Disp: , Rfl:  .  tiotropium (SPIRIVA) 18 MCG inhalation capsule, Place into inhaler and inhale., Disp: , Rfl:  .  tiZANidine (ZANAFLEX) 4 MG tablet, TAKE 1 TABLET (4 MG TOTAL) BY MOUTH 3 (THREE) TIMES DAILY., Disp: 90 tablet, Rfl: 2 .  tolterodine (DETROL LA) 4 MG 24 hr capsule, Take 1 capsule (4 mg total) by mouth daily., Disp: 30 capsule, Rfl: 11 .  venlafaxine XR (EFFEXOR-XR) 150 MG 24 hr capsule, Take 1 capsule (150 mg total) by mouth daily with breakfast., Disp: 30 capsule, Rfl: 5  Allergies  Allergen Reactions  . Dapsone Other (See Comments)    Hypoxemia  . Sulfa Antibiotics Rash     ROS  Constitutional: Negative for fever no significant  weight change.  Respiratory: Negative for cough , positive for shortness of breath.   Cardiovascular: Negative for chest pain or palpitations.  Gastrointestinal: Negative for abdominal pain, no bowel changes.  Musculoskeletal: Negative for gait problem or joint swelling.  Skin: wound left hand ( from dog ) healing Neurological: Negative for dizziness or headache.  No other specific complaints in a complete review of systems (except as listed in HPI above).  Objective  Vitals:   08/22/17 1318  BP: 110/70  Pulse: 96  SpO2: 97%  Weight: 180 lb 4.8 oz (81.8 kg)  Height: 5\' 4"  (1.626 m)    Body mass index is 30.95 kg/m.  Physical Exam  Constitutional: Patient appears well-developed and well-nourished. Obese  No distress.  HEENT: head atraumatic, normocephalic, pupils equal and reactive to light, neck supple, throat within normal limits Cardiovascular: Normal rate, regular rhythm and normal  heart sounds.  No murmur heard. No BLE edema. Pulmonary/Chest: Effort normal and breath sounds normal. No respiratory distress. Abdominal: Soft.  There is no tenderness. Psychiatric: Patient  has a normal mood and affect. behavior is normal. Judgment and thought content normal. Skin: scar from recent trauma left hand, purpura on left arm  Muscular Skeletal: very mild pain during palpation of lumbar spine, negative straight leg raise  Recent Results (from the past 2160 hour(s))  Urinalysis, Complete     Status: Abnormal   Collection Time: 06/06/17  1:52 PM  Result Value Ref Range   Specific Gravity, UA <1.005 (L) 1.005 - 1.030   pH, UA 6.0 5.0 - 7.5   Color, UA Yellow Yellow   Appearance Ur Clear Clear   Leukocytes, UA Trace (A) Negative   Protein, UA Negative Negative/Trace   Glucose, UA Negative Negative   Ketones, UA Negative Negative   RBC, UA Negative Negative   Bilirubin, UA Negative Negative   Urobilinogen, Ur 0.2 0.2 - 1.0 mg/dL   Nitrite, UA Negative Negative  TSH     Status: None   Collection Time: 06/06/17  3:39 PM  Result Value Ref Range   TSH 1.33 0.40 - 4.50 mIU/L  CBC     Status: None   Collection Time: 06/06/17  3:39 PM  Result Value Ref Range   WBC 5.8 3.8 - 10.8 Thousand/uL   RBC 4.24 3.80 - 5.10 Million/uL   Hemoglobin 12.7 11.7 - 15.5 g/dL   HCT 37.0 35.0 - 45.0 %   MCV 87.3 80.0 - 100.0 fL   MCH 30.0 27.0 - 33.0 pg   MCHC 34.3 32.0 - 36.0 g/dL   RDW 13.9 11.0 - 15.0 %   Platelets 280 140 - 400 Thousand/uL   MPV 9.4 7.5 - 12.5 fL  Lipid panel     Status: Abnormal   Collection Time: 06/06/17  3:39 PM  Result Value Ref Range   Cholesterol 237 (H) <200 mg/dL   HDL 39 (L) >50 mg/dL   Triglycerides 606 (H) <150 mg/dL   LDL Cholesterol (Calc)  mg/dL (calc)    Comment: . LDL cholesterol not calculated. Triglyceride levels greater than 400 mg/dL invalidate calculated LDL results. . Reference range: <100 . Desirable range <100 mg/dL for primary  prevention;   <70 mg/dL for patients with CHD or diabetic patients  with > or = 2 CHD risk factors. Marland Kitchen LDL-C is now calculated using the Martin-Hopkins  calculation, which is a validated novel method providing  better accuracy than the Friedewald equation in the  estimation of LDL-C.  [name]. 0017;494(49): 2061-2068  (http://education.[employer].com/faq/FAQ164)    Total CHOL/HDL Ratio 6.1 (H) <5.0 (calc)   Non-HDL Cholesterol (Calc) 198 (H) <130 mg/dL (calc)    Comment: For patients with diabetes plus 1 major ASCVD risk  factor, treating to a non-HDL-C goal of <100 mg/dL  (LDL-C of <70 mg/dL) is considered a therapeutic  option.   COMPLETE METABOLIC PANEL WITH GFR     Status: Abnormal   Collection Time: 06/06/17  3:39 PM  Result Value Ref Range   Glucose, Bld 89 65 - 99 mg/dL    Comment: .            Fasting reference interval .    BUN 9 7 - 25 mg/dL   Creat 1.05 (H) 0.50 - 0.99 mg/dL    Comment: For patients >52 years of age, the reference limit for Creatinine is approximately 13% higher for people identified as African-American. .    GFR, Est Non African American 57 (L) > OR = 60 mL/min/1.74m2   GFR, Est African American 66 >  OR = 60 mL/min/1.24m2   BUN/Creatinine Ratio 9 6 - 22 (calc)   Sodium 141 135 - 146 mmol/L   Potassium 4.1 3.5 - 5.3 mmol/L   Chloride 102 98 - 110 mmol/L   CO2 31 20 - 32 mmol/L   Calcium 10.0 8.6 - 10.4 mg/dL   Total Protein 7.4 6.1 - 8.1 g/dL   Albumin 4.9 3.6 - 5.1 g/dL   Globulin 2.5 1.9 - 3.7 g/dL (calc)   AG Ratio 2.0 1.0 - 2.5 (calc)   Total Bilirubin 0.4 0.2 - 1.2 mg/dL   Alkaline phosphatase (APISO) 63 33 - 130 U/L   AST 33 10 - 35 U/L   ALT 47 (H) 6 - 29 U/L  Urinalysis, Complete     Status: None   Collection Time: 07/04/17 10:47 AM  Result Value Ref Range   Specific Gravity, UA 1.010 1.005 - 1.030   pH, UA 6.0 5.0 - 7.5   Color, UA Yellow Yellow   Appearance Ur Clear Clear   Leukocytes, UA Trace Negative    Protein, UA Negative Negative/Trace   Glucose, UA Negative Negative   Ketones, UA Negative Negative   RBC, UA Negative Negative   Bilirubin, UA Negative Negative   Urobilinogen, Ur 0.2 0.2 - 1.0 mg/dL   Nitrite, UA Negative Negative   Microscopic Examination See below:   Microscopic Examination     Status: Abnormal   Collection Time: 07/04/17 10:47 AM  Result Value Ref Range   WBC, UA 6-10 (A) 0 - 5 /hpf   RBC, UA 0-2 0 - 2 /hpf   Epithelial Cells (non renal) 0-10 0 - 10 /hpf   Renal Epithel, UA 0-10 None seen /hpf   Bacteria, UA Few (A) None seen/Few  COMPLETE METABOLIC PANEL WITH GFR     Status: Abnormal   Collection Time: 08/03/17  3:04 PM  Result Value Ref Range   Glucose, Bld 113 (H) 65 - 99 mg/dL    Comment: .            Fasting reference interval . For someone without known diabetes, a glucose value between 100 and 125 mg/dL is consistent with prediabetes and should be confirmed with a follow-up test. .    BUN 12 7 - 25 mg/dL   Creat 0.87 0.50 - 0.99 mg/dL    Comment: For patients >57 years of age, the reference limit for Creatinine is approximately 13% higher for people identified as African-American. .    GFR, Est Non African American 71 > OR = 60 mL/min/1.24m2   GFR, Est African American 83 > OR = 60 mL/min/1.93m2   BUN/Creatinine Ratio NOT APPLICABLE 6 - 22 (calc)   Sodium 140 135 - 146 mmol/L   Potassium 3.9 3.5 - 5.3 mmol/L   Chloride 98 98 - 110 mmol/L   CO2 31 20 - 32 mmol/L   Calcium 10.3 8.6 - 10.4 mg/dL   Total Protein 7.2 6.1 - 8.1 g/dL   Albumin 4.9 3.6 - 5.1 g/dL   Globulin 2.3 1.9 - 3.7 g/dL (calc)   AG Ratio 2.1 1.0 - 2.5 (calc)   Total Bilirubin 0.4 0.2 - 1.2 mg/dL   Alkaline phosphatase (APISO) 71 33 - 130 U/L   AST 51 (H) 10 - 35 U/L   ALT 64 (H) 6 - 29 U/L  Lipase     Status: None   Collection Time: 08/03/17  3:04 PM  Result Value Ref Range   Lipase 27 7 - 60  U/L  CBC w/Diff/Platelet     Status: None   Collection Time: 08/03/17   3:04 PM  Result Value Ref Range   WBC 7.8 3.8 - 10.8 Thousand/uL   RBC 4.19 3.80 - 5.10 Million/uL   Hemoglobin 12.8 11.7 - 15.5 g/dL   HCT 37.0 35.0 - 45.0 %   MCV 88.3 80.0 - 100.0 fL   MCH 30.5 27.0 - 33.0 pg   MCHC 34.6 32.0 - 36.0 g/dL   RDW 14.4 11.0 - 15.0 %   Platelets 262 140 - 400 Thousand/uL   MPV 9.4 7.5 - 12.5 fL   Neutro Abs 5,671 1,500 - 7,800 cells/uL   Lymphs Abs 1,318 850 - 3,900 cells/uL   WBC mixed population 593 200 - 950 cells/uL   Eosinophils Absolute 172 15 - 500 cells/uL   Basophils Absolute 47 0 - 200 cells/uL   Neutrophils Relative % 72.7 %   Total Lymphocyte 16.9 %   Monocytes Relative 7.6 %   Eosinophils Relative 2.2 %   Basophils Relative 0.6 %  H. pylori breath test     Status: None   Collection Time: 08/03/17  3:04 PM  Result Value Ref Range   H. pylori Breath Test NOT DETECTED NOT DETECT    Comment: . Antimicrobials, proton pump inhibitors, and bismuth preparations are known to suppress H. pylori, and  ingestion of these prior to H. pylori diagnostic testing may lead to false negative results. If clinically  indicated, the test may be repeated on a new specimen obtained two weeks after discontinuing treatment. However, a positive result is still clinically valid.       PHQ2/9: Depression screen [facility] 2/9 08/03/2017 06/21/2017 12/22/2016 10/13/2016 09/24/2016  Decreased Interest 0 2 0 1 0  Down, Depressed, Hopeless 0 1 0 3 1  PHQ - 2 Score 0 3 0 4 1  Altered sleeping - 3 - 1 -  Tired, decreased energy - 0 - 3 -  Change in appetite - 1 - 1 -  Feeling bad or failure about yourself  - 0 - 1 -  Trouble concentrating - 0 - 0 -  Moving slowly or fidgety/restless - 0 - 0 -  Suicidal thoughts - 0 - 0 -  PHQ-9 Score - 7 - 10 -  Difficult doing work/chores - - - Somewhat difficult -  Some recent data might be hidden     Fall Risk: Fall Risk  08/22/2017 08/03/2017 06/21/2017 06/06/2017 12/22/2016  Falls in the past year? No No No No No  Number  falls in past yr: - - - - -  Injury with Fall? - - - - -  Follow up - - - - -     Functional Status Survey: Is the patient deaf or have difficulty hearing?: No Does the patient have difficulty seeing, even when wearing glasses/contacts?: No Does the patient have difficulty concentrating, remembering, or making decisions?: No Does the patient have difficulty walking or climbing stairs?: No Does the patient have difficulty dressing or bathing?: No Does the patient have difficulty doing errands alone such as visiting a doctor's office or shopping?: No    Assessment & Plan  1. Chronic recurrent major depressive disorder (HCC)  On Effexor and is doing well at this time  2. Tachycardia, paroxysmal (HCC)  H is normal at this time, no palpitation   3. Non-thrombocytopenic purpura ([place])  Still has bruising both arms   4. Metastatic squamous cell carcinoma ([place])  Still under care  of [facility]  5. Centrilobular emphysema (HCC)  Still oxygen dependent   6. Chronic respiratory failure with hypoxia, on home oxygen therapy (HCC)   7. Hypertension, benign  Well controlled - hydrochlorothiazide (HYDRODIURIL) 25 MG tablet; Take 1 tablet (25 mg total) by mouth daily.  Dispense: 30 tablet; Refill: 5  8. Dysmetabolic syndrome  Last FYBO1B was okay at 5.7%  9. GAD (generalized anxiety disorder)  Stable, off alprazolam, only taking Effexor  10. Chronic cervical pain  She has daily pain, she has a follow up with spine center tomorrow so they can take over treatment for neck pain, hydrocodone is not really managing her pain and I was filling rx that was originally given by Ortho surgeon , Dr. Mauri Pole ( who left town years ago)  - gabapentin (NEURONTIN) 300 MG capsule; Take 1 capsule (300 mg total) by mouth 2 (two) times daily.  Dispense: 180 capsule; Refill: 1  11. Oxygen dependent  [facility] pul[facility]. Anal cancer [facility])  Continue follow up with oncologist and radiation  oncologist at [facility]  13. Dyslipidemia  Needs to recheck labs   60. Elevated LFTs  - Hepatic function panel - Lipid panel  15. Eye pain, bilateral  - Ambulatory referral to Ophthalmology  16. Blurred vision  - Hemoglobin A1c - Ambulatory referral to Ophthalmology

## 2017-08-24 ENCOUNTER — Ambulatory Visit
Admission: RE | Admit: 2017-08-24 | Discharge: 2017-08-24 | Disposition: A | Discharge status: Home / Self Care | Payer: Medicaid Other | Source: Ambulatory Visit | Attending: Gastroenterology | Admitting: Gastroenterology

## 2017-08-24 DIAGNOSIS — K824 Cholesterolosis of gallbladder: Secondary | ICD-10-CM | POA: Insufficient documentation

## 2017-08-24 DIAGNOSIS — R1084 Generalized abdominal pain: Secondary | ICD-10-CM | POA: Diagnosis present

## 2017-08-24 MED ORDER — TECHNETIUM TC 99M MEBROFENIN IV KIT
5.2390 | PACK | Freq: Once | INTRAVENOUS | Status: AC | PRN
  Administered 2017-08-24: 5.239 via INTRAVENOUS

## 2017-08-25 ENCOUNTER — Encounter: Payer: Self-pay | Admitting: Family Medicine

## 2017-08-27 ENCOUNTER — Encounter: Payer: Self-pay | Admitting: Family Medicine

## 2017-08-29 ENCOUNTER — Other Ambulatory Visit: Payer: Self-pay

## 2017-08-29 ENCOUNTER — Telehealth: Payer: Self-pay

## 2017-08-29 ENCOUNTER — Ambulatory Visit: Payer: Medicaid Other | Admitting: Gastroenterology

## 2017-08-29 ENCOUNTER — Other Ambulatory Visit: Payer: Self-pay | Admitting: Family Medicine

## 2017-08-29 ENCOUNTER — Encounter: Payer: Self-pay | Admitting: Gastroenterology

## 2017-08-29 VITALS — BP 132/78 | HR 103 | Ht 64.0 in | Wt 182.0 lb

## 2017-08-29 DIAGNOSIS — R110 Nausea: Secondary | ICD-10-CM | POA: Diagnosis not present

## 2017-08-29 DIAGNOSIS — M542 Cervicalgia: Principal | ICD-10-CM

## 2017-08-29 DIAGNOSIS — R1011 Right upper quadrant pain: Secondary | ICD-10-CM

## 2017-08-29 DIAGNOSIS — K824 Cholesterolosis of gallbladder: Secondary | ICD-10-CM

## 2017-08-29 DIAGNOSIS — G8929 Other chronic pain: Secondary | ICD-10-CM

## 2017-08-29 NOTE — Progress Notes (Signed)
Primary Care Physician: Steele Sizer, MD  Primary Gastroenterologist:  Dr. Lucilla Lame  Chief Complaint  Patient presents with  . Abdominal Pain    HPI: Judy Allen is a 62 y.o. female here for follow-up after having a gallbladder empty study. The patient had a normal ejection fraction but had the similar symptoms she has been reporting when she took the oral challenge for gallbladder contraction. The patient was also found to have a polyp in the gallbladder that was 1.2 cm. The patient has not had any improvement in her symptoms recently.  Current Outpatient Medications  Medication Sig Dispense Refill  . alclomethasone (ACLOVATE) 0.05 % cream 1(ONE) APPLICATION(S) TOPICAL EVERY DAY  1  . budesonide-formoterol (SYMBICORT) 160-4.5 MCG/ACT inhaler Inhale 2 puffs 2 (two) times daily into the lungs.    . clobetasol cream (TEMOVATE) 0.05 % Apply topically 2 (two) times daily. 60 g 2  . CVS B-12 1000 MCG TBCR TAKE 1 TABLET (1,000 MCG TOTAL) BY MOUTH DAILY.  11  . divalproex (DEPAKOTE) 250 MG DR tablet Take 1 tablet (250 mg total) by mouth at bedtime. 30 tablet 5  . fesoterodine (TOVIAZ) 4 MG TB24 tablet Take 1 tablet (4 mg total) by mouth daily. 30 tablet 11  . fluticasone (FLONASE) 50 MCG/ACT nasal spray Place 2 sprays into both nostrils daily. 16 g 6  . gabapentin (NEURONTIN) 300 MG capsule Take 1 capsule (300 mg total) by mouth 2 (two) times daily. 180 capsule 1  . hydrochlorothiazide (HYDRODIURIL) 25 MG tablet Take 1 tablet (25 mg total) by mouth daily. 30 tablet 5  . HYDROcodone-acetaminophen (NORCO/VICODIN) 5-325 MG tablet Take 1 tablet by mouth 2 (two) times daily. 60 tablet 0  . metoprolol succinate (TOPROL-XL) 50 MG 24 hr tablet Take 1 tablet (50 mg total) by mouth daily. 30 tablet 5  . omega-3 acid ethyl esters (LOVAZA) 1 g capsule Take 2 capsules (2 g total) 2 (two) times daily by mouth. 120 capsule 5  . omeprazole (PRILOSEC) 40 MG capsule Take by mouth daily.  5  .  oxybutynin (DITROPAN-XL) 10 MG 24 hr tablet Take 1 tablet (10 mg total) by mouth daily. 30 tablet 11  . OXYGEN Inhale 2 L daily into the lungs.    . tiotropium (SPIRIVA) 18 MCG inhalation capsule Place into inhaler and inhale.    Marland Kitchen tiZANidine (ZANAFLEX) 4 MG tablet TAKE 1 TABLET (4 MG TOTAL) BY MOUTH 3 (THREE) TIMES DAILY. 90 tablet 2  . tolterodine (DETROL LA) 4 MG 24 hr capsule Take 1 capsule (4 mg total) by mouth daily. 30 capsule 11  . venlafaxine XR (EFFEXOR-XR) 150 MG 24 hr capsule Take 1 capsule (150 mg total) by mouth daily with breakfast. 30 capsule 5  . lansoprazole (PREVACID) 30 MG capsule Take 1 capsule (30 mg total) by mouth daily at 12 noon. (Patient not taking: Reported on 08/29/2017) 90 capsule 1   No current facility-administered medications for this visit.     Allergies as of 08/29/2017 - Review Complete 08/29/2017  Allergen Reaction Noted  . Dapsone Other (See Comments) 09/24/2016  . Sulfa antibiotics Rash 09/18/2012    ROS:  General: Negative for anorexia, weight loss, fever, chills, fatigue, weakness. ENT: Negative for hoarseness, difficulty swallowing , nasal congestion. CV: Negative for chest pain, angina, palpitations, dyspnea on exertion, peripheral edema.  Respiratory: Negative for dyspnea at rest, dyspnea on exertion, cough, sputum, wheezing.  GI: See history of present illness. GU:  Negative for dysuria, hematuria, urinary  incontinence, urinary frequency, nocturnal urination.  Endo: Negative for unusual weight change.    Physical Examination:   BP 132/78   Pulse (!) 103   Ht 5\' 4"  (1.626 m)   Wt 182 lb (82.6 kg)   LMP 03/04/2002 (Approximate)   BMI 31.24 kg/m   General: Well-nourished, well-developed in no acute distress.  Eyes: No icterus. Conjunctivae pink. Extremities: No lower extremity edema. No clubbing or deformities. Neuro: Alert and oriented x 3.  Grossly intact. Skin: Warm and dry, no jaundice.   Psych: Alert and cooperative, normal mood  and affect.  Labs:    Imaging Studies: Nm Hepato W/eject Fract  Result Date: 08/24/2017 CLINICAL DATA:  Chronic abdominal pain and nausea EXAM: NUCLEAR MEDICINE HEPATOBILIARY IMAGING WITH GALLBLADDER EF VIEWS: Anterior right upper quadrant RADIOPHARMACEUTICALS:  5.239 mCi Tc-34m  Choletec IV COMPARISON:  None. FINDINGS: Liver uptake of radiotracer is normal. There is prompt visualization of gallbladder and small bowel, indicating patency of the cystic and common bile ducts. The patient consumed 8 ounces of Ensure orally with calculation of the computer generated ejection fraction of radiotracer from the gallbladder. The patient experienced abdominal pain and nausea with the oral Ensure consumption. The computer generated ejection fraction of radiotracer from the gallbladder is normal at 42%, normal greater than 33% using the oral agent. IMPRESSION: Normal ejection fraction of radiotracer from the gallbladder. The patient did experience nausea and pain with the oral Ensure consumption. Cystic and common bile ducts are patent as is evidenced by visualization of gallbladder and small bowel. Electronically Signed   By: Lowella Grip III M.D.   On: 08/24/2017 13:58   US Abdomen Limited Ruq  Result Date: 08/24/2017 CLINICAL DATA:  Abdominal pain 1-2 years. EXAM: ULTRASOUND ABDOMEN LIMITED RIGHT UPPER QUADRANT COMPARISON:  10/22/2016 FINDINGS: Gallbladder: No evidence of cholelithiasis or wall thickening. Negative sonographic Murphy's sign. No pericholecystic fluid. Persistence of an echogenic focus along the gallbladder wall which is nonmobile and measures 1.2 cm (previously 7 mm). This likely represents slight increased size of a gallbladder polyp. Common bile duct: Diameter: 3.9 mm. Liver: Increased parenchymal echogenicity without focal mass. Portal vein is patent on color Doppler imaging with normal direction of blood flow towards the liver. IMPRESSION: No acute findings. Interval increase in size of a  gallbladder polyp measuring 1.2 cm (previously 7 mm). Recommend surgical consultation due to increased risk of malignancy. Electronically Signed   By: Marin Olp M.D.   On: 08/24/2017 10:15    Assessment and Plan:   Judy Allen is a 62 y.o. y/o female with a gallbladder emptying study that showed a 42% ejection fraction with normal being over 33%. The patient did have abdominal pain and nausea with the oral ensure consumption. The patient was also found to have a polyp in the gallbladder that was recorded at 1.2 cm. This polyp was previously 7 mm. Due to the increasing size and the polyp being over 1 cm a surgical consultation will be requested for a cholecystectomy. The patient has been explained the plan and agrees with it.    Lucilla Lame, MD. Marval Regal   Note: This dictation was prepared with Dragon dictation along with smaller phrase technology. Any transcriptional errors that result from this process are unintentional.

## 2017-08-29 NOTE — Progress Notes (Signed)
amb  

## 2017-08-29 NOTE — Telephone Encounter (Signed)
Pt notified of [place] results during office visit today, 08/29/17. She has been referred to [facility] for consultation.

## 2017-08-29 NOTE — Telephone Encounter (Signed)
-----   Message from Lucilla Lame, MD sent at 08/28/2017 12:33 PM EST ----- Let the patient know that her gallbladder showed a polyp that should be evaluated by surgery since it is big enough to be removed. She also had symptoms from drinking mean sure which may indicate she needs to have her gallbladder out.

## 2017-08-30 ENCOUNTER — Encounter: Payer: Self-pay | Admitting: General Surgery

## 2017-08-30 ENCOUNTER — Ambulatory Visit: Payer: Medicaid Other | Admitting: General Surgery

## 2017-08-30 VITALS — BP 132/74 | HR 94 | Resp 16 | Ht 67.0 in | Wt 184.0 lb

## 2017-08-30 DIAGNOSIS — R1011 Right upper quadrant pain: Secondary | ICD-10-CM | POA: Diagnosis not present

## 2017-08-30 DIAGNOSIS — K824 Cholesterolosis of gallbladder: Secondary | ICD-10-CM | POA: Diagnosis not present

## 2017-08-30 LAB — HEMOGLOBIN A1C
Hgb A1c MFr Bld: 6.1 % of total Hgb — ABNORMAL HIGH (ref <5.7)
MEAN PLASMA GLUCOSE: 128 (calc)
eAG (mmol/L): 7.1 (calc)

## 2017-08-30 LAB — HEPATIC FUNCTION PANEL
AG RATIO: 1.8 (calc) (ref 1.0–2.5)
ALBUMIN MSPROF: 4.9 g/dL (ref 3.6–5.1)
ALKALINE PHOSPHATASE (APISO): 72 U/L (ref 33–130)
ALT: 103 U/L — ABNORMAL HIGH (ref 6–29)
AST: 83 U/L — AB (ref 10–35)
BILIRUBIN DIRECT: 0.1 mg/dL (ref 0.0–0.2)
BILIRUBIN TOTAL: 0.4 mg/dL (ref 0.2–1.2)
Globulin: 2.8 g/dL (calc) (ref 1.9–3.7)
Indirect Bilirubin: 0.3 mg/dL (calc) (ref 0.2–1.2)
Total Protein: 7.7 g/dL (ref 6.1–8.1)

## 2017-08-30 LAB — LIPID PANEL
Cholesterol: 229 mg/dL — ABNORMAL HIGH (ref <200)
HDL: 40 mg/dL — ABNORMAL LOW (ref >50)
NON-HDL CHOLESTEROL (CALC): 189 mg/dL — AB (ref <130)
Total CHOL/HDL Ratio: 5.7 (calc) — ABNORMAL HIGH (ref <5.0)
Triglycerides: 548 mg/dL — ABNORMAL HIGH (ref <150)

## 2017-08-30 NOTE — Progress Notes (Signed)
Patient ID: Judy Allen, female   DOB: 1956/06/17, 62 y.o.   MRN: [id number]  Chief Complaint  Patient presents with  . Other    HPI Judy Allen is a 62 y.o. female here today for a evaluation of a gallbladder polyp. Patient states she has been having in her right upper quadrant for six months. She states the pain goes and goes. Any thing she eat can trigger her pain. Nausea after meals. Ultrasound done on 08/24/2017 and HIDA. During the HIDA she states she was having pain and nausea.  HPI  Past Medical History:  Diagnosis Date  . Anemia    H/O  . Anxiety   . Arthritis   . Colon cancer ([place]) 2017   anal ca/chemo and rad  . Complication of anesthesia   . Constipation   . Dysrhythmia    TACHYCARDIA-WELL CONTROLLED ON METOPROLO  . GERD (gastroesophageal reflux disease)   . H/O allergic rhinitis   . Heart murmur   . Hemorrhoid   . Hyperlipidemia   . Hypertension   . Neck pain, chronic   . Neuritis   . Ovarian failure   . Personal history of chemotherapy   . Personal history of radiation therapy   . PONV (postoperative nausea and vomiting)   . Proteinuria   . Psoriasis   . Sleep apnea 09/12/2016   8cm H2O with 21pm oxygen mask: Eson size Medium. Heated humidifier for nasal dryness.  . Tachycardia, paroxysmal ([place])   . Urinary incontinence     Past Surgical History:  Procedure Laterality Date  . ANTERIOR FUSION LUMBAR SPINE    . CARDIAC CATHETERIZATION  2009?    [facility];[place]  . CARDIAC CATHETERIZATION  05/2012   [facility]: Mild hypertension 37/12 with a mean pressure of 21 mm mercury  . COLONOSCOPY  2018  . herniated disc repair    . MASS EXCISION Left 02/23/2016   Procedure: EXCISION MASS;  Surgeon: Christene Lye, MD;  Location: [facility] ORS;  Service: General;  Laterality: Left;  . NECK SURGERY    . PORTACATH PLACEMENT Left 03/12/2016   Procedure: INSERTION PORT-A-CATH;  Surgeon: Christene Lye, MD;  Location: [facility] ORS;  Service: General;  Laterality: Left;  .  tonsillectomy  1959   history  . TUBAL LIGATION     s/p    Family History  Problem Relation Age of Onset  . Heart attack Mother   . Breast cancer Maternal Aunt   . Bladder Cancer Neg Hx   . Kidney cancer Neg Hx     Social History Social History   Tobacco Use  . Smoking status: Former Smoker    Packs/day: 1.00    Years: 32.00    Pack years: 32.00    Types: Cigarettes    Start date: 08/10/1963    Last attempt to quit: 01/15/2006    Years since quitting: 11.6  . Smokeless tobacco: Never Used  Substance Use Topics  . Alcohol use: No    Alcohol/week: 0.0 oz  . Drug use: No    Allergies  Allergen Reactions  . Dapsone Other (See Comments)    Hypoxemia  . Sulfa Antibiotics Rash    Current Outpatient Medications  Medication Sig Dispense Refill  . CVS B-12 1000 MCG TBCR TAKE 1 TABLET (1,000 MCG TOTAL) BY MOUTH DAILY.  11  . divalproex (DEPAKOTE) 250 MG DR tablet Take 1 tablet (250 mg total) by mouth at bedtime. 30 tablet 5  . gabapentin (NEURONTIN) 300 MG capsule  Take 1 capsule (300 mg total) by mouth 2 (two) times daily. 180 capsule 1  . hydrochlorothiazide (HYDRODIURIL) 25 MG tablet Take 1 tablet (25 mg total) by mouth daily. 30 tablet 5  . HYDROcodone-acetaminophen (NORCO/VICODIN) 5-325 MG tablet Take 1 tablet by mouth 2 (two) times daily. 60 tablet 0  . metoprolol succinate (TOPROL-XL) 50 MG 24 hr tablet Take 1 tablet (50 mg total) by mouth daily. 30 tablet 5  . omega-3 acid ethyl esters (LOVAZA) 1 g capsule Take 2 capsules (2 g total) 2 (two) times daily by mouth. 120 capsule 5  . omeprazole (PRILOSEC) 40 MG capsule Take 40 mg by mouth daily.   5  . oxybutynin (DITROPAN-XL) 10 MG 24 hr tablet Take 1 tablet (10 mg total) by mouth daily. 30 tablet 11  . OXYGEN Inhale 2 L into the lungs continuous.     Marland Kitchen tiotropium (SPIRIVA) 18 MCG inhalation capsule Place 18 mcg into inhaler and inhale daily.     Marland Kitchen tiZANidine (ZANAFLEX) 4 MG tablet TAKE 1 TABLET (4 MG TOTAL) BY MOUTH 3  (THREE) TIMES DAILY. 90 tablet 2  . tolterodine (DETROL LA) 4 MG 24 hr capsule Take 1 capsule (4 mg total) by mouth daily. (Patient not taking: Reported on 08/31/2017) 30 capsule 11  . venlafaxine XR (EFFEXOR-XR) 150 MG 24 hr capsule Take 1 capsule (150 mg total) by mouth daily with breakfast. 30 capsule 5  . albuterol (PROVENTIL HFA;VENTOLIN HFA) 108 (90 Base) MCG/ACT inhaler Inhale 2 puffs into the lungs every 6 (six) hours as needed for wheezing or shortness of breath.    . betamethasone dipropionate (DIPROLENE) 0.05 % cream Apply 1 application topically 2 (two) times daily as needed.    . hydrocortisone 2.5 % cream Apply 1 application topically 2 (two) times daily.    Marland Kitchen ketoconazole (NIZORAL) 2 % cream Apply 1 application topically 2 (two) times daily.     No current facility-administered medications for this visit.     Review of Systems Review of Systems  Constitutional: Negative.   Respiratory: Negative.   Cardiovascular: Negative.     Blood pressure 132/74, pulse 94, resp. rate 16, height 5' 7"  (1.702 m), weight 184 lb (83.5 kg), last menstrual period 03/04/2002.  Physical Exam Physical Exam  Constitutional: She is oriented to person, place, and time. She appears well-developed and well-nourished.  Eyes: Conjunctivae are normal. No scleral icterus.  Neck: Neck supple.  Cardiovascular: Normal rate, regular rhythm and normal heart sounds.  Pulmonary/Chest: Effort normal and breath sounds normal.  Abdominal: Soft. Normal appearance and bowel sounds are normal. There is tenderness in the right upper quadrant.    Lymphadenopathy:    She has no cervical adenopathy.  Neurological: She is alert and oriented to person, place, and time.  Skin: Skin is warm and dry.    Data Reviewed October 22, 2016 abdominal ultrasound: IMPRESSION: 7 mm apparent polyp in the gallbladder. Follow-up ultrasound in 1 year is advise, based on consensus guidelines. Gallbladder otherwise appears  unremarkable.  August 24, 2017 abdominal ultrasound: IMPRESSION: No acute findings.  Interval increase in size of a gallbladder polyp measuring 1.2 cm (previously 7 mm). Recommend surgical consultation due to increased risk of malignancy.  August 24, 2017 HIDA scan with ejection fraction IMPRESSION: Normal ejection fraction of radiotracer from the gallbladder. The patient did experience nausea and pain with the oral Ensure consumption. Cystic and common bile ducts are patent as is evidenced by visualization of gallbladder and small bowel.  Marland Kitchen  August 03, 2017 comprehensive metabolic panel notable for an AST of 51, ALT of 64.  Normal alkaline phosphatase and total bilirubin.  Normal renal function and electrolytes. CBC of the same date was unremarkable.  Platelet count of 262,000, hemoglobin of 12.8.  Liver function studies of August 29, 2017 showed an AST of 83, ALT of 103.  Normal bilirubin and alkaline phosphatase.  Normal direct and indirect bilirubin.  GI evaluation of August 29, 2017 reviewed.  Assessment    Enlarging gallbladder polyp    Plan    Indications for surgical resection were reviewed.  The patient reports that the GI office suggested she might be a candidate for liver biopsy at the time of cholecystectomy.  Will contact their office to determine if this is important to them.  Risks associated with liver biopsy reviewed including increased risk of bleeding.  Laparoscopic Cholecystectomy with Intraoperative Cholangiogram. The procedure, including it's potential risks and complications (including but not limited to infection, bleeding, injury to intra-abdominal organs or bile ducts, bile leak, poor cosmetic result, sepsis and death) were discussed with the patient in detail. Non-operative options, including their inherent risks (acute calculous cholecystitis with possible choledocholithiasis or gallstone pancreatitis, with the risk of ascending cholangitis,  sepsis, and death) were discussed as well. The patient expressed and understanding of what we discussed and wishes to proceed with laparoscopic cholecystectomy. The patient further understands that if it is technically not possible, or it is unsafe to proceed laparoscopically, that I will convert to an open cholecystectomy. The patient is aware to use a heating pad.    HPI, Physical Exam, Assessment and Plan have been scribed under the direction and in the presence of Hervey Ard, MD.  Gaspar Cola, CMA  I have completed the exam and reviewed the above documentation for accuracy and completeness.  I agree with the above.  Haematologist has been used and any errors in dictation or transcription are unintentional.  Hervey Ard, M.D., F.A.C.S.  The patient is scheduled for surgery at [facility] on 09/12/17. She will pre admit by phone. The patient is aware of date and instructions.  Documented by Caryl-Lyn Otis Brace LPN   Forest Gleason Byrnett 08/31/2017, 8:04 PM

## 2017-08-30 NOTE — Patient Instructions (Addendum)

## 2017-08-31 ENCOUNTER — Telehealth: Payer: Self-pay | Admitting: *Deleted

## 2017-08-31 ENCOUNTER — Encounter: Payer: Self-pay | Admitting: Family Medicine

## 2017-08-31 DIAGNOSIS — K824 Cholesterolosis of gallbladder: Secondary | ICD-10-CM | POA: Insufficient documentation

## 2017-08-31 DIAGNOSIS — R1011 Right upper quadrant pain: Secondary | ICD-10-CM | POA: Insufficient documentation

## 2017-08-31 NOTE — Telephone Encounter (Signed)
Message left for patient to call the office.   We need to inform patient of Pre-admission Testing appointment that has been scheduled for 09-02-17 at 11 am.

## 2017-08-31 NOTE — Telephone Encounter (Signed)
Patient called the office back and was notified of Pre-admission date and time. She verbalizes understanding.

## 2017-09-01 ENCOUNTER — Telehealth: Payer: Self-pay | Admitting: *Deleted

## 2017-09-01 NOTE — Telephone Encounter (Signed)
-----   Message from Robert Bellow, MD sent at 08/31/2017  8:13 PM EST ----- Please notify the patient that I will likely do a liver biopsy at the time of the procedure well will need a Bard biopsy device.  Thank you

## 2017-09-01 NOTE — Telephone Encounter (Signed)
Patient was notified as instructed. She verbalizes understanding.   Melinda in the O.R. notified as well and aware to have the Bard biopsy device on hand.

## 2017-09-02 ENCOUNTER — Other Ambulatory Visit: Payer: Self-pay

## 2017-09-02 ENCOUNTER — Other Ambulatory Visit: Payer: Self-pay | Admitting: Family Medicine

## 2017-09-02 ENCOUNTER — Encounter
Admission: RE | Admit: 2017-09-02 | Discharge: 2017-09-02 | Disposition: A | Discharge status: Home / Self Care | Payer: Medicaid Other | Source: Ambulatory Visit | Attending: General Surgery | Admitting: General Surgery

## 2017-09-02 DIAGNOSIS — Z01810 Encounter for preprocedural cardiovascular examination: Secondary | ICD-10-CM | POA: Insufficient documentation

## 2017-09-02 DIAGNOSIS — I10 Essential (primary) hypertension: Secondary | ICD-10-CM | POA: Insufficient documentation

## 2017-09-02 DIAGNOSIS — G8929 Other chronic pain: Secondary | ICD-10-CM

## 2017-09-02 DIAGNOSIS — M542 Cervicalgia: Principal | ICD-10-CM

## 2017-09-02 HISTORY — DX: Chronic obstructive pulmonary disease, unspecified: J44.9

## 2017-09-02 HISTORY — DX: Major depressive disorder, single episode, unspecified: F32.9

## 2017-09-02 HISTORY — DX: Prediabetes: R73.03

## 2017-09-02 HISTORY — DX: Depression, unspecified: F32.A

## 2017-09-02 MED ORDER — HYDROCODONE-ACETAMINOPHEN 5-325 MG PO TABS: 1.0000 | ORAL_TABLET | Freq: Two times a day (BID) | ORAL | 0 refills | Status: DC

## 2017-09-02 NOTE — OR Nursing (Signed)
ECG 12 Lead1/29/2018 [facility] Component Name Value Ref Range  EKG Ventricular Rate 85 BPM   EKG Atrial Rate 85 BPM   EKG P-R Interval 144 ms  EKG QRS Duration 78 ms  EKG Q-T Interval 370 ms  EKG QTC Calculation 440 ms  EKG Calculated P Axis 31 degrees   EKG Calculated R Axis 6 degrees   EKG Calculated T Axis 23 degrees   Result Narrative  NORMAL SINUS RHYTHM NORMAL ECG WHEN COMPARED WITH ECG OF 28-Jul-2016 14:45, NO SIGNIFICANT CHANGE WAS FOUND Confirmed by ROSE-JONES, LISA ([id number]) on 09/06/2016 8:38:34 PM

## 2017-09-02 NOTE — Pre-Procedure Instructions (Signed)
Patient has a cpap but does not use it. On 2liters nasal prongs 24 hours/day

## 2017-09-02 NOTE — Patient Instructions (Signed)
Your procedure is scheduled on: Monday, February 4,TH  Report to [place]  To find out your arrival time please call [phone] between 1PM - 3PM on Friday, February 1ST  Remember: Instructions that are not followed completely may result in serious medical risk, up to and including death, or upon the discretion of your surgeon and anesthesiologist your surgery may need to be rescheduled.     _X__ 1. Do not eat food after midnight the night before your procedure.                 No gum chewing or hard candies.                   You may drink clear liquids up to 2 hours before you are scheduled to arrive for your surgery-                  Clear Liquids include:  water, apple juice without pulp, clear carbohydrate                 drink such as Clearfast of Gatorade, Black Coffee or Tea (Do not add                 anything to coffee or tea).  __X__2.   On the morning of surgery brush your teeth with toothpaste and water, you may rinse your mouth with mouthwash if you wish.                   Do not swallow any toothpaste of mouthwash.     _X__ 3.  No Alcohol for 24 hours before or after surgery.   _X__ 4.  Do Not Smoke or use e-cigarettes For 24 Hours Prior to Your Surgery.                 Do not use any chewable tobacco products for at least 6 hours prior to                 surgery.  ____  5.  Bring all medications with you on the day of surgery if instructed.   _x___  6.  Notify your doctor if there is any change in your medical condition      (cold, fever, infections).     Do not wear jewelry, make-up, hairpins, clips or nail polish. Do not wear lotions, powders, or perfumes. You may wear deodorant. Do not shave 48 hours prior to surgery. Men may shave face and neck. Do not bring valuables to the hospital.    [facility] is not responsible for any belongings or valuables.  Contacts, dentures or bridgework may not be worn into  surgery. Leave your suitcase in the car. After surgery it may be brought to your room. For patients admitted to the hospital, discharge time is determined by your treatment team.   Patients discharged the day of surgery will not be allowed to drive home.   Please read over the following fact sheets that you were given:   PREPARING FOR SURGERY   ____ Take these medicines the morning of surgery with A SIP OF WATER:    1. METOPROLOL  2. PRILOSEC  3. DITROPAN  4. EFFEXOR  5. NEURONTIN  6. SPIRIVA AND PROVENTIL (BRING TO THE  HOSPITAL WITH YOU)             7. NORCO, IF NEEDED FOR NECK PAIN   ____ Fleet Enema (as  directed)   __X__ Use CHG Soap as directed  __X__ Use inhalers on the day of surgery  ____ Stop ANY ASPIRIN PRODUCTS AS OF TODAY  ____ Stop Anti-inflammatories AS OF TODAY.                    THIS INCLUDES IBUPROFEN / MOTRIN / ADVIL / ALEVE / NAPROSYN / GOODIES POWDERS / BC POWDER                  THESE WILL BE GOOD TO TAKE AFTER SURGERY ALONG WITH PAIN MEDICINE.                      TAKE TYLENOL OR [place] FOR PAIN PRIOR TO SURGERY.   __X__ Stop supplements until after surgery.  STOP LOVAZA NOW.  YOU CAN CONTINUE WITH VITAMIN B12 BUT DO NOT TAKE THIS ON THE                        DAY OF SURGERY.  ____ Bring C-Pap to the hospital.   DO NOT TAKE HYDRODIURIL ON DAY OF SURGERY BUT DO TAKE IT UP UNTIL THEN.  HAVE STOOL SOFTENERS AVAILABLE ONCE HOME  WEAR LOOSE FITTING CLOTHING FOR SURGERY.

## 2017-09-06 ENCOUNTER — Encounter: Payer: Self-pay | Admitting: Gastroenterology

## 2017-09-07 ENCOUNTER — Encounter: Payer: Self-pay | Admitting: Student in an Organized Health Care Education/Training Program

## 2017-09-07 ENCOUNTER — Ambulatory Visit
Payer: Medicaid Other | Attending: Student in an Organized Health Care Education/Training Program | Admitting: Student in an Organized Health Care Education/Training Program

## 2017-09-07 ENCOUNTER — Other Ambulatory Visit: Payer: Self-pay

## 2017-09-07 VITALS — BP 133/82 | HR 85 | Temp 97.6°F | Resp 16 | Ht 64.0 in | Wt 180.0 lb

## 2017-09-07 DIAGNOSIS — Z882 Allergy status to sulfonamides status: Secondary | ICD-10-CM | POA: Diagnosis not present

## 2017-09-07 DIAGNOSIS — G4730 Sleep apnea, unspecified: Secondary | ICD-10-CM | POA: Diagnosis not present

## 2017-09-07 DIAGNOSIS — D649 Anemia, unspecified: Secondary | ICD-10-CM | POA: Diagnosis not present

## 2017-09-07 DIAGNOSIS — R7303 Prediabetes: Secondary | ICD-10-CM | POA: Insufficient documentation

## 2017-09-07 DIAGNOSIS — Z923 Personal history of irradiation: Secondary | ICD-10-CM | POA: Insufficient documentation

## 2017-09-07 DIAGNOSIS — M4722 Other spondylosis with radiculopathy, cervical region: Secondary | ICD-10-CM | POA: Insufficient documentation

## 2017-09-07 DIAGNOSIS — Z9221 Personal history of antineoplastic chemotherapy: Secondary | ICD-10-CM | POA: Insufficient documentation

## 2017-09-07 DIAGNOSIS — M47812 Spondylosis without myelopathy or radiculopathy, cervical region: Secondary | ICD-10-CM

## 2017-09-07 DIAGNOSIS — M542 Cervicalgia: Secondary | ICD-10-CM | POA: Insufficient documentation

## 2017-09-07 DIAGNOSIS — Z9889 Other specified postprocedural states: Secondary | ICD-10-CM | POA: Insufficient documentation

## 2017-09-07 DIAGNOSIS — Z9981 Dependence on supplemental oxygen: Secondary | ICD-10-CM | POA: Diagnosis not present

## 2017-09-07 DIAGNOSIS — F329 Major depressive disorder, single episode, unspecified: Secondary | ICD-10-CM | POA: Diagnosis not present

## 2017-09-07 DIAGNOSIS — M1990 Unspecified osteoarthritis, unspecified site: Secondary | ICD-10-CM | POA: Diagnosis not present

## 2017-09-07 DIAGNOSIS — I10 Essential (primary) hypertension: Secondary | ICD-10-CM | POA: Diagnosis not present

## 2017-09-07 DIAGNOSIS — K219 Gastro-esophageal reflux disease without esophagitis: Secondary | ICD-10-CM | POA: Insufficient documentation

## 2017-09-07 DIAGNOSIS — R32 Unspecified urinary incontinence: Secondary | ICD-10-CM | POA: Insufficient documentation

## 2017-09-07 DIAGNOSIS — G8929 Other chronic pain: Secondary | ICD-10-CM | POA: Diagnosis not present

## 2017-09-07 DIAGNOSIS — Z9851 Tubal ligation status: Secondary | ICD-10-CM | POA: Insufficient documentation

## 2017-09-07 DIAGNOSIS — Z888 Allergy status to other drugs, medicaments and biological substances status: Secondary | ICD-10-CM | POA: Diagnosis not present

## 2017-09-07 DIAGNOSIS — G894 Chronic pain syndrome: Secondary | ICD-10-CM | POA: Insufficient documentation

## 2017-09-07 DIAGNOSIS — Z825 Family history of asthma and other chronic lower respiratory diseases: Secondary | ICD-10-CM | POA: Insufficient documentation

## 2017-09-07 DIAGNOSIS — Z85038 Personal history of other malignant neoplasm of large intestine: Secondary | ICD-10-CM | POA: Diagnosis not present

## 2017-09-07 DIAGNOSIS — Z803 Family history of malignant neoplasm of breast: Secondary | ICD-10-CM | POA: Insufficient documentation

## 2017-09-07 DIAGNOSIS — J449 Chronic obstructive pulmonary disease, unspecified: Secondary | ICD-10-CM | POA: Diagnosis not present

## 2017-09-07 DIAGNOSIS — Z79899 Other long term (current) drug therapy: Secondary | ICD-10-CM | POA: Diagnosis not present

## 2017-09-07 DIAGNOSIS — Z8249 Family history of ischemic heart disease and other diseases of the circulatory system: Secondary | ICD-10-CM | POA: Insufficient documentation

## 2017-09-07 DIAGNOSIS — Z981 Arthrodesis status: Secondary | ICD-10-CM | POA: Diagnosis not present

## 2017-09-07 DIAGNOSIS — Z87891 Personal history of nicotine dependence: Secondary | ICD-10-CM | POA: Insufficient documentation

## 2017-09-07 DIAGNOSIS — Z800 Family history of malignant neoplasm of digestive organs: Secondary | ICD-10-CM | POA: Insufficient documentation

## 2017-09-07 DIAGNOSIS — E785 Hyperlipidemia, unspecified: Secondary | ICD-10-CM | POA: Insufficient documentation

## 2017-09-07 MED ORDER — GABAPENTIN 300 MG PO CAPS: 300.0000 mg | ORAL_CAPSULE | Freq: Three times a day (TID) | ORAL | 3 refills | Status: DC

## 2017-09-07 NOTE — Patient Instructions (Addendum)
1. UDS today 2. Increase Gabapentin 300 mg three times a day 3. Schedule for bilateral C4, 5,6,7 medial branch blocks with sedation   Facet Joint Block The facet joints connect the bones of the spine (vertebrae). They make it possible for you to bend, twist, and make other movements with your spine. They also keep you from bending too far, twisting too far, and making other excessive movements. A facet joint block is a procedure where a numbing medicine (anesthetic) is injected into a facet joint. Often, a type of anti-inflammatory medicine called a steroid is also injected. A facet joint block may be done to diagnose neck or back pain. If the pain gets better after a facet joint block, it means the pain is probably coming from the facet joint. If the pain does not get better, it means the pain is probably not coming from the facet joint. A facet joint block may also be done to relieve neck or back pain caused by an inflamed facet joint. A facet joint block is only done to relieve pain if the pain does not improve with other methods, such as medicine, exercise programs, and physical therapy. Tell a health care provider about:  Any allergies you have.  All medicines you are taking, including vitamins, herbs, eye drops, creams, and over-the-counter medicines.  Any problems you or family members have had with anesthetic medicines.  Any blood disorders you have.  Any surgeries you have had.  Any medical conditions you have.  Whether you are pregnant or may be pregnant. What are the risks? Generally, this is a safe procedure. However, problems may occur, including:  Bleeding.  Injury to a nerve near the injection site.  Pain at the injection site.  Weakness or numbness in areas controlled by nerves near the injection site.  Infection.  Temporary fluid retention.  Allergic reactions to medicines or dyes.  Injury to other structures or organs near the injection site.  What happens  before the procedure?  Follow instructions from your health care provider about eating or drinking restrictions.  Ask your health care provider about: ? Changing or stopping your regular medicines. This is especially important if you are taking diabetes medicines or blood thinners. ? Taking medicines such as aspirin and ibuprofen. These medicines can thin your blood. Do not take these medicines before your procedure if your health care provider instructs you not to.  Do not take any new dietary supplements or medicines without asking your health care provider first.  Plan to have someone take you home after the procedure. What happens during the procedure?  You may need to remove your clothing and dress in an open-back gown.  The procedure will be done while you are lying on an X-ray table. You will most likely be asked to lie on your stomach, but you may be asked to lie in a different position if an injection will be made in your neck.  Machines will be used to monitor your oxygen levels, heart rate, and blood pressure.  If an injection will be made in your neck, an IV tube will be inserted into one of your veins. Fluids and medicine will flow directly into your body through the IV tube.  The area over the facet joint where the injection will be made will be cleaned with soap. The surrounding skin will be covered with clean drapes.  A numbing medicine (local anesthetic) will be applied to your skin. Your skin may sting or burn for a  moment.  A video X-ray machine (fluoroscopy) will be used to locate the joint. In some cases, a CT scan may be used.  A contrast dye may be injected into the facet joint area to help locate the joint.  When the joint is located, an anesthetic will be injected into the joint through the needle.  Your health care provider will ask you whether you feel pain relief. If you do feel relief, a steroid may be injected to provide pain relief for a longer period of  time. If you do not feel relief or feel only partial relief, additional injections of an anesthetic may be made in other facet joints.  The needle will be removed.  Your skin will be cleaned.  A bandage (dressing) will be applied over each injection site. The procedure may vary among health care providers and hospitals. What happens after the procedure?  You will be observed for 15-30 minutes before being allowed to go home. This information is not intended to replace advice given to you by your health care provider. Make sure you discuss any questions you have with your health care provider. Document Released: 12/15/2006 Document Revised: 08/27/2015 Document Reviewed: 04/21/2015 [employer] Interactive Patient Education  [employer].

## 2017-09-07 NOTE — Progress Notes (Signed)
Safety precautions to be maintained throughout the outpatient stay will include: orient to surroundings, keep bed in low position, maintain call bell within reach at all times, provide assistance with transfer out of bed and ambulation.  

## 2017-09-07 NOTE — Progress Notes (Signed)
Patient's Name: Judy Allen  MRN: [id number]  Referring Provider: Steele Sizer, MD  DOB: 14-Sep-1955  PCP: Steele Sizer, MD  DOS: 09/07/2017  Note by: Gillis Santa, MD  Service setting: Ambulatory outpatient  Specialty: Interventional Pain Management  Location: [facility] (AMB) Pain Management Facility  Visit type: Initial Patient Evaluation  Patient type: New Patient   Primary Reason(s) for Visit: Encounter for initial evaluation of one or more chronic problems (new to examiner) potentially causing chronic pain, and posing a threat to normal musculoskeletal function. (Level of risk: High) CC: Neck Pain (mid) and Back Pain (lower)  HPI  Judy Allen is a 62 y.o. year old, female patient, who comes today to see [place] for the first time for an initial evaluation of her chronic pain. She has Tachycardia, paroxysmal ([place]); Benign hypertension; Chronic cervical pain; CN (constipation); Gastric reflux; Hypertriglyceridemia; Edema leg; Chronic recurrent major depressive disorder ([place]); Dysmetabolic syndrome; Obstructive apnea; Psoriasis; Allergic rhinitis; Bursitis, trochanteric; GAD (generalized anxiety disorder); History of fusion of cervical spine; Chronic constipation; Non-thrombocytopenic purpura ([place]); Metastatic squamous cell carcinoma ([place]); Anal squamous cell carcinoma ([place]); Abnormal Pap smear of cervix; Centrilobular emphysema ([place]); Chronic respiratory failure with hypoxia, on home oxygen therapy ([place]); Gallstones; Fatty liver; Abdominal pain, right upper quadrant; and Gallbladder polyp on their problem list. Today she comes in for evaluation of her Neck Pain (mid) and Back Pain (lower)  Pain Assessment: Location: Left Neck Radiating: shoulder down arm to all finger tips, gott worse the last 3 months Onset: More than a month ago Duration: Chronic pain Quality: Aching, Tingling, Numbness Severity: 6 /10 (self-reported pain score)  Note: Reported level is inconsistent with clinical observations.  Clinically the patient looks like a 3/10 A 3/10 is viewed as "Moderate" and described as significantly interfering with activities of daily living (ADL). It becomes difficult to feed, bathe, get dressed, get on and off the toilet or to perform personal hygiene functions. Difficult to get in and out of bed or a chair without assistance. Very distracting. With effort, it can be ignored when deeply involved in activities.       When using our objective Pain Scale, levels between 6 and 10/10 are said to belong in an emergency room, as it progressively worsens from a 6/10, described as severely limiting, requiring emergency care not usually available at an outpatient pain management facility. At a 6/10 level, communication becomes difficult and requires great effort. Assistance to reach the emergency department may be required. Facial flushing and profuse sweating along with potentially dangerous increases in heart rate and blood pressure will be evident. Effect on ADL:  ' I do things anyway", Driving, looking Timing: Constant Modifying factors:    Onset and Duration: Present longer than 3 months Cause of pain: Unknown Severity: No change since onset and NAS-11 on the average: 10/10 Timing: Not influenced by the time of the day Aggravating Factors: Lifiting, Motion, Prolonged sitting and Prolonged standing Alleviating Factors: nothing Associated Problems: Depression, Fatigue, Inability to control bladder (urine), Nausea, Numbness, Tingling and Pain that does not allow patient to sleep Quality of Pain: Aching, Burning, Sharp and Shooting Previous Examinations or Tests: MRI scan Previous Treatments: Narcotic medications  The patient comes into the clinics today for the first time for a chronic pain management evaluation.  62 year old female who presents with a chief complaint of neck pain and shoulder pain that radiates down into her bilateral arms, left greater than right.  Patient has a history of 2  cervical spine surgeries,  ACDF from C4-C7.  Last of which was approximately 9 years ago.  Patient states that the pain is similar to what it was prior to her last surgery.  She had an updated MRI that was done at [facility] in December 2018 that is listed under care everywhere.  Interestingly, even though the patient has worsening clinical symptoms, her MRI from 2018 was largely unremarkable except for C2/3 facet arthropathy.  Her MRI from 2017 showed greater pathology even though her symptoms have worsened since then.  Patient states that her neck pain also radiates down her thoracic spine.  She has been on chronic opioid therapy which consists of hydrocodone 5 mg twice daily as needed, quantity 60 a month.  MME equals 10.  She states that this medication has become less effective.  The patient also takes tizanidine as needed for muscle spasms and sleep.  Patient is on gabapentin 300 mill grams twice daily as well as Effexor 150 mg daily.  Patient denies alcohol, drug, substance abuse.  Today I took the time to provide the patient with information regarding my pain practice. The patient was informed that my practice is divided into two sections: an interventional pain management section, as well as a completely separate and distinct medication management section. I explained that I have procedure days for my interventional therapies, and evaluation days for follow-ups and medication management. Because of the amount of documentation required during both, they are kept separated. This means that there is the possibility that she may be scheduled for a procedure on one day, and medication management the next. I have also informed her that because of staffing and facility limitations, I no longer take patients for medication management only. To illustrate the reasons for this, I gave the patient the example of surgeons, and how inappropriate it would be to refer a patient to his/her care, just to write for the  post-surgical antibiotics on a surgery done by a different surgeon.   Because interventional pain management is my board-certified specialty, the patient was informed that joining my practice means that they are open to any and all interventional therapies. I made it clear that this does not mean that they will be forced to have any procedures done. What this means is that I believe interventional therapies to be essential part of the diagnosis and proper management of chronic pain conditions. Therefore, patients not interested in these interventional alternatives will be better served under the care of a different practitioner.  The patient was also made aware of my Comprehensive Pain Management Safety Guidelines where by joining my practice, they limit all of their nerve blocks and joint injections to those done by our practice, for as long as we are retained to manage their care.   Historic Controlled Substance Pharmacotherapy Review  PMP and historical list of controlled substances: Hydrocodone 5 mg, quantity 60, last fill 08/11/2017, MME equals 10 Medications: The patient did not bring the medication(s) to the appointment, as requested in our "New Patient Package" Pharmacodynamics: Desired effects: Analgesia: The patient reports >50% benefit. Reported improvement in function: The patient reports medication allows her to accomplish basic ADLs. Clinically meaningful improvement in function (CMIF): Sustained CMIF goals met Perceived effectiveness: Described as relatively effective, allowing for increase in activities of daily living (ADL) Undesirable effects: Side-effects or Adverse reactions: None reported Historical Monitoring: The patient  reports that she does not use drugs. List of all UDS Test(s): No results found for: MDMA, COCAINSCRNUR, PCPSCRNUR, [place], CANNABQUANT, THCU, [place]  List of other Serum/Urine Drug Screening Test(s):  No results found for: AMPHSCRSER, BARBSCRSER,  BENZOSCRSER, COCAINSCRSER, COCAINSCRNUR, PCPSCRSER, PCPQUANT, THCSCRSER, THCU, CANNABQUANT, OPIATESCRSER, OXYSCRSER, PROPOXSCRSER, ETH Historical Background Evaluation: [place] PMP: Six (6) year initial data search conducted.             [place] Department of public safety, offender search: Editor, commissioning Information) Non-contributory Risk Assessment Profile: Aberrant behavior: None observed or detected today Risk factors for fatal opioid overdose: None identified today Fatal overdose hazard ratio (HR): Calculation deferred Non-fatal overdose hazard ratio (HR): Calculation deferred Risk of opioid abuse or dependence: 0.7-3.0% with doses ? 36 MME/day and 6.1-26% with doses ? 120 MME/day. Substance use disorder (SUD) risk level: Low Opioid risk tool (ORT) (Total Score): 3 Opioid Risk Tool - 09/07/17 1054      Family History of Substance Abuse   Alcohol  Negative    Illegal Drugs  Negative    Rx Drugs  Negative      Personal History of Substance Abuse   Alcohol  Negative    Illegal Drugs  Negative    Rx Drugs  Negative      Psychological Disease   Psychological Disease  Positive    ADD  Negative    OCD  Negative    Bipolar  Negative    Schizophrenia  Positive    Depression  Positive      Total Score   Opioid Risk Tool Scoring  3    Opioid Risk Interpretation  Low Risk      ORT Scoring interpretation table:  Score <3 = Low Risk for SUD  Score between 4-7 = Moderate Risk for SUD  Score >8 = High Risk for Opioid Abuse   PHQ-2 Depression Scale:  Total score: 0  PHQ-2 Scoring interpretation table: (Score and probability of major depressive disorder)  Score 0 = No depression  Score 1 = 15.4% Probability  Score 2 = 21.1% Probability  Score 3 = 38.4% Probability  Score 4 = 45.5% Probability  Score 5 = 56.4% Probability  Score 6 = 78.6% Probability   PHQ-9 Depression Scale:  Total score: 0  PHQ-9 Scoring interpretation table:  Score 0-4 = No depression  Score 5-9 = Mild depression  Score  10-14 = Moderate depression  Score 15-19 = Moderately severe depression  Score 20-27 = Severe depression (2.4 times higher risk of SUD and 2.89 times higher risk of overuse)   Pharmacologic Plan: As per protocol, I have not taken over any controlled substance management, pending the results of ordered tests and/or consults.            Initial impression: Pending review of available data and ordered tests.  Meds   Current Outpatient Medications:  .  albuterol (PROVENTIL HFA;VENTOLIN HFA) 108 (90 Base) MCG/ACT inhaler, Inhale 2 puffs into the lungs every 6 (six) hours as needed for wheezing or shortness of breath., Disp: , Rfl:  .  betamethasone dipropionate (DIPROLENE) 0.05 % cream, Apply 1 application topically 2 (two) times daily as needed., Disp: , Rfl:  .  [employer] B-12 1000 MCG TBCR, TAKE 1 TABLET (1,000 MCG TOTAL) BY MOUTH DAILY., Disp: , Rfl: 11 .  divalproex (DEPAKOTE) 250 MG DR tablet, Take 1 tablet (250 mg total) by mouth at bedtime., Disp: 30 tablet, Rfl: 5 .  gabapentin (NEURONTIN) 300 MG capsule, Take 1 capsule (300 mg total) by mouth 3 (three) times daily., Disp: 90 capsule, Rfl: 3 .  hydrochlorothiazide (HYDRODIURIL) 25 MG tablet, Take 1 tablet (  25 mg total) by mouth daily., Disp: 30 tablet, Rfl: 5 .  HYDROcodone-acetaminophen (NORCO/VICODIN) 5-325 MG tablet, Take 1 tablet by mouth 2 (two) times daily., Disp: 60 tablet, Rfl: 0 .  hydrocortisone 2.5 % cream, Apply 1 application topically 2 (two) times daily., Disp: , Rfl:  .  ketoconazole (NIZORAL) 2 % cream, Apply 1 application topically 2 (two) times daily., Disp: , Rfl:  .  metoprolol succinate (TOPROL-XL) 50 MG 24 hr tablet, Take 1 tablet (50 mg total) by mouth daily., Disp: 30 tablet, Rfl: 5 .  omega-3 acid ethyl esters (LOVAZA) 1 g capsule, Take 2 capsules (2 g total) 2 (two) times daily by mouth., Disp: 120 capsule, Rfl: 5 .  omeprazole (PRILOSEC) 40 MG capsule, Take 40 mg by mouth daily. , Disp: , Rfl: 5 .  oxybutynin  (DITROPAN-XL) 10 MG 24 hr tablet, Take 1 tablet (10 mg total) by mouth daily., Disp: 30 tablet, Rfl: 11 .  OXYGEN, Inhale 2 L into the lungs continuous. , Disp: , Rfl:  .  tiotropium (SPIRIVA) 18 MCG inhalation capsule, Place 18 mcg into inhaler and inhale daily. , Disp: , Rfl:  .  tiZANidine (ZANAFLEX) 4 MG tablet, TAKE 1 TABLET (4 MG TOTAL) BY MOUTH 3 (THREE) TIMES DAILY., Disp: 90 tablet, Rfl: 2 .  venlafaxine XR (EFFEXOR-XR) 150 MG 24 hr capsule, Take 1 capsule (150 mg total) by mouth daily with breakfast., Disp: 30 capsule, Rfl: 5 .  tolterodine (DETROL LA) 4 MG 24 hr capsule, Take 1 capsule (4 mg total) by mouth daily. (Patient not taking: Reported on 08/31/2017), Disp: 30 capsule, Rfl: 11  Imaging Review  Cervical Imaging: Cervical MR wo contrast:  Results for orders placed during the hospital encounter of 10/20/15  MR Cervical Spine Wo Contrast   Narrative CLINICAL DATA:  Previous cervical fusion. Neck pain with left arm pain and tingling, 6 months duration.  EXAM: MRI CERVICAL SPINE WITHOUT CONTRAST  TECHNIQUE: Multiplanar, multisequence MR imaging of the cervical spine was performed. No intravenous contrast was administered.  COMPARISON:  Radiography 03/05/2013. CT 02/16/2008. MRI 07/18/2007.  FINDINGS: Foramen magnum is widely patent.  C1-2 is normal.  C2-3: There is bilateral facet arthropathy with edematous change on the right, likely to be painful. 2 mm of anterolisthesis with mild bulging of the disc. No compressive stenosis.  C3-4: No disc pathology. Chronic facet fusion on the right. No stenosis.  C4 through C7: Previous ACDF. Wide patency of the canal. No significant foraminal stenosis suspected.  C7-T1: Facet degeneration with 2 mm of anterolisthesis. Mild bulging of the disc. No central canal stenosis. Mild foraminal narrowing bilaterally without visible compression of the exiting C8 nerve roots. No gross facet edema.  IMPRESSION: Good appearance in the  fusion segment from C4 through C7.  Facet arthropathy at C2-3 with pronounced edematous change on the right. This is likely to be a cause of neck pain. No apparent compressive stenosis.  Facet degeneration at C7-T1 without edema. This could be painful, though the absence of gross edema suggests that this may not be as important as the findings at C2-3.   Electronically Signed   By: Nelson Chimes M.D.   On: 10/20/2015 14:03     Cervical CT wo contrast:  Results for orders placed in visit on 06/16/99  CT Cervical Spine Wo Contrast   Narrative FINDINGS CLINICAL:  C-SPINE FUSION - LT ARM PAIN & NUMBNESS. CT CERVICAL SPINE: SCANS WERE PERFORMED FROM C3 TO T1 AT 3MM INTERVALS.  BOTH  SAGITTAL AND CORONAL RECONSTRUCTIONS WERE PERFORMED.  SOFT TISSUE AND BONE WINDOW IMAGES WERE PRINTED. OBVIOUSLY, CT OF THE NECK IS NOT OPTIMAL, BUT IN VIEW OF THE METALLIC PLATE AND SCREWS, MR HAD LOCAL FIELD DEFECT  AT THE LEVEL OF THE FUSION WHICH IS THE C5-6 LEVEL. AT C3-4 NO SIGNIFICANT ABNORMALITY IS SEEN. AT C4-5 THERE IS VERY MINIMAL INCREASE IN THE ATTENUATION ON IMAGE #6 AT THE SUPERIOR ASPECT OF THE LEFT LATERAL RECESS.  SCANS BELOW THIS LEVEL SHOW NO ABNORMALITY AND I BELIEVE THIS PROBABLY IS ARTIFACTUAL. AT C5-6 THERE IS SOFT TISSUE FULLNESS TO THE LATERAL RECESSES, LEFT MORE SO THAN RIGHT.  PRESUMABLY THIS IS POST-OPERATIVE IN NATURE BUT IT MAKES EXCLUSION OF A LATERAL HNP EXTREMELY DIFFICULT.  CT OF THE NECK FOLLOWING CERVICAL MYELOGRAPHY WOULD BE MORE SENSITIVE.  THE INTERBODY FUSION PLUG AT C5-6 IS IN GOOD POSITION AND DOES NOT IMPINGE UPON THE CENTRAL CANAL.  THE SCREWS AT C5 AND C6 ALSO ARE WELL POSITIONED AND DO NOT PROTRUDE BEYOND THE BONY MARGINS OF THE VERTEBRAL BODIES.  NO IMPINGEMENT UPON THE LATERAL RECESSES IS SEEN. C6-7 AND C7-T1 LEVELS APPEAR NORMAL. MULTI-PLANAR RECONSTRUCTION: MULTI-PLANAR RECONSTRUCTION WAS PERFORMED IN BOTH SAGITTAL AND CORONAL PLANES.  AGAIN, THE  FIXATION PLATE AND SCREWS AND THE INTERBODY FUSION PLUG APPEAR TO BE IN GOOD POSITION WITH NO IMPINGEMENT UPON THE CENTRA CANAL .  NORMAL ALIGNMENT IS MAINTAINED.  NO SIGNIFICANT PREVERTEBRAL SOFT TISSUE SWELLING IS SEEN. IMPRESSION RECONSTRUCTED IMAGES SHOW THE ANTERIOR FUSION AT C5-6 TO BE IN GOOD POSITION WITH NORMAL ALIGNMENT MAINTAINED. THE IS SOFT TISSUE FULLNESS TO THE LATERAL RECESSES AT C5/6, MAKING EXCLUSION OF HNP DIFFICULT. CONSIDER CERVICAL MYELOGRAM  FOLLOWED BY CT.   Results for orders placed in visit on 08/22/98  DG Myelogram Cervical   Narrative FINDINGS CLINICAL DATA:  NECK PAIN. POST MYELOGRAM CT: A LUMBAR PUNCTURE WAS PERFORMED BY DR. CABBELL.  THERE WAS MIXED INJECTION PRIMARILY OF EPIDURAL AND SUBDURAL COMPONENTS.  THERE WAS NO DIAGNOSTIC MYELOGRAM FILMS OBTAINED. POST MYELOGRAM CT: 2.5 MM CUTS WERE OBTAINED THROUGH THE CERVICAL REGION.  THERE IS NO SUBARACHNOID CONTRAST PRESENT. IMPRESSION CERVICAL MYELOGRAM AND POST MYELOGRAM CT WITHOUT DIAGNOSTIC SUBARACHNOID CONTRAST.     Lumbosacral Imaging: Lumbar MR wo contrast:  Results for orders placed during the hospital encounter of 02/28/15  MR Lumbar Spine Wo Contrast   Narrative CLINICAL DATA:  Lumbar radiculitis to the left leg.  EXAM: MRI LUMBAR SPINE WITHOUT CONTRAST  TECHNIQUE: Multiplanar, multisequence MR imaging of the lumbar spine was performed. No intravenous contrast was administered.  COMPARISON:  None.  FINDINGS: No marrow signal abnormality suggestive of fracture, discitis, or neoplasm. An incidental hemangiomas noted in the L4 body. Normal conus signal and morphology. No perispinal abnormality to explain back pain.  Duplicated IVC.  Degenerative changes:  T12- L1: Unremarkable.  L1-L2: Unremarkable.  L2-L3: Mild disc narrowing and bulging. Small facet effusions without notable overgrowth. No impingement  L3-L4: Mild disc narrowing and bulging. Small bilateral facet effusions.  There is partial effacement of the left subarticular recess with L4 nerve contact but no compression.  L4-L5: Disc narrowing and bulging, eccentric to the left. Facet arthropathy with small effusions and marrow/perispinal edema on the left. Left subarticular recess stenosis with potential L5 impingement, especially if there is dynamic instability.  L5-S1:Disc narrowing and endplate spurring. Narrowing of the disc narrows the foramen without foraminal nerve compression.  IMPRESSION: 1. Degenerative disc and facet disease with notable inflammatory features at the left L4-5 facet. 2. Potential left L5  impingement at the L4-5 subarticular recess.   Electronically Signed   By: Monte Fantasia M.D.   On: 02/28/2015 09:24    Lumbar DG (Complete) 4+V:  Results for orders placed during the hospital encounter of 12/13/14  DG Lumbar Spine Complete   Narrative CLINICAL DATA:  Low back pain for 7 weeks.  Initial evaluation  EXAM: LUMBAR SPINE - COMPLETE 4+ VIEW  COMPARISON:  None.  FINDINGS: Multilevel degenerative change. Mild scoliosis lumbar spine concave left. No acute bony abnormality identified.  IMPRESSION: Multilevel degenerative change lumbar spine with scoliosis concave left. No acute abnormality identified.   Electronically Signed   By: Marcello Moores  Register   On: 12/13/2014 12:39       Complexity Note: Imaging results reviewed. Results shared with Ms. Garno, using Layman's terms.                         ROS  Cardiovascular History: High blood pressure Pulmonary or Respiratory History: Lung problems, Shortness of breath and Temporary stoppage of breathing during sleep Neurological History: No reported neurological signs or symptoms such as seizures, abnormal skin sensations, urinary and/or fecal incontinence, being born with an abnormal open spine and/or a tethered spinal cord Review of Past Neurological Studies: No results found for this or any previous  visit. Psychological-Psychiatric History: Anxiousness Gastrointestinal History: Reflux or heatburn Genitourinary History: No reported renal or genitourinary signs or symptoms such as difficulty voiding or producing urine, peeing blood, non-functioning kidney, kidney stones, difficulty emptying the bladder, difficulty controlling the flow of urine, or chronic kidney disease Hematological History: No reported hematological signs or symptoms such as prolonged bleeding, low or poor functioning platelets, bruising or bleeding easily, hereditary bleeding problems, low energy levels due to low hemoglobin or being anemic Endocrine History: No reported endocrine signs or symptoms such as high or low blood sugar, rapid heart rate due to high thyroid levels, obesity or weight gain due to slow thyroid or thyroid disease Rheumatologic History: No reported rheumatological signs and symptoms such as fatigue, joint pain, tenderness, swelling, redness, heat, stiffness, decreased range of motion, with or without associated rash Musculoskeletal History: Negative for myasthenia gravis, muscular dystrophy, multiple sclerosis or malignant hyperthermia Work History: Unemployed  Allergies  Ms. Stetler is allergic to dapsone and sulfa antibiotics.  Laboratory Chemistry  Inflammation Markers (CRP: Acute Phase) (ESR: Chronic Phase) No results found for: CRP, ESRSEDRATE, LATICACIDVEN               Rheumatology Markers No results found for: RF, ANA, Therisa [place], [facility]              Renal Function Markers Lab Results  Component Value Date   BUN 12 08/03/2017   CREATININE 0.87 08/03/2017   GFRAA 83 08/03/2017   GFRNONAA 71 08/03/2017                 Hepatic Function Markers Lab Results  Component Value Date   AST 83 (H) 08/29/2017   ALT 103 (H) 08/29/2017   ALBUMIN 4.3 03/03/2017   ALKPHOS 53 03/03/2017   LIPASE 27 08/03/2017                 Electrolytes Lab Results  Component Value  Date   NA 140 08/03/2017   K 3.9 08/03/2017   CL 98 08/03/2017   CALCIUM 10.3 08/03/2017                 Neuropathy Markers Lab Results  Component Value Date   HGBA1C 6.1 (H) 08/29/2017                 Bone Pathology Markers No results found for: VD25OH, [id number], [id number], [id number], 25OHVITD1, 25OHVITD2, 25OHVITD3, TESTOFREE, TESTOSTERONE               Coagulation Parameters Lab Results  Component Value Date   PLT 262 08/03/2017                 Cardiovascular Markers Lab Results  Component Value Date   CKTOTAL 128 06/08/2012   CKMB 2.2 06/08/2012   TROPONINI < 0.02 06/08/2012   HGB 12.8 08/03/2017   HCT 37.0 08/03/2017                 CA Markers No results found for: CEA, CA125, LABCA2               Note: Lab results reviewed.  PFSH  Drug: Ms. Vialpando  reports that she does not use drugs. Alcohol:  reports that she does not drink alcohol. Tobacco:  reports that she quit smoking about 11 years ago. Her smoking use included cigarettes. She started smoking about 54 years ago. She has a 32.00 pack-year smoking history. she has never used smokeless tobacco. Medical:  has a past medical history of Anemia, Anxiety, Arthritis, Colon cancer ([place]) (6578), Complication of anesthesia, Constipation, COPD (chronic obstructive pulmonary disease) ([place]), Depression, Dysrhythmia, GERD (gastroesophageal reflux disease), H/O allergic rhinitis, Heart murmur, Hemorrhoid, Hyperlipidemia, Hypertension, Neck pain, chronic, Neuritis, Ovarian failure, Personal history of chemotherapy, Personal history of radiation therapy, PONV (postoperative nausea and vomiting), Pre-diabetes (2019), Proteinuria, Psoriasis, Sleep apnea (09/12/2016), Tachycardia, paroxysmal ([place]), and Urinary incontinence. Family: family history includes Asthma in her mother; Breast cancer in her maternal aunt; Heart attack in her mother; Liver cancer in her father.  Past Surgical History:  Procedure Laterality Date  .  ANTERIOR FUSION LUMBAR SPINE  1990   plate in back, not metal  . CARDIAC CATHETERIZATION  2009?    [facility];[place]  . CARDIAC CATHETERIZATION  05/2012   [facility]: Mild hypertension 37/12 with a mean pressure of 21 mm mercury  . COLONOSCOPY  2018  . herniated disc repair  2001  . MASS EXCISION Left 02/23/2016   Procedure: EXCISION MASS;  Surgeon: Christene Lye, MD;  Location: [facility] ORS;  Service: General;  Laterality: Left;  . NECK SURGERY    . PORT-A-CATH REMOVAL  2018  . PORTACATH PLACEMENT Left 03/12/2016   Procedure: INSERTION PORT-A-CATH;  Surgeon: Christene Lye, MD;  Location: [facility] ORS;  Service: General;  Laterality: Left;  . tonsillectomy  1959   history  . TUBAL LIGATION     s/p   Active Ambulatory Problems    Diagnosis Date Noted  . Tachycardia, paroxysmal ([place])   . Benign hypertension 01/16/2015  . Chronic cervical pain 01/16/2015  . CN (constipation) 01/16/2015  . Gastric reflux 01/16/2015  . Hypertriglyceridemia 01/16/2015  . Edema leg 01/16/2015  . Chronic recurrent major depressive disorder ([place]) 01/16/2015  . Dysmetabolic syndrome 46/96/2952  . Obstructive apnea 01/16/2015  . Psoriasis 01/16/2015  . Allergic rhinitis 01/16/2015  . Bursitis, trochanteric 01/16/2015  . GAD (generalized anxiety disorder) 01/16/2015  . History of fusion of cervical spine 04/02/2015  . Chronic constipation 10/16/2015  . Non-thrombocytopenic purpura ([place]) 01/12/2016  . Metastatic squamous cell carcinoma ([place]) 02/25/2016  . 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 Abnormal Pap smear of cervix 04/14/2016  . Centrilobular emphysema (  [place]) 07/14/2016  . Chronic respiratory failure with hypoxia, on home oxygen therapy ([place]) 07/28/2016  . Gallstones 12/22/2016  . Fatty liver 12/22/2016  . Abdominal pain, right upper quadrant 08/31/2017  . Gallbladder polyp 08/31/2017   Resolved Ambulatory Problems    Diagnosis Date Noted  . Urge incontinence 01/16/2015   Past Medical  History:  Diagnosis Date  . Anemia   . Anxiety   . Arthritis   . Colon cancer ([place]) 2017  . Complication of anesthesia   . Constipation   . COPD (chronic obstructive pulmonary disease) ([place])   . Depression   . Dysrhythmia   . GERD (gastroesophageal reflux disease)   . H/O allergic rhinitis   . Heart murmur   . Hemorrhoid   . Hyperlipidemia   . Hypertension   . Neck pain, chronic   . Neuritis   . Ovarian failure   . Personal history of chemotherapy   . Personal history of radiation therapy   . PONV (postoperative nausea and vomiting)   . Pre-diabetes 2019  . Proteinuria   . Psoriasis   . Sleep apnea 09/12/2016  . Tachycardia, paroxysmal ([place])   . Urinary incontinence    Constitutional Exam  General appearance: Well nourished, well developed, and well hydrated. In no apparent acute distress Vitals:   09/07/17 1043  BP: 133/82  Pulse: 85  Resp: 16  Temp: 97.6 F (36.4 C)  SpO2: 96%  Weight: 180 lb (81.6 kg)  Height: '5\' 4"'  (1.626 m)   BMI Assessment: Estimated body mass index is 30.9 kg/m as calculated from the following:   Height as of this encounter: '5\' 4"'  (1.626 m).   Weight as of this encounter: 180 lb (81.6 kg).  BMI interpretation table: BMI level Category Range association with higher incidence of chronic pain  <18 kg/m2 Underweight   18.5-24.9 kg/m2 Ideal body weight   25-29.9 kg/m2 Overweight Increased incidence by 20%  30-34.9 kg/m2 Obese (Class I) Increased incidence by 68%  35-39.9 kg/m2 Severe obesity (Class II) Increased incidence by 136%  >40 kg/m2 Extreme obesity (Class III) Increased incidence by 254%   BMI Readings from Last 4 Encounters:  09/07/17 30.90 kg/m  09/02/17 30.90 kg/m  08/30/17 28.82 kg/m  08/29/17 31.24 kg/m   Wt Readings from Last 4 Encounters:  09/07/17 180 lb (81.6 kg)  09/02/17 180 lb (81.6 kg)  08/30/17 184 lb (83.5 kg)  08/29/17 182 lb (82.6 kg)  Psych/Mental status: Alert, oriented x 3 (person, place, & time)        Eyes: PERLA Respiratory: No evidence of acute respiratory distress on O2  Cervical Spine Area Exam  Skin & Axial Inspection: No masses, redness, edema, swelling, or associated skin lesions Alignment: Symmetrical Functional ROM: Decreased ROM, bilaterally, R>L Stability: No instability detected Muscle Tone/Strength: Functionally intact. No obvious neuro-muscular anomalies detected. Sensory (Neurological): Arthropathic arthralgia and dermatomal pain pattern Palpation: Complains of area being tender to palpation Positive provocative maneuver for for cervical facet disease Positive Spurling's on the left Upper Extremity (UE) Exam    Side: Right upper extremity  Side: Left upper extremity  Skin & Extremity Inspection: Skin color, temperature, and hair growth are WNL. No peripheral edema or cyanosis. No masses, redness, swelling, asymmetry, or associated skin lesions. No contractures.  Skin & Extremity Inspection: Skin color, temperature, and hair growth are WNL. No peripheral edema or cyanosis. No masses, redness, swelling, asymmetry, or associated skin lesions. No contractures.  Functional ROM: Unrestricted ROM  Functional ROM: Unrestricted ROM          Muscle Tone/Strength: Functionally intact. No obvious neuro-muscular anomalies detected.  Muscle Tone/Strength: Functionally intact. No obvious neuro-muscular anomalies detected.  Sensory (Neurological): Unimpaired          Sensory (Neurological): Unimpaired          Palpation: No palpable anomalies              Palpation: No palpable anomalies              Specialized Test(s): Deferred         Specialized Test(s): Deferred          Thoracic Spine Area Exam  Skin & Axial Inspection: No masses, redness, or swelling Alignment: Symmetrical Functional ROM: Unrestricted ROM Stability: No instability detected Muscle Tone/Strength: Functionally intact. No obvious neuro-muscular anomalies detected. Sensory (Neurological): Unimpaired Muscle  strength & Tone: No palpable anomalies  Lumbar Spine Area Exam  Skin & Axial Inspection: No masses, redness, or swelling Alignment: Symmetrical Functional ROM: Unrestricted ROM      Stability: No instability detected Muscle Tone/Strength: Functionally intact. No obvious neuro-muscular anomalies detected. Sensory (Neurological): Unimpaired Palpation: No palpable anomalies       Provocative Tests: Lumbar Hyperextension and rotation test: evaluation deferred today       Lumbar Lateral bending test: evaluation deferred today       Patrick's Maneuver: evaluation deferred today                    Gait & Posture Assessment  Ambulation: Unassisted Gait: Relatively normal for age and body habitus Posture: WNL   Lower Extremity Exam    Side: Right lower extremity  Side: Left lower extremity  Skin & Extremity Inspection: Skin color, temperature, and hair growth are WNL. No peripheral edema or cyanosis. No masses, redness, swelling, asymmetry, or associated skin lesions. No contractures.  Skin & Extremity Inspection: Skin color, temperature, and hair growth are WNL. No peripheral edema or cyanosis. No masses, redness, swelling, asymmetry, or associated skin lesions. No contractures.  Functional ROM: Unrestricted ROM          Functional ROM: Unrestricted ROM          Muscle Tone/Strength: Functionally intact. No obvious neuro-muscular anomalies detected.  Muscle Tone/Strength: Functionally intact. No obvious neuro-muscular anomalies detected.  Sensory (Neurological): Unimpaired  Sensory (Neurological): Unimpaired  Palpation: No palpable anomalies  Palpation: No palpable anomalies   Assessment  Primary Diagnosis & Pertinent Problem List: The primary encounter diagnosis was Cervical facet joint syndrome. Diagnoses of Chronic cervical pain, Osteoarthritis of spine with radiculopathy, cervical region, Cervicalgia, and Chronic pain syndrome were also pertinent to this visit.  Visit Diagnosis (New  problems to examiner): 1. Cervical facet joint syndrome   2. Chronic cervical pain   3. Osteoarthritis of spine with radiculopathy, cervical region   4. Cervicalgia   5. Chronic pain syndrome    General Recommendations: The pain condition that the patient suffers from is best treated with a multidisciplinary approach that involves an increase in physical activity to prevent de-conditioning and worsening of the pain cycle, as well as psychological counseling (formal and/or informal) to address the co-morbid psychological affects of pain. Treatment will often involve judicious use of pain medications and interventional procedures to decrease the pain, allowing the patient to participate in the physical activity that will ultimately produce long-lasting pain reductions. The goal of the multidisciplinary approach is to return the patient to a higher level of  overall function and to restore their ability to perform activities of daily living.  62 year old female who presents with a chief complaint of neck pain and shoulder pain that radiates down into her bilateral arms, left greater than right.  Patient has a history of 2 cervical spine surgeries, ACDF from C4-C7.  Last of which was approximately 9 years ago.  Patient states that the pain is similar to what it was prior to her last surgery.  She had an updated MRI that was done at [facility] in December 2018 that is listed under care everywhere.  Interestingly, even though the patient has worsening clinical symptoms, her MRI from 2018 was largely unremarkable except for C2/3 facet arthropathy.  Her MRI from 2017 showed greater pathology even though her symptoms have worsened since then. Patient states that her neck pain also radiates down her thoracic spine.  She has been on chronic opioid therapy which consists of hydrocodone 5 mg twice daily as needed, quantity 60 a month.  MME equals 10.  She states that this medication has become less effective.  The patient also  takes tizanidine as needed for muscle spasms and sleep.  Patient is on gabapentin 300 mill grams twice daily as well as Effexor 150 mg daily.  Patient denies alcohol, drug, substance abuse.  While the patient's imaging does not show any significant neural foraminal stenosis or canal stenosis and only mild degenerative disease, the patient does have clinical symptoms and physical exam findings that are consistent with cervical facet disease and cervical spondylosis.  We discussed diagnostic cervical facet medial branch nerve blocks at C4, 5, 6, 7 bilaterally to see if it helps with her axial neck and shoulder pain.  In regards to her cervical radicular symptoms that she describes as burning and tingling extending down her left arm worsen with cervical extension and right lateral neck rotation we discussed performing an epidural steroid injection the cervical region.  Regards to medication management, instructed the patient to increase her gabapentin to 300 mg 3 times daily for her neuropathic and radicular symptoms.  I will also obtain a urine drug screen today and consideration of taken on this patient for opioid management.  I have instructed the patient to continue tizanidine, Effexor, Norco as prescribed.  Patient is tried physical therapy in the past which was not effective.  Plan: -UDS today, should be positive for Norco -Increase gabapentin to 300 mg 3 times daily.  Prescription provided -Continue Effexor, Norco, tizanidine as prescribed.  -schedule for bilateral cervical facet medial branch nerve blocks at C4,5,6,7 bilaterally   Future: C-ESI for cervical radiculopathy  Problem-specific plan: No problem-specific Assessment & Plan notes found for this encounter.  Ordered Lab-work, Procedure(s), Referral(s), & Consult(s): Orders Placed This Encounter  Procedures  . CERVICAL FACET (MEDIAL BRANCH NERVE BLOCK)   . Compliance Drug Analysis, Ur   Pharmacotherapy (current): Medications  ordered:  Meds ordered this encounter  Medications  . gabapentin (NEURONTIN) 300 MG capsule    Sig: Take 1 capsule (300 mg total) by mouth 3 (three) times daily.    Dispense:  90 capsule    Refill:  3   Medications administered during this visit: Tamala Ser. Bradfield had no medications administered during this visit.   Pharmacological management options:  Opioid Analgesics: The patient was informed that there is no guarantee that she would be a candidate for opioid analgesics. The decision will be made following CDC guidelines. This decision will be based on the results of diagnostic studies, as  well as Ms. Yehle's risk profile.   Membrane stabilizer: To be determined at a later time  Muscle relaxant: To be determined at a later time  NSAID: To be determined at a later time  Other analgesic(s): To be determined at a later time   Interventional management options: Ms. Ohmann was informed that there is no guarantee that she would be a candidate for interventional therapies. The decision will be based on the results of diagnostic studies, as well as Ms. Ingram's risk profile.  Procedure(s) under consideration:  -cervical ESI   Provider-requested follow-up: Return for Procedure, Medication Management.  Future Appointments  Date Time Provider [place]  09/07/2017  2:00 PM Gillis Santa, MD [facility] None  09/21/2017  9:45 AM Gillis Santa, MD [facility] None  10/17/2017  9:45 AM [facility] None  11/23/2017  2:20 PM Steele Sizer, MD [facility]  05/11/2018  3:00 PM Rubie Maid, MD [facility] None    Primary Care Physician: Steele Sizer, MD Location: [facility] Note by: Gillis Santa, M.D, Date: 09/07/2017; Time: 1:50 PM  Patient Instructions  1. UDS today 2. Increase Gabapentin 300 mg three times a day 3. Schedule for bilateral C4, 5,6,7 medial branch blocks with sedation   Facet Joint Block The facet joints connect the  bones of the spine (vertebrae). They make it possible for you to bend, twist, and make other movements with your spine. They also keep you from bending too far, twisting too far, and making other excessive movements. A facet joint block is a procedure where a numbing medicine (anesthetic) is injected into a facet joint. Often, a type of anti-inflammatory medicine called a steroid is also injected. A facet joint block may be done to diagnose neck or back pain. If the pain gets better after a facet joint block, it means the pain is probably coming from the facet joint. If the pain does not get better, it means the pain is probably not coming from the facet joint. A facet joint block may also be done to relieve neck or back pain caused by an inflamed facet joint. A facet joint block is only done to relieve pain if the pain does not improve with other methods, such as medicine, exercise programs, and physical therapy. Tell a health care provider about:  Any allergies you have.  All medicines you are taking, including vitamins, herbs, eye drops, creams, and over-the-counter medicines.  Any problems you or family members have had with anesthetic medicines.  Any blood disorders you have.  Any surgeries you have had.  Any medical conditions you have.  Whether you are pregnant or may be pregnant. What are the risks? Generally, this is a safe procedure. However, problems may occur, including:  Bleeding.  Injury to a nerve near the injection site.  Pain at the injection site.  Weakness or numbness in areas controlled by nerves near the injection site.  Infection.  Temporary fluid retention.  Allergic reactions to medicines or dyes.  Injury to other structures or organs near the injection site.  What happens before the procedure?  Follow instructions from your health care provider about eating or drinking restrictions.  Ask your health care provider about: ? Changing or stopping your  regular medicines. This is especially important if you are taking diabetes medicines or blood thinners. ? Taking medicines such as aspirin and ibuprofen. These medicines can thin your blood. Do not take these medicines before your procedure if your health care provider  instructs you not to.  Do not take any new dietary supplements or medicines without asking your health care provider first.  Plan to have someone take you home after the procedure. What happens during the procedure?  You may need to remove your clothing and dress in an open-back gown.  The procedure will be done while you are lying on an X-ray table. You will most likely be asked to lie on your stomach, but you may be asked to lie in a different position if an injection will be made in your neck.  Machines will be used to monitor your oxygen levels, heart rate, and blood pressure.  If an injection will be made in your neck, an IV tube will be inserted into one of your veins. Fluids and medicine will flow directly into your body through the IV tube.  The area over the facet joint where the injection will be made will be cleaned with soap. The surrounding skin will be covered with clean drapes.  A numbing medicine (local anesthetic) will be applied to your skin. Your skin may sting or burn for a moment.  A video X-ray machine (fluoroscopy) will be used to locate the joint. In some cases, a CT scan may be used.  A contrast dye may be injected into the facet joint area to help locate the joint.  When the joint is located, an anesthetic will be injected into the joint through the needle.  Your health care provider will ask you whether you feel pain relief. If you do feel relief, a steroid may be injected to provide pain relief for a longer period of time. If you do not feel relief or feel only partial relief, additional injections of an anesthetic may be made in other facet joints.  The needle will be removed.  Your skin will  be cleaned.  A bandage (dressing) will be applied over each injection site. The procedure may vary among health care providers and hospitals. What happens after the procedure?  You will be observed for 15-30 minutes before being allowed to go home. This information is not intended to replace advice given to you by your health care provider. Make sure you discuss any questions you have with your health care provider. Document Released: 12/15/2006 Document Revised: 08/27/2015 Document Reviewed: 04/21/2015 Elsevier Interactive Patient Education  [employer].

## 2017-09-11 MED ORDER — CEFAZOLIN SODIUM-DEXTROSE 2-4 GM/100ML-% IV SOLN
2.0000 g | INTRAVENOUS | Status: AC
  Administered 2017-09-12: 2 g via INTRAVENOUS

## 2017-09-12 ENCOUNTER — Encounter: Payer: Self-pay | Admitting: Emergency Medicine

## 2017-09-12 ENCOUNTER — Ambulatory Visit: Payer: Medicaid Other

## 2017-09-12 ENCOUNTER — Ambulatory Visit: Payer: Medicaid Other | Admitting: Certified Registered"

## 2017-09-12 ENCOUNTER — Encounter
Admission: RE | Disposition: A | Discharge status: Home / Self Care | Payer: Self-pay | Source: Ambulatory Visit | Attending: General Surgery

## 2017-09-12 ENCOUNTER — Ambulatory Visit
Admission: RE | Admit: 2017-09-12 | Discharge: 2017-09-12 | Disposition: A | Discharge status: Home / Self Care | Payer: Medicaid Other | Source: Ambulatory Visit | Attending: General Surgery | Admitting: General Surgery

## 2017-09-12 DIAGNOSIS — K821 Hydrops of gallbladder: Secondary | ICD-10-CM

## 2017-09-12 DIAGNOSIS — Z888 Allergy status to other drugs, medicaments and biological substances status: Secondary | ICD-10-CM | POA: Insufficient documentation

## 2017-09-12 DIAGNOSIS — F329 Major depressive disorder, single episode, unspecified: Secondary | ICD-10-CM | POA: Insufficient documentation

## 2017-09-12 DIAGNOSIS — K219 Gastro-esophageal reflux disease without esophagitis: Secondary | ICD-10-CM | POA: Diagnosis not present

## 2017-09-12 DIAGNOSIS — Z87891 Personal history of nicotine dependence: Secondary | ICD-10-CM | POA: Diagnosis not present

## 2017-09-12 DIAGNOSIS — K824 Cholesterolosis of gallbladder: Secondary | ICD-10-CM | POA: Diagnosis present

## 2017-09-12 DIAGNOSIS — R011 Cardiac murmur, unspecified: Secondary | ICD-10-CM | POA: Insufficient documentation

## 2017-09-12 DIAGNOSIS — I10 Essential (primary) hypertension: Secondary | ICD-10-CM | POA: Insufficient documentation

## 2017-09-12 DIAGNOSIS — G4730 Sleep apnea, unspecified: Secondary | ICD-10-CM | POA: Insufficient documentation

## 2017-09-12 DIAGNOSIS — F419 Anxiety disorder, unspecified: Secondary | ICD-10-CM | POA: Diagnosis not present

## 2017-09-12 DIAGNOSIS — Z79899 Other long term (current) drug therapy: Secondary | ICD-10-CM | POA: Insufficient documentation

## 2017-09-12 DIAGNOSIS — K7581 Nonalcoholic steatohepatitis (NASH): Secondary | ICD-10-CM | POA: Diagnosis not present

## 2017-09-12 DIAGNOSIS — K811 Chronic cholecystitis: Secondary | ICD-10-CM | POA: Insufficient documentation

## 2017-09-12 DIAGNOSIS — Z882 Allergy status to sulfonamides status: Secondary | ICD-10-CM | POA: Insufficient documentation

## 2017-09-12 DIAGNOSIS — J449 Chronic obstructive pulmonary disease, unspecified: Secondary | ICD-10-CM | POA: Diagnosis not present

## 2017-09-12 DIAGNOSIS — Z419 Encounter for procedure for purposes other than remedying health state, unspecified: Secondary | ICD-10-CM

## 2017-09-12 DIAGNOSIS — R945 Abnormal results of liver function studies: Secondary | ICD-10-CM

## 2017-09-12 HISTORY — PX: CHOLECYSTECTOMY: SHX55

## 2017-09-12 HISTORY — PX: LIVER BIOPSY: SHX301

## 2017-09-12 LAB — PROTIME-INR
INR: 1
Prothrombin Time: 13.1 seconds (ref 11.4–15.2)

## 2017-09-12 LAB — GLUCOSE, CAPILLARY
Glucose-Capillary: 121 mg/dL — ABNORMAL HIGH (ref 65–99)
Glucose-Capillary: 141 mg/dL — ABNORMAL HIGH (ref 65–99)

## 2017-09-12 LAB — APTT: APTT: 30 s (ref 24–36)

## 2017-09-12 SURGERY — LAPAROSCOPIC CHOLECYSTECTOMY WITH INTRAOPERATIVE CHOLANGIOGRAM
Anesthesia: General

## 2017-09-12 MED ORDER — DEXAMETHASONE SODIUM PHOSPHATE 10 MG/ML IJ SOLN
INTRAMUSCULAR | Status: DC | PRN
  Administered 2017-09-12: 4 mg via INTRAVENOUS

## 2017-09-12 MED ORDER — ROCURONIUM BROMIDE 50 MG/5ML IV SOLN
INTRAVENOUS | Status: AC
  Filled 2017-09-12: qty 1

## 2017-09-12 MED ORDER — SUCCINYLCHOLINE CHLORIDE 20 MG/ML IJ SOLN
INTRAMUSCULAR | Status: AC
  Filled 2017-09-12: qty 1

## 2017-09-12 MED ORDER — LIDOCAINE HCL (PF) 2 % IJ SOLN
INTRAMUSCULAR | Status: AC
  Filled 2017-09-12: qty 10

## 2017-09-12 MED ORDER — LIDOCAINE HCL (CARDIAC) 20 MG/ML IV SOLN
INTRAVENOUS | Status: DC | PRN
  Administered 2017-09-12: 100 mg via INTRAVENOUS

## 2017-09-12 MED ORDER — MIDAZOLAM HCL 2 MG/2ML IJ SOLN
INTRAMUSCULAR | Status: DC | PRN
  Administered 2017-09-12: 2 mg via INTRAVENOUS

## 2017-09-12 MED ORDER — SUCCINYLCHOLINE CHLORIDE 20 MG/ML IJ SOLN
INTRAMUSCULAR | Status: DC | PRN
  Administered 2017-09-12: 100 mg via INTRAVENOUS

## 2017-09-12 MED ORDER — ONDANSETRON HCL 4 MG/2ML IJ SOLN: 4.0000 mg | Freq: Once | INTRAMUSCULAR | Status: DC | PRN

## 2017-09-12 MED ORDER — ACETAMINOPHEN 10 MG/ML IV SOLN
INTRAVENOUS | Status: DC | PRN
  Administered 2017-09-12: 1000 mg via INTRAVENOUS

## 2017-09-12 MED ORDER — OXYCODONE HCL 5 MG PO TABS
5.0000 mg | ORAL_TABLET | ORAL | Status: DC | PRN
  Administered 2017-09-12: 5 mg via ORAL

## 2017-09-12 MED ORDER — FENTANYL CITRATE (PF) 100 MCG/2ML IJ SOLN
25.0000 ug | INTRAMUSCULAR | Status: AC | PRN
  Administered 2017-09-12 (×6): 25 ug via INTRAVENOUS

## 2017-09-12 MED ORDER — ACETAMINOPHEN 10 MG/ML IV SOLN
INTRAVENOUS | Status: AC
  Filled 2017-09-12: qty 100

## 2017-09-12 MED ORDER — FENTANYL CITRATE (PF) 100 MCG/2ML IJ SOLN
INTRAMUSCULAR | Status: AC
  Filled 2017-09-12: qty 2

## 2017-09-12 MED ORDER — SODIUM CHLORIDE 0.9 % IJ SOLN
INTRAMUSCULAR | Status: AC
  Filled 2017-09-12: qty 50

## 2017-09-12 MED ORDER — ROCURONIUM BROMIDE 100 MG/10ML IV SOLN
INTRAVENOUS | Status: DC | PRN
  Administered 2017-09-12 (×2): 5 mg via INTRAVENOUS
  Administered 2017-09-12: 40 mg via INTRAVENOUS
  Administered 2017-09-12: 5 mg via INTRAVENOUS

## 2017-09-12 MED ORDER — SODIUM CHLORIDE 0.9 % IV SOLN
INTRAVENOUS | Status: DC
  Administered 2017-09-12: 10:00:00 via INTRAVENOUS

## 2017-09-12 MED ORDER — FENTANYL CITRATE (PF) 100 MCG/2ML IJ SOLN
INTRAMUSCULAR | Status: DC | PRN
  Administered 2017-09-12: 50 ug via INTRAVENOUS
  Administered 2017-09-12: 100 ug via INTRAVENOUS
  Administered 2017-09-12: 50 ug via INTRAVENOUS

## 2017-09-12 MED ORDER — THROMBIN (RECOMBINANT) 5000 UNITS EX SOLR
CUTANEOUS | Status: AC
  Filled 2017-09-12: qty 5000

## 2017-09-12 MED ORDER — DEXAMETHASONE SODIUM PHOSPHATE 10 MG/ML IJ SOLN
INTRAMUSCULAR | Status: AC
  Filled 2017-09-12: qty 1

## 2017-09-12 MED ORDER — SUGAMMADEX SODIUM 200 MG/2ML IV SOLN
INTRAVENOUS | Status: AC
  Filled 2017-09-12: qty 2

## 2017-09-12 MED ORDER — ONDANSETRON HCL 4 MG/2ML IJ SOLN
INTRAMUSCULAR | Status: AC
  Filled 2017-09-12: qty 2

## 2017-09-12 MED ORDER — OXYCODONE HCL 5 MG PO TABS
ORAL_TABLET | ORAL | Status: DC
Start: 2017-09-12 — End: 2017-09-12
  Filled 2017-09-12: qty 1

## 2017-09-12 MED ORDER — MIDAZOLAM HCL 2 MG/2ML IJ SOLN
INTRAMUSCULAR | Status: AC
  Filled 2017-09-12: qty 2

## 2017-09-12 MED ORDER — THROMBIN 20000 UNITS EX KIT
PACK | CUTANEOUS | Status: AC
  Filled 2017-09-12: qty 1

## 2017-09-12 MED ORDER — PROPOFOL 10 MG/ML IV BOLUS
INTRAVENOUS | Status: AC
  Filled 2017-09-12: qty 20

## 2017-09-12 MED ORDER — SODIUM CHLORIDE 0.9 % IV SOLN
INTRAVENOUS | Status: DC | PRN
  Administered 2017-09-12: 40 mL

## 2017-09-12 MED ORDER — GELATIN ABSORBABLE MT POWD
OROMUCOSAL | Status: DC | PRN
  Administered 2017-09-12: 20 mL via TOPICAL

## 2017-09-12 MED ORDER — PROPOFOL 10 MG/ML IV BOLUS
INTRAVENOUS | Status: DC | PRN
  Administered 2017-09-12: 140 mg via INTRAVENOUS

## 2017-09-12 MED ORDER — GELATIN ABSORBABLE 12-7 MM EX MISC
CUTANEOUS | Status: AC
  Filled 2017-09-12: qty 1

## 2017-09-12 MED ORDER — PHENYLEPHRINE HCL 10 MG/ML IJ SOLN
INTRAMUSCULAR | Status: DC | PRN
  Administered 2017-09-12: 50 ug via INTRAVENOUS
  Administered 2017-09-12: 100 ug via INTRAVENOUS

## 2017-09-12 MED ORDER — OXYCODONE-ACETAMINOPHEN 5-325 MG PO TABS: 1.0000 | ORAL_TABLET | ORAL | 0 refills | Status: DC | PRN

## 2017-09-12 MED ORDER — FENTANYL CITRATE (PF) 100 MCG/2ML IJ SOLN
INTRAMUSCULAR | Status: AC
  Administered 2017-09-12: 25 ug via INTRAVENOUS
  Filled 2017-09-12: qty 2

## 2017-09-12 MED ORDER — SUGAMMADEX SODIUM 200 MG/2ML IV SOLN
INTRAVENOUS | Status: DC | PRN
  Administered 2017-09-12: 165 mg via INTRAVENOUS

## 2017-09-12 SURGICAL SUPPLY — 39 items
APPLIER CLIP ROT 10 11.4 M/L (STAPLE) ×2
BLADE SURG 11 STRL SS SAFETY (MISCELLANEOUS) ×2 IMPLANT
CANISTER SUCT 1200ML W/VALVE (MISCELLANEOUS) ×2 IMPLANT
CANNULA DILATOR 10 W/SLV (CANNULA) ×2 IMPLANT
CANNULA DILATOR 5 W/SLV (CANNULA) ×4 IMPLANT
CATH CHOLANG 76X19 KUMAR (CATHETERS) ×2 IMPLANT
CHLORAPREP W/TINT 26ML (MISCELLANEOUS) ×2 IMPLANT
CLIP APPLIE ROT 10 11.4 M/L (STAPLE) ×1 IMPLANT
CONRAY 60ML FOR OR (MISCELLANEOUS) ×2 IMPLANT
DISSECTOR KITTNER STICK (MISCELLANEOUS) IMPLANT
DISSECTORS/KITTNER STICK (MISCELLANEOUS)
DRAPE SHEET LG 3/4 BI-LAMINATE (DRAPES) ×2 IMPLANT
DRSG TEGADERM 2-3/8X2-3/4 SM (GAUZE/BANDAGES/DRESSINGS) ×8 IMPLANT
DRSG TELFA 4X3 1S NADH ST (GAUZE/BANDAGES/DRESSINGS) ×2 IMPLANT
ELECT REM PT RETURN 9FT ADLT (ELECTROSURGICAL) ×2
ELECTRODE REM PT RTRN 9FT ADLT (ELECTROSURGICAL) ×1 IMPLANT
GLOVE BIO SURGEON STRL SZ7.5 (GLOVE) ×2 IMPLANT
GLOVE INDICATOR 8.0 STRL GRN (GLOVE) ×2 IMPLANT
GOWN STRL REUS W/ TWL LRG LVL3 (GOWN DISPOSABLE) ×3 IMPLANT
GOWN STRL REUS W/TWL LRG LVL3 (GOWN DISPOSABLE) ×3
HEMOSTAT SURGICEL 2X3 (HEMOSTASIS) ×4 IMPLANT
IRRIGATION STRYKERFLOW (MISCELLANEOUS) ×1 IMPLANT
IRRIGATOR STRYKERFLOW (MISCELLANEOUS) ×2
IV LACTATED RINGERS 1000ML (IV SOLUTION) ×2 IMPLANT
KIT TURNOVER KIT A (KITS) ×2 IMPLANT
LABEL OR SOLS (LABEL) ×2 IMPLANT
NDL INSUFF ACCESS 14 VERSASTEP (NEEDLE) ×2 IMPLANT
NS IRRIG 500ML POUR BTL (IV SOLUTION) ×2 IMPLANT
PACK LAP CHOLECYSTECTOMY (MISCELLANEOUS) ×2 IMPLANT
POUCH SPECIMEN RETRIEVAL 10MM (ENDOMECHANICALS) ×2 IMPLANT
SCISSORS METZENBAUM CVD 33 (INSTRUMENTS) ×2 IMPLANT
STRIP CLOSURE SKIN 1/2X4 (GAUZE/BANDAGES/DRESSINGS) ×2 IMPLANT
SUT VIC AB 0 CT2 27 (SUTURE) ×2 IMPLANT
SUT VIC AB 4-0 FS2 27 (SUTURE) ×2 IMPLANT
SWABSTK COMLB BENZOIN TINCTURE (MISCELLANEOUS) ×2 IMPLANT
SYS BIOPSY MAX CORE 14GX10 (NEEDLE) ×2 IMPLANT
TROCAR XCEL NON-BLD 11X100MML (ENDOMECHANICALS) ×2 IMPLANT
TUBING INSUFFLATION (TUBING) ×2 IMPLANT
WATER STERILE IRR 1000ML POUR (IV SOLUTION) IMPLANT

## 2017-09-12 NOTE — Anesthesia Procedure Notes (Signed)
Procedure Name: Intubation Performed by: Woodard Perrell, CRNA Pre-anesthesia Checklist: Patient identified, Patient being monitored, Timeout performed, Emergency Drugs available and Suction available Patient Re-evaluated:Patient Re-evaluated prior to induction Oxygen Delivery Method: Circle system utilized Preoxygenation: Pre-oxygenation with 100% oxygen Induction Type: IV induction Ventilation: Mask ventilation without difficulty Laryngoscope Size: Mac and 3 Grade View: Grade II Tube type: Oral Tube size: 7.0 mm Number of attempts: 1 Airway Equipment and Method: Stylet Placement Confirmation: ETT inserted through vocal cords under direct vision,  positive ETCO2 and breath sounds checked- equal and bilateral Secured at: 21 cm Tube secured with: Tape Dental Injury: Teeth and Oropharynx as per pre-operative assessment        

## 2017-09-12 NOTE — H&P (Signed)
Patient with enlarging gallbladder polyp for cholecystectomy.  Liver biopsy requested by gastroenterology service.  Plan procedure reviewed.  Interval change since office visit of August 30, 2017.

## 2017-09-12 NOTE — Anesthesia Postprocedure Evaluation (Signed)
Anesthesia Post Note  Patient: Judy Allen  Procedure(s) Performed: LAPAROSCOPIC CHOLECYSTECTOMY WITH INTRAOPERATIVE CHOLANGIOGRAM (N/A ) LIVER BIOPSY (N/A )  Patient location during evaluation: PACU Anesthesia Type: General Level of consciousness: awake and alert Pain management: pain level controlled Vital Signs Assessment: post-procedure vital signs reviewed and stable Respiratory status: spontaneous breathing and respiratory function stable Cardiovascular status: stable Anesthetic complications: no     Last Vitals:  Vitals:   09/12/17 1154 09/12/17 1155  BP: (!) 152/88   Pulse: 93 91  Resp: 16 16  Temp: 36.8 C   SpO2: 96% 96%    Last Pain:  Vitals:   09/12/17 0929  TempSrc: Oral  PainSc: 2                  Haille Pardi K

## 2017-09-12 NOTE — Anesthesia Preprocedure Evaluation (Signed)
Anesthesia Evaluation  Patient identified by MRN, date of birth, ID band Patient awake    Reviewed: Allergy & Precautions, NPO status , Patient's Chart, lab work & pertinent test results  History of Anesthesia Complications (+) PONV  Airway Mallampati: III       Dental   Pulmonary sleep apnea (not using CPAP) , COPD,  COPD inhaler, former smoker,           Cardiovascular hypertension, Pt. on medications (-) Past MI and (-) CHF + dysrhythmias (occ palpitations, tachycardia) + Valvular Problems/Murmurs      Neuro/Psych neg Seizures Anxiety Depression    GI/Hepatic Neg liver ROS, GERD  Medicated and Controlled,  Endo/Other  neg diabetes  Renal/GU negative Renal ROS     Musculoskeletal   Abdominal   Peds  Hematology  (+) anemia ,   Anesthesia Other Findings   Reproductive/Obstetrics                             Anesthesia Physical Anesthesia Plan  ASA: III  Anesthesia Plan: General   Post-op Pain Management:    Induction: Intravenous  PONV Risk Score and Plan: 4 or greater and Dexamethasone, Ondansetron, Midazolam and Scopolamine patch - Pre-op  Airway Management Planned:   Additional Equipment:   Intra-op Plan:   Post-operative Plan:   Informed Consent: I have reviewed the patients History and Physical, chart, labs and discussed the procedure including the risks, benefits and alternatives for the proposed anesthesia with the patient or authorized representative who has indicated his/her understanding and acceptance.     Plan Discussed with:   Anesthesia Plan Comments:         Anesthesia Quick Evaluation

## 2017-09-12 NOTE — Op Note (Signed)
Preoperative diagnosis: Enlarging gallbladder polyp, abnormal liver function studies.  Postoperative diagnosis: Same, Rochele Raring syndrome  Operative procedure: Laparoscopic cholecystectomy with intraoperative cholangiograms, right lobe liver biopsy, lysis of adhesions.  Operating Surgeon: Hervey Ard, MD.  Anesthesia: General endotracheal.  Estimated blood loss: 200 cc.  Clinical note: This 62 year old woman has had long-standing right upper quadrant pain.  Ultrasound had shown a 7 mm gallbladder polyp last year which reportedly increased to 1.2 cm in size.  She has mild elevation of the serum transaminases.  She was felt to be a candidate for resection of the gallbladder polyp to minimize the risk of malignancy and a liver biopsy was requested by the GI service based on her abnormal liver function studies.  The patient received Kefzol intravenously prior to the procedure.  SCD stockings were used for DVT prevention.  Operative note: The patient under adequate general endotracheal anesthesia the abdomen was cleansed with ChloraPrep and draped.  In Trendelenburg position a varies needle was placed through a trans-umbilical incision.  After assuring intra-abdominal location with a hanging drop test the abdomen was insufflated with CO2 of 10 mmHg pressure.  A 10 mm Step port was expanded.  Inspection showed no evidence of injury from initial port placement.  The patient was placed into modest reverse Trendelenburg position and rolled to the left.  An 11 mm XL port was placed in the epigastrium and 2-5 mm Step ports placed in the right lateral abdominal wall.  The gallbladder was noted to be distended but not tense nor was it showing evidence of acute inflammatory changes.  It was partially intrahepatic.  The gallbladder was decompressed with a needle catheter.  The gallbladder was placed on cephalad traction.  The neck of the gallbladder was exposed.  The cystic artery overlay the cystic  duct and this was doubly clipped and divided.  Fluoroscopic cholangiograms were completed using one half strength Conray 60, 40 cc total, with prompt filling of the cystic duct and common bile duct.  The patient was then rolled to the right with filling of the common hepatic duct in the right hepatic duct and to a lesser extent the left hepatic duct.  There was free flow into the duodenum and smooth tapering of the distal common bile duct.  The cystic duct was doubly clipped and divided.  A branch artery on the posterior aspect of the liver capsule was controlled with hemoclips.  The gallbladder was removed from the liver bed make use of hook cautery dissection.  This was then delivered to the umbilical port site without incident.  Inspection from the epigastric site was unremarkable.  The adhesions from the surface of the liver to the anterior abdominal wall were taken down with cautery dissection.  The liver biopsy good hemostasis was noted.  Liver biopsy was completed making use of a 14-gauge Bard automated device.  This was done through the right lobe of the liver where it was moderately plump.  This was to the right of the falciform ligament.  The biopsy device was utilized twice and 2 good core samples were obtained.  Voluminous bleeding was encountered.  The next 30 minutes was spent controlling this with first the application of Surgicel, attempts at cauterizing the tissue adjacent to the biopsy entrance site and finally with use of Gelfoam and thrombin.  The abdominal cavity was irrigated with 2 L of lactated Ringer solution.  Final hemostasis appeared excellent.  The area of  the cystic duct and cystic artery appeared  hemostatic.    The abdomen was then desufflated under direct vision.  Fascia at the umbilicus was closed with a 0 Vicryl suture.  Skin incisions were closed with 4-0 Vicryl subarticular sutures.  Benzoin, Steri-Strips followed by Telfa and Tegaderm dressings were applied.  The patient  tolerated the procedure well and was taken to the recovery room in stable condition.

## 2017-09-12 NOTE — Transfer of Care (Signed)
Immediate Anesthesia Transfer of Care Note  Patient: Judy Allen  Procedure(s) Performed: LAPAROSCOPIC CHOLECYSTECTOMY WITH INTRAOPERATIVE CHOLANGIOGRAM (N/A ) LIVER BIOPSY (N/A )  Patient Location: PACU  Anesthesia Type:General  Level of Consciousness: drowsy and responds to stimulation  Airway & Oxygen Therapy: Patient Spontanous Breathing and Patient connected to face mask oxygen  Post-op Assessment: Report given to RN and Post -op Vital signs reviewed and stable  Post vital signs: Reviewed and stable  Last Vitals:  Vitals:   09/12/17 0929 09/12/17 1154  BP: 131/81 (!) 152/88  Pulse: 86 93  Resp: 17   Temp: 36.9 C   SpO2: 95% 96%    Last Pain:  Vitals:   09/12/17 0929  TempSrc: Oral  PainSc: 2          Complications: No apparent anesthesia complications

## 2017-09-12 NOTE — OR Nursing (Signed)
Discharge instructions discussed with pt and daughter. Both voice understanding. 

## 2017-09-12 NOTE — Anesthesia Post-op Follow-up Note (Signed)
Anesthesia QCDR form completed.        

## 2017-09-12 NOTE — Discharge Instructions (Signed)

## 2017-09-13 ENCOUNTER — Encounter: Payer: Self-pay | Admitting: General Surgery

## 2017-09-13 LAB — COMPLIANCE DRUG ANALYSIS, UR

## 2017-09-14 LAB — SURGICAL PATHOLOGY

## 2017-09-15 ENCOUNTER — Encounter: Payer: Self-pay | Admitting: General Surgery

## 2017-09-20 ENCOUNTER — Telehealth: Payer: Self-pay | Admitting: *Deleted

## 2017-09-20 NOTE — Telephone Encounter (Signed)
UDS results explained.

## 2017-09-21 ENCOUNTER — Ambulatory Visit: Payer: Medicaid Other | Admitting: Student in an Organized Health Care Education/Training Program

## 2017-09-22 ENCOUNTER — Telehealth: Payer: Self-pay | Admitting: Gastroenterology

## 2017-09-22 NOTE — Telephone Encounter (Signed)
Patient calling for liver biopsy results.

## 2017-09-26 NOTE — Telephone Encounter (Signed)
LVM for pt to return my call.

## 2017-09-26 NOTE — Telephone Encounter (Signed)
Let the patient know that her liver biopsy at the time of her gallbladder removal showed her to have 2 out of 3 inflammation but 1 out of 4 in damage meaning that there is no cirrhosis but lots of fatty liver inflammation.  The patient should continue to try to lose weight.

## 2017-09-27 ENCOUNTER — Encounter: Payer: Self-pay | Admitting: General Surgery

## 2017-09-27 ENCOUNTER — Ambulatory Visit (INDEPENDENT_AMBULATORY_CARE_PROVIDER_SITE_OTHER): Payer: Self-pay | Admitting: General Surgery

## 2017-09-27 ENCOUNTER — Telehealth: Payer: Self-pay | Admitting: Gastroenterology

## 2017-09-27 VITALS — BP 114/68 | HR 86 | Resp 14 | Ht 64.0 in | Wt 179.0 lb

## 2017-09-27 DIAGNOSIS — K658 Other peritonitis: Secondary | ICD-10-CM | POA: Insufficient documentation

## 2017-09-27 DIAGNOSIS — A5489 Other gonococcal infections: Secondary | ICD-10-CM

## 2017-09-27 DIAGNOSIS — R7989 Other specified abnormal findings of blood chemistry: Secondary | ICD-10-CM | POA: Insufficient documentation

## 2017-09-27 DIAGNOSIS — K824 Cholesterolosis of gallbladder: Secondary | ICD-10-CM

## 2017-09-27 DIAGNOSIS — R945 Abnormal results of liver function studies: Secondary | ICD-10-CM

## 2017-09-27 NOTE — Progress Notes (Deleted)
Patient ID: Judy Allen, female   DOB: 23-Aug-1955, 62 y.o.   MRN: [id number]  Chief Complaint  Patient presents with  . Routine Post Op    HPI Judy Allen is a 62 y.o. female.  Here today for postoperative visit, lap cholecystectomy on 09-12-17, she states she is doing well. Denies any gastrointestinal issues, bowels are moving regular.  HPI  Past Medical History:  Diagnosis Date  . Anemia    H/O  . Anxiety   . Arthritis   . Colon cancer ([place]) 2017   anal ca/chemo and rad  . Complication of anesthesia   . Constipation   . COPD (chronic obstructive pulmonary disease) (HCC)    also emphysema  . Depression   . Dysrhythmia    TACHYCARDIA-WELL CONTROLLED ON METOPROLO  . GERD (gastroesophageal reflux disease)   . H/O allergic rhinitis   . Heart murmur   . Hemorrhoid   . Hyperlipidemia   . Hypertension   . Neck pain, chronic   . Neuritis   . Ovarian failure   . Personal history of chemotherapy   . Personal history of radiation therapy   . PONV (postoperative nausea and vomiting)   . Pre-diabetes 2019   just being followed.  no meds  . Proteinuria   . Psoriasis   . Sleep apnea 09/12/2016   8cm H2O with 21pm oxygen mask: Eson size Medium. Heated humidifier for nasal dryness.  . Tachycardia, paroxysmal ([place])   . Urinary incontinence     Past Surgical History:  Procedure Laterality Date  . ANTERIOR FUSION LUMBAR SPINE  1990   plate in back, not metal  . CARDIAC CATHETERIZATION  2009?    [facility]  . CARDIAC CATHETERIZATION  05/2012   RHC: Mild hypertension 37/12 with a mean pressure of 21 mm mercury  . CHOLECYSTECTOMY N/A 09/12/2017   Procedure: LAPAROSCOPIC CHOLECYSTECTOMY WITH INTRAOPERATIVE CHOLANGIOGRAM;  Surgeon: Robert Bellow, MD;  Location: [facility] ORS;  Service: General;  Laterality: N/A;  . COLONOSCOPY  2018  . herniated disc repair  2001  . LIVER BIOPSY N/A 09/12/2017   Procedure: LIVER BIOPSY;  Surgeon: Robert Bellow, MD;  Location: [facility] ORS;   Service: General;  Laterality: N/A;  . MASS EXCISION Left 02/23/2016   Procedure: EXCISION MASS;  Surgeon: Christene Lye, MD;  Location: [facility] ORS;  Service: General;  Laterality: Left;  . NECK SURGERY    . PORT-A-CATH REMOVAL  2018  . PORTACATH PLACEMENT Left 03/12/2016   Procedure: INSERTION PORT-A-CATH;  Surgeon: Christene Lye, MD;  Location: [facility] ORS;  Service: General;  Laterality: Left;  . tonsillectomy  1959   history  . TUBAL LIGATION     s/p    Family History  Problem Relation Age of Onset  . Heart attack Mother   . Asthma Mother   . Liver cancer Father   . Breast cancer Maternal Aunt   . Bladder Cancer Neg Hx   . Kidney cancer Neg Hx     Social History Social History   Tobacco Use  . Smoking status: Former Smoker    Packs/day: 1.00    Years: 32.00    Pack years: 32.00    Types: Cigarettes    Start date: 08/10/1963    Last attempt to quit: 01/15/2006    Years since quitting: 11.7  . Smokeless tobacco: Never Used  Substance Use Topics  . Alcohol use: No    Alcohol/week: 0.0 oz  . Drug use: No  Allergies  Allergen Reactions  . Dapsone Other (See Comments)    Hypoxemia  . Sulfa Antibiotics Rash    Current Outpatient Medications  Medication Sig Dispense Refill  . albuterol (PROVENTIL HFA;VENTOLIN HFA) 108 (90 Base) MCG/ACT inhaler Inhale 2 puffs into the lungs every 6 (six) hours as needed for wheezing or shortness of breath.    . betamethasone dipropionate (DIPROLENE) 0.05 % cream Apply 1 application topically 2 (two) times daily as needed.    . [facility] B-12 1000 MCG TBCR TAKE 1 TABLET (1,000 MCG TOTAL) BY MOUTH DAILY.  11  . divalproex (DEPAKOTE) 250 MG DR tablet Take 1 tablet (250 mg total) by mouth at bedtime. 30 tablet 5  . gabapentin (NEURONTIN) 300 MG capsule Take 1 capsule (300 mg total) by mouth 3 (three) times daily. 90 capsule 3  . hydrochlorothiazide (HYDRODIURIL) 25 MG tablet Take 1 tablet (25 mg total) by mouth daily. 30 tablet 5  .  HYDROcodone-acetaminophen (NORCO/VICODIN) 5-325 MG tablet Take 1 tablet by mouth 2 (two) times daily. 60 tablet 0  . hydrocortisone 2.5 % cream Apply 1 application topically 2 (two) times daily.    Marland Kitchen ketoconazole (NIZORAL) 2 % cream Apply 1 application topically 2 (two) times daily.    . metoprolol succinate (TOPROL-XL) 50 MG 24 hr tablet Take 1 tablet (50 mg total) by mouth daily. 30 tablet 5  . omega-3 acid ethyl esters (LOVAZA) 1 g capsule Take 2 capsules (2 g total) 2 (two) times daily by mouth. 120 capsule 5  . omeprazole (PRILOSEC) 40 MG capsule Take 40 mg by mouth daily.   5  . oxybutynin (DITROPAN-XL) 10 MG 24 hr tablet Take 1 tablet (10 mg total) by mouth daily. 30 tablet 11  . OXYGEN Inhale 2 L into the lungs continuous.     Marland Kitchen tiZANidine (ZANAFLEX) 4 MG tablet TAKE 1 TABLET (4 MG TOTAL) BY MOUTH 3 (THREE) TIMES DAILY. 90 tablet 2  . venlafaxine XR (EFFEXOR-XR) 150 MG 24 hr capsule Take 1 capsule (150 mg total) by mouth daily with breakfast. 30 capsule 5  . tiotropium (SPIRIVA) 18 MCG inhalation capsule Place 18 mcg into inhaler and inhale daily.      No current facility-administered medications for this visit.     Review of Systems Review of Systems  Constitutional: Negative.   Respiratory: Negative.   Cardiovascular: Negative.     Height 5\' 4"  (1.626 m), weight 179 lb (81.2 kg), last menstrual period 03/04/2002.  Physical Exam Physical Exam  Data Reviewed ***  Assessment    ***    Plan    ***       Azelyn, Batie 09/27/2017, 2:52 PM

## 2017-09-27 NOTE — Telephone Encounter (Signed)
Pt notified of results

## 2017-09-27 NOTE — Telephone Encounter (Signed)
Pt returned my call and was advised of liver biopsy results.

## 2017-09-27 NOTE — Telephone Encounter (Signed)
Patient called in & l/m she was returning Ginger's call about results.

## 2017-09-27 NOTE — Patient Instructions (Addendum)
Return as needed.The patient is aware to call back for any questions or concerns.  

## 2017-09-27 NOTE — Telephone Encounter (Signed)
Left vm again today for pt to return my call.  

## 2017-09-27 NOTE — Progress Notes (Signed)
Patient ID: Judy Allen, female   DOB: [birth year], 62 y.o.   MRN: [id number]  Chief Complaint  Patient presents with  . Routine Post Op    HPI Judy Allen is a 62 y.o. female here today for her post op gallbladder surgery done on  09/12/2017. Patient states she is doing well.  HPI  Past Medical History:  Diagnosis Date  . Anemia    H/O  . Anxiety   . Arthritis   . Colon cancer ([place]) 2017   anal ca/chemo and rad  . Complication of anesthesia   . Constipation   . COPD (chronic obstructive pulmonary disease) (HCC)    also emphysema  . Depression   . Dysrhythmia    TACHYCARDIA-WELL CONTROLLED ON METOPROLO  . GERD (gastroesophageal reflux disease)   . H/O allergic rhinitis   . Heart murmur   . Hemorrhoid   . Hyperlipidemia   . Hypertension   . Neck pain, chronic   . Neuritis   . Ovarian failure   . Personal history of chemotherapy   . Personal history of radiation therapy   . PONV (postoperative nausea and vomiting)   . Pre-diabetes 2019   just being followed.  no meds  . Proteinuria   . Psoriasis   . Sleep apnea 09/12/2016   8cm H2O with 21pm oxygen mask: Eson size Medium. Heated humidifier for nasal dryness.  . Tachycardia, paroxysmal ([place])   . Urinary incontinence     Past Surgical History:  Procedure Laterality Date  . ANTERIOR FUSION LUMBAR SPINE  1990   plate in back, not metal  . CARDIAC CATHETERIZATION  2009?    [facility]  . CARDIAC CATHETERIZATION  05/2012   RHC: Mild hypertension 37/12 with a mean pressure of 21 mm mercury  . CHOLECYSTECTOMY N/A 09/12/2017   Procedure: LAPAROSCOPIC CHOLECYSTECTOMY WITH INTRAOPERATIVE CHOLANGIOGRAM;  Surgeon: Robert Bellow, MD;  Location: [facility] ORS;  Service: General;  Laterality: N/A;  . COLONOSCOPY  2018  . herniated disc repair  2001  . LIVER BIOPSY N/A 09/12/2017   Procedure: LIVER BIOPSY;  Surgeon: Robert Bellow, MD;  Location: [facility];  Service: General;  Laterality: N/A;  . MASS EXCISION Left 02/23/2016    Procedure: EXCISION MASS;  Surgeon: Christene Lye, MD;  Location: [facility] OR[facility];  Service: General;  Laterality: Left;  . NECK SURGERY    . PORT-A-CATH REMOVAL  2018  . PORTACATH PLACEMENT Left 03/12/2016   Procedure: INSERTION PORT-A-CATH;  Surgeon: Christene Lye, MD;  Location: [facility] ORS;  Service: General;  Laterality: Left;  . tonsillectomy  1959   history  . TUBAL LIGATION     s/p    Family History  Problem Relation Age of Onset  . Heart attack Mother   . Asthma Mother   . Liver cancer Father   . Breast cancer Maternal Aunt   . Bladder Cancer Neg Hx   . Kidney cancer Neg Hx     Social History Social History   Tobacco Use  . Smoking status: Former Smoker    Packs/day: 1.00    Years: 32.00    Pack years: 32.00    Types: Cigarettes    Start date: 08/10/1963    Last attempt to quit: 01/15/2006    Years since quitting: 11.7  . Smokeless tobacco: Never Used  Substance Use Topics  . Alcohol use: No    Alcohol/week: 0.0 oz  . Drug use: No    Allergies  Allergen Reactions  .  Dapsone Other (See Comments)    Hypoxemia  . Sulfa Antibiotics Rash    Current Outpatient Medications  Medication Sig Dispense Refill  . albuterol (PROVENTIL HFA;VENTOLIN HFA) 108 (90 Base) MCG/ACT inhaler Inhale 2 puffs into the lungs every 6 (six) hours as needed for wheezing or shortness of breath.    . betamethasone dipropionate (DIPROLENE) 0.05 % cream Apply 1 application topically 2 (two) times daily as needed.    . CVS B-12 1000 MCG TBCR TAKE 1 TABLET (1,000 MCG TOTAL) BY MOUTH DAILY.  11  . divalproex (DEPAKOTE) 250 MG DR tablet Take 1 tablet (250 mg total) by mouth at bedtime. 30 tablet 5  . gabapentin (NEURONTIN) 300 MG capsule Take 1 capsule (300 mg total) by mouth 3 (three) times daily. 90 capsule 3  . hydrochlorothiazide (HYDRODIURIL) 25 MG tablet Take 1 tablet (25 mg total) by mouth daily. 30 tablet 5  . HYDROcodone-acetaminophen (NORCO/VICODIN) 5-325 MG tablet Take 1 tablet  by mouth 2 (two) times daily. 60 tablet 0  . hydrocortisone 2.5 % cream Apply 1 application topically 2 (two) times daily.    Marland Kitchen ketoconazole (NIZORAL) 2 % cream Apply 1 application topically 2 (two) times daily.    . metoprolol succinate (TOPROL-XL) 50 MG 24 hr tablet Take 1 tablet (50 mg total) by mouth daily. 30 tablet 5  . omega-3 acid ethyl esters (LOVAZA) 1 g capsule Take 2 capsules (2 g total) 2 (two) times daily by mouth. 120 capsule 5  . omeprazole (PRILOSEC) 40 MG capsule Take 40 mg by mouth daily.   5  . oxybutynin (DITROPAN-XL) 10 MG 24 hr tablet Take 1 tablet (10 mg total) by mouth daily. 30 tablet 11  . OXYGEN Inhale 2 L into the lungs continuous.     Marland Kitchen tiZANidine (ZANAFLEX) 4 MG tablet TAKE 1 TABLET (4 MG TOTAL) BY MOUTH 3 (THREE) TIMES DAILY. 90 tablet 2  . venlafaxine XR (EFFEXOR-XR) 150 MG 24 hr capsule Take 1 capsule (150 mg total) by mouth daily with breakfast. 30 capsule 5  . tiotropium (SPIRIVA) 18 MCG inhalation capsule Place 18 mcg into inhaler and inhale daily.      No current facility-administered medications for this visit.     Review of Systems Review of Systems  Constitutional: Negative.   Respiratory: Negative.   Cardiovascular: Negative.     Blood pressure 114/68, pulse 86, resp. rate 14, height 5' 4"  (1.626 m), weight 179 lb (81.2 kg), last menstrual period 03/04/2002.  Physical Exam Physical Exam  Constitutional: She is oriented to person, place, and time. She appears well-developed and well-nourished.  Cardiovascular: Normal rate, regular rhythm and normal heart sounds.  Pulmonary/Chest: Effort normal and breath sounds normal.  Abdominal: Soft. Normal appearance and bowel sounds are normal. There is no tenderness.  Port sites are clean and well healed.   Neurological: She is alert and oriented to person, place, and time.  Skin: Skin is warm and dry.    Data Reviewed DIAGNOSIS:  A. GALLBLADDER; CHOLECYSTECTOMY:  - CHRONIC CHOLECYSTITIS.  -  NEGATIVE FOR MALIGNANCY.   B. LIVER, RIGHT LOBE; NEEDLE BIOPSY:  - MODERATE STEATOHEPATITIS, GRADE 2 (OF 3), STAGE 1 (BRUNT  CLASSIFICATION).   Comment:  In specimen A, a polyp is not identified on gross or microscopic  examination. These findings were discussed with Dr. Bary Castilla on 09/14/2017.   Specimen B: EHR is reviewed. Patient has a recent progressive elevation  of AST and ALT. AST/ALT have been mildly elevated over the past  couple  years with occasional return to normal. In 2017 ANA was weakly positive.  Total protein levels have been normal / low.  Sections demonstrate intact cores of hepatic parenchyma with adequate  numbers of portal tracts available for interpretation.  Macrovesicular steatosis involves approximately 50% of the parenchyma  and predominantly involves zones 1 and 2. Areas of hepatocyte injury  are noted in areas of steatosis. The trichrome stain highlights focal  pericellular fibrosis in areas of steatohepatitis, consistent with stage  1 fibrosis. Fibrous portal expansion is not present.  The majority of the portal tracts contain a mild to focally moderate  lymphocytic infiltrate with occasional plasma cells and rare  eosinophils. Minimal interface hepatitis / piecemeal necrosis is  identified. Mild bile ductular reaction is present. Bile ducts are  intact. Cholestasis is not identified.  PASD highlights ceroid containing material in sinusoidal Kupffer cells  and portal histiocytes consistent with ongoing hepatitic process.  Intrahepatocyte PASD globules are not identified. There is no stainable  iron. Stain controls worked appropriately.  Overall, the findings are consistent with moderate steatohepatitis.  Features of autoimmune hepatitis are not identified in this biopsy.  Recommend viral hepatitis serologies if they have not already been  completed.   Assessment    Good recovery after recent cholecystectomy.  Resolution of right upper quadrant pain,  likely secondary to adhesions between the anterior abdominal wall and the liver capsule secondary to [employer] syndrome.  No lasting effect from 200 cc blood loss related to her liver biopsy.  Patient was apprised that the pathology failed to show a polyp as suggested on ultrasound exams.    Plan  Patient to return as needed. The patient is aware to call back for any questions or concerns.  Liver biopsy results have been forwarded to the GI department.  HPI, Physical Exam, Assessment and Plan have been scribed under the direction and in the presence of Robert Bellow, MD. Karie Fetch, RN  I have completed the exam and reviewed the above documentation for accuracy and completeness.  I agree with the above.  Haematologist has been used and any errors in dictation or transcription are unintentional.  Hervey Ard, M.D., F.A.C.S.  Forest Gleason Byrnett 09/27/2017, 8:59 PM

## 2017-09-28 ENCOUNTER — Encounter: Payer: Self-pay | Admitting: Student in an Organized Health Care Education/Training Program

## 2017-09-28 ENCOUNTER — Ambulatory Visit
Admission: RE | Admit: 2017-09-28 | Discharge: 2017-09-28 | Disposition: A | Discharge status: Home / Self Care | Payer: Medicaid Other | Source: Ambulatory Visit | Attending: Student in an Organized Health Care Education/Training Program | Admitting: Student in an Organized Health Care Education/Training Program

## 2017-09-28 ENCOUNTER — Ambulatory Visit (HOSPITAL_BASED_OUTPATIENT_CLINIC_OR_DEPARTMENT_OTHER): Payer: Medicaid Other | Admitting: Student in an Organized Health Care Education/Training Program

## 2017-09-28 ENCOUNTER — Other Ambulatory Visit: Payer: Self-pay

## 2017-09-28 VITALS — BP 141/94 | HR 96 | Temp 97.7°F | Resp 22 | Ht 64.0 in | Wt 179.0 lb

## 2017-09-28 DIAGNOSIS — Z882 Allergy status to sulfonamides status: Secondary | ICD-10-CM | POA: Diagnosis not present

## 2017-09-28 DIAGNOSIS — G894 Chronic pain syndrome: Secondary | ICD-10-CM | POA: Diagnosis not present

## 2017-09-28 DIAGNOSIS — Z9851 Tubal ligation status: Secondary | ICD-10-CM | POA: Insufficient documentation

## 2017-09-28 DIAGNOSIS — G8929 Other chronic pain: Secondary | ICD-10-CM

## 2017-09-28 DIAGNOSIS — M542 Cervicalgia: Secondary | ICD-10-CM

## 2017-09-28 DIAGNOSIS — M79602 Pain in left arm: Secondary | ICD-10-CM | POA: Insufficient documentation

## 2017-09-28 DIAGNOSIS — Z9889 Other specified postprocedural states: Secondary | ICD-10-CM | POA: Diagnosis not present

## 2017-09-28 DIAGNOSIS — M47812 Spondylosis without myelopathy or radiculopathy, cervical region: Secondary | ICD-10-CM

## 2017-09-28 MED ORDER — LIDOCAINE HCL (PF) 1 % IJ SOLN
10.0000 mL | Freq: Once | INTRAMUSCULAR | Status: AC
  Administered 2017-09-28: 5 mL
  Filled 2017-09-28: qty 10

## 2017-09-28 MED ORDER — FENTANYL CITRATE (PF) 100 MCG/2ML IJ SOLN
25.0000 ug | INTRAMUSCULAR | Status: DC | PRN
  Administered 2017-09-28: 50 ug via INTRAVENOUS
  Filled 2017-09-28: qty 2

## 2017-09-28 MED ORDER — LACTATED RINGERS IV SOLN
1000.0000 mL | Freq: Once | INTRAVENOUS | Status: AC
  Administered 2017-09-28: 1000 mL via INTRAVENOUS

## 2017-09-28 MED ORDER — ROPIVACAINE HCL 2 MG/ML IJ SOLN
10.0000 mL | Freq: Once | INTRAMUSCULAR | Status: AC
  Administered 2017-09-28: 10 mL
  Filled 2017-09-28: qty 10

## 2017-09-28 MED ORDER — DEXAMETHASONE SODIUM PHOSPHATE 10 MG/ML IJ SOLN
10.0000 mg | Freq: Once | INTRAMUSCULAR | Status: AC
  Administered 2017-09-28: 10 mg
  Filled 2017-09-28: qty 1

## 2017-09-28 NOTE — Progress Notes (Signed)
C4 C5-C6-C7 patient's Name: Judy Allen  MRN: [id number]  Referring Provider: Steele Sizer, MD  DOB: [birth year]  PCP: Steele Sizer, MD  DOS: 09/28/2017  Note by: Gillis Santa, MD  Service setting: Ambulatory outpatient  Specialty: Interventional Pain Management  Patient type: Established  Location: [facility] (AMB) Pain Management Facility  Visit type: Interventional Procedure   Primary Reason for Visit: Interventional Pain Management Treatment. CC: Neck Pain and Arm Pain (left, tingles)  Procedure:  Anesthesia, Analgesia, Anxiolysis:  Type: Diagnostic Cervical Facet Medial Branch Block(s) #1  Region: Posterolateral cervical spine region Level:  C4, C5, C6, & C7 Medial Branch Level(s) Laterality: Bilateral Paraspinal  Type: Local Anesthesia with Moderate (Conscious) Sedation Local Anesthetic: Lidocaine 1% Route: Intravenous (IV) IV Access: Secured Sedation: Meaningful verbal contact was maintained at all times during the procedure  Indication(s): Analgesia and Anxiety   Indications: 1. Cervical facet joint syndrome   2. Chronic cervical pain   3. Chronic pain syndrome    Pain Score: Pre-procedure: 6 /10 Post-procedure: 0-No pain/10  Pre-op Assessment:  Judy Allen is a 62 y.o. (year old), female patient, seen today for interventional treatment. She  has a past surgical history that includes Tubal ligation; tonsillectomy (8938); herniated disc repair (2001); Anterior fusion lumbar spine (1990); Neck surgery; Mass excision (Left, 02/23/2016); Colonoscopy (2018); Portacath placement (Left, 03/12/2016); Cardiac catheterization (2009? ); Cardiac catheterization (05/2012); Port-a-cath removal (2018); Liver biopsy (N/A, 09/12/2017); and Cholecystectomy (N/A, 09/12/2017). Judy Allen has a current medication list which includes the following prescription(s): albuterol, betamethasone dipropionate, cvs b-12, divalproex, gabapentin, hydrochlorothiazide, hydrocodone-acetaminophen, hydrocortisone,  ketoconazole, metoprolol succinate, omega-3 acid ethyl esters, omeprazole, oxybutynin, oxygen-helium, tizanidine, venlafaxine xr, and tiotropium, and the following Facility-Administered Medications: fentanyl. Her primarily concern today is the Neck Pain and Arm Pain (left, tingles)  Initial Vital Signs:  Pulse Rate: 96 Temp: 98.6 F (37 C) Resp: 16 BP: 136/86 SpO2: 98 %(on 2L O2)  BMI: Estimated body mass index is 30.73 kg/m as calculated from the following:   Height as of this encounter: 5\' 4"  (1.626 m).   Weight as of this encounter: 179 lb (81.2 kg).  Risk Assessment: Allergies: Reviewed. She is allergic to dapsone and sulfa antibiotics.  Allergy Precautions: None required Coagulopathies: Reviewed. None identified.  Blood-thinner therapy: None at this time Active Infection(s): Reviewed. None identified. Judy Allen is afebrile  Site Confirmation: Judy Allen was asked to confirm the procedure and laterality before marking the site Procedure checklist: Completed Consent: Before the procedure and under the influence of no sedative(s), amnesic(s), or anxiolytics, the patient was informed of the treatment options, risks and possible complications. To fulfill our ethical and legal obligations, as recommended by the American Medical Association's Code of Ethics, I have informed the patient of my clinical impression; the nature and purpose of the treatment or procedure; the risks, benefits, and possible complications of the intervention; the alternatives, including doing nothing; the risk(s) and benefit(s) of the alternative treatment(s) or procedure(s); and the risk(s) and benefit(s) of doing nothing. The patient was provided information about the general risks and possible complications associated with the procedure. These may include, but are not limited to: failure to achieve desired goals, infection, bleeding, organ or nerve damage, allergic reactions, paralysis, and death. In addition, the  patient was informed of those risks and complications associated to Spine-related procedures, such as failure to decrease pain; infection (i.e.: Meningitis, epidural or intraspinal abscess); bleeding (i.e.: epidural hematoma, subarachnoid hemorrhage, or any other type of intraspinal or peri-dural bleeding); organ or  nerve damage (i.e.: Any type of peripheral nerve, nerve root, or spinal cord injury) with subsequent damage to sensory, motor, and/or autonomic systems, resulting in permanent pain, numbness, and/or weakness of one or several areas of the body; allergic reactions; (i.e.: anaphylactic reaction); and/or death. Furthermore, the patient was informed of those risks and complications associated with the medications. These include, but are not limited to: allergic reactions (i.e.: anaphylactic or anaphylactoid reaction(s)); adrenal axis suppression; blood sugar elevation that in diabetics may result in ketoacidosis or comma; water retention that in patients with history of congestive heart failure may result in shortness of breath, pulmonary edema, and decompensation with resultant heart failure; weight gain; swelling or edema; medication-induced neural toxicity; particulate matter embolism and blood vessel occlusion with resultant organ, and/or nervous system infarction; and/or aseptic necrosis of one or more joints. Finally, the patient was informed that Medicine is not an exact science; therefore, there is also the possibility of unforeseen or unpredictable risks and/or possible complications that may result in a catastrophic outcome. The patient indicated having understood very clearly. We have given the patient no guarantees and we have made no promises. Enough time was given to the patient to ask questions, all of which were answered to the patient's satisfaction. Judy Allen has indicated that she wanted to continue with the procedure. Attestation: I, the ordering provider, attest that I have discussed  with the patient the benefits, risks, side-effects, alternatives, likelihood of achieving goals, and potential problems during recovery for the procedure that I have provided informed consent. Date  Time: 09/28/2017 11:19 AM  Pre-Procedure Preparation:  Monitoring: As per clinic protocol. Respiration, ETCO2, SpO2, BP, heart rate and rhythm monitor placed and checked for adequate function Safety Precautions: Patient was assessed for positional comfort and pressure points before starting the procedure. Time-out: I initiated and conducted the "Time-out" before starting the procedure, as per protocol. The patient was asked to participate by confirming the accuracy of the "Time Out" information. Verification of the correct person, site, and procedure were performed and confirmed by me, the nursing staff, and the patient. "Time-out" conducted as per Joint Commission's Universal Protocol (UP.01.01.01). Time: 1150  Description of Procedure Process:   Position: Prone with head of the table was raised to facilitate breathing. Target Area: For Cervical Facet blocks, the target is the postero-lateral waist of the articular pillars at the C4, C5, C6, & C7 levels. Approach: Posterior approach. Area Prepped: Entire Posterior Cervico-thoracic Region Prepping solution: ChloraPrep (2% chlorhexidine gluconate and 70% isopropyl alcohol) Safety Precautions: Aspiration looking for blood return was conducted prior to all injections. At no point did we inject any substances, as a needle was being advanced. No attempts were made at seeking any paresthesias. Safe injection practices and needle disposal techniques used. Medications properly checked for expiration dates. SDV (single dose vial) medications used. Description of the Procedure: Protocol guidelines were followed. The patient was placed in position over the fluoroscopy table. The target area was identified and the area prepped in the usual manner. Skin desensitized  using vapocoolant spray. Skin & deeper tissues infiltrated with local anesthetic. Appropriate amount of time allowed to pass for local anesthetics to take effect. The procedure needle was introduced through the skin, ipsilateral to the reported pain, and advanced to the target area. Bone was contacted on the posterior aspect of the articular pillars and the needle walked lateral, until the border was cleared. Lateral views taken to make sure the needle tip did not advance past the posterior third  of the lateral mass of the posterior columns. The procedure was repeated in identical fashion for each level. Negative aspiration confirmed. Solution injected in intermittent fashion, asking for systemic symptoms every 0.5cc of injectate. The needles were then removed and the area cleansed, making sure to leave some of the prepping solution back to take advantage of its long term bactericidal properties. Vitals:   09/28/17 1218 09/28/17 1227 09/28/17 1237 09/28/17 1248  BP: (!) 143/92 (!) 143/90 (!) 141/88 (!) 141/94  Pulse:      Resp: (!) 21 (!) 25 (!) 37 (!) 22  Temp:  97.6 F (36.4 C)  97.7 F (36.5 C)  SpO2: 93% 94% 93% 93%  Weight:      Height:        Start Time: 1151 hrs. End Time: 1218 hrs. Materials:  Needle(s) Type: Regular needle Gauge: 25G Length: 3.5-in Medication(s): Please see orders for medications and dosing details. 10 cc solution made of 9 cc of Versed ropivacaine, 1 cc of Decadron 10 mg/cc.  0.5-1 cc injected at each level. Imaging Guidance (Spinal):  Type of Imaging Technique: Fluoroscopy Guidance (Spinal) Indication(s): Assistance in needle guidance and placement for procedures requiring needle placement in or near specific anatomical locations not easily accessible without such assistance. Exposure Time: Please see nurses notes. Contrast: None used. Fluoroscopic Guidance: I was personally present during the use of fluoroscopy. "Tunnel Vision Technique" used to obtain the best  possible view of the target area. Parallax error corrected before commencing the procedure. "Direction-depth-direction" technique used to introduce the needle under continuous pulsed fluoroscopy. Once target was reached, antero-posterior, oblique, and lateral fluoroscopic projection used confirm needle placement in all planes. Images permanently stored in EMR. Interpretation: No contrast injected. I personally interpreted the imaging intraoperatively. Adequate needle placement confirmed in multiple planes. Permanent images saved into the patient's record.  Antibiotic Prophylaxis:   Anti-infectives (From admission, onward)   None     Indication(s): None identified  Post-operative Assessment:  Post-procedure Vital Signs:  Pulse Rate: 96 Temp: 97.7 F (36.5 C) Resp: (!) 22 BP: (!) 141/94 SpO2: 93 %  EBL: None  Complications: No immediate post-treatment complications observed by team, or reported by patient.  Note: The patient tolerated the entire procedure well. A repeat set of vitals were taken after the procedure and the patient was kept under observation following institutional policy, for this type of procedure. Post-procedural neurological assessment was performed, showing return to baseline, prior to discharge. The patient was provided with post-procedure discharge instructions, including a section on how to identify potential problems. Should any problems arise concerning this procedure, the patient was given instructions to immediately contact us, at any time, without hesitation. In any case, we plan to contact the patient by telephone for a follow-up status report regarding this interventional procedure.  Comments:  No additional relevant information. 5 out of 5 strength bilateral upper extremity: Shoulder abduction, elbow flexion, elbow extension, thumb extension.  Plan of Care    Imaging Orders     DG C-Arm 1-60 Min-No Report Procedure Orders    No procedure(s) ordered  today    Medications ordered for procedure: Meds ordered this encounter  Medications  . lactated ringers infusion 1,000 mL  . fentaNYL (SUBLIMAZE) injection 25-100 mcg    Make sure Narcan is available in the pyxis when using this medication. In the event of respiratory depression (RR< 8/min): Titrate NARCAN (naloxone) in increments of 0.1 to 0.2 mg IV at 2-3 minute intervals, until desired degree of  reversal.  . lidocaine (PF) (XYLOCAINE) 1 % injection 10 mL  . ropivacaine (PF) 2 mg/mL (0.2%) (NAROPIN) injection 10 mL  . dexamethasone (DECADRON) injection 10 mg   Medications administered: We administered lactated ringers, fentaNYL, lidocaine (PF), ropivacaine (PF) 2 mg/mL (0.2%), and dexamethasone.  See the medical record for exact dosing, route, and time of administration.  New Prescriptions   No medications on file   Disposition: Discharge home  Discharge Date & Time: 09/28/2017; 1255 hrs.   Physician-requested Follow-up: Return in about 4 weeks (around 10/26/2017) for Post Procedure Evaluation.  Future Appointments  Date Time Provider [place]  10/17/2017  9:45 AM [facility] None  10/26/2017  9:30 AM Gillis Santa, MD [facility] None  11/23/2017  2:20 PM Steele Sizer, MD [place] [facility]  05/11/2018  3:00 PM Rubie Maid, MD [facility] None   Primary Care Physician: Steele Sizer, MD Location: [facility] Note by: Gillis Santa, MD Date: 09/28/2017; Time: 1:22 PM  Disclaimer:  Medicine is not an exact science. The only guarantee in medicine is that nothing is guaranteed. It is important to note that the decision to proceed with this intervention was based on the information collected from the patient. The Data and conclusions were drawn from the patient's questionnaire, the interview, and the physical examination. Because the information was provided in large part by the patient, it cannot be guaranteed that it has not  been purposely or unconsciously manipulated. Every effort has been made to obtain as much relevant data as possible for this evaluation. It is important to note that the conclusions that lead to this procedure are derived in large part from the available data. Always take into account that the treatment will also be dependent on availability of resources and existing treatment guidelines, considered by other Pain Management Practitioners as being common knowledge and practice, at the time of the intervention. For Medico-Legal purposes, it is also important to point out that variation in procedural techniques and pharmacological choices are the acceptable norm. The indications, contraindications, technique, and results of the above procedure should only be interpreted and judged by a Board-Certified Interventional Pain Specialist with extensive familiarity and expertise in the same exact procedure and technique.

## 2017-09-28 NOTE — Progress Notes (Signed)
Safety precautions to be maintained throughout the outpatient stay will include: orient to surroundings, keep bed in low position, maintain call bell within reach at all times, provide assistance with transfer out of bed and ambulation.  

## 2017-09-29 ENCOUNTER — Telehealth: Payer: Self-pay

## 2017-09-29 NOTE — Telephone Encounter (Signed)
States she is feeling better,. Instructed to call if needed.

## 2017-10-14 ENCOUNTER — Encounter: Payer: Self-pay | Admitting: Family Medicine

## 2017-10-14 ENCOUNTER — Other Ambulatory Visit: Payer: Self-pay | Admitting: Family Medicine

## 2017-10-14 DIAGNOSIS — F339 Major depressive disorder, recurrent, unspecified: Secondary | ICD-10-CM

## 2017-10-14 DIAGNOSIS — F411 Generalized anxiety disorder: Secondary | ICD-10-CM

## 2017-10-14 MED ORDER — VENLAFAXINE HCL ER 150 MG PO CP24: 150.0000 mg | ORAL_CAPSULE | Freq: Every day | ORAL | 5 refills | Status: DC

## 2017-10-17 ENCOUNTER — Ambulatory Visit (INDEPENDENT_AMBULATORY_CARE_PROVIDER_SITE_OTHER): Payer: Medicaid Other | Admitting: Urology

## 2017-10-17 ENCOUNTER — Encounter: Payer: Self-pay | Admitting: Urology

## 2017-10-17 VITALS — BP 127/83 | HR 75 | Ht 64.0 in | Wt 182.0 lb

## 2017-10-17 DIAGNOSIS — N3946 Mixed incontinence: Secondary | ICD-10-CM | POA: Diagnosis not present

## 2017-10-17 MED ORDER — MIRABEGRON ER 50 MG PO TB24: 50.0000 mg | ORAL_TABLET | Freq: Every day | ORAL | 11 refills | Status: DC

## 2017-10-17 NOTE — Progress Notes (Signed)
10/17/2017 9:50 AM   Judy Allen October 22, 1955 [id number]  Referring provider: Steele Sizer, MD [address] [place], [place] [address]  Chief Complaint  Patient presents with  . Follow-up    HPI: I was consulted to assess the patient's urinary incontinence worsening over a few months. In the last year or so she had anal cancer with chemotherapy and radiation but no surgery. She can leak without awareness but also describes urgency and trying to undress prior to urinating. She denies stress incontinence. She denied bedwetting but then noted a foul-smelling discharge from the urethra she thinks. She tends towards constipation. She may have a vaginal discharge that is clear. She wears 2 pads a day sometimes damp sometimes moderately wet  She has had 2 neck surgery and one lower back operation. She has not had a hysterectomy  GU:On pelvic examination the patient had moderately severe vaginal atrophy. I saw no vaginitis discharge blood or urine in the vaginal vault. She had a grade 1 or small grade 2 cystocele. She had elevated tissue underneath the urethra that was nontender and she probably does not have a urethral diverticulum but she could. She had a small distal rectocele. The anus looked within normal limits  The patient has urinary incontinence not associated with awareness. She denies stress incontinence but I believe she also has urgency incontinence. She has had pelvic radiation. Her degree of leakage and pelvic examis not in keeping with the fistula. The patient did not have blood in the urine but I sent the urine for culture. I like to see the patient on Myrbetriq 25 mg in about 1 month for cystoscopy. We will proceed accordingly. I may or may not order urodynamics immediately.   Last visit The patient was 80% improved on Myrbetriq but Medicaid will not cover it.   Cystoscopy: Utilizing sterile technique patient underwent flexible  cystoscopy. The bladder mucosa and trigone were normal. There was no radiation cystitis. There is no carcinoma. There was no fistula. She tolerated the procedure well  The patient's incontinence is almost back to baseline.  Lisbeth Ply did not work.  Once again she said she was doing great on Myrbetriq but she says she does not think Medicaid covers any of it.   I gave her 2 prescriptions one for Detrol LA 4 mg and oxybutynin ER 10 mg and reassess in 8 weeks.  I might order urodynamics.  She is on home oxygen.  I do not think InterStim with her COPD may not be a good option.  With her radiation I do not think Botox is a very good option.  Percutaneous tibial nerve stimulation is an option.  Today Frequency stable.  Clinically not infected.  Urge incontinence and leakage without awareness persist  The patient failed oxybutynin and tolterodine.   PMH: Past Medical History:  Diagnosis Date  . Anemia    H/O  . Anxiety   . Arthritis   . Colon cancer ([place]) 2017   anal ca/chemo and rad  . Complication of anesthesia   . Constipation   . COPD (chronic obstructive pulmonary disease) ([facility])    also emphysema  . Depression   . Dysrhythmia    TACHYCARDIA-WELL CONTROLLED ON METOPROLO  . GERD (gastroesophageal reflux disease)   . H/O allergic rhinitis   . Heart murmur   . Hemorrhoid   . Hyperlipidemia   . Hypertension   . Neck pain, chronic   . Neuritis   . Ovarian failure   .  Personal history of chemotherapy   . Personal history of radiation therapy   . PONV (postoperative nausea and vomiting)   . Pre-diabetes 2019   just being followed.  no meds  . Proteinuria   . Psoriasis   . Sleep apnea 09/12/2016   8cm H2O with 21pm oxygen mask: Eson size Medium. Heated humidifier for nasal dryness.  . Tachycardia, paroxysmal ([place])   . Urinary incontinence     Surgical History: Past Surgical History:  Procedure Laterality Date  . ANTERIOR FUSION LUMBAR SPINE  1990   plate in back, not  metal  . CARDIAC CATHETERIZATION  2009?    [facility];[place]  . CARDIAC CATHETERIZATION  05/2012   [facility]: Mild hypertension 37/12 with a mean pressure of 21 mm mercury  . CHOLECYSTECTOMY N/A 09/12/2017   Procedure: LAPAROSCOPIC CHOLECYSTECTOMY WITH INTRAOPERATIVE CHOLANGIOGRAM;  Surgeon: Robert Bellow, MD;  Location: [facility] ORS;  Service: General;  Laterality: N/A;  . COLONOSCOPY  2018  . herniated disc repair  2001  . LIVER BIOPSY N/A 09/12/2017   Procedure: LIVER BIOPSY;  Surgeon: Robert Bellow, MD;  Location: [facility] OR[facility];  Service: General;  Laterality: N/A;  . MASS EXCISION Left 02/23/2016   Procedure: EXCISION MASS;  Surgeon: Christene Lye, MD;  Location: [facility] ORS;  Service: General;  Laterality: Left;  . NECK SURGERY    . PORT-A-CATH REMOVAL  2018  . PORTACATH PLACEMENT Left 03/12/2016   Procedure: INSERTION PORT-A-CATH;  Surgeon: Christene Lye, MD;  Location: [facility] ORS;  Service: General;  Laterality: Left;  . tonsillectomy  1959   history  . TUBAL LIGATION     s/p    Home Medications:  Allergies as of 10/17/2017      Reactions   Dapsone Other (See Comments)   Hypoxemia   Sulfa Antibiotics Rash      Medication List        Accurate as of 10/17/17  9:50 AM. Always use your most recent med list.          albuterol 108 (90 Base) MCG/ACT inhaler Commonly known as:  PROVENTIL HFA;VENTOLIN HFA Inhale 2 puffs into the lungs every 6 (six) hours as needed for wheezing or shortness of breath.   betamethasone dipropionate 0.05 % cream Commonly known as:  DIPROLENE Apply 1 application topically 2 (two) times daily as needed.   [employer] B-12 1000 MCG Tbcr Generic drug:  Cyanocobalamin TAKE 1 TABLET (1,000 MCG TOTAL) BY MOUTH DAILY.   divalproex 250 MG DR tablet Commonly known as:  DEPAKOTE Take 1 tablet (250 mg total) by mouth at bedtime.   gabapentin 300 MG capsule Commonly known as:  NEURONTIN Take 1 capsule (300 mg total) by mouth 3 (three) times daily.     hydrochlorothiazide 25 MG tablet Commonly known as:  HYDRODIURIL Take 1 tablet (25 mg total) by mouth daily.   HYDROcodone-acetaminophen 5-325 MG tablet Commonly known as:  NORCO/VICODIN Take 1 tablet by mouth 2 (two) times daily.   hydrocortisone 2.5 % cream Apply 1 application topically 2 (two) times daily.   ketoconazole 2 % cream Commonly known as:  NIZORAL Apply 1 application topically 2 (two) times daily.   metoprolol succinate 50 MG 24 hr tablet Commonly known as:  TOPROL-XL Take 1 tablet (50 mg total) by mouth daily.   omega-3 acid ethyl esters 1 g capsule Commonly known as:  LOVAZA Take 2 capsules (2 g total) 2 (two) times daily by mouth.   omeprazole 40 MG capsule Commonly known as:  PRILOSEC Take 40 mg by mouth daily.   oxybutynin 10 MG 24 hr tablet Commonly known as:  DITROPAN-XL Take 1 tablet (10 mg total) by mouth daily.   OXYGEN Inhale 2 L into the lungs continuous.   tiotropium 18 MCG inhalation capsule Commonly known as:  SPIRIVA Place 18 mcg into inhaler and inhale daily.   tiZANidine 4 MG tablet Commonly known as:  ZANAFLEX TAKE 1 TABLET (4 MG TOTAL) BY MOUTH 3 (THREE) TIMES DAILY.   venlafaxine XR 150 MG 24 hr capsule Commonly known as:  EFFEXOR-XR Take 1 capsule (150 mg total) by mouth daily with breakfast.       Allergies:  Allergies  Allergen Reactions  . Dapsone Other (See Comments)    Hypoxemia  . Sulfa Antibiotics Rash    Family History: Family History  Problem Relation Age of Onset  . Heart attack Mother   . Asthma Mother   . Liver cancer Father   . Breast cancer Maternal Aunt   . Bladder Cancer Neg Hx   . Kidney cancer Neg Hx     Social History:  reports that she quit smoking about 11 years ago. Her smoking use included cigarettes. She started smoking about 54 years ago. She has a 32.00 pack-year smoking history. she has never used smokeless tobacco. She reports that she does not drink alcohol or use  drugs.  ROS: UROLOGY Frequent Urination?: Yes Hard to postpone urination?: No Burning/pain with urination?: No Get up at night to urinate?: No Leakage of urine?: Yes Urine stream starts and stops?: Yes Trouble starting stream?: No Do you have to strain to urinate?: No Blood in urine?: No Urinary tract infection?: No Sexually transmitted disease?: No Injury to kidneys or bladder?: No Painful intercourse?: No Weak stream?: No Currently pregnant?: No Vaginal bleeding?: No Last menstrual period?: n  Gastrointestinal Nausea?: Yes Vomiting?: No Indigestion/heartburn?: No Diarrhea?: No Constipation?: No  Constitutional Fever: No Night sweats?: No Weight loss?: No Fatigue?: No  Skin Skin rash/lesions?: No Itching?: No  Eyes Blurred vision?: No Double vision?: No  Ears/Nose/Throat Sore throat?: No Sinus problems?: No  Hematologic/Lymphatic Swollen glands?: No Easy bruising?: No  Cardiovascular Leg swelling?: No Chest pain?: No  Respiratory Cough?: No Shortness of breath?: Yes  Endocrine Excessive thirst?: No  Musculoskeletal Back pain?: Yes Joint pain?: No  Neurological Headaches?: Yes Dizziness?: Yes  Psychologic Depression?: Yes Anxiety?: Yes  Physical Exam: BP 127/83   Pulse 75   Ht 5\' 4"  (1.626 m)   Wt 182 lb (82.6 kg)   LMP 03/04/2002 (Approximate)   BMI 31.24 kg/m   Constitutional:  Alert and oriented, No acute distress.  Laboratory Data: Lab Results  Component Value Date   WBC 7.8 08/03/2017   HGB 12.8 08/03/2017   HCT 37.0 08/03/2017   MCV 88.3 08/03/2017   PLT 262 08/03/2017    Lab Results  Component Value Date   CREATININE 0.87 08/03/2017    No results found for: PSA  No results found for: TESTOSTERONE  Lab Results  Component Value Date   HGBA1C 6.1 (H) 08/29/2017    Urinalysis    Component Value Date/Time   COLORURINE STRAW (A) 11/02/2015 1227   APPEARANCEUR Clear 07/04/2017 1047   LABSPEC 1.008  11/02/2015 1227   LABSPEC 1.005 06/08/2012 1117   PHURINE 7.0 11/02/2015 1227   GLUCOSEU Negative 07/04/2017 1047   GLUCOSEU Negative 06/08/2012 1117   HGBUR NEGATIVE 11/02/2015 1227   BILIRUBINUR Negative 07/04/2017 1047   BILIRUBINUR Negative 06/08/2012  [place] 11/02/2015 1227   PROTEINUR Negative 07/04/2017 [place] 11/02/2015 1227   NITRITE Negative 07/04/2017 1047   NITRITE NEGATIVE 11/02/2015 1227   LEUKOCYTESUR Trace 07/04/2017 1047   LEUKOCYTESUR Negative 06/08/2012 1117    Pertinent Imaging:   Assessment & Plan: The patient has refractory urgency incontinence and leakage without awareness and is done beautifully on Myrbetriq.  I gave her samples and prescription.  Because of radiation she is not a good candidate for Botox.  Because a home oxygen and COPD she is not a good candidate in my opinion for InterStim.  I do not think that Medicaid will cover percutaneous tibial nerve stimulation.  More samples and prescription given.  Hopefully with step therapy Medicaid will cover the only medication that is working for her.  I will reevaluate her in 6 weeks  There are no diagnoses linked to this encounter.  No Follow-up on file.  Reece Packer, MD  [facility] [address] [place], [place] [address] [phone]

## 2017-10-26 ENCOUNTER — Ambulatory Visit
Payer: Medicaid Other | Attending: Student in an Organized Health Care Education/Training Program | Admitting: Student in an Organized Health Care Education/Training Program

## 2017-10-26 ENCOUNTER — Encounter: Payer: Self-pay | Admitting: Student in an Organized Health Care Education/Training Program

## 2017-10-26 ENCOUNTER — Other Ambulatory Visit: Payer: Self-pay

## 2017-10-26 VITALS — BP 124/77 | HR 86 | Temp 98.1°F | Resp 16 | Ht 64.0 in | Wt 182.0 lb

## 2017-10-26 DIAGNOSIS — D649 Anemia, unspecified: Secondary | ICD-10-CM | POA: Diagnosis not present

## 2017-10-26 DIAGNOSIS — G4730 Sleep apnea, unspecified: Secondary | ICD-10-CM | POA: Insufficient documentation

## 2017-10-26 DIAGNOSIS — Z85038 Personal history of other malignant neoplasm of large intestine: Secondary | ICD-10-CM | POA: Insufficient documentation

## 2017-10-26 DIAGNOSIS — I10 Essential (primary) hypertension: Secondary | ICD-10-CM | POA: Diagnosis not present

## 2017-10-26 DIAGNOSIS — K219 Gastro-esophageal reflux disease without esophagitis: Secondary | ICD-10-CM | POA: Insufficient documentation

## 2017-10-26 DIAGNOSIS — F329 Major depressive disorder, single episode, unspecified: Secondary | ICD-10-CM | POA: Insufficient documentation

## 2017-10-26 DIAGNOSIS — Z79899 Other long term (current) drug therapy: Secondary | ICD-10-CM | POA: Insufficient documentation

## 2017-10-26 DIAGNOSIS — M542 Cervicalgia: Secondary | ICD-10-CM | POA: Diagnosis present

## 2017-10-26 DIAGNOSIS — D692 Other nonthrombocytopenic purpura: Secondary | ICD-10-CM | POA: Insufficient documentation

## 2017-10-26 DIAGNOSIS — F411 Generalized anxiety disorder: Secondary | ICD-10-CM | POA: Diagnosis not present

## 2017-10-26 DIAGNOSIS — E781 Pure hyperglyceridemia: Secondary | ICD-10-CM | POA: Insufficient documentation

## 2017-10-26 DIAGNOSIS — G894 Chronic pain syndrome: Secondary | ICD-10-CM | POA: Diagnosis not present

## 2017-10-26 DIAGNOSIS — F339 Major depressive disorder, recurrent, unspecified: Secondary | ICD-10-CM | POA: Diagnosis not present

## 2017-10-26 DIAGNOSIS — R7303 Prediabetes: Secondary | ICD-10-CM | POA: Insufficient documentation

## 2017-10-26 DIAGNOSIS — C7951 Secondary malignant neoplasm of bone: Secondary | ICD-10-CM | POA: Insufficient documentation

## 2017-10-26 DIAGNOSIS — K5909 Other constipation: Secondary | ICD-10-CM | POA: Diagnosis not present

## 2017-10-26 DIAGNOSIS — J9611 Chronic respiratory failure with hypoxia: Secondary | ICD-10-CM | POA: Diagnosis not present

## 2017-10-26 DIAGNOSIS — R32 Unspecified urinary incontinence: Secondary | ICD-10-CM | POA: Diagnosis not present

## 2017-10-26 DIAGNOSIS — J432 Centrilobular emphysema: Secondary | ICD-10-CM | POA: Insufficient documentation

## 2017-10-26 DIAGNOSIS — J449 Chronic obstructive pulmonary disease, unspecified: Secondary | ICD-10-CM | POA: Insufficient documentation

## 2017-10-26 DIAGNOSIS — R000 Tachycardia, unspecified: Secondary | ICD-10-CM | POA: Diagnosis not present

## 2017-10-26 DIAGNOSIS — M47812 Spondylosis without myelopathy or radiculopathy, cervical region: Secondary | ICD-10-CM | POA: Insufficient documentation

## 2017-10-26 DIAGNOSIS — Z9981 Dependence on supplemental oxygen: Secondary | ICD-10-CM | POA: Insufficient documentation

## 2017-10-26 MED ORDER — HYDROCODONE-ACETAMINOPHEN 7.5-325 MG PO TABS: 1.0000 | ORAL_TABLET | Freq: Two times a day (BID) | ORAL | 0 refills | Status: DC | PRN

## 2017-10-26 NOTE — Progress Notes (Signed)
Safety precautions to be maintained throughout the outpatient stay will include: orient to surroundings, keep bed in low position, maintain call bell within reach at all times, provide assistance with transfer out of bed and ambulation.  

## 2017-10-26 NOTE — Patient Instructions (Addendum)
1.  Sign opiate agreement 2.  Prescription for hydrocodone for 2 months 3.  Scheduled for repeat bilateral cervical facet medial branch nerve block #2 with sedation in 1-2 weeks.____________________________________________________________________________________________  General Risks and Possible Complications  Patient Responsibilities: It is important that you read this as it is part of your informed consent. It is our duty to inform you of the risks and possible complications associated with treatments offered to you. It is your responsibility as a patient to read this and to ask questions about anything that is not clear or that you believe was not covered in this document.  Patient's Rights: You have the right to refuse treatment. You also have the right to change your mind, even after initially having agreed to have the treatment done. However, under this last option, if you wait until the last second to change your mind, you may be charged for the materials used up to that point.  Introduction: Medicine is not an Chief Strategy Officer. Everything in Medicine, including the lack of treatment(s), carries the potential for danger, harm, or loss (which is by definition: Risk). In Medicine, a complication is a secondary problem, condition, or disease that can aggravate an already existing one. All treatments carry the risk of possible complications. The fact that a side effects or complications occurs, does not imply that the treatment was conducted incorrectly. It must be clearly understood that these can happen even when everything is done following the highest safety standards.  No treatment: You can choose not to proceed with the proposed treatment alternative. The "PRO(s)" would include: avoiding the risk of complications associated with the therapy. The "CON(s)" would include: not getting any of the treatment benefits. These benefits fall under one of three categories: diagnostic; therapeutic; and/or  palliative. Diagnostic benefits include: getting information which can ultimately lead to improvement of the disease or symptom(s). Therapeutic benefits are those associated with the successful treatment of the disease. Finally, palliative benefits are those related to the decrease of the primary symptoms, without necessarily curing the condition (example: decreasing the pain from a flare-up of a chronic condition, such as incurable terminal cancer).  General Risks and Complications: These are associated to most interventional treatments. They can occur alone, or in combination. They fall under one of the following six (6) categories: no benefit or worsening of symptoms; bleeding; infection; nerve damage; allergic reactions; and/or death. 1. No benefits or worsening of symptoms: In Medicine there are no guarantees, only probabilities. No healthcare provider can ever guarantee that a medical treatment will work, they can only state the probability that it may. Furthermore, there is always the possibility that the condition may worsen, either directly, or indirectly, as a consequence of the treatment. 2. Bleeding: This is more common if the patient is taking a blood thinner, either prescription or over the counter (example: Goody Powders, Fish oil, Aspirin, Garlic, etc.), or if suffering a condition associated with impaired coagulation (example: Hemophilia, cirrhosis of the liver, low platelet counts, etc.). However, even if you do not have one on these, it can still happen. If you have any of these conditions, or take one of these drugs, make sure to notify your treating physician. 3. Infection: This is more common in patients with a compromised immune system, either due to disease (example: diabetes, cancer, human immunodeficiency virus [HIV], etc.), or due to medications or treatments (example: therapies used to treat cancer and rheumatological diseases). However, even if you do not have one on these, it can  still happen. If you have any of these conditions, or take one of these drugs, make sure to notify your treating physician. 4. Nerve Damage: This is more common when the treatment is an invasive one, but it can also happen with the use of medications, such as those used in the treatment of cancer. The damage can occur to small secondary nerves, or to large primary ones, such as those in the spinal cord and brain. This damage may be temporary or permanent and it may lead to impairments that can range from temporary numbness to permanent paralysis and/or brain death. 5. Allergic Reactions: Any time a substance or material comes in contact with our body, there is the possibility of an allergic reaction. These can range from a mild skin rash (contact dermatitis) to a severe systemic reaction (anaphylactic reaction), which can result in death. 6. Death: In general, any medical intervention can result in death, most of the time due to an unforeseen complication. ____________________________________________________________________________________________  Facet Blocks Patient Information  Description: The facets are joints in the spine between the vertebrae.  Like any joints in the body, facets can become irritated and painful.  Arthritis can also effect the facets.  By injecting steroids and local anesthetic in and around these joints, we can temporarily block the nerve supply to them.  Steroids act directly on irritated nerves and tissues to reduce selling and inflammation which often leads to decreased pain.  Facet blocks may be done anywhere along the spine from the neck to the low back depending upon the location of your pain.   After numbing the skin with local anesthetic (like Novocaine), a small needle is passed onto the facet joints under x-ray guidance.  You may experience a sensation of pressure while this is being done.  The entire block usually lasts about 15-25 minutes.   Conditions which may be  treated by facet blocks:   Low back/buttock pain  Neck/shoulder pain  Certain types of headaches  Preparation for the injection:  1. Do not eat any solid food or dairy products within 8 hours of your appointment. 2. You may drink clear liquid up to 3 hours before appointment.  Clear liquids include water, black coffee, juice or soda.  No milk or cream please. 3. You may take your regular medication, including pain medications, with a sip of water before your appointment.  Diabetics should hold regular insulin (if taken separately) and take 1/2 normal NPH dose the morning of the procedure.  Carry some sugar containing items with you to your appointment. 4. A driver must accompany you and be prepared to drive you home after your procedure. 5. Bring all your current medications with you. 6. An IV may be inserted and sedation may be given at the discretion of the physician. 7. A blood pressure cuff, EKG and other monitors will often be applied during the procedure.  Some patients may need to have extra oxygen administered for a short period. 8. You will be asked to provide medical information, including your allergies and medications, prior to the procedure.  We must know immediately if you are taking blood thinners (like Coumadin/Warfarin) or if you are allergic to IV iodine contrast (dye).  We must know if you could possible be pregnant.  Possible side-effects:   Bleeding from needle site  Infection (rare, may require surgery)  Nerve injury (rare)  Numbness & tingling (temporary)  Difficulty urinating (rare, temporary)  Spinal headache (a headache worse with upright posture)  Light-headedness (  temporary)  Pain at injection site (serveral days)  Decreased blood pressure (rare, temporary)  Weakness in arm/leg (temporary)  Pressure sensation in back/neck (temporary)   Call if you experience:   Fever/chills associated with headache or increased back/neck pain  Headache  worsened by an upright position  New onset, weakness or numbness of an extremity below the injection site  Hives or difficulty breathing (go to the emergency room)  Inflammation or drainage at the injection site(s)  Severe back/neck pain greater than usual  New symptoms which are concerning to you  Please note:  Although the local anesthetic injected can often make your back or neck feel good for several hours after the injection, the pain will likely return. It takes 3-7 days for steroids to work.  You may not notice any pain relief for at least one week.  If effective, we will often do a series of 2-3 injections spaced 3-6 weeks apart to maximally decrease your pain.  After the initial series, you may be a candidate for a more permanent nerve block of the facets.  If you have any questions, please call #[phone]) [facility]

## 2017-10-26 NOTE — Progress Notes (Signed)
Patient's Name: Judy Allen  MRN: [id number]  Referring Provider: Steele Sizer, MD  DOB: 1956-02-14  PCP: Steele Sizer, MD  DOS: 10/26/2017  Note by: Gillis Santa, MD  Service setting: Ambulatory outpatient  Specialty: Interventional Pain Management  Location: [facility] (AMB) Pain Management Facility    Patient type: Established   Primary Reason(s) for Visit: Encounter for post-procedure evaluation of chronic illness with mild to moderate exacerbation CC: Neck Pain  HPI  Judy Allen is a 62 y.o. year old, female patient, who comes today for a post-procedure evaluation. She has Tachycardia, paroxysmal ([place]); Benign hypertension; Chronic cervical pain; CN (constipation); Gastric reflux; Hypertriglyceridemia; Edema leg; Chronic recurrent major depressive disorder ([place]); Dysmetabolic syndrome; Obstructive apnea; Psoriasis; Allergic rhinitis; Bursitis, trochanteric; GAD (generalized anxiety disorder); History of fusion of cervical spine; Chronic constipation; Non-thrombocytopenic purpura ([place]); Metastatic squamous cell carcinoma ([place]); Anal squamous cell carcinoma ([place]); Abnormal Pap smear of cervix; Centrilobular emphysema ([place]); Chronic respiratory failure with hypoxia, on home oxygen therapy ([place]); Fatty liver; 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 Her primarily concern today is the Neck Pain  Pain Assessment: Location:   Neck Radiating: tingling moves to left shoulder and down left arm Onset: More than a month ago Duration: Chronic pain Quality: Tingling, Constant Severity: 5 /10 (self-reported pain score)  Note: Reported level is compatible with observation.                         When using our objective Pain Scale, levels between 6 and 10/10 are said to belong in an emergency room, as it progressively worsens from a 6/10, described as severely limiting, requiring emergency care not usually available at an outpatient pain  management facility. At a 6/10 level, communication becomes difficult and requires great effort. Assistance to reach the emergency department may be required. Facial flushing and profuse sweating along with potentially dangerous increases in heart rate and blood pressure will be evident. Effect on ADL: ADLs take longer r/t need for pt to take breaks r/t pain Timing: Constant Modifying factors: procedures, medications  Judy Allen comes in today for post-procedure evaluation after the treatment done on 09/28/2017.  Further details on both, my assessment(s), as well as the proposed treatment plan, please see below.  Post-Procedure Assessment  09/28/2017 Procedure: Bilateral C4, C5, C6, C7 facet medial branch nerve block Pre-procedure pain score:  6/10 Post-procedure pain score: 0/10         Influential Factors: BMI: 31.24 kg/m Intra-procedural challenges: None observed.         Assessment challenges: None detected.              Reported side-effects: None.        Post-procedural adverse reactions or complications: None reported         Sedation: Please see nurses note. When no sedatives are used, the analgesic levels obtained are directly associated to the effectiveness of the local anesthetics. However, when sedation is provided, the level of analgesia obtained during the initial 1 hour following the intervention, is believed to be the result of a combination of factors. These factors may include, but are not limited to: 1. The effectiveness of the local anesthetics used. 2. The effects of the analgesic(s) and/or anxiolytic(s) used. 3. The degree of discomfort experienced by the patient at the time of the procedure. 4. The patients ability and reliability in recalling and recording the events. 5. The presence and influence  of possible secondary gains and/or psychosocial factors. Reported result: Relief experienced during the 1st hour after the procedure: 100 % (Ultra-Short Term Relief)             Interpretative annotation: Clinically appropriate result. Analgesia during this period is likely to be Local Anesthetic and/or IV Sedative (Analgesic/Anxiolytic) related.          Effects of local anesthetic: The analgesic effects attained during this period are directly associated to the localized infiltration of local anesthetics and therefore cary significant diagnostic value as to the etiological location, or anatomical origin, of the pain. Expected duration of relief is directly dependent on the pharmacodynamics of the local anesthetic used. Long-acting (4-6 hours) anesthetics used.  Reported result: Relief during the next 4 to 6 hour after the procedure: 100 % (Short-Term Relief)            Interpretative annotation: Clinically appropriate result. Analgesia during this period is likely to be Local Anesthetic-related.          Long-term benefit: Defined as the period of time past the expected duration of local anesthetics (1 hour for short-acting and 4-6 hours for long-acting). With the possible exception of prolonged sympathetic blockade from the local anesthetics, benefits during this period are typically attributed to, or associated with, other factors such as analgesic sensory neuropraxia, antiinflammatory effects, or beneficial biochemical changes provided by agents other than the local anesthetics.  Reported result: Extended relief following procedure: 50 %(100% relief for 4 days) (Long-Term Relief)            Interpretative annotation: Clinically appropriate result. Good relief. No permanent benefit expected. Inflammation plays a part in the etiology to the pain.          Current benefits: Defined as reported results that persistent at this point in time.   Analgesia: 25-50 % Judy Allen reports that both, extremity and the axial pain improved with the treatment. Function: Somewhat improved ROM: Somewhat improved Interpretative annotation: Recurrence of symptoms. No permanent benefit  expected. Effective diagnostic intervention.          Interpretation: Results would suggest a successful diagnostic intervention. We'll proceed with diagnostic intervention #2, as soon as convenient          Plan:  Please see "Plan of Care" for details.                Laboratory Chemistry  Inflammation Markers (CRP: Acute Phase) (ESR: Chronic Phase) No results found for: CRP, ESRSEDRATE, LATICACIDVEN                       Rheumatology Markers No results found for: RF, ANA, Therisa D[place], [facility]              Renal Function Markers Lab Results  Component Value Date   BUN 12 08/03/2017   CREATININE 0.87 08/03/2017   GFRAA 83 08/03/2017   GFRNONAA 71 08/03/2017                 Hepatic Function Markers Lab Results  Component Value Date   AST 83 (H) 08/29/2017   ALT 103 (H) 08/29/2017   ALBUMIN 4.3 03/03/2017   ALKPHOS 53 03/03/2017   LIPASE 27 08/03/2017                 Electrolytes Lab Results  Component Value Date   NA 140 08/03/2017   K 3.9 08/03/2017   CL 98 08/03/2017   CALCIUM 10.3 08/03/2017  Neuropathy Markers Lab Results  Component Value Date   HGBA1C 6.1 (H) 08/29/2017                 Bone Pathology Markers No results found for: VD25OH, [id number], QJ1941DE0, CX4481EH6, 25OHVITD1, 25OHVITD2, 25OHVITD3, TESTOFREE, TESTOSTERONE                       Coagulation Parameters Lab Results  Component Value Date   INR 1.00 09/12/2017   LABPROT 13.1 09/12/2017   APTT 30 09/12/2017   PLT 262 08/03/2017                 Cardiovascular Markers Lab Results  Component Value Date   CKTOTAL 128 06/08/2012   CKMB 2.2 06/08/2012   TROPONINI < 0.02 06/08/2012   HGB 12.8 08/03/2017   HCT 37.0 08/03/2017                 CA Markers No results found for: CEA, CA125, LABCA2               Note: Lab results reviewed.  Recent Diagnostic Imaging Results  DG C-Arm 1-60 Min-No Report Fluoroscopy was utilized by the  requesting physician.  No radiographic  interpretation.   Complexity Note: Imaging results reviewed. Results shared with Judy Allen, using Layman's terms.                         Meds   Current Outpatient Medications:  .  albuterol (PROVENTIL HFA;VENTOLIN HFA) 108 (90 Base) MCG/ACT inhaler, Inhale 2 puffs into the lungs every 6 (six) hours as needed for wheezing or shortness of breath., Disp: , Rfl:  .  betamethasone dipropionate (DIPROLENE) 0.05 % cream, Apply 1 application topically 2 (two) times daily as needed., Disp: , Rfl:  .  [employer] B-12 1000 MCG TBCR, TAKE 1 TABLET (1,000 MCG TOTAL) BY MOUTH DAILY., Disp: , Rfl: 11 .  divalproex (DEPAKOTE) 250 MG DR tablet, Take 1 tablet (250 mg total) by mouth at bedtime., Disp: 30 tablet, Rfl: 5 .  gabapentin (NEURONTIN) 300 MG capsule, Take 1 capsule (300 mg total) by mouth 3 (three) times daily., Disp: 90 capsule, Rfl: 3 .  hydrochlorothiazide (HYDRODIURIL) 25 MG tablet, Take 1 tablet (25 mg total) by mouth daily., Disp: 30 tablet, Rfl: 5 .  hydrocortisone 2.5 % cream, Apply 1 application topically 2 (two) times daily., Disp: , Rfl:  .  ketoconazole (NIZORAL) 2 % cream, Apply 1 application topically 2 (two) times daily., Disp: , Rfl:  .  metoprolol succinate (TOPROL-XL) 50 MG 24 hr tablet, Take 1 tablet (50 mg total) by mouth daily., Disp: 30 tablet, Rfl: 5 .  mirabegron ER (MYRBETRIQ) 50 MG TB24 tablet, Take 1 tablet (50 mg total) by mouth daily., Disp: 30 tablet, Rfl: 11 .  omega-3 acid ethyl esters (LOVAZA) 1 g capsule, Take 2 capsules (2 g total) 2 (two) times daily by mouth., Disp: 120 capsule, Rfl: 5 .  omeprazole (PRILOSEC) 40 MG capsule, Take 40 mg by mouth daily. , Disp: , Rfl: 5 .  OXYGEN, Inhale 2 L into the lungs continuous. , Disp: , Rfl:  .  tiZANidine (ZANAFLEX) 4 MG tablet, TAKE 1 TABLET (4 MG TOTAL) BY MOUTH 3 (THREE) TIMES DAILY., Disp: 90 tablet, Rfl: 2 .  venlafaxine XR (EFFEXOR-XR) 150 MG 24 hr capsule, Take 1 capsule (150 mg  total) by mouth daily with breakfast., Disp: 30 capsule, Rfl: 5 .  HYDROcodone-acetaminophen (  NORCO) 7.5-325 MG tablet, Take 1 tablet by mouth 2 (two) times daily as needed for moderate pain. For chronic pain To fill on or after: 10/26/2017, 11/25/2017 To last for 30 days from fill date, Disp: 60 tablet, Rfl: 0 .  oxybutynin (DITROPAN-XL) 10 MG 24 hr tablet, Take 1 tablet (10 mg total) by mouth daily. (Patient not taking: Reported on 10/26/2017), Disp: 30 tablet, Rfl: 11 .  tiotropium (SPIRIVA) 18 MCG inhalation capsule, Place 18 mcg into inhaler and inhale daily. , Disp: , Rfl:   ROS  Constitutional: Denies any fever or chills Gastrointestinal: No reported hemesis, hematochezia, vomiting, or acute GI distress Musculoskeletal: Denies any acute onset joint swelling, redness, loss of ROM, or weakness Neurological: No reported episodes of acute onset apraxia, aphasia, dysarthria, agnosia, amnesia, paralysis, loss of coordination, or loss of consciousness  Allergies  Judy Allen is allergic to dapsone and sulfa antibiotics.  PFSH  Drug: Judy Allen  reports that she does not use drugs. Alcohol:  reports that she does not drink alcohol. Tobacco:  reports that she quit smoking about 11 years ago. Her smoking use included cigarettes. She started smoking about 54 years ago. She has a 32.00 pack-year smoking history. she has never used smokeless tobacco. Medical:  has a past medical history of Anemia, Anxiety, Arthritis, Colon cancer ([place]) (9826), Complication of anesthesia, Constipation, COPD (chronic obstructive pulmonary disease) ([place]), Depression, Dysrhythmia, GERD (gastroesophageal reflux disease), H/O allergic rhinitis, Heart murmur, Hemorrhoid, Hyperlipidemia, Hypertension, Neck pain, chronic, Neuritis, Ovarian failure, Personal history of chemotherapy, Personal history of radiation therapy, PONV (postoperative nausea and vomiting), Pre-diabetes (2019), Proteinuria, Psoriasis, Sleep apnea (09/12/2016),  Tachycardia, paroxysmal ([place]), and Urinary incontinence. Surgical: Judy Allen  has a past surgical history that includes Tubal ligation; tonsillectomy (1959); herniated disc repair (2001); Anterior fusion lumbar spine (1990); Neck surgery; Mass excision (Left, 02/23/2016); Colonoscopy (2018); Portacath placement (Left, 03/12/2016); Cardiac catheterization (2009? ); Cardiac catheterization (05/2012); Port-a-cath removal (2018); Liver biopsy (N/A, 09/12/2017); and Cholecystectomy (N/A, 09/12/2017). Family: family history includes Asthma in her mother; Breast cancer in her maternal aunt; Heart attack in her mother; Liver cancer in her father.  Constitutional Exam  General appearance: Well nourished, well developed, and well hydrated. In no apparent acute distress Vitals:   10/26/17 0942  BP: 124/77  Pulse: 86  Resp: 16  Temp: 98.1 F (36.7 C)  TempSrc: Oral  SpO2: 97%  Weight: 182 lb (82.6 kg)  Height: '5\' 4"'  (1.626 m)   BMI Assessment: Estimated body mass index is 31.24 kg/m as calculated from the following:   Height as of this encounter: '5\' 4"'  (1.626 m).   Weight as of this encounter: 182 lb (82.6 kg).  BMI interpretation table: BMI level Category Range association with higher incidence of chronic pain  <18 kg/m2 Underweight   18.5-24.9 kg/m2 Ideal body weight   25-29.9 kg/m2 Overweight Increased incidence by 20%  30-34.9 kg/m2 Obese (Class I) Increased incidence by 68%  35-39.9 kg/m2 Severe obesity (Class II) Increased incidence by 136%  >40 kg/m2 Extreme obesity (Class III) Increased incidence by 254%   BMI Readings from Last 4 Encounters:  10/26/17 31.24 kg/m  10/17/17 31.24 kg/m  09/28/17 30.73 kg/m  09/27/17 30.73 kg/m   Wt Readings from Last 4 Encounters:  10/26/17 182 lb (82.6 kg)  10/17/17 182 lb (82.6 kg)  09/28/17 179 lb (81.2 kg)  09/27/17 179 lb (81.2 kg)  Psych/Mental status: Alert, oriented x 3 (person, place, & time)       Eyes:  PERLA Respiratory: No evidence of  acute respiratory distress  Cervical Spine Area Exam  Skin & Axial Inspection: No masses, redness, edema, swelling, or associated skin lesions Alignment: Symmetrical Functional ROM: Decreased ROM, bilaterally Stability: No instability detected Muscle Tone/Strength: Functionally intact. No obvious neuro-muscular anomalies detected. Sensory (Neurological): Articular pain pattern Palpation: Complains of area being tender to palpation Positive provocative maneuver for for cervical facet disease  Upper Extremity (UE) Exam    Side: Right upper extremity  Side: Left upper extremity  Skin & Extremity Inspection: Skin color, temperature, and hair growth are WNL. No peripheral edema or cyanosis. No masses, redness, swelling, asymmetry, or associated skin lesions. No contractures.  Skin & Extremity Inspection: Skin color, temperature, and hair growth are WNL. No peripheral edema or cyanosis. No masses, redness, swelling, asymmetry, or associated skin lesions. No contractures.  Functional ROM: Unrestricted ROM          Functional ROM: Unrestricted ROM          Muscle Tone/Strength: Functionally intact. No obvious neuro-muscular anomalies detected.  Muscle Tone/Strength: Functionally intact. No obvious neuro-muscular anomalies detected.  Sensory (Neurological): Unimpaired          Sensory (Neurological): Unimpaired          Palpation: No palpable anomalies              Palpation: No palpable anomalies              Specialized Test(s): Deferred         Specialized Test(s): Deferred          Thoracic Spine Area Exam  Skin & Axial Inspection: No masses, redness, or swelling Alignment: Symmetrical Functional ROM: Unrestricted ROM Stability: No instability detected Muscle Tone/Strength: Functionally intact. No obvious neuro-muscular anomalies detected. Sensory (Neurological): Unimpaired Muscle strength & Tone: No palpable anomalies  Lumbar Spine Area Exam  Skin & Axial Inspection: No masses, redness, or  swelling Alignment: Symmetrical Functional ROM: Unrestricted ROM      Stability: No instability detected Muscle Tone/Strength: Functionally intact. No obvious neuro-muscular anomalies detected. Sensory (Neurological): Unimpaired Palpation: No palpable anomalies       Provocative Tests: Lumbar Hyperextension and rotation test: evaluation deferred today       Lumbar Lateral bending test: evaluation deferred today       Patrick's Maneuver: evaluation deferred today                    Gait & Posture Assessment  Ambulation: Unassisted Gait: Relatively normal for age and body habitus Posture: WNL   Lower Extremity Exam    Side: Right lower extremity  Side: Left lower extremity  Skin & Extremity Inspection: Skin color, temperature, and hair growth are WNL. No peripheral edema or cyanosis. No masses, redness, swelling, asymmetry, or associated skin lesions. No contractures.  Skin & Extremity Inspection: Skin color, temperature, and hair growth are WNL. No peripheral edema or cyanosis. No masses, redness, swelling, asymmetry, or associated skin lesions. No contractures.  Functional ROM: Unrestricted ROM          Functional ROM: Unrestricted ROM          Muscle Tone/Strength: Functionally intact. No obvious neuro-muscular anomalies detected.  Muscle Tone/Strength: Functionally intact. No obvious neuro-muscular anomalies detected.  Sensory (Neurological): Unimpaired  Sensory (Neurological): Unimpaired  Palpation: No palpable anomalies  Palpation: No palpable anomalies   Assessment  Primary Diagnosis & Pertinent Problem List: The primary encounter diagnosis was Spondylosis of cervical region without myelopathy or radiculopathy.  Diagnoses of Cervical facet joint syndrome and Chronic pain syndrome were also pertinent to this visit.  Status Diagnosis  Responding Responding Persistent 1. Spondylosis of cervical region without myelopathy or radiculopathy   2. Cervical facet joint syndrome   3.  Chronic pain syndrome     General Recommendations: The pain condition that the patient suffers from is best treated with a multidisciplinary approach that involves an increase in physical activity to prevent de-conditioning and worsening of the pain cycle, as well as psychological counseling (formal and/or informal) to address the co-morbid psychological affects of pain. Treatment will often involve judicious use of pain medications and interventional procedures to decrease the pain, allowing the patient to participate in the physical activity that will ultimately produce long-lasting pain reductions. The goal of the multidisciplinary approach is to return the patient to a higher level of overall function and to restore their ability to perform activities of daily living.   62 year old female with a history of axial neck pain that radiates into bilateral shoulders secondary to cervical spondylosis and cervical facet arthropathy most pronounced at C4, C5, C6, C7 status post cervical facet medial branch nerve blocks at C4, 5, 6, 7 who presents for follow-up.  Patient endorses significant pain relief of her axial neck symptoms as well as shoulder pain for approximately 1 week after her injection.  She states that she was close to 100% pain relief for the first couple of days but is as noted gradual return of her neck pain over the last couple of weeks.  She is interested in having this procedure repeated.  Given that this was a positive diagnostic block that resulted and significant (greater than 75%) neck pain relief for approximately 1 week with gradual return to pre-block levels, I believe it is reasonable to proceed with diagnostic injection #3.  Regards to medication management, patient's urine drug screen from the last visit was appropriate.  Her PMP is appropriate.  I will have her complete opioid contract and give her a prescription for hydrocodone 7.5 mg twice daily as needed, quantity 59-monthfor 2  months.  Plan: -Repeat bilateral cervical facet medial branch nerve blocks at C4, C5, C6, C7 with sedation under fluoroscopy -Sign opiate agreement -Prescription for hydrocodone as below for 2 months -Continue gabapentin, tizanidine, Effexor as prescribed.  Plan of Care  Pharmacotherapy (Medications Ordered): Meds ordered this encounter  Medications  . DISCONTD: HYDROcodone-acetaminophen (NORCO) 7.5-325 MG tablet    Sig: Take 1 tablet by mouth 2 (two) times daily as needed for moderate pain. For chronic pain To fill on or after: 10/26/2017, 11/25/2017 To last for 30 days from fill date    Dispense:  60 tablet    Refill:  0    Do not place this medication, or any other prescription from our practice, on "Automatic Refill". Patient may have prescription filled one day early if pharmacy is closed on scheduled refill date.  .Marland KitchenHYDROcodone-acetaminophen (NORCO) 7.5-325 MG tablet    Sig: Take 1 tablet by mouth 2 (two) times daily as needed for moderate pain. For chronic pain To fill on or after: 10/26/2017, 11/25/2017 To last for 30 days from fill date    Dispense:  60 tablet    Refill:  0    Do not place this medication, or any other prescription from our practice, on "Automatic Refill". Patient may have prescription filled one day early if pharmacy is closed on scheduled refill date.   Lab-work, procedure(s), and/or referral(s): Orders Placed This  Encounter  Procedures  . CERVICAL FACET (MEDIAL BRANCH NERVE BLOCK)      Provider-requested follow-up: Return in about 2 weeks (around 11/09/2017) for Procedure (1-2 weeks). Time Note: Greater than 50% of the 25 minute(s) of face-to-face time spent with Judy Allen, was spent in counseling/coordination of care regarding: Judy Allen primary cause of pain, the treatment plan, treatment alternatives, the risks and possible complications of proposed treatment, medication side effects, going over the informed consent, the opioid analgesic risks and  possible complications, the results, interpretation and significance of  her recent diagnostic interventional treatment(s), the appropriate use of her medications, realistic expectations, the goals of pain management (increased in functionality) and the medication agreement. Future Appointments  Date Time Provider [place]  11/09/2017  9:30 AM Gillis Santa, MD [facility] None  11/23/2017  2:20 PM Steele Sizer, MD [facility]  11/28/2017  9:45 AM [facility] None  05/11/2018  3:00 PM Rubie Maid, MD [facility] None    Primary Care Physician: Steele Sizer, MD Location: [facility] Out[facility] Management Facility Note by: Gillis Santa, M.D Date: 10/26/2017; Time: 10:52 AM  Patient Instructions  1.  Sign opiate agreement 2.  Prescription for hydrocodone for 2 months 3.  Scheduled for repeat bilateral cervical facet medial branch nerve block #2 with sedation in 1-2 weeks.____________________________________________________________________________________________  General Risks and Possible Complications  Patient Responsibilities: It is important that you read this as it is part of your informed consent. It is our duty to inform you of the risks and possible complications associated with treatments offered to you. It is your responsibility as a patient to read this and to ask questions about anything that is not clear or that you believe was not covered in this document.  Patient's Rights: You have the right to refuse treatment. You also have the right to change your mind, even after initially having agreed to have the treatment done. However, under this last option, if you wait until the last second to change your mind, you may be charged for the materials used up to that point.  Introduction: Medicine is not an Chief Strategy Officer. Everything in Medicine, including the lack of treatment(s), carries the potential for danger, harm, or loss (which is by definition: Risk).  In Medicine, a complication is a secondary problem, condition, or disease that can aggravate an already existing one. All treatments carry the risk of possible complications. The fact that a side effects or complications occurs, does not imply that the treatment was conducted incorrectly. It must be clearly understood that these can happen even when everything is done following the highest safety standards.  No treatment: You can choose not to proceed with the proposed treatment alternative. The "PRO(s)" would include: avoiding the risk of complications associated with the therapy. The "CON(s)" would include: not getting any of the treatment benefits. These benefits fall under one of three categories: diagnostic; therapeutic; and/or palliative. Diagnostic benefits include: getting information which can ultimately lead to improvement of the disease or symptom(s). Therapeutic benefits are those associated with the successful treatment of the disease. Finally, palliative benefits are those related to the decrease of the primary symptoms, without necessarily curing the condition (example: decreasing the pain from a flare-up of a chronic condition, such as incurable terminal cancer).  General Risks and Complications: These are associated to most interventional treatments. They can occur alone, or in combination. They fall under one of the following six (6) categories: no benefit or worsening of symptoms; bleeding; infection; nerve  damage; allergic reactions; and/or death. 1. No benefits or worsening of symptoms: In Medicine there are no guarantees, only probabilities. No healthcare provider can ever guarantee that a medical treatment will work, they can only state the probability that it may. Furthermore, there is always the possibility that the condition may worsen, either directly, or indirectly, as a consequence of the treatment. 2. Bleeding: This is more common if the patient is taking a blood thinner, either  prescription or over the counter (example: Goody Powders, Fish oil, Aspirin, Garlic, etc.), or if suffering a condition associated with impaired coagulation (example: Hemophilia, cirrhosis of the liver, low platelet counts, etc.). However, even if you do not have one on these, it can still happen. If you have any of these conditions, or take one of these drugs, make sure to notify your treating physician. 3. Infection: This is more common in patients with a compromised immune system, either due to disease (example: diabetes, cancer, human immunodeficiency virus [HIV], etc.), or due to medications or treatments (example: therapies used to treat cancer and rheumatological diseases). However, even if you do not have one on these, it can still happen. If you have any of these conditions, or take one of these drugs, make sure to notify your treating physician. 4. Nerve Damage: This is more common when the treatment is an invasive one, but it can also happen with the use of medications, such as those used in the treatment of cancer. The damage can occur to small secondary nerves, or to large primary ones, such as those in the spinal cord and brain. This damage may be temporary or permanent and it may lead to impairments that can range from temporary numbness to permanent paralysis and/or brain death. 5. Allergic Reactions: Any time a substance or material comes in contact with our body, there is the possibility of an allergic reaction. These can range from a mild skin rash (contact dermatitis) to a severe systemic reaction (anaphylactic reaction), which can result in death. 6. Death: In general, any medical intervention can result in death, most of the time due to an unforeseen complication. ____________________________________________________________________________________________  Facet Blocks Patient Information  Description: The facets are joints in the spine between the vertebrae.  Like any joints in the  body, facets can become irritated and painful.  Arthritis can also effect the facets.  By injecting steroids and local anesthetic in and around these joints, we can temporarily block the nerve supply to them.  Steroids act directly on irritated nerves and tissues to reduce selling and inflammation which often leads to decreased pain.  Facet blocks may be done anywhere along the spine from the neck to the low back depending upon the location of your pain.   After numbing the skin with local anesthetic (like Novocaine), a small needle is passed onto the facet joints under x-ray guidance.  You may experience a sensation of pressure while this is being done.  The entire block usually lasts about 15-25 minutes.   Conditions which may be treated by facet blocks:   Low back/buttock pain  Neck/shoulder pain  Certain types of headaches  Preparation for the injection:  1. Do not eat any solid food or dairy products within 8 hours of your appointment. 2. You may drink clear liquid up to 3 hours before appointment.  Clear liquids include water, black coffee, juice or soda.  No milk or cream please. 3. You may take your regular medication, including pain medications, with a sip of water  before your appointment.  Diabetics should hold regular insulin (if taken separately) and take 1/2 normal NPH dose the morning of the procedure.  Carry some sugar containing items with you to your appointment. 4. A driver must accompany you and be prepared to drive you home after your procedure. 5. Bring all your current medications with you. 6. An IV may be inserted and sedation may be given at the discretion of the physician. 7. A blood pressure cuff, EKG and other monitors will often be applied during the procedure.  Some patients may need to have extra oxygen administered for a short period. 8. You will be asked to provide medical information, including your allergies and medications, prior to the procedure.  We must know  immediately if you are taking blood thinners (like Coumadin/Warfarin) or if you are allergic to IV iodine contrast (dye).  We must know if you could possible be pregnant.  Possible side-effects:   Bleeding from needle site  Infection (rare, may require surgery)  Nerve injury (rare)  Numbness & tingling (temporary)  Difficulty urinating (rare, temporary)  Spinal headache (a headache worse with upright posture)  Light-headedness (temporary)  Pain at injection site (serveral days)  Decreased blood pressure (rare, temporary)  Weakness in arm/leg (temporary)  Pressure sensation in back/neck (temporary)   Call if you experience:   Fever/chills associated with headache or increased back/neck pain  Headache worsened by an upright position  New onset, weakness or numbness of an extremity below the injection site  Hives or difficulty breathing (go to the emergency room)  Inflammation or drainage at the injection site(s)  Severe back/neck pain greater than usual  New symptoms which are concerning to you  Please note:  Although the local anesthetic injected can often make your back or neck feel good for several hours after the injection, the pain will likely return. It takes 3-7 days for steroids to work.  You may not notice any pain relief for at least one week.  If effective, we will often do a series of 2-3 injections spaced 3-6 weeks apart to maximally decrease your pain.  After the initial series, you may be a candidate for a more permanent nerve block of the facets.  If you have any questions, please call #[phone]) [facility]

## 2017-11-09 ENCOUNTER — Ambulatory Visit
Admission: RE | Admit: 2017-11-09 | Discharge: 2017-11-09 | Disposition: A | Discharge status: Home / Self Care | Payer: Medicaid Other | Source: Ambulatory Visit | Attending: Student in an Organized Health Care Education/Training Program | Admitting: Student in an Organized Health Care Education/Training Program

## 2017-11-09 ENCOUNTER — Encounter: Payer: Self-pay | Admitting: Student in an Organized Health Care Education/Training Program

## 2017-11-09 ENCOUNTER — Ambulatory Visit: Payer: Medicaid Other | Admitting: Student in an Organized Health Care Education/Training Program

## 2017-11-09 ENCOUNTER — Other Ambulatory Visit: Payer: Self-pay

## 2017-11-09 VITALS — BP 124/81 | HR 80 | Temp 97.8°F | Resp 18 | Ht 64.0 in | Wt 180.0 lb

## 2017-11-09 DIAGNOSIS — Z79899 Other long term (current) drug therapy: Secondary | ICD-10-CM | POA: Diagnosis not present

## 2017-11-09 DIAGNOSIS — M47812 Spondylosis without myelopathy or radiculopathy, cervical region: Secondary | ICD-10-CM

## 2017-11-09 DIAGNOSIS — Z882 Allergy status to sulfonamides status: Secondary | ICD-10-CM | POA: Insufficient documentation

## 2017-11-09 DIAGNOSIS — G894 Chronic pain syndrome: Secondary | ICD-10-CM | POA: Diagnosis not present

## 2017-11-09 DIAGNOSIS — Z7951 Long term (current) use of inhaled steroids: Secondary | ICD-10-CM | POA: Insufficient documentation

## 2017-11-09 DIAGNOSIS — G8929 Other chronic pain: Secondary | ICD-10-CM

## 2017-11-09 DIAGNOSIS — M542 Cervicalgia: Secondary | ICD-10-CM | POA: Insufficient documentation

## 2017-11-09 DIAGNOSIS — Z9049 Acquired absence of other specified parts of digestive tract: Secondary | ICD-10-CM | POA: Diagnosis not present

## 2017-11-09 DIAGNOSIS — F419 Anxiety disorder, unspecified: Secondary | ICD-10-CM | POA: Insufficient documentation

## 2017-11-09 DIAGNOSIS — Z888 Allergy status to other drugs, medicaments and biological substances status: Secondary | ICD-10-CM | POA: Diagnosis not present

## 2017-11-09 MED ORDER — ROPIVACAINE HCL 2 MG/ML IJ SOLN
10.0000 mL | Freq: Once | INTRAMUSCULAR | Status: AC
  Administered 2017-11-09: 10 mL
  Filled 2017-11-09: qty 10

## 2017-11-09 MED ORDER — FENTANYL CITRATE (PF) 100 MCG/2ML IJ SOLN
25.0000 ug | INTRAMUSCULAR | Status: DC | PRN
  Administered 2017-11-09: 50 ug via INTRAVENOUS
  Filled 2017-11-09: qty 2

## 2017-11-09 MED ORDER — ROPIVACAINE HCL 2 MG/ML IJ SOLN
INTRAMUSCULAR | Status: AC
Start: 2017-11-09 — End: 2017-11-09
  Filled 2017-11-09: qty 10

## 2017-11-09 MED ORDER — LACTATED RINGERS IV SOLN
1000.0000 mL | Freq: Once | INTRAVENOUS | Status: AC
  Administered 2017-11-09: 1000 mL via INTRAVENOUS

## 2017-11-09 MED ORDER — LIDOCAINE HCL 1 % IJ SOLN
10.0000 mL | Freq: Once | INTRAMUSCULAR | Status: AC
  Administered 2017-11-09: 5 mL
  Filled 2017-11-09: qty 10

## 2017-11-09 MED ORDER — DEXAMETHASONE SODIUM PHOSPHATE 10 MG/ML IJ SOLN
10.0000 mg | Freq: Once | INTRAMUSCULAR | Status: AC
  Administered 2017-11-09: 10 mg
  Filled 2017-11-09: qty 1

## 2017-11-09 NOTE — Progress Notes (Signed)
Safety precautions to be maintained throughout the outpatient stay will include: orient to surroundings, keep bed in low position, maintain call bell within reach at all times, provide assistance with transfer out of bed and ambulation.  

## 2017-11-09 NOTE — Patient Instructions (Signed)

## 2017-11-09 NOTE — Progress Notes (Signed)
Patient's Name: Judy Allen  MRN: [id number]  Referring Provider: Steele Sizer, MD  DOB: [birth year]  PCP: Steele Sizer, MD  DOS: 11/09/2017  Note by: Gillis Santa, MD  Service setting: Ambulatory outpatient  Specialty: Interventional Pain Management  Patient type: Established  Location: [facility] (AMB) Pain Management Facility  Visit type: Interventional Procedure   Primary Reason for Visit: Interventional Pain Management Treatment. CC: Neck Pain  Procedure:  Anesthesia, Analgesia, Anxiolysis:  Type: Diagnostic Cervical Facet Medial Branch Block(s) #2  Region: Posterolateral cervical spine region Level:  C4, C5, C6, & C7 Medial Branch Level(s) Laterality: Bilateral Paraspinal  Type: Local Anesthesia with Moderate (Conscious) Sedation Local Anesthetic: Lidocaine 1% Route: Intravenous (IV) IV Access: Secured Sedation: Meaningful verbal contact was maintained at all times during the procedure  Indication(s): Analgesia and Anxiety   Indications: 1. Spondylosis of cervical region without myelopathy or radiculopathy   2. Cervical facet joint syndrome   3. Chronic cervical pain   4. Chronic pain syndrome    Pain Score: Pre-procedure: 8 /10 Post-procedure: 0-No pain/10  Pre-op Assessment:  Judy Allen is a 61 y.o. (year old), female patient, seen today for interventional treatment. She  has a past surgical history that includes Tubal ligation; tonsillectomy (9629); herniated disc repair (2001); Anterior fusion lumbar spine (1990); Neck surgery; Mass excision (Left, 02/23/2016); Colonoscopy (2018); Portacath placement (Left, 03/12/2016); Cardiac catheterization (2009? ); Cardiac catheterization (05/2012); Port-a-cath removal (2018); Liver biopsy (N/A, 09/12/2017); and Cholecystectomy (N/A, 09/12/2017). Judy Allen has a current medication list which includes the following prescription(s): albuterol, betamethasone dipropionate, divalproex, gabapentin, hydrochlorothiazide, hydrocodone-acetaminophen,  hydrocortisone, ketoconazole, metoprolol succinate, mirabegron er, omega-3 acid ethyl esters, omeprazole, oxygen-helium, tizanidine, venlafaxine xr, cvs b-12, oxybutynin, and tiotropium, and the following Facility-Administered Medications: fentanyl. Her primarily concern today is the Neck Pain  Initial Vital Signs:  Pulse Rate: 80 Temp: 98 F (36.7 C) Resp: 16 BP: 122/78 SpO2: 95 %  BMI: Estimated body mass index is 30.9 kg/m as calculated from the following:   Height as of this encounter: 5\' 4"  (1.626 m).   Weight as of this encounter: 180 lb (81.6 kg).  Risk Assessment: Allergies: Reviewed. She is allergic to dapsone and sulfa antibiotics.  Allergy Precautions: None required Coagulopathies: Reviewed. None identified.  Blood-thinner therapy: None at this time Active Infection(s): Reviewed. None identified. Judy Allen is afebrile  Site Confirmation: Judy Allen was asked to confirm the procedure and laterality before marking the site Procedure checklist: Completed Consent: Before the procedure and under the influence of no sedative(s), amnesic(s), or anxiolytics, the patient was informed of the treatment options, risks and possible complications. To fulfill our ethical and legal obligations, as recommended by the American Medical Association's Code of Ethics, I have informed the patient of my clinical impression; the nature and purpose of the treatment or procedure; the risks, benefits, and possible complications of the intervention; the alternatives, including doing nothing; the risk(s) and benefit(s) of the alternative treatment(s) or procedure(s); and the risk(s) and benefit(s) of doing nothing. The patient was provided information about the general risks and possible complications associated with the procedure. These may include, but are not limited to: failure to achieve desired goals, infection, bleeding, organ or nerve damage, allergic reactions, paralysis, and death. In addition, the  patient was informed of those risks and complications associated to Spine-related procedures, such as failure to decrease pain; infection (i.e.: Meningitis, epidural or intraspinal abscess); bleeding (i.e.: epidural hematoma, subarachnoid hemorrhage, or any other type of intraspinal or peri-dural bleeding); organ or nerve  damage (i.e.: Any type of peripheral nerve, nerve root, or spinal cord injury) with subsequent damage to sensory, motor, and/or autonomic systems, resulting in permanent pain, numbness, and/or weakness of one or several areas of the body; allergic reactions; (i.e.: anaphylactic reaction); and/or death. Furthermore, the patient was informed of those risks and complications associated with the medications. These include, but are not limited to: allergic reactions (i.e.: anaphylactic or anaphylactoid reaction(s)); adrenal axis suppression; blood sugar elevation that in diabetics may result in ketoacidosis or comma; water retention that in patients with history of congestive heart failure may result in shortness of breath, pulmonary edema, and decompensation with resultant heart failure; weight gain; swelling or edema; medication-induced neural toxicity; particulate matter embolism and blood vessel occlusion with resultant organ, and/or nervous system infarction; and/or aseptic necrosis of one or more joints. Finally, the patient was informed that Medicine is not an exact science; therefore, there is also the possibility of unforeseen or unpredictable risks and/or possible complications that may result in a catastrophic outcome. The patient indicated having understood very clearly. We have given the patient no guarantees and we have made no promises. Enough time was given to the patient to ask questions, all of which were answered to the patient's satisfaction. Judy Allen has indicated that she wanted to continue with the procedure. Attestation: I, the ordering provider, attest that I have discussed  with the patient the benefits, risks, side-effects, alternatives, likelihood of achieving goals, and potential problems during recovery for the procedure that I have provided informed consent. Date  Time: 11/09/2017  9:27 AM  Pre-Procedure Preparation:  Monitoring: As per clinic protocol. Respiration, ETCO2, SpO2, BP, heart rate and rhythm monitor placed and checked for adequate function Safety Precautions: Patient was assessed for positional comfort and pressure points before starting the procedure. Time-out: I initiated and conducted the "Time-out" before starting the procedure, as per protocol. The patient was asked to participate by confirming the accuracy of the "Time Out" information. Verification of the correct person, site, and procedure were performed and confirmed by me, the nursing staff, and the patient. "Time-out" conducted as per Joint Commission's Universal Protocol (UP.01.01.01). Time: 1013  Description of Procedure Process:   Position: Prone with head of the table was raised to facilitate breathing. Target Area: For Cervical Facet blocks, the target is the postero-lateral waist of the articular pillars at the C4, C5, C6, & C7 levels. Approach: Posterior approach. Area Prepped: Entire Posterior Cervico-thoracic Region Prepping solution: ChloraPrep (2% chlorhexidine gluconate and 70% isopropyl alcohol) Safety Precautions: Aspiration looking for blood return was conducted prior to all injections. At no point did we inject any substances, as a needle was being advanced. No attempts were made at seeking any paresthesias. Safe injection practices and needle disposal techniques used. Medications properly checked for expiration dates. SDV (single dose vial) medications used. Description of the Procedure: Protocol guidelines were followed. The patient was placed in position over the fluoroscopy table. The target area was identified and the area prepped in the usual manner. Skin desensitized  using vapocoolant spray. Skin & deeper tissues infiltrated with local anesthetic. Appropriate amount of time allowed to pass for local anesthetics to take effect. The procedure needle was introduced through the skin, ipsilateral to the reported pain, and advanced to the target area. Bone was contacted on the posterior aspect of the articular pillars and the needle walked lateral, until the border was cleared. Lateral views taken to make sure the needle tip did not advance past the posterior third  of the lateral mass of the posterior columns. The procedure was repeated in identical fashion for each level. Negative aspiration confirmed. Solution injected in intermittent fashion, asking for systemic symptoms every 0.5cc of injectate. The needles were then removed and the area cleansed, making sure to leave some of the prepping solution back to take advantage of its long term bactericidal properties. Vitals:   11/09/17 1035 11/09/17 1045 11/09/17 1055 11/09/17 1105  BP: 130/88 121/78 122/73 124/81  Pulse:      Resp: 18 17 18 18   Temp:  98 F (36.7 C)  97.8 F (36.6 C)  TempSrc:      SpO2: 96% 93% 93% 93%  Weight:      Height:        Start Time: 1014 hrs. End Time: 1035 hrs. Materials:  Needle(s) Type: Regular needle Gauge: 22G Length: 3.5-in Medication(s): Please see orders for medications and dosing details. 10 cc solution made of 9 cc of 0.2% ropivacaine, 1 cc of Decadron 10 mg/cc.  1 cc injected at each level. Imaging Guidance (Spinal):  Type of Imaging Technique: Fluoroscopy Guidance (Spinal) Indication(s): Assistance in needle guidance and placement for procedures requiring needle placement in or near specific anatomical locations not easily accessible without such assistance. Exposure Time: Please see nurses notes. Contrast: None used. Fluoroscopic Guidance: I was personally present during the use of fluoroscopy. "Tunnel Vision Technique" used to obtain the best possible view of the  target area. Parallax error corrected before commencing the procedure. "Direction-depth-direction" technique used to introduce the needle under continuous pulsed fluoroscopy. Once target was reached, antero-posterior, oblique, and lateral fluoroscopic projection used confirm needle placement in all planes. Images permanently stored in EMR. Interpretation: No contrast injected. I personally interpreted the imaging intraoperatively. Adequate needle placement confirmed in multiple planes. Permanent images saved into the patient's record.  Antibiotic Prophylaxis:   Anti-infectives (From admission, onward)   None     Indication(s): None identified  Post-operative Assessment:  Post-procedure Vital Signs:  Pulse Rate: 80 Temp: 97.8 F (36.6 C) Resp: 18 BP: 124/81 SpO2: 93 %  EBL: None  Complications: No immediate post-treatment complications observed by team, or reported by patient.  Note: The patient tolerated the entire procedure well. A repeat set of vitals were taken after the procedure and the patient was kept under observation following institutional policy, for this type of procedure. Post-procedural neurological assessment was performed, showing return to baseline, prior to discharge. The patient was provided with post-procedure discharge instructions, including a section on how to identify potential problems. Should any problems arise concerning this procedure, the patient was given instructions to immediately contact us, at any time, without hesitation. In any case, we plan to contact the patient by telephone for a follow-up status report regarding this interventional procedure.  Comments:  No additional relevant information. 5 out of 5 strength bilateral upper extremity: Shoulder abduction, elbow flexion, elbow extension, thumb extension.  Plan of Care    Imaging Orders     DG C-Arm 1-60 Min-No Report Procedure Orders    No procedure(s) ordered today    Medications ordered  for procedure: Meds ordered this encounter  Medications  . lactated ringers infusion 1,000 mL  . fentaNYL (SUBLIMAZE) injection 25-100 mcg    Make sure Narcan is available in the pyxis when using this medication. In the event of respiratory depression (RR< 8/min): Titrate NARCAN (naloxone) in increments of 0.1 to 0.2 mg IV at 2-3 minute intervals, until desired degree of reversal.  . lidocaine (  XYLOCAINE) 1 % (with pres) injection 10 mL  . ropivacaine (PF) 2 mg/mL (0.2%) (NAROPIN) injection 10 mL  . dexamethasone (DECADRON) injection 10 mg   Medications administered: We administered lactated ringers, fentaNYL, lidocaine, ropivacaine (PF) 2 mg/mL (0.2%), and dexamethasone.  See the medical record for exact dosing, route, and time of administration.  New Prescriptions   No medications on file   Disposition: Discharge home  Discharge Date & Time: 11/09/2017; 1115 hrs.   Physician-requested Follow-up: Return in about 1 month (around 12/07/2017) for Post Procedure Evaluation.  Future Appointments  Date Time Provider [place]  11/23/2017  2:20 PM Steele Sizer, MD [facility]  11/28/2017  9:45 AM [facility] None  12/06/2017 12:15 PM Gillis Santa, MD [facility] None  05/11/2018  3:00 PM Rubie Maid, MD [facility] None   Primary Care Physician: Steele Sizer, MD Location: [facility] Note by: Gillis Santa, MD Date: 11/09/2017; Time: 11:27 AM  Disclaimer:  Medicine is not an exact science. The only guarantee in medicine is that nothing is guaranteed. It is important to note that the decision to proceed with this intervention was based on the information collected from the patient. The Data and conclusions were drawn from the patient's questionnaire, the interview, and the physical examination. Because the information was provided in large part by the patient, it cannot be guaranteed that it has not been purposely or unconsciously  manipulated. Every effort has been made to obtain as much relevant data as possible for this evaluation. It is important to note that the conclusions that lead to this procedure are derived in large part from the available data. Always take into account that the treatment will also be dependent on availability of resources and existing treatment guidelines, considered by other Pain Management Practitioners as being common knowledge and practice, at the time of the intervention. For Medico-Legal purposes, it is also important to point out that variation in procedural techniques and pharmacological choices are the acceptable norm. The indications, contraindications, technique, and results of the above procedure should only be interpreted and judged by a Board-Certified Interventional Pain Specialist with extensive familiarity and expertise in the same exact procedure and technique.

## 2017-11-10 ENCOUNTER — Telehealth: Payer: Self-pay

## 2017-11-10 NOTE — Telephone Encounter (Signed)
Post procedure phone call.  Left message.  

## 2017-11-20 ENCOUNTER — Other Ambulatory Visit: Payer: Self-pay | Admitting: Family Medicine

## 2017-11-20 DIAGNOSIS — M542 Cervicalgia: Principal | ICD-10-CM

## 2017-11-20 DIAGNOSIS — G8929 Other chronic pain: Secondary | ICD-10-CM

## 2017-11-21 NOTE — Telephone Encounter (Signed)
Refill request for general medication. Tizanidine to [facility].   Last office visit: 08/22/2017   Follow up on 11/23/2017

## 2017-11-23 ENCOUNTER — Ambulatory Visit: Payer: Medicaid Other | Admitting: Family Medicine

## 2017-11-23 ENCOUNTER — Encounter: Payer: Self-pay | Admitting: Family Medicine

## 2017-11-23 VITALS — BP 110/80 | HR 92 | Resp 16 | Ht 64.0 in | Wt 181.8 lb

## 2017-11-23 DIAGNOSIS — G8929 Other chronic pain: Secondary | ICD-10-CM

## 2017-11-23 DIAGNOSIS — F339 Major depressive disorder, recurrent, unspecified: Secondary | ICD-10-CM | POA: Diagnosis not present

## 2017-11-23 DIAGNOSIS — K219 Gastro-esophageal reflux disease without esophagitis: Secondary | ICD-10-CM | POA: Diagnosis not present

## 2017-11-23 DIAGNOSIS — E785 Hyperlipidemia, unspecified: Secondary | ICD-10-CM

## 2017-11-23 DIAGNOSIS — I479 Paroxysmal tachycardia, unspecified: Secondary | ICD-10-CM

## 2017-11-23 DIAGNOSIS — M542 Cervicalgia: Secondary | ICD-10-CM | POA: Diagnosis not present

## 2017-11-23 DIAGNOSIS — E8881 Metabolic syndrome: Secondary | ICD-10-CM | POA: Diagnosis not present

## 2017-11-23 DIAGNOSIS — R42 Dizziness and giddiness: Secondary | ICD-10-CM | POA: Diagnosis not present

## 2017-11-23 DIAGNOSIS — Z9981 Dependence on supplemental oxygen: Secondary | ICD-10-CM | POA: Diagnosis not present

## 2017-11-23 DIAGNOSIS — D692 Other nonthrombocytopenic purpura: Secondary | ICD-10-CM

## 2017-11-23 DIAGNOSIS — E781 Pure hyperglyceridemia: Secondary | ICD-10-CM | POA: Diagnosis not present

## 2017-11-23 DIAGNOSIS — J301 Allergic rhinitis due to pollen: Secondary | ICD-10-CM

## 2017-11-23 DIAGNOSIS — J432 Centrilobular emphysema: Secondary | ICD-10-CM

## 2017-11-23 DIAGNOSIS — I10 Essential (primary) hypertension: Secondary | ICD-10-CM

## 2017-11-23 MED ORDER — METOPROLOL SUCCINATE ER 50 MG PO TB24: 50.0000 mg | ORAL_TABLET | Freq: Every day | ORAL | 5 refills | Status: DC

## 2017-11-23 MED ORDER — HYDROCHLOROTHIAZIDE 25 MG PO TABS: 25.0000 mg | ORAL_TABLET | Freq: Every day | ORAL | 5 refills | Status: DC

## 2017-11-23 MED ORDER — OMEPRAZOLE 40 MG PO CPDR: 40.0000 mg | DELAYED_RELEASE_CAPSULE | Freq: Every day | ORAL | 5 refills | Status: DC

## 2017-11-23 MED ORDER — LORATADINE 10 MG PO TABS: 10.0000 mg | ORAL_TABLET | Freq: Every day | ORAL | 5 refills | Status: DC

## 2017-11-23 NOTE — Progress Notes (Signed)
Name: Judy Allen   MRN: [id number]    DOB: Mar 21, 1956   Date:11/23/2017       Progress Note  Subjective  Chief Complaint  Chief Complaint  Patient presents with  . Hypertension  . Dizziness  . Hyperlipidemia    HPI   HTN:she is now back on HCTZ 12.5 mg and Toprol XL she is doing well at this time, bp is towards low end of normal, but no lightheadedness , no chest pain or palpitation. She has noticed some spinning sensation occasionally , it can happen while sitting, denies associated hearing loss or tinnitus.   RUQ pain: she had a cholecystectomy and liver biopsy done 09/2017, no cancer however she has steathohepatitis, she was advised to follow a healthy diet.   Neck pain: neck pain isstill present still having constantCurrently under the care of Dr. Holley Raring, pain management. She had two cervical spinal blocks, first procedure improved symptoms, however second did not . She is taking Tizanidine and also hydrocodone.   Chronic low back pain: she was seen by [facility], history of lumbar spine surgery and had injections by Dr. Mauri Pole  in the past with improvement of symptoms, no radiculitis, worse when bending forward. Taking hydrocodone and also muscle relaxer and last drug screen was done 05/2016, she states pain is worse with activity.   GAD/Depression Major: currently on Effexor and Depakote because Abilify and Cymbalta was too expenisve- no longer could afford. She denies suicidal thoughts or ideation. She has anhedonia. Marland Kitchen She was referred to psychiatrist but cancelled appointment because she was feeling better, not interested in counseling at this time.  Anal Squamous cell Carcinoma: seeing oncologist and radiation oncologist at [facility]. She has follow up at [facility] every 3 months for 2 years, either with oncologist or radiation oncologist, complication from radiation therapy was incontinence, seeing urologist and and got insurance approval for myrbetriq and is doing  better, occasional leakage, but much better now  Emphysema: she used to smoke for many years, but quit 10 years ago, but was admitted to [facility] in October 2017 , had CT chest to rule out PE and was found to have centrilobular emphysema and also hypoxemia, she was sent home on 2liters of oxygen since  She goes to [facility] now, no longer seeing Dr. Mortimer Fries, she uses Symbicort only when wheezing and spiriva daily   OSA ;diagnosed at [facility], on 8 cm H2 O pressure, but not compliant with CPAP , she states it was bothering her sleep and pulse ox was still low during the night. She states she also has RLS and is already on gabapentin She states pulmonologist is trying to change the machine  Dysmetabolic syndrome: avoiding sweets, no polyphagia, polydipsia but has polyuria  Dyslipidemia: only on [place], we will recheck levels   Patient Active Problem List   Diagnosis Date Noted  . 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 Abnormal liver function test 09/27/2017  . Fatty liver 12/22/2016  . Chronic respiratory failure with hypoxia, on home oxygen therapy ([place]) 07/28/2016  . Centrilobular emphysema ([place]) 07/14/2016  . Abnormal Pap smear of cervix 04/14/2016  . Anal squamous cell carcinoma ([place]) 03/09/2016  . Metastatic squamous cell carcinoma ([place]) 02/25/2016  . Non-thrombocytopenic purpura ([place]) 01/12/2016  . Chronic constipation 10/16/2015  . History of fusion of cervical spine 04/02/2015  . Benign hypertension 01/16/2015  . Chronic cervical pain 01/16/2015  . CN (constipation) 01/16/2015  . Gastric reflux 01/16/2015  . Hypertriglyceridemia 01/16/2015  .  Edema leg 01/16/2015  . Chronic recurrent major depressive disorder ([place]) 01/16/2015  . Dysmetabolic syndrome 41/32/4401  . Obstructive apnea 01/16/2015  . Psoriasis 01/16/2015  . Allergic rhinitis 01/16/2015  . Bursitis, trochanteric 01/16/2015  . GAD (generalized anxiety disorder) 01/16/2015  . Tachycardia, paroxysmal  [facility])     Past Surgical History:  Procedure Laterality Date  . ANTERIOR FUSION LUMBAR SPINE  1990   plate in back, not metal  . CARDIAC CATHETERIZATION  2009?    [facility];[place]  . CARDIAC CATHETERIZATION  05/2012   [facility]: Mild hypertension 37/12 with a mean pressure of 21 mm mercury  . CHOLECYSTECTOMY N/A 09/12/2017   Procedure: LAPAROSCOPIC CHOLECYSTECTOMY WITH INTRAOPERATIVE CHOLANGIOGRAM;  Surgeon: Robert Bellow, MD;  Location: [facility] ORS;  Service: General;  Laterality: N/A;  . COLONOSCOPY  2018  . herniated disc repair  2001  . LIVER BIOPSY N/A 09/12/2017   Procedure: LIVER BIOPSY;  Surgeon: Robert Bellow, MD;  Location: [facility] ORS;  Service: General;  Laterality: N/A;  . MASS EXCISION Left 02/23/2016   Procedure: EXCISION MASS;  Surgeon: Christene Lye, MD;  Location: [facility] ORS;  Service: General;  Laterality: Left;  . NECK SURGERY    . PORT-A-CATH REMOVAL  2018  . PORTACATH PLACEMENT Left 03/12/2016   Procedure: INSERTION PORT-A-CATH;  Surgeon: Christene Lye, MD;  Location: [facility] ORS;  Service: General;  Laterality: Left;  . tonsillectomy  1959   history  . TUBAL LIGATION     s/p    Family History  Problem Relation Age of Onset  . Heart attack Mother   . Asthma Mother   . Liver cancer Father   . Breast cancer Maternal Aunt   . Bladder Cancer Neg Hx   . Kidney cancer Neg Hx     Social History   Socioeconomic History  . Marital status: Married    Spouse name: Not on file  . Number of children: Not on file  . Years of education: Not on file  . Highest education level: Not on file  Occupational History  . Not on file  Social Needs  . Financial resource strain: Not on file  . Food insecurity:    Worry: Not on file    Inability: Not on file  . Transportation needs:    Medical: Not on file    Non-medical: Not on file  Tobacco Use  . Smoking status: Former Smoker    Packs/day: 1.00    Years: 32.00    Pack years: 32.00    Types: Cigarettes    Start  date: 08/10/1963    Last attempt to quit: 01/15/2006    Years since quitting: 11.8  . Smokeless tobacco: Never Used  Substance and Sexual Activity  . Alcohol use: No    Alcohol/week: 0.0 oz  . Drug use: No  . Sexual activity: Never    Birth control/protection: Post-menopausal  Lifestyle  . Physical activity:    Days per week: Not on file    Minutes per session: Not on file  . Stress: Not on file  Relationships  . Social connections:    Talks on phone: Not on file    Gets together: Not on file    Attends religious service: Not on file    Active member of club or organization: Not on file    Attends meetings of clubs or organizations: Not on file    Relationship status: Not on file  . Intimate partner violence:    Fear of  current or ex partner: Not on file    Emotionally abused: Not on file    Physically abused: Not on file    Forced sexual activity: Not on file  Other Topics Concern  . Not on file  Social History Narrative  . Not on file     Current Outpatient Medications:  .  albuterol (PROVENTIL HFA;VENTOLIN HFA) 108 (90 Base) MCG/ACT inhaler, Inhale 2 puffs into the lungs every 6 (six) hours as needed for wheezing or shortness of breath., Disp: , Rfl:  .  betamethasone dipropionate (DIPROLENE) 0.05 % cream, Apply 1 application topically 2 (two) times daily as needed., Disp: , Rfl:  .  budesonide-formoterol (SYMBICORT) 160-4.5 MCG/ACT inhaler, Inhale 2 puffs into the lungs 2 (two) times daily., Disp: , Rfl:  .  divalproex (DEPAKOTE) 250 MG DR tablet, Take 1 tablet (250 mg total) by mouth at bedtime., Disp: 30 tablet, Rfl: 5 .  fluticasone (FLONASE) 50 MCG/ACT nasal spray, Place 2 sprays into the nose daily., Disp: , Rfl:  .  gabapentin (NEURONTIN) 300 MG capsule, Take 1 capsule (300 mg total) by mouth 3 (three) times daily., Disp: 90 capsule, Rfl: 3 .  hydrochlorothiazide (HYDRODIURIL) 25 MG tablet, Take 1 tablet (25 mg total) by mouth daily., Disp: 30 tablet, Rfl: 5 .   HYDROcodone-acetaminophen (NORCO) 7.5-325 MG tablet, Take 1 tablet by mouth 2 (two) times daily as needed for moderate pain. For chronic pain To fill on or after: 10/26/2017, 11/25/2017 To last for 30 days from fill date, Disp: 60 tablet, Rfl: 0 .  hydrocortisone 2.5 % cream, Apply 1 application topically 2 (two) times daily., Disp: , Rfl:  .  ketoconazole (NIZORAL) 2 % cream, Apply 1 application topically 2 (two) times daily., Disp: , Rfl:  .  metoprolol succinate (TOPROL-XL) 50 MG 24 hr tablet, Take 1 tablet (50 mg total) by mouth daily., Disp: 30 tablet, Rfl: 5 .  mirabegron ER (MYRBETRIQ) 50 MG TB24 tablet, Take 1 tablet (50 mg total) by mouth daily., Disp: 30 tablet, Rfl: 11 .  omega-3 acid ethyl esters (LOVAZA) 1 g capsule, Take 2 capsules (2 g total) 2 (two) times daily by mouth., Disp: 120 capsule, Rfl: 5 .  omeprazole (PRILOSEC) 40 MG capsule, Take 40 mg by mouth daily. , Disp: , Rfl: 5 .  OXYGEN, Inhale 2 L into the lungs continuous. , Disp: , Rfl:  .  tiZANidine (ZANAFLEX) 4 MG tablet, Take 1 tablet (4 mg total) by mouth every 8 (eight) hours as needed., Disp: 15 tablet, Rfl: 0 .  venlafaxine XR (EFFEXOR-XR) 150 MG 24 hr capsule, Take 1 capsule (150 mg total) by mouth daily with breakfast., Disp: 30 capsule, Rfl: 5 .  tiotropium (SPIRIVA) 18 MCG inhalation capsule, Place 18 mcg into inhaler and inhale daily. , Disp: , Rfl:   Allergies  Allergen Reactions  . Dapsone Other (See Comments)    Hypoxemia  . Sulfa Antibiotics Rash  . Sulfasalazine Rash     ROS  Constitutional: Negative for fever or weight change.  Respiratory: positive  for cough and shortness of breath.   Cardiovascular: Negative for chest pain or palpitations.  Gastrointestinal: Negative for abdominal pain, no bowel changes.  Musculoskeletal: Negative for gait problem or joint swelling.  Skin: Negative for rash.  Neurological: Negative for dizziness or headache.  No other specific complaints in a complete review of  systems (except as listed in HPI above).  Objective  Vitals:   11/23/17 1418  BP: 110/80  Pulse:  92  Resp: 16  SpO2: 98%  Weight: 181 lb 12.8 oz (82.5 kg)  Height: 5\' 4"  (1.626 m)    Body mass index is 31.21 kg/m.  Physical Exam  Constitutional: Patient appears well-developed and well-nourished. Obese  No distress.  HEENT: head atraumatic, normocephalic, pupils equal and reactive to light,  neck supple, throat within normal limits Cardiovascular: Normal rate, regular rhythm and normal heart sounds.  No murmur heard. No BLE edema. Pulmonary/Chest: Effort normal and breath sounds normal. No respiratory distress. Abdominal: Soft.  There is no tenderness. Psychiatric: Patient has a normal mood and affect. behavior is normal. Judgment and thought content normal.  Recent Results (from the past 2160 hour(s))  Hepatic function panel     Status: Abnormal   Collection Time: 08/29/17 12:07 PM  Result Value Ref Range   Total Protein 7.7 6.1 - 8.1 g/dL   Albumin 4.9 3.6 - 5.1 g/dL   Globulin 2.8 1.9 - 3.7 g/dL (calc)   AG Ratio 1.8 1.0 - 2.5 (calc)   Total Bilirubin 0.4 0.2 - 1.2 mg/dL   Bilirubin, Direct 0.1 0.0 - 0.2 mg/dL   Indirect Bilirubin 0.3 0.2 - 1.2 mg/dL (calc)   Alkaline phosphatase (APISO) 72 33 - 130 U/L   AST 83 (H) 10 - 35 U/L   ALT 103 (H) 6 - 29 U/L  Lipid panel     Status: Abnormal   Collection Time: 08/29/17 12:07 PM  Result Value Ref Range   Cholesterol 229 (H) <200 mg/dL   HDL 40 (L) >50 mg/dL   Triglycerides 548 (H) <150 mg/dL    Comment: Verified by repeat analysis. Marland Kitchen    LDL Cholesterol (Calc)  mg/dL (calc)    Comment: . LDL cholesterol not calculated. Triglyceride levels greater than 400 mg/dL invalidate calculated LDL results. . Reference range: <100 . Desirable range <100 mg/dL for primary prevention;   <70 mg/dL for patients with CHD or diabetic patients  with > or = 2 CHD risk factors. Marland Kitchen LDL-C is now calculated using the Martin-Hopkins   calculation, which is a validated novel method providing  better accuracy than the Friedewald equation in the  estimation of LDL-C.  [name]. 2951;884(16): 2061-2068  (http://education.[employer].com/faq/FAQ164)    Total CHOL/HDL Ratio 5.7 (H) <5.0 (calc)   Non-HDL Cholesterol (Calc) 189 (H) <130 mg/dL (calc)    Comment: For patients with diabetes plus 1 major ASCVD risk  factor, treating to a non-HDL-C goal of <100 mg/dL  (LDL-C of <70 mg/dL) is considered a therapeutic  option.   Hemoglobin A1c     Status: Abnormal   Collection Time: 08/29/17 12:07 PM  Result Value Ref Range   Hgb A1c MFr Bld 6.1 (H) <5.7 % of total Hgb    Comment: For someone without known diabetes, a hemoglobin  A1c value between 5.7% and 6.4% is consistent with prediabetes and should be confirmed with a  follow-up test. . For someone with known diabetes, a value <7% indicates that their diabetes is well controlled. A1c targets should be individualized based on duration of diabetes, age, comorbid conditions, and other considerations. . This assay result is consistent with an increased risk of diabetes. . Currently, no consensus exists regarding use of hemoglobin A1c for diagnosis of diabetes for children. .    Mean Plasma Glucose 128 (calc)   eAG (mmol/L) 7.1 (calc)  Compliance Drug Analysis, Ur     Status: None   Collection Time: 09/07/17 11:51 AM  Result Value  Ref Range   Summary FINAL     Comment: ==================================================================== [facility] COMP DRUG ANALYSIS,UR ==================================================================== Test                             Result       Flag       Units Drug Present and Declared for Prescription Verification   Norhydrocodone                 189          EXPECTED   ng/mg creat    Norhydrocodone is an expected metabolite of hydrocodone.   Gabapentin                     PRESENT      EXPECTED    Venlafaxine                    PRESENT      EXPECTED   Desmethylvenlafaxine           PRESENT      EXPECTED    Desmethylvenlafaxine is an expected metabolite of venlafaxine.   Acetaminophen                  PRESENT      EXPECTED   Metoprolol                     PRESENT      EXPECTED Drug Absent but Declared for Prescription Verification   Hydrocodone                    Not Detected UNEXPECTED ng/mg creat    Hydrocodone is almost always present in patients taking this drug    consistently. Absence of hydr ocodone could be due to lapse of    time since the last dose or unusual pharmacokinetics (rapid    metabolism).   Tizanidine                     Not Detected UNEXPECTED    Tizanidine, as indicated in the declared medication list, is not    always detected even when used as directed. ==================================================================== Test                      Result    Flag   Units      Ref Range   Creatinine              35               mg/dL      >=20 ==================================================================== Declared Medications:  The flagging and interpretation on this report are based on the  following declared medications.  Unexpected results may arise from  inaccuracies in the declared medications.  **Note: The testing scope of this panel includes these medications:  Gabapentin  Hydrocodone (Hydrocodone-Acetaminophen)  Metoprolol  Venlafaxine  **Note: The testing scope of this panel does not include small to  moderate amounts of th ese reported medications:  Acetaminophen (Hydrocodone-Acetaminophen)  Tizanidine (Zanaflex)  **Note: The testing scope of this panel does not include following  reported medications:  Albuterol  Betamethasone (Diprolene)  Cyanocobalamin  Divalproex (Depakote)  Hydrochlorothiazide  Hydrocortisone  Ketoconazole (Nizoral)  Omeprazole (Prilosec)  Oxybutynin  Oxygen  Supplement (Omega-3)  Tiotropium (Spiriva)   Tolterodine (Detrol LA) ==================================================================== For clinical consultation, please call [phone]. ====================================================================  Glucose, capillary     Status: Abnormal   Collection Time: 09/12/17  9:37 AM  Result Value Ref Range   Glucose-Capillary 121 (H) 65 - 99 mg/dL  Surgical pathology     Status: None   Collection Time: 09/12/17 10:21 AM  Result Value Ref Range   SURGICAL PATHOLOGY      Surgical Pathology CASE: [id number] PATIENT: Isabela Auvil Surgical Pathology Report     SPECIMEN SUBMITTED: A. Gallbladder B. Liver, right lobe; bx  CLINICAL HISTORY: None provided  PRE-OPERATIVE DIAGNOSIS: Gallbladder polyp  POST-OPERATIVE DIAGNOSIS: Same as pre-op     DIAGNOSIS: A. GALLBLADDER; CHOLECYSTECTOMY: - CHRONIC CHOLECYSTITIS. - NEGATIVE FOR MALIGNANCY.  B. LIVER, RIGHT LOBE; NEEDLE BIOPSY: - MODERATE STEATOHEPATITIS, GRADE 2 (OF 3), STAGE 1 (BRUNT CLASSIFICATION).  Comment: In specimen A, a polyp is not identified on gross or microscopic examination. These findings were discussed with Dr. Bary Castilla on 09/14/2017.  Specimen B: EHR is reviewed. Patient has a recent progressive elevation of AST and ALT. AST/ALT have been mildly elevated over the past couple years with occasional return to normal. In 2017 ANA was weakly positive. Total protein levels have been normal / low. Sections demonstrate intact cores of hepatic parenchyma with adeq uate numbers of portal tracts available for interpretation. Macrovesicular steatosis involves approximately 50% of the parenchyma and predominantly involves zones 1 and 2.  Areas of hepatocyte injury are noted in areas of steatosis. The trichrome stain highlights focal pericellular fibrosis in areas of steatohepatitis, consistent with stage 1 fibrosis. Fibrous portal expansion is not present. The majority of the portal tracts contain a  mild to focally moderate lymphocytic infiltrate with occasional plasma cells and rare eosinophils. Minimal interface hepatitis / piecemeal necrosis is identified. Mild bile ductular reaction is present. Bile ducts are intact. Cholestasis is not identified. PASD highlights ceroid containing material in sinusoidal Kupffer cells and portal histiocytes consistent with ongoing hepatitic process. Intrahepatocyte PASD globules are not identified. There is no stainable iron. Stain controls worked appropriately. Overall, the findings are consisten t with moderate steatohepatitis. Features of autoimmune hepatitis are not identified in this biopsy. Recommend viral hepatitis serologies if they have not already been completed.   GROSS DESCRIPTION:  A. Labeled: gallbladder  Size of specimen: 7.4 x 4.6 x 0.3 cm  Previously opened: yes, completely  External surface: wrinkled purple to tan  Wall thickness: 0.3 cm  Mucosa: granular pink to red, no polyp grossly identified  Stones present: no, not in container or specimen  Other findings: cystic duct margin inked green  Block summary: 1 - representative section with perpendicular margin  B. Labeled: right lobe liver biopsy  Tissue fragment(s): 2  Size: aggregate, 3.4 x 0.1 x 0.1 cm  Description: tan to brown cores in formalin, wrapped in lens paper and submitted in a mesh bag  Entirely submitted in 1 cassette(s).       Final Diagnosis performed by Quay Burow, MD.  Electronically signed 09/14/2017 2:49:57PM    The electronic signature indicat es that the named Attending Pathologist has evaluated the specimen  Technical component performed at [facility], [address], [place], [place] [address] Lab: [phone] Dir: Rush Farmer, MD, MMM  Professional component performed at [facility], [place], [place], [place] [address] Lab: [phone] Dir: Dellia Nims. Rubinas, MD    Glucose,  capillary     Status: Abnormal   Collection Time: 09/12/17 12:04 PM  Result Value Ref Range   Glucose-Capillary 141 (H) 65 - 99 mg/dL  Protime-INR     Status: None   Collection Time: 09/12/17 12:26 PM  Result Value Ref Range   Prothrombin Time 13.1 11.4 - 15.2 seconds   INR 1.00     Comment: Performed at [facility], [address] [place], [place] [address]  APTT     Status: None   Collection Time: 09/12/17 12:26 PM  Result Value Ref Range   aPTT 30 24 - 36 seconds    Comment: Performed at [facility], [place], [place] [address]     PHQ2/9: Depression screen [facility] 2/9 11/23/2017 11/09/2017 10/26/2017 09/28/2017 09/07/2017  Decreased Interest 1 0 0 0 0  Down, Depressed, Hopeless 1 0 0 0 0  PHQ - 2 Score 2 0 0 0 0  Altered sleeping 0 - - - -  Tired, decreased energy 1 - - - -  Change in appetite 0 - - - -  Feeling bad or failure about yourself  0 - - - -  Trouble concentrating 0 - - - -  Moving slowly or fidgety/restless 0 - - - -  Suicidal thoughts 0 - - - -  PHQ-9 Score 3 - - - -  Difficult doing work/chores Not difficult at all - - - -  Some recent data might be hidden     Fall Risk: Fall Risk  11/23/2017 11/09/2017 10/26/2017 09/28/2017 09/07/2017  Falls in the past year? No No No No No  Number falls in past yr: - - - - -  Injury with Fall? - - - - -  Follow up - - - - -    Functional Status Survey: Is the patient deaf or have difficulty hearing?: No Does the patient have difficulty seeing, even when wearing glasses/contacts?: No Does the patient have difficulty concentrating, remembering, or making decisions?: No Does the patient have difficulty walking or climbing stairs?: No Does the patient have difficulty dressing or bathing?: No Does the patient have difficulty doing errands alone such as visiting a doctor's office or shopping?: No   Assessment & Plan  1. Chronic recurrent major depressive disorder ([place])  Continue taking  effexor  2. Chronic cervical pain  Seeing Dr. Holley Raring  3. Hypertension, benign  - COMPLETE METABOLIC PANEL WITH GFR - hydrochlorothiazide (HYDRODIURIL) 25 MG tablet; Take 1 tablet (25 mg total) by mouth daily.  Dispense: 30 tablet; Refill: 5  4. Centrilobular emphysema ([place])  On oxygen, sees pulmonologist   5. Non-thrombocytopenic purpura ([place])  Doing well at this time  6. Oxygen dependent  24 hours daily   7. Dyslipidemia  - Lipid panel  8. Dysmetabolic syndrome  NTIR4E was elevated, discussed life style modification   9. Hypertriglyceridemia  Recheck labs, last triglycerides very high, trying to eat better  10. Vertigo  She went to ENT for left ear pain, that has improved, but now has episodes of vertigo while sitting, advised to discuss with ENT during her next visit.   11. Benign hypertension  - metoprolol succinate (TOPROL-XL) 50 MG 24 hr tablet; Take 1 tablet (50 mg total) by mouth daily.  Dispense: 30 tablet; Refill: 5  12. Tachycardia, paroxysmal (H[facility])  - metoprolol succinate (TOPROL-XL) 50 MG 24 hr tablet; Take 1 tablet (50 mg total) by mouth daily.  Dispense: 30 tablet; Refill: 5  13. GERD without esophagitis  - omeprazole (PRILOSEC) 40 MG capsule; Take 1 capsule (40 mg total) by mouth daily.  Dispense: 30 capsule; Refill: 5

## 2017-11-28 ENCOUNTER — Ambulatory Visit: Payer: Medicaid Other | Admitting: Urology

## 2017-12-06 ENCOUNTER — Other Ambulatory Visit: Payer: Self-pay

## 2017-12-06 ENCOUNTER — Ambulatory Visit
Payer: Medicaid Other | Attending: Student in an Organized Health Care Education/Training Program | Admitting: Student in an Organized Health Care Education/Training Program

## 2017-12-06 ENCOUNTER — Encounter: Payer: Self-pay | Admitting: Student in an Organized Health Care Education/Training Program

## 2017-12-06 VITALS — BP 147/97 | HR 94 | Temp 97.4°F | Resp 18 | Ht 64.0 in | Wt 180.0 lb

## 2017-12-06 DIAGNOSIS — M47812 Spondylosis without myelopathy or radiculopathy, cervical region: Secondary | ICD-10-CM | POA: Insufficient documentation

## 2017-12-06 DIAGNOSIS — Z85038 Personal history of other malignant neoplasm of large intestine: Secondary | ICD-10-CM | POA: Insufficient documentation

## 2017-12-06 DIAGNOSIS — G8929 Other chronic pain: Secondary | ICD-10-CM | POA: Diagnosis not present

## 2017-12-06 DIAGNOSIS — Z79899 Other long term (current) drug therapy: Secondary | ICD-10-CM | POA: Insufficient documentation

## 2017-12-06 DIAGNOSIS — J9611 Chronic respiratory failure with hypoxia: Secondary | ICD-10-CM | POA: Diagnosis not present

## 2017-12-06 DIAGNOSIS — G894 Chronic pain syndrome: Secondary | ICD-10-CM | POA: Insufficient documentation

## 2017-12-06 DIAGNOSIS — R7303 Prediabetes: Secondary | ICD-10-CM | POA: Diagnosis not present

## 2017-12-06 DIAGNOSIS — Z87891 Personal history of nicotine dependence: Secondary | ICD-10-CM | POA: Insufficient documentation

## 2017-12-06 DIAGNOSIS — Z9851 Tubal ligation status: Secondary | ICD-10-CM | POA: Insufficient documentation

## 2017-12-06 DIAGNOSIS — J449 Chronic obstructive pulmonary disease, unspecified: Secondary | ICD-10-CM | POA: Diagnosis not present

## 2017-12-06 DIAGNOSIS — Z9221 Personal history of antineoplastic chemotherapy: Secondary | ICD-10-CM | POA: Diagnosis not present

## 2017-12-06 DIAGNOSIS — F411 Generalized anxiety disorder: Secondary | ICD-10-CM | POA: Diagnosis not present

## 2017-12-06 DIAGNOSIS — Z9981 Dependence on supplemental oxygen: Secondary | ICD-10-CM | POA: Insufficient documentation

## 2017-12-06 DIAGNOSIS — I10 Essential (primary) hypertension: Secondary | ICD-10-CM | POA: Diagnosis not present

## 2017-12-06 DIAGNOSIS — M542 Cervicalgia: Secondary | ICD-10-CM | POA: Diagnosis not present

## 2017-12-06 DIAGNOSIS — F329 Major depressive disorder, single episode, unspecified: Secondary | ICD-10-CM | POA: Diagnosis not present

## 2017-12-06 DIAGNOSIS — K219 Gastro-esophageal reflux disease without esophagitis: Secondary | ICD-10-CM | POA: Insufficient documentation

## 2017-12-06 DIAGNOSIS — D649 Anemia, unspecified: Secondary | ICD-10-CM | POA: Diagnosis not present

## 2017-12-06 DIAGNOSIS — Z923 Personal history of irradiation: Secondary | ICD-10-CM | POA: Diagnosis not present

## 2017-12-06 DIAGNOSIS — G4730 Sleep apnea, unspecified: Secondary | ICD-10-CM | POA: Diagnosis not present

## 2017-12-06 LAB — COMPLETE METABOLIC PANEL WITH GFR
AG Ratio: 1.7 (calc) (ref 1.0–2.5)
ALBUMIN MSPROF: 4.5 g/dL (ref 3.6–5.1)
ALKALINE PHOSPHATASE (APISO): 63 U/L (ref 33–130)
ALT: 35 U/L — ABNORMAL HIGH (ref 6–29)
AST: 39 U/L — ABNORMAL HIGH (ref 10–35)
BUN: 10 mg/dL (ref 7–25)
CALCIUM: 9.8 mg/dL (ref 8.6–10.4)
CO2: 30 mmol/L (ref 20–32)
CREATININE: 0.9 mg/dL (ref 0.50–0.99)
Chloride: 100 mmol/L (ref 98–110)
GFR, EST NON AFRICAN AMERICAN: 69 mL/min/{1.73_m2} (ref >=60)
GFR, Est African American: 79 mL/min/{1.73_m2} (ref >=60)
GLOBULIN: 2.7 g/dL (ref 1.9–3.7)
GLUCOSE: 94 mg/dL (ref 65–99)
Potassium: 3.9 mmol/L (ref 3.5–5.3)
Sodium: 140 mmol/L (ref 135–146)
Total Bilirubin: 0.5 mg/dL (ref 0.2–1.2)
Total Protein: 7.2 g/dL (ref 6.1–8.1)

## 2017-12-06 LAB — LIPID PANEL
CHOLESTEROL: 269 mg/dL — AB (ref <200)
HDL: 35 mg/dL — ABNORMAL LOW (ref >50)
Non-HDL Cholesterol (Calc): 234 mg/dL (calc) — ABNORMAL HIGH (ref <130)
Total CHOL/HDL Ratio: 7.7 (calc) — ABNORMAL HIGH (ref <5.0)
Triglycerides: 778 mg/dL — ABNORMAL HIGH (ref <150)

## 2017-12-06 MED ORDER — TIZANIDINE HCL 4 MG PO TABS: 4.0000 mg | ORAL_TABLET | Freq: Three times a day (TID) | ORAL | 3 refills | Status: DC | PRN

## 2017-12-06 NOTE — Progress Notes (Signed)
Nursing Pain Medication Assessment:  Safety precautions to be maintained throughout the outpatient stay will include: orient to surroundings, keep bed in low position, maintain call bell within reach at all times, provide assistance with transfer out of bed and ambulation.  Medication Inspection Compliance: Pill count conducted under aseptic conditions, in front of the patient. Neither the pills nor the bottle was removed from the patient's sight at any time. Once count was completed pills were immediately returned to the patient in their original bottle.  Medication: Hydrocodone/APAP Pill/Patch Count: 38 of 60 pills remain Pill/Patch Appearance: Markings consistent with prescribed medication Bottle Appearance: Standard pharmacy container. Clearly labeled. Filled Date: 04 / 20 / 2019 Last Medication intake:  Today

## 2017-12-06 NOTE — Patient Instructions (Signed)
____________________________________________________________________________________________  General Risks and Possible Complications  Patient Responsibilities: It is important that you read this as it is part of your informed consent. It is our duty to inform you of the risks and possible complications associated with treatments offered to you. It is your responsibility as a patient to read this and to ask questions about anything that is not clear or that you believe was not covered in this document.  Patient's Rights: You have the right to refuse treatment. You also have the right to change your mind, even after initially having agreed to have the treatment done. However, under this last option, if you wait until the last second to change your mind, you may be charged for the materials used up to that point.  Introduction: Medicine is not an exact science. Everything in Medicine, including the lack of treatment(s), carries the potential for danger, harm, or loss (which is by definition: Risk). In Medicine, a complication is a secondary problem, condition, or disease that can aggravate an already existing one. All treatments carry the risk of possible complications. The fact that a side effects or complications occurs, does not imply that the treatment was conducted incorrectly. It must be clearly understood that these can happen even when everything is done following the highest safety standards.  No treatment: You can choose not to proceed with the proposed treatment alternative. The "PRO(s)" would include: avoiding the risk of complications associated with the therapy. The "CON(s)" would include: not getting any of the treatment benefits. These benefits fall under one of three categories: diagnostic; therapeutic; and/or palliative. Diagnostic benefits include: getting information which can ultimately lead to improvement of the disease or symptom(s). Therapeutic benefits are those associated with the  successful treatment of the disease. Finally, palliative benefits are those related to the decrease of the primary symptoms, without necessarily curing the condition (example: decreasing the pain from a flare-up of a chronic condition, such as incurable terminal cancer).  General Risks and Complications: These are associated to most interventional treatments. They can occur alone, or in combination. They fall under one of the following six (6) categories: no benefit or worsening of symptoms; bleeding; infection; nerve damage; allergic reactions; and/or death. 1. No benefits or worsening of symptoms: In Medicine there are no guarantees, only probabilities. No healthcare provider can ever guarantee that a medical treatment will work, they can only state the probability that it may. Furthermore, there is always the possibility that the condition may worsen, either directly, or indirectly, as a consequence of the treatment. 2. Bleeding: This is more common if the patient is taking a blood thinner, either prescription or over the counter (example: Goody Powders, Fish oil, Aspirin, Garlic, etc.), or if suffering a condition associated with impaired coagulation (example: Hemophilia, cirrhosis of the liver, low platelet counts, etc.). However, even if you do not have one on these, it can still happen. If you have any of these conditions, or take one of these drugs, make sure to notify your treating physician. 3. Infection: This is more common in patients with a compromised immune system, either due to disease (example: diabetes, cancer, human immunodeficiency virus [HIV], etc.), or due to medications or treatments (example: therapies used to treat cancer and rheumatological diseases). However, even if you do not have one on these, it can still happen. If you have any of these conditions, or take one of these drugs, make sure to notify your treating physician. 4. Nerve Damage: This is more common when the   treatment is  an invasive one, but it can also happen with the use of medications, such as those used in the treatment of cancer. The damage can occur to small secondary nerves, or to large primary ones, such as those in the spinal cord and brain. This damage may be temporary or permanent and it may lead to impairments that can range from temporary numbness to permanent paralysis and/or brain death. 5. Allergic Reactions: Any time a substance or material comes in contact with our body, there is the possibility of an allergic reaction. These can range from a mild skin rash (contact dermatitis) to a severe systemic reaction (anaphylactic reaction), which can result in death. 6. Death: In general, any medical intervention can result in death, most of the time due to an unforeseen complication. ____________________________________________________________________________________________  Radiofrequency Lesioning Radiofrequency lesioning is a procedure that is performed to relieve pain. The procedure is often used for back, neck, or arm pain. Radiofrequency lesioning involves the use of a machine that creates radio waves to make heat. During the procedure, the heat is applied to the nerve that carries the pain signal. The heat damages the nerve and interferes with the pain signal. Pain relief usually starts about 2 weeks after the procedure and lasts for 6 months to 1 year. Tell a health care provider about:  Any allergies you have.  All medicines you are taking, including vitamins, herbs, eye drops, creams, and over-the-counter medicines.  Any problems you or family members have had with anesthetic medicines.  Any blood disorders you have.  Any surgeries you have had.  Any medical conditions you have.  Whether you are pregnant or may be pregnant. What are the risks? Generally, this is a safe procedure. However, problems may occur, including:  Pain or soreness at the injection site.  Infection at the  injection site.  Damage to nerves or blood vessels.  What happens before the procedure?  Ask your health care provider about: ? Changing or stopping your regular medicines. This is especially important if you are taking diabetes medicines or blood thinners. ? Taking medicines such as aspirin and ibuprofen. These medicines can thin your blood. Do not take these medicines before your procedure if your health care provider instructs you not to.  Follow instructions from your health care provider about eating or drinking restrictions.  Plan to have someone take you home after the procedure.  If you go home right after the procedure, plan to have someone with you for 24 hours. What happens during the procedure?  You will be given one or more of the following: ? A medicine to help you relax (sedative). ? A medicine to numb the area (local anesthetic).  You will be awake during the procedure. You will need to be able to talk with the health care provider during the procedure.  With the help of a type of X-ray (fluoroscopy), the health care provider will insert a radiofrequency needle into the area to be treated.  Next, a wire that carries the radio waves (electrode) will be put through the radiofrequency needle. An electrical pulse will be sent through the electrode to verify the correct nerve. You will feel a tingling sensation, and you may have muscle twitching.  Then, the tissue that is around the needle tip will be heated by an electric current that is passed using the radiofrequency machine. This will numb the nerves.  A bandage (dressing) will be put on the insertion area after the procedure is done.   The procedure may vary among health care providers and hospitals. What happens after the procedure?  Your blood pressure, heart rate, breathing rate, and blood oxygen level will be monitored often until the medicines you were given have worn off.  Return to your normal activities as  directed by your health care provider. This information is not intended to replace advice given to you by your health care provider. Make sure you discuss any questions you have with your health care provider. Document Released: 03/24/2011 Document Revised: 01/01/2016 Document Reviewed: 09/02/2014 [employer] Interactive Patient Education  2018 [employer]  

## 2017-12-06 NOTE — Progress Notes (Signed)
Patient's Name: Judy Allen  MRN: [id number]  Referring Provider: Steele Sizer, MD  DOB: 04-07-1956  PCP: Judy Sizer, MD  DOS: 12/06/2017  Note by: Gillis Santa, MD  Service setting: Ambulatory outpatient  Specialty: Interventional Pain Management  Location: [facility] (AMB) Pain Management Facility    Patient type: Established   Primary Reason(s) for Visit: Encounter for post-procedure evaluation of chronic illness with mild to moderate exacerbation CC: Neck Pain (down left arm)  HPI  Judy Allen is a 62 y.o. year old, female patient, who comes today for a post-procedure evaluation. She has Tachycardia, paroxysmal (Judy Allen); Benign hypertension; Chronic cervical pain; CN (constipation); Gastric reflux; Hypertriglyceridemia; Edema leg; Chronic recurrent major depressive disorder (Judy Allen); Dysmetabolic syndrome; Obstructive apnea; Psoriasis; Allergic rhinitis; Bursitis, trochanteric; GAD (generalized anxiety disorder); History of fusion of cervical spine; Chronic constipation; Non-thrombocytopenic purpura (Judy Allen); Metastatic squamous cell carcinoma (Judy Allen); Anal squamous cell carcinoma (Judy Allen); Abnormal Pap smear of cervix; Centrilobular emphysema (Judy Allen); Chronic respiratory failure with hypoxia, on home oxygen therapy (Judy Allen); Fatty liver; 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 Her primarily concern today is the Neck Pain (down left arm)  Pain Assessment: Location:   Neck Radiating: left arm Onset: More than a month ago Duration: Chronic pain Quality: Sharp, Burning, Jabbing Severity: 4 /10 (self-reported pain score)  Note: Reported level is compatible with observation.                         When using our objective Pain Scale, levels between 6 and 10/10 are said to belong in an emergency room, as it progressively worsens from a 6/10, described as severely limiting, requiring emergency care not usually available at an outpatient pain  management facility. At a 6/10 level, communication becomes difficult and requires great effort. Assistance to reach the emergency department may be required. Facial flushing and profuse sweating along with potentially dangerous increases in heart rate and blood pressure will be evident. Effect on ADL:   Timing: Constant Modifying factors: procedures, medications  Judy Allen comes in today for post-procedure evaluation after the treatment done on 11/09/2017.  Further details on both, my assessment(s), as well as the proposed treatment plan, please see below.  Post-Procedure Assessment  11/09/2017 Procedure: Bilateral C4, 5, 6, 7 facet medial branch nerve block Pre-procedure pain score:  8/10 Post-procedure pain score: 0/10         Influential Factors: BMI: 30.90 kg/m Intra-procedural challenges: None observed.         Assessment challenges: None detected.              Reported side-effects: None.        Post-procedural adverse reactions or complications: None reported         Sedation: Please see nurses note. When no sedatives are used, the analgesic levels obtained are directly associated to the effectiveness of the local anesthetics. However, when sedation is provided, the level of analgesia obtained during the initial 1 hour following the intervention, is believed to be the result of a combination of factors. These factors may include, but are not limited to: 1. The effectiveness of the local anesthetics used. 2. The effects of the analgesic(s) and/or anxiolytic(s) used. 3. The degree of discomfort experienced by the patient at the time of the procedure. 4. The patients ability and reliability in recalling and recording the events. 5. The presence and influence of possible secondary gains and/or psychosocial factors. Reported result: Relief  experienced during the 1st hour after the procedure: 100 % (Ultra-Short Term Relief)            Interpretative annotation: Clinically appropriate  result. Analgesia during this period is likely to be Local Anesthetic and/or IV Sedative (Analgesic/Anxiolytic) related.          Effects of local anesthetic: The analgesic effects attained during this period are directly associated to the localized infiltration of local anesthetics and therefore cary significant diagnostic value as to the etiological location, or anatomical origin, of the pain. Expected duration of relief is directly dependent on the pharmacodynamics of the local anesthetic used. Long-acting (4-6 hours) anesthetics used.  Reported result: Relief during the next 4 to 6 hour after the procedure: 100 % (Short-Term Relief)            Interpretative annotation: Clinically appropriate result. Analgesia during this period is likely to be Local Anesthetic-related.          Long-term benefit: Defined as the period of time past the expected duration of local anesthetics (1 hour for short-acting and 4-6 hours for long-acting). With the possible exception of prolonged sympathetic blockade from the local anesthetics, benefits during this period are typically attributed to, or associated with, other factors such as analgesic sensory neuropraxia, antiinflammatory effects, or beneficial biochemical changes provided by agents other than the local anesthetics.  Reported result: Extended relief following procedure: 100 %(for 2 days then has gradually returned to pre procedure score of 5) (Long-Term Relief)            Interpretative annotation: Clinically appropriate result. Good relief. No permanent benefit expected. Inflammation plays a part in the etiology to the pain.          Current benefits: Defined as reported results that persistent at this point in time.   Analgesia: 0-25 %            Function: Somewhat improved ROM: Somewhat improved Interpretative annotation: Recurrence of symptoms. No permanent benefit expected. Effective diagnostic intervention.          Interpretation: Results would  suggest a successful diagnostic intervention.                  Plan:  Proceed with Radiofrequency Ablation for the purpose of attaining long-term benefits.                Laboratory Chemistry  Inflammation Markers (CRP: Acute Phase) (ESR: Chronic Phase) No results found for: CRP, ESRSEDRATE, LATICACIDVEN                       Rheumatology Markers No results found for: RF, ANA, Therisa [place], [facility]                      Renal Function Markers Lab Results  Component Value Date   BUN 12 08/03/2017   CREATININE 0.87 08/03/2017   GFRAA 83 08/03/2017   GFRNONAA 71 08/03/2017                              Hepatic Function Markers Lab Results  Component Value Date   AST 83 (H) 08/29/2017   ALT 103 (H) 08/29/2017   ALBUMIN 4.3 03/03/2017   ALKPHOS 53 03/03/2017   LIPASE 27 08/03/2017                        Electrolytes Lab Results  Component Value Date   NA 140 08/03/2017   K 3.9 08/03/2017   CL 98 08/03/2017   CALCIUM 10.3 08/03/2017                        Neuropathy Markers Lab Results  Component Value Date   HGBA1C 6.1 (H) 08/29/2017                        Bone Pathology Markers No results found for: VD25OH, [id number], [id number], [id number], 25OHVITD1, 25OHVITD2, 25OHVITD3, TESTOFREE, TESTOSTERONE                       Coagulation Parameters Lab Results  Component Value Date   INR 1.00 09/12/2017   LABPROT 13.1 09/12/2017   APTT 30 09/12/2017   PLT 262 08/03/2017                        Cardiovascular Markers Lab Results  Component Value Date   CKTOTAL 128 06/08/2012   CKMB 2.2 06/08/2012   TROPONINI < 0.02 06/08/2012   HGB 12.8 08/03/2017   HCT 37.0 08/03/2017                         CA Markers No results found for: CEA, CA125, LABCA2                      Note: Lab results reviewed.  Recent Diagnostic Imaging Results  DG C-Arm 1-60 Min-No Report Fluoroscopy was utilized by the requesting physician.  No radiographic   interpretation.   Complexity Note: Imaging results reviewed. Results shared with Ms. Bagnall, using Layman's terms.                         Meds   Current Outpatient Medications:  .  albuterol (PROVENTIL HFA;VENTOLIN HFA) 108 (90 Base) MCG/ACT inhaler, Inhale 2 puffs into the lungs every 6 (six) hours as needed for wheezing or shortness of breath., Disp: , Rfl:  .  betamethasone dipropionate (DIPROLENE) 0.05 % cream, Apply 1 application topically 2 (two) times daily as needed., Disp: , Rfl:  .  budesonide-formoterol (SYMBICORT) 160-4.5 MCG/ACT inhaler, Inhale 2 puffs into the lungs 2 (two) times daily., Disp: , Rfl:  .  divalproex (DEPAKOTE) 250 MG DR tablet, Take 1 tablet (250 mg total) by mouth at bedtime., Disp: 30 tablet, Rfl: 5 .  fluticasone (FLONASE) 50 MCG/ACT nasal spray, Place 2 sprays into the nose daily., Disp: , Rfl:  .  gabapentin (NEURONTIN) 300 MG capsule, Take 1 capsule (300 mg total) by mouth 3 (three) times daily., Disp: 90 capsule, Rfl: 3 .  hydrochlorothiazide (HYDRODIURIL) 25 MG tablet, Take 1 tablet (25 mg total) by mouth daily., Disp: 30 tablet, Rfl: 5 .  HYDROcodone-acetaminophen (NORCO) 7.5-325 MG tablet, Take 1 tablet by mouth 2 (two) times daily as needed for moderate pain. For chronic pain To fill on or after: 10/26/2017, 11/25/2017 To last for 30 days from fill date, Disp: 60 tablet, Rfl: 0 .  hydrocortisone 2.5 % cream, Apply 1 application topically 2 (two) times daily., Disp: , Rfl:  .  ketoconazole (NIZORAL) 2 % cream, Apply 1 application topically 2 (two) times daily., Disp: , Rfl:  .  loratadine (CLARITIN) 10 MG tablet, Take 1 tablet (10 mg total) by mouth daily., Disp: 30 tablet, Rfl: 5 .  metoprolol succinate (TOPROL-XL) 50 MG 24 hr tablet, Take 1 tablet (50 mg total) by mouth daily., Disp: 30 tablet, Rfl: 5 .  mirabegron ER (MYRBETRIQ) 50 MG TB24 tablet, Take 1 tablet (50 mg total) by mouth daily., Disp: 30 tablet, Rfl: 11 .  omega-3 acid ethyl esters (LOVAZA)  1 g capsule, Take 2 capsules (2 g total) 2 (two) times daily by mouth., Disp: 120 capsule, Rfl: 5 .  omeprazole (PRILOSEC) 40 MG capsule, Take 1 capsule (40 mg total) by mouth daily., Disp: 30 capsule, Rfl: 5 .  OXYGEN, Inhale 2 L into the lungs continuous. , Disp: , Rfl:  .  tiotropium (SPIRIVA) 18 MCG inhalation capsule, Place 18 mcg into inhaler and inhale daily. , Disp: , Rfl:  .  tiZANidine (ZANAFLEX) 4 MG tablet, Take 1 tablet (4 mg total) by mouth every 8 (eight) hours as needed., Disp: 90 tablet, Rfl: 3 .  venlafaxine XR (EFFEXOR-XR) 150 MG 24 hr capsule, Take 1 capsule (150 mg total) by mouth daily with breakfast., Disp: 30 capsule, Rfl: 5  ROS  Constitutional: Denies any fever or chills Gastrointestinal: No reported hemesis, hematochezia, vomiting, or acute GI distress Musculoskeletal: Denies any acute onset joint swelling, redness, loss of ROM, or weakness Neurological: No reported episodes of acute onset apraxia, aphasia, dysarthria, agnosia, amnesia, paralysis, loss of coordination, or loss of consciousness  Allergies  Ms. Clary is allergic to dapsone; sulfa antibiotics; and sulfasalazine.  PFSH  Drug: Ms. Muriel  reports that she does not use drugs. Alcohol:  reports that she does not drink alcohol. Tobacco:  reports that she quit smoking about 11 years ago. Her smoking use included cigarettes. She started smoking about 54 years ago. She has a 32.00 pack-year smoking history. She has never used smokeless tobacco. Medical:  has a past medical history of Anemia, Anxiety, Arthritis, Colon cancer ([place]) (2919), Complication of anesthesia, Constipation, COPD (chronic obstructive pulmonary disease) ([place]), Depression, Dysrhythmia, GERD (gastroesophageal reflux disease), H/O allergic rhinitis, Heart murmur, Hemorrhoid, Hyperlipidemia, Hypertension, Neck pain, chronic, Neuritis, Ovarian failure, Personal history of chemotherapy, Personal history of radiation therapy, PONV (postoperative nausea  and vomiting), Pre-diabetes (2019), Proteinuria, Psoriasis, Sleep apnea (09/12/2016), Tachycardia, paroxysmal ([place]), and Urinary incontinence. Surgical: Ms. Griego  has a past surgical history that includes Tubal ligation; tonsillectomy (1959); herniated disc repair (2001); Anterior fusion lumbar spine (1990); Neck surgery; Mass excision (Left, 02/23/2016); Colonoscopy (2018); Portacath placement (Left, 03/12/2016); Cardiac catheterization (2009? ); Cardiac catheterization (05/2012); Port-a-cath removal (2018); Liver biopsy (N/A, 09/12/2017); and Cholecystectomy (N/A, 09/12/2017). Family: family history includes Asthma in her mother; Breast cancer in her maternal aunt; Heart attack in her mother; Liver cancer in her father.  Constitutional Exam  General appearance: Well nourished, well developed, and well hydrated. In no apparent acute distress Vitals:   12/06/17 1214  BP: (!) 147/97  Pulse: 94  Resp: 18  Temp: (!) 97.4 F (36.3 C)  TempSrc: Oral  SpO2: 94%  Weight: 180 lb (81.6 kg)  Height: '5\' 4"'  (1.626 m)   BMI Assessment: Estimated body mass index is 30.9 kg/m as calculated from the following:   Height as of this encounter: '5\' 4"'  (1.626 m).   Weight as of this encounter: 180 lb (81.6 kg).  BMI interpretation table: BMI level Category Range association with higher incidence of chronic pain  <18 kg/m2 Underweight   18.5-24.9 kg/m2 Ideal body weight   25-29.9 kg/m2 Overweight Increased incidence by 20%  30-34.9 kg/m2 Obese (Class I) Increased incidence by 68%  35-39.9  kg/m2 Severe obesity (Class II) Increased incidence by 136%  >40 kg/m2 Extreme obesity (Class III) Increased incidence by 254%   Patient's current BMI Ideal Body weight  Body mass index is 30.9 kg/m. Ideal body weight: 54.7 kg (120 lb 9.5 oz) Adjusted ideal body weight: 65.5 kg (144 lb 5.7 oz)   BMI Readings from Last 4 Encounters:  12/06/17 30.90 kg/m  11/23/17 31.21 kg/m  11/09/17 30.90 kg/m  10/26/17 31.24 kg/m    Wt Readings from Last 4 Encounters:  12/06/17 180 lb (81.6 kg)  11/23/17 181 lb 12.8 oz (82.5 kg)  11/09/17 180 lb (81.6 kg)  10/26/17 182 lb (82.6 kg)  Psych/Mental status: Alert, oriented x 3 (person, place, & time)       Eyes: PERLA Respiratory: No evidence of acute respiratory distress  Cervical Spine Area Exam  Skin & Axial Inspection: No masses, redness, edema, swelling, or associated skin lesions Alignment: Symmetrical Functional ROM: Decreased ROM, bilaterally L>R Stability: No instability detected Muscle Tone/Strength: Functionally intact. No obvious neuro-muscular anomalies detected. Sensory (Neurological): Articular pain pattern Palpation: Complains of area being tender to palpation Positive provocative maneuver for for cervical facet disease  Upper Extremity (UE) Exam    Side: Right upper extremity  Side: Left upper extremity  Skin & Extremity Inspection: Skin color, temperature, and hair growth are WNL. No peripheral edema or cyanosis. No masses, redness, swelling, asymmetry, or associated skin lesions. No contractures.  Skin & Extremity Inspection: Skin color, temperature, and hair growth are WNL. No peripheral edema or cyanosis. No masses, redness, swelling, asymmetry, or associated skin lesions. No contractures.  Functional ROM: Unrestricted ROM          Functional ROM: Unrestricted ROM          Muscle Tone/Strength: Functionally intact. No obvious neuro-muscular anomalies detected.  Muscle Tone/Strength: Functionally intact. No obvious neuro-muscular anomalies detected.  Sensory (Neurological): Unimpaired          Sensory (Neurological): Unimpaired          Palpation: No palpable anomalies              Palpation: No palpable anomalies              Specialized Test(s): Deferred         Specialized Test(s): Deferred          Thoracic Spine Area Exam  Skin & Axial Inspection: No masses, redness, or swelling Alignment: Symmetrical Functional ROM: Unrestricted  ROM Stability: No instability detected Muscle Tone/Strength: Functionally intact. No obvious neuro-muscular anomalies detected. Sensory (Neurological): Unimpaired Muscle strength & Tone: No palpable anomalies  Lumbar Spine Area Exam  Skin & Axial Inspection: No masses, redness, or swelling Alignment: Symmetrical Functional ROM: Unrestricted ROM       Stability: No instability detected Muscle Tone/Strength: Functionally intact. No obvious neuro-muscular anomalies detected. Sensory (Neurological): Unimpaired Palpation: No palpable anomalies       Provocative Tests: Lumbar Hyperextension and rotation test: evaluation deferred today       Lumbar Lateral bending test: evaluation deferred today       Patrick's Maneuver: evaluation deferred today                    Gait & Posture Assessment  Ambulation: Unassisted Gait: Relatively normal for age and body habitus Posture: WNL   Lower Extremity Exam    Side: Right lower extremity  Side: Left lower extremity  Skin & Extremity Inspection: Skin color, temperature, and hair growth are WNL.  No peripheral edema or cyanosis. No masses, redness, swelling, asymmetry, or associated skin lesions. No contractures.  Skin & Extremity Inspection: Skin color, temperature, and hair growth are WNL. No peripheral edema or cyanosis. No masses, redness, swelling, asymmetry, or associated skin lesions. No contractures.  Functional ROM: Unrestricted ROM          Functional ROM: Unrestricted ROM          Muscle Tone/Strength: Functionally intact. No obvious neuro-muscular anomalies detected.  Muscle Tone/Strength: Functionally intact. No obvious neuro-muscular anomalies detected.  Sensory (Neurological): Unimpaired  Sensory (Neurological): Unimpaired  Palpation: No palpable anomalies  Palpation: No palpable anomalies   Assessment  Primary Diagnosis & Pertinent Problem List: The primary encounter diagnosis was Spondylosis of cervical region without myelopathy or  radiculopathy. Diagnoses of Chronic cervical pain, Cervical facet joint syndrome, and Chronic pain syndrome were also pertinent to this visit.  Status Diagnosis  Responding Persistent Responding 1. Spondylosis of cervical region without myelopathy or radiculopathy   2. Chronic cervical pain   3. Cervical facet joint syndrome   4. Chronic pain syndrome     General Recommendations: The pain condition that the patient suffers from is best treated with a multidisciplinary approach that involves an increase in physical activity to prevent de-conditioning and worsening of the pain cycle, as well as psychological counseling (formal and/or informal) to address the co-morbid psychological affects of pain. Treatment will often involve judicious use of pain medications and interventional procedures to decrease the pain, allowing the patient to participate in the physical activity that will ultimately produce long-lasting pain reductions. The goal of the multidisciplinary approach is to return the patient to a higher level of overall function and to restore their ability to perform activities of daily living.  62 year old female with a history of axial neck pain that radiates into bilateral shoulders secondary to cervical spondylosis and cervical facet arthropathy most pronounced at C4, C5, C6, C7 status post cervical facet medial branch nerve blocks at C4, 5, 6, 7 #2 who presents for follow-up.  Patient endorses significant pain relief of her axial neck symptoms as well as shoulder pain for approximately 3-4 days after her injection.  She states that she was close to 100% pain relief for the first couple of days but is as noted gradual return of her neck pain after the first 4 days.  Patient states that she saw a longer duration of pain relief after her first set of cervical facet medial branch nerve blocks.  Given that this was 2 series of positive diagnostic blocks that resulted in significant greater than 75%  neck pain relief for greater than 3 days, we discussed proceeding with radiofrequency ablation of her cervical facet medial branch nerves at C4, C5, C6, C7 starting with the left side first then followed by the right.  Risks and benefits of the procedure were discussed.  Patient would like to proceed.  Plan: -Schedule for left C4, C5, C6, C7 radiofrequency ablation followed by right -Continue hydrocodone as prescribed.  Continue medication refill at procedure visit. -Continue gabapentin, tizanidine, Effexor as prescribed.   Plan of Care  Pharmacotherapy (Medications Ordered): Meds ordered this encounter  Medications  . tiZANidine (ZANAFLEX) 4 MG tablet    Sig: Take 1 tablet (4 mg total) by mouth every 8 (eight) hours as needed.    Dispense:  90 tablet    Refill:  3   Lab-work, procedure(s), and/or referral(s): Orders Placed This Encounter  Procedures  . Radiofrequency,Cervical   Time  Note: Greater than 50% of the 25 minute(s) of face-to-face time spent with Ms. Ivanov, was spent in counseling/coordination of care regarding: the treatment plan, treatment alternatives, the risks and possible complications of proposed treatment, going over the informed consent, the results, interpretation and significance of  her recent diagnostic interventional treatment(s), the appropriate use of her medications, realistic expectations and the medication agreement. Provider-requested follow-up: Return in about 2 weeks (around 12/20/2017) for Procedure.  Future Appointments  Date Time Provider [place]  01/04/2018  8:00 AM Gillis Santa, MD [facility] None  01/09/2018  2:45 PM [facility] None  02/22/2018  2:20 PM Judy Sizer, MD [facility]  05/11/2018  3:00 PM Rubie Maid, MD [facility] None    Primary Care Physician: Judy Sizer, MD Location: [facility] Outpatient [facility] Management Facility Note by: Gillis Allen, M.D Date: 12/06/2017; Time: 2:52 PM  Patient Instructions   ____________________________________________________________________________________________  General Risks and Possible Complications  Patient Responsibilities: It is important that you read this as it is part of your informed consent. It is our duty to inform you of the risks and possible complications associated with treatments offered to you. It is your responsibility as a patient to read this and to ask questions about anything that is not clear or that you believe was not covered in this document.  Patient's Rights: You have the right to refuse treatment. You also have the right to change your mind, even after initially having agreed to have the treatment done. However, under this last option, if you wait until the last second to change your mind, you may be charged for the materials used up to that point.  Introduction: Medicine is not an Chief Strategy Officer. Everything in Medicine, including the lack of treatment(s), carries the potential for danger, harm, or loss (which is by definition: Risk). In Medicine, a complication is a secondary problem, condition, or disease that can aggravate an already existing one. All treatments carry the risk of possible complications. The fact that a side effects or complications occurs, does not imply that the treatment was conducted incorrectly. It must be clearly understood that these can happen even when everything is done following the highest safety standards.  No treatment: You can choose not to proceed with the proposed treatment alternative. The "PRO(s)" would include: avoiding the risk of complications associated with the therapy. The "CON(s)" would include: not getting any of the treatment benefits. These benefits fall under one of three categories: diagnostic; therapeutic; and/or palliative. Diagnostic benefits include: getting information which can ultimately lead to improvement of the disease or symptom(s). Therapeutic benefits are those associated with  the successful treatment of the disease. Finally, palliative benefits are those related to the decrease of the primary symptoms, without necessarily curing the condition (example: decreasing the pain from a flare-up of a chronic condition, such as incurable terminal cancer).  General Risks and Complications: These are associated to most interventional treatments. They can occur alone, or in combination. They fall under one of the following six (6) categories: no benefit or worsening of symptoms; bleeding; infection; nerve damage; allergic reactions; and/or death. 1. No benefits or worsening of symptoms: In Medicine there are no guarantees, only probabilities. No healthcare provider can ever guarantee that a medical treatment will work, they can only state the probability that it may. Furthermore, there is always the possibility that the condition may worsen, either directly, or indirectly, as a consequence of the treatment. 2. Bleeding: This is more common if the patient is  taking a blood thinner, either prescription or over the counter (example: Goody Powders, Fish oil, Aspirin, Garlic, etc.), or if suffering a condition associated with impaired coagulation (example: Hemophilia, cirrhosis of the liver, low platelet counts, etc.). However, even if you do not have one on these, it can still happen. If you have any of these conditions, or take one of these drugs, make sure to notify your treating physician. 3. Infection: This is more common in patients with a compromised immune system, either due to disease (example: diabetes, cancer, human immunodeficiency virus [HIV], etc.), or due to medications or treatments (example: therapies used to treat cancer and rheumatological diseases). However, even if you do not have one on these, it can still happen. If you have any of these conditions, or take one of these drugs, make sure to notify your treating physician. 4. Nerve Damage: This is more common when the treatment  is an invasive one, but it can also happen with the use of medications, such as those used in the treatment of cancer. The damage can occur to small secondary nerves, or to large primary ones, such as those in the spinal cord and brain. This damage may be temporary or permanent and it may lead to impairments that can range from temporary numbness to permanent paralysis and/or brain death. 5. Allergic Reactions: Any time a substance or material comes in contact with our body, there is the possibility of an allergic reaction. These can range from a mild skin rash (contact dermatitis) to a severe systemic reaction (anaphylactic reaction), which can result in death. 6. Death: In general, any medical intervention can result in death, most of the time due to an unforeseen complication. ____________________________________________________________________________________________  Radiofrequency Lesioning Radiofrequency lesioning is a procedure that is performed to relieve pain. The procedure is often used for back, neck, or arm pain. Radiofrequency lesioning involves the use of a machine that creates radio waves to make heat. During the procedure, the heat is applied to the nerve that carries the pain signal. The heat damages the nerve and interferes with the pain signal. Pain relief usually starts about 2 weeks after the procedure and lasts for 6 months to 1 year. Tell a health care provider about:  Any allergies you have.  All medicines you are taking, including vitamins, herbs, eye drops, creams, and over-the-counter medicines.  Any problems you or family members have had with anesthetic medicines.  Any blood disorders you have.  Any surgeries you have had.  Any medical conditions you have.  Whether you are pregnant or may be pregnant. What are the risks? Generally, this is a safe procedure. However, problems may occur, including:  Pain or soreness at the injection site.  Infection at the  injection site.  Damage to nerves or blood vessels.  What happens before the procedure?  Ask your health care provider about: ? Changing or stopping your regular medicines. This is especially important if you are taking diabetes medicines or blood thinners. ? Taking medicines such as aspirin and ibuprofen. These medicines can thin your blood. Do not take these medicines before your procedure if your health care provider instructs you not to.  Follow instructions from your health care provider about eating or drinking restrictions.  Plan to have someone take you home after the procedure.  If you go home right after the procedure, plan to have someone with you for 24 hours. What happens during the procedure?  You will be given one or more of the following: ?  A medicine to help you relax (sedative). ? A medicine to numb the area (local anesthetic).  You will be awake during the procedure. You will need to be able to talk with the health care provider during the procedure.  With the help of a type of X-ray (fluoroscopy), the health care provider will insert a radiofrequency needle into the area to be treated.  Next, a wire that carries the radio waves (electrode) will be put through the radiofrequency needle. An electrical pulse will be sent through the electrode to verify the correct nerve. You will feel a tingling sensation, and you may have muscle twitching.  Then, the tissue that is around the needle tip will be heated by an electric current that is passed using the radiofrequency machine. This will numb the nerves.  A bandage (dressing) will be put on the insertion area after the procedure is done. The procedure may vary among health care providers and hospitals. What happens after the procedure?  Your blood pressure, heart rate, breathing rate, and blood oxygen level will be monitored often until the medicines you were given have worn off.  Return to your normal activities as  directed by your health care provider. This information is not intended to replace advice given to you by your health care provider. Make sure you discuss any questions you have with your health care provider. Document Released: 03/24/2011 Document Revised: 01/01/2016 Document Reviewed: 09/02/2014 [employer] Interactive Patient Education  [employer].

## 2017-12-08 ENCOUNTER — Encounter: Payer: Self-pay | Admitting: Family Medicine

## 2017-12-08 ENCOUNTER — Other Ambulatory Visit: Payer: Self-pay | Admitting: Family Medicine

## 2017-12-08 DIAGNOSIS — E785 Hyperlipidemia, unspecified: Secondary | ICD-10-CM

## 2017-12-15 ENCOUNTER — Other Ambulatory Visit: Payer: Self-pay

## 2017-12-15 ENCOUNTER — Ambulatory Visit
Payer: Medicaid Other | Attending: Student in an Organized Health Care Education/Training Program | Admitting: Student in an Organized Health Care Education/Training Program

## 2017-12-15 ENCOUNTER — Encounter: Payer: Self-pay | Admitting: Student in an Organized Health Care Education/Training Program

## 2017-12-15 VITALS — BP 158/102 | HR 89 | Temp 98.4°F | Resp 18 | Ht 64.0 in | Wt 180.0 lb

## 2017-12-15 DIAGNOSIS — Z79899 Other long term (current) drug therapy: Secondary | ICD-10-CM | POA: Insufficient documentation

## 2017-12-15 DIAGNOSIS — I10 Essential (primary) hypertension: Secondary | ICD-10-CM | POA: Diagnosis not present

## 2017-12-15 DIAGNOSIS — M542 Cervicalgia: Secondary | ICD-10-CM | POA: Diagnosis not present

## 2017-12-15 DIAGNOSIS — R7303 Prediabetes: Secondary | ICD-10-CM | POA: Diagnosis not present

## 2017-12-15 DIAGNOSIS — D649 Anemia, unspecified: Secondary | ICD-10-CM | POA: Insufficient documentation

## 2017-12-15 DIAGNOSIS — G894 Chronic pain syndrome: Secondary | ICD-10-CM

## 2017-12-15 DIAGNOSIS — Z85038 Personal history of other malignant neoplasm of large intestine: Secondary | ICD-10-CM | POA: Diagnosis not present

## 2017-12-15 DIAGNOSIS — G8929 Other chronic pain: Secondary | ICD-10-CM

## 2017-12-15 DIAGNOSIS — M4722 Other spondylosis with radiculopathy, cervical region: Secondary | ICD-10-CM | POA: Diagnosis not present

## 2017-12-15 DIAGNOSIS — K219 Gastro-esophageal reflux disease without esophagitis: Secondary | ICD-10-CM | POA: Diagnosis not present

## 2017-12-15 DIAGNOSIS — J449 Chronic obstructive pulmonary disease, unspecified: Secondary | ICD-10-CM | POA: Diagnosis not present

## 2017-12-15 DIAGNOSIS — F411 Generalized anxiety disorder: Secondary | ICD-10-CM | POA: Insufficient documentation

## 2017-12-15 DIAGNOSIS — Z9221 Personal history of antineoplastic chemotherapy: Secondary | ICD-10-CM | POA: Insufficient documentation

## 2017-12-15 DIAGNOSIS — Z5181 Encounter for therapeutic drug level monitoring: Secondary | ICD-10-CM | POA: Insufficient documentation

## 2017-12-15 DIAGNOSIS — J9611 Chronic respiratory failure with hypoxia: Secondary | ICD-10-CM | POA: Diagnosis not present

## 2017-12-15 DIAGNOSIS — F329 Major depressive disorder, single episode, unspecified: Secondary | ICD-10-CM | POA: Insufficient documentation

## 2017-12-15 DIAGNOSIS — M47812 Spondylosis without myelopathy or radiculopathy, cervical region: Secondary | ICD-10-CM

## 2017-12-15 DIAGNOSIS — Z87891 Personal history of nicotine dependence: Secondary | ICD-10-CM | POA: Insufficient documentation

## 2017-12-15 DIAGNOSIS — Z923 Personal history of irradiation: Secondary | ICD-10-CM | POA: Diagnosis not present

## 2017-12-15 MED ORDER — HYDROCODONE-ACETAMINOPHEN 7.5-325 MG PO TABS: 1.0000 | ORAL_TABLET | Freq: Two times a day (BID) | ORAL | 0 refills | Status: DC | PRN

## 2017-12-15 MED ORDER — HYDROCODONE-ACETAMINOPHEN 7.5-325 MG PO TABS: 1.0000 | ORAL_TABLET | Freq: Three times a day (TID) | ORAL | 0 refills | Status: DC | PRN

## 2017-12-15 NOTE — Progress Notes (Signed)
Patient's Name: Judy Allen  MRN: [id number]  Referring Provider: Steele Sizer, MD  DOB: Dec 22, 1955  PCP: Steele Sizer, MD  DOS: 12/15/2017  Note by: Gillis Santa, MD  Service setting: Ambulatory outpatient  Specialty: Interventional Pain Management  Location: [facility] (AMB) Pain Management Facility    Patient type: Established   Primary Reason(s) for Visit: Encounter for prescription drug management. (Level of risk: moderate)  CC: Neck Pain  HPI  Judy Allen is a 62 y.o. year old, female patient, who comes today for a medication management evaluation. She has Tachycardia, paroxysmal ([place]); Benign hypertension; Chronic cervical pain; CN (constipation); Gastric reflux; Hypertriglyceridemia; Edema leg; Chronic recurrent major depressive disorder ([place]); Dysmetabolic syndrome; Obstructive apnea; Psoriasis; Allergic rhinitis; Bursitis, trochanteric; GAD (generalized anxiety disorder); History of fusion of cervical spine; Chronic constipation; Non-thrombocytopenic purpura ([place]); Metastatic squamous cell carcinoma ([place]); Anal squamous cell carcinoma ([place]); Abnormal Pap smear of cervix; Centrilobular emphysema ([place]); Chronic respiratory failure with hypoxia, on home oxygen therapy ([place]); Fatty liver; 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 Her primarily concern today is the Neck Pain  Pain Assessment: Location: Left Neck Radiating: to left arm down to fingertips Onset: More than a month ago Duration: Chronic pain Quality: Sharp, Burning, Constant, Jabbing Severity: 4 /10 (subjective, self-reported pain score)  Note: Reported level is compatible with observation.                         When using our objective Pain Scale, levels between 6 and 10/10 are said to belong in an emergency room, as it progressively worsens from a 6/10, described as severely limiting, requiring emergency care not usually available at an outpatient pain  management facility. At a 6/10 level, communication becomes difficult and requires great effort. Assistance to reach the emergency department may be required. Facial flushing and profuse sweating along with potentially dangerous increases in heart rate and blood pressure will be evident. Effect on ADL: ADLs take longer due to pain and need for frequent rest periods Timing: Constant Modifying factors: preocedures, medications BP: (!) 158/102  HR: 89  Judy Allen was last scheduled for an appointment on 12/06/2017 for medication management. During today's appointment we reviewed Judy Allen's chronic pain status, as well as her outpatient medication regimen.  Patient has completed her second round of C4, C5, C6, C7 facet medial branch nerve block and is scheduled for radiofrequency ablation of these nerves.  At her last procedure visit given worsening neck pain, we discussed increasing her hydrocodone from 7.5 mg twice daily to 3 times daily as needed.  We will refill patient's hydrocodone at 3 times daily as needed, quantity 90.  The patient  reports that she does not use drugs. Her body mass index is 30.9 kg/m.  Further details on both, my assessment(s), as well as the proposed treatment plan, please see below.  Controlled Substance Pharmacotherapy Assessment REMS (Risk Evaluation and Mitigation Strategy)  Analgesic: Hydrocodone 7.5 mg TID MME/day: 22.5 mg/day.  Rise Patience, RN  12/15/2017 12:15 PM  Sign at close encounter Nursing Pain Medication Assessment:  Safety precautions to be maintained throughout the outpatient stay will include: orient to surroundings, keep bed in low position, maintain call bell within reach at all times, provide assistance with transfer out of bed and ambulation.  Medication Inspection Compliance: Pill count conducted under aseptic conditions, in front of the patient. Neither the pills nor the bottle was removed from the  patient's sight at any time. Once count was  completed pills were immediately returned to the patient in their original bottle.  Medication: Hydrocodone/APAP Pill/Patch Count: 11 of 60 pills remain Pill/Patch Appearance: Markings consistent with prescribed medication Bottle Appearance: Standard pharmacy container. Clearly labeled. Filled Date: 4 / 20 / 2019 Last Medication intake:  Today   Pharmacokinetics: Liberation and absorption (onset of action): WNL Distribution (time to peak effect): WNL Metabolism and excretion (duration of action): WNL         Pharmacodynamics: Desired effects: Analgesia: Judy Allen reports >50% benefit. Functional ability: Patient reports that medication allows her to accomplish basic ADLs Clinically meaningful improvement in function (CMIF): Sustained CMIF goals met Perceived effectiveness: Described as relatively effective, allowing for increase in activities of daily living (ADL) Undesirable effects: Side-effects or Adverse reactions: None reported Monitoring: [place] PMP: Online review of the past 69-monthperiod conducted. Compliant with practice rules and regulations Last UDS on record: Summary  Date Value Ref Range Status  09/07/2017 FINAL  Final    Comment:    ==================================================================== TOXASSURE COMP DRUG ANALYSIS,UR ==================================================================== Test                             Result       Flag       Units Drug Present and Declared for Prescription Verification   Norhydrocodone                 189          EXPECTED   ng/mg creat    Norhydrocodone is an expected metabolite of hydrocodone.   Gabapentin                     PRESENT      EXPECTED   Venlafaxine                    PRESENT      EXPECTED   Desmethylvenlafaxine           PRESENT      EXPECTED    Desmethylvenlafaxine is an expected metabolite of venlafaxine.   Acetaminophen                  PRESENT      EXPECTED   Metoprolol                     PRESENT       EXPECTED Drug Absent but Declared for Prescription Verification   Hydrocodone                    Not Detected UNEXPECTED ng/mg creat    Hydrocodone is almost always present in patients taking this drug    consistently. Absence of hydrocodone could be due to lapse of    time since the last dose or unusual pharmacokinetics (rapid    metabolism).   Tizanidine                     Not Detected UNEXPECTED    Tizanidine, as indicated in the declared medication list, is not    always detected even when used as directed. ==================================================================== Test                      Result    Flag   Units      Ref Range   Creatinine  35               mg/dL      >=20 ==================================================================== Declared Medications:  The flagging and interpretation on this report are based on the  following declared medications.  Unexpected results may arise from  inaccuracies in the declared medications.  **Note: The testing scope of this panel includes these medications:  Gabapentin  Hydrocodone (Hydrocodone-Acetaminophen)  Metoprolol  Venlafaxine  **Note: The testing scope of this panel does not include small to  moderate amounts of these reported medications:  Acetaminophen (Hydrocodone-Acetaminophen)  Tizanidine (Zanaflex)  **Note: The testing scope of this panel does not include following  reported medications:  Albuterol  Betamethasone (Diprolene)  Cyanocobalamin  Divalproex (Depakote)  Hydrochlorothiazide  Hydrocortisone  Ketoconazole (Nizoral)  Omeprazole (Prilosec)  Oxybutynin  Oxygen  Supplement (Omega-3)  Tiotropium (Spiriva)  Tolterodine (Detrol LA) ==================================================================== For clinical consultation, please call [phone]. ====================================================================    UDS interpretation: Compliant          Medication  Assessment Form: Reviewed. Patient indicates being compliant with therapy Treatment compliance: Compliant Risk Assessment Profile: Aberrant behavior: See prior evaluations. None observed or detected today Comorbid factors increasing risk of overdose: See prior notes. No additional risks detected today Risk of substance use disorder (SUD): Low Opioid Risk Tool - 12/15/17 1211      Family History of Substance Abuse   Alcohol  Negative    Illegal Drugs  Negative      Personal History of Substance Abuse   Alcohol  Negative    Illegal Drugs  Negative    Rx Drugs  Negative      Age   Age between 86-45 years   No      History of Preadolescent Sexual Abuse   History of Preadolescent Sexual Abuse  Negative or Female      Psychological Disease   Psychological Disease  Negative    ADD  Negative    OCD  Negative    Bipolar  Negative    Schizophrenia  Negative    Depression  Positive      Total Score   Opioid Risk Tool Scoring  1    Opioid Risk Interpretation  Low Risk      ORT Scoring interpretation table:  Score <3 = Low Risk for SUD  Score between 4-7 = Moderate Risk for SUD  Score >8 = High Risk for Opioid Abuse   Risk Mitigation Strategies:  Patient Counseling: Covered Patient-Prescriber Agreement (PPA): Present and active  Notification to other healthcare providers: Done  Pharmacologic Plan: Increase from hydrocodone 7.5 mg twice daily to 7.5 mill grams 3 times daily as needed.             Laboratory Chemistry  Inflammation Markers (CRP: Acute Phase) (ESR: Chronic Phase) No results found for: CRP, ESRSEDRATE, LATICACIDVEN                       Rheumatology Markers No results found for: RF, ANA, [facility], LYMEIGGIGMAB, [facility]                      Renal Function Markers Lab Results  Component Value Date   BUN 10 12/06/2017   CREATININE 0.90 12/06/2017   GFRAA 79 12/06/2017   GFRNONAA 69 12/06/2017                              Hepatic  Function  Markers Lab Results  Component Value Date   AST 39 (H) 12/06/2017   ALT 35 (H) 12/06/2017   ALBUMIN 4.3 03/03/2017   ALKPHOS 53 03/03/2017   LIPASE 27 08/03/2017                        Electrolytes Lab Results  Component Value Date   NA 140 12/06/2017   K 3.9 12/06/2017   CL 100 12/06/2017   CALCIUM 9.8 12/06/2017                        Neuropathy Markers Lab Results  Component Value Date   HGBA1C 6.1 (H) 08/29/2017                        Bone Pathology Markers No results found for: VD25OH, [id number], ZY6063KZ6, WF0932TF5, 25OHVITD1, 25OHVITD2, 25OHVITD3, TESTOFREE, TESTOSTERONE                       Coagulation Parameters Lab Results  Component Value Date   INR 1.00 09/12/2017   LABPROT 13.1 09/12/2017   APTT 30 09/12/2017   PLT 262 08/03/2017                        Cardiovascular Markers Lab Results  Component Value Date   CKTOTAL 128 06/08/2012   CKMB 2.2 06/08/2012   TROPONINI < 0.02 06/08/2012   HGB 12.8 08/03/2017   HCT 37.0 08/03/2017                         CA Markers No results found for: CEA, CA125, LABCA2                      Note: Lab results reviewed.  Recent Diagnostic Imaging Results  DG C-Arm 1-60 Min-No Report Fluoroscopy was utilized by the requesting physician.  No radiographic  interpretation.   Complexity Note: Imaging results reviewed. Results shared with Ms. Clayburn, using Layman's terms.                         Meds   Current Outpatient Medications:  .  albuterol (PROVENTIL HFA;VENTOLIN HFA) 108 (90 Base) MCG/ACT inhaler, Inhale 2 puffs into the lungs every 6 (six) hours as needed for wheezing or shortness of breath., Disp: , Rfl:  .  betamethasone dipropionate (DIPROLENE) 0.05 % cream, Apply 1 application topically 2 (two) times daily as needed., Disp: , Rfl:  .  budesonide-formoterol (SYMBICORT) 160-4.5 MCG/ACT inhaler, Inhale 2 puffs into the lungs 2 (two) times daily., Disp: , Rfl:  .  divalproex (DEPAKOTE) 250 MG DR  tablet, Take 1 tablet (250 mg total) by mouth at bedtime., Disp: 30 tablet, Rfl: 5 .  fluticasone (FLONASE) 50 MCG/ACT nasal spray, Place 2 sprays into the nose daily., Disp: , Rfl:  .  gabapentin (NEURONTIN) 300 MG capsule, Take 1 capsule (300 mg total) by mouth 3 (three) times daily., Disp: 90 capsule, Rfl: 3 .  hydrochlorothiazide (HYDRODIURIL) 25 MG tablet, Take 1 tablet (25 mg total) by mouth daily., Disp: 30 tablet, Rfl: 5 .  HYDROcodone-acetaminophen (NORCO) 7.5-325 MG tablet, Take 1 tablet by mouth 3 (three) times daily as needed for moderate pain. For chronic pain To fill on or after: 12/16/17, 01/15/18, 02/13/18 To last for 30 days from fill date,  Disp: 90 tablet, Rfl: 0 .  hydrocortisone 2.5 % cream, Apply 1 application topically 2 (two) times daily., Disp: , Rfl:  .  ketoconazole (NIZORAL) 2 % cream, Apply 1 application topically 2 (two) times daily., Disp: , Rfl:  .  loratadine (CLARITIN) 10 MG tablet, Take 1 tablet (10 mg total) by mouth daily., Disp: 30 tablet, Rfl: 5 .  metoprolol succinate (TOPROL-XL) 50 MG 24 hr tablet, Take 1 tablet (50 mg total) by mouth daily., Disp: 30 tablet, Rfl: 5 .  mirabegron ER (MYRBETRIQ) 50 MG TB24 tablet, Take 1 tablet (50 mg total) by mouth daily., Disp: 30 tablet, Rfl: 11 .  omega-3 acid ethyl esters (LOVAZA) 1 g capsule, Take 2 capsules (2 g total) 2 (two) times daily by mouth., Disp: 120 capsule, Rfl: 5 .  omeprazole (PRILOSEC) 40 MG capsule, Take 1 capsule (40 mg total) by mouth daily., Disp: 30 capsule, Rfl: 5 .  OXYGEN, Inhale 2 L into the lungs continuous. , Disp: , Rfl:  .  tiotropium (SPIRIVA) 18 MCG inhalation capsule, Place 18 mcg into inhaler and inhale daily. , Disp: , Rfl:  .  tiZANidine (ZANAFLEX) 4 MG tablet, Take 1 tablet (4 mg total) by mouth every 8 (eight) hours as needed., Disp: 90 tablet, Rfl: 3 .  venlafaxine XR (EFFEXOR-XR) 150 MG 24 hr capsule, Take 1 capsule (150 mg total) by mouth daily with breakfast., Disp: 30 capsule, Rfl:  5  ROS  Constitutional: Denies any fever or chills Gastrointestinal: No reported hemesis, hematochezia, vomiting, or acute GI distress Musculoskeletal: Denies any acute onset joint swelling, redness, loss of ROM, or weakness Neurological: No reported episodes of acute onset apraxia, aphasia, dysarthria, agnosia, amnesia, paralysis, loss of coordination, or loss of consciousness  Allergies  Ms. Stickle is allergic to dapsone; sulfa antibiotics; and sulfasalazine.  PFSH  Drug: Ms. Apgar  reports that she does not use drugs. Alcohol:  reports that she does not drink alcohol. Tobacco:  reports that she quit smoking about 11 years ago. Her smoking use included cigarettes. She started smoking about 54 years ago. She has a 32.00 pack-year smoking history. She has never used smokeless tobacco. Medical:  has a past medical history of Anemia, Anxiety, Arthritis, Colon cancer ([place]) (9794), Complication of anesthesia, Constipation, COPD (chronic obstructive pulmonary disease) ([place]), Depression, Dysrhythmia, GERD (gastroesophageal reflux disease), H/O allergic rhinitis, Heart murmur, Hemorrhoid, Hyperlipidemia, Hypertension, Neck pain, chronic, Neuritis, Ovarian failure, Personal history of chemotherapy, Personal history of radiation therapy, PONV (postoperative nausea and vomiting), Pre-diabetes (2019), Proteinuria, Psoriasis, Sleep apnea (09/12/2016), Tachycardia, paroxysmal ([place]), and Urinary incontinence. Surgical: Ms. Rochon  has a past surgical history that includes Tubal ligation; tonsillectomy (1959); herniated disc repair (2001); Anterior fusion lumbar spine (1990); Neck surgery; Mass excision (Left, 02/23/2016); Colonoscopy (2018); Portacath placement (Left, 03/12/2016); Cardiac catheterization (2009? ); Cardiac catheterization (05/2012); Port-a-cath removal (2018); Liver biopsy (N/A, 09/12/2017); and Cholecystectomy (N/A, 09/12/2017). Family: family history includes Asthma in her mother; Breast cancer in her  maternal aunt; Heart attack in her mother; Liver cancer in her father.  Constitutional Exam  General appearance: Well nourished, well developed, and well hydrated. In no apparent acute distress Vitals:   12/15/17 1202  BP: (!) 158/102  Pulse: 89  Resp: 18  Temp: 98.4 F (36.9 C)  TempSrc: Oral  SpO2: 95%  Weight: 180 lb (81.6 kg)  Height: '5\' 4"'  (1.626 m)   BMI Assessment: Estimated body mass index is 30.9 kg/m as calculated from the following:   Height as of  this encounter: '5\' 4"'  (1.626 m).   Weight as of this encounter: 180 lb (81.6 kg).  BMI interpretation table: BMI level Category Range association with higher incidence of chronic pain  <18 kg/m2 Underweight   18.5-24.9 kg/m2 Ideal body weight   25-29.9 kg/m2 Overweight Increased incidence by 20%  30-34.9 kg/m2 Obese (Class I) Increased incidence by 68%  35-39.9 kg/m2 Severe obesity (Class II) Increased incidence by 136%  >40 kg/m2 Extreme obesity (Class III) Increased incidence by 254%   Patient's current BMI Ideal Body weight  Body mass index is 30.9 kg/m. Ideal body weight: 54.7 kg (120 lb 9.5 oz) Adjusted ideal body weight: 65.5 kg (144 lb 5.7 oz)   BMI Readings from Last 4 Encounters:  12/15/17 30.90 kg/m  12/06/17 30.90 kg/m  11/23/17 31.21 kg/m  11/09/17 30.90 kg/m   Wt Readings from Last 4 Encounters:  12/15/17 180 lb (81.6 kg)  12/06/17 180 lb (81.6 kg)  11/23/17 181 lb 12.8 oz (82.5 kg)  11/09/17 180 lb (81.6 kg)  Psych/Mental status: Alert, oriented x 3 (person, place, & time)       Eyes: PERLA Respiratory: No evidence of acute respiratory distress   Cervical Spine Area Exam  Skin & Axial Inspection: No masses, redness, edema, swelling, or associated skin lesions Alignment: Symmetrical Functional ROM: Decreased ROM, bilaterally L>R Stability: No instability detected Muscle Tone/Strength: Functionally intact. No obvious neuro-muscular anomalies detected. Sensory (Neurological): Articular pain  pattern Palpation: Complains of area being tender to palpation Positive provocative maneuver for for cervical facet   Upper Extremity (UE) Exam    Side: Right upper extremity  Side: Left upper extremity  Skin & Extremity Inspection: Skin color, temperature, and hair growth are WNL. No peripheral edema or cyanosis. No masses, redness, swelling, asymmetry, or associated skin lesions. No contractures.  Skin & Extremity Inspection: Skin color, temperature, and hair growth are WNL. No peripheral edema or cyanosis. No masses, redness, swelling, asymmetry, or associated skin lesions. No contractures.  Functional ROM: Unrestricted ROM          Functional ROM: Unrestricted ROM          Muscle Tone/Strength: Functionally intact. No obvious neuro-muscular anomalies detected.  Muscle Tone/Strength: Functionally intact. No obvious neuro-muscular anomalies detected.  Sensory (Neurological): Unimpaired          Sensory (Neurological): Unimpaired          Palpation: No palpable anomalies              Palpation: No palpable anomalies              Specialized Test(s): Deferred         Specialized Test(s): Deferred          Thoracic Spine Area Exam  Skin & Axial Inspection: No masses, redness, or swelling Alignment: Symmetrical Functional ROM: Unrestricted ROM Stability: No instability detected Muscle Tone/Strength: Functionally intact. No obvious neuro-muscular anomalies detected. Sensory (Neurological): Unimpaired Muscle strength & Tone: No palpable anomalies  Lumbar Spine Area Exam  Skin & Axial Inspection: No masses, redness, or swelling Alignment: Symmetrical Functional ROM: Unrestricted ROM       Stability: No instability detected Muscle Tone/Strength: Functionally intact. No obvious neuro-muscular anomalies detected. Sensory (Neurological): Unimpaired Palpation: No palpable anomalies       Provocative Tests: Lumbar Hyperextension and rotation test: evaluation deferred today       Lumbar Lateral  bending test: evaluation deferred today       Patrick's Maneuver: evaluation deferred today  Gait & Posture Assessment  Ambulation: Unassisted Gait: Relatively normal for age and body habitus Posture: WNL   Lower Extremity Exam    Side: Right lower extremity  Side: Left lower extremity  Stability: No instability observed          Stability: No instability observed          Skin & Extremity Inspection: Skin color, temperature, and hair growth are WNL. No peripheral edema or cyanosis. No masses, redness, swelling, asymmetry, or associated skin lesions. No contractures.  Skin & Extremity Inspection: Skin color, temperature, and hair growth are WNL. No peripheral edema or cyanosis. No masses, redness, swelling, asymmetry, or associated skin lesions. No contractures.  Functional ROM: Unrestricted ROM                  Functional ROM: Unrestricted ROM                  Muscle Tone/Strength: Functionally intact. No obvious neuro-muscular anomalies detected.  Muscle Tone/Strength: Functionally intact. No obvious neuro-muscular anomalies detected.  Sensory (Neurological): Unimpaired  Sensory (Neurological): Unimpaired  Palpation: No palpable anomalies  Palpation: No palpable anomalies   Assessment  Primary Diagnosis & Pertinent Problem List: The primary encounter diagnosis was Spondylosis of cervical region without myelopathy or radiculopathy. Diagnoses of Cervical facet joint syndrome, Chronic pain syndrome, Osteoarthritis of spine with radiculopathy, cervical region, Chronic cervical pain, and Cervicalgia were also pertinent to this visit.  Status Diagnosis  Persistent Persistent Persistent 1. Spondylosis of cervical region without myelopathy or radiculopathy   2. Cervical facet joint syndrome   3. Chronic pain syndrome   4. Osteoarthritis of spine with radiculopathy, cervical region   5. Chronic cervical pain   6. Cervicalgia     General Recommendations: The pain condition  that the patient suffers from is best treated with a multidisciplinary approach that involves an increase in physical activity to prevent de-conditioning and worsening of the pain cycle, as well as psychological counseling (formal and/or informal) to address the co-morbid psychological affects of pain. Treatment will often involve judicious use of pain medications and interventional procedures to decrease the pain, allowing the patient to participate in the physical activity that will ultimately produce long-lasting pain reductions. The goal of the multidisciplinary approach is to return the patient to a higher level of overall function and to restore their ability to perform activities of daily living.  62 year old female with history of axial neck pain that radiates into bilateral shoulders, left greater than right secondary to cervical spondylosis and cervical facet arthropathy most pronounced at C4, 5, 6, 7 status post 2 series of cervical facet medial branch nerve blocks on 09/28/2017, 11/09/2017 who is scheduled for cervical radiofrequency ablation in the upcoming weeks.  Today she presents for medication refill of hydrocodone.  At her last procedure visit, given worsening neck pain, we discussed increasing her hydrocodone from 7.5 mill grams twice daily to 7.5 mg 3 times daily as needed.  Patient instructed to continue gabapentin, tizanidine and Effexor as prescribed.  No prescription needed.  Plan: -Left C4, 5, 6, 7 RFA scheduled for end of May.  Will complete right side 2 weeks after we complete left -Hydrocodone prescription 7.5 mg 3 times daily as needed, quantity 43-month -continue gabapentin, tizanidine and Effexor as prescribed.  No prescription needed.   Plan of Care  Pharmacotherapy (Medications Ordered): Meds ordered this encounter  Medications  . DISCONTD: HYDROcodone-acetaminophen (NORCO) 7.5-325 MG tablet    Sig: Take 1 tablet by  mouth 2 (two) times daily as needed for moderate pain.  For chronic pain To fill on or after: 12/25/17, 01/24/18, 02/22/18 To last for 30 days from fill date    Dispense:  60 tablet    Refill:  0    Do not place this medication, or any other prescription from our practice, on "Automatic Refill". Patient may have prescription filled one day early if pharmacy is closed on scheduled refill date.  Marland Kitchen DISCONTD: HYDROcodone-acetaminophen (NORCO) 7.5-325 MG tablet    Sig: Take 1 tablet by mouth 2 (two) times daily as needed for moderate pain. For chronic pain To fill on or after: 12/25/17, 01/24/18, 02/22/18 To last for 30 days from fill date    Dispense:  60 tablet    Refill:  0    Do not place this medication, or any other prescription from our practice, on "Automatic Refill". Patient may have prescription filled one day early if pharmacy is closed on scheduled refill date.  Marland Kitchen DISCONTD: HYDROcodone-acetaminophen (NORCO) 7.5-325 MG tablet    Sig: Take 1 tablet by mouth 2 (two) times daily as needed for moderate pain. For chronic pain To fill on or after: 12/25/17, 01/24/18, 02/22/18 To last for 30 days from fill date    Dispense:  60 tablet    Refill:  0    Do not place this medication, or any other prescription from our practice, on "Automatic Refill". Patient may have prescription filled one day early if pharmacy is closed on scheduled refill date.  Marland Kitchen DISCONTD: HYDROcodone-acetaminophen (NORCO) 7.5-325 MG tablet    Sig: Take 1 tablet by mouth 3 (three) times daily as needed for moderate pain. For chronic pain To fill on or after: 12/16/17, 01/15/18, 02/13/18 To last for 30 days from fill date    Dispense:  90 tablet    Refill:  0    Do not place this medication, or any other prescription from our practice, on "Automatic Refill". Patient may have prescription filled one day early if pharmacy is closed on scheduled refill date.  Marland Kitchen DISCONTD: HYDROcodone-acetaminophen (NORCO) 7.5-325 MG tablet    Sig: Take 1 tablet by mouth 3 (three) times daily as needed for  moderate pain. For chronic pain To fill on or after: 12/16/17, 01/15/18, 02/13/18 To last for 30 days from fill date    Dispense:  90 tablet    Refill:  0    Do not place this medication, or any other prescription from our practice, on "Automatic Refill". Patient may have prescription filled one day early if pharmacy is closed on scheduled refill date.  Marland Kitchen HYDROcodone-acetaminophen (NORCO) 7.5-325 MG tablet    Sig: Take 1 tablet by mouth 3 (three) times daily as needed for moderate pain. For chronic pain To fill on or after: 12/16/17, 01/15/18, 02/13/18 To last for 30 days from fill date    Dispense:  90 tablet    Refill:  0    Do not place this medication, or any other prescription from our practice, on "Automatic Refill". Patient may have prescription filled one day early if pharmacy is closed on scheduled refill date.   Provider-requested follow-up: Return in about 3 months (around 03/17/2018) for Medication Management.  Time Note: Greater than 50% of the 25 minute(s) of face-to-face time spent with Ms. Lowy, was spent in counseling/coordination of care regarding: Ms. Anes primary cause of pain, the treatment plan, medication side effects, the opioid analgesic risks and possible complications, the appropriate use of her medications,  the goals of pain management (increased in functionality), the medication agreement and the patient's responsibilities when it comes to controlled substances.  Future Appointments  Date Time Provider [place]  01/04/2018  8:00 AM Gillis Santa, MD [facility] None  01/09/2018  2:45 PM [facility] None  02/22/2018  2:20 PM Steele Sizer, MD [facility]  03/14/2018 12:00 PM Gillis Santa, MD [facility] None  05/11/2018  3:00 PM Rubie Maid, MD [facility] None    Primary Care Physician: Steele Sizer, MD Location: [facility] Note by: Gillis Santa, M.D Date: 12/15/2017; Time: 1:05 PM  There are no Patient  Instructions on file for this visit.

## 2017-12-15 NOTE — Progress Notes (Signed)
Nursing Pain Medication Assessment:  Safety precautions to be maintained throughout the outpatient stay will include: orient to surroundings, keep bed in low position, maintain call bell within reach at all times, provide assistance with transfer out of bed and ambulation.  Medication Inspection Compliance: Pill count conducted under aseptic conditions, in front of the patient. Neither the pills nor the bottle was removed from the patient's sight at any time. Once count was completed pills were immediately returned to the patient in their original bottle.  Medication: Hydrocodone/APAP Pill/Patch Count: 11 of 60 pills remain Pill/Patch Appearance: Markings consistent with prescribed medication Bottle Appearance: Standard pharmacy container. Clearly labeled. Filled Date: 4 / 20 / 2019 Last Medication intake:  Today

## 2018-01-04 ENCOUNTER — Ambulatory Visit: Payer: Medicaid Other | Admitting: Student in an Organized Health Care Education/Training Program

## 2018-01-09 ENCOUNTER — Ambulatory Visit (INDEPENDENT_AMBULATORY_CARE_PROVIDER_SITE_OTHER): Payer: Medicaid Other | Admitting: Urology

## 2018-01-09 ENCOUNTER — Encounter: Payer: Self-pay | Admitting: Urology

## 2018-01-09 VITALS — BP 130/79 | HR 98 | Ht 64.0 in | Wt 183.2 lb

## 2018-01-09 DIAGNOSIS — N3946 Mixed incontinence: Secondary | ICD-10-CM | POA: Diagnosis not present

## 2018-01-09 MED ORDER — MIRABEGRON ER 50 MG PO TB24: 50.0000 mg | ORAL_TABLET | Freq: Every day | ORAL | 11 refills | Status: DC

## 2018-01-09 NOTE — Progress Notes (Signed)
01/09/2018 2:47 PM   Judy Allen 22-Aug-1955 [id number]  Referring provider: Steele Sizer, MD [address] [place],  [address]  No chief complaint on file.   HPI: I was consulted to assess the patient's urinary incontinence worsening over a few months. In the last year or so she had anal cancer with chemotherapy and radiation but no surgery. She can leak without awareness but also describes urgency and trying to undress prior to urinating. She denies stress incontinence. She denied bedwetting but then noted a foul-smelling discharge from the urethra she thinks. She wears 2 pads a day sometimes damp sometimes moderately wet  She has had 2 neck surgery and one lower back operation. She has not had a hysterectomy  GU:On pelvic examination the patient had moderately severe vaginal atrophy. I saw no vaginitis discharge blood or urine in the vaginal vault. She had a grade 1 or small grade 2 cystocele. She had elevated tissue underneath the urethra that was nontender and she probably does not have a urethral diverticulum but she could.   The patient has urinary incontinence not associated with awareness. She denies stress incontinence but I believe she also has urgency incontinence. She has had pelvic radiation. Her degree of leakage and pelvic examis not in keeping with the fistula. The patient did not have blood in the urine but I sent the urine for culture.   The patient was 80% improved on Myrbetriq but Medicaid will not cover it.   Cystoscopy: normal  The patient's incontinence is almost back to baseline. Lisbeth Ply did not work. Once again she said she was doing great on Myrbetriq but she says she does not think Medicaid covers it.  I gave her 2 prescriptions one for Detrol LA 4 mg and oxybutynin ER 10 mg and reassess in 8 weeks. I might order urodynamics. She is on home oxygen. I do not think InterStim with her COPD may not be a good option.  With her radiation I do not think Botox is a very good option. Percutaneous tibial nerve stimulation is an option  The patient failed oxybutynin and tolterodine.  The patient has refractory urgency incontinence and leakage without awareness and is done beautifully on Myrbetriq.  I gave her samples and prescription.  Because of radiation she is not a good candidate for Botox.  Because a home oxygen and COPD she is not a good candidate in my opinion for InterStim.  I do not think that Medicaid will cover percutaneous tibial nerve stimulation.  More samples and prescription given.  Hopefully with step therapy Medicaid will cover the only medication that is working for her.    Today Frequency and urge incontinence 80% better on Myrbetriq now covered with step therapy.  Clinically no infection  PMH: Past Medical History:  Diagnosis Date  . Anemia    H/O  . Anxiety   . Arthritis   . Colon cancer ([place]) 2017   anal ca/chemo and rad  . Complication of anesthesia   . Constipation   . COPD (chronic obstructive pulmonary disease) ([facility])    also emphysema  . Depression   . Dysrhythmia    TACHYCARDIA-WELL CONTROLLED ON METOPROLO  . GERD (gastroesophageal reflux disease)   . H/O allergic rhinitis   . Heart murmur   . Hemorrhoid   . Hyperlipidemia   . Hypertension   . Neck pain, chronic   . Neuritis   . Ovarian failure   . Personal history of chemotherapy   .  Personal history of radiation therapy   . PONV (postoperative nausea and vomiting)   . Pre-diabetes 2019   just being followed.  no meds  . Proteinuria   . Psoriasis   . Sleep apnea 09/12/2016   8cm H2O with 21pm oxygen mask: Eson size Medium. Heated humidifier for nasal dryness.  . Tachycardia, paroxysmal ([place])   . Urinary incontinence     Surgical History: Past Surgical History:  Procedure Laterality Date  . ANTERIOR FUSION LUMBAR SPINE  1990   plate in back, not metal  . CARDIAC CATHETERIZATION  2009?    [facility]  .  CARDIAC CATHETERIZATION  05/2012   [facility]: Mild hypertension 37/12 with a mean pressure of 21 mm mercury  . CHOLECYSTECTOMY N/A 09/12/2017   Procedure: LAPAROSCOPIC CHOLECYSTECTOMY WITH INTRAOPERATIVE CHOLANGIOGRAM;  Surgeon: Robert Bellow, MD;  Location: [facility];  Service: General;  Laterality: N/A;  . COLONOSCOPY  2018  . herniated disc repair  2001  . LIVER BIOPSY N/A 09/12/2017   Procedure: LIVER BIOPSY;  Surgeon: Robert Bellow, MD;  Location: [facility];  Service: General;  Laterality: N/A;  . MASS EXCISION Left 02/23/2016   Procedure: EXCISION MASS;  Surgeon: Christene Lye, MD;  Location: [facility];  Service: General;  Laterality: Left;  . NECK SURGERY    . PORT-A-CATH REMOVAL  2018  . PORTACATH PLACEMENT Left 03/12/2016   Procedure: INSERTION PORT-A-CATH;  Surgeon: Christene Lye, MD;  Location: [facility];  Service: General;  Laterality: Left;  . tonsillectomy  1959   history  . TUBAL LIGATION     s/p    Home Medications:  Allergies as of 01/09/2018      Reactions   Dapsone Other (See Comments)   Hypoxemia   Sulfa Antibiotics Rash   Sulfasalazine Rash      Medication List        Accurate as of 01/09/18  2:47 PM. Always use your most recent med list.          albuterol 108 (90 Base) MCG/ACT inhaler Commonly known as:  PROVENTIL HFA;VENTOLIN HFA Inhale 2 puffs into the lungs every 6 (six) hours as needed for wheezing or shortness of breath.   betamethasone dipropionate 0.05 % cream Commonly known as:  DIPROLENE Apply 1 application topically 2 (two) times daily as needed.   divalproex 250 MG DR tablet Commonly known as:  DEPAKOTE Take 1 tablet (250 mg total) by mouth at bedtime.   fluticasone 50 MCG/ACT nasal spray Commonly known as:  FLONASE Place 2 sprays into the nose daily.   gabapentin 300 MG capsule Commonly known as:  NEURONTIN Take 1 capsule (300 mg total) by mouth 3 (three) times daily.   hydrochlorothiazide 25 MG tablet Commonly known  as:  HYDRODIURIL Take 1 tablet (25 mg total) by mouth daily.   HYDROcodone-acetaminophen 7.5-325 MG tablet Commonly known as:  NORCO Take 1 tablet by mouth 3 (three) times daily as needed for moderate pain. For chronic pain To fill on or after: 12/16/17, 01/15/18, 02/13/18 To last for 30 days from fill date   hydrocortisone 2.5 % cream Apply 1 application topically 2 (two) times daily.   hydrOXYzine 25 MG tablet Commonly known as:  ATARAX/VISTARIL TK 1 T PO QHS   ketoconazole 2 % cream Commonly known as:  NIZORAL Apply 1 application topically 2 (two) times daily.   loratadine 10 MG tablet Commonly known as:  CLARITIN Take 1 tablet (10 mg total) by mouth daily.   metoprolol  succinate 50 MG 24 hr tablet Commonly known as:  TOPROL-XL Take 1 tablet (50 mg total) by mouth daily.   mirabegron ER 50 MG Tb24 tablet Commonly known as:  MYRBETRIQ Take 1 tablet (50 mg total) by mouth daily.   omega-3 acid ethyl esters 1 g capsule Commonly known as:  LOVAZA Take 2 capsules (2 g total) 2 (two) times daily by mouth.   omeprazole 40 MG capsule Commonly known as:  PRILOSEC Take 1 capsule (40 mg total) by mouth daily.   OXYGEN Inhale 2 L into the lungs continuous.   SYMBICORT 160-4.5 MCG/ACT inhaler Generic drug:  budesonide-formoterol Inhale 2 puffs into the lungs 2 (two) times daily.   tiotropium 18 MCG inhalation capsule Commonly known as:  SPIRIVA Place 18 mcg into inhaler and inhale daily.   tiZANidine 4 MG tablet Commonly known as:  ZANAFLEX Take 1 tablet (4 mg total) by mouth every 8 (eight) hours as needed.   triamcinolone cream 0.1 % Commonly known as:  KENALOG APP TOPICALLY AA BID   venlafaxine XR 150 MG 24 hr capsule Commonly known as:  EFFEXOR-XR Take 1 capsule (150 mg total) by mouth daily with breakfast.       Allergies:  Allergies  Allergen Reactions  . Dapsone Other (See Comments)    Hypoxemia  . Sulfa Antibiotics Rash  . Sulfasalazine Rash     Family History: Family History  Problem Relation Age of Onset  . Heart attack Mother   . Asthma Mother   . Liver cancer Father   . Breast cancer Maternal Aunt   . Bladder Cancer Neg Hx   . Kidney cancer Neg Hx     Social History:  reports that she quit smoking about 11 years ago. Her smoking use included cigarettes. She started smoking about 54 years ago. She has a 32.00 pack-year smoking history. She has never used smokeless tobacco. She reports that she does not drink alcohol or use drugs.  ROS: UROLOGY Frequent Urination?: No Hard to postpone urination?: No Burning/pain with urination?: No Get up at night to urinate?: Yes Leakage of urine?: Yes Urine stream starts and stops?: No Trouble starting stream?: No Do you have to strain to urinate?: No Blood in urine?: No Urinary tract infection?: No Sexually transmitted disease?: No Injury to kidneys or bladder?: No Painful intercourse?: No Weak stream?: No Currently pregnant?: No Vaginal bleeding?: No Last menstrual period?: n  Gastrointestinal Nausea?: No Vomiting?: No Indigestion/heartburn?: No Diarrhea?: No Constipation?: No  Constitutional Fever: No Night sweats?: No Weight loss?: No Fatigue?: No  Skin Skin rash/lesions?: Yes Itching?: No  Eyes Blurred vision?: No Double vision?: No  Ears/Nose/Throat Sore throat?: No Sinus problems?: No  Hematologic/Lymphatic Swollen glands?: No Easy bruising?: No  Cardiovascular Leg swelling?: No Chest pain?: No  Respiratory Cough?: No Shortness of breath?: Yes  Endocrine Excessive thirst?: No  Musculoskeletal Back pain?: Yes Joint pain?: No  Neurological Headaches?: No Dizziness?: No  Psychologic Depression?: Yes Anxiety?: Yes  Physical Exam: BP 130/79 (BP Location: Right Arm, Patient Position: Sitting, Cuff Size: Normal)   Pulse 98   Ht 5\' 4"  (1.626 m)   Wt 83.1 kg (183 lb 3.2 oz)   LMP 03/04/2002 (Approximate)   BMI 31.45 kg/m    Constitutional:  Alert and oriented, No acute distress.   Laboratory Data: Lab Results  Component Value Date   WBC 7.8 08/03/2017   HGB 12.8 08/03/2017   HCT 37.0 08/03/2017   MCV 88.3 08/03/2017   PLT  262 08/03/2017    Lab Results  Component Value Date   CREATININE 0.90 12/06/2017    No results found for: PSA  No results found for: TESTOSTERONE  Lab Results  Component Value Date   HGBA1C 6.1 (H) 08/29/2017    Urinalysis    Component Value Date/Time   COLORURINE STRAW (A) 11/02/2015 1227   APPEARANCEUR Clear 07/04/2017 1047   LABSPEC 1.008 11/02/2015 1227   LABSPEC 1.005 06/08/2012 1117   PHURINE 7.0 11/02/2015 1227   GLUCOSEU Negative 07/04/2017 1047   GLUCOSEU Negative 06/08/2012 1117   HGBUR NEGATIVE 11/02/2015 1227   BILIRUBINUR Negative 07/04/2017 1047   BILIRUBINUR Negative 06/08/2012 1117   KETONESUR NEGATIVE 11/02/2015 1227   PROTEINUR Negative 07/04/2017 1047   PROTEINUR NEGATIVE 11/02/2015 1227   NITRITE Negative 07/04/2017 1047   NITRITE NEGATIVE 11/02/2015 1227   LEUKOCYTESUR Trace 07/04/2017 1047   LEUKOCYTESUR Negative 06/08/2012 1117    Pertinent Imaging:   Assessment & Plan: Prescription renewed and I will see her in 1 year  There are no diagnoses linked to this encounter.  No follow-ups on file.  Reece Packer, MD  [facility] [address] [place], [place] [address] [phone]

## 2018-01-18 ENCOUNTER — Other Ambulatory Visit: Payer: Self-pay | Admitting: Student in an Organized Health Care Education/Training Program

## 2018-01-18 ENCOUNTER — Other Ambulatory Visit: Payer: Self-pay | Admitting: Family Medicine

## 2018-01-18 ENCOUNTER — Ambulatory Visit
Admission: RE | Admit: 2018-01-18 | Discharge: 2018-01-18 | Disposition: A | Discharge status: Home / Self Care | Payer: Medicaid Other | Source: Ambulatory Visit | Attending: Student in an Organized Health Care Education/Training Program | Admitting: Student in an Organized Health Care Education/Training Program

## 2018-01-18 ENCOUNTER — Other Ambulatory Visit: Payer: Self-pay

## 2018-01-18 ENCOUNTER — Ambulatory Visit (HOSPITAL_BASED_OUTPATIENT_CLINIC_OR_DEPARTMENT_OTHER): Payer: Medicaid Other | Admitting: Student in an Organized Health Care Education/Training Program

## 2018-01-18 ENCOUNTER — Encounter: Payer: Self-pay | Admitting: Student in an Organized Health Care Education/Training Program

## 2018-01-18 VITALS — BP 132/76 | HR 82 | Temp 97.5°F | Resp 21 | Ht 64.0 in | Wt 182.0 lb

## 2018-01-18 DIAGNOSIS — Z9889 Other specified postprocedural states: Secondary | ICD-10-CM | POA: Diagnosis not present

## 2018-01-18 DIAGNOSIS — Z888 Allergy status to other drugs, medicaments and biological substances status: Secondary | ICD-10-CM | POA: Insufficient documentation

## 2018-01-18 DIAGNOSIS — M47812 Spondylosis without myelopathy or radiculopathy, cervical region: Secondary | ICD-10-CM | POA: Insufficient documentation

## 2018-01-18 DIAGNOSIS — Z90710 Acquired absence of both cervix and uterus: Secondary | ICD-10-CM | POA: Insufficient documentation

## 2018-01-18 DIAGNOSIS — M4326 Fusion of spine, lumbar region: Secondary | ICD-10-CM | POA: Diagnosis not present

## 2018-01-18 DIAGNOSIS — Z1231 Encounter for screening mammogram for malignant neoplasm of breast: Secondary | ICD-10-CM

## 2018-01-18 DIAGNOSIS — G8929 Other chronic pain: Secondary | ICD-10-CM | POA: Diagnosis not present

## 2018-01-18 DIAGNOSIS — M542 Cervicalgia: Principal | ICD-10-CM

## 2018-01-18 DIAGNOSIS — Z882 Allergy status to sulfonamides status: Secondary | ICD-10-CM | POA: Diagnosis not present

## 2018-01-18 DIAGNOSIS — Z9851 Tubal ligation status: Secondary | ICD-10-CM | POA: Diagnosis not present

## 2018-01-18 MED ORDER — LACTATED RINGERS IV SOLN
1000.0000 mL | Freq: Once | INTRAVENOUS | Status: AC
  Administered 2018-01-18: 1000 mL via INTRAVENOUS

## 2018-01-18 MED ORDER — LIDOCAINE HCL 1 % IJ SOLN
10.0000 mL | Freq: Once | INTRAMUSCULAR | Status: AC
  Administered 2018-01-18: 5 mL
  Filled 2018-01-18: qty 10

## 2018-01-18 MED ORDER — ROPIVACAINE HCL 2 MG/ML IJ SOLN
10.0000 mL | Freq: Once | INTRAMUSCULAR | Status: AC
  Administered 2018-01-18: 10 mL
  Filled 2018-01-18: qty 10

## 2018-01-18 MED ORDER — DEXAMETHASONE SODIUM PHOSPHATE 10 MG/ML IJ SOLN
10.0000 mg | Freq: Once | INTRAMUSCULAR | Status: AC
  Administered 2018-01-18: 10 mg
  Filled 2018-01-18: qty 1

## 2018-01-18 MED ORDER — FENTANYL CITRATE (PF) 100 MCG/2ML IJ SOLN
25.0000 ug | INTRAMUSCULAR | Status: DC | PRN
  Administered 2018-01-18: 75 ug via INTRAVENOUS
  Filled 2018-01-18: qty 2

## 2018-01-18 NOTE — Patient Instructions (Signed)

## 2018-01-18 NOTE — Progress Notes (Signed)
Safety precautions to be maintained throughout the outpatient stay will include: orient to surroundings, keep bed in low position, maintain call bell within reach at all times, provide assistance with transfer out of bed and ambulation.  

## 2018-01-18 NOTE — Progress Notes (Signed)
Patient's Name: Judy Allen  MRN: [id number]  Referring Provider: Steele Sizer, MD  DOB: [birth year]  PCP: Steele Sizer, MD  DOS: 01/18/2018  Note by: Gillis Santa, MD  Service setting: Ambulatory outpatient  Specialty: Interventional Pain Management  Patient type: Established  Location: [facility] (AMB) Pain Management Facility  Visit type: Interventional Procedure   Primary Reason for Visit: Interventional Pain Management Treatment. CC: Neck Pain  Procedure:       Anesthesia, Analgesia, Anxiolysis:  Type: Cervical Facet, Medial Branch nerve block Primary Purpose: Therapeutic Region: Posterolateral cervical spine region Level:  C4, C5, C6, & C7 Medial Branch Level(s). Lesioning of these levels should completely denervate the C3-4, C4-5, C5-6, and the C6-7 cervical facet joints. Laterality: Left Paraspinal  #Patient unable to tolerate sensory and motor testing, endorsing severe pain, aborted RFA and just proceeded with left cervical facet medial branch nerve block after discussion with patient.   Type: Moderate (Conscious) Sedation combined with Local Anesthesia Indication(s): Analgesia and Anxiety Route: Intravenous (IV) IV Access: Secured Sedation: Meaningful verbal contact was maintained at all times during the procedure  Local Anesthetic: Lidocaine 1%  Position: Prone with head of the table was raised to facilitate breathing.   Indications: 1. Spondylosis of cervical region without myelopathy or radiculopathy    Judy Allen has been dealing with the above chronic pain for longer than three months and has either failed to respond, was unable to tolerate, or simply did not get enough benefit from other more conservative therapies including, but not limited to: 1. Over-the-counter medications 2. Anti-inflammatory medications 3. Muscle relaxants 4. Membrane stabilizers 5. Opioids 6. Physical therapy 7. Modalities (Heat, ice, etc.) 8. Invasive techniques such as nerve blocks. [name] has attained more than 50% relief of the pain from a series of diagnostic injections conducted in separate occasions.  Pain Score: Pre-procedure: 6 /10 Post-procedure: 0-No pain/10  Pre-op Assessment:  Judy Allen is a 62 y.o. (year old), female patient, seen today for interventional treatment. She  has a past surgical history that includes Tubal ligation; tonsillectomy (7564); herniated disc repair (2001); Anterior fusion lumbar spine (1990); Neck surgery; Mass excision (Left, 02/23/2016); Colonoscopy (2018); Portacath placement (Left, 03/12/2016); Cardiac catheterization (2009? ); Cardiac catheterization (05/2012); Port-a-cath removal (2018); Liver biopsy (N/A, 09/12/2017); and Cholecystectomy (N/A, 09/12/2017). Judy Allen has a current medication list which includes the following prescription(s): albuterol, betamethasone dipropionate, budesonide-formoterol, divalproex, fluticasone, gabapentin, hydrochlorothiazide, hydrocodone-acetaminophen, hydrocortisone, ketoconazole, loratadine, metoprolol succinate, mirabegron er, omega-3 acid ethyl esters, omeprazole, oxygen-helium, tiotropium, tizanidine, triamcinolone cream, venlafaxine xr, and hydroxyzine, and the following Facility-Administered Medications: fentanyl. Her primarily concern today is the Neck Pain  Initial Vital Signs:  Pulse/HCG Rate: 82ECG Heart Rate: 85 Temp: 98 F (36.7 C) Resp: 18 BP: (!) 156/94 SpO2: 96 %  BMI: Estimated body mass index is 31.24 kg/m as calculated from the following:   Height as of this encounter: 5\' 4"  (1.626 m).   Weight as of this encounter: 182 lb (82.6 kg).  Risk Assessment: Allergies: Reviewed. She is allergic to dapsone; sulfa antibiotics; and sulfasalazine.  Allergy Precautions: None required Coagulopathies: Reviewed. None identified.  Blood-thinner therapy: None at this time Active Infection(s): Reviewed. None identified. Judy Allen is afebrile  Site Confirmation: Judy Allen was asked to confirm the  procedure and laterality before marking the site Procedure checklist: Completed Consent: Before the procedure and under the influence of no sedative(s), amnesic(s), or anxiolytics, the patient was informed of the treatment options, risks and possible complications. To fulfill our ethical  and legal obligations, as recommended by the American Medical Association's Code of Ethics, I have informed the patient of my clinical impression; the nature and purpose of the treatment or procedure; the risks, benefits, and possible complications of the intervention; the alternatives, including doing nothing; the risk(s) and benefit(s) of the alternative treatment(s) or procedure(s); and the risk(s) and benefit(s) of doing nothing. The patient was provided information about the general risks and possible complications associated with the procedure. These may include, but are not limited to: failure to achieve desired goals, infection, bleeding, organ or nerve damage, allergic reactions, paralysis, and death. In addition, the patient was informed of those risks and complications associated to Spine-related procedures, such as failure to decrease pain; infection (i.e.: Meningitis, epidural or intraspinal abscess); bleeding (i.e.: epidural hematoma, subarachnoid hemorrhage, or any other type of intraspinal or peri-dural bleeding); organ or nerve damage (i.e.: Any type of peripheral nerve, nerve root, or spinal cord injury) with subsequent damage to sensory, motor, and/or autonomic systems, resulting in permanent pain, numbness, and/or weakness of one or several areas of the body; allergic reactions; (i.e.: anaphylactic reaction); and/or death. Furthermore, the patient was informed of those risks and complications associated with the medications. These include, but are not limited to: allergic reactions (i.e.: anaphylactic or anaphylactoid reaction(s)); adrenal axis suppression; blood sugar elevation that in diabetics may result  in ketoacidosis or comma; water retention that in patients with history of congestive heart failure may result in shortness of breath, pulmonary edema, and decompensation with resultant heart failure; weight gain; swelling or edema; medication-induced neural toxicity; particulate matter embolism and blood vessel occlusion with resultant organ, and/or nervous system infarction; and/or aseptic necrosis of one or more joints. Finally, the patient was informed that Medicine is not an exact science; therefore, there is also the possibility of unforeseen or unpredictable risks and/or possible complications that may result in a catastrophic outcome. The patient indicated having understood very clearly. We have given the patient no guarantees and we have made no promises. Enough time was given to the patient to ask questions, all of which were answered to the patient's satisfaction. Judy Allen has indicated that she wanted to continue with the procedure. Attestation: I, the ordering provider, attest that I have discussed with the patient the benefits, risks, side-effects, alternatives, likelihood of achieving goals, and potential problems during recovery for the procedure that I have provided informed consent. Date  Time: 01/18/2018  7:47 AM  Pre-Procedure Preparation:  Monitoring: As per clinic protocol. Respiration, ETCO2, SpO2, BP, heart rate and rhythm monitor placed and checked for adequate function Safety Precautions: Patient was assessed for positional comfort and pressure points before starting the procedure. Time-out: I initiated and conducted the "Time-out" before starting the procedure, as per protocol. The patient was asked to participate by confirming the accuracy of the "Time Out" information. Verification of the correct person, site, and procedure were performed and confirmed by me, the nursing staff, and the patient. "Time-out" conducted as per Joint Commission's Universal Protocol  (UP.01.01.01). Time: 0827  Description of Procedure:       Laterality: Left Level: C4, C5, C6, & C7 Medial Branch Level(s). Area Prepped: Entire Posterior Cervico-thoracic Region Prepping solution: ChloraPrep (2% chlorhexidine gluconate and 70% isopropyl alcohol) Safety Precautions: Aspiration looking for blood return was conducted prior to all injections. At no point did we inject any substances, as a needle was being advanced. Before injecting, the patient was told to immediately notify me if she was experiencing any new onset  of "ringing in the ears, or metallic taste in the mouth". No attempts were made at seeking any paresthesias. Safe injection practices and needle disposal techniques used. Medications properly checked for expiration dates. SDV (single dose vial) medications used. After the completion of the procedure, all disposable equipment used was discarded in the proper designated medical waste containers. Local Anesthesia: Protocol guidelines were followed. The patient was positioned over the fluoroscopy table. The area was prepped in the usual manner. The time-out was completed. The target area was identified using fluoroscopy. A 12-in long, straight, sterile hemostat was used with fluoroscopic guidance to locate the targets for each level blocked. Once located, the skin was marked with an approved surgical skin marker. Once all sites were marked, the skin (epidermis, dermis, and hypodermis), as well as deeper tissues (fat, connective tissue and muscle) were infiltrated with a small amount of a short-acting local anesthetic, loaded on a 10cc syringe with a 25G, 1.5-in  Needle. An appropriate amount of time was allowed for local anesthetics to take effect before proceeding to the next step. Local Anesthetic: Lidocaine 1.0% The unused portion of the local anesthetic was discarded in the proper designated containers. Technical explanation of process:   Radiofrequency Ablation (RFA)  C4  Medial Branch Nerve RFA: The target area for the C4 dorsal medial articular branch is the lateral concave waist of the articular pillar of C4. Under fluoroscopic guidance, a Radiofrequency needle was inserted until contact was made with os over the postero-lateral aspect of the articular pillar of C4 (target area). Sensory and motor testing was conducted to properly adjust the position of the needle, however resulted in severe neck and shoulder pain. Needle was re-adjusted but patient continued to endorse pain with sensory and motor testing. Decision made to pursue facet block instead of RFA after discussion with patient (given concern for post ablation neuritis in context of patient having persistent pain during sensory and motor testing after multiple needle orientations)  C5 Medial Branch Nerve RFA: The target area for the C5 dorsal medial articular branch is the lateral concave waist of the articular pillar of C5. Under fluoroscopic guidance, a Radiofrequency needle was inserted until contact was made with os over the postero-lateral aspect of the articular pillar of C5 (target area). Sensory and motor testing was conducted to properly adjust the position of the needle, however resulted in severe neck and shoulder pain. Needle was re-adjusted but patient continued to endorse pain with sensory and motor testing. Decision made to pursue facet block instead of RFA after discussion with patient (given concern for post ablation neuritis in context of patient having persistent pain during sensory and motor testing after multiple needle orientations)  C6 Medial Branch Nerve RFA: The target area for the C6 dorsal medial articular branch is the lateral concave waist of the articular pillar of C6. Under fluoroscopic guidance, a Radiofrequency needle was inserted until contact was made with os over the postero-lateral aspect of the articular pillar of C6 (target area). Sensory and motor testing was conducted to  properly adjust the position of the needle, however resulted in severe neck and shoulder pain. Needle was re-adjusted but patient continued to endorse pain with sensory and motor testing. Decision made to pursue facet block instead of RFA after discussion with patient (given concern for post ablation neuritis in context of patient having persistent pain during sensory and motor testing after multiple needle orientations)  C7 Medial Branch Nerve RFA: The target for the C7 dorsal medial articular branch  lies on the superior-medial tip of the C7 transverse process. Under fluoroscopic guidance, a Radiofrequency needle was inserted until contact was made with os over the postero-lateral aspect of the articular pillar of C7 (target area). Sensory and motor testing was conducted to properly adjust the position of the needle, however resulted in severe neck and shoulder pain. Needle was re-adjusted but patient continued to endorse pain with sensory and motor testing. Decision made to pursue facet block instead of RFA after discussion with patient (given concern for post ablation neuritis in context of patient having persistent pain during sensory and motor testing after multiple needle orientations)   Needle(s) (Electrode/Cannula) Type: Teflon-coated, curved tip, Radiofrequency needle(s) Gauge: 22G Length: 10cm                 Injectate  Solution: 4 cc solution made of 3 cc of 0.2% ropivacaine, 1 cc of Decadron 10 mg/cc.  1 cc injected at each level above for cervical facet medial branch nerve block given patient's and ability to tolerate sensory and motor testing due to severe pain with multiple needle orientations and redirections.  Once the entire procedure was completed, the treated area was cleaned, making sure to leave some of the prepping solution back to take advantage of its long term bactericidal properties.  Intra-operative Compliance: Compliant  Vitals:   01/18/18 0901 01/18/18 0911 01/18/18 0921  01/18/18 0930  BP: (!) 140/92 131/83 140/89 132/76  Pulse:      Resp: 20 16 20  (!) 21  Temp:  98 F (36.7 C)  (!) 97.5 F (36.4 C)  SpO2: 94% 93% 93% 93%  Weight:      Height:        Start Time: 0827 hrs. End Time: 0901 hrs.  Imaging Guidance (Spinal):  Type of Imaging Technique: Fluoroscopy Guidance (Spinal) Indication(s): Assistance in needle guidance and placement for procedures requiring needle placement in or near specific anatomical locations not easily accessible without such assistance. Exposure Time: Please see nurses notes. Contrast: None used. Fluoroscopic Guidance: I was personally present during the use of fluoroscopy. "Tunnel Vision Technique" used to obtain the best possible view of the target area. Parallax error corrected before commencing the procedure. "Direction-depth-direction" technique used to introduce the needle under continuous pulsed fluoroscopy. Once target was reached, antero-posterior, oblique, and lateral fluoroscopic projection used confirm needle placement in all planes. Images permanently stored in EMR. Interpretation: No contrast injected. I personally interpreted the imaging intraoperatively. Adequate needle placement confirmed in multiple planes. Permanent images saved into the patient's record.  Antibiotic Prophylaxis:   Anti-infectives (From admission, onward)   None     Indication(s): None identified  Post-operative Assessment:  Post-procedure Vital Signs:  Pulse/HCG Rate: 8283 Temp: (!) 97.5 F (36.4 C) Resp: (!) 21 BP: 132/76 SpO2: 93 %  EBL: None  Complications: No immediate post-treatment complications observed by team, or reported by patient.  Note: The patient tolerated the entire procedure well. A repeat set of vitals were taken after the procedure and the patient was kept under observation following institutional policy, for this type of procedure. Post-procedural neurological assessment was performed, showing return to  baseline, prior to discharge. The patient was provided with post-procedure discharge instructions, including a section on how to identify potential problems. Should any problems arise concerning this procedure, the patient was given instructions to immediately contact us, at any time, without hesitation. In any case, we plan to contact the patient by telephone for a follow-up status report regarding this interventional procedure.  Comments:  No additional relevant information.  Plan of Care   Imaging Orders     DG C-Arm 1-60 Min-No Report Procedure Orders    No procedure(s) ordered today    Medications ordered for procedure: Meds ordered this encounter  Medications  . lactated ringers infusion 1,000 mL  . fentaNYL (SUBLIMAZE) injection 25-100 mcg    Make sure Narcan is available in the pyxis when using this medication. In the event of respiratory depression (RR< 8/min): Titrate NARCAN (naloxone) in increments of 0.1 to 0.2 mg IV at 2-3 minute intervals, until desired degree of reversal.  . lidocaine (XYLOCAINE) 1 % (with pres) injection 10 mL  . ropivacaine (PF) 2 mg/mL (0.2%) (NAROPIN) injection 10 mL  . dexamethasone (DECADRON) injection 10 mg   Medications administered: We administered lactated ringers, fentaNYL, lidocaine, ropivacaine (PF) 2 mg/mL (0.2%), and dexamethasone.  See the medical record for exact dosing, route, and time of administration.  New Prescriptions   No medications on file   Disposition: Discharge home  Discharge Date & Time: 01/18/2018; 0930 hrs.   Physician-requested Follow-up: Return in about 1 month (around 02/15/2018) for Post Procedure Evaluation.  Future Appointments  Date Time Provider [place]  02/16/2018 10:45 AM Gillis Santa, MD [facility] None  02/22/2018  2:20 PM Steele Sizer, MD [place]  03/14/2018 12:00 PM Gillis Santa, MD [facility] None  05/11/2018  3:00 PM Rubie Maid, MD [facility] None  01/15/2019  2:45 PM [facility] None   Primary Care Physician: Steele Sizer, MD Location: [facility] Note by: Gillis Santa, MD Date: 01/18/2018; Time: 2:47 PM  Disclaimer:  Medicine is not an exact science. The only guarantee in medicine is that nothing is guaranteed. It is important to note that the decision to proceed with this intervention was based on the information collected from the patient. The Data and conclusions were drawn from the patient's questionnaire, the interview, and the physical examination. Because the information was provided in large part by the patient, it cannot be guaranteed that it has not been purposely or unconsciously manipulated. Every effort has been made to obtain as much relevant data as possible for this evaluation. It is important to note that the conclusions that lead to this procedure are derived in large part from the available data. Always take into account that the treatment will also be dependent on availability of resources and existing treatment guidelines, considered by other Pain Management Practitioners as being common knowledge and practice, at the time of the intervention. For Medico-Legal purposes, it is also important to point out that variation in procedural techniques and pharmacological choices are the acceptable norm. The indications, contraindications, technique, and results of the above procedure should only be interpreted and judged by a Board-Certified Interventional Pain Specialist with extensive familiarity and expertise in the same exact procedure and technique.

## 2018-01-19 ENCOUNTER — Telehealth: Payer: Self-pay | Admitting: *Deleted

## 2018-01-19 NOTE — Telephone Encounter (Signed)
Attempted to call for post procedure follow-up. Message left. 

## 2018-02-16 ENCOUNTER — Ambulatory Visit
Payer: Medicaid Other | Attending: Student in an Organized Health Care Education/Training Program | Admitting: Student in an Organized Health Care Education/Training Program

## 2018-02-16 ENCOUNTER — Other Ambulatory Visit: Payer: Self-pay

## 2018-02-16 ENCOUNTER — Encounter: Payer: Self-pay | Admitting: Student in an Organized Health Care Education/Training Program

## 2018-02-16 VITALS — BP 137/80 | HR 97 | Temp 98.2°F | Resp 16 | Ht 64.0 in | Wt 180.0 lb

## 2018-02-16 DIAGNOSIS — J9611 Chronic respiratory failure with hypoxia: Secondary | ICD-10-CM | POA: Diagnosis not present

## 2018-02-16 DIAGNOSIS — Z9981 Dependence on supplemental oxygen: Secondary | ICD-10-CM | POA: Diagnosis not present

## 2018-02-16 DIAGNOSIS — I10 Essential (primary) hypertension: Secondary | ICD-10-CM | POA: Insufficient documentation

## 2018-02-16 DIAGNOSIS — I479 Paroxysmal tachycardia, unspecified: Secondary | ICD-10-CM | POA: Insufficient documentation

## 2018-02-16 DIAGNOSIS — E785 Hyperlipidemia, unspecified: Secondary | ICD-10-CM | POA: Insufficient documentation

## 2018-02-16 DIAGNOSIS — M47812 Spondylosis without myelopathy or radiculopathy, cervical region: Secondary | ICD-10-CM | POA: Insufficient documentation

## 2018-02-16 DIAGNOSIS — G894 Chronic pain syndrome: Secondary | ICD-10-CM | POA: Insufficient documentation

## 2018-02-16 DIAGNOSIS — Z5181 Encounter for therapeutic drug level monitoring: Secondary | ICD-10-CM | POA: Diagnosis present

## 2018-02-16 DIAGNOSIS — F339 Major depressive disorder, recurrent, unspecified: Secondary | ICD-10-CM | POA: Insufficient documentation

## 2018-02-16 DIAGNOSIS — Z79899 Other long term (current) drug therapy: Secondary | ICD-10-CM | POA: Insufficient documentation

## 2018-02-16 DIAGNOSIS — J449 Chronic obstructive pulmonary disease, unspecified: Secondary | ICD-10-CM | POA: Diagnosis not present

## 2018-02-16 DIAGNOSIS — M7918 Myalgia, other site: Secondary | ICD-10-CM | POA: Diagnosis not present

## 2018-02-16 DIAGNOSIS — M542 Cervicalgia: Secondary | ICD-10-CM | POA: Diagnosis not present

## 2018-02-16 DIAGNOSIS — Z87891 Personal history of nicotine dependence: Secondary | ICD-10-CM | POA: Insufficient documentation

## 2018-02-16 DIAGNOSIS — K219 Gastro-esophageal reflux disease without esophagitis: Secondary | ICD-10-CM | POA: Diagnosis not present

## 2018-02-16 DIAGNOSIS — G8929 Other chronic pain: Secondary | ICD-10-CM

## 2018-02-16 MED ORDER — GABAPENTIN 300 MG PO CAPS: 300.0000 mg | ORAL_CAPSULE | Freq: Three times a day (TID) | ORAL | 3 refills | Status: DC

## 2018-02-16 MED ORDER — OXYCODONE HCL 5 MG PO TABS: 5.0000 mg | ORAL_TABLET | Freq: Three times a day (TID) | ORAL | 0 refills | Status: DC | PRN

## 2018-02-16 NOTE — Progress Notes (Signed)
Patient's Name: Judy Allen  MRN: [id number]  Referring Provider: Steele Sizer, MD  DOB: 08-18-1955  PCP: Steele Sizer, MD  DOS: 02/16/2018  Note by: Gillis Santa, MD  Service setting: Ambulatory outpatient  Specialty: Interventional Pain Management  Location: [facility] (AMB) Pain Management Facility    Patient type: Established   Primary Reason(s) for Visit: Encounter for prescription drug management & post-procedure evaluation of chronic illness with mild to moderate exacerbation(Level of risk: moderate) CC: Neck Pain  HPI  Judy Allen is a 62 y.o. year old, female patient, who comes today for a post-procedure evaluation and medication management. She has Tachycardia, paroxysmal ([place]); Benign hypertension; Chronic cervical pain; CN (constipation); Gastric reflux; Hypertriglyceridemia; Edema leg; Chronic recurrent major depressive disorder ([place]); Dysmetabolic syndrome; Obstructive apnea; Psoriasis; Allergic rhinitis; Bursitis, trochanteric; GAD (generalized anxiety disorder); History of fusion of cervical spine; Chronic constipation; Non-thrombocytopenic purpura ([place]); Metastatic squamous cell carcinoma ([place]); Anal squamous cell carcinoma ([place]); Abnormal Pap smear of cervix; Centrilobular emphysema ([place]); Chronic respiratory failure with hypoxia, on home oxygen therapy ([place]); Fatty liver; 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 Her primarily concern today is the Neck Pain  Pain Assessment: Location:   Neck Radiating: down left arm, tingles Onset: More than a month ago Duration: Chronic pain Quality: Constant, Sharp, Burning, Tingling Severity: 4 /10 (subjective, self-reported pain score)  Note: Reported level is compatible with observation.                         When using our objective Pain Scale, levels between 6 and 10/10 are said to belong in an emergency room, as it progressively worsens from a 6/10, described as  severely limiting, requiring emergency care not usually available at an outpatient pain management facility. At a 6/10 level, communication becomes difficult and requires great effort. Assistance to reach the emergency department may be required. Facial flushing and profuse sweating along with potentially dangerous increases in heart rate and blood pressure will be evident. Effect on ADL: increased difficulty with ADLs as of this week Timing: Constant Modifying factors: medications, procedures BP: 137/80  HR: 97  Judy Allen was last seen on 01/19/2018 for a procedure. During today's appointment we reviewed Judy Allen's post-procedure results, as well as her outpatient medication regimen.  Further details on both, my assessment(s), as well as the proposed treatment plan, please see below.  Controlled Substance Pharmacotherapy Assessment REMS (Risk Evaluation and Mitigation Strategy)  Analgesic: Hydrocodone 7.5 mg TID prn, quantity 90 MME/day: 22.5 mg/day.  Rise Patience, RN  02/16/2018 11:30 AM  Sign at close encounter Nursing Pain Medication Assessment:  Safety precautions to be maintained throughout the outpatient stay will include: orient to surroundings, keep bed in low position, maintain call Allen within reach at all times, provide assistance with transfer out of bed and ambulation.  Medication Inspection Compliance: Pill count conducted under aseptic conditions, in front of the patient. Neither the pills nor the bottle was removed from the patient's sight at any time. Once count was completed pills were immediately returned to the patient in their original bottle.  Medication: Hydrocodone/APAP Pill/Patch Count: 86 of 90 pills remain Pill/Patch Appearance: Markings consistent with prescribed medication Bottle Appearance: Standard pharmacy container. Clearly labeled. Filled Date: 7 / 8 / 2019 Last Medication intake:  Today    86 Hydrocodone/APAP pills flushed down toilet  at 11:30 am.  Witnessed by pt, and Charna Busman. Pharmacokinetics: Liberation and absorption (  onset of action): WNL Distribution (time to peak effect): WNL Metabolism and excretion (duration of action): WNL         Pharmacodynamics: Desired effects: Analgesia: Judy Allen reports >50% benefit. Functional ability: Patient reports that medication allows her to accomplish basic ADLs Clinically meaningful improvement in function (CMIF): Sustained CMIF goals met Perceived effectiveness: Described as relatively effective, allowing for increase in activities of daily living (ADL) Undesirable effects: Side-effects or Adverse reactions: None reported Monitoring: [place] PMP: Online review of the past 1-monthperiod conducted. Compliant with practice rules and regulations Last UDS on record: Summary  Date Value Ref Range Status  09/07/2017 FINAL  Final    Comment:    ==================================================================== TOXASSURE COMP DRUG ANALYSIS,UR ==================================================================== Test                             Result       Flag       Units Drug Present and Declared for Prescription Verification   Norhydrocodone                 189          EXPECTED   ng/mg creat    Norhydrocodone is an expected metabolite of hydrocodone.   Gabapentin                     PRESENT      EXPECTED   Venlafaxine                    PRESENT      EXPECTED   Desmethylvenlafaxine           PRESENT      EXPECTED    Desmethylvenlafaxine is an expected metabolite of venlafaxine.   Acetaminophen                  PRESENT      EXPECTED   Metoprolol                     PRESENT      EXPECTED Drug Absent but Declared for Prescription Verification   Hydrocodone                    Not Detected UNEXPECTED ng/mg creat    Hydrocodone is almost always present in patients taking this drug    consistently. Absence of hydrocodone could be due to lapse of    time since the last dose or unusual  pharmacokinetics (rapid    metabolism).   Tizanidine                     Not Detected UNEXPECTED    Tizanidine, as indicated in the declared medication list, is not    always detected even when used as directed. ==================================================================== Test                      Result    Flag   Units      Ref Range   Creatinine              35               mg/dL      >=20 ==================================================================== Declared Medications:  The flagging and interpretation on this report are based on the  following declared medications.  Unexpected results may arise from  inaccuracies in the declared medications.  **  Note: The testing scope of this panel includes these medications:  Gabapentin  Hydrocodone (Hydrocodone-Acetaminophen)  Metoprolol  Venlafaxine  **Note: The testing scope of this panel does not include small to  moderate amounts of these reported medications:  Acetaminophen (Hydrocodone-Acetaminophen)  Tizanidine (Zanaflex)  **Note: The testing scope of this panel does not include following  reported medications:  Albuterol  Betamethasone (Diprolene)  Cyanocobalamin  Divalproex (Depakote)  Hydrochlorothiazide  Hydrocortisone  Ketoconazole (Nizoral)  Omeprazole (Prilosec)  Oxybutynin  Oxygen  Supplement (Omega-3)  Tiotropium (Spiriva)  Tolterodine (Detrol LA) ==================================================================== For clinical consultation, please call [phone]. ====================================================================    UDS interpretation: Compliant          Medication Assessment Form: Reviewed. Patient indicates being compliant with therapy Treatment compliance: Compliant Risk Assessment Profile: Aberrant behavior: See prior evaluations. None observed or detected today Comorbid factors increasing risk of overdose: See prior notes. No additional risks detected today Risk of  substance use disorder (SUD): Low Opioid Risk Tool - 02/16/18 1055      Family History of Substance Abuse   Alcohol  Negative    Illegal Drugs  Negative    Rx Drugs  Negative      Personal History of Substance Abuse   Alcohol  Negative    Illegal Drugs  Negative    Rx Drugs  Negative      Age   Age between 65-45 years   No      Psychological Disease   Psychological Disease  Negative    Depression  Positive      Total Score   Opioid Risk Tool Scoring  1    Opioid Risk Interpretation  Low Risk      ORT Scoring interpretation table:  Score <3 = Low Risk for SUD  Score between 4-7 = Moderate Risk for SUD  Score >8 = High Risk for Opioid Abuse   Risk Mitigation Strategies:  Patient Counseling: Covered Patient-Prescriber Agreement (PPA): Present and active  Notification to other healthcare providers: Done  Pharmacologic Plan: Patient informed that she does have fatty liver disease as a result we will transition her hydrocodone to oxycodone to eliminate acetaminophen from her chronic analgesic regimen.             Post-Procedure Assessment  01/18/2018 Procedure: Left C4, 5, 6, 7 cervical facet medial branch nerve block Pre-procedure pain score:  6/10 Post-procedure pain score: 0/10         Influential Factors: BMI: 30.90 kg/m Intra-procedural challenges: None observed.         Assessment challenges: None detected.              Reported side-effects: None.        Post-procedural adverse reactions or complications: None reported         Sedation: Please see nurses note. When no sedatives are used, the analgesic levels obtained are directly associated to the effectiveness of the local anesthetics. However, when sedation is provided, the level of analgesia obtained during the initial 1 hour following the intervention, is believed to be the result of a combination of factors. These factors may include, but are not limited to: 1. The effectiveness of the local anesthetics  used. 2. The effects of the analgesic(s) and/or anxiolytic(s) used. 3. The degree of discomfort experienced by the patient at the time of the procedure. 4. The patients ability and reliability in recalling and recording the events. 5. The presence and influence of possible secondary gains and/or psychosocial factors. Reported result:  Relief experienced during the 1st hour after the procedure: 100 % (Ultra-Short Term Relief)            Interpretative annotation: Clinically appropriate result. Analgesia during this period is likely to be Local Anesthetic and/or IV Sedative (Analgesic/Anxiolytic) related.          Effects of local anesthetic: The analgesic effects attained during this period are directly associated to the localized infiltration of local anesthetics and therefore cary significant diagnostic value as to the etiological location, or anatomical origin, of the pain. Expected duration of relief is directly dependent on the pharmacodynamics of the local anesthetic used. Long-acting (4-6 hours) anesthetics used.  Reported result: Relief during the next 4 to 6 hour after the procedure: 100 % (Short-Term Relief)            Interpretative annotation: Clinically appropriate result. Analgesia during this period is likely to be Local Anesthetic-related.          Long-term benefit: Defined as the period of time past the expected duration of local anesthetics (1 hour for short-acting and 4-6 hours for long-acting). With the possible exception of prolonged sympathetic blockade from the local anesthetics, benefits during this period are typically attributed to, or associated with, other factors such as analgesic sensory neuropraxia, antiinflammatory effects, or beneficial biochemical changes provided by agents other than the local anesthetics.  Reported result: Extended relief following procedure: 100 %(100% relief lasted for 3 weeks) (Long-Term Relief)            Interpretative annotation: Clinically  appropriate result. Good relief. No permanent benefit expected. Inflammation plays a part in the etiology to the pain.          Current benefits: Defined as reported results that persistent at this point in time.   Analgesia: 50-75 %            Function: Judy Allen reports improvement in function ROM: Judy Allen reports improvement in ROM Interpretative annotation: Ongoing benefit. Therapeutic benefit observed. Effective therapeutic approach.          Interpretation: Results would suggest a successful diagnostic intervention.                  Plan:  Please see "Plan of Care" for details.                Laboratory Chemistry  Inflammation Markers (CRP: Acute Phase) (ESR: Chronic Phase) No results found for: CRP, ESRSEDRATE, LATICACIDVEN                       Rheumatology Markers No results found for: RF, ANA, LABURIC, URICUR, LYMEIGGIGMAB, LYMEABIGMQN, HLAB27                      Renal Function Markers Lab Results  Component Value Date   BUN 10 12/06/2017   CREATININE 0.90 96/28/3662   BCR NOT APPLICABLE 94/76/5465   GFRAA 79 12/06/2017   GFRNONAA 69 12/06/2017                             Hepatic Function Markers Lab Results  Component Value Date   AST 39 (H) 12/06/2017   ALT 35 (H) 12/06/2017   ALBUMIN 4.3 03/03/2017   ALKPHOS 53 03/03/2017   LIPASE 27 08/03/2017                        Electrolytes Lab  Results  Component Value Date   NA 140 12/06/2017   K 3.9 12/06/2017   CL 100 12/06/2017   CALCIUM 9.8 12/06/2017                        Neuropathy Markers Lab Results  Component Value Date   HGBA1C 6.1 (H) 08/29/2017                        Bone Pathology Markers No results found for: VD25OH, [id number], OV5643PI9, [id number], 25OHVITD1, 25OHVITD2, 25OHVITD3, TESTOFREE, TESTOSTERONE                       Coagulation Parameters Lab Results  Component Value Date   INR 1.00 09/12/2017   LABPROT 13.1 09/12/2017   APTT 30 09/12/2017   PLT 262 08/03/2017                         Cardiovascular Markers Lab Results  Component Value Date   CKTOTAL 128 06/08/2012   CKMB 2.2 06/08/2012   TROPONINI < 0.02 06/08/2012   HGB 12.8 08/03/2017   HCT 37.0 08/03/2017                         CA Markers No results found for: CEA, CA125, LABCA2                      Note: Lab results reviewed.  Recent Diagnostic Imaging Results  DG C-Arm 1-60 Min-No Report Fluoroscopy was utilized by the requesting physician.  No radiographic  interpretation.   Complexity Note: Imaging results reviewed. Results shared with Judy Allen, using Layman's terms.                         Meds   Current Outpatient Medications:  .  albuterol (PROVENTIL HFA;VENTOLIN HFA) 108 (90 Base) MCG/ACT inhaler, Inhale 2 puffs into the lungs every 6 (six) hours as needed for wheezing or shortness of breath., Disp: , Rfl:  .  betamethasone dipropionate (DIPROLENE) 0.05 % cream, Apply 1 application topically 2 (two) times daily as needed., Disp: , Rfl:  .  budesonide-formoterol (SYMBICORT) 160-4.5 MCG/ACT inhaler, Inhale 2 puffs into the lungs 2 (two) times daily., Disp: , Rfl:  .  divalproex (DEPAKOTE) 250 MG DR tablet, Take 1 tablet (250 mg total) by mouth at bedtime., Disp: 30 tablet, Rfl: 5 .  fluticasone (FLONASE) 50 MCG/ACT nasal spray, Place 2 sprays into the nose daily., Disp: , Rfl:  .  gabapentin (NEURONTIN) 300 MG capsule, Take 1 capsule (300 mg total) by mouth 3 (three) times daily., Disp: 90 capsule, Rfl: 3 .  hydrochlorothiazide (HYDRODIURIL) 25 MG tablet, Take 1 tablet (25 mg total) by mouth daily., Disp: 30 tablet, Rfl: 5 .  hydrocortisone 2.5 % cream, Apply 1 application topically 2 (two) times daily., Disp: , Rfl:  .  hydrOXYzine (ATARAX/VISTARIL) 25 MG tablet, TK 1 T PO QHS, Disp: , Rfl: 1 .  ketoconazole (NIZORAL) 2 % cream, Apply 1 application topically 2 (two) times daily., Disp: , Rfl:  .  loratadine (CLARITIN) 10 MG tablet, Take 1 tablet (10 mg total) by mouth daily., Disp:  30 tablet, Rfl: 5 .  metoprolol succinate (TOPROL-XL) 50 MG 24 hr tablet, Take 1 tablet (50 mg total) by mouth daily., Disp: 30 tablet, Rfl: 5 .  mirabegron ER (MYRBETRIQ) 50 MG TB24 tablet, Take 1 tablet (50 mg total) by mouth daily., Disp: 30 tablet, Rfl: 11 .  omega-3 acid ethyl esters (LOVAZA) 1 g capsule, Take 2 capsules (2 g total) 2 (two) times daily by mouth., Disp: 120 capsule, Rfl: 5 .  omeprazole (PRILOSEC) 40 MG capsule, Take 1 capsule (40 mg total) by mouth daily., Disp: 30 capsule, Rfl: 5 .  OXYGEN, Inhale 2 L into the lungs continuous. , Disp: , Rfl:  .  tiZANidine (ZANAFLEX) 4 MG tablet, Take 1 tablet (4 mg total) by mouth every 8 (eight) hours as needed., Disp: 90 tablet, Rfl: 3 .  triamcinolone cream (KENALOG) 0.1 %, APP TOPICALLY AA BID, Disp: , Rfl: 0 .  venlafaxine XR (EFFEXOR-XR) 150 MG 24 hr capsule, Take 1 capsule (150 mg total) by mouth daily with breakfast., Disp: 30 capsule, Rfl: 5 .  oxyCODONE (OXY IR/ROXICODONE) 5 MG immediate release tablet, Take 1 tablet (5 mg total) by mouth 3 (three) times daily as needed for severe pain., Disp: 90 tablet, Rfl: 0 .  tiotropium (SPIRIVA) 18 MCG inhalation capsule, Place 18 mcg into inhaler and inhale daily. , Disp: , Rfl:   ROS  Constitutional: Denies any fever or chills Gastrointestinal: No reported hemesis, hematochezia, vomiting, or acute GI distress Musculoskeletal: Denies any acute onset joint swelling, redness, loss of ROM, or weakness Neurological: No reported episodes of acute onset apraxia, aphasia, dysarthria, agnosia, amnesia, paralysis, loss of coordination, or loss of consciousness  Allergies  Judy Allen is allergic to dapsone; sulfa antibiotics; and sulfasalazine.  PFSH  Drug: Judy Allen  reports that she does not use drugs. Alcohol:  reports that she does not drink alcohol. Tobacco:  reports that she quit smoking about 12 years ago. Her smoking use included cigarettes. She started smoking about 54 years ago. She  has a 32.00 pack-year smoking history. She has never used smokeless tobacco. Medical:  has a past medical history of Anemia, Anxiety, Arthritis, Colon cancer ([place]) (8341), Complication of anesthesia, Constipation, COPD (chronic obstructive pulmonary disease) ([place]), Depression, Dysrhythmia, GERD (gastroesophageal reflux disease), H/O allergic rhinitis, Heart murmur, Hemorrhoid, Hyperlipidemia, Hypertension, Neck pain, chronic, Neuritis, Ovarian failure, Personal history of chemotherapy, Personal history of radiation therapy, PONV (postoperative nausea and vomiting), Pre-diabetes (2019), Proteinuria, Psoriasis, Sleep apnea (09/12/2016), Tachycardia, paroxysmal ([place]), and Urinary incontinence. Surgical: Judy Allen  has a past surgical history that includes Tubal ligation; tonsillectomy (1959); herniated disc repair (2001); Anterior fusion lumbar spine (1990); Neck surgery; Mass excision (Left, 02/23/2016); Colonoscopy (2018); Portacath placement (Left, 03/12/2016); Cardiac catheterization (2009? ); Cardiac catheterization (05/2012); Port-a-cath removal (2018); Liver biopsy (N/A, 09/12/2017); and Cholecystectomy (N/A, 09/12/2017). Family: family history includes Asthma in her mother; Breast cancer in her maternal aunt; Heart attack in her mother; Liver cancer in her father.  Constitutional Exam  General appearance: Well nourished, well developed, and well hydrated. In no apparent acute distress Vitals:   02/16/18 1044  BP: 137/80  Pulse: 97  Resp: 16  Temp: 98.2 F (36.8 C)  TempSrc: Oral  SpO2: 96%  Weight: 180 lb (81.6 kg)  Height: '5\' 4"'  (1.626 m)   BMI Assessment: Estimated body mass index is 30.9 kg/m as calculated from the following:   Height as of this encounter: '5\' 4"'  (1.626 m).   Weight as of this encounter: 180 lb (81.6 kg).  BMI interpretation table: BMI level Category Range association with higher incidence of chronic pain  <18 kg/m2 Underweight   18.5-24.9 kg/m2 Ideal body weight  25-29.9 kg/m2 Overweight Increased incidence by 20%  30-34.9 kg/m2 Obese (Class I) Increased incidence by 68%  35-39.9 kg/m2 Severe obesity (Class II) Increased incidence by 136%  >40 kg/m2 Extreme obesity (Class III) Increased incidence by 254%   Patient's current BMI Ideal Body weight  Body mass index is 30.9 kg/m. Ideal body weight: 54.7 kg (120 lb 9.5 oz) Adjusted ideal body weight: 65.5 kg (144 lb 5.7 oz)   BMI Readings from Last 4 Encounters:  02/16/18 30.90 kg/m  01/18/18 31.24 kg/m  01/09/18 31.45 kg/m  12/15/17 30.90 kg/m   Wt Readings from Last 4 Encounters:  02/16/18 180 lb (81.6 kg)  01/18/18 182 lb (82.6 kg)  01/09/18 183 lb 3.2 oz (83.1 kg)  12/15/17 180 lb (81.6 kg)  Psych/Mental status: Alert, oriented x 3 (person, place, & time)       Eyes: PERLA Respiratory: No evidence of acute respiratory distress   Cervical Spine Area Exam  Skin & Axial Inspection:No masses, redness, edema, swelling, or associated skin lesions Alignment:Symmetrical Functional ZOX:[id number] ROM, bilaterallyL>R Stability:No instability detected Muscle Tone/Strength:Functionally intact. No obvious neuro-muscular anomalies detected. Sensory (Neurological):Articular pain pattern Palpation:Complains of area being tender to palpationPositive provocative maneuver forfor cervical facet , +trigger points   Upper Extremity (UE) Exam    Side: Right upper extremity  Side: Left upper extremity  Skin & Extremity Inspection: Skin color, temperature, and hair growth are WNL. No peripheral edema or cyanosis. No masses, redness, swelling, asymmetry, or associated skin lesions. No contractures.  Skin & Extremity Inspection: Skin color, temperature, and hair growth are WNL. No peripheral edema or cyanosis. No masses, redness, swelling, asymmetry, or associated skin lesions. No contractures.  Functional ROM: Unrestricted ROM          Functional ROM: Unrestricted ROM          Muscle Tone/Strength:  Functionally intact. No obvious neuro-muscular anomalies detected.  Muscle Tone/Strength: Functionally intact. No obvious neuro-muscular anomalies detected.  Sensory (Neurological): Unimpaired          Sensory (Neurological): Unimpaired          Palpation: No palpable anomalies              Palpation: No palpable anomalies              Provocative Test(s):  Phalen's test: deferred Tinel's test: deferred Apley's scratch test (touch opposite shoulder):  Action 1 (Across chest): deferred Action 2 (Overhead): deferred Action 3 (LB reach): deferred   Provocative Test(s):  Phalen's test: deferred Tinel's test: deferred Apley's scratch test (touch opposite shoulder):  Action 1 (Across chest): deferred Action 2 (Overhead): deferred Action 3 (LB reach): deferred    Thoracic Spine Area Exam  Skin & Axial Inspection: No masses, redness, or swelling Alignment: Symmetrical Functional ROM: Unrestricted ROM Stability: No instability detected Muscle Tone/Strength: Functionally intact. No obvious neuro-muscular anomalies detected. Sensory (Neurological): Unimpaired Muscle strength & Tone: No palpable anomalies  Lumbar Spine Area Exam  Skin & Axial Inspection: No masses, redness, or swelling Alignment: Symmetrical Functional ROM: Unrestricted ROM       Stability: No instability detected Muscle Tone/Strength: Functionally intact. No obvious neuro-muscular anomalies detected. Sensory (Neurological): Unimpaired Palpation: No palpable anomalies       Provocative Tests: Lumbar Hyperextension/rotation test: deferred today       Lumbar quadrant test (Kemp's test): deferred today       Lumbar Lateral bending test: deferred today       Patrick's Maneuver: deferred today  FABER test: deferred today                   Thigh-thrust test: deferred today       S-I compression test: deferred today       S-I distraction test: deferred today        Gait & Posture Assessment  Ambulation:  Unassisted Gait: Relatively normal for age and body habitus Posture: WNL   Lower Extremity Exam    Side: Right lower extremity  Side: Left lower extremity  Stability: No instability observed          Stability: No instability observed          Skin & Extremity Inspection: Skin color, temperature, and hair growth are WNL. No peripheral edema or cyanosis. No masses, redness, swelling, asymmetry, or associated skin lesions. No contractures.  Skin & Extremity Inspection: Skin color, temperature, and hair growth are WNL. No peripheral edema or cyanosis. No masses, redness, swelling, asymmetry, or associated skin lesions. No contractures.  Functional ROM: Unrestricted ROM                  Functional ROM: Unrestricted ROM                  Muscle Tone/Strength: Functionally intact. No obvious neuro-muscular anomalies detected.  Muscle Tone/Strength: Functionally intact. No obvious neuro-muscular anomalies detected.  Sensory (Neurological): Unimpaired  Sensory (Neurological): Unimpaired  Palpation: No palpable anomalies  Palpation: No palpable anomalies   Assessment  Primary Diagnosis & Pertinent Problem List: The primary encounter diagnosis was Myofascial pain syndrome, cervical. Diagnoses of Chronic cervical pain, Chronic pain syndrome, Cervical facet joint syndrome, and Spondylosis of cervical region without myelopathy or radiculopathy were also pertinent to this visit.  Status Diagnosis  Having a Flare-up Persistent Persistent 1. Myofascial pain syndrome, cervical   2. Chronic cervical pain   3. Chronic pain syndrome   4. Cervical facet joint syndrome   5. Spondylosis of cervical region without myelopathy or radiculopathy     General Recommendations: The pain condition that the patient suffers from is best treated with a multidisciplinary approach that involves an increase in physical activity to prevent de-conditioning and worsening of the pain cycle, as well as psychological counseling  (formal and/or informal) to address the co-morbid psychological affects of pain. Treatment will often involve judicious use of pain medications and interventional procedures to decrease the pain, allowing the patient to participate in the physical activity that will ultimately produce long-lasting pain reductions. The goal of the multidisciplinary approach is to return the patient to a higher level of overall function and to restore their ability to perform activities of daily living.  62 year old female with a history of cervical spondylosis who follows up status post left C4, 5, 6, 7 cervical facet medial branch nerve block.  Patient is still endorsing approximately 60 to 70% neck and shoulder pain as a result of her previous cervical facet nerve blocks.  She is having more prominent myofascial pain with trigger points noted on physical exam.  We discussed performing left and right-sided cervical paraspinal and trapezius trigger point injections for myofascial pain syndrome.  Risks and benefits were discussed.  Patient would like to proceed.  Refill gabapentin as below.  Patient informed me that she does have fatty liver disease as a result we will transition her from hydrocodone to oxycodone to avoid acetaminophen and her daily chronic pain regimen.  Patient instructed to continue tizanidine and Effexor as prescribed.  Plan  of Care  Pharmacotherapy (Medications Ordered): Meds ordered this encounter  Medications  . gabapentin (NEURONTIN) 300 MG capsule    Sig: Take 1 capsule (300 mg total) by mouth 3 (three) times daily.    Dispense:  90 capsule    Refill:  3  . DISCONTD: oxyCODONE (OXY IR/ROXICODONE) 5 MG immediate release tablet    Sig: Take 1 tablet (5 mg total) by mouth 3 (three) times daily as needed for severe pain.    Dispense:  90 tablet    Refill:  0    Do not place this medication, or any other prescription from our practice, on "Automatic Refill". Patient may have prescription filled one  day early if pharmacy is closed on scheduled refill date.  For chronic pain To fill on or after: 02/16/18, 03/18/18, 04/17/18  . DISCONTD: oxyCODONE (OXY IR/ROXICODONE) 5 MG immediate release tablet    Sig: Take 1 tablet (5 mg total) by mouth 3 (three) times daily as needed for severe pain.    Dispense:  90 tablet    Refill:  0    Do not place this medication, or any other prescription from our practice, on "Automatic Refill". Patient may have prescription filled one day early if pharmacy is closed on scheduled refill date.  For chronic pain To fill on or after: 02/16/18, 03/18/18, 04/17/18  . oxyCODONE (OXY IR/ROXICODONE) 5 MG immediate release tablet    Sig: Take 1 tablet (5 mg total) by mouth 3 (three) times daily as needed for severe pain.    Dispense:  90 tablet    Refill:  0    Do not place this medication, or any other prescription from our practice, on "Automatic Refill". Patient may have prescription filled one day early if pharmacy is closed on scheduled refill date.  For chronic pain To fill on or after: 02/16/18, 03/18/18, 04/17/18   Lab-work, procedure(s), and/or referral(s): Orders Placed This Encounter  Procedures  . TRIGGER POINT INJECTION    Time Note: Greater than 50% of the 25 minute(s) of face-to-face time spent with Judy Allen, was spent in counseling/coordination of care regarding: Judy Allen primary cause of pain, the treatment plan, medication side effects, the opioid analgesic risks and possible complications, the appropriate use of her medications, the goals of pain management (increased in functionality), the medication agreement and the patient's responsibilities when it comes to controlled substances.   Provider-requested follow-up: Return in about 4 days (around 02/20/2018) for Procedure.  Future Appointments  Date Time Provider [place]  02/20/2018 10:45 AM Gillis Santa, MD [facility] None  02/22/2018  2:20 PM Steele Sizer, MD [facility]  03/01/2018   3:45 PM Marja Kays, RD [facility] None  05/11/2018  3:00 PM Rubie Maid, MD [facility] None  05/18/2018 12:00 PM Gillis Santa, MD [facility] None  01/15/2019  2:45 PM [facility] None    Primary Care Physician: Steele Sizer, MD Location: [facility] Note by: Gillis Santa, M.D Date: 02/16/2018; Time: 2:58 PM  Patient Instructions  You have been given 3 Rx for Oxycodone to last until 05/17/2018.  Gabapentin has been escribed to your pharmacy.   Trigger Point Injection Trigger points are areas where you have pain. A trigger point injection is a shot given in the trigger point to help relieve pain for a few days to a few months. Common places for trigger points include:  The neck.  The shoulders.  The upper back.  The lower back.  A  trigger point injection will not cure long-lasting (chronic) pain permanently. These injections do not always work for every person, but for some people they can help to relieve pain for a few days to a few months. Tell a health care provider about:  Any allergies you have.  All medicines you are taking, including vitamins, herbs, eye drops, creams, and over-the-counter medicines.  Any problems you or family members have had with anesthetic medicines.  Any blood disorders you have.  Any surgeries you have had.  Any medical conditions you have. What are the risks? Generally, this is a safe procedure. However, problems may occur, including:  Infection.  Bleeding.  Allergic reaction to the injected medicine.  Irritation of the skin around the injection site.  What happens before the procedure?  Ask your health care provider about changing or stopping your regular medicines. This is especially important if you are taking diabetes medicines or blood thinners. What happens during the procedure?  Your health care provider will feel for trigger points. A marker may be used to circle  the area for the injection.  The skin over the trigger point will be washed with a germ-killing (antiseptic) solution.  A thin needle is used for the shot. You may feel pain or a twitching feeling when the needle enters the trigger point.  A numbing solution may be injected into the trigger point. Sometimes a medicine to keep down swelling, redness, and warmth (inflammation) is also injected.  Your health care provider may move the needle around the area where the trigger point is located until the tightness and twitching goes away.  After the injection, your health care provider may put gentle pressure over the injection site.  The injection site will be covered with a bandage (dressing). The procedure may vary among health care providers and hospitals. What happens after the procedure?  The dressing can be taken off in a few hours or as told by your health care provider.  You may feel sore and stiff for 1-2 days. This information is not intended to replace advice given to you by your health care provider. Make sure you discuss any questions you have with your health care provider. Document Released: 07/15/2011 Document Revised: 03/28/2016 Document Reviewed: 01/13/2015 Elsevier Interactive Patient Education  2018 [employer].

## 2018-02-16 NOTE — Progress Notes (Signed)
Nursing Pain Medication Assessment:  Safety precautions to be maintained throughout the outpatient stay will include: orient to surroundings, keep bed in low position, maintain call bell within reach at all times, provide assistance with transfer out of bed and ambulation.  Medication Inspection Compliance: Pill count conducted under aseptic conditions, in front of the patient. Neither the pills nor the bottle was removed from the patient's sight at any time. Once count was completed pills were immediately returned to the patient in their original bottle.  Medication: Hydrocodone/APAP Pill/Patch Count: 86 of 90 pills remain Pill/Patch Appearance: Markings consistent with prescribed medication Bottle Appearance: Standard pharmacy container. Clearly labeled. Filled Date: 7 / 8 / 2019 Last Medication intake:  Today    86 Hydrocodone/APAP pills flushed down toilet  at 11:30 am. Witnessed by pt, and Charna Busman.

## 2018-02-16 NOTE — Patient Instructions (Addendum)
You have been given 3 Rx for Oxycodone to last until 05/17/2018.  Gabapentin has been escribed to your pharmacy.   Trigger Point Injection Trigger points are areas where you have pain. A trigger point injection is a shot given in the trigger point to help relieve pain for a few days to a few months. Common places for trigger points include:  The neck.  The shoulders.  The upper back.  The lower back.  A trigger point injection will not cure long-lasting (chronic) pain permanently. These injections do not always work for every person, but for some people they can help to relieve pain for a few days to a few months. Tell a health care provider about:  Any allergies you have.  All medicines you are taking, including vitamins, herbs, eye drops, creams, and over-the-counter medicines.  Any problems you or family members have had with anesthetic medicines.  Any blood disorders you have.  Any surgeries you have had.  Any medical conditions you have. What are the risks? Generally, this is a safe procedure. However, problems may occur, including:  Infection.  Bleeding.  Allergic reaction to the injected medicine.  Irritation of the skin around the injection site.  What happens before the procedure?  Ask your health care provider about changing or stopping your regular medicines. This is especially important if you are taking diabetes medicines or blood thinners. What happens during the procedure?  Your health care provider will feel for trigger points. A marker may be used to circle the area for the injection.  The skin over the trigger point will be washed with a germ-killing (antiseptic) solution.  A thin needle is used for the shot. You may feel pain or a twitching feeling when the needle enters the trigger point.  A numbing solution may be injected into the trigger point. Sometimes a medicine to keep down swelling, redness, and warmth (inflammation) is also  injected.  Your health care provider may move the needle around the area where the trigger point is located until the tightness and twitching goes away.  After the injection, your health care provider may put gentle pressure over the injection site.  The injection site will be covered with a bandage (dressing). The procedure may vary among health care providers and hospitals. What happens after the procedure?  The dressing can be taken off in a few hours or as told by your health care provider.  You may feel sore and stiff for 1-2 days. This information is not intended to replace advice given to you by your health care provider. Make sure you discuss any questions you have with your health care provider. Document Released: 07/15/2011 Document Revised: 03/28/2016 Document Reviewed: 01/13/2015 Elsevier Interactive Patient Education  2018 [employer].

## 2018-02-20 ENCOUNTER — Ambulatory Visit
Payer: Medicaid Other | Attending: Student in an Organized Health Care Education/Training Program | Admitting: Student in an Organized Health Care Education/Training Program

## 2018-02-20 ENCOUNTER — Encounter: Payer: Self-pay | Admitting: Student in an Organized Health Care Education/Training Program

## 2018-02-20 ENCOUNTER — Other Ambulatory Visit: Payer: Self-pay

## 2018-02-20 VITALS — BP 139/82 | HR 95 | Temp 97.7°F | Resp 16 | Ht 64.0 in | Wt 180.0 lb

## 2018-02-20 DIAGNOSIS — Z9851 Tubal ligation status: Secondary | ICD-10-CM | POA: Diagnosis not present

## 2018-02-20 DIAGNOSIS — G8929 Other chronic pain: Secondary | ICD-10-CM

## 2018-02-20 DIAGNOSIS — M7918 Myalgia, other site: Secondary | ICD-10-CM | POA: Insufficient documentation

## 2018-02-20 DIAGNOSIS — Z9889 Other specified postprocedural states: Secondary | ICD-10-CM | POA: Insufficient documentation

## 2018-02-20 DIAGNOSIS — Z9049 Acquired absence of other specified parts of digestive tract: Secondary | ICD-10-CM | POA: Diagnosis not present

## 2018-02-20 DIAGNOSIS — G894 Chronic pain syndrome: Secondary | ICD-10-CM | POA: Insufficient documentation

## 2018-02-20 DIAGNOSIS — M542 Cervicalgia: Secondary | ICD-10-CM | POA: Diagnosis not present

## 2018-02-20 MED ORDER — HYDROCODONE-ACETAMINOPHEN 7.5-325 MG PO TABS: 1.0000 | ORAL_TABLET | Freq: Three times a day (TID) | ORAL | 0 refills | Status: DC | PRN

## 2018-02-20 MED ORDER — ROPIVACAINE HCL 2 MG/ML IJ SOLN
INTRAMUSCULAR | Status: AC
  Filled 2018-02-20: qty 10

## 2018-02-20 MED ORDER — ROPIVACAINE HCL 2 MG/ML IJ SOLN
10.0000 mL | Freq: Once | INTRAMUSCULAR | Status: AC
  Administered 2018-02-20: 10 mL

## 2018-02-20 NOTE — Patient Instructions (Signed)
You have been given 3 Rx for Hydrocodone to last until 05/21/2018.

## 2018-02-20 NOTE — Progress Notes (Signed)
Patient's Name: Judy Allen  MRN: [id number]  Referring Provider: Steele Sizer, MD  DOB: March 11, 1956  PCP: Steele Sizer, MD  DOS: 02/20/2018  Note by: Gillis Santa, MD  Service setting: Ambulatory outpatient  Specialty: Interventional Pain Management  Patient type: Established  Location: [facility] (AMB) Pain Management Facility  Visit type: Interventional Procedure   Primary Reason for Visit: Interventional Pain Management Treatment. CC: Procedure (TPI )  Procedure:            Type: Trigger Point Injection (1-2 muscle groups) CPT: 20552 Primary Purpose: Diagnostic Region: Upper Back:  Level: Cervico-thoracic Target Area: Trigger Point Approach: Percutaneous, ipsilateral approach. Laterality: Midline       Position: Sitting     Indications: 1. Myofascial pain syndrome, cervical   2. Chronic cervical pain   3. Chronic pain syndrome    Pain Score: Pre-procedure: 8 /10 Post-procedure: 5 /10  Pre-op Assessment:  Judy Allen is a 62 y.o. (year old), female patient, seen today for interventional treatment. She  has a past surgical history that includes Tubal ligation; tonsillectomy (2094); herniated disc repair (2001); Anterior fusion lumbar spine (1990); Neck surgery; Mass excision (Left, 02/23/2016); Colonoscopy (2018); Portacath placement (Left, 03/12/2016); Cardiac catheterization (2009? ); Cardiac catheterization (05/2012); Port-a-cath removal (2018); Liver biopsy (N/A, 09/12/2017); and Cholecystectomy (N/A, 09/12/2017). Judy Allen has a current medication list which includes the following prescription(s): albuterol, betamethasone dipropionate, budesonide-formoterol, divalproex, fluticasone, gabapentin, hydrochlorothiazide, hydrocortisone, hydroxyzine, ketoconazole, loratadine, metoprolol succinate, mirabegron er, omega-3 acid ethyl esters, omeprazole, oxycodone, oxygen-helium, tizanidine, triamcinolone cream, venlafaxine xr, hydrocodone-acetaminophen, and tiotropium. Her primarily concern today  is the Procedure (TPI )  Initial Vital Signs:  Pulse/HCG Rate: 99  Temp: 98.2 F (36.8 C) Resp: 16 BP: 133/88 SpO2: 94 %(2L of O2)  BMI: Estimated body mass index is 30.9 kg/m as calculated from the following:   Height as of this encounter: 5\' 4"  (1.626 m).   Weight as of this encounter: 180 lb (81.6 kg).  Risk Assessment: Allergies: Reviewed. She is allergic to dapsone; sulfa antibiotics; and sulfasalazine.  Allergy Precautions: None required Coagulopathies: Reviewed. None identified.  Blood-thinner therapy: None at this time Active Infection(s): Reviewed. None identified. Judy Allen is afebrile  Site Confirmation: Judy Allen was asked to confirm the procedure and laterality before marking the site Procedure checklist: Completed Consent: Before the procedure and under the influence of no sedative(s), amnesic(s), or anxiolytics, the patient was informed of the treatment options, risks and possible complications. To fulfill our ethical and legal obligations, as recommended by the American Medical Association's Code of Ethics, I have informed the patient of my clinical impression; the nature and purpose of the treatment or procedure; the risks, benefits, and possible complications of the intervention; the alternatives, including doing nothing; the risk(s) and benefit(s) of the alternative treatment(s) or procedure(s); and the risk(s) and benefit(s) of doing nothing. The patient was provided information about the general risks and possible complications associated with the procedure. These may include, but are not limited to: failure to achieve desired goals, infection, bleeding, organ or nerve damage, allergic reactions, paralysis, and death. In addition, the patient was informed of those risks and complications associated to the procedure, such as failure to decrease pain; infection; bleeding; organ or nerve damage with subsequent damage to sensory, motor, and/or autonomic systems, resulting  in permanent pain, numbness, and/or weakness of one or several areas of the body; allergic reactions; (i.e.: anaphylactic reaction); and/or death. Furthermore, the patient was informed of those risks and complications associated with the  medications. These include, but are not limited to: allergic reactions (i.e.: anaphylactic or anaphylactoid reaction(s)); adrenal axis suppression; blood sugar elevation that in diabetics may result in ketoacidosis or comma; water retention that in patients with history of congestive heart failure may result in shortness of breath, pulmonary edema, and decompensation with resultant heart failure; weight gain; swelling or edema; medication-induced neural toxicity; particulate matter embolism and blood vessel occlusion with resultant organ, and/or nervous system infarction; and/or aseptic necrosis of one or more joints. Finally, the patient was informed that Medicine is not an exact science; therefore, there is also the possibility of unforeseen or unpredictable risks and/or possible complications that may result in a catastrophic outcome. The patient indicated having understood very clearly. We have given the patient no guarantees and we have made no promises. Enough time was given to the patient to ask questions, all of which were answered to the patient's satisfaction. Judy Allen has indicated that she wanted to continue with the procedure. Attestation: I, the ordering provider, attest that I have discussed with the patient the benefits, risks, side-effects, alternatives, likelihood of achieving goals, and potential problems during recovery for the procedure that I have provided informed consent. Date  Time: 02/20/2018 10:30 AM  Pre-Procedure Preparation:  Monitoring: As per clinic protocol. Respiration, ETCO2, SpO2, BP, heart rate and rhythm monitor placed and checked for adequate function Safety Precautions: Patient was assessed for positional comfort and pressure points  before starting the procedure. Time-out: I initiated and conducted the "Time-out" before starting the procedure, as per protocol. The patient was asked to participate by confirming the accuracy of the "Time Out" information. Verification of the correct person, site, and procedure were performed and confirmed by me, the nursing staff, and the patient. "Time-out" conducted as per Joint Commission's Universal Protocol (UP.01.01.01). Time: 1054  Description of Procedure:          Area Prepped: Entire Upper Back Region Prepping solution: ChloraPrep (2% chlorhexidine gluconate and 70% isopropyl alcohol) Safety Precautions: Aspiration looking for blood return was conducted prior to all injections. At no point did we inject any substances, as a needle was being advanced. No attempts were made at seeking any paresthesias. Safe injection practices and needle disposal techniques used. Medications properly checked for expiration dates. SDV (single dose vial) medications used. Description of the Procedure: Protocol guidelines were followed. The patient was placed in position over the fluoroscopy table. The target area was identified and the area prepped in the usual manner. Skin & deeper tissues infiltrated with local anesthetic. Appropriate amount of time allowed to pass for local anesthetics to take effect. The procedure needles were then advanced to the target area. Proper needle placement secured. Negative aspiration confirmed. Solution injected in intermittent fashion, asking for systemic symptoms every 0.5cc of injectate. The needles were then removed and the area cleansed, making sure to leave some of the prepping solution back to take advantage of its long term bactericidal properties.  Vitals:   02/20/18 1036 02/20/18 1100  BP: 133/88 139/82  Pulse: 99 95  Resp: 16 16  Temp: 98.2 F (36.8 C) 97.7 F (36.5 C)  SpO2: 94% 95%  Weight: 180 lb (81.6 kg)   Height: 5\' 4"  (1.626 m)     Start Time: 1055  hrs. End Time: 1101 hrs. Materials:  Needle(s) Type: Epidural needle Gauge: 20G Length: 3.5-in Medication(s): Please see orders for medications and dosing details. Approximately 10 trigger points injected with 1 cc of 0.2% ropivacaine injected in each trigger point.  Dry needling was also performed after injection of ropivacaine. Imaging Guidance:          Type of Imaging Technique: None used Indication(s): N/A Exposure Time: No patient exposure Contrast: None used. Fluoroscopic Guidance: N/A Ultrasound Guidance: N/A Interpretation: N/A  Antibiotic Prophylaxis:   Anti-infectives (From admission, onward)   None     Indication(s): None identified  Post-operative Assessment:  Post-procedure Vital Signs:  Pulse/HCG Rate: 95  Temp: 97.7 F (36.5 C) Resp: 16 BP: 139/82 SpO2: 95 %(2 L O2)  EBL: None  Complications: No immediate post-treatment complications observed by team, or reported by patient.  Note: The patient tolerated the entire procedure well. A repeat set of vitals were taken after the procedure and the patient was kept under observation following institutional policy, for this type of procedure. Post-procedural neurological assessment was performed, showing return to baseline, prior to discharge. The patient was provided with post-procedure discharge instructions, including a section on how to identify potential problems. Should any problems arise concerning this procedure, the patient was given instructions to immediately contact us, at any time, without hesitation. In any case, we plan to contact the patient by telephone for a follow-up status report regarding this interventional procedure.  Comments:  No additional relevant information.  Plan of Care   Imaging Orders  No imaging studies ordered today   Procedure Orders    No procedure(s) ordered today   Of note, patient notified me that she has fatty liver disease.  Her most recent metabolic panel shows mild  elevation of her AST and ALT.  At that time, patient was transitioned from hydrocodone to oxycodone.  She follows up today stating that oxycodone is not helpful for her pain and that she would like to go back to hydrocodone.  This is reasonable.  Recommend every 3 month to q. 74-month liver function tests to monitor AST and ALT.  Should her liver function numbers start to increase, we can consider alternatives but given that the patient experienced pain relief and was managing her pain relatively well with hydrocodone, will go back.  Prescription below for 3 months.  Medications ordered for procedure: Meds ordered this encounter  Medications  . ropivacaine (PF) 2 mg/mL (0.2%) (NAROPIN) injection 10 mL  . DISCONTD: HYDROcodone-acetaminophen (NORCO) 7.5-325 MG tablet    Sig: Take 1 tablet by mouth 3 (three) times daily as needed for moderate pain.    Dispense:  90 tablet    Refill:  0    Do not place this medication, or any other prescription from our practice, on "Automatic Refill". Patient may have prescription filled one day early if pharmacy is closed on scheduled refill date. For chronic pain To fill on or after: 02-20-2018, 03-22-2018, 04-21-2018  . DISCONTD: HYDROcodone-acetaminophen (NORCO) 7.5-325 MG tablet    Sig: Take 1 tablet by mouth 3 (three) times daily as needed for moderate pain.    Dispense:  90 tablet    Refill:  0    Do not place this medication, or any other prescription from our practice, on "Automatic Refill". Patient may have prescription filled one day early if pharmacy is closed on scheduled refill date. For chronic pain To fill on or after: 02-20-2018, 03-22-2018, 04-21-2018  . HYDROcodone-acetaminophen (NORCO) 7.5-325 MG tablet    Sig: Take 1 tablet by mouth 3 (three) times daily as needed for moderate pain.    Dispense:  90 tablet    Refill:  0    Do not place this medication, or  any other prescription from our practice, on "Automatic Refill". Patient may have prescription  filled one day early if pharmacy is closed on scheduled refill date. For chronic pain To fill on or after: 02-20-2018, 03-22-2018, 04-21-2018   Medications administered: We administered ropivacaine (PF) 2 mg/mL (0.2%).  See the medical record for exact dosing, route, and time of administration.  New Prescriptions   HYDROCODONE-ACETAMINOPHEN (NORCO) 7.5-325 MG TABLET    Take 1 tablet by mouth 3 (three) times daily as needed for moderate pain.   Disposition: Discharge home  Discharge Date & Time: 02/20/2018; 1107 hrs.   Physician-requested Follow-up: Keep follow-up in October  Future Appointments  Date Time Provider [place]  02/22/2018  2:20 PM Steele Sizer, MD [facility]  03/01/2018  3:45 PM Marja Kays, RD [facility] None  05/11/2018  3:00 PM Rubie Maid, MD [facility] None  05/18/2018 12:00 PM Gillis Santa, MD [facility] None  01/15/2019  2:45 PM [facility] None   Primary Care Physician: Steele Sizer, MD Location: [facility] Note by: Gillis Santa, MD Date: 02/20/2018; Time: 11:19 AM  Disclaimer:  Medicine is not an exact science. The only guarantee in medicine is that nothing is guaranteed. It is important to note that the decision to proceed with this intervention was based on the information collected from the patient. The Data and conclusions were drawn from the patient's questionnaire, the interview, and the physical examination. Because the information was provided in large part by the patient, it cannot be guaranteed that it has not been purposely or unconsciously manipulated. Every effort has been made to obtain as much relevant data as possible for this evaluation. It is important to note that the conclusions that lead to this procedure are derived in large part from the available data. Always take into account that the treatment will also be dependent on availability of resources and existing treatment  guidelines, considered by other Pain Management Practitioners as being common knowledge and practice, at the time of the intervention. For Medico-Legal purposes, it is also important to point out that variation in procedural techniques and pharmacological choices are the acceptable norm. The indications, contraindications, technique, and results of the above procedure should only be interpreted and judged by a Board-Certified Interventional Pain Specialist with extensive familiarity and expertise in the same exact procedure and technique.

## 2018-02-20 NOTE — Progress Notes (Signed)
Nursing Pain Medication Assessment:  Safety precautions to be maintained throughout the outpatient stay will include: orient to surroundings, keep bed in low position, maintain call bell within reach at all times, provide assistance with transfer out of bed and ambulation.  Medication Inspection Compliance: Pill count conducted under aseptic conditions, in front of the patient. Neither the pills nor the bottle was removed from the patient's sight at any time. Once count was completed pills were immediately returned to the patient in their original bottle.  Medication: Oxycodone IR Pill/Patch Count: 78 of 90 pills remain Pill/Patch Appearance: Markings consistent with prescribed medication Bottle Appearance: Standard pharmacy container. Clearly labeled. Filled Date: 7 / 49 / 2019 Last Medication intake:  Today   2 remaining Oxycodone prescriptions were voided by Dr. Holley Raring.  78/90 Oxycodone pills flushed on site this am; witnessed by pt and Clementeen Graham, RN

## 2018-02-20 NOTE — Progress Notes (Signed)
Safety precautions to be maintained throughout the outpatient stay will include: orient to surroundings, keep bed in low position, maintain call bell within reach at all times, provide assistance with transfer out of bed and ambulation.  

## 2018-02-21 ENCOUNTER — Telehealth: Payer: Self-pay | Admitting: *Deleted

## 2018-02-21 ENCOUNTER — Telehealth: Payer: Self-pay

## 2018-02-21 NOTE — Telephone Encounter (Signed)
Pt said Pharmacist wants someone to call about hydrocodone rx [facility] in [place]

## 2018-02-21 NOTE — Telephone Encounter (Signed)
Denies problems post procedure.

## 2018-02-21 NOTE — Telephone Encounter (Signed)
Pharmacy notified ok to fill, pills from last prescription were discarded.

## 2018-02-22 ENCOUNTER — Ambulatory Visit: Payer: Medicaid Other | Admitting: Family Medicine

## 2018-02-24 ENCOUNTER — Other Ambulatory Visit
Admission: RE | Admit: 2018-02-24 | Discharge: 2018-02-24 | Disposition: A | Discharge status: Home / Self Care | Payer: Medicaid Other | Source: Ambulatory Visit | Attending: Family Medicine | Admitting: Family Medicine

## 2018-02-24 ENCOUNTER — Encounter: Payer: Self-pay | Admitting: Family Medicine

## 2018-02-24 ENCOUNTER — Ambulatory Visit: Payer: Medicaid Other | Admitting: Family Medicine

## 2018-02-24 ENCOUNTER — Other Ambulatory Visit: Payer: Self-pay | Admitting: Family Medicine

## 2018-02-24 VITALS — BP 122/78 | HR 86 | Temp 98.1°F | Resp 14 | Ht 64.0 in | Wt 184.2 lb

## 2018-02-24 DIAGNOSIS — F411 Generalized anxiety disorder: Secondary | ICD-10-CM

## 2018-02-24 DIAGNOSIS — J432 Centrilobular emphysema: Secondary | ICD-10-CM | POA: Insufficient documentation

## 2018-02-24 DIAGNOSIS — F339 Major depressive disorder, recurrent, unspecified: Secondary | ICD-10-CM

## 2018-02-24 DIAGNOSIS — R0902 Hypoxemia: Secondary | ICD-10-CM | POA: Insufficient documentation

## 2018-02-24 DIAGNOSIS — E213 Hyperparathyroidism, unspecified: Secondary | ICD-10-CM

## 2018-02-24 DIAGNOSIS — E781 Pure hyperglyceridemia: Secondary | ICD-10-CM

## 2018-02-24 DIAGNOSIS — I10 Essential (primary) hypertension: Secondary | ICD-10-CM

## 2018-02-24 DIAGNOSIS — I479 Paroxysmal tachycardia, unspecified: Secondary | ICD-10-CM

## 2018-02-24 LAB — BLOOD GAS, VENOUS
Acid-Base Excess: 4.5 mmol/L — ABNORMAL HIGH (ref 0.0–2.0)
BICARBONATE: 30.8 mmol/L — AB (ref 20.0–28.0)
O2 Saturation: 60.1 %
PATIENT TEMPERATURE: 37
PO2 VEN: 32 mmHg (ref 32.0–45.0)
pCO2, Ven: 52 mmHg (ref 44.0–60.0)
pH, Ven: 7.38 (ref 7.250–7.430)

## 2018-02-24 MED ORDER — OMEGA-3-ACID ETHYL ESTERS 1 G PO CAPS: 2.0000 g | ORAL_CAPSULE | Freq: Two times a day (BID) | ORAL | 5 refills | Status: DC

## 2018-02-24 MED ORDER — DIVALPROEX SODIUM 250 MG PO DR TAB: 250.0000 mg | DELAYED_RELEASE_TABLET | Freq: Every day | ORAL | 5 refills | Status: DC

## 2018-02-24 MED ORDER — VENLAFAXINE HCL ER 75 MG PO CP24: 225.0000 mg | ORAL_CAPSULE | Freq: Every day | ORAL | 0 refills | Status: DC

## 2018-02-24 MED ORDER — LOSARTAN POTASSIUM 100 MG PO TABS
100.0000 mg | ORAL_TABLET | Freq: Every day | ORAL | 0 refills | Status: DC
Start: 2018-02-24 — End: 2018-03-22

## 2018-02-24 NOTE — Progress Notes (Signed)
Name: Judy Allen   MRN: [id number]    DOB: 1956-01-27   Date:02/24/2018       Progress Note  Subjective  Chief Complaint  Chief Complaint  Patient presents with  . Follow-up    patient is here to review her labs from [facility]  . Hypertension  . Hyperlipidemia  . Dizziness    HPI  Hypertriglyceridemia: seeing Dr. Gabriel Carina, taking Lovaza and Gemfibrozil, tolerating medication well, she has not seen dietician yet, but has an appointment scheduled. She has not changed her diet yet.   HTN: seen by pulmonologist and advised to stop HCTZ because calcium was high, we will change to losartan today and recheck bp on Monday. She denies chest pain or palpitation  Hyperparathyroidism: had labs done this week, pulmonologist asked for her to follow up with me today. She denies kidney stones, GERD is under control, she states she drinks a lot of water, because her mouth is dry and denies change in amount. No muscle pain.   Emphysema and hypoxemia: had a walk test this week with pulmonologist and is very concerned about drop of oxygenation, advised to have repeat venous gas, we will order it today as requested and fax back results to Dr. Olena Heckle at [facility]   Major Depression: much worse, husband died from complications of pneumonia two months ago, she has no energy, crying all the time also daughter states she has been very snappy. Denies suicidal thoughts or ideation. I will adjust medication today  Patient Active Problem List   Diagnosis Date Noted  . Perihepatitis ([place]) 09/27/2017  . Abnormal liver function test 09/27/2017  . Fatty liver 12/22/2016  . Chronic respiratory failure with hypoxia, on home oxygen therapy ([place]) 07/28/2016  . Centrilobular emphysema ([place]) 07/14/2016  . Abnormal Pap smear of cervix 04/14/2016  . Anal squamous cell carcinoma ([place]) 03/09/2016  . Metastatic squamous cell carcinoma ([place]) 02/25/2016  . Non-thrombocytopenic purpura ([place]) 01/12/2016  . Chronic constipation  10/16/2015  . History of fusion of cervical spine 04/02/2015  . Benign hypertension 01/16/2015  . Chronic cervical pain 01/16/2015  . CN (constipation) 01/16/2015  . Gastric reflux 01/16/2015  . Hypertriglyceridemia 01/16/2015  . Edema leg 01/16/2015  . Chronic recurrent major depressive disorder ([place]) 01/16/2015  . Dysmetabolic syndrome 01/60/1093  . Obstructive apnea 01/16/2015  . Psoriasis 01/16/2015  . Allergic rhinitis 01/16/2015  . Bursitis, trochanteric 01/16/2015  . GAD (generalized anxiety disorder) 01/16/2015  . Tachycardia, paroxysmal [facility])     Past Surgical History:  Procedure Laterality Date  . ANTERIOR FUSION LUMBAR SPINE  1990   plate in back, not metal  . CARDIAC CATHETERIZATION  2009?    [facility];[place]  . CARDIAC CATHETERIZATION  05/2012   [facility]: Mild hypertension 37/12 with a mean pressure of 21 mm mercury  . CHOLECYSTECTOMY N/A 09/12/2017   Procedure: LAPAROSCOPIC CHOLECYSTECTOMY WITH INTRAOPERATIVE CHOLANGIOGRAM;  Surgeon: Robert Bellow, MD;  Location: [facility] ORS;  Service: General;  Laterality: N/A;  . COLONOSCOPY  2018  . herniated disc repair  2001  . LIVER BIOPSY N/A 09/12/2017   Procedure: LIVER BIOPSY;  Surgeon: Robert Bellow, MD;  Location: [facility] ORS;  Service: General;  Laterality: N/A;  . MASS EXCISION Left 02/23/2016   Procedure: EXCISION MASS;  Surgeon: Christene Lye, MD;  Location: [facility] ORS;  Service: General;  Laterality: Left;  . NECK SURGERY    . PORT-A-CATH REMOVAL  2018  . PORTACATH PLACEMENT Left 03/12/2016   Procedure: INSERTION PORT-A-CATH;  Surgeon: Daryel Gerald  Robinette Haines, MD;  Location: [facility] ORS;  Service: General;  Laterality: Left;  . tonsillectomy  1959   history  . TUBAL LIGATION     s/p    Family History  Problem Relation Age of Onset  . Heart attack Mother   . Asthma Mother   . Liver cancer Father   . Breast cancer Maternal Aunt   . Bladder Cancer Neg Hx   . Kidney cancer Neg Hx     Social History   Socioeconomic  History  . Marital status: Widowed    Spouse name: Automotive engineer  . Number of children: 3  . Years of education: Not on file  . Highest education level: 11th grade  Occupational History  . Not on file  Social Needs  . Financial resource strain: Somewhat hard  . Food insecurity:    Worry: Sometimes true    Inability: Sometimes true  . Transportation needs:    Medical: No    Non-medical: No  Tobacco Use  . Smoking status: Former Smoker    Packs/day: 1.00    Years: 32.00    Pack years: 32.00    Types: Cigarettes    Start date: 08/10/1963    Last attempt to quit: 01/15/2006    Years since quitting: 12.1  . Smokeless tobacco: Never Used  Substance and Sexual Activity  . Alcohol use: No    Alcohol/week: 0.0 oz  . Drug use: No  . Sexual activity: Never    Birth control/protection: Post-menopausal  Lifestyle  . Physical activity:    Days per week: 0 days    Minutes per session: 0 min  . Stress: Very much  Relationships  . Social connections:    Talks on phone: More than three times a week    Gets together: Once a week    Attends religious service: Never    Active member of club or organization: No    Attends meetings of clubs or organizations: Never    Relationship status: Widowed  . Intimate partner violence:    Fear of current or ex partner: No    Emotionally abused: No    Physically abused: No    Forced sexual activity: No  Other Topics Concern  . Not on file  Social History Narrative   Husband passed away on Dec 22, 2017     Current Outpatient Medications:  .  albuterol (PROVENTIL HFA;VENTOLIN HFA) 108 (90 Base) MCG/ACT inhaler, Inhale 2 puffs into the lungs every 6 (six) hours as needed for wheezing or shortness of breath., Disp: , Rfl:  .  betamethasone dipropionate (DIPROLENE) 0.05 % cream, Apply 1 application topically 2 (two) times daily as needed., Disp: , Rfl:  .  budesonide-formoterol (SYMBICORT) 160-4.5 MCG/ACT inhaler, Inhale 2 puffs into the lungs 2 (two)  times daily., Disp: , Rfl:  .  divalproex (DEPAKOTE) 250 MG DR tablet, Take 1 tablet (250 mg total) by mouth at bedtime., Disp: 30 tablet, Rfl: 5 .  fluticasone (FLONASE) 50 MCG/ACT nasal spray, Place 2 sprays into the nose daily., Disp: , Rfl:  .  gabapentin (NEURONTIN) 300 MG capsule, Take 1 capsule (300 mg total) by mouth 3 (three) times daily., Disp: 90 capsule, Rfl: 3 .  gemfibrozil (LOPID) 600 MG tablet, Take 1 tablet by mouth 2 (two) times daily., Disp: , Rfl:  .  HYDROcodone-acetaminophen (NORCO) 7.5-325 MG tablet, Take 1 tablet by mouth 3 (three) times daily as needed for moderate pain., Disp: 90 tablet, Rfl: 0 .  hydrocortisone 2.5 % cream, Apply 1 application topically 2 (two) times daily., Disp: , Rfl:  .  hydrOXYzine (ATARAX/VISTARIL) 25 MG tablet, TK 1 T PO QHS, Disp: , Rfl: 1 .  ketoconazole (NIZORAL) 2 % cream, Apply 1 application topically 2 (two) times daily., Disp: , Rfl:  .  loratadine (CLARITIN) 10 MG tablet, Take 1 tablet (10 mg total) by mouth daily., Disp: 30 tablet, Rfl: 5 .  metoprolol succinate (TOPROL-XL) 50 MG 24 hr tablet, Take 1 tablet (50 mg total) by mouth daily., Disp: 30 tablet, Rfl: 5 .  mirabegron ER (MYRBETRIQ) 50 MG TB24 tablet, Take 1 tablet (50 mg total) by mouth daily., Disp: 30 tablet, Rfl: 11 .  omega-3 acid ethyl esters (LOVAZA) 1 g capsule, Take 2 capsules (2 g total) by mouth 2 (two) times daily., Disp: 120 capsule, Rfl: 5 .  omeprazole (PRILOSEC) 40 MG capsule, Take 1 capsule (40 mg total) by mouth daily., Disp: 30 capsule, Rfl: 5 .  oxyCODONE (OXY IR/ROXICODONE) 5 MG immediate release tablet, Take 1 tablet (5 mg total) by mouth 3 (three) times daily as needed for severe pain., Disp: 90 tablet, Rfl: 0 .  OXYGEN, Inhale 2 L into the lungs continuous. , Disp: , Rfl:  .  tiZANidine (ZANAFLEX) 4 MG tablet, Take 1 tablet (4 mg total) by mouth every 8 (eight) hours as needed., Disp: 90 tablet, Rfl: 3 .  triamcinolone cream (KENALOG) 0.1 %, APP TOPICALLY AA  BID, Disp: , Rfl: 0 .  venlafaxine XR (EFFEXOR-XR) 75 MG 24 hr capsule, Take 3 capsules (225 mg total) by mouth daily with breakfast., Disp: 90 capsule, Rfl: 0 .  losartan (COZAAR) 100 MG tablet, Take 1 tablet (100 mg total) by mouth daily. In place of HCTZ, Disp: 30 tablet, Rfl: 0 .  tiotropium (SPIRIVA) 18 MCG inhalation capsule, Place 18 mcg into inhaler and inhale daily. , Disp: , Rfl:   Allergies  Allergen Reactions  . Dapsone Other (See Comments)    Hypoxemia  . Sulfa Antibiotics Rash  . Sulfasalazine Rash     ROS  Constitutional: Negative for fever or weight change.  Respiratory: Negative for cough, positive for  shortness of breath - on continuous oxygen.   Cardiovascular: Negative for chest pain or palpitations.  Gastrointestinal: Negative for abdominal pain, no bowel changes.  Musculoskeletal: Negative for gait problem or joint swelling.  Skin: Positive  for chronic rash.  Neurological: Negative for dizziness or headache.  No other specific complaints in a complete review of systems (except as listed in HPI above).  Objective  Vitals:   02/24/18 0746  BP: 122/78  Pulse: 86  Resp: 14  Temp: 98.1 F (36.7 C)  TempSrc: Oral  SpO2: 94%  Weight: 184 lb 3.2 oz (83.6 kg)  Height: 5\' 4"  (1.626 m)    Body mass index is 31.62 kg/m.  Physical Exam  Constitutional: Patient appears well-developed and well-nourished. Obese No distress.  HEENT: head atraumatic, normocephalic, pupils equal and reactive to light, neck supple, throat within normal limits Cardiovascular: Normal rate, regular rhythm and normal heart sounds.  No murmur heard. No BLE edema. Pulmonary/Chest: Effort normal and breath sounds normal. No respiratory distress at rest, using nasal canula oxygen  Abdominal: Soft.  There is no tenderness. Skin: psoriatic plaques both hands Psychiatric: Patient has a depressed mood, cried during visit. Judgment and thought content normal.  Recent Results (from the past  2160 hour(s))  Lipid panel     Status: Abnormal   Collection  Time: 12/06/17  1:40 PM  Result Value Ref Range   Cholesterol 269 (H) <200 mg/dL   HDL 35 (L) >50 mg/dL   Triglycerides 778 (H) <150 mg/dL   LDL Cholesterol (Calc)  mg/dL (calc)    Comment: . LDL cholesterol not calculated. Triglyceride levels greater than 400 mg/dL invalidate calculated LDL results. . Reference range: <100 . Desirable range <100 mg/dL for primary prevention;   <70 mg/dL for patients with CHD or diabetic patients  with > or = 2 CHD risk factors. Marland Kitchen LDL-C is now calculated using the Martin-Hopkins  calculation, which is a validated novel method providing  better accuracy than the Friedewald equation in the  estimation of LDL-C.  [name]. 5176;160(73): 2061-2068  (http://education.[employer].com/faq/FAQ164)    Total CHOL/HDL Ratio 7.7 (H) <5.0 (calc)   Non-HDL Cholesterol (Calc) 234 (H) <130 mg/dL (calc)    Comment: Non-HDL level > or = 220 is very high and may indicate  genetic familial hypercholesterolemia (FH). Clinical  assessment and measurement of blood lipid levels  should be considered for all first-degree relatives  of patients with an FH diagnosis. . For patients with diabetes plus 1 major ASCVD risk  factor, treating to a non-HDL-C goal of <100 mg/dL  (LDL-C of <70 mg/dL) is considered a therapeutic  option.   COMPLETE METABOLIC PANEL WITH GFR     Status: Abnormal   Collection Time: 12/06/17  1:40 PM  Result Value Ref Range   Glucose, Bld 94 65 - 99 mg/dL    Comment: .            Fasting reference interval .    BUN 10 7 - 25 mg/dL   Creat 0.90 0.50 - 0.99 mg/dL    Comment: For patients >17 years of age, the reference limit for Creatinine is approximately 13% higher for people identified as African-American. .    GFR, Est Non African American 69 > OR = 60 mL/min/1.44m2   GFR, Est African American 79 > OR = 60 mL/min/1.22m2   BUN/Creatinine Ratio NOT  APPLICABLE 6 - 22 (calc)   Sodium 140 135 - 146 mmol/L   Potassium 3.9 3.5 - 5.3 mmol/L   Chloride 100 98 - 110 mmol/L   CO2 30 20 - 32 mmol/L   Calcium 9.8 8.6 - 10.4 mg/dL   Total Protein 7.2 6.1 - 8.1 g/dL   Albumin 4.5 3.6 - 5.1 g/dL   Globulin 2.7 1.9 - 3.7 g/dL (calc)   AG Ratio 1.7 1.0 - 2.5 (calc)   Total Bilirubin 0.5 0.2 - 1.2 mg/dL   Alkaline phosphatase (APISO) 63 33 - 130 U/L   AST 39 (H) 10 - 35 U/L   ALT 35 (H) 6 - 29 U/L      PHQ2/9: Depression screen [facility] 2/9 02/24/2018 02/20/2018 02/16/2018 01/18/2018 12/15/2017  Decreased Interest 1 0 0 0 0  Down, Depressed, Hopeless 3 0 0 0 0  PHQ - 2 Score 4 0 0 0 0  Altered sleeping 3 - - - -  Tired, decreased energy 3 - - - -  Change in appetite 0 - - - -  Feeling bad or failure about yourself  0 - - - -  Trouble concentrating 1 - - - -  Moving slowly or fidgety/restless 0 - - - -  Suicidal thoughts 0 - - - -  PHQ-9 Score 11 - - - -  Difficult doing work/chores Very difficult - - - -  Some recent  data might be hidden     Fall Risk: Fall Risk  02/24/2018 02/20/2018 02/16/2018 01/18/2018 12/15/2017  Falls in the past year? No No No No No  Number falls in past yr: - - - - -  Injury with Fall? - - - - -  Follow up - - - - -     Functional Status Survey: Is the patient deaf or have difficulty hearing?: No Does the patient have difficulty seeing, even when wearing glasses/contacts?: No Does the patient have difficulty concentrating, remembering, or making decisions?: Yes Does the patient have difficulty walking or climbing stairs?: No Does the patient have difficulty dressing or bathing?: No Does the patient have difficulty doing errands alone such as visiting a doctor's office or shopping?: No    Assessment & Plan  1. High triglycerides  Seen by Dr. Gabriel Carina on lovaza and gemfibrozil - omega-3 acid ethyl esters (LOVAZA) 1 g capsule; Take 2 capsules (2 g total) by mouth 2 (two) times daily.  Dispense: 120 capsule; Refill:  5  2. GAD (generalized anxiety disorder)  - venlafaxine XR (EFFEXOR-XR) 75 MG 24 hr capsule; Take 3 capsules (225 mg total) by mouth daily with breakfast.  Dispense: 90 capsule; Refill: 0  3. Chronic recurrent major depressive disorder ([place])  Worse since husband died 2 months ago, we will increase dose of Effexor, explained need to see therapist and psychiatrist but she refuses at this time - venlafaxine XR (EFFEXOR-XR) 75 MG 24 hr capsule; Take 3 capsules (225 mg total) by mouth daily with breakfast.  Dispense: 90 capsule; Refill: 0 - divalproex (DEPAKOTE) 250 MG DR tablet; Take 1 tablet (250 mg total) by mouth at bedtime.  Dispense: 30 tablet; Refill: 5  4. Benign hypertension  - losartan (COZAAR) 100 MG tablet; Take 1 tablet (100 mg total) by mouth daily. In place of HCTZ  Dispense: 30 tablet; Refill: 0  5. Tachycardia, paroxysmal ([facility])  Controlled with beta blocker  6. Centrilobular emphysema ([place])  Hypoxia is getting worse and pulmonologist asked to repeat venous gas   7. Hyperparathyroidism ([place])  - Ambulatory referral to Endocrinology  8. Hypoxemia  - Blood Gas, Venous Requested by pulmonologist and we will fax results back to her

## 2018-02-27 ENCOUNTER — Ambulatory Visit: Payer: Medicaid Other | Admitting: Family Medicine

## 2018-02-27 LAB — HEMOGLOBIN A1C: Hgb A1c MFr Bld: 6.6 — AB (ref 4.0–6.0)

## 2018-03-01 ENCOUNTER — Ambulatory Visit: Payer: Medicaid Other | Admitting: Dietician

## 2018-03-05 ENCOUNTER — Encounter: Payer: Self-pay | Admitting: Family Medicine

## 2018-03-07 ENCOUNTER — Ambulatory Visit
Admission: RE | Admit: 2018-03-07 | Discharge: 2018-03-07 | Disposition: A | Discharge status: Home / Self Care | Payer: Medicaid Other | Source: Ambulatory Visit | Attending: Family Medicine | Admitting: Family Medicine

## 2018-03-07 DIAGNOSIS — Z1231 Encounter for screening mammogram for malignant neoplasm of breast: Secondary | ICD-10-CM | POA: Diagnosis not present

## 2018-03-10 ENCOUNTER — Encounter: Payer: Self-pay | Admitting: Dietician

## 2018-03-14 ENCOUNTER — Encounter: Payer: Medicaid Other | Admitting: Student in an Organized Health Care Education/Training Program

## 2018-03-22 ENCOUNTER — Ambulatory Visit: Payer: Medicaid Other | Admitting: *Deleted

## 2018-03-22 ENCOUNTER — Other Ambulatory Visit: Payer: Self-pay | Admitting: Family Medicine

## 2018-03-22 ENCOUNTER — Other Ambulatory Visit: Payer: Self-pay | Admitting: Student in an Organized Health Care Education/Training Program

## 2018-03-22 DIAGNOSIS — I10 Essential (primary) hypertension: Secondary | ICD-10-CM

## 2018-03-22 DIAGNOSIS — G8929 Other chronic pain: Secondary | ICD-10-CM

## 2018-03-22 DIAGNOSIS — M542 Cervicalgia: Principal | ICD-10-CM

## 2018-03-22 DIAGNOSIS — F339 Major depressive disorder, recurrent, unspecified: Secondary | ICD-10-CM

## 2018-03-22 DIAGNOSIS — F411 Generalized anxiety disorder: Secondary | ICD-10-CM

## 2018-03-23 ENCOUNTER — Telehealth: Payer: Self-pay

## 2018-03-23 NOTE — Telephone Encounter (Signed)
Spoke with patient to let her know that I am sending PA for her hydrocodone - apap 7.5-325 mg via [employer] and to check back with her pharmacy on tomorrow after approx 24 hours.

## 2018-03-23 NOTE — Telephone Encounter (Signed)
Her hydrocodone needs prior auth and she runs out tonight. [facility].

## 2018-03-27 ENCOUNTER — Telehealth: Payer: Self-pay | Admitting: Student in an Organized Health Care Education/Training Program

## 2018-03-27 ENCOUNTER — Ambulatory Visit: Payer: Self-pay | Admitting: *Deleted

## 2018-03-27 NOTE — Telephone Encounter (Signed)
Medication that needs approval is Hydrocodone - apap 7.5/325mg .  She has dropped this off to pharmacy.   Went through [employer] and when the request wouldn't go through, I chatted with the help desk and they stated that request had already been sent and was approved.    Called patient back to let her know, states that she had medicaid with the request that I sent on 03/23/18 and now has [place].  States she spoke with pharmacy this morning and they had not received authorization.  She will phone them back and will let me know if she needs afuther assistance.

## 2018-03-27 NOTE — Telephone Encounter (Signed)
Patient needs prior auth for meds thru [employer] [place] Value ID# [id number]

## 2018-03-27 NOTE — Telephone Encounter (Signed)
Voicemail left with patient to call back with medication and additional information needed in order to complete PA

## 2018-03-27 NOTE — Telephone Encounter (Signed)
After speaking with Dr Holley Raring, patient will need to pickup Rx for 7 days and we will escribe 2 additional prescriptions for her to cover the 03/22/18 and 04/21/18.  The new dates will be 04/03/18, 05/03/18 making her Med refill date 06/02/18.  Patient in agreement with these dates and has an appt for medication management on 05/18/18.  Patient will bring 04/21/18 Rx back to [place] for void at her convenience and patient educated medications will have to last until 06/02/18.  Patient verbalizes u/o information.

## 2018-03-28 MED ORDER — HYDROCODONE-ACETAMINOPHEN 7.5-325 MG PO TABS: 1.0000 | ORAL_TABLET | Freq: Three times a day (TID) | ORAL | 0 refills | Status: AC | PRN

## 2018-03-28 MED ORDER — HYDROCODONE-ACETAMINOPHEN 7.5-325 MG PO TABS: 1.0000 | ORAL_TABLET | Freq: Three times a day (TID) | ORAL | 0 refills | Status: DC | PRN

## 2018-03-28 NOTE — Telephone Encounter (Signed)
See nursing note below. Insurance change for patient so pharmacy would only fill first 7 days. Will e-prescribe the remainder. See nursing notes for details.  Requested Prescriptions   Signed Prescriptions Disp Refills  . tiZANidine (ZANAFLEX) 4 MG tablet 90 tablet 1    Sig: TAKE 1 TABLET BY MOUTH EVERY 8 HOURS AS NEEDED    Authorizing Provider: Wardell Pokorski  . HYDROcodone-acetaminophen (NORCO) 7.5-325 MG tablet 90 tablet 0    Sig: Take 1 tablet by mouth 3 (three) times daily as needed for moderate pain.    Authorizing Provider: Gillis Santa  . HYDROcodone-acetaminophen (NORCO) 7.5-325 MG tablet 90 tablet 0    Sig: Take 1 tablet by mouth 3 (three) times daily as needed for moderate pain.    Authorizing Provider: Gillis Santa

## 2018-04-04 ENCOUNTER — Ambulatory Visit: Payer: Self-pay | Admitting: *Deleted

## 2018-05-11 ENCOUNTER — Encounter: Payer: Medicaid Other | Admitting: Obstetrics and Gynecology

## 2018-05-15 ENCOUNTER — Encounter: Payer: Self-pay | Admitting: Obstetrics and Gynecology

## 2018-05-15 ENCOUNTER — Other Ambulatory Visit (HOSPITAL_COMMUNITY)
Admission: RE | Admit: 2018-05-15 | Discharge: 2018-05-15 | Disposition: A | Discharge status: Home / Self Care | Payer: BLUE CROSS/BLUE SHIELD | Source: Ambulatory Visit | Attending: Obstetrics and Gynecology | Admitting: Obstetrics and Gynecology

## 2018-05-15 ENCOUNTER — Ambulatory Visit (INDEPENDENT_AMBULATORY_CARE_PROVIDER_SITE_OTHER): Payer: BLUE CROSS/BLUE SHIELD | Admitting: Obstetrics and Gynecology

## 2018-05-15 VITALS — BP 149/40 | HR 84 | Ht 64.0 in | Wt 180.4 lb

## 2018-05-15 DIAGNOSIS — Z01419 Encounter for gynecological examination (general) (routine) without abnormal findings: Secondary | ICD-10-CM

## 2018-05-15 DIAGNOSIS — N952 Postmenopausal atrophic vaginitis: Secondary | ICD-10-CM

## 2018-05-15 DIAGNOSIS — N8111 Cystocele, midline: Secondary | ICD-10-CM

## 2018-05-15 DIAGNOSIS — R87610 Atypical squamous cells of undetermined significance on cytologic smear of cervix (ASC-US): Secondary | ICD-10-CM

## 2018-05-15 DIAGNOSIS — Z01411 Encounter for gynecological examination (general) (routine) with abnormal findings: Secondary | ICD-10-CM | POA: Diagnosis not present

## 2018-05-15 DIAGNOSIS — I10 Essential (primary) hypertension: Secondary | ICD-10-CM | POA: Diagnosis not present

## 2018-05-15 DIAGNOSIS — R369 Urethral discharge, unspecified: Secondary | ICD-10-CM

## 2018-05-15 DIAGNOSIS — Z923 Personal history of irradiation: Secondary | ICD-10-CM

## 2018-05-15 DIAGNOSIS — E785 Hyperlipidemia, unspecified: Secondary | ICD-10-CM

## 2018-05-15 DIAGNOSIS — Z85048 Personal history of other malignant neoplasm of rectum, rectosigmoid junction, and anus: Secondary | ICD-10-CM

## 2018-05-15 MED ORDER — ESTROGENS, CONJUGATED 0.625 MG/GM VA CREA: 1.0000 g | TOPICAL_CREAM | VAGINAL | 3 refills | Status: DC

## 2018-05-15 NOTE — Progress Notes (Signed)
ANNUAL PREVENTATIVE CARE GYNECOLOGY  ENCOUNTER NOTE  Subjective:       Judy Allen is a 62 y.o. G75P4 female here for a routine annual gynecologic exam. She has a PMH significant for anal squamous cell carcinoma s/p radiation therapy in 2017. The patient is not sexually active. The patient is not taking hormone replacement therapy. Patient denies post-menopausal vaginal bleeding. The patient wears seatbelts: yes. The patient participates in regular exercise: no. Has the patient ever been transfused or tattooed?: no. The patient reports that there is not domestic violence in her life.  Current complaints: 1.  Patient notes vaginal discharge and possibly leakage urine or or urethral discharge. Does note 1 episode of blood tinged urine several weeks ago. Of note, she complained of urethral discharge ~ 1 year ago. Has been seen by Urology since then.    Gynecologic History Patient's last menstrual period was 03/04/2002 (approximate). Contraception: post menopausal status Last Pap:05/2017. Results were: ASCUS HR HPV neg. Last mammogram: 03/07/2018. Results were: BI-RADS CATEGORY  1: Negative. Last Colonoscopy: 03/2016.  Results were: abnormal - 2 sessile polyps in sigmoid and transverse colon. Multiple diverticula. (performed at [facility]).    Obstetric History OB History  Gravida Para Term Preterm AB Living  4 4       4   SAB TAB Ectopic Multiple Live Births          4    # Outcome Date GA Lbr Len/2nd Weight Sex Delivery Anes PTL Lv  4 Para           3 Para           2 Para           1 Para             Obstetric Comments  1st Menstrual Cycle:  12   1st Pregnancy:  16    Past Medical History:  Diagnosis Date  . Anemia    H/O  . Anxiety   . Arthritis   . Colon cancer ([place]) 2017   anal ca/chemo and rad  . Complication of anesthesia   . Constipation   . COPD (chronic obstructive pulmonary disease) (HCC)    also emphysema  . Depression   . Dysrhythmia    TACHYCARDIA-WELL  CONTROLLED ON METOPROLO  . GERD (gastroesophageal reflux disease)   . H/O allergic rhinitis   . Heart murmur   . Hemorrhoid   . Hyperlipidemia   . Hypertension   . Neck pain, chronic   . Neuritis   . Ovarian failure   . Personal history of chemotherapy   . Personal history of radiation therapy   . PONV (postoperative nausea and vomiting)   . Pre-diabetes 2019   just being followed.  no meds  . Proteinuria   . Psoriasis   . Sleep apnea 09/12/2016   8cm H2O with 21pm oxygen mask: Eson size Medium. Heated humidifier for nasal dryness.  . Tachycardia, paroxysmal ([place])   . Urinary incontinence     Family History  Problem Relation Age of Onset  . Heart attack Mother   . Asthma Mother   . Liver cancer Father   . Breast cancer Maternal Aunt   . Bladder Cancer Neg Hx   . Kidney cancer Neg Hx     Past Surgical History:  Procedure Laterality Date  . ANTERIOR FUSION LUMBAR SPINE  1990   plate in back, not metal  . CARDIAC CATHETERIZATION  2009?    [facility];[place]  .  CARDIAC CATHETERIZATION  05/2012   [facility]: Mild hypertension 37/12 with a mean pressure of 21 mm mercury  . CHOLECYSTECTOMY N/A 09/12/2017   Procedure: LAPAROSCOPIC CHOLECYSTECTOMY WITH INTRAOPERATIVE CHOLANGIOGRAM;  Surgeon: Robert Bellow, MD;  Location: [facility] ORS;  Service: General;  Laterality: N/A;  . COLONOSCOPY  2018  . herniated disc repair  2001  . LIVER BIOPSY N/A 09/12/2017   Procedure: LIVER BIOPSY;  Surgeon: Robert Bellow, MD;  Location: [facility] ORS;  Service: General;  Laterality: N/A;  . MASS EXCISION Left 02/23/2016   Procedure: EXCISION MASS;  Surgeon: Christene Lye, MD;  Location: [facility] ORS;  Service: General;  Laterality: Left;  . NECK SURGERY    . PORT-A-CATH REMOVAL  2018  . PORTACATH PLACEMENT Left 03/12/2016   Procedure: INSERTION PORT-A-CATH;  Surgeon: Christene Lye, MD;  Location: [facility] ORS;  Service: General;  Laterality: Left;  . tonsillectomy  1959   history  . TUBAL LIGATION      s/p    Social History   Socioeconomic History  . Marital status: Widowed    Spouse name: Automotive engineer  . Number of children: 3  . Years of education: Not on file  . Highest education level: 11th grade  Occupational History  . Not on file  Social Needs  . Financial resource strain: Somewhat hard  . Food insecurity:    Worry: Sometimes true    Inability: Sometimes true  . Transportation needs:    Medical: No    Non-medical: No  Tobacco Use  . Smoking status: Former Smoker    Packs/day: 1.00    Years: 32.00    Pack years: 32.00    Types: Cigarettes    Start date: 08/10/1963    Last attempt to quit: 01/15/2006    Years since quitting: 12.3  . Smokeless tobacco: Never Used  Substance and Sexual Activity  . Alcohol use: No    Alcohol/week: 0.0 standard drinks  . Drug use: No  . Sexual activity: Never    Birth control/protection: Post-menopausal  Lifestyle  . Physical activity:    Days per week: 0 days    Minutes per session: 0 min  . Stress: Very much  Relationships  . Social connections:    Talks on phone: More than three times a week    Gets together: Once a week    Attends religious service: Never    Active member of club or organization: No    Attends meetings of clubs or organizations: Never    Relationship status: Widowed  . Intimate partner violence:    Fear of current or ex partner: No    Emotionally abused: No    Physically abused: No    Forced sexual activity: No  Other Topics Concern  . Not on file  Social History Narrative   Husband passed away on 12-28-2017    Current Outpatient Medications on File Prior to Visit  Medication Sig Dispense Refill  . albuterol (PROVENTIL HFA;VENTOLIN HFA) 108 (90 Base) MCG/ACT inhaler Inhale 2 puffs into the lungs every 6 (six) hours as needed for wheezing or shortness of breath.    . divalproex (DEPAKOTE) 250 MG DR tablet TAKE 1 TABLET BY MOUTH EVERY NIGHT AT BEDTIME 30 tablet 5  . fluticasone (FLONASE) 50 MCG/ACT  nasal spray Place 2 sprays into the nose daily.    Marland Kitchen gabapentin (NEURONTIN) 300 MG capsule Take 1 capsule (300 mg total) by mouth 3 (three) times daily. 90 capsule 3  .  gemfibrozil (LOPID) 600 MG tablet Take 1 tablet by mouth 2 (two) times daily.    Marland Kitchen HYDROcodone-acetaminophen (NORCO) 7.5-325 MG tablet Take 1 tablet by mouth 3 (three) times daily as needed for moderate pain. 90 tablet 0  . hydrocortisone 2.5 % cream Apply 1 application topically 2 (two) times daily.    Marland Kitchen ketoconazole (NIZORAL) 2 % cream Apply 1 application topically 2 (two) times daily.    Marland Kitchen losartan (COZAAR) 100 MG tablet TAKE 1 TABLET BY MOUTH EVERY DAY 90 tablet 0  . metoprolol succinate (TOPROL-XL) 50 MG 24 hr tablet Take 1 tablet (50 mg total) by mouth daily. 30 tablet 5  . mirabegron ER (MYRBETRIQ) 50 MG TB24 tablet Take 1 tablet (50 mg total) by mouth daily. 30 tablet 11  . omega-3 acid ethyl esters (LOVAZA) 1 g capsule Take 2 capsules (2 g total) by mouth 2 (two) times daily. 120 capsule 5  . omeprazole (PRILOSEC) 40 MG capsule Take 1 capsule (40 mg total) by mouth daily. 30 capsule 5  . OXYGEN Inhale 2 L into the lungs continuous.     Marland Kitchen tiotropium (SPIRIVA) 18 MCG inhalation capsule Place 18 mcg into inhaler and inhale daily.    Marland Kitchen tiZANidine (ZANAFLEX) 4 MG tablet TAKE 1 TABLET BY MOUTH EVERY 8 HOURS AS NEEDED 90 tablet 1  . triamcinolone cream (KENALOG) 0.1 % APP TOPICALLY AA BID  0  . venlafaxine XR (EFFEXOR-XR) 75 MG 24 hr capsule TAKE 3 CAPSULES(225 MG) BY MOUTH DAILY WITH BREAKFAST 270 capsule 0  . betamethasone dipropionate (DIPROLENE) 0.05 % cream Apply 1 application topically 2 (two) times daily as needed.    . hydrOXYzine (ATARAX/VISTARIL) 25 MG tablet TK 1 T PO QHS  1  . loratadine (CLARITIN) 10 MG tablet Take 1 tablet (10 mg total) by mouth daily. (Patient not taking: Reported on 05/15/2018) 30 tablet 5  . oxyCODONE (OXY IR/ROXICODONE) 5 MG immediate release tablet Take 1 tablet (5 mg total) by mouth 3 (three)  times daily as needed for severe pain. (Patient not taking: Reported on 05/15/2018) 90 tablet 0  . tiotropium (SPIRIVA) 18 MCG inhalation capsule Place 18 mcg into inhaler and inhale daily.      No current facility-administered medications on file prior to visit.     Allergies  Allergen Reactions  . Dapsone Other (See Comments)    Hypoxemia  . Sulfa Antibiotics Rash  . Sulfasalazine Rash     Review of Systems ROS Review of Systems - General ROS: negative for - chills, fatigue, fever, hot flashes, night sweats, weight gain or weight loss Psychological ROS: negative for - anxiety, decreased libido, depression, mood swings, physical abuse or sexual abuse Ophthalmic ROS: negative for - blurry vision, eye pain or loss of vision ENT ROS: negative for - headaches, hearing change, visual changes or vocal changes Allergy and Immunology ROS: negative for - hives, itchy/watery eyes or seasonal allergies Hematological and Lymphatic ROS: negative for - bleeding problems, bruising, swollen lymph nodes or weight loss Endocrine ROS: negative for - galactorrhea, hair pattern changes, hot flashes, malaise/lethargy, mood swings, palpitations, polydipsia/polyuria, skin changes, temperature intolerance or unexpected weight changes Breast ROS: negative for - new or changing breast lumps or nipple discharge Respiratory ROS: negative for - cough or shortness of breath Cardiovascular ROS: negative for - chest pain, irregular heartbeat, palpitations or shortness of breath Gastrointestinal ROS: no abdominal pain, change in bowel habits, or black or bloody stools Genito-Urinary ROS: no dysuria, trouble voiding, or hematuria.  Positive for urethral discharge or leaking urine.  Also vaginal discharge present. Musculoskeletal ROS: negative for - joint pain or joint stiffness Neurological ROS: negative for - bowel and bladder control changes Dermatological ROS: negative for rash and skin lesion changes   Objective:    BP (!) 149/40 (BP Location: Left Arm, Patient Position: Sitting)   Pulse 84   Ht 5' 4"  (1.626 m)   Wt 180 lb 6 oz (81.8 kg)   LMP 03/04/2002 (Approximate)   BMI 30.96 kg/m  CONSTITUTIONAL: Well-developed, well-nourished female in no acute distress.  [place] O2 use.  PSYCHIATRIC: Normal mood and affect. Normal behavior. Normal judgment and thought content. [place]: Alert and oriented to person, place, and time. Normal muscle tone coordination. No cranial nerve deficit noted. HENT:  Normocephalic, atraumatic, External right and left ear normal. Oropharynx is clear and moist EYES: Conjunctivae and EOM are normal. Pupils are equal, round, and reactive to light. No scleral icterus.  NECK: Normal range of motion, supple, no masses.  Normal thyroid.  SKIN: Skin is warm and dry. No rash noted. Not diaphoretic. No erythema. No pallor. CARDIOVASCULAR: Normal heart rate noted, regular rhythm, no murmur. RESPIRATORY: Clear to auscultation bilaterally. Effort and breath sounds normal, no problems with respiration noted. BREASTS: Symmetric in size. No masses, skin changes, nipple drainage, or lymphadenopathy. ABDOMEN: Soft, normal bowel sounds, no distention noted.  No tenderness, rebound or guarding.  BLADDER: Normal PELVIC:  Bladder no bladder distension noted  Urethra: small urethral caruncle, with no masses, tenderness. atrophic.  No discharge able to be expressed.   Vulva: normal appearing vulva with no masses, tenderness or lesions and radiation rash noted on vulva, perineum, and buttock.    Vagina: mildly atrophic and somewhat stenotic vagina due to h/o radiation.  No discharge present. Grade 1 bladder descensus. No rectocele.   Cervix: Cervix partially flushed to vaginal wall. No apparent lesions. Cervix partially palpable on bimanual exam.   Uterus: difficult to palpate due to body habitus, but does not appear to be enlarged.  Adnexa: normal adnexa in size, nontender and no masses  RV: Rectal  exam: external hemorrhoids noted.  Internal exam deferred at patient's request as was recently performed 2 weeks ago by Oncologist.   MUSCULOSKELETAL: Normal range of motion. No tenderness.  No cyanosis, clubbing, or edema.  2+ distal pulses. LYMPHATIC: No Axillary, Supraclavicular, or Inguinal Adenopathy.   Labs: Lab Results  Component Value Date   WBC 7.8 08/03/2017   HGB 12.8 08/03/2017   HCT 37.0 08/03/2017   MCV 88.3 08/03/2017   PLT 262 08/03/2017    Lab Results  Component Value Date   CREATININE 0.90 12/06/2017   BUN 10 12/06/2017   NA 140 12/06/2017   K 3.9 12/06/2017   CL 100 12/06/2017   CO2 30 12/06/2017    Lab Results  Component Value Date   ALT 35 (H) 12/06/2017   AST 39 (H) 12/06/2017   ALKPHOS 53 03/03/2017   BILITOT 0.5 12/06/2017    Lab Results  Component Value Date   CHOL 269 (H) 12/06/2017   HDL 35 (L) 12/06/2017   [place]  12/06/2017     Comment:     . LDL cholesterol not calculated. Triglyceride levels greater than 400 mg/dL invalidate calculated LDL results. . Reference range: <100 . Desirable range <100 mg/dL for primary prevention;   <70 mg/dL for patients with CHD or diabetic patients  with > or = 2 CHD risk factors. Marland Kitchen LDL-C is now calculated using  the Martin-Hopkins  calculation, which is a validated novel method providing  better accuracy than the Friedewald equation in the  estimation of LDL-C.  [name]. 9191;660(60): 2061-2068  (http://education.[employer].com/faq/FAQ164)    TRIG 778 (H) 12/06/2017   CHOLHDL 7.7 (H) 12/06/2017    Lab Results  Component Value Date   TSH 1.33 06/06/2017    Lab Results  Component Value Date   HGBA1C 6.1 (H) 08/29/2017     Assessment:   Well woman exam with routine gynecological exam Urethral discharge Benign hypertension Anal squamous cell carcinoma (HCC) Hypertriglyceridemia ASCUS HPV neg Status post radiation therapy Grade 1 cystocele  Plan:  Pap: Pap  Co Test.  Patient with h/o ASCUS HR HPV neg pap. Reiterated  with patient again that her HPV is negative and she could continue routine q 3 year screening, patient still desires repeat pap.  Mammogram: Up to date.  Stool Guaiac Testing:  Not Ordered.  Up to date with colon cancer screening. Continues routine f/u with Oncologist. Labs: has performed with PCP. Reviewed.  Routine preventative health maintenance measures emphasized: Exercise/Diet/Weight control and Stress Management HTN and Hyperlipidemia, managed by PCP.  Flu vaccine given today.  Urethral discharge. No current evidence of diverticulum, no discharge expressed on exam.  Is  possible that she may have a small vesico-vaginal fistula due to h/o radiation. Has been seen by Urology with no significant findings thus far.  Unsure if it is urine, as patient also with a cystocele.  Will prescribe estrogen cream for urethral and vaginal atrophy.  Grade 1 cystocele - discussed Kegel exercises, use of estrogen.  If no relief, can return to discuss use of pessary.    Return to [employer], or sooner as needed.    Rubie Maid, MD  [facility]

## 2018-05-15 NOTE — Progress Notes (Signed)
   PT is present today for her annual exam. Pt stated that she has been doing self-breast exams monthly.  Last mammogram 03/07/18. Pt stated that she is doing well and denies any issues. No problems or concerns.

## 2018-05-15 NOTE — Patient Instructions (Signed)
Health Maintenance for Postmenopausal Women Menopause is a normal process in which your reproductive ability comes to an end. This process happens gradually over a span of months to years, usually between the ages of 22 and 9. Menopause is complete when you have missed 12 consecutive menstrual periods. It is important to talk with your health care provider about some of the most common conditions that affect postmenopausal women, such as heart disease, cancer, and bone loss (osteoporosis). Adopting a healthy lifestyle and getting preventive care can help to promote your health and wellness. Those actions can also lower your chances of developing some of these common conditions. What should I know about menopause? During menopause, you may experience a number of symptoms, such as:  Moderate-to-severe hot flashes.  Night sweats.  Decrease in sex drive.  Mood swings.  Headaches.  Tiredness.  Irritability.  Memory problems.  Insomnia.  Choosing to treat or not to treat menopausal changes is an individual decision that you make with your health care provider. What should I know about hormone replacement therapy and supplements? Hormone therapy products are effective for treating symptoms that are associated with menopause, such as hot flashes and night sweats. Hormone replacement carries certain risks, especially as you become older. If you are thinking about using estrogen or estrogen with progestin treatments, discuss the benefits and risks with your health care provider. What should I know about heart disease and stroke? Heart disease, heart attack, and stroke become more likely as you age. This may be due, in part, to the hormonal changes that your body experiences during menopause. These can affect how your body processes dietary fats, triglycerides, and cholesterol. Heart attack and stroke are both medical emergencies. There are many things that you can do to help prevent heart disease  and stroke:  Have your blood pressure checked at least every 1-2 years. High blood pressure causes heart disease and increases the risk of stroke.  If you are 53-22 years old, ask your health care provider if you should take aspirin to prevent a heart attack or a stroke.  Do not use any tobacco products, including cigarettes, chewing tobacco, or electronic cigarettes. If you need help quitting, ask your health care provider.  It is important to eat a healthy diet and maintain a healthy weight. ? Be sure to include plenty of vegetables, fruits, low-fat dairy products, and lean protein. ? Avoid eating foods that are high in solid fats, added sugars, or salt (sodium).  Get regular exercise. This is one of the most important things that you can do for your health. ? Try to exercise for at least 150 minutes each week. The type of exercise that you do should increase your heart rate and make you sweat. This is known as moderate-intensity exercise. ? Try to do strengthening exercises at least twice each week. Do these in addition to the moderate-intensity exercise.  Know your numbers.Ask your health care provider to check your cholesterol and your blood glucose. Continue to have your blood tested as directed by your health care provider.  What should I know about cancer screening? There are several types of cancer. Take the following steps to reduce your risk and to catch any cancer development as early as possible. Breast Cancer  Practice breast self-awareness. ? This means understanding how your breasts normally appear and feel. ? It also means doing regular breast self-exams. Let your health care provider know about any changes, no matter how small.  If you are 40  or older, have a clinician do a breast exam (clinical breast exam or CBE) every year. Depending on your age, family history, and medical history, it may be recommended that you also have a yearly breast X-ray (mammogram).  If you  have a family history of breast cancer, talk with your health care provider about genetic screening.  If you are at high risk for breast cancer, talk with your health care provider about having an MRI and a mammogram every year.  Breast cancer (BRCA) gene test is recommended for women who have family members with BRCA-related cancers. Results of the assessment will determine the need for genetic counseling and BRCA1 and for BRCA2 testing. BRCA-related cancers include these types: ? Breast. This occurs in males or females. ? Ovarian. ? Tubal. This may also be called fallopian tube cancer. ? Cancer of the abdominal or pelvic lining (peritoneal cancer). ? Prostate. ? Pancreatic.  Cervical, Uterine, and Ovarian Cancer Your health care provider may recommend that you be screened regularly for cancer of the pelvic organs. These include your ovaries, uterus, and vagina. This screening involves a pelvic exam, which includes checking for microscopic changes to the surface of your cervix (Pap test).  For women ages 21-65, health care providers may recommend a pelvic exam and a Pap test every three years. For women ages 79-65, they may recommend the Pap test and pelvic exam, combined with testing for human papilloma virus (HPV), every five years. Some types of HPV increase your risk of cervical cancer. Testing for HPV may also be done on women of any age who have unclear Pap test results.  Other health care providers may not recommend any screening for nonpregnant women who are considered low risk for pelvic cancer and have no symptoms. Ask your health care provider if a screening pelvic exam is right for you.  If you have had past treatment for cervical cancer or a condition that could lead to cancer, you need Pap tests and screening for cancer for at least 20 years after your treatment. If Pap tests have been discontinued for you, your risk factors (such as having a new sexual partner) need to be  reassessed to determine if you should start having screenings again. Some women have medical problems that increase the chance of getting cervical cancer. In these cases, your health care provider may recommend that you have screening and Pap tests more often.  If you have a family history of uterine cancer or ovarian cancer, talk with your health care provider about genetic screening.  If you have vaginal bleeding after reaching menopause, tell your health care provider.  There are currently no reliable tests available to screen for ovarian cancer.  Lung Cancer Lung cancer screening is recommended for adults 69-62 years old who are at high risk for lung cancer because of a history of smoking. A yearly low-dose CT scan of the lungs is recommended if you:  Currently smoke.  Have a history of at least 30 pack-years of smoking and you currently smoke or have quit within the past 15 years. A pack-year is smoking an average of one pack of cigarettes per day for one year.  Yearly screening should:  Continue until it has been 15 years since you quit.  Stop if you develop a health problem that would prevent you from having lung cancer treatment.  Colorectal Cancer  This type of cancer can be detected and can often be prevented.  Routine colorectal cancer screening usually begins at  age 42 and continues through age 45.  If you have risk factors for colon cancer, your health care provider may recommend that you be screened at an earlier age.  If you have a family history of colorectal cancer, talk with your health care provider about genetic screening.  Your health care provider may also recommend using home test kits to check for hidden blood in your stool.  A small camera at the end of a tube can be used to examine your colon directly (sigmoidoscopy or colonoscopy). This is done to check for the earliest forms of colorectal cancer.  Direct examination of the colon should be repeated every  5-10 years until age 71. However, if early forms of precancerous polyps or small growths are found or if you have a family history or genetic risk for colorectal cancer, you may need to be screened more often.  Skin Cancer  Check your skin from head to toe regularly.  Monitor any moles. Be sure to tell your health care provider: ? About any new moles or changes in moles, especially if there is a change in a mole's shape or color. ? If you have a mole that is larger than the size of a pencil eraser.  If any of your family members has a history of skin cancer, especially at a young age, talk with your health care provider about genetic screening.  Always use sunscreen. Apply sunscreen liberally and repeatedly throughout the day.  Whenever you are outside, protect yourself by wearing long sleeves, pants, a wide-brimmed hat, and sunglasses.  What should I know about osteoporosis? Osteoporosis is a condition in which bone destruction happens more quickly than new bone creation. After menopause, you may be at an increased risk for osteoporosis. To help prevent osteoporosis or the bone fractures that can happen because of osteoporosis, the following is recommended:  If you are 46-71 years old, get at least 1,000 mg of calcium and at least 600 mg of vitamin D per day.  If you are older than age 55 but younger than age 65, get at least 1,200 mg of calcium and at least 600 mg of vitamin D per day.  If you are older than age 54, get at least 1,200 mg of calcium and at least 800 mg of vitamin D per day.  Smoking and excessive alcohol intake increase the risk of osteoporosis. Eat foods that are rich in calcium and vitamin D, and do weight-bearing exercises several times each week as directed by your health care provider. What should I know about how menopause affects my mental health? Depression may occur at any age, but it is more common as you become older. Common symptoms of depression  include:  Low or sad mood.  Changes in sleep patterns.  Changes in appetite or eating patterns.  Feeling an overall lack of motivation or enjoyment of activities that you previously enjoyed.  Frequent crying spells.  Talk with your health care provider if you think that you are experiencing depression. What should I know about immunizations? It is important that you get and maintain your immunizations. These include:  Tetanus, diphtheria, and pertussis (Tdap) booster vaccine.  Influenza every year before the flu season begins.  Pneumonia vaccine.  Shingles vaccine.  Your health care provider may also recommend other immunizations. This information is not intended to replace advice given to you by your health care provider. Make sure you discuss any questions you have with your health care provider. Document Released: 09/17/2005  Document Revised: 02/13/2016 Document Reviewed: 04/29/2015 Elsevier Interactive Patient Education  2018 [employer]. Kegel Exercises Kegel exercises help strengthen the muscles that support the rectum, vagina, small intestine, bladder, and uterus. Doing Kegel exercises can help:  Improve bladder and bowel control.  Improve sexual response.  Reduce problems and discomfort during pregnancy.  Kegel exercises involve squeezing your pelvic floor muscles, which are the same muscles you squeeze when you try to stop the flow of urine. The exercises can be done while sitting, standing, or lying down, but it is best to vary your position. Phase 1 exercises 1. Squeeze your pelvic floor muscles tight. You should feel a tight lift in your rectal area. If you are a female, you should also feel a tightness in your vaginal area. Keep your stomach, buttocks, and legs relaxed. 2. Hold the muscles tight for up to 10 seconds. 3. Relax your muscles. Repeat this exercise 50 times a day or as many times as told by your health care provider. Continue to do this exercise  for at least 4-6 weeks or for as long as told by your health care provider. This information is not intended to replace advice given to you by your health care provider. Make sure you discuss any questions you have with your health care provider. Document Released: 07/12/2012 Document Revised: 03/20/2016 Document Reviewed: 06/15/2015 Elsevier Interactive Patient Education  [employer].

## 2018-05-16 ENCOUNTER — Encounter: Payer: Self-pay | Admitting: Student in an Organized Health Care Education/Training Program

## 2018-05-16 ENCOUNTER — Other Ambulatory Visit: Payer: Self-pay

## 2018-05-16 ENCOUNTER — Ambulatory Visit
Payer: BLUE CROSS/BLUE SHIELD | Attending: Student in an Organized Health Care Education/Training Program | Admitting: Student in an Organized Health Care Education/Training Program

## 2018-05-16 VITALS — BP 117/83 | HR 77 | Temp 98.6°F | Resp 18 | Ht 64.0 in | Wt 180.0 lb

## 2018-05-16 DIAGNOSIS — K219 Gastro-esophageal reflux disease without esophagitis: Secondary | ICD-10-CM | POA: Insufficient documentation

## 2018-05-16 DIAGNOSIS — L409 Psoriasis, unspecified: Secondary | ICD-10-CM | POA: Diagnosis not present

## 2018-05-16 DIAGNOSIS — I10 Essential (primary) hypertension: Secondary | ICD-10-CM | POA: Diagnosis not present

## 2018-05-16 DIAGNOSIS — M542 Cervicalgia: Secondary | ICD-10-CM | POA: Diagnosis not present

## 2018-05-16 DIAGNOSIS — R87619 Unspecified abnormal cytological findings in specimens from cervix uteri: Secondary | ICD-10-CM | POA: Diagnosis not present

## 2018-05-16 DIAGNOSIS — K658 Other peritonitis: Secondary | ICD-10-CM | POA: Diagnosis not present

## 2018-05-16 DIAGNOSIS — R000 Tachycardia, unspecified: Secondary | ICD-10-CM | POA: Diagnosis not present

## 2018-05-16 DIAGNOSIS — M7918 Myalgia, other site: Secondary | ICD-10-CM | POA: Insufficient documentation

## 2018-05-16 DIAGNOSIS — Z5181 Encounter for therapeutic drug level monitoring: Secondary | ICD-10-CM | POA: Insufficient documentation

## 2018-05-16 DIAGNOSIS — F339 Major depressive disorder, recurrent, unspecified: Secondary | ICD-10-CM | POA: Insufficient documentation

## 2018-05-16 DIAGNOSIS — M47812 Spondylosis without myelopathy or radiculopathy, cervical region: Secondary | ICD-10-CM

## 2018-05-16 DIAGNOSIS — C7951 Secondary malignant neoplasm of bone: Secondary | ICD-10-CM | POA: Insufficient documentation

## 2018-05-16 DIAGNOSIS — K760 Fatty (change of) liver, not elsewhere classified: Secondary | ICD-10-CM | POA: Diagnosis not present

## 2018-05-16 DIAGNOSIS — R945 Abnormal results of liver function studies: Secondary | ICD-10-CM | POA: Diagnosis not present

## 2018-05-16 DIAGNOSIS — G4733 Obstructive sleep apnea (adult) (pediatric): Secondary | ICD-10-CM | POA: Insufficient documentation

## 2018-05-16 DIAGNOSIS — R600 Localized edema: Secondary | ICD-10-CM | POA: Insufficient documentation

## 2018-05-16 DIAGNOSIS — E781 Pure hyperglyceridemia: Secondary | ICD-10-CM | POA: Insufficient documentation

## 2018-05-16 DIAGNOSIS — Z981 Arthrodesis status: Secondary | ICD-10-CM | POA: Insufficient documentation

## 2018-05-16 DIAGNOSIS — G8929 Other chronic pain: Secondary | ICD-10-CM

## 2018-05-16 DIAGNOSIS — J432 Centrilobular emphysema: Secondary | ICD-10-CM | POA: Diagnosis not present

## 2018-05-16 DIAGNOSIS — F411 Generalized anxiety disorder: Secondary | ICD-10-CM | POA: Insufficient documentation

## 2018-05-16 DIAGNOSIS — J309 Allergic rhinitis, unspecified: Secondary | ICD-10-CM | POA: Insufficient documentation

## 2018-05-16 DIAGNOSIS — M25512 Pain in left shoulder: Secondary | ICD-10-CM | POA: Insufficient documentation

## 2018-05-16 DIAGNOSIS — G894 Chronic pain syndrome: Secondary | ICD-10-CM | POA: Diagnosis not present

## 2018-05-16 DIAGNOSIS — M4722 Other spondylosis with radiculopathy, cervical region: Secondary | ICD-10-CM

## 2018-05-16 DIAGNOSIS — Z9981 Dependence on supplemental oxygen: Secondary | ICD-10-CM | POA: Diagnosis not present

## 2018-05-16 DIAGNOSIS — K5909 Other constipation: Secondary | ICD-10-CM | POA: Diagnosis not present

## 2018-05-16 MED ORDER — HYDROCODONE-ACETAMINOPHEN 10-325 MG PO TABS: 1.0000 | ORAL_TABLET | Freq: Three times a day (TID) | ORAL | 0 refills | Status: AC | PRN

## 2018-05-16 MED ORDER — HYDROCODONE-ACETAMINOPHEN 10-325 MG PO TABS: 1.0000 | ORAL_TABLET | Freq: Three times a day (TID) | ORAL | 0 refills | Status: DC | PRN

## 2018-05-16 MED ORDER — TIZANIDINE HCL 4 MG PO TABS: 4.0000 mg | ORAL_TABLET | Freq: Three times a day (TID) | ORAL | 2 refills | Status: DC | PRN

## 2018-05-16 NOTE — Patient Instructions (Signed)
You were given 3 prescriptions for Hydrocodone and one for Tizanidine.

## 2018-05-16 NOTE — Progress Notes (Signed)
Nursing Pain Medication Assessment:  Safety precautions to be maintained throughout the outpatient stay will include: orient to surroundings, keep bed in low position, maintain call bell within reach at all times, provide assistance with transfer out of bed and ambulation.  Medication Inspection Compliance: Pill count conducted under aseptic conditions, in front of the patient. Neither the pills nor the bottle was removed from the patient's sight at any time. Once count was completed pills were immediately returned to the patient in their original bottle.  Medication: Hydrocodone/APAP Pill/Patch Count: 50 of 90 pills remain Pill/Patch Appearance: Markings consistent with prescribed medication Bottle Appearance: Standard pharmacy container. Clearly labeled. Filled Date: 37 / 24 / 2019 Last Medication intake:  Today

## 2018-05-16 NOTE — Progress Notes (Signed)
Patient's Name: Judy Allen  MRN: [id number]  Referring Provider: Steele Sizer, MD  DOB: 1956/04/12  PCP: Steele Sizer, MD  DOS: 05/16/2018  Note by: Gillis Santa, MD  Service setting: Ambulatory outpatient  Specialty: Interventional Pain Management  Location: [facility] (AMB) Pain Management Facility    Patient type: Established   Primary Reason(s) for Visit: Encounter for prescription drug management. (Level of risk: moderate)  CC: Shoulder Pain (left) and Neck Pain  HPI  [name] is a 62 y.o. year old, female patient, who comes today for a medication management evaluation. She has Tachycardia, paroxysmal ([place]); Benign hypertension; Chronic cervical pain; CN (constipation); Gastric reflux; Hypertriglyceridemia; Edema leg; Chronic recurrent major depressive disorder ([place]); Dysmetabolic syndrome; Obstructive apnea; Psoriasis; Allergic rhinitis; Bursitis, trochanteric; GAD (generalized anxiety disorder); History of fusion of cervical spine; Chronic constipation; Non-thrombocytopenic purpura ([place]); Metastatic squamous cell carcinoma ([place]); Anal squamous cell carcinoma ([place]); Abnormal Pap smear of cervix; Centrilobular emphysema ([place]); Chronic respiratory failure with hypoxia, on home oxygen therapy ([place]); Fatty liver; Perihepatitis ([place]); Abnormal liver function test; Myofascial pain syndrome, cervical; Chronic pain syndrome; and Cervical facet joint syndrome on their problem list. Her primarily concern today is the Shoulder Pain (left) and Neck Pain  Pain Assessment: Location: Left Shoulder Radiating: down to fingertips on left side Onset: More than a month ago Duration: Chronic pain Quality: Constant, Burning, Shooting Severity: 5 /10 (subjective, self-reported pain score)  Effect on ADL:  Limits ability to perform ADLs Timing: Constant Modifying factors: meds, trigger point injections BP: 117/83  HR: 77  [name] was last scheduled for an appointment on 03/27/2018 for medication  management. During today's appointment we reviewed [name]'s chronic pain status, as well as her outpatient medication regimen.  The patient  reports that she does not use drugs. Her body mass index is 30.9 kg/m.  Further details on both, my assessment(s), as well as the proposed treatment plan, please see below.  Controlled Substance Pharmacotherapy Assessment REMS (Risk Evaluation and Mitigation Strategy)  Analgesic: Hydrocodone 7.5 mg 3 times daily as needed, quantity 90/month MME/day: 22.5 mg/day.  Rise Patience, RN  05/16/2018 12:26 PM  Sign at close encounter Nursing Pain Medication Assessment:  Safety precautions to be maintained throughout the outpatient stay will include: orient to surroundings, keep bed in low position, maintain call bell within reach at all times, provide assistance with transfer out of bed and ambulation.  Medication Inspection Compliance: Pill count conducted under aseptic conditions, in front of the patient. Neither the pills nor the bottle was removed from the patient's sight at any time. Once count was completed pills were immediately returned to the patient in their original bottle.  Medication: Hydrocodone/APAP Pill/Patch Count: 50 of 90 pills remain Pill/Patch Appearance: Markings consistent with prescribed medication Bottle Appearance: Standard pharmacy container. Clearly labeled. Filled Date: 25 / 24 / 2019 Last Medication intake:  Today   Pharmacokinetics: Liberation and absorption (onset of action): WNL Distribution (time to peak effect): WNL Metabolism and excretion (duration of action): WNL         Pharmacodynamics: Desired effects: Analgesia: [name] reports 50% benefit. Functional ability: Patient reports that medication allows her to accomplish basic ADLs Clinically meaningful improvement in function (CMIF): Sustained CMIF goals met Perceived effectiveness: Described as relatively effective but with some room for improvement Undesirable  effects: Side-effects or Adverse reactions: None reported Monitoring: [place] PMP: Online review of the past 70-monthperiod conducted. Compliant with practice rules and regulations Last UDS on record: Summary  Date  Value Ref Range Status  09/07/2017 FINAL  Final    Comment:    ==================================================================== TOXASSURE COMP DRUG ANALYSIS,UR ==================================================================== Test                             Result       Flag       Units Drug Present and Declared for Prescription Verification   Norhydrocodone                 189          EXPECTED   ng/mg creat    Norhydrocodone is an expected metabolite of hydrocodone.   Gabapentin                     PRESENT      EXPECTED   Venlafaxine                    PRESENT      EXPECTED   Desmethylvenlafaxine           PRESENT      EXPECTED    Desmethylvenlafaxine is an expected metabolite of venlafaxine.   Acetaminophen                  PRESENT      EXPECTED   Metoprolol                     PRESENT      EXPECTED Drug Absent but Declared for Prescription Verification   Hydrocodone                    Not Detected UNEXPECTED ng/mg creat    Hydrocodone is almost always present in patients taking this drug    consistently. Absence of hydrocodone could be due to lapse of    time since the last dose or unusual pharmacokinetics (rapid    metabolism).   Tizanidine                     Not Detected UNEXPECTED    Tizanidine, as indicated in the declared medication list, is not    always detected even when used as directed. ==================================================================== Test                      Result    Flag   Units      Ref Range   Creatinine              35               mg/dL      >=20 ==================================================================== Declared Medications:  The flagging and interpretation on this report are based on the  following declared  medications.  Unexpected results may arise from  inaccuracies in the declared medications.  **Note: The testing scope of this panel includes these medications:  Gabapentin  Hydrocodone (Hydrocodone-Acetaminophen)  Metoprolol  Venlafaxine  **Note: The testing scope of this panel does not include small to  moderate amounts of these reported medications:  Acetaminophen (Hydrocodone-Acetaminophen)  Tizanidine (Zanaflex)  **Note: The testing scope of this panel does not include following  reported medications:  Albuterol  Betamethasone (Diprolene)  Cyanocobalamin  Divalproex (Depakote)  Hydrochlorothiazide  Hydrocortisone  Ketoconazole (Nizoral)  Omeprazole (Prilosec)  Oxybutynin  Oxygen  Supplement (Omega-3)  Tiotropium (Spiriva)  Tolterodine (Detrol LA) ==================================================================== For clinical consultation, please call [phone]. ====================================================================  UDS interpretation: Compliant          Medication Assessment Form: Reviewed. Patient indicates being compliant with therapy Treatment compliance: Compliant Risk Assessment Profile: Aberrant behavior: See prior evaluations. None observed or detected today Comorbid factors increasing risk of overdose: See prior notes. No additional risks detected today Opioid risk tool (ORT) (Total Score): 1 Personal History of Substance Abuse (SUD-Substance use disorder):  Alcohol: Negative  Illegal Drugs: Negative  Rx Drugs: Negative  ORT Risk Level calculation: Low Risk Risk of substance use disorder (SUD): Low Opioid Risk Tool - 05/16/18 1222      Family History of Substance Abuse   Alcohol  Negative    Illegal Drugs  Negative    Rx Drugs  Negative      Personal History of Substance Abuse   Alcohol  Negative    Illegal Drugs  Negative    Rx Drugs  Negative      Age   Age between 28-45 years   No      History of Preadolescent Sexual  Abuse   History of Preadolescent Sexual Abuse  Negative or Female      Psychological Disease   Psychological Disease  Negative    Depression  Positive      Total Score   Opioid Risk Tool Scoring  1    Opioid Risk Interpretation  Low Risk      ORT Scoring interpretation table:  Score <3 = Low Risk for SUD  Score between 4-7 = Moderate Risk for SUD  Score >8 = High Risk for Opioid Abuse   Risk Mitigation Strategies:  Patient Counseling: Covered Patient-Prescriber Agreement (PPA): Present and active  Notification to other healthcare providers: Done  Pharmacologic Plan: Status post cervical facet blocks as well as cervical trigger point injections.  Patient continuing to have left cervical pain that radiates to left trapezius.  Discussed increasing hydrocodone to 10 mg 3 times daily as needed from 7.5 mill grams 3 times daily as needed             Laboratory Chemistry  Inflammation Markers (CRP: Acute Phase) (ESR: Chronic Phase) No results found for: CRP, ESRSEDRATE, LATICACIDVEN                       Rheumatology Markers No results found for: RF, ANA, LABURIC, URICUR, LYMEIGGIGMAB, LYMEABIGMQN, HLAB27                      Renal Function Markers Lab Results  Component Value Date   BUN 10 12/06/2017   CREATININE 0.90 51/70/0174   BCR NOT APPLICABLE 94/49/6759   GFRAA 79 12/06/2017   GFRNONAA 69 12/06/2017                             Hepatic Function Markers Lab Results  Component Value Date   AST 39 (H) 12/06/2017   ALT 35 (H) 12/06/2017   ALBUMIN 4.3 03/03/2017   ALKPHOS 53 03/03/2017   LIPASE 27 08/03/2017                        Electrolytes Lab Results  Component Value Date   NA 140 12/06/2017   K 3.9 12/06/2017   CL 100 12/06/2017   CALCIUM 9.8 12/06/2017  Neuropathy Markers Lab Results  Component Value Date   HGBA1C 6.1 (H) 08/29/2017                        CNS Tests No results found for: COLORCSF, APPEARCSF, RBCCOUNTCSF, WBCCSF,  POLYSCSF, LYMPHSCSF, EOSCSF, PROTEINCSF, GLUCCSF, JCVIRUS, CSFOLI, IGGCSF                      Bone Pathology Markers No results found for: VD25OH, [id number], [id number], [id number], 25OHVITD1, 25OHVITD2, 25OHVITD3, TESTOFREE, TESTOSTERONE                       Coagulation Parameters Lab Results  Component Value Date   INR 1.00 09/12/2017   LABPROT 13.1 09/12/2017   APTT 30 09/12/2017   PLT 262 08/03/2017                        Cardiovascular Markers Lab Results  Component Value Date   CKTOTAL 128 06/08/2012   CKMB 2.2 06/08/2012   TROPONINI < 0.02 06/08/2012   HGB 12.8 08/03/2017   HCT 37.0 08/03/2017                         CA Markers No results found for: CEA, CA125, LABCA2                      Note: Lab results reviewed.  Recent Diagnostic Imaging Results  MM 3D SCREEN BREAST BILATERAL CLINICAL DATA:  Screening.  EXAM: DIGITAL SCREENING BILATERAL MAMMOGRAM WITH TOMO AND CAD  COMPARISON:  Previous exam(s).  ACR Breast Density Category b: There are scattered areas of fibroglandular density.  FINDINGS: There are no findings suspicious for malignancy. Images were processed with CAD.  IMPRESSION: No mammographic evidence of malignancy. A result letter of this screening mammogram will be mailed directly to the patient.  RECOMMENDATION: Screening mammogram in one year. (Code:[id number])  BI-RADS CATEGORY  1: Negative.  Electronically Signed   By: Everlean Alstrom M.D.   On: 03/07/2018 15:28  Complexity Note: Imaging results reviewed. Results shared with Ms. Reveron, using Layman's terms.                         Meds   Current Outpatient Medications:  .  albuterol (PROVENTIL HFA;VENTOLIN HFA) 108 (90 Base) MCG/ACT inhaler, Inhale 2 puffs into the lungs every 6 (six) hours as needed for wheezing or shortness of breath., Disp: , Rfl:  .  betamethasone dipropionate (DIPROLENE) 0.05 % cream, Apply 1 application topically 2 (two) times daily as needed., Disp: ,  Rfl:  .  conjugated estrogens (PREMARIN) vaginal cream, Place 0.5 Applicatorfuls vaginally every 3 (three) days. Also apply externally to urethra every 3 days, Disp: 42.5 g, Rfl: 3 .  divalproex (DEPAKOTE) 250 MG DR tablet, TAKE 1 TABLET BY MOUTH EVERY NIGHT AT BEDTIME, Disp: 30 tablet, Rfl: 5 .  fluticasone (FLONASE) 50 MCG/ACT nasal spray, Place 2 sprays into the nose daily., Disp: , Rfl:  .  gabapentin (NEURONTIN) 300 MG capsule, Take 1 capsule (300 mg total) by mouth 3 (three) times daily., Disp: 90 capsule, Rfl: 3 .  gemfibrozil (LOPID) 600 MG tablet, Take 1 tablet by mouth 2 (two) times daily., Disp: , Rfl:  .  hydrocortisone 2.5 % cream, Apply 1 application topically 2 (two) times daily., Disp: , Rfl:  .  hydrOXYzine (  ATARAX/VISTARIL) 25 MG tablet, TK 1 T PO QHS, Disp: , Rfl: 1 .  ketoconazole (NIZORAL) 2 % cream, Apply 1 application topically 2 (two) times daily., Disp: , Rfl:  .  losartan (COZAAR) 100 MG tablet, TAKE 1 TABLET BY MOUTH EVERY DAY, Disp: 90 tablet, Rfl: 0 .  metoprolol succinate (TOPROL-XL) 50 MG 24 hr tablet, Take 1 tablet (50 mg total) by mouth daily., Disp: 30 tablet, Rfl: 5 .  mirabegron ER (MYRBETRIQ) 50 MG TB24 tablet, Take 1 tablet (50 mg total) by mouth daily., Disp: 30 tablet, Rfl: 11 .  omega-3 acid ethyl esters (LOVAZA) 1 g capsule, Take 2 capsules (2 g total) by mouth 2 (two) times daily., Disp: 120 capsule, Rfl: 5 .  omeprazole (PRILOSEC) 40 MG capsule, Take 1 capsule (40 mg total) by mouth daily., Disp: 30 capsule, Rfl: 5 .  OXYGEN, Inhale 2 L into the lungs continuous. , Disp: , Rfl:  .  tiotropium (SPIRIVA) 18 MCG inhalation capsule, Place 18 mcg into inhaler and inhale daily., Disp: , Rfl:  .  tiZANidine (ZANAFLEX) 4 MG tablet, Take 1 tablet (4 mg total) by mouth every 8 (eight) hours as needed., Disp: 90 tablet, Rfl: 2 .  triamcinolone cream (KENALOG) 0.1 %, APP TOPICALLY AA BID, Disp: , Rfl: 0 .  venlafaxine XR (EFFEXOR-XR) 75 MG 24 hr capsule, TAKE 3  CAPSULES(225 MG) BY MOUTH DAILY WITH BREAKFAST, Disp: 270 capsule, Rfl: 0 .  [START ON 05/31/2018] HYDROcodone-acetaminophen (NORCO) 10-325 MG tablet, Take 1 tablet by mouth 3 (three) times daily as needed for severe pain., Disp: 90 tablet, Rfl: 0 .  [START ON 06/30/2018] HYDROcodone-acetaminophen (NORCO) 10-325 MG tablet, Take 1 tablet by mouth 3 (three) times daily as needed for severe pain., Disp: 90 tablet, Rfl: 0 .  [START ON 07/30/2018] HYDROcodone-acetaminophen (NORCO) 10-325 MG tablet, Take 1 tablet by mouth 3 (three) times daily as needed for severe pain., Disp: 90 tablet, Rfl: 0 .  tiotropium (SPIRIVA) 18 MCG inhalation capsule, Place 18 mcg into inhaler and inhale daily. , Disp: , Rfl:   ROS  Constitutional: Denies any fever or chills Gastrointestinal: No reported hemesis, hematochezia, vomiting, or acute GI distress Musculoskeletal: Denies any acute onset joint swelling, redness, loss of ROM, or weakness Neurological: No reported episodes of acute onset apraxia, aphasia, dysarthria, agnosia, amnesia, paralysis, loss of coordination, or loss of consciousness  Allergies  Ms. Nobis is allergic to dapsone; sulfa antibiotics; and sulfasalazine.  PFSH  Drug: Ms. Koebel  reports that she does not use drugs. Alcohol:  reports that she does not drink alcohol. Tobacco:  reports that she quit smoking about 12 years ago. Her smoking use included cigarettes. She started smoking about 54 years ago. She has a 32.00 pack-year smoking history. She has never used smokeless tobacco. Medical:  has a past medical history of Anemia, Anxiety, Arthritis, Colon cancer ([place]) (4287), Complication of anesthesia, Constipation, COPD (chronic obstructive pulmonary disease) ([place]), Depression, Diabetes mellitus without complication ([place]), Dysrhythmia, GERD (gastroesophageal reflux disease), H/O allergic rhinitis, Heart murmur, Hemorrhoid, Hyperlipidemia, Hypertension, Neck pain, chronic, Neuritis, Ovarian failure,  Personal history of chemotherapy, Personal history of radiation therapy, PONV (postoperative nausea and vomiting), Pre-diabetes (2019), Proteinuria, Psoriasis, Sleep apnea (09/12/2016), Tachycardia, paroxysmal ([place]), and Urinary incontinence. Surgical: Ms. Haub  has a past surgical history that includes Tubal ligation; tonsillectomy (1959); herniated disc repair (2001); Anterior fusion lumbar spine (1990); Neck surgery; Mass excision (Left, 02/23/2016); Colonoscopy (2018); Portacath placement (Left, 03/12/2016); Cardiac catheterization (2009? ); Cardiac catheterization (05/2012);  Port-a-cath removal (2018); Liver biopsy (N/A, 09/12/2017); and Cholecystectomy (N/A, 09/12/2017). Family: family history includes Asthma in her mother; Breast cancer in her maternal aunt; Heart attack in her mother; Liver cancer in her father.  Constitutional Exam  General appearance: Well nourished, well developed, and well hydrated. In no apparent acute distress Vitals:   05/16/18 1212  BP: 117/83  Pulse: 77  Resp: 18  Temp: 98.6 F (37 C)  TempSrc: Oral  SpO2: 92%  Weight: 180 lb (81.6 kg)  Height: '5\' 4"'  (1.626 m)   BMI Assessment: Estimated body mass index is 30.9 kg/m as calculated from the following:   Height as of this encounter: '5\' 4"'  (1.626 m).   Weight as of this encounter: 180 lb (81.6 kg).  BMI interpretation table: BMI level Category Range association with higher incidence of chronic pain  <18 kg/m2 Underweight   18.5-24.9 kg/m2 Ideal body weight   25-29.9 kg/m2 Overweight Increased incidence by 20%  30-34.9 kg/m2 Obese (Class I) Increased incidence by 68%  35-39.9 kg/m2 Severe obesity (Class II) Increased incidence by 136%  >40 kg/m2 Extreme obesity (Class III) Increased incidence by 254%   Patient's current BMI Ideal Body weight  Body mass index is 30.9 kg/m. Ideal body weight: 54.7 kg (120 lb 9.5 oz) Adjusted ideal body weight: 65.5 kg (144 lb 5.7 oz)   BMI Readings from Last 4 Encounters:   05/16/18 30.90 kg/m  05/15/18 30.96 kg/m  02/24/18 31.62 kg/m  02/20/18 30.90 kg/m   Wt Readings from Last 4 Encounters:  05/16/18 180 lb (81.6 kg)  05/15/18 180 lb 6 oz (81.8 kg)  02/24/18 184 lb 3.2 oz (83.6 kg)  02/20/18 180 lb (81.6 kg)  Psych/Mental status: Alert, oriented x 3 (person, place, & time)       Eyes: PERLA Respiratory: Oxygen-dependent COPD  Cervical Spine Area Exam  Skin & Axial Inspection: No masses, redness, edema, swelling, or associated skin lesions Alignment: Symmetrical Functional ROM: Decreased ROM, to the left Stability: No instability detected Muscle Tone/Strength: Functionally intact. No obvious neuro-muscular anomalies detected. Sensory (Neurological): Musculoskeletal pain pattern Palpation: Complains of area being tender to palpation              Upper Extremity (UE) Exam    Side: Right upper extremity  Side: Left upper extremity  Skin & Extremity Inspection: Skin color, temperature, and hair growth are WNL. No peripheral edema or cyanosis. No masses, redness, swelling, asymmetry, or associated skin lesions. No contractures.  Skin & Extremity Inspection: Skin color, temperature, and hair growth are WNL. No peripheral edema or cyanosis. No masses, redness, swelling, asymmetry, or associated skin lesions. No contractures.  Functional ROM: Unrestricted ROM          Functional ROM: Decreased ROM for shoulder and elbow  Muscle Tone/Strength: Functionally intact. No obvious neuro-muscular anomalies detected.  Muscle Tone/Strength: Functionally intact. No obvious neuro-muscular anomalies detected.  Sensory (Neurological): Unimpaired          Sensory (Neurological): Dermatomal pain pattern          Palpation: No palpable anomalies              Palpation: No palpable anomalies              Provocative Test(s):  Phalen's test: deferred Tinel's test: deferred Apley's scratch test (touch opposite shoulder):  Action 1 (Across chest): deferred Action 2  (Overhead): deferred Action 3 (LB reach): deferred   Provocative Test(s):  Phalen's test: deferred Tinel's test: deferred Apley's  scratch test (touch opposite shoulder):  Action 1 (Across chest): Decreased ROM Action 2 (Overhead): Decreased ROM Action 3 (LB reach): Decreased ROM    Thoracic Spine Area Exam  Skin & Axial Inspection: No masses, redness, or swelling Alignment: Symmetrical Functional ROM: Unrestricted ROM Stability: No instability detected Muscle Tone/Strength: Functionally intact. No obvious neuro-muscular anomalies detected. Sensory (Neurological): Unimpaired Muscle strength & Tone: No palpable anomalies  Lumbar Spine Area Exam  Skin & Axial Inspection: No masses, redness, or swelling Alignment: Symmetrical Functional ROM: Unrestricted ROM       Stability: No instability detected Muscle Tone/Strength: Functionally intact. No obvious neuro-muscular anomalies detected. Sensory (Neurological): Unimpaired Palpation: No palpable anomalies       Provocative Tests: Hyperextension/rotation test: deferred today       Lumbar quadrant test (Kemp's test): deferred today       Lateral bending test: deferred today       Patrick's Maneuver: deferred today                   FABER test: deferred today                   S-I anterior distraction/compression test: deferred today         S-I lateral compression test: deferred today         S-I Thigh-thrust test: deferred today         S-I Gaenslen's test: deferred today          Gait & Posture Assessment  Ambulation: Unassisted Gait: Relatively normal for age and body habitus Posture: WNL   Lower Extremity Exam    Side: Right lower extremity  Side: Left lower extremity  Stability: No instability observed          Stability: No instability observed          Skin & Extremity Inspection: Skin color, temperature, and hair growth are WNL. No peripheral edema or cyanosis. No masses, redness, swelling, asymmetry, or associated skin  lesions. No contractures.  Skin & Extremity Inspection: Skin color, temperature, and hair growth are WNL. No peripheral edema or cyanosis. No masses, redness, swelling, asymmetry, or associated skin lesions. No contractures.  Functional ROM: Unrestricted ROM                  Functional ROM: Unrestricted ROM                  Muscle Tone/Strength: Functionally intact. No obvious neuro-muscular anomalies detected.  Muscle Tone/Strength: Functionally intact. No obvious neuro-muscular anomalies detected.  Sensory (Neurological): Unimpaired  Sensory (Neurological): Unimpaired  Palpation: No palpable anomalies  Palpation: No palpable anomalies   Assessment  Primary Diagnosis & Pertinent Problem List: The primary encounter diagnosis was Myofascial pain syndrome, cervical. Diagnoses of Chronic cervical pain, Chronic pain syndrome, Cervical facet joint syndrome, Spondylosis of cervical region without myelopathy or radiculopathy, Osteoarthritis of spine with radiculopathy, cervical region, and Cervicalgia were also pertinent to this visit.  Status Diagnosis  Having a Flare-up Having a Flare-up Persistent 1. Myofascial pain syndrome, cervical   2. Chronic cervical pain   3. Chronic pain syndrome   4. Cervical facet joint syndrome   5. Spondylosis of cervical region without myelopathy or radiculopathy   6. Osteoarthritis of spine with radiculopathy, cervical region   7. Cervicalgia     Problems updated and reviewed during this visit: Problem  Myofascial Pain Syndrome, Cervical  Chronic Pain Syndrome  Cervical Facet Joint Syndrome   62 year old female  with a history of cervical spondylosis status post cervical facet medial branch nerve blocks at C4, C5, C6, C7 which were effective for her neck and shoulder pain.  She is also status post cervical trigger point injections which were only effective for a couple of days.  She follows up for medication refill today.  Patient is requesting to increase her  hydrocodone from 7.5 mg to 10 mg.  This is reasonable given that the patient has tried conservative options, non-opioid analgesics as well as interventional therapies which have benefited her somewhat but it seems that she is having an acute on chronic pain flare today.  Patient is continuing her Effexor as prescribed.  Today we will refill her tizanidine as well as her hydrocodone and an increased dose of 10 mg 3 times daily as needed, quantity 90/month (from 7.5 mg 3 times daily as needed).  This is an MME increase from 22.5 to 30 mg.  Patient has been compliant with therapy and has tried non-opioid analgesics as well as interventional therapies.  We can discuss weaning back down in the future.  Plan: Refill tizanidine and hydrocodone as below, continue Effexor and gabapentin as prescribed  Interventional history: C4, C5, C6, C7 cervical facet medial branch nerve blocks which were effective, cervical trigger point injections which were not very effective Future considerations: Cervical epidural steroid injection targeting the left C7-T1  Plan of Care  Pharmacotherapy (Medications Ordered): Meds ordered this encounter  Medications  . tiZANidine (ZANAFLEX) 4 MG tablet    Sig: Take 1 tablet (4 mg total) by mouth every 8 (eight) hours as needed.    Dispense:  90 tablet    Refill:  2  . HYDROcodone-acetaminophen (NORCO) 10-325 MG tablet    Sig: Take 1 tablet by mouth 3 (three) times daily as needed for severe pain.    Dispense:  90 tablet    Refill:  0    Do not place this medication, or any other prescription from our practice, on "Automatic Refill". Patient may have prescription filled one day early if pharmacy is closed on scheduled refill date.  Marland Kitchen HYDROcodone-acetaminophen (NORCO) 10-325 MG tablet    Sig: Take 1 tablet by mouth 3 (three) times daily as needed for severe pain.    Dispense:  90 tablet    Refill:  0    Do not place this medication, or any other prescription from our practice,  on "Automatic Refill". Patient may have prescription filled one day early if pharmacy is closed on scheduled refill date.  Marland Kitchen HYDROcodone-acetaminophen (NORCO) 10-325 MG tablet    Sig: Take 1 tablet by mouth 3 (three) times daily as needed for severe pain.    Dispense:  90 tablet    Refill:  0    Do not place this medication, or any other prescription from our practice, on "Automatic Refill". Patient may have prescription filled one day early if pharmacy is closed on scheduled refill date.   Time Note: Greater than 50% of the 25 minute(s) of face-to-face time spent with Ms. Ricardo, was spent in counseling/coordination of care regarding: Ms. Meinhart primary cause of pain, the treatment plan, treatment alternatives, medication side effects, the opioid analgesic risks and possible complications, the results, interpretation and significance of  her recent diagnostic interventional treatment(s), the appropriate use of her medications, realistic expectations, the goals of pain management (increased in functionality), the medication agreement and the patient's responsibilities when it comes to controlled substances.  Provider-requested follow-up: Return in about 3  months (around 08/16/2018) for Medication Management.  Future Appointments  Date Time Provider [place]  05/30/2018 10:20 AM Steele Sizer, MD [facility]  08/15/2018 10:45 AM Gillis Santa, MD [facility] None  01/15/2019  2:45 PM Bjorn Loser, MD [facility] None  05/17/2019  9:00 AM Rubie Maid, MD [facility] None    Primary Care Physician: Steele Sizer, MD Location: [facility] Note by: Gillis Santa, M.D Date: 05/16/2018; Time: 1:07 PM  Patient Instructions  You were given 3 prescriptions for Hydrocodone and one for Tizanidine.

## 2018-05-17 LAB — CYTOLOGY - PAP
Diagnosis: NEGATIVE
HPV (WINDOPATH): NOT DETECTED

## 2018-05-18 ENCOUNTER — Encounter: Payer: Medicaid Other | Admitting: Student in an Organized Health Care Education/Training Program

## 2018-05-30 ENCOUNTER — Encounter: Payer: Self-pay | Admitting: Family Medicine

## 2018-05-30 ENCOUNTER — Ambulatory Visit: Payer: Medicaid Other | Admitting: Family Medicine

## 2018-05-30 VITALS — BP 120/68 | HR 86 | Temp 97.9°F | Resp 14 | Ht 64.0 in | Wt 180.7 lb

## 2018-05-30 DIAGNOSIS — F411 Generalized anxiety disorder: Secondary | ICD-10-CM | POA: Diagnosis not present

## 2018-05-30 DIAGNOSIS — E559 Vitamin D deficiency, unspecified: Secondary | ICD-10-CM

## 2018-05-30 DIAGNOSIS — E785 Hyperlipidemia, unspecified: Secondary | ICD-10-CM

## 2018-05-30 DIAGNOSIS — D692 Other nonthrombocytopenic purpura: Secondary | ICD-10-CM

## 2018-05-30 DIAGNOSIS — E213 Hyperparathyroidism, unspecified: Secondary | ICD-10-CM

## 2018-05-30 DIAGNOSIS — E118 Type 2 diabetes mellitus with unspecified complications: Secondary | ICD-10-CM

## 2018-05-30 DIAGNOSIS — Z85048 Personal history of other malignant neoplasm of rectum, rectosigmoid junction, and anus: Secondary | ICD-10-CM

## 2018-05-30 DIAGNOSIS — E1169 Type 2 diabetes mellitus with other specified complication: Secondary | ICD-10-CM

## 2018-05-30 DIAGNOSIS — M65331 Trigger finger, right middle finger: Secondary | ICD-10-CM

## 2018-05-30 DIAGNOSIS — J432 Centrilobular emphysema: Secondary | ICD-10-CM

## 2018-05-30 DIAGNOSIS — I479 Paroxysmal tachycardia, unspecified: Secondary | ICD-10-CM

## 2018-05-30 DIAGNOSIS — F339 Major depressive disorder, recurrent, unspecified: Secondary | ICD-10-CM

## 2018-05-30 DIAGNOSIS — I10 Essential (primary) hypertension: Secondary | ICD-10-CM

## 2018-05-30 DIAGNOSIS — E781 Pure hyperglyceridemia: Secondary | ICD-10-CM

## 2018-05-30 DIAGNOSIS — K219 Gastro-esophageal reflux disease without esophagitis: Secondary | ICD-10-CM

## 2018-05-30 MED ORDER — OMEPRAZOLE 40 MG PO CPDR: 40.0000 mg | DELAYED_RELEASE_CAPSULE | Freq: Every day | ORAL | 1 refills | Status: DC

## 2018-05-30 MED ORDER — LOSARTAN POTASSIUM 100 MG PO TABS: 100.0000 mg | ORAL_TABLET | Freq: Every day | ORAL | 1 refills | Status: DC

## 2018-05-30 MED ORDER — OMEGA-3-ACID ETHYL ESTERS 1 G PO CAPS: 2.0000 g | ORAL_CAPSULE | Freq: Two times a day (BID) | ORAL | 1 refills | Status: DC

## 2018-05-30 MED ORDER — VENLAFAXINE HCL ER 75 MG PO CP24: 225.0000 mg | ORAL_CAPSULE | Freq: Every day | ORAL | 1 refills | Status: DC

## 2018-05-30 MED ORDER — DIVALPROEX SODIUM 250 MG PO DR TAB: 250.0000 mg | DELAYED_RELEASE_TABLET | Freq: Every day | ORAL | 1 refills | Status: DC

## 2018-05-30 MED ORDER — METOPROLOL SUCCINATE ER 50 MG PO TB24: 50.0000 mg | ORAL_TABLET | Freq: Every day | ORAL | 1 refills | Status: DC

## 2018-05-30 NOTE — Progress Notes (Signed)
Name: Judy Allen   MRN: [id number]    DOB: November 05, 1955   Date:05/30/2018       Progress Note  Subjective  Chief Complaint  Chief Complaint  Patient presents with  . Follow-up    3 mth f/u  . Depression  . Hypertension  . Back Pain  . Sleep Apnea  . Dyslipidemia  . Anal Squamous cell Carcinoma  . Hand Pain    Onset- Saturday right hand, middle finger will get stuck-throbbing pain constant and is affecting her daily chores.    HPI  Hypertriglyceridemia: seeing Dr. Gabriel Carina, taking Lovaza and Gemfibrozil, tolerating medication well, she has not seen dietician yet, but has an appointment scheduled. Last triglycerides done 04/2018 improved, down to 200's  HTN: seen by pulmonologist and advised to stop HCTZ because calcium was high, bp at goal now, on losartan and metoprolol   Hyperparathyroidism: calcium back to normal since off hctz, denies cramps, no kidney stone, seeing Dr. Gabriel Carina   Emphysema and hypoxemia: on oxygen 2 liters - 24 hours per day, sob is stable, no cough or wheezing at this time, still taking spiriva and albuterol prn, at most once a month. Under the care of pulmonologist at [facility]. Used to be Dr. Olena Heckle but now sees a resident . Dr. Sheral Apley   Major Depression: she is doing better today, still grieving the loss of her husband ( May 11th, 2019), but higher dose of effexor and time seems to be helping. Phq 9 has improved, crying spells not as often, she has been cleaning her house and cooking every day.   Anal cancer: still going to [facility] for follow up, every 6 months for radiation oncologist and every 6 months with oncologist in alternating fashion. She still has intermittent rectal bleeding, no pain on the site.   DMII: new onset, diagnosed 07/209 by Dr. Gabriel Carina, hgbA1C was at 6.6% she is no on metformin, tolerating well, and last hgbA1C down to 6.3%. Triglycerides also improved, we will check urine micro. Denies polyphagia, she states she drinks all day because of  dry mouth. Reminded her to have yearly eye exam.   Trigger finger: right middle finger, pain and getting stuck for months, but much worse over the past few weeks. We will refer her to ortho.   Chronic pain: under the care of Dr. Holley Raring   Patient Active Problem List   Diagnosis Date Noted  . History of anal cancer 05/30/2018  . Myofascial pain syndrome, cervical 05/16/2018  . Chronic pain syndrome 05/16/2018  . Cervical facet joint syndrome 05/16/2018  . Perihepatitis ([place]) 09/27/2017  . Abnormal liver function test 09/27/2017  . Fatty liver 12/22/2016  . Chronic respiratory failure with hypoxia, on home oxygen therapy ([place]) 07/28/2016  . Centrilobular emphysema ([place]) 07/14/2016  . Abnormal Pap smear of cervix 04/14/2016  . Anal squamous cell carcinoma ([place]) 03/09/2016  . Metastatic squamous cell carcinoma ([place]) 02/25/2016  . Non-thrombocytopenic purpura ([place]) 01/12/2016  . Chronic constipation 10/16/2015  . History of fusion of cervical spine 04/02/2015  . Benign hypertension 01/16/2015  . Chronic cervical pain 01/16/2015  . CN (constipation) 01/16/2015  . Gastric reflux 01/16/2015  . Hypertriglyceridemia 01/16/2015  . Edema leg 01/16/2015  . Chronic recurrent major depressive disorder ([place]) 01/16/2015  . Dysmetabolic syndrome 56/31/4970  . Obstructive apnea 01/16/2015  . Psoriasis 01/16/2015  . Allergic rhinitis 01/16/2015  . Bursitis, trochanteric 01/16/2015  . GAD (generalized anxiety disorder) 01/16/2015  . Tachycardia, paroxysmal (HCC)     Past  Surgical History:  Procedure Laterality Date  . ANTERIOR FUSION LUMBAR SPINE  1990   plate in back, not metal  . CARDIAC CATHETERIZATION  2009?    [facility]  . CARDIAC CATHETERIZATION  05/2012   [facility]: Mild hypertension 37/12 with a mean pressure of 21 mm mercury  . CHOLECYSTECTOMY N/A 09/12/2017   Procedure: LAPAROSCOPIC CHOLECYSTECTOMY WITH INTRAOPERATIVE CHOLANGIOGRAM;  Surgeon: Robert Bellow, MD;  Location: [facility] OR[facility];   Service: General;  Laterality: N/A;  . COLONOSCOPY  2018  . herniated disc repair  2001  . LIVER BIOPSY N/A 09/12/2017   Procedure: LIVER BIOPSY;  Surgeon: Robert Bellow, MD;  Location: [facility];  Service: General;  Laterality: N/A;  . MASS EXCISION Left 02/23/2016   Procedure: EXCISION MASS;  Surgeon: Christene Lye, MD;  Location: [facility] OR[facility];  Service: General;  Laterality: Left;  . NECK SURGERY    . PORT-A-CATH REMOVAL  2018  . PORTACATH PLACEMENT Left 03/12/2016   Procedure: INSERTION PORT-A-CATH;  Surgeon: Christene Lye, MD;  Location: [facility];  Service: General;  Laterality: Left;  . tonsillectomy  1959   history  . TUBAL LIGATION     s/p    Family History  Problem Relation Age of Onset  . Heart attack Mother   . Asthma Mother   . Liver cancer Father   . Breast cancer Maternal Aunt   . Bladder Cancer Neg Hx   . Kidney cancer Neg Hx     Social History   Socioeconomic History  . Marital status: Widowed    Spouse name: Automotive engineer  . Number of children: 3  . Years of education: Not on file  . Highest education level: 11th grade  Occupational History  . Not on file  Social Needs  . Financial resource strain: Somewhat hard  . Food insecurity:    Worry: Sometimes true    Inability: Sometimes true  . Transportation needs:    Medical: No    Non-medical: No  Tobacco Use  . Smoking status: Former Smoker    Packs/day: 1.00    Years: 32.00    Pack years: 32.00    Types: Cigarettes    Start date: 08/10/1963    Last attempt to quit: 01/15/2006    Years since quitting: 12.3  . Smokeless tobacco: Never Used  Substance and Sexual Activity  . Alcohol use: No    Alcohol/week: 0.0 standard drinks  . Drug use: No  . Sexual activity: Never    Birth control/protection: Post-menopausal  Lifestyle  . Physical activity:    Days per week: 0 days    Minutes per session: 0 min  . Stress: Very much  Relationships  . Social connections:    Talks on phone: More  than three times a week    Gets together: Once a week    Attends religious service: Never    Active member of club or organization: No    Attends meetings of clubs or organizations: Never    Relationship status: Widowed  . Intimate partner violence:    Fear of current or ex partner: No    Emotionally abused: No    Physically abused: No    Forced sexual activity: No  Other Topics Concern  . Not on file  Social History Narrative   Husband passed away on 2018/01/01     Current Outpatient Medications:  .  albuterol (PROVENTIL HFA;VENTOLIN HFA) 108 (90 Base) MCG/ACT inhaler, Inhale 2 puffs into the lungs  every 6 (six) hours as needed for wheezing or shortness of breath., Disp: , Rfl:  .  conjugated estrogens (PREMARIN) vaginal cream, Place 0.5 Applicatorfuls vaginally every 3 (three) days. Also apply externally to urethra every 3 days, Disp: 42.5 g, Rfl: 3 .  divalproex (DEPAKOTE) 250 MG DR tablet, Take 1 tablet (250 mg total) by mouth at bedtime., Disp: 90 tablet, Rfl: 1 .  fluticasone (FLONASE) 50 MCG/ACT nasal spray, Place 2 sprays into the nose daily., Disp: , Rfl:  .  gabapentin (NEURONTIN) 300 MG capsule, Take 1 capsule (300 mg total) by mouth 3 (three) times daily., Disp: 90 capsule, Rfl: 3 .  gemfibrozil (LOPID) 600 MG tablet, Take 1 tablet by mouth 2 (two) times daily., Disp: , Rfl:  .  [START ON 05/31/2018] HYDROcodone-acetaminophen (NORCO) 10-325 MG tablet, Take 1 tablet by mouth 3 (three) times daily as needed for severe pain., Disp: 90 tablet, Rfl: 0 .  [START ON 06/30/2018] HYDROcodone-acetaminophen (NORCO) 10-325 MG tablet, Take 1 tablet by mouth 3 (three) times daily as needed for severe pain., Disp: 90 tablet, Rfl: 0 .  [START ON 07/30/2018] HYDROcodone-acetaminophen (NORCO) 10-325 MG tablet, Take 1 tablet by mouth 3 (three) times daily as needed for severe pain., Disp: 90 tablet, Rfl: 0 .  hydrocortisone 2.5 % cream, Apply 1 application topically 2 (two) times daily., Disp:  , Rfl:  .  hydrOXYzine (ATARAX/VISTARIL) 25 MG tablet, TK 1 T PO QHS, Disp: , Rfl: 1 .  ketoconazole (NIZORAL) 2 % cream, Apply 1 application topically 2 (two) times daily., Disp: , Rfl:  .  losartan (COZAAR) 100 MG tablet, Take 1 tablet (100 mg total) by mouth daily., Disp: 90 tablet, Rfl: 1 .  metFORMIN (GLUCOPHAGE-XR) 500 MG 24 hr tablet, Take 2 tablets by mouth daily., Disp: , Rfl: 5 .  metoprolol succinate (TOPROL-XL) 50 MG 24 hr tablet, Take 1 tablet (50 mg total) by mouth daily., Disp: 90 tablet, Rfl: 1 .  mirabegron ER (MYRBETRIQ) 50 MG TB24 tablet, Take 1 tablet (50 mg total) by mouth daily., Disp: 30 tablet, Rfl: 11 .  omega-3 acid ethyl esters (LOVAZA) 1 g capsule, Take 2 capsules (2 g total) by mouth 2 (two) times daily., Disp: 360 capsule, Rfl: 1 .  omeprazole (PRILOSEC) 40 MG capsule, Take 1 capsule (40 mg total) by mouth daily., Disp: 90 capsule, Rfl: 1 .  OXYGEN, Inhale 2 L into the lungs continuous. , Disp: , Rfl:  .  tiotropium (SPIRIVA) 18 MCG inhalation capsule, Place 18 mcg into inhaler and inhale daily. [facility], Disp: , Rfl:  .  tiZANidine (ZANAFLEX) 4 MG tablet, Take 1 tablet (4 mg total) by mouth every 8 (eight) hours as needed., Disp: 90 tablet, Rfl: 2 .  venlafaxine XR (EFFEXOR-XR) 75 MG 24 hr capsule, Take 3 capsules (225 mg total) by mouth daily with breakfast., Disp: 270 capsule, Rfl: 1 .  Vitamin D, Ergocalciferol, (DRISDOL) 50000 units CAPS capsule, Take by mouth., Disp: , Rfl:  .  betamethasone dipropionate (DIPROLENE) 0.05 % cream, Apply 1 application topically 2 (two) times daily as needed., Disp: , Rfl:  .  triamcinolone cream (KENALOG) 0.1 %, APP TOPICALLY AA BID, Disp: , Rfl: 0  Allergies  Allergen Reactions  . Dapsone Other (See Comments)    Hypoxemia  . Hydrochlorothiazide     History of hyperparathyroidism   . Sulfa Antibiotics Rash  . Sulfasalazine Rash    I personally reviewed active problem list, medication list, allergies, family history, social  history  with the patient/caregiver today.   ROS  Constitutional: Negative for fever or weight change.  Respiratory: Negative for cough but has daily  shortness of breath.   Cardiovascular: Negative for chest pain, positive for intermittent  palpitations.  Gastrointestinal: Negative for abdominal pain, no bowel changes.  Musculoskeletal: Negative for gait problem or joint swelling.  Skin: Negative for rash.  Neurological: Negative for dizziness or headache.  No other specific complaints in a complete review of systems (except as listed in HPI above).  Objective  Vitals:   05/30/18 1035  BP: 120/68  Pulse: 86  Resp: 14  Temp: 97.9 F (36.6 C)  TempSrc: Oral  SpO2: 93%  Weight: 180 lb 11.2 oz (82 kg)  Height: 5\' 4"  (1.626 m)    Body mass index is 31.02 kg/m.  Physical Exam  Constitutional: Patient appears well-developed and well-nourished. Obese No distress.  HEENT: head atraumatic, normocephalic, pupils equal and reactive to light, nasal canula oxygen  neck supple, throat within normal limits Cardiovascular: Normal rate, regular rhythm and normal heart sounds.  No murmur heard. No BLE edema. Pulmonary/Chest: Effort normal and breath sounds normal. No respiratory distress. Abdominal: Soft.  There is no tenderness. Psychiatric: Patient has a normal mood and affect. behavior is normal. Judgment and thought content normal. Skin: dry skin, senile purpura on left arm   Recent Results (from the past 2160 hour(s))  Cytology - PAP     Status: None   Collection Time: 05/15/18 12:00 AM  Result Value Ref Range   Adequacy      Satisfactory for evaluation  endocervical/transformation zone component PRESENT.   Diagnosis      NEGATIVE FOR INTRAEPITHELIAL LESIONS OR MALIGNANCY.   HPV NOT DETECTED     Comment: Normal Reference Range - NOT Detected   Material Submitted CervicoVaginal Pap [ThinPrep Imaged]    CYTOLOGY - PAP PAP RESULT       PHQ2/9: Depression screen [facility] 2/9  05/30/2018 05/16/2018 02/24/2018 02/20/2018 02/16/2018  Decreased Interest 1 0 1 0 0  Down, Depressed, Hopeless 1 0 3 0 0  PHQ - 2 Score 2 0 4 0 0  Altered sleeping 0 - 3 - -  Tired, decreased energy 1 - 3 - -  Change in appetite 0 - 0 - -  Feeling bad or failure about yourself  0 - 0 - -  Trouble concentrating 0 - 1 - -  Moving slowly or fidgety/restless 0 - 0 - -  Suicidal thoughts 0 - 0 - -  PHQ-9 Score 3 - 11 - -  Difficult doing work/chores Somewhat difficult - Very difficult - -  Some recent data might be hidden     Fall Risk: Fall Risk  05/30/2018 05/16/2018 02/24/2018 02/20/2018 02/16/2018  Falls in the past year? No No No No No  Number falls in past yr: - - - - -  Injury with Fall? - - - - -  Follow up - - - - -      Assessment & Plan  1. Dyslipidemia associated with type 2 diabetes mellitus ([place])  Seeing Dr. Gabriel Carina, on metformin , last hgbA1C has improved down from 6.6% to 6.3%  2. Type 2 diabetes with complication (HCC)   3. Hyperparathyroidism ([place])  Calcium back to normal, bone density monitored by Dr. Gabriel Carina   4. GAD (generalized anxiety disorder)  - venlafaxine XR (EFFEXOR-XR) 75 MG 24 hr capsule; Take 3 capsules (225 mg total) by mouth daily with breakfast.  Dispense: 270 capsule;  Refill: 1  5. Chronic recurrent major depressive disorder (HCC)  - divalproex (DEPAKOTE) 250 MG DR tablet; Take 1 tablet (250 mg total) by mouth at bedtime.  Dispense: 90 tablet; Refill: 1 - venlafaxine XR (EFFEXOR-XR) 75 MG 24 hr capsule; Take 3 capsules (225 mg total) by mouth daily with breakfast.  Dispense: 270 capsule; Refill: 1  6. Benign hypertension  - losartan (COZAAR) 100 MG tablet; Take 1 tablet (100 mg total) by mouth daily.  Dispense: 90 tablet; Refill: 1 - metoprolol succinate (TOPROL-XL) 50 MG 24 hr tablet; Take 1 tablet (50 mg total) by mouth daily.  Dispense: 90 tablet; Refill: 1  7. Tachycardia, paroxysmal (HCC)  - metoprolol succinate (TOPROL-XL) 50 MG 24 hr  tablet; Take 1 tablet (50 mg total) by mouth daily.  Dispense: 90 tablet; Refill: 1  8. History of anal cancer  Under the care of oncologist and radiation oncologist at [place]. Centrilobular emphysema ([place])  Still depends on oxygen   10. Hypertriglyceridemia  Last triglycerides done at endo was below 300  - omega-3 acid ethyl esters (LOVAZA) 1 g capsule; Take 2 capsules (2 g total) by mouth 2 (two) times daily.  Dispense: 360 capsule; Refill: 1  11. GERD without esophagitis  - omeprazole (PRILOSEC) 40 MG capsule; Take 1 capsule (40 mg total) by mouth daily.  Dispense: 90 capsule; Refill: 1  12. Vitamin D deficiency  - Vitamin D, Ergocalciferol, (DRISDOL) 50000 units CAPS capsule; Take by mouth.  13. Trigger middle finger of right hand  - Ambulatory referral to Orthopedic Surgery  14. Senile purpura ([place])  reassurance

## 2018-05-31 ENCOUNTER — Encounter: Payer: Self-pay | Admitting: Family Medicine

## 2018-05-31 LAB — MICROALBUMIN / CREATININE URINE RATIO
CREATININE, URINE: 48 mg/dL (ref 20–275)
MICROALB/CREAT RATIO: 50 ug/mg{creat} — AB (ref <30)
Microalb, Ur: 2.4 mg/dL

## 2018-06-07 ENCOUNTER — Other Ambulatory Visit: Payer: Self-pay | Admitting: Student in an Organized Health Care Education/Training Program

## 2018-06-07 DIAGNOSIS — M542 Cervicalgia: Principal | ICD-10-CM

## 2018-06-07 DIAGNOSIS — G8929 Other chronic pain: Secondary | ICD-10-CM

## 2018-06-08 NOTE — Telephone Encounter (Signed)
Patient states she needs refill on gabapentin for 3 times a day / Dr. Holley Raring. Can this be put in for 3 months supply

## 2018-06-12 NOTE — Telephone Encounter (Signed)
Gabapentin sent in.

## 2018-07-03 ENCOUNTER — Ambulatory Visit: Payer: BLUE CROSS/BLUE SHIELD | Admitting: Urology

## 2018-07-03 ENCOUNTER — Other Ambulatory Visit: Payer: Self-pay

## 2018-07-03 ENCOUNTER — Encounter: Payer: Self-pay | Admitting: Urology

## 2018-07-03 VITALS — BP 156/94 | HR 83 | Temp 98.1°F | Ht 64.0 in | Wt 182.5 lb

## 2018-07-03 DIAGNOSIS — R310 Gross hematuria: Secondary | ICD-10-CM | POA: Diagnosis not present

## 2018-07-03 DIAGNOSIS — R32 Unspecified urinary incontinence: Secondary | ICD-10-CM

## 2018-07-03 LAB — URINALYSIS, COMPLETE
BILIRUBIN UA: NEGATIVE
GLUCOSE, UA: NEGATIVE
KETONES UA: NEGATIVE
Nitrite, UA: NEGATIVE
PH UA: 6 (ref 5.0–7.5)
PROTEIN UA: NEGATIVE
RBC UA: NEGATIVE
SPEC GRAV UA: 1.01 (ref 1.005–1.030)
UUROB: 0.2 mg/dL (ref 0.2–1.0)

## 2018-07-03 LAB — MICROSCOPIC EXAMINATION
EPITHELIAL CELLS (NON RENAL): NONE SEEN /HPF (ref 0–10)
RBC MICROSCOPIC, UA: NONE SEEN /HPF (ref 0–2)

## 2018-07-03 NOTE — Progress Notes (Signed)
07/03/2018 11:30 AM   Judy Allen 04/11/1956 [id number]  Referring provider: Steele Sizer, MD [address] [place], [place] [address]  Chief Complaint  Patient presents with  . Follow-up     still having leakage with intermittent blood     HPI: I was consulted to assess the patient's urinary incontinence worsening over a few months. In the last year or so she had anal cancer with chemotherapy and radiation but no surgery. She can leak without awareness but also describes urgency and trying to undress prior to urinating. She denies stress incontinence. She denied bedwetting but then noted a foul-smelling discharge from the urethra she thinks. She tends towards constipation. She may have a vaginal discharge that is clear. She wears 2 pads a day sometimes damp sometimes moderately wet  She has had 2 neck surgery and one lower back operation. She has not had a hysterectomy  GU:On pelvic examination the patient had moderately severe vaginal atrophy. I saw no vaginitis discharge blood or urine in the vaginal vault. She had a grade 1 or small grade 2 cystocele. She had elevated tissue underneath the urethra that was nontender and she probably does not have a urethral diverticulum but she could. She had a small distal rectocele. The anus looked within normal limits  The patient has urinary incontinence not associated with awareness. She denies stress incontinence but I believe she also has urgency incontinence. She has had pelvic radiation. Her degree of leakage and pelvic examis not in keeping with the fistula.  I may or may not order urodynamics immediately.   The patient was 80% improved on Myrbetriq but Medicaid will not cover it.   Cystoscopy: normal  Toviaz did not work. Once again she said she was doing great on Myrbetriq but she says she does not think Medicaid covers any of it.  I gave her 2 prescriptions one for Detrol LA 4 mg and  oxybutynin ER 10 mg and reassess in 8 weeks. I might order urodynamics. She is on home oxygen. The patient failed oxybutynin and tolterodine.  The patient has refractory urgency incontinence and leakage without awareness and is done beautifully on Myrbetriq.  I gave her samples and prescription.  Because of radiation she is not a good candidate for Botox.  Because a home oxygen and COPD she is not a good candidate in my opinion for InterStim.  I do not think that Medicaid will cover percutaneous tibial nerve stimulation.  More samples and prescription given.  Hopefully with step therapy Medicaid will cover the only medication that is working for her.    Today The patient is taking Myrbetriq and now only wears 1 or 2 pads a day.  In the last 2 months she has sometimes noticed redness in the urine or brown in the urine.  Sometimes it is foul-smelling.  It comes and goes.  She has no dysuria.  Her pelvic radiation was in October 2017.  She has not had a recent abdominal or pelvic CT scan but is scheduled to have one in January.  She has not noted bubble or air with urination  Modifying factors: There are no other modifying factors  Associated signs and symptoms: There are no other associated signs and symptoms Aggravating and relieving factors: There are no other aggravating or relieving factors Severity: Moderate Duration: Persistent  Pelvic examination demonstrated good vaginal length with no obvious fistula.  Atrophy with petechiae.  No stress incontinence     PMH: Past  Medical History:  Diagnosis Date  . Anemia    H/O  . Anxiety   . Arthritis   . Colon cancer ([facility]) 2017   anal ca/chemo and rad  . Complication of anesthesia   . Constipation   . COPD (chronic obstructive pulmonary disease) ([facility])    also emphysema  . Depression   . Diabetes mellitus without complication ([place])   . Dysrhythmia    TACHYCARDIA-WELL CONTROLLED ON METOPROLO  . GERD (gastroesophageal reflux disease)     . H/O allergic rhinitis   . Heart murmur   . Hemorrhoid   . Hyperlipidemia   . Hypertension   . Neck pain, chronic   . Neuritis   . Ovarian failure   . Personal history of chemotherapy   . Personal history of radiation therapy   . PONV (postoperative nausea and vomiting)   . Pre-diabetes 2019   just being followed.  no meds  . Proteinuria   . Psoriasis   . Sleep apnea 09/12/2016   8cm H2O with 21pm oxygen mask: Eson size Medium. Heated humidifier for nasal dryness.  . Tachycardia, paroxysmal ([facility])   . Urinary incontinence     Surgical History: Past Surgical History:  Procedure Laterality Date  . ANTERIOR FUSION LUMBAR SPINE  1990   plate in back, not metal  . CARDIAC CATHETERIZATION  2009?    [facility];[place]  . CARDIAC CATHETERIZATION  05/2012   [facility]: Mild hypertension 37/12 with a mean pressure of 21 mm mercury  . CHOLECYSTECTOMY N/A 09/12/2017   Procedure: LAPAROSCOPIC CHOLECYSTECTOMY WITH INTRAOPERATIVE CHOLANGIOGRAM;  Surgeon: Robert Bellow, MD;  Location: [facility];  Service: General;  Laterality: N/A;  . COLONOSCOPY  2018  . herniated disc repair  2001  . LIVER BIOPSY N/A 09/12/2017   Procedure: LIVER BIOPSY;  Surgeon: Robert Bellow, MD;  Location: [facility];  Service: General;  Laterality: N/A;  . MASS EXCISION Left 02/23/2016   Procedure: EXCISION MASS;  Surgeon: Christene Lye, MD;  Location: [facility];  Service: General;  Laterality: Left;  . NECK SURGERY    . PORT-A-CATH REMOVAL  2018  . PORTACATH PLACEMENT Left 03/12/2016   Procedure: INSERTION PORT-A-CATH;  Surgeon: Christene Lye, MD;  Location: [facility];  Service: General;  Laterality: Left;  . tonsillectomy  1959   history  . TUBAL LIGATION     s/p    Home Medications:  Allergies as of 07/03/2018      Reactions   Dapsone Other (See Comments)   Hypoxemia   Hydrochlorothiazide    History of hyperparathyroidism    Sulfa Antibiotics Rash   Sulfasalazine Rash      Medication List         Accurate as of 07/03/18 11:30 AM. Always use your most recent med list.          albuterol 108 (90 Base) MCG/ACT inhaler Commonly known as:  PROVENTIL HFA;VENTOLIN HFA Inhale 2 puffs into the lungs every 6 (six) hours as needed for wheezing or shortness of breath.   betamethasone dipropionate 0.05 % cream Commonly known as:  DIPROLENE Apply 1 application topically 2 (two) times daily as needed.   conjugated estrogens vaginal cream Commonly known as:  PREMARIN Place 0.5 Applicatorfuls vaginally every 3 (three) days. Also apply externally to urethra every 3 days   divalproex 250 MG DR tablet Commonly known as:  DEPAKOTE Take 1 tablet (250 mg total) by mouth at bedtime.   fluticasone 50 MCG/ACT nasal spray Commonly known as:  FLONASE Place 2 sprays into the nose daily.   gabapentin 300 MG capsule Commonly known as:  NEURONTIN TAKE 1 CAPSULE BY MOUTH THREE TIMES DAILY   gemfibrozil 600 MG tablet Commonly known as:  LOPID Take 1 tablet by mouth 2 (two) times daily.   HYDROcodone-acetaminophen 10-325 MG tablet Commonly known as:  NORCO Take 1 tablet by mouth 3 (three) times daily as needed for severe pain.   HYDROcodone-acetaminophen 10-325 MG tablet Commonly known as:  NORCO Take 1 tablet by mouth 3 (three) times daily as needed for severe pain. Start taking on:  07/30/2018   hydrocortisone 2.5 % cream Apply 1 application topically 2 (two) times daily.   hydrOXYzine 25 MG tablet Commonly known as:  ATARAX/VISTARIL TK 1 T PO QHS   ketoconazole 2 % cream Commonly known as:  NIZORAL Apply 1 application topically 2 (two) times daily.   losartan 100 MG tablet Commonly known as:  COZAAR Take 1 tablet (100 mg total) by mouth daily.   metFORMIN 500 MG 24 hr tablet Commonly known as:  GLUCOPHAGE-XR Take 2 tablets by mouth daily.   metoprolol succinate 50 MG 24 hr tablet Commonly known as:  TOPROL-XL Take 1 tablet (50 mg total) by mouth daily.   mirabegron ER 50  MG Tb24 tablet Commonly known as:  MYRBETRIQ Take 1 tablet (50 mg total) by mouth daily.   omega-3 acid ethyl esters 1 g capsule Commonly known as:  LOVAZA Take 2 capsules (2 g total) by mouth 2 (two) times daily.   omeprazole 40 MG capsule Commonly known as:  PRILOSEC Take 1 capsule (40 mg total) by mouth daily.   oxybutynin 10 MG 24 hr tablet Commonly known as:  DITROPAN-XL oxybutynin chloride ER 10 mg tablet,extended release 24 hr  TAKE 1 TABLET BY MOUTH DAILY   oxyCODONE-acetaminophen 5-325 MG tablet Commonly known as:  PERCOCET/ROXICET oxycodone-acetaminophen 5 mg-325 mg tablet  TAKE 1 TABLET BY MOUTH EVERY 4 (FOUR) HOURS AS NEEDED FOR SEVERE PAIN.   OXYGEN Inhale 2 L into the lungs continuous.   tiotropium 18 MCG inhalation capsule Commonly known as:  SPIRIVA Place 18 mcg into inhaler and inhale daily. [facility]   tiZANidine 4 MG tablet Commonly known as:  ZANAFLEX Take 1 tablet (4 mg total) by mouth every 8 (eight) hours as needed.   tolterodine 4 MG 24 hr capsule Commonly known as:  DETROL LA tolterodine ER 4 mg capsule,extended release 24 hr  TAKE ONE CAPSULE BY MOUTH DAILY   triamcinolone cream 0.1 % Commonly known as:  KENALOG APP TOPICALLY AA BID   venlafaxine XR 75 MG 24 hr capsule Commonly known as:  EFFEXOR-XR Take 3 capsules (225 mg total) by mouth daily with breakfast.   Vitamin D (Ergocalciferol) 1.25 MG (50000 UT) Caps capsule Commonly known as:  DRISDOL Take by mouth.       Allergies:  Allergies  Allergen Reactions  . Dapsone Other (See Comments)    Hypoxemia  . Hydrochlorothiazide     History of hyperparathyroidism   . Sulfa Antibiotics Rash  . Sulfasalazine Rash    Family History: Family History  Problem Relation Age of Onset  . Heart attack Mother   . Asthma Mother   . Liver cancer Father   . Breast cancer Maternal Aunt   . Bladder Cancer Neg Hx   . Kidney cancer Neg Hx     Social History:  reports that she quit smoking about  12 years ago. Her smoking use included cigarettes.  She started smoking about 54 years ago. She has a 32.00 pack-year smoking history. She has never used smokeless tobacco. She reports that she does not drink alcohol or use drugs.  ROS: UROLOGY Frequent Urination?: No Hard to postpone urination?: No Burning/pain with urination?: No Get up at night to urinate?: No Leakage of urine?: Yes Urine stream starts and stops?: No Trouble starting stream?: No Do you have to strain to urinate?: No Blood in urine?: Yes Urinary tract infection?: No Sexually transmitted disease?: No Injury to kidneys or bladder?: No Painful intercourse?: No Weak stream?: No Currently pregnant?: No Vaginal bleeding?: No Last menstrual period?: n  Gastrointestinal Nausea?: No Vomiting?: No Indigestion/heartburn?: No Diarrhea?: No Constipation?: No  Constitutional Fever: No Night sweats?: No Weight loss?: No Fatigue?: No  Skin Skin rash/lesions?: No Itching?: No  Eyes Blurred vision?: No Double vision?: No  Ears/Nose/Throat Sore throat?: No Sinus problems?: No  Hematologic/Lymphatic Swollen glands?: No Easy bruising?: No  Cardiovascular Leg swelling?: No Chest pain?: No  Respiratory Cough?: No Shortness of breath?: No  Endocrine Excessive thirst?: No  Musculoskeletal Back pain?: No Joint pain?: No  Neurological Headaches?: No Dizziness?: No  Psychologic Depression?: No Anxiety?: No  Physical Exam: BP (!) 156/94   Pulse 83   Temp 98.1 F (36.7 C) (Oral)   Ht 5\' 4"  (1.626 m)   Wt 182 lb 8 oz (82.8 kg)   LMP 03/04/2002 (Approximate)   SpO2 91%   BMI 31.33 kg/m   Constitutional:  Alert and oriented, No acute distress.  Laboratory Data: Lab Results  Component Value Date   WBC 7.8 08/03/2017   HGB 12.8 08/03/2017   HCT 37.0 08/03/2017   MCV 88.3 08/03/2017   PLT 262 08/03/2017    Lab Results  Component Value Date   CREATININE 0.90 12/06/2017    No results  found for: PSA  No results found for: TESTOSTERONE  Lab Results  Component Value Date   HGBA1C 6.6 (A) 02/27/2018    Urinalysis    Component Value Date/Time   COLORURINE STRAW (A) 11/02/2015 1227   APPEARANCEUR Clear 07/04/2017 1047   LABSPEC 1.008 11/02/2015 1227   LABSPEC 1.005 06/08/2012 1117   PHURINE 7.0 11/02/2015 1227   GLUCOSEU Negative 07/04/2017 1047   GLUCOSEU Negative 06/08/2012 1117   HGBUR NEGATIVE 11/02/2015 1227   BILIRUBINUR Negative 07/04/2017 1047   BILIRUBINUR Negative 06/08/2012 1117   [place] 11/02/2015 1227   PROTEINUR Negative 07/04/2017 1047   PROTEINUR NEGATIVE 11/02/2015 1227   NITRITE Negative 07/04/2017 1047   NITRITE NEGATIVE 11/02/2015 1227   LEUKOCYTESUR Trace 07/04/2017 1047   LEUKOCYTESUR Negative 06/08/2012 1117    Pertinent Imaging:   Assessment & Plan: The patient is doing exceptionally well on the Myrbetriq.  She has had a normal cystoscopy.  If she does have a fistula her symptoms are milder and intermittent.  Her urinalysis was normal but I sent it for culture.  I could not find an order for a CT scan in [facility].  I ordered a CT of the abdomen and pelvis with and without contrast and she will come back for cystoscopy.  There are no diagnoses linked to this encounter.  No follow-ups on file.  Reece Packer, MD  [facility] [address], [place], [place] [address] [phone]

## 2018-07-05 LAB — CULTURE, URINE COMPREHENSIVE

## 2018-07-14 ENCOUNTER — Ambulatory Visit
Admission: RE | Admit: 2018-07-14 | Discharge: 2018-07-14 | Disposition: A | Discharge status: Home / Self Care | Payer: BLUE CROSS/BLUE SHIELD | Source: Ambulatory Visit | Attending: Urology | Admitting: Urology

## 2018-07-14 DIAGNOSIS — R310 Gross hematuria: Secondary | ICD-10-CM | POA: Insufficient documentation

## 2018-07-14 DIAGNOSIS — R32 Unspecified urinary incontinence: Secondary | ICD-10-CM | POA: Diagnosis present

## 2018-07-14 LAB — POCT I-STAT CREATININE: CREATININE: 0.9 mg/dL (ref 0.44–1.00)

## 2018-07-14 MED ORDER — IOPAMIDOL (ISOVUE-300) INJECTION 61%
125.0000 mL | Freq: Once | INTRAVENOUS | Status: AC | PRN
  Administered 2018-07-14: 125 mL via INTRAVENOUS

## 2018-07-20 ENCOUNTER — Encounter: Payer: Self-pay | Admitting: Family Medicine

## 2018-07-24 ENCOUNTER — Other Ambulatory Visit: Payer: BLUE CROSS/BLUE SHIELD | Admitting: Urology

## 2018-08-15 ENCOUNTER — Encounter: Payer: BLUE CROSS/BLUE SHIELD | Admitting: Student in an Organized Health Care Education/Training Program

## 2018-08-19 ENCOUNTER — Other Ambulatory Visit: Payer: Self-pay | Admitting: Student in an Organized Health Care Education/Training Program

## 2018-08-19 DIAGNOSIS — G8929 Other chronic pain: Secondary | ICD-10-CM

## 2018-08-19 DIAGNOSIS — M542 Cervicalgia: Principal | ICD-10-CM

## 2018-08-22 ENCOUNTER — Encounter: Payer: BLUE CROSS/BLUE SHIELD | Admitting: Student in an Organized Health Care Education/Training Program

## 2018-08-22 ENCOUNTER — Telehealth: Payer: Self-pay | Admitting: Family Medicine

## 2018-08-22 ENCOUNTER — Other Ambulatory Visit: Payer: Self-pay

## 2018-08-22 MED ORDER — ICOSAPENT ETHYL 1 G PO CAPS: 2.0000 g | ORAL_CAPSULE | Freq: Two times a day (BID) | ORAL | 1 refills | Status: DC

## 2018-08-22 NOTE — Telephone Encounter (Signed)
Copied from [place] [id number]. Topic: General - Other >> Aug 22, 2018  9:59 AM Oneta Rack wrote: Osvaldo Human name: Willette Cluster  Relation to pt: from [facility] [place]  Call back number: [phone]    Reason for call:  omega-3 acid ethyl esters (LOVAZA) 1 g capsule  PA was denied

## 2018-08-22 NOTE — Telephone Encounter (Signed)
Lovaza is denied by [employer]. Alternative is Vascepa.

## 2018-08-22 NOTE — Telephone Encounter (Signed)
Changed to Vascepa

## 2018-08-28 ENCOUNTER — Ambulatory Visit: Payer: BLUE CROSS/BLUE SHIELD | Admitting: Urology

## 2018-08-28 ENCOUNTER — Encounter: Payer: Self-pay | Admitting: Urology

## 2018-08-28 VITALS — BP 144/89 | HR 84 | Ht 64.0 in | Wt 187.1 lb

## 2018-08-28 DIAGNOSIS — R310 Gross hematuria: Secondary | ICD-10-CM | POA: Diagnosis not present

## 2018-08-28 LAB — URINALYSIS, COMPLETE
Bilirubin, UA: NEGATIVE
Glucose, UA: NEGATIVE
Ketones, UA: NEGATIVE
Nitrite, UA: NEGATIVE
PH UA: 6 (ref 5.0–7.5)
PROTEIN UA: NEGATIVE
RBC, UA: NEGATIVE
Specific Gravity, UA: 1.01 (ref 1.005–1.030)
Urobilinogen, Ur: 0.2 mg/dL (ref 0.2–1.0)

## 2018-08-28 LAB — MICROSCOPIC EXAMINATION
EPITHELIAL CELLS (NON RENAL): NONE SEEN /HPF (ref 0–10)
RBC, UA: NONE SEEN /hpf (ref 0–2)

## 2018-08-28 NOTE — Progress Notes (Signed)
08/28/2018 10:08 AM   Judy Allen 25-Oct-1955 [id number]  Referring provider: Steele Sizer, MD [address] [place], [place] [address]  Chief Complaint  Patient presents with  . Cysto    HPI: I was consulted to assess the patient's urinary incontinence worsening over a few months. In the last year or so she had anal cancer with chemotherapy and radiation but no surgery. She can leak without awareness but also describes urgency and trying to undress prior to urinating. She denies stress incontinence. She denied bedwetting but then noted a foul-smelling discharge from the urethra she thinks. She wears 2 pads a day sometimes damp sometimes moderately wet  She has had 2 neck surgery and one lower back operation. She has not had a hysterectomy  GU:On pelvic examination the patient had moderately severe vaginal atrophy. I saw no vaginitis discharge blood or urine in the vaginal vault. She had a grade 1 or small grade 2 cystocele. She had elevated tissue underneath the urethra that was nontender and she probably does not have a urethral diverticulum but she could. She had a small distal rectocele. The anus looked within normal limits  The patient has urinary incontinence not associated with awareness. She denies stress incontinence but I believe she also has urgency incontinence. She has had pelvic radiation. Her degree of leakage and pelvic examis not in keeping with the fistula.    The patient was 80% improved on Myrbetriq but Medicaid will not cover it.   Cystoscopy: normal  Toviaz did not work. Once again she said she was doing great on Myrbetriq but she says she does not think Medicaid covers any of it.  I might order urodynamics. She is on home oxygen. The patient failed oxybutynin and tolterodine.  The patient has refractory urgency incontinence and leakage without awareness and is done beautifully on Myrbetriq. I gave her samples and  prescription. Because of radiation she is not a good candidate for Botox. Because a home oxygen and COPD she is not a good candidate in my opinion for InterStim. I do not think that Medicaid will cover percutaneous tibial nerve stimulation. More samples and prescription given. Hopefully with step therapy Medicaid will cover the only medication that is working for her.   The patient is taking Myrbetriq and now only wears 1 or 2 pads a day.  In the last 2 months she has sometimes noticed redness in the urine or brown in the urine.  Sometimes it is foul-smelling.  It comes and goes.  She has no dysuria.  Her pelvic radiation was in October 2017.  She has not had a recent abdominal or pelvic CT scan but is scheduled to have one in January.  She has not noted bubble or air with urination  Pelvic examination demonstrated good vaginal length with no obvious fistula.  Atrophy with petechiae.  No stress incontinence  The patient is doing exceptionally well on the Myrbetriq.  She has had a normal cystoscopy.  If she does have a fistula her symptoms are milder and intermittent.  Her urinalysis was normal but I sent it for culture.  I could not find an order for a CT scan in [facility].  I ordered a CT of the abdomen and pelvis with and without contrast and she will come back for cystoscopy.  Today Last urine culture negative.  CT scan normal.  Frequency is stable.  No obvious gross hematuria  Cystoscopy: Patient underwent flexible cystoscopy.  Bladder mucosa and  trigone were normal.  No cystitis.  No carcinoma.  No fistula.  Ureteral orifices normal.  Tolerated well   PMH: Past Medical History:  Diagnosis Date  . Anemia    H/O  . Anxiety   . Arthritis   . Colon cancer ([facility]) 2017   anal ca/chemo and rad  . Complication of anesthesia   . Constipation   . COPD (chronic obstructive pulmonary disease) ([facility])    also emphysema  . Depression   . Diabetes mellitus without complication ([facility])   .  Dysrhythmia    TACHYCARDIA-WELL CONTROLLED ON METOPROLO  . GERD (gastroesophageal reflux disease)   . H/O allergic rhinitis   . Heart murmur   . Hemorrhoid   . Hyperlipidemia   . Hypertension   . Neck pain, chronic   . Neuritis   . Ovarian failure   . Personal history of chemotherapy   . Personal history of radiation therapy   . PONV (postoperative nausea and vomiting)   . Pre-diabetes 2019   just being followed.  no meds  . Proteinuria   . Psoriasis   . Sleep apnea 09/12/2016   8cm H2O with 21pm oxygen mask: Eson size Medium. Heated humidifier for nasal dryness.  . Tachycardia, paroxysmal ([place])   . Urinary incontinence     Surgical History: Past Surgical History:  Procedure Laterality Date  . ANTERIOR FUSION LUMBAR SPINE  1990   plate in back, not metal  . CARDIAC CATHETERIZATION  2009?    [facility];[place]  . CARDIAC CATHETERIZATION  05/2012   [facility]: Mild hypertension 37/12 with a mean pressure of 21 mm mercury  . CHOLECYSTECTOMY N/A 09/12/2017   Procedure: LAPAROSCOPIC CHOLECYSTECTOMY WITH INTRAOPERATIVE CHOLANGIOGRAM;  Surgeon: Robert Bellow, MD;  Location: [facility] ORS;  Service: General;  Laterality: N/A;  . COLONOSCOPY  2018  . herniated disc repair  2001  . LIVER BIOPSY N/A 09/12/2017   Procedure: LIVER BIOPSY;  Surgeon: Robert Bellow, MD;  Location: [facility] ORS;  Service: General;  Laterality: N/A;  . MASS EXCISION Left 02/23/2016   Procedure: EXCISION MASS;  Surgeon: Christene Lye, MD;  Location: [facility] ORS;  Service: General;  Laterality: Left;  . NECK SURGERY    . PORT-A-CATH REMOVAL  2018  . PORTACATH PLACEMENT Left 03/12/2016   Procedure: INSERTION PORT-A-CATH;  Surgeon: Christene Lye, MD;  Location: [facility] ORS;  Service: General;  Laterality: Left;  . tonsillectomy  1959   history  . TUBAL LIGATION     s/p    Home Medications:  Allergies as of 08/28/2018      Reactions   Dapsone Other (See Comments)   Hypoxemia   Hydrochlorothiazide    History of  hyperparathyroidism    Sulfa Antibiotics Rash   Sulfasalazine Rash      Medication List       Accurate as of August 28, 2018 10:08 AM. Always use your most recent med list.        albuterol 108 (90 Base) MCG/ACT inhaler Commonly known as:  PROVENTIL HFA;VENTOLIN HFA Inhale 2 puffs into the lungs every 6 (six) hours as needed for wheezing or shortness of breath.   betamethasone dipropionate 0.05 % cream Commonly known as:  DIPROLENE Apply 1 application topically 2 (two) times daily as needed.   conjugated estrogens vaginal cream Commonly known as:  PREMARIN Place 0.5 Applicatorfuls vaginally every 3 (three) days. Also apply externally to urethra every 3 days   divalproex 250 MG DR tablet Commonly known as:  DEPAKOTE Take 1 tablet (250 mg total) by mouth at bedtime.   fluticasone 50 MCG/ACT nasal spray Commonly known as:  FLONASE Place 2 sprays into the nose daily.   gabapentin 300 MG capsule Commonly known as:  NEURONTIN TAKE 1 CAPSULE BY MOUTH THREE TIMES DAILY   gemfibrozil 600 MG tablet Commonly known as:  LOPID Take 1 tablet by mouth 2 (two) times daily.   HYDROcodone-acetaminophen 10-325 MG tablet Commonly known as:  NORCO Take 1 tablet by mouth 3 (three) times daily as needed for severe pain.   hydrocortisone 2.5 % cream Apply 1 application topically 2 (two) times daily.   hydrOXYzine 25 MG tablet Commonly known as:  ATARAX/VISTARIL TK 1 T PO QHS   Icosapent Ethyl 1 g Caps Commonly known as:  VASCEPA Take 2 capsules (2 g total) by mouth 2 (two) times daily.   ketoconazole 2 % cream Commonly known as:  NIZORAL Apply 1 application topically 2 (two) times daily.   losartan 100 MG tablet Commonly known as:  COZAAR Take 1 tablet (100 mg total) by mouth daily.   meloxicam 15 MG tablet Commonly known as:  MOBIC TK 1 T PO QD   metFORMIN 500 MG 24 hr tablet Commonly known as:  GLUCOPHAGE-XR Take 2 tablets by mouth daily.   metoprolol succinate 50 MG  24 hr tablet Commonly known as:  TOPROL-XL Take 1 tablet (50 mg total) by mouth daily.   mirabegron ER 50 MG Tb24 tablet Commonly known as:  MYRBETRIQ Take 1 tablet (50 mg total) by mouth daily.   omega-3 acid ethyl esters 1 g capsule Commonly known as:  LOVAZA Take 2 capsules (2 g total) by mouth 2 (two) times daily.   omeprazole 40 MG capsule Commonly known as:  PRILOSEC Take 1 capsule (40 mg total) by mouth daily.   oxybutynin 10 MG 24 hr tablet Commonly known as:  DITROPAN-XL oxybutynin chloride ER 10 mg tablet,extended release 24 hr  TAKE 1 TABLET BY MOUTH DAILY   oxyCODONE-acetaminophen 5-325 MG tablet Commonly known as:  PERCOCET/ROXICET oxycodone-acetaminophen 5 mg-325 mg tablet  TAKE 1 TABLET BY MOUTH EVERY 4 (FOUR) HOURS AS NEEDED FOR SEVERE PAIN.   OXYGEN Inhale 2 L into the lungs continuous.   tiotropium 18 MCG inhalation capsule Commonly known as:  SPIRIVA Place 18 mcg into inhaler and inhale daily. [facility]   tiZANidine 4 MG tablet Commonly known as:  ZANAFLEX Take 1 tablet (4 mg total) by mouth every 8 (eight) hours as needed.   tolterodine 4 MG 24 hr capsule Commonly known as:  DETROL LA tolterodine ER 4 mg capsule,extended release 24 hr  TAKE ONE CAPSULE BY MOUTH DAILY   triamcinolone cream 0.1 % Commonly known as:  KENALOG APP TOPICALLY AA BID   venlafaxine XR 75 MG 24 hr capsule Commonly known as:  EFFEXOR-XR Take 3 capsules (225 mg total) by mouth daily with breakfast.   Vitamin D (Ergocalciferol) 1.25 MG (50000 UT) Caps capsule Commonly known as:  DRISDOL Take by mouth.       Allergies:  Allergies  Allergen Reactions  . Dapsone Other (See Comments)    Hypoxemia  . Hydrochlorothiazide     History of hyperparathyroidism   . Sulfa Antibiotics Rash  . Sulfasalazine Rash    Family History: Family History  Problem Relation Age of Onset  . Heart attack Mother   . Asthma Mother   . Liver cancer Father   . Breast cancer Maternal Aunt     .  Bladder Cancer Neg Hx   . Kidney cancer Neg Hx     Social History:  reports that she quit smoking about 12 years ago. Her smoking use included cigarettes. She started smoking about 55 years ago. She has a 32.00 pack-year smoking history. She has never used smokeless tobacco. She reports that she does not drink alcohol or use drugs.  ROS:                                        Physical Exam: LMP 03/04/2002 (Approximate)   Constitutional:  Alert and oriented, No acute distress.  Laboratory Data: Lab Results  Component Value Date   WBC 7.8 08/03/2017   HGB 12.8 08/03/2017   HCT 37.0 08/03/2017   MCV 88.3 08/03/2017   PLT 262 08/03/2017    Lab Results  Component Value Date   CREATININE 0.90 07/14/2018    No results found for: PSA  No results found for: TESTOSTERONE  Lab Results  Component Value Date   HGBA1C 6.6 (A) 02/27/2018    Urinalysis    Component Value Date/Time   COLORURINE STRAW (A) 11/02/2015 1227   APPEARANCEUR Clear 07/03/2018 1105   LABSPEC 1.008 11/02/2015 1227   LABSPEC 1.005 06/08/2012 1117   PHURINE 7.0 11/02/2015 1227   GLUCOSEU Negative 07/03/2018 1105   GLUCOSEU Negative 06/08/2012 1117   HGBUR NEGATIVE 11/02/2015 1227   BILIRUBINUR Negative 07/03/2018 1105   BILIRUBINUR Negative 06/08/2012 1117   [place] 11/02/2015 1227   PROTEINUR Negative 07/03/2018 1105   PROTEINUR NEGATIVE 11/02/2015 1227   NITRITE Negative 07/03/2018 1105   NITRITE NEGATIVE 11/02/2015 1227   LEUKOCYTESUR Trace (A) 07/03/2018 1105   LEUKOCYTESUR Negative 06/08/2012 1117    Pertinent Imaging:   Assessment & Plan: Myrbetriq renewed.  Still only leaking 1 pad a day.  Clinically not infected.  I will see her in 1 year  1. Gross hematuria  - Urinalysis, Complete   No follow-ups on file.  Reece Packer, MD  [facility] [address] [place], [place] [address] [phone]

## 2018-08-29 ENCOUNTER — Other Ambulatory Visit: Payer: Self-pay

## 2018-08-29 NOTE — Telephone Encounter (Signed)
Prior Authorization was initiated and denied for Lovaza. This medication is not on the formulary and is approved when two alternative mediation on the member's formulary have been tried and did not work. In this case there is only one alternative medication that must be tried: Vascepa.   Please send RX.

## 2018-08-30 ENCOUNTER — Ambulatory Visit: Payer: BLUE CROSS/BLUE SHIELD | Admitting: Family Medicine

## 2018-08-30 MED ORDER — ICOSAPENT ETHYL 1 G PO CAPS: 2.0000 g | ORAL_CAPSULE | Freq: Two times a day (BID) | ORAL | 1 refills | Status: DC

## 2018-08-30 NOTE — Telephone Encounter (Signed)
They denied Vascepa on 1/14, and we already sent gemfibrozil

## 2018-08-30 NOTE — Addendum Note (Signed)
Addended by: Loistine Chance on: 08/30/2018 08:31 AM   Modules accepted: Orders

## 2018-08-31 ENCOUNTER — Ambulatory Visit
Payer: BLUE CROSS/BLUE SHIELD | Attending: Student in an Organized Health Care Education/Training Program | Admitting: Student in an Organized Health Care Education/Training Program

## 2018-08-31 ENCOUNTER — Encounter: Payer: Self-pay | Admitting: Student in an Organized Health Care Education/Training Program

## 2018-08-31 VITALS — BP 153/105 | HR 93 | Temp 98.6°F | Resp 16 | Ht 64.0 in | Wt 187.0 lb

## 2018-08-31 DIAGNOSIS — M542 Cervicalgia: Secondary | ICD-10-CM | POA: Diagnosis not present

## 2018-08-31 DIAGNOSIS — M5412 Radiculopathy, cervical region: Secondary | ICD-10-CM | POA: Insufficient documentation

## 2018-08-31 DIAGNOSIS — M4722 Other spondylosis with radiculopathy, cervical region: Secondary | ICD-10-CM | POA: Diagnosis present

## 2018-08-31 DIAGNOSIS — G8929 Other chronic pain: Secondary | ICD-10-CM | POA: Diagnosis present

## 2018-08-31 DIAGNOSIS — G894 Chronic pain syndrome: Secondary | ICD-10-CM | POA: Diagnosis not present

## 2018-08-31 DIAGNOSIS — M7918 Myalgia, other site: Secondary | ICD-10-CM | POA: Insufficient documentation

## 2018-08-31 MED ORDER — HYDROCODONE-ACETAMINOPHEN 10-325 MG PO TABS: 1.0000 | ORAL_TABLET | Freq: Three times a day (TID) | ORAL | 0 refills | Status: AC | PRN

## 2018-08-31 MED ORDER — HYDROCODONE-ACETAMINOPHEN 10-325 MG PO TABS: 1.0000 | ORAL_TABLET | Freq: Three times a day (TID) | ORAL | 0 refills | Status: DC | PRN

## 2018-08-31 MED ORDER — TIZANIDINE HCL 4 MG PO TABS: 4.0000 mg | ORAL_TABLET | Freq: Three times a day (TID) | ORAL | 2 refills | Status: DC | PRN

## 2018-08-31 NOTE — Progress Notes (Signed)
Patient's Name: Judy Allen  MRN: [id number]  Referring Provider: Steele Sizer, MD  DOB: 1956-03-29  PCP: Steele Sizer, MD  DOS: 08/31/2018  Note by: Gillis Santa, MD  Service setting: Ambulatory outpatient  Specialty: Interventional Pain Management  Location: [facility] (AMB) Pain Management Facility    Patient type: Established   Primary Reason(s) for Visit: Encounter for prescription drug management. (Level of risk: moderate)  CC: Medication Refill (hydrocodone )  HPI  Judy Allen is a 63 y.o. year old, female patient, who comes today for a medication management evaluation. She has Tachycardia, paroxysmal ([place]); Benign hypertension; Cervicalgia; CN (constipation); Gastric reflux; Hypertriglyceridemia; Edema leg; Chronic recurrent major depressive disorder ([place]); Dysmetabolic syndrome; Obstructive apnea; Psoriasis; Allergic rhinitis; Bursitis, trochanteric; GAD (generalized anxiety disorder); History of fusion of cervical spine; Chronic constipation; Non-thrombocytopenic purpura ([place]); Metastatic squamous cell carcinoma ([place]); Anal squamous cell carcinoma ([place]); Abnormal Pap smear of cervix; Centrilobular emphysema ([place]); Chronic respiratory failure with hypoxia, on home oxygen therapy ([place]); Fatty liver; Perihepatitis ([place]); Abnormal liver function test; Myofascial pain syndrome, cervical; Chronic pain syndrome; Cervical facet joint syndrome; History of anal cancer; Cervical radiculopathy (left); and Osteoarthritis of spine with radiculopathy, cervical region on their problem list. Her primarily concern today is the Medication Refill (hydrocodone )  Pain Assessment: Location: Posterior, Mid Neck Radiating: radiates from back of neck up to base of skull. Radiates from back of neck down to shoulder bilateral and left arm.  Onset: More than a month ago Duration: Chronic pain Quality: Tender, Constant, Aching, Sharp Severity: 6 /10 (subjective, self-reported pain score)  Note: Reported level is  inconsistent with clinical observations.                         When using our objective Pain Scale, levels between 6 and 10/10 are said to belong in an emergency room, as it progressively worsens from a 6/10, described as severely limiting, requiring emergency care not usually available at an outpatient pain management facility. At a 6/10 level, communication becomes difficult and requires great effort. Assistance to reach the emergency department may be required. Facial flushing and profuse sweating along with potentially dangerous increases in heart rate and blood pressure will be evident. Effect on ADL: "I have sit down often when cleaning. I have to rest my neck a certain way to ease the pain"  Timing: Constant Modifying factors: Hydrocodone BP: (!) 153/105  HR: 93  Judy Allen was last scheduled for an appointment on 08/19/2018 for medication management. During today's appointment we reviewed Judy Allen's chronic pain status, as well as her outpatient medication regimen.  Worsening neck pain with radiation to her left upper extremity.  Patient does have a history of ACDF.  Discussed T1-T2 ESI.  The patient  reports no history of drug use. Her body mass index is 32.1 kg/m.  Further details on both, my assessment(s), as well as the proposed treatment plan, please see below.  Controlled Substance Pharmacotherapy Assessment REMS (Risk Evaluation and Mitigation Strategy)  Analgesic: Hydrocodone 7.5 mg 3 times daily as needed, quantity 90/month MME/day: 22.5 mg/day.  Judy Napoleon, RN  08/31/2018 12:22 PM  Sign when Signing Visit Nursing Pain Medication Assessment:  Safety precautions to be maintained throughout the outpatient stay will include: orient to surroundings, keep bed in low position, maintain call bell within reach at all times, provide assistance with transfer out of bed and ambulation.  Medication Inspection Compliance: [name] did not comply with our request  to bring her pills  to be counted. She was reminded that bringing the medication bottles, even when empty, is a requirement.  Medication: None brought in. Pill/Patch Count: None available to be counted. Bottle Appearance: No container available. Did not bring bottle(s) to appointment. Filled Date: N/A Last Medication intake:  Yesterday   Pharmacokinetics: Liberation and absorption (onset of action): WNL Distribution (time to peak effect): WNL Metabolism and excretion (duration of action): WNL         Pharmacodynamics: Desired effects: Analgesia: [name] reports >50% benefit. Functional ability: Patient reports that medication allows her to accomplish basic ADLs Clinically meaningful improvement in function (CMIF): Sustained CMIF goals met Perceived effectiveness: Described as relatively effective, allowing for increase in activities of daily living (ADL) Undesirable effects: Side-effects or Adverse reactions: None reported Monitoring: [place] PMP: Online review of the past 27-monthperiod conducted. Compliant with practice rules and regulations Last UDS on record: Summary  Date Value Ref Range Status  09/07/2017 FINAL  Final    Comment:    ==================================================================== TOXASSURE COMP DRUG ANALYSIS,UR ==================================================================== Test                             Result       Flag       Units Drug Present and Declared for Prescription Verification   Norhydrocodone                 189          EXPECTED   ng/mg creat    Norhydrocodone is an expected metabolite of hydrocodone.   Gabapentin                     PRESENT      EXPECTED   Venlafaxine                    PRESENT      EXPECTED   Desmethylvenlafaxine           PRESENT      EXPECTED    Desmethylvenlafaxine is an expected metabolite of venlafaxine.   Acetaminophen                  PRESENT      EXPECTED   Metoprolol                     PRESENT      EXPECTED Drug Absent but  Declared for Prescription Verification   Hydrocodone                    Not Detected UNEXPECTED ng/mg creat    Hydrocodone is almost always present in patients taking this drug    consistently. Absence of hydrocodone could be due to lapse of    time since the last dose or unusual pharmacokinetics (rapid    metabolism).   Tizanidine                     Not Detected UNEXPECTED    Tizanidine, as indicated in the declared medication list, is not    always detected even when used as directed. ==================================================================== Test                      Result    Flag   Units      Ref Range   Creatinine  35               mg/dL      >=20 ==================================================================== Declared Medications:  The flagging and interpretation on this report are based on the  following declared medications.  Unexpected results may arise from  inaccuracies in the declared medications.  **Note: The testing scope of this panel includes these medications:  Gabapentin  Hydrocodone (Hydrocodone-Acetaminophen)  Metoprolol  Venlafaxine  **Note: The testing scope of this panel does not include small to  moderate amounts of these reported medications:  Acetaminophen (Hydrocodone-Acetaminophen)  Tizanidine (Zanaflex)  **Note: The testing scope of this panel does not include following  reported medications:  Albuterol  Betamethasone (Diprolene)  Cyanocobalamin  Divalproex (Depakote)  Hydrochlorothiazide  Hydrocortisone  Ketoconazole (Nizoral)  Omeprazole (Prilosec)  Oxybutynin  Oxygen  Supplement (Omega-3)  Tiotropium (Spiriva)  Tolterodine (Detrol LA) ==================================================================== For clinical consultation, please call [phone]. ====================================================================    UDS interpretation: Compliant          Medication Assessment Form: Reviewed. Patient  indicates being compliant with therapy Treatment compliance: Compliant Risk Assessment Profile: Aberrant behavior: See prior evaluations. None observed or detected today Comorbid factors increasing risk of overdose: See prior notes. No additional risks detected today Opioid risk tool (ORT) (Total Score): 1 Personal History of Substance Abuse (SUD-Substance use disorder):  Alcohol: Negative  Illegal Drugs: Negative  Rx Drugs: Negative  ORT Risk Level calculation: Low Risk Risk of substance use disorder (SUD): Low Opioid Risk Tool - 08/31/18 1221      Family History of Substance Abuse   Alcohol  Negative    Illegal Drugs  Negative    Rx Drugs  Negative      Personal History of Substance Abuse   Alcohol  Negative    Illegal Drugs  Negative    Rx Drugs  Negative      Age   Age between 58-45 years   No      History of Preadolescent Sexual Abuse   History of Preadolescent Sexual Abuse  Negative or Female      Psychological Disease   Psychological Disease  Negative    Depression  Positive      Total Score   Opioid Risk Tool Scoring  1    Opioid Risk Interpretation  Low Risk      ORT Scoring interpretation table:  Score <3 = Low Risk for SUD  Score between 4-7 = Moderate Risk for SUD  Score >8 = High Risk for Opioid Abuse   Risk Mitigation Strategies:  Patient Counseling: Covered Patient-Prescriber Agreement (PPA): Present and active  Notification to other healthcare providers: Done  Pharmacologic Plan: No change in therapy, at this time.             Laboratory Chemistry  Inflammation Markers (CRP: Acute Phase) (ESR: Chronic Phase) No results found for: CRP, ESRSEDRATE, LATICACIDVEN                       Rheumatology Markers No results found for: RF, ANA, LABURIC, URICUR, LYMEIGGIGMAB, LYMEABIGMQN, HLAB27                      Renal Function Markers Lab Results  Component Value Date   BUN 10 12/06/2017   CREATININE 0.90 39/10/90   BCR NOT APPLICABLE 33/00/7622    GFRAA 79 12/06/2017   GFRNONAA 69 12/06/2017  Hepatic Function Markers Lab Results  Component Value Date   AST 39 (H) 12/06/2017   ALT 35 (H) 12/06/2017   ALBUMIN 4.3 03/03/2017   ALKPHOS 53 03/03/2017   LIPASE 27 08/03/2017                        Electrolytes Lab Results  Component Value Date   NA 140 12/06/2017   K 3.9 12/06/2017   CL 100 12/06/2017   CALCIUM 9.8 12/06/2017                        Neuropathy Markers Lab Results  Component Value Date   HGBA1C 6.6 (A) 02/27/2018                        CNS Tests No results found for: COLORCSF, APPEARCSF, RBCCOUNTCSF, WBCCSF, POLYSCSF, LYMPHSCSF, EOSCSF, PROTEINCSF, GLUCCSF, JCVIRUS, CSFOLI, IGGCSF                      Bone Pathology Markers No results found for: VD25OH, [id number], [id number], [id number], 25OHVITD1, 25OHVITD2, 25OHVITD3, TESTOFREE, TESTOSTERONE                       Coagulation Parameters Lab Results  Component Value Date   INR 1.00 09/12/2017   LABPROT 13.1 09/12/2017   APTT 30 09/12/2017   PLT 262 08/03/2017                        Cardiovascular Markers Lab Results  Component Value Date   CKTOTAL 128 06/08/2012   CKMB 2.2 06/08/2012   TROPONINI < 0.02 06/08/2012   HGB 12.8 08/03/2017   HCT 37.0 08/03/2017                         CA Markers No results found for: CEA, CA125, LABCA2                      Note: Lab results reviewed.  Recent Diagnostic Imaging Results  CT HEMATURIA WORKUP CLINICAL DATA:  Intermittent episodes of gross hematuria for approximately 2-3 months.  EXAM: CT ABDOMEN AND PELVIS WITHOUT AND WITH CONTRAST  TECHNIQUE: Multidetector CT imaging of the abdomen and pelvis was performed following the standard protocol before and following the bolus administration of intravenous contrast.  CONTRAST:  110m ISOVUE-300 IOPAMIDOL (ISOVUE-300) INJECTION 61%  COMPARISON:  PET-CT 03/04/2016  FINDINGS: Lower chest: Streaky areas of  subsegmental atelectasis and basilar scarring but no infiltrates, effusions or worrisome pulmonary lesions. The heart is normal in size. No pericardial effusion. The distal esophagus is grossly normal.  Hepatobiliary: No worrisome hepatic lesions. There is mild intra and extrahepatic biliary dilatation likely due to prior cholecystectomy. Focal fatty change noted near the gallbladder fossa.  Pancreas: No mass, inflammation or ductal dilatation.  Spleen: Normal size.  No focal lesions.  Adrenals/Urinary Tract: The adrenal glands are unremarkable.  No renal, ureteral or bladder calculi.  Both kidneys demonstrate normal enhancement/perfusion. A few small scattered low-attenuation lesions are likely benign cysts. No worrisome renal lesions. The delayed images do not demonstrate any significant collecting system abnormalities. Both ureters are normal. The bladder is normal. No asymmetric bladder wall thickening or bladder mass.  Stomach/Bowel: The stomach, duodenum, small bowel and colon are grossly normal without oral contrast. No inflammatory changes, mass lesions or obstructive findings.  The terminal ileum and appendix are normal. Moderate stool throughout the colon and down into the rectum may suggest constipation.  Vascular/Lymphatic: The aorta is normal in caliber. No dissection. Scattered atherosclerotic calcifications. The branch vessels are patent. The major venous structures are patent. No mesenteric or retroperitoneal mass or adenopathy. Small scattered lymph nodes are noted.  Reproductive: The uterus and ovaries are unremarkable.  Other: No pelvic mass or adenopathy. No free pelvic fluid collections. No inguinal mass or adenopathy. No abdominal wall hernia or subcutaneous lesions.  Musculoskeletal: No significant bony findings. Moderate degenerative changes involving the spine.  IMPRESSION: 1. No CT findings to account for the patient's hematuria. No  renal, ureteral or bladder calculi or mass. 2. No acute abdominal/pelvic findings, mass lesions or adenopathy. 3. Status post cholecystectomy with mild intra and extrahepatic biliary dilatation. 4. Mild diffuse fatty infiltration of the liver.  Electronically Signed   By: Marijo Sanes M.D.   On: 07/14/2018 16:57  Complexity Note: Imaging results reviewed. Results shared with Ms. Dacanay, using Layman's terms.                         Meds   Current Outpatient Medications:  .  albuterol (PROVENTIL HFA;VENTOLIN HFA) 108 (90 Base) MCG/ACT inhaler, Inhale 2 puffs into the lungs every 6 (six) hours as needed for wheezing or shortness of breath., Disp: , Rfl:  .  betamethasone dipropionate (DIPROLENE) 0.05 % cream, Apply 1 application topically 2 (two) times daily as needed., Disp: , Rfl:  .  conjugated estrogens (PREMARIN) vaginal cream, Place 0.5 Applicatorfuls vaginally every 3 (three) days. Also apply externally to urethra every 3 days, Disp: 42.5 g, Rfl: 3 .  divalproex (DEPAKOTE) 250 MG DR tablet, Take 1 tablet (250 mg total) by mouth at bedtime., Disp: 90 tablet, Rfl: 1 .  fluticasone (FLONASE) 50 MCG/ACT nasal spray, Place 2 sprays into the nose daily., Disp: , Rfl:  .  gabapentin (NEURONTIN) 300 MG capsule, TAKE 1 CAPSULE BY MOUTH THREE TIMES DAILY, Disp: 90 capsule, Rfl: 4 .  gemfibrozil (LOPID) 600 MG tablet, Take 1 tablet by mouth 2 (two) times daily., Disp: , Rfl:  .  hydrocortisone 2.5 % cream, Apply 1 application topically 2 (two) times daily., Disp: , Rfl:  .  hydrOXYzine (ATARAX/VISTARIL) 25 MG tablet, TK 1 T PO QHS, Disp: , Rfl: 1 .  Icosapent Ethyl (VASCEPA) 1 g CAPS, Take 2 capsules (2 g total) by mouth 2 (two) times daily., Disp: 360 capsule, Rfl: 1 .  ketoconazole (NIZORAL) 2 % cream, Apply 1 application topically 2 (two) times daily., Disp: , Rfl:  .  losartan (COZAAR) 100 MG tablet, Take 1 tablet (100 mg total) by mouth daily., Disp: 90 tablet, Rfl: 1 .  meloxicam (MOBIC)  15 MG tablet, TK 1 T PO QD, Disp: , Rfl:  .  metFORMIN (GLUCOPHAGE-XR) 500 MG 24 hr tablet, Take 2 tablets by mouth daily., Disp: , Rfl: 5 .  metoprolol succinate (TOPROL-XL) 50 MG 24 hr tablet, Take 1 tablet (50 mg total) by mouth daily., Disp: 90 tablet, Rfl: 1 .  mirabegron ER (MYRBETRIQ) 50 MG TB24 tablet, Take 1 tablet (50 mg total) by mouth daily., Disp: 30 tablet, Rfl: 11 .  omeprazole (PRILOSEC) 40 MG capsule, Take 1 capsule (40 mg total) by mouth daily., Disp: 90 capsule, Rfl: 1 .  oxybutynin (DITROPAN-XL) 10 MG 24 hr tablet, oxybutynin chloride ER 10 mg tablet,extended release 24 hr  TAKE 1 TABLET  BY MOUTH DAILY, Disp: , Rfl:  .  OXYGEN, Inhale 2 L into the lungs continuous. , Disp: , Rfl:  .  tiotropium (SPIRIVA) 18 MCG inhalation capsule, Place 18 mcg into inhaler and inhale daily. [facility], Disp: , Rfl:  .  tiZANidine (ZANAFLEX) 4 MG tablet, Take 1 tablet (4 mg total) by mouth every 8 (eight) hours as needed., Disp: 90 tablet, Rfl: 2 .  tolterodine (DETROL LA) 4 MG 24 hr capsule, tolterodine ER 4 mg capsule,extended release 24 hr  TAKE ONE CAPSULE BY MOUTH DAILY, Disp: , Rfl:  .  triamcinolone cream (KENALOG) 0.1 %, APP TOPICALLY AA BID, Disp: , Rfl: 0 .  venlafaxine XR (EFFEXOR-XR) 75 MG 24 hr capsule, Take 3 capsules (225 mg total) by mouth daily with breakfast., Disp: 270 capsule, Rfl: 1 .  Vitamin D, Ergocalciferol, (DRISDOL) 50000 units CAPS capsule, Take by mouth., Disp: , Rfl:  .  HYDROcodone-acetaminophen (NORCO) 10-325 MG tablet, Take 1 tablet by mouth every 8 (eight) hours as needed for up to 30 days for severe pain., Disp: 90 tablet, Rfl: 0 .  [START ON 09/30/2018] HYDROcodone-acetaminophen (NORCO) 10-325 MG tablet, Take 1 tablet by mouth every 8 (eight) hours as needed for up to 30 days for severe pain., Disp: 90 tablet, Rfl: 0 .  [START ON 10/30/2018] HYDROcodone-acetaminophen (NORCO) 10-325 MG tablet, Take 1 tablet by mouth every 8 (eight) hours as needed for up to 30 days for  severe pain., Disp: 90 tablet, Rfl: 0  ROS  Constitutional: Denies any fever or chills Gastrointestinal: No reported hemesis, hematochezia, vomiting, or acute GI distress Musculoskeletal: Denies any acute onset joint swelling, redness, loss of ROM, or weakness Neurological: No reported episodes of acute onset apraxia, aphasia, dysarthria, agnosia, amnesia, paralysis, loss of coordination, or loss of consciousness  Allergies  Ms. Belding is allergic to dapsone; hydrochlorothiazide; sulfa antibiotics; and sulfasalazine.  PFSH  Drug: Ms. Streat  reports no history of drug use. Alcohol:  reports no history of alcohol use. Tobacco:  reports that she quit smoking about 12 years ago. Her smoking use included cigarettes. She started smoking about 55 years ago. She has a 32.00 pack-year smoking history. She has never used smokeless tobacco. Medical:  has a past medical history of Anemia, Anxiety, Arthritis, Colon cancer ([place]) (7622), Complication of anesthesia, Constipation, COPD (chronic obstructive pulmonary disease) ([place]), Depression, Diabetes mellitus without complication ([place]), Dysrhythmia, GERD (gastroesophageal reflux disease), H/O allergic rhinitis, Heart murmur, Hemorrhoid, Hyperlipidemia, Hypertension, Neck pain, chronic, Neuritis, Ovarian failure, Personal history of chemotherapy, Personal history of radiation therapy, PONV (postoperative nausea and vomiting), Pre-diabetes (2019), Proteinuria, Psoriasis, Sleep apnea (09/12/2016), Tachycardia, paroxysmal ([place]), and Urinary incontinence. Surgical: Ms. Mink  has a past surgical history that includes Tubal ligation; tonsillectomy (1959); herniated disc repair (2001); Anterior fusion lumbar spine (1990); Neck surgery; Mass excision (Left, 02/23/2016); Colonoscopy (2018); Portacath placement (Left, 03/12/2016); Cardiac catheterization (2009? ); Cardiac catheterization (05/2012); Port-a-cath removal (2018); Liver biopsy (N/A, 09/12/2017); and Cholecystectomy  (N/A, 09/12/2017). Family: family history includes Asthma in her mother; Breast cancer in her maternal aunt; Heart attack in her mother; Liver cancer in her father.  Constitutional Exam  General appearance: Well nourished, well developed, and well hydrated. In no apparent acute distress Vitals:   08/31/18 1215  BP: (!) 153/105  Pulse: 93  Resp: 16  Temp: 98.6 F (37 C)  SpO2: 94%  Weight: 187 lb (84.8 kg)  Height: '5\' 4"'  (1.626 m)   BMI Assessment: Estimated body mass index is 32.1 kg/m  as calculated from the following:   Height as of this encounter: '5\' 4"'  (1.626 m).   Weight as of this encounter: 187 lb (84.8 kg).  BMI interpretation table: BMI level Category Range association with higher incidence of chronic pain  <18 kg/m2 Underweight   18.5-24.9 kg/m2 Ideal body weight   25-29.9 kg/m2 Overweight Increased incidence by 20%  30-34.9 kg/m2 Obese (Class I) Increased incidence by 68%  35-39.9 kg/m2 Severe obesity (Class II) Increased incidence by 136%  >40 kg/m2 Extreme obesity (Class III) Increased incidence by 254%   Patient's current BMI Ideal Body weight  Body mass index is 32.1 kg/m. Ideal body weight: 54.7 kg (120 lb 9.5 oz) Adjusted ideal body weight: 66.7 kg (147 lb 2.5 oz)   BMI Readings from Last 4 Encounters:  08/31/18 32.10 kg/m  08/28/18 32.12 kg/m  07/03/18 31.33 kg/m  05/30/18 31.02 kg/m   Wt Readings from Last 4 Encounters:  08/31/18 187 lb (84.8 kg)  08/28/18 187 lb 1.6 oz (84.9 kg)  07/03/18 182 lb 8 oz (82.8 kg)  05/30/18 180 lb 11.2 oz (82 kg)  Psych/Mental status: Alert, oriented x 3 (person, place, & time)       Eyes: PERLA Respiratory: No evidence of acute respiratory distress  Cervical Spine Area Exam  Skin & Axial Inspection: Well healed scar from previous spine surgery detected Alignment: Symmetrical Functional ROM: Decreased ROM, to the left Stability: No instability detected Muscle Tone/Strength: Functionally intact. No obvious  neuro-muscular anomalies detected. Sensory (Neurological): Dermatomal pain pattern Palpation: No palpable anomalies              Upper Extremity (UE) Exam    Side: Right upper extremity  Side: Left upper extremity  Skin & Extremity Inspection: Skin color, temperature, and hair growth are WNL. No peripheral edema or cyanosis. No masses, redness, swelling, asymmetry, or associated skin lesions. No contractures.  Skin & Extremity Inspection: Skin color, temperature, and hair growth are WNL. No peripheral edema or cyanosis. No masses, redness, swelling, asymmetry, or associated skin lesions. No contractures.  Functional ROM: Unrestricted ROM          Functional ROM: Decreased ROM for shoulder  Muscle Tone/Strength: Functionally intact. No obvious neuro-muscular anomalies detected.  Muscle Tone/Strength: Functionally intact. No obvious neuro-muscular anomalies detected.  Sensory (Neurological): Unimpaired          Sensory (Neurological): Dermatomal pain pattern affecting the shoulder  Palpation: No palpable anomalies              Palpation: No palpable anomalies              Provocative Test(s):  Phalen's test: deferred Tinel's test: deferred Apley's scratch test (touch opposite shoulder):  Action 1 (Across chest): deferred Action 2 (Overhead): deferred Action 3 (LB reach): deferred   Provocative Test(s):  Phalen's test: deferred Tinel's test: deferred Apley's scratch test (touch opposite shoulder):  Action 1 (Across chest): Decreased ROM Action 2 (Overhead): Decreased ROM Action 3 (LB reach): Decreased ROM    Thoracic Spine Area Exam  Skin & Axial Inspection: No masses, redness, or swelling Alignment: Symmetrical Functional ROM: Unrestricted ROM Stability: No instability detected Muscle Tone/Strength: Functionally intact. No obvious neuro-muscular anomalies detected. Sensory (Neurological): Unimpaired Muscle strength & Tone: No palpable anomalies  Lumbar Spine Area Exam  Skin & Axial  Inspection: No masses, redness, or swelling Alignment: Symmetrical Functional ROM: Unrestricted ROM       Stability: No instability detected Muscle Tone/Strength: Functionally intact. No obvious neuro-muscular anomalies  detected. Sensory (Neurological): Unimpaired Palpation: No palpable anomalies       Provocative Tests: Hyperextension/rotation test: deferred today       Lumbar quadrant test (Kemp's test): deferred today       Lateral bending test: deferred today       Patrick's Maneuver: deferred today                   FABER* test: deferred today                   S-I anterior distraction/compression test: deferred today         S-I lateral compression test: deferred today         S-I Thigh-thrust test: deferred today         S-I Gaenslen's test: deferred today         *(Flexion, ABduction and External Rotation)  Gait & Posture Assessment  Ambulation: Unassisted Gait: Relatively normal for age and body habitus Posture: WNL   Lower Extremity Exam    Side: Right lower extremity  Side: Left lower extremity  Stability: No instability observed          Stability: No instability observed          Skin & Extremity Inspection: Skin color, temperature, and hair growth are WNL. No peripheral edema or cyanosis. No masses, redness, swelling, asymmetry, or associated skin lesions. No contractures.  Skin & Extremity Inspection: Skin color, temperature, and hair growth are WNL. No peripheral edema or cyanosis. No masses, redness, swelling, asymmetry, or associated skin lesions. No contractures.  Functional ROM: Unrestricted ROM                  Functional ROM: Unrestricted ROM                  Muscle Tone/Strength: Functionally intact. No obvious neuro-muscular anomalies detected.  Muscle Tone/Strength: Functionally intact. No obvious neuro-muscular anomalies detected.  Sensory (Neurological): Unimpaired        Sensory (Neurological): Unimpaired        DTR: Patellar: deferred today Achilles:  deferred today Plantar: deferred today  DTR: Patellar: deferred today Achilles: deferred today Plantar: deferred today  Palpation: No palpable anomalies  Palpation: No palpable anomalies   Assessment  Primary Diagnosis & Pertinent Problem List: The primary encounter diagnosis was Cervical radiculopathy (left). Diagnoses of Cervicalgia, Myofascial pain syndrome, cervical, Chronic pain syndrome, Osteoarthritis of spine with radiculopathy, cervical region, and Chronic cervical pain were also pertinent to this visit.  Status Diagnosis  Having a Flare-up Having a Flare-up Persistent 1. Cervical radiculopathy (left)   2. Cervicalgia   3. Myofascial pain syndrome, cervical   4. Chronic pain syndrome   5. Osteoarthritis of spine with radiculopathy, cervical region   6. Chronic cervical pain     Problems updated and reviewed during this visit: Problem  Cervical radiculopathy (left)  Osteoarthritis of Spine With Radiculopathy, Cervical Region  Cervicalgia   UDS today, refill of tizanidine and hydrocodone as below.  No change in dose.  Continue gabapentin as prescribed.   Left C7-T1 ESI for symptoms of left cervical radiculopathy.  Risks and benefits of this procedure were discussed with patient and patient would like to proceed.  Interventional history: C4, C5, C6, C7 cervical facet medial branch nerve blocks which were effective, cervical trigger point injections which were not very effective  Plan of Care  Pharmacotherapy (Medications Ordered): Meds ordered this encounter  Medications  . tiZANidine (ZANAFLEX) 4 MG  tablet    Sig: Take 1 tablet (4 mg total) by mouth every 8 (eight) hours as needed.    Dispense:  90 tablet    Refill:  2  . HYDROcodone-acetaminophen (NORCO) 10-325 MG tablet    Sig: Take 1 tablet by mouth every 8 (eight) hours as needed for up to 30 days for severe pain.    Dispense:  90 tablet    Refill:  0    Do not place this medication, or any other prescription  from our practice, on "Automatic Refill". Patient may have prescription filled one day early if pharmacy is closed on scheduled refill date.  Marland Kitchen HYDROcodone-acetaminophen (NORCO) 10-325 MG tablet    Sig: Take 1 tablet by mouth every 8 (eight) hours as needed for up to 30 days for severe pain.    Dispense:  90 tablet    Refill:  0    Do not place this medication, or any other prescription from our practice, on "Automatic Refill". Patient may have prescription filled one day early if pharmacy is closed on scheduled refill date.  Marland Kitchen HYDROcodone-acetaminophen (NORCO) 10-325 MG tablet    Sig: Take 1 tablet by mouth every 8 (eight) hours as needed for up to 30 days for severe pain.    Dispense:  90 tablet    Refill:  0    Do not place this medication, or any other prescription from our practice, on "Automatic Refill". Patient may have prescription filled one day early if pharmacy is closed on scheduled refill date.   Lab-work, procedure(s), and/or referral(s): Orders Placed This Encounter  Procedures  . Cervical Epidural Injection  . ToxASSURE Select 13 (MW), Urine     Interventional management options: Planned, scheduled, and/or pending:    Left cervical ESI    Provider-requested follow-up: Return in about 3 months (around 11/30/2018) for Procedure.  Future Appointments  Date Time Provider [place]  09/04/2018 11:00 AM Gillis Santa, MD [facility] None  09/13/2018 10:40 AM Steele Sizer, MD [facility]  11/28/2018  9:45 AM Gillis Santa, MD [facility] None  05/17/2019  9:00 AM Rubie Maid, MD [facility] None  08/27/2019 10:45 AM Bjorn Loser, MD [facility] None    Primary Care Physician: Steele Sizer, MD Location: [facility] Note by: Gillis Santa, M.D Date: 08/31/2018; Time: 3:36 PM  Patient Instructions  Three prescriptions for Hydrocodone and one for Tizanidine were sent to your  pharmacy.  ____________________________________________________________________________________________  Preparing for Procedure with Sedation  Instructions: . Oral Intake: Do not eat or drink anything for at least 8 hours prior to your procedure. . Transportation: Public transportation is not allowed. Bring an adult driver. The driver must be physically present in our waiting room before any procedure can be started. Marland Kitchen Physical Assistance: Bring an adult physically capable of assisting you, in the event you need help. This adult should keep you company at home for at least 6 hours after the procedure. . Blood Pressure Medicine: Take your blood pressure medicine with a sip of water the morning of the procedure. . Blood thinners: Notify our staff if you are taking any blood thinners. Depending on which one you take, there will be specific instructions on how and when to stop it. . Diabetics on insulin: Notify the staff so that you can be scheduled 1st case in the morning. If your diabetes requires high dose insulin, take only  of your normal insulin dose the morning of the procedure and notify the staff that you  have done so. . Preventing infections: Shower with an antibacterial soap the morning of your procedure. . Build-up your immune system: Take 1000 mg of Vitamin C with every meal (3 times a day) the day prior to your procedure. Marland Kitchen Antibiotics: Inform the staff if you have a condition or reason that requires you to take antibiotics before dental procedures. . Pregnancy: If you are pregnant, call and cancel the procedure. . Sickness: If you have a cold, fever, or any active infections, call and cancel the procedure. . Arrival: You must be in the facility at least 30 minutes prior to your scheduled procedure. . Children: Do not bring children with you. . Dress appropriately: Bring dark clothing that you would not mind if they get stained. . Valuables: Do not bring any jewelry or  valuables.  Procedure appointments are reserved for interventional treatments only. Marland Kitchen No Prescription Refills. . No medication changes will be discussed during procedure appointments. . No disability issues will be discussed.  Reasons to call and reschedule or cancel your procedure: (Following these recommendations will minimize the risk of a serious complication.) . Surgeries: Avoid having procedures within 2 weeks of any surgery. (Avoid for 2 weeks before or after any surgery). . Flu Shots: Avoid having procedures within 2 weeks of a flu shots or . (Avoid for 2 weeks before or after immunizations). . Barium: Avoid having a procedure within 7-10 days after having had a radiological study involving the use of radiological contrast. (Myelograms, Barium swallow or enema study). . Heart attacks: Avoid any elective procedures or surgeries for the initial 6 months after a "Myocardial Infarction" (Heart Attack). . Blood thinners: It is imperative that you stop these medications before procedures. Let us know if you if you take any blood thinner.  . Infection: Avoid procedures during or within two weeks of an infection (including chest colds or gastrointestinal problems). Symptoms associated with infections include: Localized redness, fever, chills, night sweats or profuse sweating, burning sensation when voiding, cough, congestion, stuffiness, runny nose, sore throat, diarrhea, nausea, vomiting, cold or Flu symptoms, recent or current infections. It is specially important if the infection is over the area that we intend to treat. Marland Kitchen Heart and lung problems: Symptoms that may suggest an active cardiopulmonary problem include: cough, chest pain, breathing difficulties or shortness of breath, dizziness, ankle swelling, uncontrolled high or unusually low blood pressure, and/or palpitations. If you are experiencing any of these symptoms, cancel your procedure and contact your primary care physician for an  evaluation.  Remember:  Regular Business hours are:  Monday to Thursday 8:00 AM to 4:00 PM  Provider's Schedule: Milinda Pointer, MD:  Procedure days: Tuesday and Thursday 7:30 AM to 4:00 PM  Gillis Santa, MD:  Procedure days: Monday and Wednesday 7:30 AM to 4:00 PM ____________________________________________________________________________________________

## 2018-08-31 NOTE — Progress Notes (Signed)
Nursing Pain Medication Assessment:  Safety precautions to be maintained throughout the outpatient stay will include: orient to surroundings, keep bed in low position, maintain call bell within reach at all times, provide assistance with transfer out of bed and ambulation.  Medication Inspection Compliance: Judy Allen did not comply with our request to bring her pills to be counted. She was reminded that bringing the medication bottles, even when empty, is a requirement.  Medication: None brought in. Pill/Patch Count: None available to be counted. Bottle Appearance: No container available. Did not bring bottle(s) to appointment. Filled Date: N/A Last Medication intake:  Yesterday

## 2018-08-31 NOTE — Patient Instructions (Addendum)
Three prescriptions for Hydrocodone and one for Tizanidine were sent to your pharmacy.  ____________________________________________________________________________________________  Preparing for Procedure with Sedation  Instructions: . Oral Intake: Do not eat or drink anything for at least 8 hours prior to your procedure. . Transportation: Public transportation is not allowed. Bring an adult driver. The driver must be physically present in our waiting room before any procedure can be started. Marland Kitchen Physical Assistance: Bring an adult physically capable of assisting you, in the event you need help. This adult should keep you company at home for at least 6 hours after the procedure. . Blood Pressure Medicine: Take your blood pressure medicine with a sip of water the morning of the procedure. . Blood thinners: Notify our staff if you are taking any blood thinners. Depending on which one you take, there will be specific instructions on how and when to stop it. . Diabetics on insulin: Notify the staff so that you can be scheduled 1st case in the morning. If your diabetes requires high dose insulin, take only  of your normal insulin dose the morning of the procedure and notify the staff that you have done so. . Preventing infections: Shower with an antibacterial soap the morning of your procedure. . Build-up your immune system: Take 1000 mg of Vitamin C with every meal (3 times a day) the day prior to your procedure. Marland Kitchen Antibiotics: Inform the staff if you have a condition or reason that requires you to take antibiotics before dental procedures. . Pregnancy: If you are pregnant, call and cancel the procedure. . Sickness: If you have a cold, fever, or any active infections, call and cancel the procedure. . Arrival: You must be in the facility at least 30 minutes prior to your scheduled procedure. . Children: Do not bring children with you. . Dress appropriately: Bring dark clothing that you would not mind  if they get stained. . Valuables: Do not bring any jewelry or valuables.  Procedure appointments are reserved for interventional treatments only. Marland Kitchen No Prescription Refills. . No medication changes will be discussed during procedure appointments. . No disability issues will be discussed.  Reasons to call and reschedule or cancel your procedure: (Following these recommendations will minimize the risk of a serious complication.) . Surgeries: Avoid having procedures within 2 weeks of any surgery. (Avoid for 2 weeks before or after any surgery). . Flu Shots: Avoid having procedures within 2 weeks of a flu shots or . (Avoid for 2 weeks before or after immunizations). . Barium: Avoid having a procedure within 7-10 days after having had a radiological study involving the use of radiological contrast. (Myelograms, Barium swallow or enema study). . Heart attacks: Avoid any elective procedures or surgeries for the initial 6 months after a "Myocardial Infarction" (Heart Attack). . Blood thinners: It is imperative that you stop these medications before procedures. Let us know if you if you take any blood thinner.  . Infection: Avoid procedures during or within two weeks of an infection (including chest colds or gastrointestinal problems). Symptoms associated with infections include: Localized redness, fever, chills, night sweats or profuse sweating, burning sensation when voiding, cough, congestion, stuffiness, runny nose, sore throat, diarrhea, nausea, vomiting, cold or Flu symptoms, recent or current infections. It is specially important if the infection is over the area that we intend to treat. Marland Kitchen Heart and lung problems: Symptoms that may suggest an active cardiopulmonary problem include: cough, chest pain, breathing difficulties or shortness of breath, dizziness, ankle swelling, uncontrolled high or unusually  low blood pressure, and/or palpitations. If you are experiencing any of these symptoms, cancel your  procedure and contact your primary care physician for an evaluation.  Remember:  Regular Business hours are:  Monday to Thursday 8:00 AM to 4:00 PM  Provider's Schedule: Milinda Pointer, MD:  Procedure days: Tuesday and Thursday 7:30 AM to 4:00 PM  Gillis Santa, MD:  Procedure days: Monday and Wednesday 7:30 AM to 4:00 PM ____________________________________________________________________________________________

## 2018-09-04 ENCOUNTER — Ambulatory Visit
Admission: RE | Admit: 2018-09-04 | Discharge: 2018-09-04 | Disposition: A | Discharge status: Home / Self Care | Payer: BLUE CROSS/BLUE SHIELD | Source: Ambulatory Visit | Attending: Student in an Organized Health Care Education/Training Program | Admitting: Student in an Organized Health Care Education/Training Program

## 2018-09-04 ENCOUNTER — Ambulatory Visit (HOSPITAL_BASED_OUTPATIENT_CLINIC_OR_DEPARTMENT_OTHER): Payer: BLUE CROSS/BLUE SHIELD | Admitting: Student in an Organized Health Care Education/Training Program

## 2018-09-04 ENCOUNTER — Encounter: Payer: Self-pay | Admitting: Student in an Organized Health Care Education/Training Program

## 2018-09-04 VITALS — BP 153/90 | HR 85 | Temp 98.0°F | Resp 18 | Ht 64.0 in | Wt 187.0 lb

## 2018-09-04 DIAGNOSIS — M5412 Radiculopathy, cervical region: Secondary | ICD-10-CM | POA: Insufficient documentation

## 2018-09-04 MED ORDER — LACTATED RINGERS IV SOLN
1000.0000 mL | Freq: Once | INTRAVENOUS | Status: AC
  Administered 2018-09-04: 1000 mL via INTRAVENOUS

## 2018-09-04 MED ORDER — ROPIVACAINE HCL 2 MG/ML IJ SOLN
1.0000 mL | Freq: Once | INTRAMUSCULAR | Status: AC
  Administered 2018-09-04: 10 mL via EPIDURAL
  Filled 2018-09-04: qty 10

## 2018-09-04 MED ORDER — FENTANYL CITRATE (PF) 100 MCG/2ML IJ SOLN
25.0000 ug | INTRAMUSCULAR | Status: DC | PRN
  Filled 2018-09-04: qty 2

## 2018-09-04 MED ORDER — SODIUM CHLORIDE 0.9% FLUSH
1.0000 mL | Freq: Once | INTRAVENOUS | Status: AC
  Administered 2018-09-04: 10 mL

## 2018-09-04 MED ORDER — LIDOCAINE HCL 2 % IJ SOLN
10.0000 mL | Freq: Once | INTRAMUSCULAR | Status: AC
  Administered 2018-09-04: 400 mg
  Filled 2018-09-04: qty 20

## 2018-09-04 MED ORDER — IOPAMIDOL (ISOVUE-M 200) INJECTION 41%
10.0000 mL | Freq: Once | INTRAMUSCULAR | Status: AC
  Administered 2018-09-04: 10 mL via EPIDURAL
  Filled 2018-09-04: qty 10

## 2018-09-04 MED ORDER — DEXAMETHASONE SODIUM PHOSPHATE 10 MG/ML IJ SOLN
10.0000 mg | Freq: Once | INTRAMUSCULAR | Status: AC
  Administered 2018-09-04: 10 mg
  Filled 2018-09-04: qty 1

## 2018-09-04 NOTE — Progress Notes (Signed)
Patient's Name: Judy Allen  MRN: [id number]  Referring Provider: Steele Sizer, MD  DOB: 1956/02/27  PCP: Steele Sizer, MD  DOS: 09/04/2018  Note by: Gillis Santa, MD  Service setting: Ambulatory outpatient  Specialty: Interventional Pain Management  Patient type: Established  Location: [facility] (AMB) Pain Management Facility  Visit type: Interventional Procedure   Primary Reason for Visit: Interventional Pain Management Treatment. CC: Neck Pain (left is worse )  Procedure:          Anesthesia, Analgesia, Anxiolysis:  Type: Diagnostic, Inter-Laminar, Cervical Epidural Steroid Injection  #1  Region: Posterior Cervico-thoracic Region Level: T1-2 Laterality: Left-Sided Paramedial  Type: Moderate (Conscious) Sedation combined with Local Anesthesia Indication(s): Analgesia and Anxiety Route: Intravenous (IV) IV Access: Secured Sedation: Meaningful verbal contact was maintained at all times during the procedure  Local Anesthetic: Lidocaine 1-2%  Position: Prone with head of the table was raised to facilitate breathing.   Indications: 1. Cervical radiculopathy (left)    Pain Score: Pre-procedure: 6 /10 Post-procedure: 0-No pain/10  Pre-op Assessment:  Judy Allen is a 63 y.o. (year old), female patient, seen today for interventional treatment. She  has a past surgical history that includes Tubal ligation; tonsillectomy (8413); herniated disc repair (2001); Anterior fusion lumbar spine (1990); Neck surgery; Mass excision (Left, 02/23/2016); Colonoscopy (2018); Portacath placement (Left, 03/12/2016); Cardiac catheterization (2009? ); Cardiac catheterization (05/2012); Port-a-cath removal (2018); Liver biopsy (N/A, 09/12/2017); and Cholecystectomy (N/A, 09/12/2017). Judy Allen has a current medication list which includes the following prescription(s): albuterol, betamethasone dipropionate, conjugated estrogens, divalproex, fluticasone, gabapentin, gemfibrozil, hydrocodone-acetaminophen,  hydrocodone-acetaminophen, hydrocodone-acetaminophen, hydrocortisone, hydroxyzine, icosapent ethyl, ketoconazole, losartan, meloxicam, metformin, metoprolol succinate, mirabegron er, omeprazole, oxybutynin, oxygen-helium, tiotropium, tizanidine, tolterodine, triamcinolone cream, venlafaxine xr, and vitamin d (ergocalciferol), and the following Facility-Administered Medications: fentanyl. Her primarily concern today is the Neck Pain (left is worse )  Initial Vital Signs:  Pulse/HCG Rate: 85ECG Heart Rate: 85 Temp: 98.1 F (36.7 C) Resp: 16 BP: (!) 147/104 SpO2: 96 %(2 liters of O2)  BMI: Estimated body mass index is 32.1 kg/m as calculated from the following:   Height as of this encounter: 5\' 4"  (1.626 m).   Weight as of this encounter: 187 lb (84.8 kg).  Risk Assessment: Allergies: Reviewed. She is allergic to dapsone; hydrochlorothiazide; sulfa antibiotics; and sulfasalazine.  Allergy Precautions: None required Coagulopathies: Reviewed. None identified.  Blood-thinner therapy: None at this time Active Infection(s): Reviewed. None identified. Judy Allen is afebrile  Site Confirmation: Judy Allen was asked to confirm the procedure and laterality before marking the site Procedure checklist: Completed Consent: Before the procedure and under the influence of no sedative(s), amnesic(s), or anxiolytics, the patient was informed of the treatment options, risks and possible complications. To fulfill our ethical and legal obligations, as recommended by the American Medical Association's Code of Ethics, I have informed the patient of my clinical impression; the nature and purpose of the treatment or procedure; the risks, benefits, and possible complications of the intervention; the alternatives, including doing nothing; the risk(s) and benefit(s) of the alternative treatment(s) or procedure(s); and the risk(s) and benefit(s) of doing nothing. The patient was provided information about the general risks  and possible complications associated with the procedure. These may include, but are not limited to: failure to achieve desired goals, infection, bleeding, organ or nerve damage, allergic reactions, paralysis, and death. In addition, the patient was informed of those risks and complications associated to Spine-related procedures, such as failure to decrease pain; infection (i.e.: Meningitis, epidural or intraspinal abscess);  bleeding (i.e.: epidural hematoma, subarachnoid hemorrhage, or any other type of intraspinal or peri-dural bleeding); organ or nerve damage (i.e.: Any type of peripheral nerve, nerve root, or spinal cord injury) with subsequent damage to sensory, motor, and/or autonomic systems, resulting in permanent pain, numbness, and/or weakness of one or several areas of the body; allergic reactions; (i.e.: anaphylactic reaction); and/or death. Furthermore, the patient was informed of those risks and complications associated with the medications. These include, but are not limited to: allergic reactions (i.e.: anaphylactic or anaphylactoid reaction(s)); adrenal axis suppression; blood sugar elevation that in diabetics may result in ketoacidosis or comma; water retention that in patients with history of congestive heart failure may result in shortness of breath, pulmonary edema, and decompensation with resultant heart failure; weight gain; swelling or edema; medication-induced neural toxicity; particulate matter embolism and blood vessel occlusion with resultant organ, and/or nervous system infarction; and/or aseptic necrosis of one or more joints. Finally, the patient was informed that Medicine is not an exact science; therefore, there is also the possibility of unforeseen or unpredictable risks and/or possible complications that may result in a catastrophic outcome. The patient indicated having understood very clearly. We have given the patient no guarantees and we have made no promises. Enough time was  given to the patient to ask questions, all of which were answered to the patient's satisfaction. Judy Allen has indicated that she wanted to continue with the procedure. Attestation: I, the ordering provider, attest that I have discussed with the patient the benefits, risks, side-effects, alternatives, likelihood of achieving goals, and potential problems during recovery for the procedure that I have provided informed consent. Date  Time: 09/04/2018 10:49 AM  Pre-Procedure Preparation:  Monitoring: As per clinic protocol. Respiration, ETCO2, SpO2, BP, heart rate and rhythm monitor placed and checked for adequate function Safety Precautions: Patient was assessed for positional comfort and pressure points before starting the procedure. Time-out: I initiated and conducted the "Time-out" before starting the procedure, as per protocol. The patient was asked to participate by confirming the accuracy of the "Time Out" information. Verification of the correct person, site, and procedure were performed and confirmed by me, the nursing staff, and the patient. "Time-out" conducted as per Joint Commission's Universal Protocol (UP.01.01.01). Time: 1149  Description of Procedure:          Target Area: For Epidural Steroid injections the target is the interlaminar space, initially targeting the lower border of the superior vertebral body lamina. Approach: Paramedial approach. Area Prepped: Entire PosteriorCervical Region Prepping solution: ChloraPrep (2% chlorhexidine gluconate and 70% isopropyl alcohol) Safety Precautions: Aspiration looking for blood return was conducted prior to all injections. At no point did we inject any substances, as a needle was being advanced. No attempts were made at seeking any paresthesias. Safe injection practices and needle disposal techniques used. Medications properly checked for expiration dates. SDV (single dose vial) medications used. Description of the Procedure: Protocol  guidelines were followed. The procedure needle was introduced through the skin, ipsilateral to the reported pain, and advanced to the target area. Bone was contacted and the needle walked caudad, until the lamina was cleared. The epidural space was identified using "loss-of-resistance technique" with 2-3 ml of PF-NaCl (0.9% NSS), in a 5cc LOR glass syringe. Vitals:   09/04/18 1154 09/04/18 1200 09/04/18 1214 09/04/18 1227  BP: (!) 153/88 (!) 159/92 (!) 150/86 (!) 153/90  Pulse:      Resp: 20 18 18 18   Temp:  98 F (36.7 C)  TempSrc:      SpO2: 94% 96% 93% 93%  Weight:      Height:        Start Time: 1149 hrs. End Time: 1153 hrs. Materials:  Needle(s) Type: Epidural needle Gauge: 17G Length: 3.5-in Medication(s): Please see orders for medications and dosing details. 4 cc solution made of 2 cc of preservative-free saline, 1 cc of 0.2% ropivacaine, 1 cc of Decadron 10 mg/cc. Imaging Guidance (Spinal):          Type of Imaging Technique: Fluoroscopy Guidance (Spinal) Indication(s): Assistance in needle guidance and placement for procedures requiring needle placement in or near specific anatomical locations not easily accessible without such assistance. Exposure Time: Please see nurses notes. Contrast: Before injecting any contrast, we confirmed that the patient did not have an allergy to iodine, shellfish, or radiological contrast. Once satisfactory needle placement was completed at the desired level, radiological contrast was injected. Contrast injected under live fluoroscopy. No contrast complications. See chart for type and volume of contrast used. Fluoroscopic Guidance: I was personally present during the use of fluoroscopy. "Tunnel Vision Technique" used to obtain the best possible view of the target area. Parallax error corrected before commencing the procedure. "Direction-depth-direction" technique used to introduce the needle under continuous pulsed fluoroscopy. Once target was  reached, antero-posterior, oblique, and lateral fluoroscopic projection used confirm needle placement in all planes. Images permanently stored in EMR. Interpretation: I personally interpreted the imaging intraoperatively. Adequate needle placement confirmed in multiple planes. Appropriate spread of contrast into desired area was observed. No evidence of afferent or efferent intravascular uptake. No intrathecal or subarachnoid spread observed. Permanent images saved into the patient's record.  Antibiotic Prophylaxis:   Anti-infectives (From admission, onward)   None     Indication(s): None identified  Post-operative Assessment:  Post-procedure Vital Signs:  Pulse/HCG Rate: 8585 Temp: 98 F (36.7 C) Resp: 18 BP: (!) 153/90 SpO2: 93 %  EBL: None  Complications: No immediate post-treatment complications observed by team, or reported by patient.  Note: The patient tolerated the entire procedure well. A repeat set of vitals were taken after the procedure and the patient was kept under observation following institutional policy, for this type of procedure. Post-procedural neurological assessment was performed, showing return to baseline, prior to discharge. The patient was provided with post-procedure discharge instructions, including a section on how to identify potential problems. Should any problems arise concerning this procedure, the patient was given instructions to immediately contact us, at any time, without hesitation. In any case, we plan to contact the patient by telephone for a follow-up status report regarding this interventional procedure.  Comments:  No additional relevant information. 5 out of 5 strength bilateral upper extremity: Shoulder abduction, elbow flexion, elbow extension, thumb extension.  Plan of Care    Imaging Orders     DG C-Arm 1-60 Min-No Report Procedure Orders    No procedure(s) ordered today    Medications ordered for procedure: Meds ordered this  encounter  Medications  . lactated ringers infusion 1,000 mL  . fentaNYL (SUBLIMAZE) injection 25-100 mcg    Make sure Narcan is available in the pyxis when using this medication. In the event of respiratory depression (RR< 8/min): Titrate NARCAN (naloxone) in increments of 0.1 to 0.2 mg IV at 2-3 minute intervals, until desired degree of reversal.  . iopamidol (ISOVUE-M) 41 % intrathecal injection 10 mL  . dexamethasone (DECADRON) injection 10 mg  . ropivacaine (PF) 2 mg/mL (0.2%) (NAROPIN) injection 1 mL  . sodium  chloride flush (NS) 0.9 % injection 1 mL  . lidocaine (XYLOCAINE) 2 % (with pres) injection 200 mg   Medications administered: We administered lactated ringers, iopamidol, dexamethasone, ropivacaine (PF) 2 mg/mL (0.2%), sodium chloride flush, and lidocaine.  See the medical record for exact dosing, route, and time of administration.  Disposition: Discharge home  Discharge Date & Time: 09/04/2018; 1228 hrs.   Physician-requested Follow-up: Return in about 4 weeks (around 10/02/2018) for Post Procedure Evaluation.  Future Appointments  Date Time Provider [place]  09/13/2018 10:40 AM Steele Sizer, MD [place] [facility]  10/03/2018 11:30 AM Gillis Santa, MD [facility] None  11/28/2018  9:45 AM Gillis Santa, MD [facility] None  05/17/2019  9:00 AM Rubie Maid, MD [facility] None  08/27/2019 10:45 AM Bjorn Loser, MD [facility] None   Primary Care Physician: Steele Sizer, MD Location: [facility] Note by: Gillis Santa, MD Date: 09/04/2018; Time: 1:34 PM  Disclaimer:  Medicine is not an exact science. The only guarantee in medicine is that nothing is guaranteed. It is important to note that the decision to proceed with this intervention was based on the information collected from the patient. The Data and conclusions were drawn from the patient's questionnaire, the interview, and the physical examination. Because the information was provided in  large part by the patient, it cannot be guaranteed that it has not been purposely or unconsciously manipulated. Every effort has been made to obtain as much relevant data as possible for this evaluation. It is important to note that the conclusions that lead to this procedure are derived in large part from the available data. Always take into account that the treatment will also be dependent on availability of resources and existing treatment guidelines, considered by other Pain Management Practitioners as being common knowledge and practice, at the time of the intervention. For Medico-Legal purposes, it is also important to point out that variation in procedural techniques and pharmacological choices are the acceptable norm. The indications, contraindications, technique, and results of the above procedure should only be interpreted and judged by a Board-Certified Interventional Pain Specialist with extensive familiarity and expertise in the same exact procedure and technique.

## 2018-09-04 NOTE — Progress Notes (Signed)
Safety precautions to be maintained throughout the outpatient stay will include: orient to surroundings, keep bed in low position, maintain call bell within reach at all times, provide assistance with transfer out of bed and ambulation.  

## 2018-09-04 NOTE — Patient Instructions (Signed)

## 2018-09-05 ENCOUNTER — Telehealth: Payer: Self-pay

## 2018-09-05 LAB — TOXASSURE SELECT 13 (MW), URINE

## 2018-09-05 NOTE — Telephone Encounter (Signed)
Pt was called , no problems was reported. 

## 2018-09-09 HISTORY — PX: TRIGGER FINGER RELEASE: SHX641

## 2018-09-13 ENCOUNTER — Ambulatory Visit: Payer: BLUE CROSS/BLUE SHIELD | Admitting: Family Medicine

## 2018-09-29 ENCOUNTER — Telehealth: Payer: Self-pay | Admitting: *Deleted

## 2018-09-29 NOTE — Telephone Encounter (Signed)
Called patient and informed her that it was OK for her to be given Tramadol form her hand MD and that we did not want her taking extra medication from [place] for another  Surgery. Told patient she did the right thing calling and notifing [place] of this and it would be documented in her chart. Patient with understandin g.

## 2018-10-03 ENCOUNTER — Ambulatory Visit: Payer: BLUE CROSS/BLUE SHIELD | Admitting: Student in an Organized Health Care Education/Training Program

## 2018-10-14 ENCOUNTER — Other Ambulatory Visit: Payer: Self-pay | Admitting: Student in an Organized Health Care Education/Training Program

## 2018-10-14 DIAGNOSIS — G8929 Other chronic pain: Secondary | ICD-10-CM

## 2018-10-14 DIAGNOSIS — M542 Cervicalgia: Principal | ICD-10-CM

## 2018-10-20 ENCOUNTER — Telehealth: Payer: Self-pay | Admitting: Student in an Organized Health Care Education/Training Program

## 2018-10-20 NOTE — Telephone Encounter (Signed)
No answer, Left message stating that we address some of thes issues on the day of her appt and that she should have enough meds to last. Instructed to call if needed.

## 2018-10-20 NOTE — Telephone Encounter (Signed)
Patient lvmail stating she is almost out of meds, wants to have meds sent in for 90 day supply instead of 30. Gabapentin. Has appt on 11-28-18 at 9:45 for med mgmt, also on 10-26-18 at 9:45 for follow up. Both with Dr. Holley Raring.

## 2018-10-23 ENCOUNTER — Ambulatory Visit (INDEPENDENT_AMBULATORY_CARE_PROVIDER_SITE_OTHER): Payer: BLUE CROSS/BLUE SHIELD | Admitting: Family Medicine

## 2018-10-23 ENCOUNTER — Other Ambulatory Visit: Payer: Self-pay

## 2018-10-23 ENCOUNTER — Encounter: Payer: Self-pay | Admitting: Family Medicine

## 2018-10-23 VITALS — BP 100/68 | HR 87 | Temp 98.0°F | Resp 16 | Ht 64.0 in | Wt 185.0 lb

## 2018-10-23 DIAGNOSIS — I479 Paroxysmal tachycardia, unspecified: Secondary | ICD-10-CM

## 2018-10-23 DIAGNOSIS — G8929 Other chronic pain: Secondary | ICD-10-CM

## 2018-10-23 DIAGNOSIS — I10 Essential (primary) hypertension: Secondary | ICD-10-CM | POA: Diagnosis not present

## 2018-10-23 DIAGNOSIS — D692 Other nonthrombocytopenic purpura: Secondary | ICD-10-CM

## 2018-10-23 DIAGNOSIS — K219 Gastro-esophageal reflux disease without esophagitis: Secondary | ICD-10-CM

## 2018-10-23 DIAGNOSIS — E785 Hyperlipidemia, unspecified: Secondary | ICD-10-CM

## 2018-10-23 DIAGNOSIS — F339 Major depressive disorder, recurrent, unspecified: Secondary | ICD-10-CM | POA: Diagnosis not present

## 2018-10-23 DIAGNOSIS — J301 Allergic rhinitis due to pollen: Secondary | ICD-10-CM

## 2018-10-23 DIAGNOSIS — M542 Cervicalgia: Secondary | ICD-10-CM

## 2018-10-23 DIAGNOSIS — E1169 Type 2 diabetes mellitus with other specified complication: Secondary | ICD-10-CM

## 2018-10-23 DIAGNOSIS — K769 Liver disease, unspecified: Secondary | ICD-10-CM

## 2018-10-23 LAB — LIPID PANEL
Cholesterol: 168 (ref 0–200)
HDL: 32 — AB (ref 35–70)
LDL Cholesterol: 83
Triglycerides: 263 — AB (ref 40–160)

## 2018-10-23 LAB — HEMOGLOBIN A1C: Hemoglobin A1C: 6.3

## 2018-10-23 MED ORDER — METFORMIN HCL ER 500 MG PO TB24: 1000.0000 mg | ORAL_TABLET | Freq: Every day | ORAL | 1 refills | Status: DC

## 2018-10-23 MED ORDER — OMEPRAZOLE 40 MG PO CPDR: 40.0000 mg | DELAYED_RELEASE_CAPSULE | Freq: Every day | ORAL | 1 refills | Status: DC

## 2018-10-23 MED ORDER — FLUTICASONE PROPIONATE 50 MCG/ACT NA SUSP: 2.0000 | Freq: Every day | NASAL | 2 refills | Status: DC

## 2018-10-23 MED ORDER — BUPROPION HCL ER (XL) 150 MG PO TB24: 150.0000 mg | ORAL_TABLET | Freq: Every day | ORAL | 0 refills | Status: DC

## 2018-10-23 MED ORDER — METOPROLOL SUCCINATE ER 50 MG PO TB24: 50.0000 mg | ORAL_TABLET | Freq: Every day | ORAL | 1 refills | Status: DC

## 2018-10-23 MED ORDER — DIVALPROEX SODIUM 250 MG PO DR TAB: 250.0000 mg | DELAYED_RELEASE_TABLET | Freq: Every day | ORAL | 1 refills | Status: DC

## 2018-10-23 MED ORDER — LORATADINE 10 MG PO TABS: 10.0000 mg | ORAL_TABLET | Freq: Every day | ORAL | 1 refills | Status: DC

## 2018-10-23 MED ORDER — LOSARTAN POTASSIUM 100 MG PO TABS: 100.0000 mg | ORAL_TABLET | Freq: Every day | ORAL | 1 refills | Status: DC

## 2018-10-23 NOTE — Progress Notes (Signed)
Name: Judy Allen   MRN: [id number]    DOB: 1956/06/26   Date:10/23/2018       Progress Note  Subjective  Chief Complaint  Chief Complaint  Patient presents with  . Depression  . Diabetes  . Hypertension    HPI  Hypertriglyceridemia: seeing Dr. Gabriel Carina, she is off Lovaza because of cost, but is taking Gemfibrozil, tolerating medication well, she never went for dietician visit. Last triglycerides done 04/2018 improved, down to 200's  HTN: seen by pulmonologist and advised to stop HCTZ because calcium was high, she has hyperparathyroidism, bp has been stable, but lower than usual today and she has noticed mild dizziness ( otherwise no symptoms and she is not sick), on losartan and metoprolol   Hyperparathyroidism: calcium back to normal since off hctz, denies cramps, no kidney stone, seeing Dr. Gabriel Carina   , unchanged.   Emphysema and hypoxemia: on oxygen 2 liters - 24 hours per day, sob is stable, no cough or wheezing at this time, still taking spiriva and albuterol prn / Under the care of pulmonologist at [facility],  Dr. Sheral Apley , next appointment in about two weeks. Discussed need for social isolation because of COVID-9  Major Depression: she has been depressed for many years, got worse after her husband died in January 17, 2018, she bounced back, however lives with [name] - and they have been getting along. She is taking Effexor but still has very high phq9, she has crying spells, feels un-welcomed at home. Discussed counseling or referral to psychiatrist, but she wants to hold off for now.   History of anal cancer: still going to [facility] for follow up, every 6 months for radiation oncologist and every 6 months with oncologist in alternating fashion. Current no pain, occasionally has bleeding when she wipes after voiding  DMII: diagnosed 02/2018 by Dr. Gabriel Carina, hgbA1C was at 6.6% she is now on metformin, tolerating well, and last hgbA1C down to 6.3%. Triglycerides also improved,urine  micro was 50 last Fall . Denies polyphagia, she states she drinks all day because of dry mouth.   Trigger finger: right middle finger, had surgery and is doing better, she is doing OT at home   Chronic pain: under the care of Dr. Holley Raring, unchanged   Patient Active Problem List   Diagnosis Date Noted  . Cervical radiculopathy (left) 08/31/2018  . Osteoarthritis of spine with radiculopathy, cervical region 08/31/2018  . History of anal cancer 05/30/2018  . Myofascial pain syndrome, cervical 05/16/2018  . Chronic pain syndrome 05/16/2018  . Cervical facet joint syndrome 05/16/2018  . Perihepatitis ([place]) 09/27/2017  . Abnormal liver function test 09/27/2017  . Fatty liver 12/22/2016  . Chronic respiratory failure with hypoxia, on home oxygen therapy ([place]) 07/28/2016  . Centrilobular emphysema ([place]) 07/14/2016  . Abnormal Pap smear of cervix 04/14/2016  . Anal squamous cell carcinoma ([place]) 03/09/2016  . Metastatic squamous cell carcinoma ([place]) 02/25/2016  . Non-thrombocytopenic purpura ([place]) 01/12/2016  . Chronic constipation 10/16/2015  . History of fusion of cervical spine 04/02/2015  . Benign hypertension 01/16/2015  . Cervicalgia 01/16/2015  . CN (constipation) 01/16/2015  . Gastric reflux 01/16/2015  . Hypertriglyceridemia 01/16/2015  . Edema leg 01/16/2015  . Chronic recurrent major depressive disorder ([place]) 01/16/2015  . Dysmetabolic syndrome 11/94/1740  . Obstructive apnea 01/16/2015  . Psoriasis 01/16/2015  . Allergic rhinitis 01/16/2015  . Bursitis, trochanteric 01/16/2015  . GAD (generalized anxiety disorder) 01/16/2015  . Tachycardia, paroxysmal ([place])  Past Surgical History:  Procedure Laterality Date  . ANTERIOR FUSION LUMBAR SPINE  1990   plate in back, not metal  . CARDIAC CATHETERIZATION  2009?    [facility];[place]  . CARDIAC CATHETERIZATION  05/2012   [facility]: Mild hypertension 37/12 with a mean pressure of 21 mm mercury  . CHOLECYSTECTOMY N/A 09/12/2017    Procedure: LAPAROSCOPIC CHOLECYSTECTOMY WITH INTRAOPERATIVE CHOLANGIOGRAM;  Surgeon: Robert Bellow, MD;  Location: [facility] ORS;  Service: General;  Laterality: N/A;  . COLONOSCOPY  2018  . herniated disc repair  2001  . LIVER BIOPSY N/A 09/12/2017   Procedure: LIVER BIOPSY;  Surgeon: Robert Bellow, MD;  Location: [facility] ORS;  Service: General;  Laterality: N/A;  . MASS EXCISION Left 02/23/2016   Procedure: EXCISION MASS;  Surgeon: Christene Lye, MD;  Location: [facility] ORS;  Service: General;  Laterality: Left;  . NECK SURGERY    . PORT-A-CATH REMOVAL  2018  . PORTACATH PLACEMENT Left 03/12/2016   Procedure: INSERTION PORT-A-CATH;  Surgeon: Christene Lye, MD;  Location: [facility] ORS;  Service: General;  Laterality: Left;  . tonsillectomy  1959   history  . TRIGGER FINGER RELEASE Right 09/2018   Dr. Sabra Heck   . TUBAL LIGATION     s/p    Family History  Problem Relation Age of Onset  . Heart attack Mother   . Asthma Mother   . Liver cancer Father   . Breast cancer Maternal Aunt   . Bladder Cancer Neg Hx   . Kidney cancer Neg Hx     Social History   Socioeconomic History  . Marital status: Widowed    Spouse name: Automotive engineer  . Number of children: 3  . Years of education: Not on file  . Highest education level: 11th grade  Occupational History  . Not on file  Social Needs  . Financial resource strain: Somewhat hard  . Food insecurity:    Worry: Sometimes true    Inability: Sometimes true  . Transportation needs:    Medical: No    Non-medical: No  Tobacco Use  . Smoking status: Former Smoker    Packs/day: 1.00    Years: 32.00    Pack years: 32.00    Types: Cigarettes    Start date: 08/10/1963    Last attempt to quit: 01/15/2006    Years since quitting: 12.7  . Smokeless tobacco: Never Used  Substance and Sexual Activity  . Alcohol use: No    Alcohol/week: 0.0 standard drinks  . Drug use: No  . Sexual activity: Never    Birth control/protection:  Post-menopausal  Lifestyle  . Physical activity:    Days per week: 0 days    Minutes per session: 0 min  . Stress: Very much  Relationships  . Social connections:    Talks on phone: More than three times a week    Gets together: Once a week    Attends religious service: Never    Active member of club or organization: No    Attends meetings of clubs or organizations: Never    Relationship status: Widowed  . Intimate partner violence:    Fear of current or ex partner: No    Emotionally abused: No    Physically abused: No    Forced sexual activity: No  Other Topics Concern  . Not on file  Social History Narrative   Husband passed away on 01-03-18     Current Outpatient Medications:  .  albuterol (PROVENTIL  HFA;VENTOLIN HFA) 108 (90 Base) MCG/ACT inhaler, Inhale 2 puffs into the lungs every 6 (six) hours as needed for wheezing or shortness of breath., Disp: , Rfl:  .  betamethasone dipropionate (DIPROLENE) 0.05 % cream, Apply 1 application topically 2 (two) times daily as needed., Disp: , Rfl:  .  conjugated estrogens (PREMARIN) vaginal cream, Place 0.5 Applicatorfuls vaginally every 3 (three) days. Also apply externally to urethra every 3 days, Disp: 42.5 g, Rfl: 3 .  divalproex (DEPAKOTE) 250 MG DR tablet, Take 1 tablet (250 mg total) by mouth at bedtime., Disp: 90 tablet, Rfl: 1 .  fluticasone (FLONASE) 50 MCG/ACT nasal spray, Place 2 sprays into both nostrils daily., Disp: 16 g, Rfl: 2 .  gabapentin (NEURONTIN) 300 MG capsule, TAKE 1 CAPSULE BY MOUTH THREE TIMES DAILY, Disp: 90 capsule, Rfl: 4 .  gemfibrozil (LOPID) 600 MG tablet, Take 1 tablet by mouth 2 (two) times daily., Disp: , Rfl:  .  HYDROcodone-acetaminophen (NORCO) 10-325 MG tablet, Take 1 tablet by mouth every 8 (eight) hours as needed for up to 30 days for severe pain., Disp: 90 tablet, Rfl: 0 .  [START ON 10/30/2018] HYDROcodone-acetaminophen (NORCO) 10-325 MG tablet, Take 1 tablet by mouth every 8 (eight) hours as  needed for up to 30 days for severe pain., Disp: 90 tablet, Rfl: 0 .  hydrocortisone 2.5 % cream, Apply 1 application topically 2 (two) times daily., Disp: , Rfl:  .  Icosapent Ethyl (VASCEPA) 1 g CAPS, Take 2 capsules (2 g total) by mouth 2 (two) times daily., Disp: 360 capsule, Rfl: 1 .  ketoconazole (NIZORAL) 2 % cream, Apply 1 application topically 2 (two) times daily., Disp: , Rfl:  .  losartan (COZAAR) 100 MG tablet, Take 1 tablet (100 mg total) by mouth daily., Disp: 90 tablet, Rfl: 1 .  meloxicam (MOBIC) 15 MG tablet, TK 1 T PO QD, Disp: , Rfl:  .  metFORMIN (GLUCOPHAGE-XR) 500 MG 24 hr tablet, Take 2 tablets (1,000 mg total) by mouth daily., Disp: 180 tablet, Rfl: 1 .  metoprolol succinate (TOPROL-XL) 50 MG 24 hr tablet, Take 1 tablet (50 mg total) by mouth daily., Disp: 90 tablet, Rfl: 1 .  mirabegron ER (MYRBETRIQ) 50 MG TB24 tablet, Take 1 tablet (50 mg total) by mouth daily., Disp: 30 tablet, Rfl: 11 .  omeprazole (PRILOSEC) 40 MG capsule, Take 1 capsule (40 mg total) by mouth daily., Disp: 90 capsule, Rfl: 1 .  oxybutynin (DITROPAN-XL) 10 MG 24 hr tablet, oxybutynin chloride ER 10 mg tablet,extended release 24 hr  TAKE 1 TABLET BY MOUTH DAILY, Disp: , Rfl:  .  OXYGEN, Inhale 2 L into the lungs continuous. , Disp: , Rfl:  .  tiotropium (SPIRIVA) 18 MCG inhalation capsule, Place 18 mcg into inhaler and inhale daily. [facility], Disp: , Rfl:  .  tiZANidine (ZANAFLEX) 4 MG tablet, Take 1 tablet (4 mg total) by mouth every 8 (eight) hours as needed., Disp: 90 tablet, Rfl: 2 .  tolterodine (DETROL LA) 4 MG 24 hr capsule, tolterodine ER 4 mg capsule,extended release 24 hr  TAKE ONE CAPSULE BY MOUTH DAILY, Disp: , Rfl:  .  triamcinolone cream (KENALOG) 0.1 %, APP TOPICALLY AA BID, Disp: , Rfl: 0 .  venlafaxine XR (EFFEXOR-XR) 75 MG 24 hr capsule, Take 3 capsules (225 mg total) by mouth daily with breakfast., Disp: 270 capsule, Rfl: 1 .  Vitamin D, Ergocalciferol, (DRISDOL) 50000 units CAPS capsule,  Take by mouth., Disp: , Rfl:  .  loratadine (  CLARITIN) 10 MG tablet, Take 1 tablet (10 mg total) by mouth daily., Disp: 90 tablet, Rfl: 1  Allergies  Allergen Reactions  . Dapsone Other (See Comments)    Hypoxemia  . Hydrochlorothiazide     History of hyperparathyroidism   . Sulfa Antibiotics Rash  . Sulfasalazine Rash    I personally reviewed active problem list, medication list, allergies, family history, social history with the patient/caregiver today.   ROS  Constitutional: Negative for fever or weight change.  Respiratory: Negative for cough, she uses nasal canula oxygen stable.   Cardiovascular: Negative for chest pain or palpitations.  Gastrointestinal: Negative for abdominal pain, no bowel changes.  Musculoskeletal: Negative for gait problem or joint swelling.  Skin: Negative for rash.  Neurological: Negative for dizziness or headache.  No other specific complaints in a complete review of systems (except as listed in HPI above).  Objective  Vitals:   10/23/18 1328  BP: 100/68  Pulse: 87  Resp: 16  Temp: 98 F (36.7 C)  TempSrc: Oral  SpO2: 93%  Weight: 185 lb (83.9 kg)  Height: 5\' 4"  (1.626 m)    Body mass index is 31.76 kg/m.  Physical Exam  Constitutional: Patient appears well-developed and well-nourished. Obese  No distress.  HEENT: head atraumatic, normocephalic, pupils equal and reactive to light, nasal cannula oxygen,  neck supple, throat within normal limits Cardiovascular: Normal rate, regular rhythm and normal heart sounds.  No murmur heard. No BLE edema. Pulmonary/Chest: Effort normal and breath sounds normal. No respiratory distress. Abdominal: Soft.  There is no tenderness. Skin: purpura right arm  Psychiatric: Patient has a normal mood and affect. behavior is normal. Judgment and thought content normal.  Recent Results (from the past 2160 hour(s))  Urinalysis, Complete     Status: Abnormal   Collection Time: 08/28/18  9:58 AM  Result  Value Ref Range   Specific Gravity, UA 1.010 1.005 - 1.030   pH, UA 6.0 5.0 - 7.5   Color, UA Yellow Yellow   Appearance Ur Clear Clear   Leukocytes, UA Trace (A) Negative   Protein, UA Negative Negative/Trace   Glucose, UA Negative Negative   Ketones, UA Negative Negative   RBC, UA Negative Negative   Bilirubin, UA Negative Negative   Urobilinogen, Ur 0.2 0.2 - 1.0 mg/dL   Nitrite, UA Negative Negative   Microscopic Examination See below:   Microscopic Examination     Status: Abnormal   Collection Time: 08/28/18  9:58 AM  Result Value Ref Range   WBC, UA 0-5 0 - 5 /hpf   RBC, UA None seen 0 - 2 /hpf   Epithelial Cells (non renal) None seen 0 - 10 /hpf   Bacteria, UA Few (A) None seen/Few  ToxASSURE Select 13 (MW), Urine     Status: None   Collection Time: 08/31/18  1:11 PM  Result Value Ref Range   Summary FINAL     Comment: ==================================================================== TOXASSURE SELECT 13 (MW) ==================================================================== Test                             Result       Flag       Units Drug Present and Declared for Prescription Verification   Hydromorphone                  353          EXPECTED   ng/mg creat   Norhydrocodone  596          EXPECTED   ng/mg creat    Hydromorphone and norhydrocodone are expected metabolites of    hydrocodone. Sources of hydrocodone are scheduled prescription    medications.  Hydromorphone is also available as a scheduled    prescription medication. Drug Absent but Declared for Prescription Verification   Hydrocodone                    Not Detected UNEXPECTED ng/mg creat    Hydrocodone is almost always present in patients taking this drug    consistently. Absence of hydrocodone could be due to lapse of    time since the last dose or unusual pharmacokinetics (rapid    metabolism). ==== ================================================================ Test                       Result    Flag   Units      Ref Range   Creatinine              74               mg/dL      >=20 ==================================================================== Declared Medications:  The flagging and interpretation on this report are based on the  following declared medications.  Unexpected results may arise from  inaccuracies in the declared medications.  **Note: The testing scope of this panel includes these medications:  Hydrocodone (Norco)  **Note: The testing scope of this panel does not include following  reported medications:  Acetaminophen (Norco)  Albuterol  Betamethasone (Diprolene)  Divalproex (Depakote)  Estrogen (Premarin)  Fluticasone (Flonase)  Gabapentin (Neurontin)  Gemfibrozil (Lopid)  Hydrocortisone  Hydroxyzine  Icosapent Ethyl  Ketoconazole (Nizoral)  Losartan (Cozaar)  Meloxicam (Mobic)  Metformin (Glucophage)  Metoprolol (Toprol)  Mirabegro n (Myrbetriq)  Omeprazole (Prilosec)  Oxybutynin (Ditropan)  Tiotropium (Spiriva)  Tizanidine (Zanaflex)  Tolterodine (Detrol)  Triamcinolone (Kenalog)  Venlafaxine (Effexor)  Vitamin D2 (Drisdol) ==================================================================== For clinical consultation, please call [phone]. ====================================================================      PHQ2/9: Depression screen [facility] 2/9 10/23/2018 08/31/2018 05/30/2018 05/16/2018 02/24/2018  Decreased Interest 3 0 1 0 1  Down, Depressed, Hopeless 2 1 1  0 3  PHQ - 2 Score 5 1 2  0 4  Altered sleeping 1 - 0 - 3  Tired, decreased energy 2 - 1 - 3  Change in appetite 0 - 0 - 0  Feeling bad or failure about yourself  3 - 0 - 0  Trouble concentrating 0 - 0 - 1  Moving slowly or fidgety/restless 0 - 0 - 0  Suicidal thoughts 1 - 0 - 0  PHQ-9 Score 12 - 3 - 11  Difficult doing work/chores Somewhat difficult - Somewhat difficult - Very difficult  Some recent data might be hidden    Fall Risk: Fall Risk   09/04/2018 08/31/2018 05/30/2018 05/16/2018 02/24/2018  Falls in the past year? 0 0 No No No  Number falls in past yr: - 0 - - -  Injury with Fall? - 0 - - -  Follow up - - - - -    Assessment & Plan  1. Chronic recurrent major depressive disorder (HCC)  - divalproex (DEPAKOTE) 250 MG DR tablet; Take 1 tablet (250 mg total) by mouth at bedtime.  Dispense: 90 tablet; Refill: 1 We will add wellbutrin, she is down , no energy and taking high dose effexor, recheck sooner if needed   2. Chronic cervical pain  Keep follow up with Dr. Holley Raring   3. Benign hypertension  - metoprolol succinate (TOPROL-XL) 50 MG 24 hr tablet; Take 1 tablet (50 mg total) by mouth daily.  Dispense: 90 tablet; Refill: 1 - losartan (COZAAR) 100 MG tablet; Take 1 tablet (100 mg total) by mouth daily.  Dispense: 90 tablet; Refill: 1  4. Tachycardia, paroxysmal (HCC)  - metoprolol succinate (TOPROL-XL) 50 MG 24 hr tablet; Take 1 tablet (50 mg total) by mouth daily.  Dispense: 90 tablet; Refill: 1  5. Dyslipidemia associated with type 2 diabetes mellitus (HCC)  - metFORMIN (GLUCOPHAGE-XR) 500 MG 24 hr tablet; Take 2 tablets (1,000 mg total) by mouth daily.  Dispense: 180 tablet; Refill: 1 She has follow up next month with Dr. Gabriel Carina, we will not get her labs today   6. GERD without esophagitis  - omeprazole (PRILOSEC) 40 MG capsule; Take 1 capsule (40 mg total) by mouth daily.  Dispense: 90 capsule; Refill: 1  7. Senile purpura (HCC)  Stable   8. Seasonal allergic rhinitis due to pollen  - fluticasone (FLONASE) 50 MCG/ACT nasal spray; Place 2 sprays into both nostrils daily.  Dispense: 16 g; Refill: 2

## 2018-10-23 NOTE — Patient Instructions (Signed)
Please sent me a mychart message about how wellbutrin is working for you , return sooner if needed   Coronavirus (COVID-19) Are you at risk?  Are you at risk for the Coronavirus (COVID-19)?  To be considered HIGH RISK for Coronavirus (COVID-19), you have to meet the following criteria:  . Traveled to [place], [place], [place], [place] or [place]; or in the [place] to [place], [place], [place], or [place]; and have fever, cough, and shortness of breath within the last 2 weeks of travel OR . Been in close contact with a person diagnosed with COVID-19 within the last 2 weeks and have fever, cough, and shortness of breath . IF YOU DO NOT MEET THESE CRITERIA, YOU ARE CONSIDERED LOW RISK FOR COVID-19.  What to do if you are HIGH RISK for COVID-19?  Marland Kitchen If you are having a medical emergency, call 911. . Seek medical care right away. Before you go to a doctor's office, urgent care or emergency department, call ahead and tell them about your recent travel, contact with someone diagnosed with COVID-19, and your symptoms. You should receive instructions from your physician's office regarding next steps of care.  . When you arrive at healthcare provider, tell the healthcare staff immediately you have returned from visiting [place], [place], [place], [place] or [place]; or traveled in the [place] to [place], [place], [place], or [place]; in the last two weeks or you have been in close contact with a person diagnosed with COVID-19 in the last 2 weeks.   . Tell the health care staff about your symptoms: fever, cough and shortness of breath. . After you have been seen by a medical provider, you will be either: o Tested for (COVID-19) and discharged home on quarantine except to seek medical care if symptoms worsen, and asked to  - Stay home and avoid contact with others until you get your results (4-5 days)  - Avoid travel on public transportation if possible (such as bus, train, or  airplane) or o Sent to the Emergency Department by EMS for evaluation, COVID-19 testing, and possible admission depending on your condition and test results.  What to do if you are LOW RISK for COVID-19?  Reduce your risk of any infection by using the same precautions used for avoiding the common cold or flu:  Marland Kitchen Wash your hands often with soap and warm water for at least 20 seconds.  If soap and water are not readily available, use an alcohol-based hand sanitizer with at least 60% alcohol.  . If coughing or sneezing, cover your mouth and nose by coughing or sneezing into the elbow areas of your shirt or coat, into a tissue or into your sleeve (not your hands). . Avoid shaking hands with others and consider head nods or verbal greetings only. . Avoid touching your eyes, nose, or mouth with unwashed hands.  . Avoid close contact with people who are sick. . Avoid places or events with large numbers of people in one location, like concerts or sporting events. . Carefully consider travel plans you have or are making. . If you are planning any travel outside or inside the [place], visit the CDC's Travelers' Health webpage for the latest health notices. . If you have some symptoms but not all symptoms, continue to monitor at home and seek medical attention if your symptoms worsen. . If you are having a medical emergency, call 911.   [place]  Telehealth / e-Visit: [email or web address]         [facility]: [facility]: [phone]                   [facility]: [phone]

## 2018-10-25 ENCOUNTER — Telehealth: Payer: Self-pay | Admitting: *Deleted

## 2018-10-25 DIAGNOSIS — M542 Cervicalgia: Principal | ICD-10-CM

## 2018-10-25 DIAGNOSIS — G8929 Other chronic pain: Secondary | ICD-10-CM

## 2018-10-25 MED ORDER — TIZANIDINE HCL 4 MG PO TABS: 4.0000 mg | ORAL_TABLET | Freq: Three times a day (TID) | ORAL | 4 refills | Status: DC | PRN

## 2018-10-25 MED ORDER — GABAPENTIN 300 MG PO CAPS: 300.0000 mg | ORAL_CAPSULE | Freq: Three times a day (TID) | ORAL | 4 refills | Status: DC

## 2018-10-25 NOTE — Telephone Encounter (Signed)
Patient asking for 1 month refill on Gabapentin and Zanaflex to be e-scribed to your appointment.

## 2018-10-26 ENCOUNTER — Ambulatory Visit: Payer: BLUE CROSS/BLUE SHIELD | Admitting: Student in an Organized Health Care Education/Training Program

## 2018-11-03 ENCOUNTER — Encounter: Payer: Self-pay | Admitting: Family Medicine

## 2018-11-06 ENCOUNTER — Encounter: Payer: Self-pay | Admitting: Family Medicine

## 2018-11-06 ENCOUNTER — Other Ambulatory Visit: Payer: Self-pay | Admitting: Family Medicine

## 2018-11-06 DIAGNOSIS — E1169 Type 2 diabetes mellitus with other specified complication: Secondary | ICD-10-CM | POA: Insufficient documentation

## 2018-11-06 DIAGNOSIS — E669 Obesity, unspecified: Secondary | ICD-10-CM

## 2018-11-06 DIAGNOSIS — E785 Hyperlipidemia, unspecified: Principal | ICD-10-CM

## 2018-11-06 MED ORDER — BLOOD GLUCOSE METER KIT: PACK | 0 refills | Status: DC

## 2018-11-15 IMAGING — XA DG CHOLANGIOGRAM OPERATIVE
1 series · 4 of 4 positions shown · non-contrast
Comparison: None.

CLINICAL DATA: 61-year-old female with a history of cholelithiasis

EXAM:
INTRAOPERATIVE CHOLANGIOGRAM
TECHNIQUE: Cholangiographic images from the C-arm fluoroscopic device were
submitted for interpretation post-operatively. Please see the
procedural report for the amount of contrast and the fluoroscopy
time utilized.

[Series 7: cont. · 4 of 62 frames shown]
[frame 1/62]
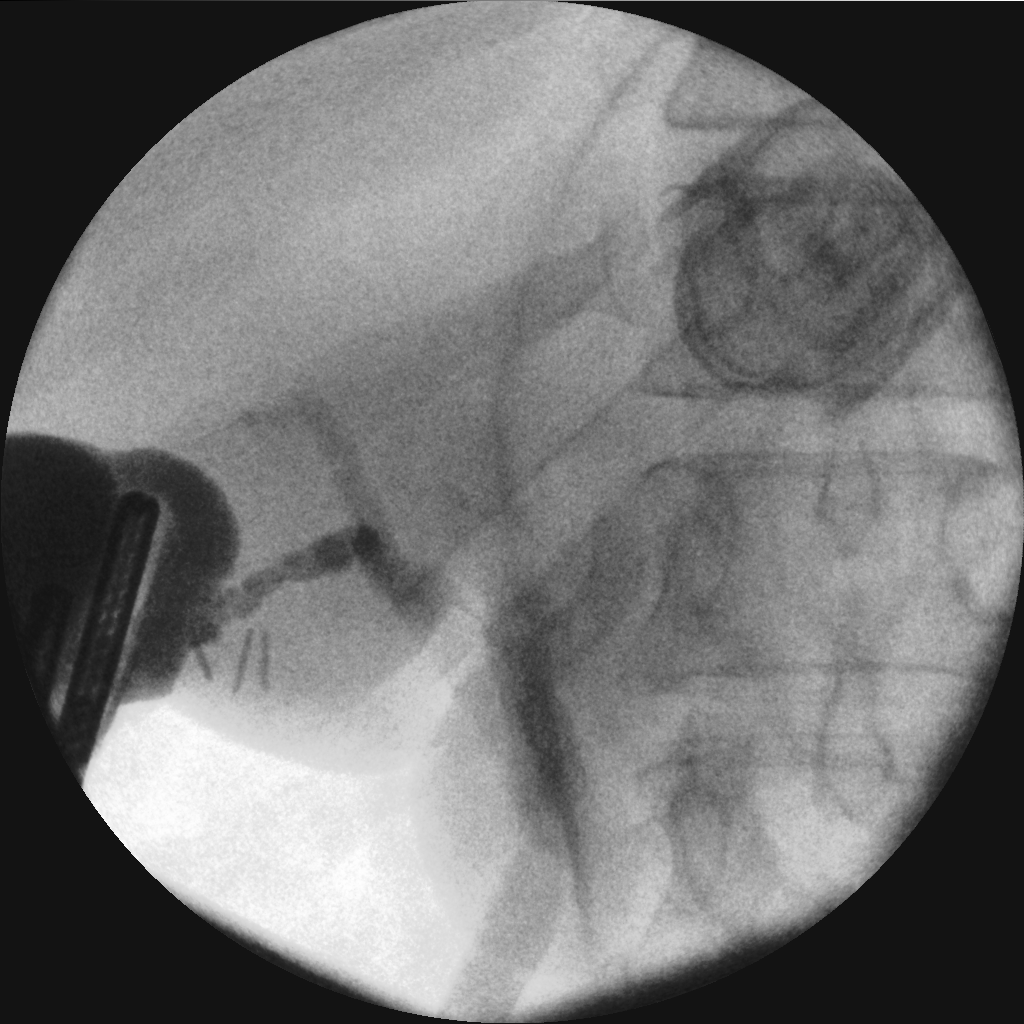
[frame 10/62]
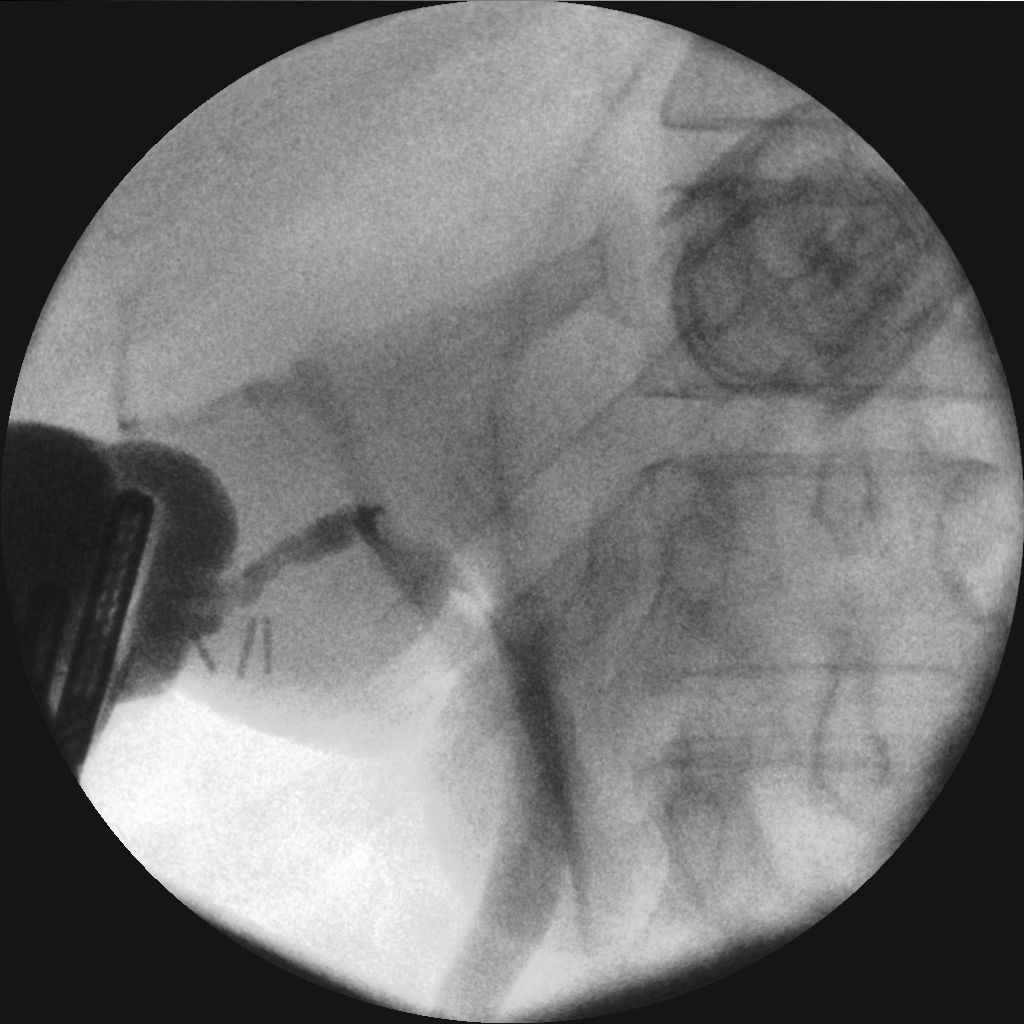
[frame 32/62]
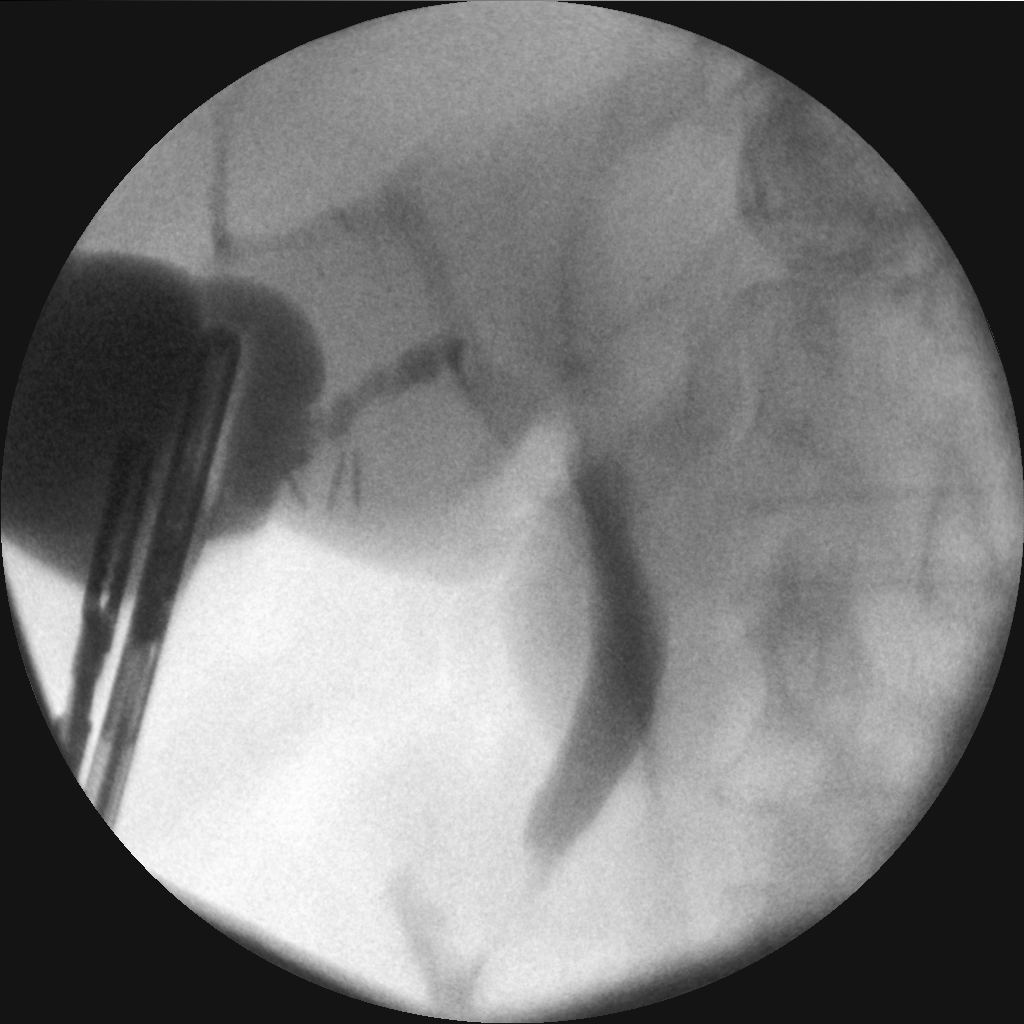
[frame 53/62]
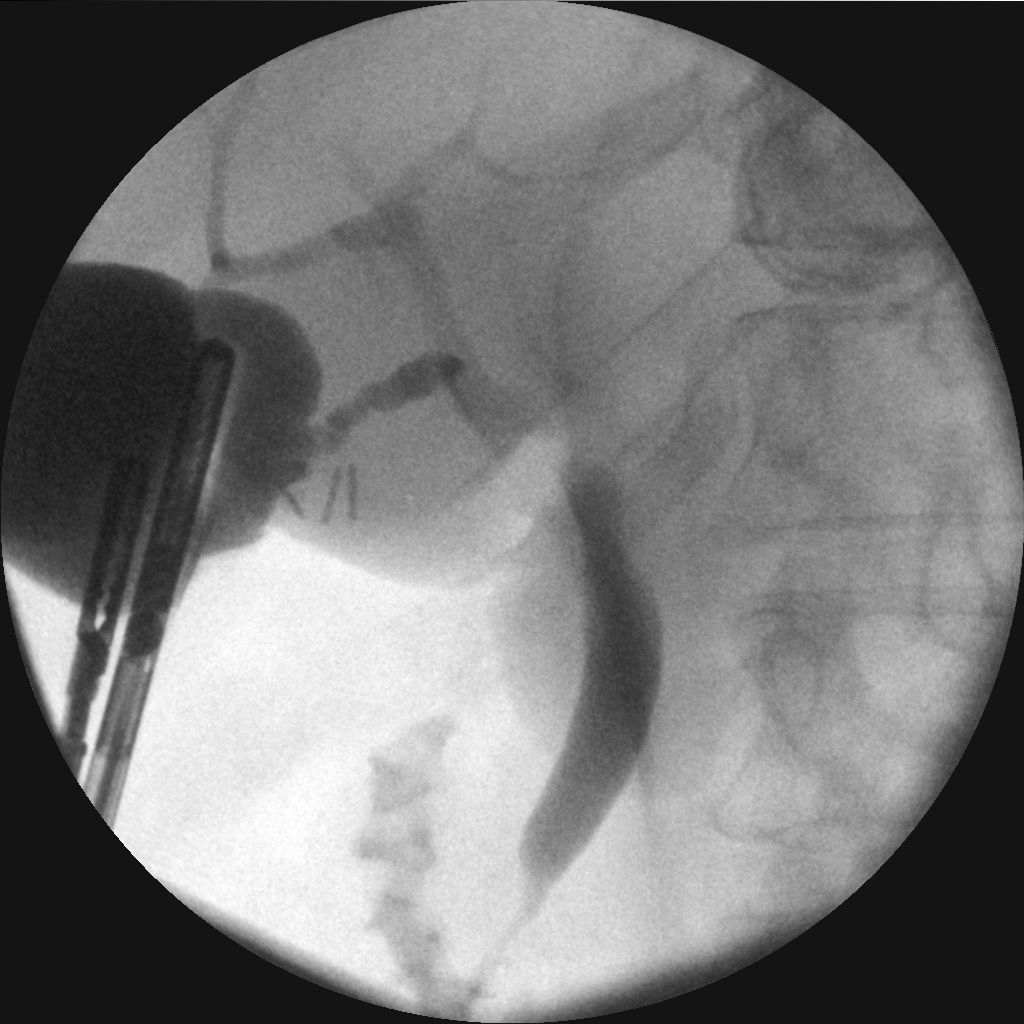

[4 of 4 positions shown; findings below may reference images not displayed]

FINDINGS: Surgical instruments project over the upper abdomen.

There is cannulation of the cystic duct/gallbladder neck, with
antegrade infusion of contrast. Caliber of the extrahepatic ductal
system within normal limits.

No definite filling defect within the extrahepatic ducts identified.

Free flow of contrast across the ampulla.
IMPRESSION: Intraoperative cholangiogram demonstrates extrahepatic biliary ducts
of unremarkable caliber, with no definite filling defects
identified. Free flow of contrast across the ampulla.

Please refer to the dictated operative report for full details of
intraoperative findings and procedure

## 2018-11-17 ENCOUNTER — Other Ambulatory Visit: Payer: Self-pay | Admitting: Family Medicine

## 2018-11-17 DIAGNOSIS — F411 Generalized anxiety disorder: Secondary | ICD-10-CM

## 2018-11-17 DIAGNOSIS — F339 Major depressive disorder, recurrent, unspecified: Secondary | ICD-10-CM

## 2018-11-17 NOTE — Telephone Encounter (Signed)
Refill request for general medication. Effexor to [facility].   Last office visit 10/23/2018   Follow up on 02/06/2019

## 2018-11-28 ENCOUNTER — Ambulatory Visit
Payer: BLUE CROSS/BLUE SHIELD | Attending: Student in an Organized Health Care Education/Training Program | Admitting: Student in an Organized Health Care Education/Training Program

## 2018-11-28 ENCOUNTER — Other Ambulatory Visit: Payer: Self-pay

## 2018-11-28 ENCOUNTER — Other Ambulatory Visit: Payer: Self-pay | Admitting: Family Medicine

## 2018-11-28 DIAGNOSIS — M5412 Radiculopathy, cervical region: Secondary | ICD-10-CM | POA: Diagnosis not present

## 2018-11-28 DIAGNOSIS — G8929 Other chronic pain: Secondary | ICD-10-CM

## 2018-11-28 DIAGNOSIS — G894 Chronic pain syndrome: Secondary | ICD-10-CM | POA: Diagnosis not present

## 2018-11-28 DIAGNOSIS — M7918 Myalgia, other site: Secondary | ICD-10-CM

## 2018-11-28 DIAGNOSIS — M542 Cervicalgia: Secondary | ICD-10-CM

## 2018-11-28 DIAGNOSIS — M47812 Spondylosis without myelopathy or radiculopathy, cervical region: Secondary | ICD-10-CM

## 2018-11-28 MED ORDER — TRAMADOL HCL 50 MG PO TABS: 50.0000 mg | ORAL_TABLET | Freq: Four times a day (QID) | ORAL | 2 refills | Status: DC | PRN

## 2018-11-28 NOTE — Telephone Encounter (Signed)
Request for diabetes medication. Contour Test Strips and Lancets   Last office visit pertaining to diabetes: 10/23/2018   Lab Results  Component Value Date   HGBA1C 6.3 10/23/2018      Follow up on 01/10/2019

## 2018-11-28 NOTE — Progress Notes (Signed)
Pain Management Virtual Encounter Note - Virtual Visit via Telephone Telehealth (real-time audio visits between healthcare provider and patient).  Patient's Phone No. & Preferred Pharmacy:  [phone] (home); There is no such number on file (mobile).; (Preferred) [phone] [email or web address] .[facility] [place], [place] AT [facility] OF [address] & [address] [place] [address] Phone: [phone] Fax: [phone]   Pre-screening note:  Our staff contacted Ms. Aybar and offered her an "in person", "face-to-face" appointment versus a telephone encounter. She indicated preferring the telephone encounter, at this time.  Reason for Virtual Visit: COVID-19*  Social distancing based on CDC and AMA recommendations.   I contacted Judy Allen on 11/28/2018 at 11:48 AM via telephone and clearly identified myself as Gillis Santa, MD. I verified that I was speaking with the correct person using two identifiers (Name and date of birth: 1956/03/09).  Advanced Informed Consent I sought verbal advanced consent from Judy Allen for virtual visit interactions. I informed Ms. Yates of possible security and privacy concerns, risks, and limitations associated with providing "not-in-person" medical evaluation and management services. I also informed Ms. Subia of the availability of "in-person" appointments. Finally, I informed her that there would be a charge for the virtual visit and that she could be  personally, fully or partially, financially responsible for it. Ms. Zinger expressed understanding and agreed to proceed.   Historic Elements   Ms. ORIA KLIMAS is a 63 y.o. year old, female patient evaluated today after her last encounter by our practice on 10/25/2018. Ms. Walthers  has a past medical history of Anemia, Anxiety, Arthritis, Colon cancer ([place]) (5374), Complication of anesthesia, Constipation, COPD (chronic obstructive pulmonary disease) ([place]),  Depression, Diabetes mellitus without complication ([place]), Dysrhythmia, GERD (gastroesophageal reflux disease), H/O allergic rhinitis, Heart murmur, Hemorrhoid, Hyperlipidemia, Hypertension, Neck pain, chronic, Neuritis, Ovarian failure, Personal history of chemotherapy, Personal history of radiation therapy, PONV (postoperative nausea and vomiting), Pre-diabetes (2019), Proteinuria, Psoriasis, Sleep apnea (09/12/2016), Tachycardia, paroxysmal ([place]), and Urinary incontinence. She also  has a past surgical history that includes Tubal ligation; tonsillectomy (8270); herniated disc repair (2001); Anterior fusion lumbar spine (1990); Neck surgery; Mass excision (Left, 02/23/2016); Colonoscopy (2018); Portacath placement (Left, 03/12/2016); Cardiac catheterization (2009? ); Cardiac catheterization (05/2012); Port-a-cath removal (2018); Liver biopsy (N/A, 09/12/2017); Cholecystectomy (N/A, 09/12/2017); and Trigger finger release (Right, 09/2018). Ms. Boxley has a current medication list which includes the following prescription(s): albuterol, betamethasone dipropionate, blood glucose meter kit and supplies, bupropion, conjugated estrogens, divalproex, fluticasone, gabapentin, gemfibrozil, hydrocortisone, icosapent ethyl, ketoconazole, loratadine, losartan, meloxicam, metformin, metoprolol succinate, mirabegron er, omeprazole, oxygen-helium, tiotropium, tizanidine, tolterodine, triamcinolone cream, venlafaxine xr, oxybutynin, tramadol, and vitamin d (ergocalciferol). She  reports that she quit smoking about 12 years ago. Her smoking use included cigarettes. She started smoking about 55 years ago. She has a 32.00 pack-year smoking history. She has never used smokeless tobacco. She reports that she does not drink alcohol or use drugs. Ms. Crutcher is allergic to dapsone; hydrochlorothiazide; sulfa antibiotics; and sulfasalazine.   HPI  I last saw her on 10/20/2018. She is being evaluated for both, medication management and a  post-procedure assessment.  No change in medical history.  Patient is status post a left-sided T1-T2 ESI on 09/04/2018 which she states was not very beneficial for her left-sided radicular pain.  Since her last visit with me, patient has gone on to have trigger finger release of her right hand.  She was  prescribed tramadol for postprocedural pain management.  She states that tramadol was more effective than her current dose of hydrocodone.  She would like to transition to tramadol as her chronic opioid medication as opposed to hydrocodone.  This is reasonable.  Post-Procedure Evaluation  Procedure: left T1/T2 ESI Pre-procedure pain level:  6/10 Post-procedure: 0/10          Sedation: Please see nurses note.  Effectiveness during initial hour after procedure(Ultra-Short Term Relief): 100%  Local anesthetic used: Long-acting (4-6 hours) Effectiveness: Defined as any analgesic benefit obtained secondary to the administration of local anesthetics. This carries significant diagnostic value as to the etiological location, or anatomical origin, of the pain. Duration of benefit is expected to coincide with the duration of the local anesthetic used.  Effectiveness during initial 4-6 hours after procedure(Short-Term Relief): 80%  Long-term benefit: Defined as any relief past the pharmacologic duration of the local anesthetics.  Effectiveness past the initial 6 hours after procedure(Long-Term Relief): 30 %  Current benefits: Defined as benefit that persist at this time.   Analgesia:  No benefit Function: Back to baseline ROM: Back to baseline  Pharmacotherapy Assessment   11/12/2018  1   08/31/2018  Hydrocodone-Acetamin 10-325 MG  90.00 30 Bi Lat   [id number]   [facility] (5798)   0  30.00 MME  Comm Ins   [place]     Monitoring: Pharmacotherapy: No side-effects or adverse reactions reported. [place] PMP: PDMP reviewed during this encounter.       Compliance: No problems identified or detected. Plan: Refer to  "POC".  We will have patient transition from hydrocodone to tramadol since she finds this more effective.  This is a lower MME than her hydrocodone.  Review of recent tests  DG C-Arm 1-60 Min-No Report Fluoroscopy was utilized by the requesting physician.  No radiographic  interpretation.    Abstract on 10/23/2018  Component Date Value Ref Range Status  . Triglycerides 10/23/2018 263* 40 - 160 Final  . Cholesterol 10/23/2018 168  0 - 200 Final  . HDL 10/23/2018 32* 35 - 70 Final  . LDL Cholesterol 10/23/2018 83   Final  . Hemoglobin A1C 10/23/2018 6.3   Final   Assessment  The primary encounter diagnosis was Chronic cervical pain. Diagnoses of Cervical radiculopathy (left), Cervicalgia, Myofascial pain syndrome, cervical, Chronic pain syndrome, Cervical facet joint syndrome, and Spondylosis of cervical region without myelopathy or radiculopathy were also pertinent to this visit.  Plan of Care  I have discontinued Meridith Romick. Hawkes's HYDROcodone-acetaminophen. I am also having her start on traMADol. Additionally, I am having her maintain her OXYGEN, betamethasone dipropionate, hydrocortisone, ketoconazole, albuterol, triamcinolone cream, mirabegron ER, gemfibrozil, tiotropium, conjugated estrogens, Vitamin D (Ergocalciferol), tolterodine, oxybutynin, meloxicam, Icosapent Ethyl, divalproex, fluticasone, metoprolol succinate, losartan, metFORMIN, omeprazole, loratadine, buPROPion, gabapentin, tiZANidine, blood glucose meter kit and supplies, and venlafaxine XR.  Pharmacotherapy (Medications Ordered): Meds ordered this encounter  Medications  . traMADol (ULTRAM) 50 MG tablet    Sig: Take 1 tablet (50 mg total) by mouth every 6 (six) hours as needed for severe pain. Month last 30 days.    Dispense:  120 tablet    Refill:  2    [place] STOP ACT - Not applicable. Fill one day early if pharmacy is closed on scheduled refill date.   Orders:  No orders of the defined types were placed in this  encounter.  Follow-up plan:   Return in about 3 months (around 02/27/2019) for Medication Management.   I discussed  the assessment and treatment plan with the patient. The patient was provided an opportunity to ask questions and all were answered. The patient agreed with the plan and demonstrated an understanding of the instructions.  Patient advised to call back or seek an in-person evaluation if the symptoms or condition worsens.  Total duration of non-face-to-face encounter: 25 minutes.  Note by:  , MD Date: 11/28/2018; Time: 11:48 AM  Disclaimer:  * Given the special circumstances of the COVID-19 pandemic, the federal government has announced that the Office for Civil Rights (OCR) will exercise its enforcement discretion and will not impose penalties on physicians using telehealth in the event of noncompliance with regulatory requirements under the Health Insurance Portability and Accountability Act (HIPAA) in connection with the good faith provision of telehealth during the COVID-19 national public health emergency. (AMA) 

## 2019-01-14 ENCOUNTER — Other Ambulatory Visit: Payer: Self-pay | Admitting: Family Medicine

## 2019-01-14 DIAGNOSIS — F339 Major depressive disorder, recurrent, unspecified: Secondary | ICD-10-CM

## 2019-01-15 ENCOUNTER — Ambulatory Visit: Payer: Medicaid Other | Admitting: Urology

## 2019-01-26 ENCOUNTER — Other Ambulatory Visit: Payer: Self-pay

## 2019-01-26 DIAGNOSIS — R32 Unspecified urinary incontinence: Secondary | ICD-10-CM

## 2019-01-26 DIAGNOSIS — N3946 Mixed incontinence: Secondary | ICD-10-CM

## 2019-01-26 MED ORDER — MIRABEGRON ER 50 MG PO TB24: 50.0000 mg | ORAL_TABLET | Freq: Every day | ORAL | 11 refills | Status: DC

## 2019-02-06 ENCOUNTER — Encounter: Payer: Self-pay | Admitting: Family Medicine

## 2019-02-06 ENCOUNTER — Ambulatory Visit (INDEPENDENT_AMBULATORY_CARE_PROVIDER_SITE_OTHER): Payer: BLUE CROSS/BLUE SHIELD | Admitting: Family Medicine

## 2019-02-06 VITALS — BP 136/90 | HR 73 | Ht 64.0 in | Wt 182.0 lb

## 2019-02-06 DIAGNOSIS — I10 Essential (primary) hypertension: Secondary | ICD-10-CM

## 2019-02-06 DIAGNOSIS — J432 Centrilobular emphysema: Secondary | ICD-10-CM

## 2019-02-06 DIAGNOSIS — E781 Pure hyperglyceridemia: Secondary | ICD-10-CM

## 2019-02-06 DIAGNOSIS — E1169 Type 2 diabetes mellitus with other specified complication: Secondary | ICD-10-CM | POA: Diagnosis not present

## 2019-02-06 DIAGNOSIS — J9611 Chronic respiratory failure with hypoxia: Secondary | ICD-10-CM

## 2019-02-06 DIAGNOSIS — Z9981 Dependence on supplemental oxygen: Secondary | ICD-10-CM

## 2019-02-06 DIAGNOSIS — D692 Other nonthrombocytopenic purpura: Secondary | ICD-10-CM | POA: Diagnosis not present

## 2019-02-06 DIAGNOSIS — F339 Major depressive disorder, recurrent, unspecified: Secondary | ICD-10-CM

## 2019-02-06 DIAGNOSIS — F411 Generalized anxiety disorder: Secondary | ICD-10-CM

## 2019-02-06 DIAGNOSIS — E785 Hyperlipidemia, unspecified: Secondary | ICD-10-CM

## 2019-02-06 DIAGNOSIS — E669 Obesity, unspecified: Secondary | ICD-10-CM

## 2019-02-06 DIAGNOSIS — E213 Hyperparathyroidism, unspecified: Secondary | ICD-10-CM

## 2019-02-06 MED ORDER — BUPROPION HCL ER (XL) 150 MG PO TB24: ORAL_TABLET | ORAL | 1 refills | Status: DC

## 2019-02-06 MED ORDER — VENLAFAXINE HCL ER 75 MG PO CP24: ORAL_CAPSULE | ORAL | 1 refills | Status: DC

## 2019-02-06 MED ORDER — DIVALPROEX SODIUM 250 MG PO DR TAB: 250.0000 mg | DELAYED_RELEASE_TABLET | Freq: Every day | ORAL | 1 refills | Status: DC

## 2019-02-06 NOTE — Patient Instructions (Signed)
She had CBC and comp panel on 02/02/2019

## 2019-02-06 NOTE — Progress Notes (Signed)
Name: Judy Allen   MRN: [id number]    DOB: 20-Jan-1956   Date:02/06/2019       Progress Note  Subjective  Chief Complaint  Chief Complaint  Patient presents with  . Medication Refill  . Diabetes    Checks BS a couple of times a day Average-90's Lowest-60 Highest-119  . Depression  . History of anal cancer  . Hypertriglyceridemia  . Hypertension    Denies any symptoms  . Emphysema  . Pain    I connected with  [name]  on 02/06/19 at  1:20 PM EDT by a video enabled telemedicine application and verified that I am speaking with the correct person using two identifiers.  I discussed the limitations of evaluation and management by telemedicine and the availability of in person appointments. The patient expressed understanding and agreed to proceed. Staff also discussed with the patient that there may be a patient responsible charge related to this service. Patient Location: at home Provider Location: [facility]   HPI  Hypertriglyceridemia: seeing Dr. Gabriel Carina, she is off Lovaza because of cost, but is taking Gemfibrozil, tolerating medication well, she never went for dietician visit. Last triglycerides done 04/2018 improved, down to 200's, she is going back to her office tomorrow to have labs done and has follow up next visit   HTN: seen by pulmonologist and advised to stop HCTZ because calcium was high, she has hyperparathyroidism,bp has been going up and down lately,  it was elevated this morning at home . She is currently  on losartan and metoprolol  Hyperparathyroidism:calcium had gone  back to normal since off hctz, however labs done at [facility] this past week showed elevation again, she has follow up with Dr. Gabriel Carina next week  denies cramps, no kidney stone,   Emphysema and hypoxemia:on oxygen 3 liters - 24 hours per day fort he past month and can got up to 4 liters when moving, sob is stable, no cough or wheezing at this time, still taking spiriva and  albuterol prn / Under the care of pulmonologist at [facility],  Dr. Sheral Apley  Major Depression: she has been depressed for many years, got worse after her husband died in 01/16/18, she bounced back, however lives with [name] - and they have been getting along. She is taking Effexor but still has very high phq9, she has crying spells, feels un-welcomed at home. Discussed counseling or referral to psychiatrist, but she wants to hold off for now.   History of anal cancer: still going to [facility] for follow up, every 6 months for radiation oncologist and every 6 months with oncologist in alternating fashion. Current no pain and rectal bleeding has resolved  DMII: diagnosed 02/2018 by Dr. Gabriel Carina, hgbA1C was at 6.6% she is now on metformin, tolerating well, and last hgbA1C down to 6.3% back in March and has follow up with Dr. Gabriel Carina next Triglycerides also improved,urine micro was 39 last Fall . She has noticed some  polyphagia, she states she drinks all day because of dry mouth, no polyuria  She states her glucose at home has been dropping in the 90's but has gone down to 60's , she may need to stop Metformin   Chronic pain: under the care of Dr. Holley Raring, she states she is doing well on Tramadol, off hydrodocone   Patient Active Problem List   Diagnosis Date Noted  . Diabetes mellitus type 2 in obese ([place]) 11/06/2018  . Cervical radiculopathy (  left) 08/31/2018  . Osteoarthritis of spine with radiculopathy, cervical region 08/31/2018  . History of anal cancer 05/30/2018  . Myofascial pain syndrome, cervical 05/16/2018  . Chronic pain syndrome 05/16/2018  . Cervical facet joint syndrome 05/16/2018  . Perihepatitis ([place]) 09/27/2017  . Abnormal liver function test 09/27/2017  . Fatty liver 12/22/2016  . Chronic respiratory failure with hypoxia, on home oxygen therapy ([place]) 07/28/2016  . Centrilobular emphysema ([place]) 07/14/2016  . Abnormal Pap smear of cervix 04/14/2016  . Anal squamous  cell carcinoma ([place]) 03/09/2016  . Metastatic squamous cell carcinoma ([place]) 02/25/2016  . Non-thrombocytopenic purpura ([place]) 01/12/2016  . Chronic constipation 10/16/2015  . History of fusion of cervical spine 04/02/2015  . Benign hypertension 01/16/2015  . Cervicalgia 01/16/2015  . CN (constipation) 01/16/2015  . Gastric reflux 01/16/2015  . Hypertriglyceridemia 01/16/2015  . Edema leg 01/16/2015  . Chronic recurrent major depressive disorder ([place]) 01/16/2015  . Dysmetabolic syndrome 33/35/4562  . Obstructive apnea 01/16/2015  . Psoriasis 01/16/2015  . Allergic rhinitis 01/16/2015  . Bursitis, trochanteric 01/16/2015  . GAD (generalized anxiety disorder) 01/16/2015  . Tachycardia, paroxysmal [facility])     Past Surgical History:  Procedure Laterality Date  . ANTERIOR FUSION LUMBAR SPINE  1990   plate in back, not metal  . CARDIAC CATHETERIZATION  2009?    [facility]  . CARDIAC CATHETERIZATION  05/2012   RHC: Mild hypertension 37/12 with a mean pressure of 21 mm mercury  . CHOLECYSTECTOMY N/A 09/12/2017   Procedure: LAPAROSCOPIC CHOLECYSTECTOMY WITH INTRAOPERATIVE CHOLANGIOGRAM;  Surgeon: Robert Bellow, MD;  Location: [facility] ORS;  Service: General;  Laterality: N/A;  . COLONOSCOPY  2018  . herniated disc repair  2001  . LIVER BIOPSY N/A 09/12/2017   Procedure: LIVER BIOPSY;  Surgeon: Robert Bellow, MD;  Location: [facility] ORS;  Service: General;  Laterality: N/A;  . MASS EXCISION Left 02/23/2016   Procedure: EXCISION MASS;  Surgeon: Christene Lye, MD;  Location: [facility] ORS;  Service: General;  Laterality: Left;  . NECK SURGERY    . PORT-A-CATH REMOVAL  2018  . PORTACATH PLACEMENT Left 03/12/2016   Procedure: INSERTION PORT-A-CATH;  Surgeon: Christene Lye, MD;  Location: [facility] ORS;  Service: General;  Laterality: Left;  . tonsillectomy  1959   history  . TRIGGER FINGER RELEASE Right 09/2018   Dr. Sabra Heck   . TUBAL LIGATION     s/p    Family History  Problem  Relation Age of Onset  . Heart attack Mother   . Asthma Mother   . Liver cancer Father   . Breast cancer Maternal Aunt   . Bladder Cancer Neg Hx   . Kidney cancer Neg Hx     Social History   Socioeconomic History  . Marital status: Widowed    Spouse name: Automotive engineer  . Number of children: 3  . Years of education: Not on file  . Highest education level: 11th grade  Occupational History  . Not on file  Social Needs  . Financial resource strain: Somewhat hard  . Food insecurity    Worry: Sometimes true    Inability: Sometimes true  . Transportation needs    Medical: No    Non-medical: No  Tobacco Use  . Smoking status: Former Smoker    Packs/day: 1.00    Years: 32.00    Pack years: 32.00    Types: Cigarettes    Start date: 08/10/1963    Quit date: 01/15/2006    Years since quitting:  13.0  . Smokeless tobacco: Never Used  Substance and Sexual Activity  . Alcohol use: No    Alcohol/week: 0.0 standard drinks  . Drug use: No  . Sexual activity: Never    Birth control/protection: Post-menopausal  Lifestyle  . Physical activity    Days per week: 0 days    Minutes per session: 0 min  . Stress: Very much  Relationships  . Social connections    Talks on phone: More than three times a week    Gets together: Once a week    Attends religious service: Never    Active member of club or organization: No    Attends meetings of clubs or organizations: Never    Relationship status: Widowed  . Intimate partner violence    Fear of current or ex partner: No    Emotionally abused: No    Physically abused: No    Forced sexual activity: No  Other Topics Concern  . Not on file  Social History Narrative   Husband passed away on 12/21/2017     Current Outpatient Medications:  .  blood glucose meter kit and supplies, Dispense based on patient and insurance preference. Use up to four times daily as directed. (FOR ICD-10 E10.9, E11.9)., Disp: 1 each, Rfl: 0 .  buPROPion  (WELLBUTRIN XL) 150 MG 24 hr tablet, TAKE 1 TABLET(150 MG) BY MOUTH DAILY, Disp: 90 tablet, Rfl: 1 .  CONTOUR NEXT TEST test strip, USE UP TO FOUR TIMES DAILY AS DIRECTED, Disp: 100 each, Rfl: 5 .  divalproex (DEPAKOTE) 250 MG DR tablet, Take 1 tablet (250 mg total) by mouth at bedtime., Disp: 90 tablet, Rfl: 1 .  fluticasone (FLONASE) 50 MCG/ACT nasal spray, Place 2 sprays into both nostrils daily., Disp: 16 g, Rfl: 2 .  gabapentin (NEURONTIN) 300 MG capsule, Take 1 capsule (300 mg total) by mouth 3 (three) times daily., Disp: 90 capsule, Rfl: 4 .  hydrocortisone 2.5 % cream, Apply 1 application topically 2 (two) times daily., Disp: , Rfl:  .  Icosapent Ethyl (VASCEPA) 1 g CAPS, Take 2 capsules (2 g total) by mouth 2 (two) times daily., Disp: 360 capsule, Rfl: 1 .  ketoconazole (NIZORAL) 2 % cream, Apply 1 application topically 2 (two) times daily., Disp: , Rfl:  .  loratadine (CLARITIN) 10 MG tablet, Take 1 tablet (10 mg total) by mouth daily., Disp: 90 tablet, Rfl: 1 .  losartan (COZAAR) 100 MG tablet, Take 1 tablet (100 mg total) by mouth daily., Disp: 90 tablet, Rfl: 1 .  meloxicam (MOBIC) 15 MG tablet, TK 1 T PO QD, Disp: , Rfl:  .  metFORMIN (GLUCOPHAGE-XR) 500 MG 24 hr tablet, Take 2 tablets (1,000 mg total) by mouth daily., Disp: 180 tablet, Rfl: 1 .  metoprolol succinate (TOPROL-XL) 50 MG 24 hr tablet, Take 1 tablet (50 mg total) by mouth daily., Disp: 90 tablet, Rfl: 1 .  Microlet Lancets MISC, USE FOUR TIMES DAILY, Disp: 100 each, Rfl: 5 .  mirabegron ER (MYRBETRIQ) 50 MG TB24 tablet, Take 1 tablet (50 mg total) by mouth daily., Disp: 30 tablet, Rfl: 11 .  omeprazole (PRILOSEC) 40 MG capsule, Take 1 capsule (40 mg total) by mouth daily., Disp: 90 capsule, Rfl: 1 .  oxybutynin (DITROPAN-XL) 10 MG 24 hr tablet, oxybutynin chloride ER 10 mg tablet,extended release 24 hr  TAKE 1 TABLET BY MOUTH DAILY, Disp: , Rfl:  .  OXYGEN, Inhale 2 L into the lungs continuous. , Disp: , Rfl:  .  tiotropium (SPIRIVA) 18 MCG inhalation capsule, Place 18 mcg into inhaler and inhale daily. [facility], Disp: , Rfl:  .  tiZANidine (ZANAFLEX) 4 MG tablet, Take 1 tablet (4 mg total) by mouth every 8 (eight) hours as needed., Disp: 90 tablet, Rfl: 4 .  traMADol (ULTRAM) 50 MG tablet, Take 1 tablet (50 mg total) by mouth every 6 (six) hours as needed for severe pain. Month last 30 days., Disp: 120 tablet, Rfl: 2 .  triamcinolone cream (KENALOG) 0.1 %, APP TOPICALLY AA BID, Disp: , Rfl: 0 .  venlafaxine XR (EFFEXOR-XR) 75 MG 24 hr capsule, TAKE 3 CAPSULES BY MOUTH DAILY WITH BREAKFAST, Disp: 270 capsule, Rfl: 1 .  Vitamin D, Ergocalciferol, (DRISDOL) 50000 units CAPS capsule, Take by mouth., Disp: , Rfl:  .  albuterol (PROVENTIL HFA;VENTOLIN HFA) 108 (90 Base) MCG/ACT inhaler, Inhale 2 puffs into the lungs every 6 (six) hours as needed for wheezing or shortness of breath., Disp: , Rfl:  .  betamethasone dipropionate (DIPROLENE) 0.05 % cream, Apply 1 application topically 2 (two) times daily as needed., Disp: , Rfl:  .  tolterodine (DETROL LA) 4 MG 24 hr capsule, tolterodine ER 4 mg capsule,extended release 24 hr  TAKE ONE CAPSULE BY MOUTH DAILY, Disp: , Rfl:   Allergies  Allergen Reactions  . Dapsone Other (See Comments)    Hypoxemia  . Hydrochlorothiazide     History of hyperparathyroidism   . Sulfa Antibiotics Rash  . Sulfasalazine Rash    I personally reviewed active problem list, medication list, allergies, family history, social history with the patient/caregiver today.   ROS  Ten systems reviewed and is negative except as mentioned in HPI   Objective  Vitals:   02/06/19 1121 02/06/19 1258  BP: (!) 167/94 136/90  Pulse: 73   Weight: 182 lb (82.6 kg)   Height: '5\' 4"'  (1.626 m)    Body mass index is 31.24 kg/m.  Physical Exam  Awake, alert and oriented, nasal canula oxygen in no distress  PHQ2/9: Depression screen [facility] 2/9 02/06/2019 10/23/2018 08/31/2018 05/30/2018 05/16/2018   Decreased Interest 3 3 0 1 0  Down, Depressed, Hopeless '1 2 1 1 ' 0  PHQ - 2 Score '4 5 1 2 ' 0  Altered sleeping 0 1 - 0 -  Tired, decreased energy 2 2 - 1 -  Change in appetite 0 0 - 0 -  Feeling bad or failure about yourself  1 3 - 0 -  Trouble concentrating 0 0 - 0 -  Moving slowly or fidgety/restless 0 0 - 0 -  Suicidal thoughts 0 1 - 0 -  PHQ-9 Score 7 12 - 3 -  Difficult doing work/chores Not difficult at all Somewhat difficult - Somewhat difficult -  Some recent data might be hidden   PHQ-2/9 Result is positive.    Fall Risk: Fall Risk  02/06/2019 09/04/2018 08/31/2018 05/30/2018 05/16/2018  Falls in the past year? 0 0 0 No No  Number falls in past yr: 0 - 0 - -  Injury with Fall? 0 - 0 - -  Follow up - - - - -     Assessment & Plan  1. Diabetes mellitus type 2 in obese [facility])  Has follow up with Dr. Gabriel Carina next week  2. Chronic recurrent major depressive disorder (HCC)  - venlafaxine XR (EFFEXOR-XR) 75 MG 24 hr capsule; TAKE 3 CAPSULES BY MOUTH DAILY WITH BREAKFAST  Dispense: 270 capsule; Refill: 1 - divalproex (DEPAKOTE) 250 MG DR tablet; Take 1  tablet (250 mg total) by mouth at bedtime.  Dispense: 90 tablet; Refill: 1 - buPROPion (WELLBUTRIN XL) 150 MG 24 hr tablet; TAKE 1 TABLET(150 MG) BY MOUTH DAILY  Dispense: 90 tablet; Refill: 1  Doing better since we added Wellbutrin XL   3. Non-thrombocytopenic purpura (HCC)  Both arms  4. Centrilobular emphysema ([place])  On higher dose of oxygen since last month   5. Chronic respiratory failure with hypoxia, on home oxygen therapy ([place])  Keep follow up with pneumologist   6. Hypertriglyceridemia  She will have labs drawn by Dr. Gabriel Carina tomorrow  7. Benign hypertension  Elevated today, but usually at goal, continue to monitor and current medications for now  8. Hyperparathyroidism ([place])  Last calcium elevated at [facility], she has follow up with Dr. Gabriel Carina tomorrow   9. Dyslipidemia associated with type 2 diabetes mellitus  (HCC)  Glucose is lower than normal for her  10. GAD (generalized anxiety disorder)  - venlafaxine XR (EFFEXOR-XR) 75 MG 24 hr capsule; TAKE 3 CAPSULES BY MOUTH DAILY WITH BREAKFAST  Dispense: 270 capsule; Refill: 1 I discussed the assessment and treatment plan with the patient. The patient was provided an opportunity to ask questions and all were answered. The patient agreed with the plan and demonstrated an understanding of the instructions.  The patient was advised to call back or seek an in-person evaluation if the symptoms worsen or if the condition fails to improve as anticipated.  I provided 25  minutes of non-face-to-face time during this encounter.

## 2019-02-08 ENCOUNTER — Encounter: Payer: Self-pay | Admitting: Family Medicine

## 2019-02-12 ENCOUNTER — Other Ambulatory Visit: Payer: Self-pay | Admitting: Family Medicine

## 2019-02-12 MED ORDER — KETOCONAZOLE 2 % EX CREA: 1.0000 "application " | TOPICAL_CREAM | Freq: Two times a day (BID) | CUTANEOUS | 0 refills | Status: DC

## 2019-02-12 MED ORDER — BETAMETHASONE DIPROPIONATE 0.05 % EX CREA: 1.0000 "application " | TOPICAL_CREAM | Freq: Two times a day (BID) | CUTANEOUS | 0 refills | Status: DC | PRN

## 2019-02-12 MED ORDER — TRIAMCINOLONE ACETONIDE 0.1 % EX CREA: TOPICAL_CREAM | Freq: Two times a day (BID) | CUTANEOUS | 0 refills | Status: DC

## 2019-02-15 ENCOUNTER — Other Ambulatory Visit: Payer: Self-pay | Admitting: Family Medicine

## 2019-02-15 DIAGNOSIS — F339 Major depressive disorder, recurrent, unspecified: Secondary | ICD-10-CM

## 2019-02-15 DIAGNOSIS — F411 Generalized anxiety disorder: Secondary | ICD-10-CM

## 2019-02-19 ENCOUNTER — Encounter: Payer: Self-pay | Admitting: Student in an Organized Health Care Education/Training Program

## 2019-02-20 ENCOUNTER — Other Ambulatory Visit: Payer: Self-pay

## 2019-02-20 ENCOUNTER — Ambulatory Visit
Payer: BLUE CROSS/BLUE SHIELD | Attending: Student in an Organized Health Care Education/Training Program | Admitting: Student in an Organized Health Care Education/Training Program

## 2019-02-20 ENCOUNTER — Encounter: Payer: Self-pay | Admitting: Student in an Organized Health Care Education/Training Program

## 2019-02-20 DIAGNOSIS — M7918 Myalgia, other site: Secondary | ICD-10-CM | POA: Diagnosis not present

## 2019-02-20 DIAGNOSIS — M47812 Spondylosis without myelopathy or radiculopathy, cervical region: Secondary | ICD-10-CM

## 2019-02-20 DIAGNOSIS — M4722 Other spondylosis with radiculopathy, cervical region: Secondary | ICD-10-CM

## 2019-02-20 DIAGNOSIS — M5412 Radiculopathy, cervical region: Secondary | ICD-10-CM | POA: Diagnosis not present

## 2019-02-20 DIAGNOSIS — M542 Cervicalgia: Secondary | ICD-10-CM

## 2019-02-20 DIAGNOSIS — G894 Chronic pain syndrome: Secondary | ICD-10-CM | POA: Diagnosis not present

## 2019-02-20 DIAGNOSIS — G8929 Other chronic pain: Secondary | ICD-10-CM

## 2019-02-20 MED ORDER — TRAMADOL HCL 50 MG PO TABS: 50.0000 mg | ORAL_TABLET | Freq: Four times a day (QID) | ORAL | 2 refills | Status: DC | PRN

## 2019-02-20 NOTE — Progress Notes (Signed)
Pain Management Virtual Encounter Note - Virtual Visit via Telephone Telehealth (real-time audio visits between healthcare provider and patient).   Patient's Phone No. & Preferred Pharmacy:  [phone] (home); [phone] (mobile); (Preferred) [phone] [email or web address] .[facility] [phone] [address], [address] [place] [address] Phone: [phone] Fax: [phone]    Pre-screening note:  Our staff contacted Ms. Steib and offered her an "in person", "face-to-face" appointment versus a telephone encounter. She indicated preferring the telephone encounter, at this time.   Reason for Virtual Visit: COVID-19*  Social distancing based on CDC and AMA recommendations.   I contacted [name] on 02/20/2019 via telephone.      I clearly identified myself as Gillis Santa, MD. I verified that I was speaking with the correct person using two identifiers (Name: JODELLE FAUSTO, and date of birth: 1956-06-17).  Advanced Informed Consent I sought verbal advanced consent from [name] for virtual visit interactions. I informed Ms. Latimore of possible security and privacy concerns, risks, and limitations associated with providing "not-in-person" medical evaluation and management services. I also informed Ms. Malone of the availability of "in-person" appointments. Finally, I informed her that there would be a charge for the virtual visit and that she could be  personally, fully or partially, financially responsible for it. Ms. Browder expressed understanding and agreed to proceed.   Historic Elements   Ms. ED RAYSON is a 63 y.o. year old, female patient evaluated today after her last encounter by our practice on 11/28/2018. Ms. Lyvers  has a past medical history of Anemia, Anxiety, Arthritis, Colon cancer ([place]) (7062), Complication of anesthesia, Constipation, COPD (chronic obstructive pulmonary disease) ([place]),  Depression, Diabetes mellitus without complication ([place]), Dysrhythmia, GERD (gastroesophageal reflux disease), H/O allergic rhinitis, Heart murmur, Hemorrhoid, Hyperlipidemia, Hypertension, Neck pain, chronic, Neuritis, Ovarian failure, Personal history of chemotherapy, Personal history of radiation therapy, PONV (postoperative nausea and vomiting), Pre-diabetes (2019), Proteinuria, Psoriasis, Sleep apnea (09/12/2016), Tachycardia, paroxysmal ([place]), and Urinary incontinence. She also  has a past surgical history that includes Tubal ligation; tonsillectomy (3762); herniated disc repair (2001); Anterior fusion lumbar spine (1990); Neck surgery; Mass excision (Left, 02/23/2016); Colonoscopy (2018); Portacath placement (Left, 03/12/2016); Cardiac catheterization (2009? ); Cardiac catheterization (05/2012); Port-a-cath removal (2018); Liver biopsy (N/A, 09/12/2017); Cholecystectomy (N/A, 09/12/2017); and Trigger finger release (Right, 09/2018). Ms. Burlison has a current medication list which includes the following prescription(s): albuterol, betamethasone dipropionate, blood glucose meter kit and supplies, bupropion, contour next test, divalproex, fluticasone, gabapentin, gemfibrozil, glucose blood, hydrocortisone, icosapent ethyl, ketoconazole, loratadine, losartan, meloxicam, metformin, metoprolol succinate, microlet lancets, mirabegron er, omeprazole, oxybutynin, oxygen-helium, tiotropium, tizanidine, tolterodine, tramadol, triamcinolone cream, venlafaxine xr, and vitamin d (ergocalciferol). She  reports that she quit smoking about 13 years ago. Her smoking use included cigarettes. She started smoking about 55 years ago. She has a 32.00 pack-year smoking history. She has never used smokeless tobacco. She reports that she does not drink alcohol or use drugs. Ms. Guallpa is allergic to dapsone; hydrochlorothiazide; sulfa antibiotics; and sulfasalazine.   HPI  Today, she is being contacted for medication management.  No change  in medical history since last visit.  Patient's pain is at baseline.  Patient continues multimodal pain regimen as prescribed.  States that it provides pain relief and improvement in functional status.   Pharmacotherapy Assessment   01/26/2019  1   11/28/2018  Tramadol Hcl 50 MG Tablet  120.00  [place]   [id number]   [facility] ([id number])   2  20.00 MME  Comm Ins   [place]    Monitoring: Pharmacotherapy: No side-effects or adverse reactions reported. [place] PMP: PDMP reviewed during this encounter.       Compliance: No problems identified. Effectiveness: Clinically acceptable. Plan: Refer to "POC".  Pertinent Labs   SAFETY SCREENING Profile No results found for: SARSCOV2NAA, COVIDSOURCE, STAPHAUREUS, MRSAPCR, HCVAB, HIV, PREGTESTUR Renal Function Lab Results  Component Value Date   BUN 10 12/06/2017   CREATININE 0.90 08/01/8249   BCR NOT APPLICABLE 03/70/4888   GFRAA 79 12/06/2017   GFRNONAA 69 12/06/2017   Hepatic Function Lab Results  Component Value Date   AST 39 (H) 12/06/2017   ALT 35 (H) 12/06/2017   ALBUMIN 4.3 03/03/2017   UDS Summary  Date Value Ref Range Status  08/31/2018 FINAL  Final    Comment:    ==================================================================== TOXASSURE SELECT 13 (MW) ==================================================================== Test                             Result       Flag       Units Drug Present and Declared for Prescription Verification   Hydromorphone                  353          EXPECTED   ng/mg creat   Norhydrocodone                 596          EXPECTED   ng/mg creat    Hydromorphone and norhydrocodone are expected metabolites of    hydrocodone. Sources of hydrocodone are scheduled prescription    medications.  Hydromorphone is also available as a scheduled    prescription medication. Drug Absent but Declared for Prescription Verification   Hydrocodone                    Not Detected UNEXPECTED ng/mg creat    Hydrocodone is almost  always present in patients taking this drug    consistently. Absence of hydrocodone could be due to lapse of    time since the last dose or unusual pharmacokinetics (rapid    metabolism). ==================================================================== Test                      Result    Flag   Units      Ref Range   Creatinine              74               mg/dL      >=20 ==================================================================== Declared Medications:  The flagging and interpretation on this report are based on the  following declared medications.  Unexpected results may arise from  inaccuracies in the declared medications.  **Note: The testing scope of this panel includes these medications:  Hydrocodone (Norco)  **Note: The testing scope of this panel does not include following  reported medications:  Acetaminophen (Norco)  Albuterol  Betamethasone (Diprolene)  Divalproex (Depakote)  Estrogen (Premarin)  Fluticasone (Flonase)  Gabapentin (Neurontin)  Gemfibrozil (Lopid)  Hydrocortisone  Hydroxyzine  Icosapent Ethyl  Ketoconazole (Nizoral)  Losartan (Cozaar)  Meloxicam (Mobic)  Metformin (Glucophage)  Metoprolol (Toprol)  Mirabegron (Myrbetriq)  Omeprazole (Prilosec)  Oxybutynin (Ditropan)  Tiotropium (Spiriva)  Tizanidine (Zanaflex)  Tolterodine (Detrol)  Triamcinolone (  Kenalog)  Venlafaxine (Effexor)  Vitamin D2 (Drisdol) ==================================================================== For clinical consultation, please call [phone]. ====================================================================    Note: Above Lab results reviewed.  Recent imaging  DG C-Arm 1-60 Min-No Report Fluoroscopy was utilized by the requesting physician.  No radiographic  interpretation.   Assessment  The primary encounter diagnosis was Chronic cervical pain. Diagnoses of Cervical radiculopathy (left), Cervicalgia, Myofascial pain syndrome, cervical,  Chronic pain syndrome, Cervical facet joint syndrome, Spondylosis of cervical region without myelopathy or radiculopathy, and Osteoarthritis of spine with radiculopathy, cervical region were also pertinent to this visit.  Plan of Care  I am having Wynn Kernes. Menge maintain her OXYGEN, hydrocortisone, albuterol, tiotropium, Vitamin D (Ergocalciferol), tolterodine, oxybutynin, meloxicam, Icosapent Ethyl, fluticasone, metoprolol succinate, losartan, metFORMIN, omeprazole, loratadine, gabapentin, tiZANidine, blood glucose meter kit and supplies, Microlet Lancets, Contour Next Test, mirabegron ER, venlafaxine XR, divalproex, buPROPion, gemfibrozil, glucose blood, betamethasone dipropionate, ketoconazole, triamcinolone cream, and traMADol.  General Recommendations: The pain condition that the patient suffers from is best treated with a multidisciplinary approach that involves an increase in physical activity to prevent de-conditioning and worsening of the pain cycle, as well as psychological counseling (formal and/or informal) to address the co-morbid psychological affects of pain. Treatment will often involve judicious use of pain medications and interventional procedures to decrease the pain, allowing the patient to participate in the physical activity that will ultimately produce long-lasting pain reductions. The goal of the multidisciplinary approach is to return the patient to a higher level of overall function and to restore their ability to perform activities of daily living.  Pharmacotherapy (Medications Ordered): Meds ordered this encounter  Medications  . traMADol (ULTRAM) 50 MG tablet    Sig: Take 1 tablet (50 mg total) by mouth every 6 (six) hours as needed for severe pain. Month last 30 days.    Dispense:  120 tablet    Refill:  2    [facility] STOP ACT - Not applicable. Fill one day early if pharmacy is closed on scheduled refill date.   Continue multimodal analgesics including gabapentin, tizanidine,  Effexor.  As needed. Follow-up plan:   Return in about 3 months (around 05/23/2019) for Medication Management.        Recent Visits Date Type Provider Dept  11/28/18 Office Visit Gillis Santa, MD [facility]  Showing recent visits within past 90 days and meeting all other requirements   Today's Visits Date Type Provider Dept  02/20/19 Office Visit Gillis Santa, MD [facility]  Showing today's visits and meeting all other requirements   Future Appointments No visits were found meeting these conditions.  Showing future appointments within next 90 days and meeting all other requirements   I discussed the assessment and treatment plan with the patient. The patient was provided an opportunity to ask questions and all were answered. The patient agreed with the plan and demonstrated an understanding of the instructions.  Patient advised to call back or seek an in-person evaluation if the symptoms or condition worsens.  Total duration of non-face-to-face encounter: 25 minutes.  Note by: Gillis Santa, MD Date: 02/20/2019; Time: 10:16 AM  Note: This dictation was prepared with Dragon dictation. Any transcriptional errors that may result from this process are unintentional.  Disclaimer:  * Given the special circumstances of the COVID-19 pandemic, the federal government has announced that the Office for Civil Rights (OCR) will exercise its enforcement discretion and will not impose penalties on physicians using telehealth in the event of noncompliance with regulatory requirements  under the [employer] and Accountability Act (HIPAA) in connection with the good faith provision of telehealth during the PTWSF-68 national public health emergency. (AMA)

## 2019-03-17 ENCOUNTER — Other Ambulatory Visit: Payer: Self-pay | Admitting: Student in an Organized Health Care Education/Training Program

## 2019-03-17 DIAGNOSIS — M542 Cervicalgia: Secondary | ICD-10-CM

## 2019-03-17 DIAGNOSIS — G8929 Other chronic pain: Secondary | ICD-10-CM

## 2019-03-19 ENCOUNTER — Other Ambulatory Visit: Payer: Self-pay | Admitting: Student in an Organized Health Care Education/Training Program

## 2019-03-19 ENCOUNTER — Other Ambulatory Visit: Payer: Self-pay | Admitting: Family Medicine

## 2019-03-19 DIAGNOSIS — G8929 Other chronic pain: Secondary | ICD-10-CM

## 2019-03-19 DIAGNOSIS — I10 Essential (primary) hypertension: Secondary | ICD-10-CM

## 2019-03-19 DIAGNOSIS — M542 Cervicalgia: Secondary | ICD-10-CM

## 2019-03-20 NOTE — Telephone Encounter (Signed)
Patient called stating she is due to pick up meds and pharmacy says they do not have scripts. Also wanted to see if she could get 90 scripts instead of 30 day. She had Virtual Visit on 02-20-19 for med mgmt.

## 2019-03-22 ENCOUNTER — Telehealth: Payer: Self-pay | Admitting: *Deleted

## 2019-03-28 ENCOUNTER — Telehealth: Payer: Self-pay | Admitting: *Deleted

## 2019-03-28 DIAGNOSIS — G8929 Other chronic pain: Secondary | ICD-10-CM

## 2019-03-28 DIAGNOSIS — M542 Cervicalgia: Secondary | ICD-10-CM

## 2019-03-28 MED ORDER — TIZANIDINE HCL 4 MG PO TABS: 4.0000 mg | ORAL_TABLET | Freq: Three times a day (TID) | ORAL | 4 refills | Status: DC | PRN

## 2019-03-28 NOTE — Telephone Encounter (Signed)
Called to let her know the Zanaflex was called in to her pharmacy. No answer. LVM.

## 2019-03-28 NOTE — Telephone Encounter (Signed)
Zanaflex refill is due and appropriate. Next appt in October.

## 2019-04-09 ENCOUNTER — Other Ambulatory Visit: Payer: Self-pay | Admitting: Family Medicine

## 2019-04-09 DIAGNOSIS — Z1231 Encounter for screening mammogram for malignant neoplasm of breast: Secondary | ICD-10-CM

## 2019-04-13 ENCOUNTER — Other Ambulatory Visit: Payer: Self-pay | Admitting: Family Medicine

## 2019-04-13 DIAGNOSIS — I10 Essential (primary) hypertension: Secondary | ICD-10-CM

## 2019-04-13 DIAGNOSIS — K219 Gastro-esophageal reflux disease without esophagitis: Secondary | ICD-10-CM

## 2019-04-13 DIAGNOSIS — I479 Paroxysmal tachycardia, unspecified: Secondary | ICD-10-CM

## 2019-04-20 ENCOUNTER — Other Ambulatory Visit: Payer: Self-pay

## 2019-04-20 ENCOUNTER — Ambulatory Visit (INDEPENDENT_AMBULATORY_CARE_PROVIDER_SITE_OTHER): Payer: BLUE CROSS/BLUE SHIELD | Admitting: Family Medicine

## 2019-04-20 ENCOUNTER — Encounter: Payer: Self-pay | Admitting: Family Medicine

## 2019-04-20 VITALS — BP 144/89 | HR 83 | Wt 181.0 lb

## 2019-04-20 DIAGNOSIS — I10 Essential (primary) hypertension: Secondary | ICD-10-CM

## 2019-04-20 DIAGNOSIS — J9611 Chronic respiratory failure with hypoxia: Secondary | ICD-10-CM

## 2019-04-20 DIAGNOSIS — G8929 Other chronic pain: Secondary | ICD-10-CM

## 2019-04-20 DIAGNOSIS — D692 Other nonthrombocytopenic purpura: Secondary | ICD-10-CM

## 2019-04-20 DIAGNOSIS — E781 Pure hyperglyceridemia: Secondary | ICD-10-CM

## 2019-04-20 DIAGNOSIS — E785 Hyperlipidemia, unspecified: Secondary | ICD-10-CM

## 2019-04-20 DIAGNOSIS — F339 Major depressive disorder, recurrent, unspecified: Secondary | ICD-10-CM | POA: Diagnosis not present

## 2019-04-20 DIAGNOSIS — M542 Cervicalgia: Secondary | ICD-10-CM

## 2019-04-20 DIAGNOSIS — E213 Hyperparathyroidism, unspecified: Secondary | ICD-10-CM

## 2019-04-20 DIAGNOSIS — E1169 Type 2 diabetes mellitus with other specified complication: Secondary | ICD-10-CM | POA: Diagnosis not present

## 2019-04-20 DIAGNOSIS — E559 Vitamin D deficiency, unspecified: Secondary | ICD-10-CM

## 2019-04-20 DIAGNOSIS — J432 Centrilobular emphysema: Secondary | ICD-10-CM

## 2019-04-20 DIAGNOSIS — Z9981 Dependence on supplemental oxygen: Secondary | ICD-10-CM

## 2019-04-20 NOTE — Progress Notes (Signed)
Name: Judy Allen   MRN: [id number]    DOB: [birth year]   Date:04/20/2019       Progress Note  Subjective  Chief Complaint  Chief Complaint  Patient presents with   Follow-up    I connected with  [name] on 04/20/19 at  2:20 PM EDT by telephone and verified that I am speaking with the correct person using two identifiers.  I discussed the limitations, risks, security and privacy concerns of performing an evaluation and management service by telephone and the availability of in person appointments. Staff also discussed with the patient that there may be a patient responsible charge related to this service. Patient Location: at daughter's house - where she lives  Provider Location: [facility]   HPI  Hypertriglyceridemia: seeing Dr. Peter Congo is off Lovaza because of cost, but is takingGemfibrozil, and is now taking Vascepa , explained that she may be able to stop Gemfibrozil. We will recheck labs on Monday   HTN: seen by pulmonologist and advised to stop HCTZ because calcium was high, she has hyperparathyroidism,bp at home has improved, occasionally it spikes 160's, but it can go down to 120's at times  Hyperparathyroidism:calcium had gone  back to normal since off hctz, however labs done at [facility] this past week showed elevation again, we will recheck labs today denies cramps, no kidney stone.  Emphysema and hypoxemia:on oxygen 3 liters - 24 hours per day fort he past month and can got up to 4 liters when moving, sob is stable, no cough or wheezing at this time, still taking spiriva and albuterol prn. Under the care of pulmonologist at [facility], seeing a new pulmonologist since Dr. Patsy Baltimore graduated from her residency   Major Depression:she has been depressed for many years, got worse after her husband died in 01/26/18, she bounced back, however conflict with youngest daughter - [name] going on for about one year, did not feel welcomed, felt like she had to  clean all the time and cooking, she was having crying spells. She moved in to her oldest daughter July 25 th, 2020 to watch grandson while her daughter went on her honey moon and has stayed there since. Feeling much better, phq 9 is down to 2 but only fatigue and problems sleeping. Otherwise negative screen  History of anal cancer: still going to [facility] for follow up, every 6 months for radiation oncologist and every 6 months with oncologist in alternating fashion.She was recently seen by radiation oncologist and will go back in one year  DMII: diagnosed 02/2018 by Dr. Gabriel Carina, hgbA1C was at 6.6% she is nowon metformin, tolerating well, and last hgbA1C down to 6.3% back in March , she skipped visit with Dr. Gabriel Carina because of COVID-19, she will come in Monday to have labs done including urine micro. She has a history of dyslipidemia,   Chronic pain: under the care of Dr. Holley Raring, she states she is doing well on Tramadol, also taking muscle relaxer    Patient Active Problem List   Diagnosis Date Noted   Diabetes mellitus type 2 in obese ([place]) 11/06/2018   Cervical radiculopathy (left) 08/31/2018   Osteoarthritis of spine with radiculopathy, cervical region 08/31/2018   History of anal cancer 05/30/2018   Myofascial pain syndrome, cervical 05/16/2018   Chronic pain syndrome 05/16/2018   Cervical facet joint syndrome 05/16/2018   Perihepatitis ([place]) 09/27/2017   Abnormal liver function test 09/27/2017   Fatty liver 12/22/2016   Chronic respiratory failure with  hypoxia, on home oxygen therapy ([place]) 07/28/2016   Centrilobular emphysema ([place]) 07/14/2016   Abnormal Pap smear of cervix 04/14/2016   Anal squamous cell carcinoma ([place]) 03/09/2016   Metastatic squamous cell carcinoma ([place]) 02/25/2016   Non-thrombocytopenic purpura ([place]) 01/12/2016   Chronic constipation 10/16/2015   History of fusion of cervical spine 04/02/2015   Benign hypertension 01/16/2015   Cervicalgia  01/16/2015   CN (constipation) 01/16/2015   Gastric reflux 01/16/2015   Hypertriglyceridemia 01/16/2015   Edema leg 01/16/2015   Chronic recurrent major depressive disorder ([place]) 16/05/9603   Dysmetabolic syndrome 54/04/8118   Obstructive apnea 01/16/2015   Psoriasis 01/16/2015   Allergic rhinitis 01/16/2015   Bursitis, trochanteric 01/16/2015   GAD (generalized anxiety disorder) 01/16/2015   Tachycardia, paroxysmal ([place])     Past Surgical History:  Procedure Laterality Date   ANTERIOR FUSION LUMBAR SPINE  1990   plate in back, not metal   CARDIAC CATHETERIZATION  2009?    [facility]   CARDIAC CATHETERIZATION  05/2012   [facility]: Mild hypertension 37/12 with a mean pressure of 21 mm mercury   CHOLECYSTECTOMY N/A 09/12/2017   Procedure: LAPAROSCOPIC CHOLECYSTECTOMY WITH INTRAOPERATIVE CHOLANGIOGRAM;  Surgeon: Robert Bellow, MD;  Location: [facility] ORS;  Service: General;  Laterality: N/A;   COLONOSCOPY  2018   herniated disc repair  2001   LIVER BIOPSY N/A 09/12/2017   Procedure: LIVER BIOPSY;  Surgeon: Robert Bellow, MD;  Location: [facility];  Service: General;  Laterality: N/A;   MASS EXCISION Left 02/23/2016   Procedure: EXCISION MASS;  Surgeon: Christene Lye, MD;  Location: [facility] OR[facility];  Service: General;  Laterality: Left;   NECK SURGERY     PORT-A-CATH REMOVAL  2018   PORTACATH PLACEMENT Left 03/12/2016   Procedure: INSERTION PORT-A-CATH;  Surgeon: Christene Lye, MD;  Location: [facility] OR[facility];  Service: General;  Laterality: Left;   tonsillectomy  1959   history   TRIGGER FINGER RELEASE Right 09/2018   Dr. Sabra Heck    TUBAL LIGATION     s/p    Family History  Problem Relation Age of Onset   Heart attack Mother    Asthma Mother    Liver cancer Father    Breast cancer Maternal Aunt    Bladder Cancer Neg Hx    Kidney cancer Neg Hx     Social History   Socioeconomic History   Marital status: Widowed    Spouse name: Automotive engineer     Number of children: 3   Years of education: Not on file   Highest education level: 11th grade  Occupational History   Not on file  Social Needs   Financial resource strain: Somewhat hard   Food insecurity    Worry: Sometimes true    Inability: Sometimes true   Transportation needs    Medical: No    Non-medical: No  Tobacco Use   Smoking status: Former Smoker    Packs/day: 1.00    Years: 32.00    Pack years: 32.00    Types: Cigarettes    Start date: 08/10/1963    Quit date: 01/15/2006    Years since quitting: 13.2   Smokeless tobacco: Never Used  Substance and Sexual Activity   Alcohol use: No    Alcohol/week: 0.0 standard drinks   Drug use: No   Sexual activity: Never    Birth control/protection: Post-menopausal  Lifestyle   Physical activity    Days per week: 0 days    Minutes per session: 0  min   Stress: Not at all  Relationships   Social connections    Talks on phone: More than three times a week    Gets together: More than three times a week    Attends religious service: Never    Active member of club or organization: No    Attends meetings of clubs or organizations: Never    Relationship status: Widowed   Intimate partner violence    Fear of current or ex partner: No    Emotionally abused: No    Physically abused: No    Forced sexual activity: No  Other Topics Concern   Not on file  Social History Narrative   Husband passed away on 21-Dec-2017   She was living with [name] for few years, but moved in with Candace her oldest daughter this past Summer.      Current Outpatient Medications:    albuterol (PROVENTIL HFA;VENTOLIN HFA) 108 (90 Base) MCG/ACT inhaler, Inhale 2 puffs into the lungs every 6 (six) hours as needed for wheezing or shortness of breath., Disp: , Rfl:    betamethasone dipropionate (DIPROLENE) 0.05 % cream, Apply 1 application topically 2 (two) times daily as needed. Avoid face , groin and axilla, Disp: 45 g, Rfl: 0    blood glucose meter kit and supplies, Dispense based on patient and insurance preference. Use up to four times daily as directed. (FOR ICD-10 E10.9, E11.9)., Disp: 1 each, Rfl: 0   buPROPion (WELLBUTRIN XL) 150 MG 24 hr tablet, TAKE 1 TABLET(150 MG) BY MOUTH DAILY, Disp: 90 tablet, Rfl: 1   CONTOUR NEXT TEST test strip, USE UP TO FOUR TIMES DAILY AS DIRECTED, Disp: 100 each, Rfl: 5   divalproex (DEPAKOTE) 250 MG DR tablet, Take 1 tablet (250 mg total) by mouth at bedtime., Disp: 90 tablet, Rfl: 1   fluticasone (FLONASE) 50 MCG/ACT nasal spray, Place 2 sprays into both nostrils daily., Disp: 16 g, Rfl: 2   gabapentin (NEURONTIN) 300 MG capsule, TAKE 1 CAPSULE(300 MG) BY MOUTH THREE TIMES DAILY, Disp: 90 capsule, Rfl: 4   gemfibrozil (LOPID) 600 MG tablet, Take 1 tablet by mouth 2 (two) times a day., Disp: , Rfl:    glucose blood test strip, Contour Next Test Strips, Disp: , Rfl:    hydrocortisone 2.5 % cream, Apply 1 application topically 2 (two) times daily., Disp: , Rfl:    ketoconazole (NIZORAL) 2 % cream, Apply 1 application topically 2 (two) times daily., Disp: 60 g, Rfl: 0   loratadine (CLARITIN) 10 MG tablet, Take 1 tablet (10 mg total) by mouth daily., Disp: 90 tablet, Rfl: 1   losartan (COZAAR) 100 MG tablet, TAKE 1 TABLET(100 MG) BY MOUTH DAILY, Disp: 90 tablet, Rfl: 0   meloxicam (MOBIC) 15 MG tablet, TK 1 T PO QD, Disp: , Rfl:    metFORMIN (GLUCOPHAGE-XR) 500 MG 24 hr tablet, Take 2 tablets (1,000 mg total) by mouth daily., Disp: 180 tablet, Rfl: 1   metoprolol succinate (TOPROL-XL) 50 MG 24 hr tablet, TAKE 1 TABLET(50 MG) BY MOUTH DAILY, Disp: 90 tablet, Rfl: 1   Microlet Lancets MISC, USE FOUR TIMES DAILY, Disp: 100 each, Rfl: 5   mirabegron ER (MYRBETRIQ) 50 MG TB24 tablet, Take 1 tablet (50 mg total) by mouth daily., Disp: 30 tablet, Rfl: 11   omeprazole (PRILOSEC) 40 MG capsule, TAKE 1 CAPSULE(40 MG) BY MOUTH DAILY, Disp: 90 capsule, Rfl: 1   oxybutynin  (DITROPAN-XL) 10 MG 24 hr tablet, oxybutynin chloride ER 10  mg tablet,extended release 24 hr  TAKE 1 TABLET BY MOUTH DAILY, Disp: , Rfl:    OXYGEN, Inhale 2 L into the lungs continuous. , Disp: , Rfl:    tiotropium (SPIRIVA) 18 MCG inhalation capsule, Place 18 mcg into inhaler and inhale daily. [facility], Disp: , Rfl:    tiZANidine (ZANAFLEX) 4 MG tablet, Take 1 tablet (4 mg total) by mouth every 8 (eight) hours as needed., Disp: 90 tablet, Rfl: 4   traMADol (ULTRAM) 50 MG tablet, Take 1 tablet (50 mg total) by mouth every 6 (six) hours as needed for severe pain. Month last 30 days., Disp: 120 tablet, Rfl: 2   triamcinolone cream (KENALOG) 0.1 %, Apply topically 2 (two) times daily., Disp: 30 g, Rfl: 0   VASCEPA 1 g CAPS, TAKE 2 CAPSULES(2 GRAMS) BY MOUTH TWICE DAILY, Disp: 360 capsule, Rfl: 1   venlafaxine XR (EFFEXOR-XR) 75 MG 24 hr capsule, TAKE 3 CAPSULES BY MOUTH DAILY WITH BREAKFAST, Disp: 270 capsule, Rfl: 1   Vitamin D, Ergocalciferol, (DRISDOL) 50000 units CAPS capsule, Take by mouth., Disp: , Rfl:   Allergies  Allergen Reactions   Dapsone Other (See Comments)    Hypoxemia   Hydrochlorothiazide     History of hyperparathyroidism    Sulfa Antibiotics Rash   Sulfasalazine Rash    I personally reviewed active problem list, medication list, allergies, family history, social history, health maintenance with the patient/caregiver today.   ROS  Ten systems reviewed and is negative except as mentioned in HPI   Objective  Virtual encounter, vitals at home  Today's Vitals   04/20/19 1309  BP: (!) 144/89  Pulse: 83  Weight: 181 lb (82.1 kg)  PainSc: 0-No pain   Body mass index is 31.07 kg/m.Marland Kitchen  Body mass index is 31.07 kg/m.  Physical Exam  Awake, alert and oriented   PHQ2/9: Depression screen [facility] 2/9 04/20/2019 02/06/2019 10/23/2018 08/31/2018 05/30/2018  Decreased Interest 0 3 3 0 1  Down, Depressed, Hopeless 0 1 2 1 1   PHQ - 2 Score 0 4 5 1 2   Altered sleeping 1  0 1 - 0  Tired, decreased energy 1 2 2  - 1  Change in appetite 0 0 0 - 0  Feeling bad or failure about yourself  0 1 3 - 0  Trouble concentrating 0 0 0 - 0  Moving slowly or fidgety/restless 0 0 0 - 0  Suicidal thoughts 0 0 1 - 0  PHQ-9 Score 2 7 12  - 3  Difficult doing work/chores Not difficult at all Not difficult at all Somewhat difficult - Somewhat difficult  Some recent data might be hidden   PHQ-2/9 Result is negative.    Fall Risk: Fall Risk  04/20/2019 02/06/2019 09/04/2018 08/31/2018 05/30/2018  Falls in the past year? 0 0 0 0 No  Number falls in past yr: 0 0 - 0 -  Injury with Fall? 0 0 - 0 -  Follow up Falls evaluation completed - - - -     Assessment & Plan  1. Chronic recurrent major depressive disorder ([place])   2. Dyslipidemia associated with type 2 diabetes mellitus (HCC)  - Microalbumin / creatinine urine ratio - Hemoglobin A1c  3. Hyperparathyroidism ([place])  - Parathyroid hormone, intact (no Ca)  4. Benign hypertension  - COMPLETE METABOLIC PANEL WITH GFR - CBC with Differential/Platelet  5. Hypertriglyceridemia  - Lipid panel  6. Centrilobular emphysema ([place])   7. Vitamin D deficiency  - VITAMIN D 25 Hydroxy (Vit-D  Deficiency, Fractures)  8. Chronic neck pain  Sees Dr. Holley Raring   9. Senile purpura ([place])  Per patient is stable  10. Chronic respiratory failure with hypoxia, on home oxygen therapy (HCC)  On oxygen, keep follow up with pulmonologist   I discussed the assessment and treatment plan with the patient. The patient was provided an opportunity to ask questions and all were answered. The patient agreed with the plan and demonstrated an understanding of the instructions.   The patient was advised to call back or seek an in-person evaluation if the symptoms worsen or if the condition fails to improve as anticipated.  I provided 25  minutes of non-face-to-face time during this encounter.  Loistine Chance, MD

## 2019-04-23 ENCOUNTER — Ambulatory Visit (INDEPENDENT_AMBULATORY_CARE_PROVIDER_SITE_OTHER): Payer: BLUE CROSS/BLUE SHIELD

## 2019-04-23 ENCOUNTER — Other Ambulatory Visit: Payer: Self-pay

## 2019-04-23 DIAGNOSIS — Z23 Encounter for immunization: Secondary | ICD-10-CM

## 2019-04-24 LAB — LIPID PANEL
Cholesterol: 148 mg/dL (ref <200)
HDL: 36 mg/dL — ABNORMAL LOW (ref >=50)
LDL Cholesterol (Calc): 78 mg/dL (calc)
Non-HDL Cholesterol (Calc): 112 mg/dL (calc) (ref <130)
Total CHOL/HDL Ratio: 4.1 (calc) (ref <5.0)
Triglycerides: 259 mg/dL — ABNORMAL HIGH (ref <150)

## 2019-04-24 LAB — CBC WITH DIFFERENTIAL/PLATELET
Absolute Monocytes: 526 cells/uL (ref 200–950)
Basophils Absolute: 62 cells/uL (ref 0–200)
Basophils Relative: 1.1 %
Eosinophils Absolute: 241 cells/uL (ref 15–500)
Eosinophils Relative: 4.3 %
HCT: 35.1 % (ref 35.0–45.0)
Hemoglobin: 11.9 g/dL (ref 11.7–15.5)
Lymphs Abs: 1450 cells/uL (ref 850–3900)
MCH: 31.2 pg (ref 27.0–33.0)
MCHC: 33.9 g/dL (ref 32.0–36.0)
MCV: 92.1 fL (ref 80.0–100.0)
MPV: 9.8 fL (ref 7.5–12.5)
Monocytes Relative: 9.4 %
Neutro Abs: 3321 cells/uL (ref 1500–7800)
Neutrophils Relative %: 59.3 %
Platelets: 300 10*3/uL (ref 140–400)
RBC: 3.81 10*6/uL (ref 3.80–5.10)
RDW: 13.2 % (ref 11.0–15.0)
Total Lymphocyte: 25.9 %
WBC: 5.6 10*3/uL (ref 3.8–10.8)

## 2019-04-24 LAB — COMPLETE METABOLIC PANEL WITH GFR
AG Ratio: 2.1 (calc) (ref 1.0–2.5)
ALT: 15 U/L (ref 6–29)
AST: 15 U/L (ref 10–35)
Albumin: 4.7 g/dL (ref 3.6–5.1)
Alkaline phosphatase (APISO): 64 U/L (ref 37–153)
BUN: 18 mg/dL (ref 7–25)
CO2: 30 mmol/L (ref 20–32)
Calcium: 10.6 mg/dL — ABNORMAL HIGH (ref 8.6–10.4)
Chloride: 104 mmol/L (ref 98–110)
Creat: 0.93 mg/dL (ref 0.50–0.99)
GFR, Est African American: 75 mL/min/{1.73_m2} (ref >=60)
GFR, Est Non African American: 65 mL/min/{1.73_m2} (ref >=60)
Globulin: 2.2 g/dL (calc) (ref 1.9–3.7)
Glucose, Bld: 106 mg/dL — ABNORMAL HIGH (ref 65–99)
Potassium: 4.7 mmol/L (ref 3.5–5.3)
Sodium: 145 mmol/L (ref 135–146)
Total Bilirubin: 0.2 mg/dL (ref 0.2–1.2)
Total Protein: 6.9 g/dL (ref 6.1–8.1)

## 2019-04-24 LAB — HEMOGLOBIN A1C
Hgb A1c MFr Bld: 5.8 % of total Hgb — ABNORMAL HIGH (ref <5.7)
Mean Plasma Glucose: 120 (calc)
eAG (mmol/L): 6.6 (calc)

## 2019-04-24 LAB — MICROALBUMIN / CREATININE URINE RATIO
Creatinine, Urine: 128 mg/dL (ref 20–275)
Microalb Creat Ratio: 13 mcg/mg creat (ref <30)
Microalb, Ur: 1.6 mg/dL

## 2019-04-24 LAB — VITAMIN D 25 HYDROXY (VIT D DEFICIENCY, FRACTURES): Vit D, 25-Hydroxy: 14 ng/mL — ABNORMAL LOW (ref 30–100)

## 2019-04-24 LAB — PARATHYROID HORMONE, INTACT (NO CA): PTH: 57 pg/mL (ref 14–64)

## 2019-04-26 ENCOUNTER — Encounter: Payer: Self-pay | Admitting: Family Medicine

## 2019-04-26 ENCOUNTER — Other Ambulatory Visit: Payer: Self-pay | Admitting: Family Medicine

## 2019-04-26 DIAGNOSIS — E559 Vitamin D deficiency, unspecified: Secondary | ICD-10-CM

## 2019-04-26 MED ORDER — VITAMIN D (ERGOCALCIFEROL) 1.25 MG (50000 UNIT) PO CAPS: 50000.0000 [IU] | ORAL_CAPSULE | ORAL | 1 refills | Status: DC

## 2019-04-30 ENCOUNTER — Inpatient Hospital Stay: Admission: RE | Admit: 2019-04-30 | Payer: BLUE CROSS/BLUE SHIELD | Source: Ambulatory Visit

## 2019-05-17 ENCOUNTER — Encounter: Payer: BLUE CROSS/BLUE SHIELD | Admitting: Obstetrics and Gynecology

## 2019-05-21 ENCOUNTER — Encounter: Payer: Self-pay | Admitting: Student in an Organized Health Care Education/Training Program

## 2019-05-21 ENCOUNTER — Telehealth: Payer: Self-pay | Admitting: *Deleted

## 2019-05-21 NOTE — Telephone Encounter (Signed)
Attempted to call for pre appointment review of meds/allergies. Message left. 

## 2019-05-22 ENCOUNTER — Other Ambulatory Visit: Payer: Self-pay

## 2019-05-22 ENCOUNTER — Ambulatory Visit
Payer: Medicare Other | Attending: Student in an Organized Health Care Education/Training Program | Admitting: Student in an Organized Health Care Education/Training Program

## 2019-05-22 ENCOUNTER — Encounter: Payer: Self-pay | Admitting: Student in an Organized Health Care Education/Training Program

## 2019-05-22 DIAGNOSIS — M542 Cervicalgia: Secondary | ICD-10-CM | POA: Diagnosis not present

## 2019-05-22 DIAGNOSIS — G8929 Other chronic pain: Secondary | ICD-10-CM

## 2019-05-22 DIAGNOSIS — M7918 Myalgia, other site: Secondary | ICD-10-CM | POA: Diagnosis not present

## 2019-05-22 DIAGNOSIS — G894 Chronic pain syndrome: Secondary | ICD-10-CM | POA: Diagnosis not present

## 2019-05-22 DIAGNOSIS — M47812 Spondylosis without myelopathy or radiculopathy, cervical region: Secondary | ICD-10-CM

## 2019-05-22 DIAGNOSIS — M5412 Radiculopathy, cervical region: Secondary | ICD-10-CM

## 2019-05-22 MED ORDER — TIZANIDINE HCL 4 MG PO TABS: 4.0000 mg | ORAL_TABLET | Freq: Three times a day (TID) | ORAL | 5 refills | Status: DC | PRN

## 2019-05-22 MED ORDER — GABAPENTIN 300 MG PO CAPS: ORAL_CAPSULE | ORAL | 5 refills | Status: DC

## 2019-05-22 MED ORDER — TIOTROPIUM BROMIDE MONOHYDRATE 18 MCG IN CAPS: 18.0000 ug | ORAL_CAPSULE | Freq: Every day | RESPIRATORY_TRACT | Status: DC

## 2019-05-22 MED ORDER — TRAMADOL HCL 50 MG PO TABS: 50.0000 mg | ORAL_TABLET | Freq: Four times a day (QID) | ORAL | 5 refills | Status: DC | PRN

## 2019-05-22 NOTE — Progress Notes (Signed)
Pain Management Virtual Encounter Note - Virtual Visit via [place] (real-time audio visits between healthcare provider and patient).   Patient's Phone No. & Preferred Pharmacy:  [phone] (home); [phone] (mobile); (Preferred) [phone] [email or web address] .[facility] [phone] [address], [address] [place] [address] Phone: [phone] Fax: [phone]    Pre-screening note:  Our staff contacted Judy Allen and offered her an "in person", "face-to-face" appointment versus a telephone encounter. She indicated preferring the telephone encounter, at this time.   Reason for Virtual Visit: COVID-19*  Social distancing based on CDC and AMA recommendations.   I contacted Judy Allen on 05/22/2019 via video conference.      I clearly identified myself as Gillis Santa, MD. I verified that I was speaking with the correct person using two identifiers (Name: Judy Allen, and date of birth: 07-Aug-1956).  Advanced Informed Consent I sought verbal advanced consent from Judy Allen for virtual visit interactions. I informed Judy Allen of possible security and privacy concerns, risks, and limitations associated with providing "not-in-person" medical evaluation and management services. I also informed Judy Allen of the availability of "in-person" appointments. Finally, I informed her that there would be a charge for the virtual visit and that she could be  personally, fully or partially, financially responsible for it. Ms. Prom expressed understanding and agreed to proceed.   Historic Elements   Judy Allen is a 63 y.o. year old, female patient evaluated today after her last encounter by our practice on 05/21/2019. Judy Allen  has a past medical history of Anemia, Anxiety, Arthritis, Colon cancer ([place]) (3818), Complication of anesthesia, Constipation, COPD (chronic obstructive pulmonary disease)  ([place]), Depression, Diabetes mellitus without complication ([place]), Dysrhythmia, GERD (gastroesophageal reflux disease), H/O allergic rhinitis, Heart murmur, Hemorrhoid, Hyperlipidemia, Hypertension, Neck pain, chronic, Neuritis, Ovarian failure, Personal history of chemotherapy, Personal history of radiation therapy, PONV (postoperative nausea and vomiting), Pre-diabetes (2019), Proteinuria, Psoriasis, Sleep apnea (09/12/2016), Tachycardia, paroxysmal ([place]), and Urinary incontinence. She also  has a past surgical history that includes Tubal ligation; tonsillectomy (2993); herniated disc repair (2001); Anterior fusion lumbar spine (1990); Neck surgery; Mass excision (Left, 02/23/2016); Colonoscopy (2018); Portacath placement (Left, 03/12/2016); Cardiac catheterization (2009? ); Cardiac catheterization (05/2012); Port-a-cath removal (2018); Liver biopsy (N/A, 09/12/2017); Cholecystectomy (N/A, 09/12/2017); and Trigger finger release (Right, 09/2018). Judy Allen has a current medication list which includes the following prescription(s): albuterol, betamethasone dipropionate, blood glucose meter kit and supplies, bupropion, contour next test, divalproex, fluticasone, gabapentin, gemfibrozil, glucose blood, hydrocortisone, ketoconazole, loratadine, losartan, meloxicam, metformin, metoprolol succinate, microlet lancets, mirabegron er, omeprazole, oxybutynin, oxygen-helium, tiotropium, tizanidine, tramadol, triamcinolone cream, vascepa, venlafaxine xr, vitamin d (ergocalciferol), and anoro ellipta. She  reports that she quit smoking about 13 years ago. Her smoking use included cigarettes. She started smoking about 55 years ago. She has a 32.00 pack-year smoking history. She has never used smokeless tobacco. She reports that she does not drink alcohol or use drugs. Judy Allen is allergic to dapsone; hydrochlorothiazide; sulfa antibiotics; and sulfasalazine.   HPI  Today, she is being contacted for medication management.   No  change in medical history since last visit.  Patient's pain is at baseline.  Patient continues multimodal pain regimen as prescribed.  States that it provides pain relief and improvement in functional status.   Pharmacotherapy Assessment  Analgesic:  04/22/2019  1   02/20/2019  Tramadol Hcl 50  MG Tablet  120.00  30 Bi Lat   [id number]   Wal (5798)   2  20.00 MME  Comm Ins   [place]    Monitoring: Pharmacotherapy: No side-effects or adverse reactions reported. [place] PMP: PDMP reviewed during this encounter.       Compliance: No problems identified. Effectiveness: Clinically acceptable. Plan: Refer to "POC".  UDS:  Summary  Date Value Ref Range Status  08/31/2018 FINAL  Final    Comment:    ==================================================================== TOXASSURE SELECT 13 (MW) ==================================================================== Test                             Result       Flag       Units Drug Present and Declared for Prescription Verification   Hydromorphone                  353          EXPECTED   ng/mg creat   Norhydrocodone                 596          EXPECTED   ng/mg creat    Hydromorphone and norhydrocodone are expected metabolites of    hydrocodone. Sources of hydrocodone are scheduled prescription    medications.  Hydromorphone is also available as a scheduled    prescription medication. Drug Absent but Declared for Prescription Verification   Hydrocodone                    Not Detected UNEXPECTED ng/mg creat    Hydrocodone is almost always present in patients taking this drug    consistently. Absence of hydrocodone could be due to lapse of    time since the last dose or unusual pharmacokinetics (rapid    metabolism). ==================================================================== Test                      Result    Flag   Units      Ref Range   Creatinine              74               mg/dL       >=20 ==================================================================== Declared Medications:  The flagging and interpretation on this report are based on the  following declared medications.  Unexpected results may arise from  inaccuracies in the declared medications.  **Note: The testing scope of this panel includes these medications:  Hydrocodone (Norco)  **Note: The testing scope of this panel does not include following  reported medications:  Acetaminophen (Norco)  Albuterol  Betamethasone (Diprolene)  Divalproex (Depakote)  Estrogen (Premarin)  Fluticasone (Flonase)  Gabapentin (Neurontin)  Gemfibrozil (Lopid)  Hydrocortisone  Hydroxyzine  Icosapent Ethyl  Ketoconazole (Nizoral)  Losartan (Cozaar)  Meloxicam (Mobic)  Metformin (Glucophage)  Metoprolol (Toprol)  Mirabegron (Myrbetriq)  Omeprazole (Prilosec)  Oxybutynin (Ditropan)  Tiotropium (Spiriva)  Tizanidine (Zanaflex)  Tolterodine (Detrol)  Triamcinolone (Kenalog)  Venlafaxine (Effexor)  Vitamin D2 (Drisdol) ==================================================================== For clinical consultation, please call [phone]. ====================================================================    Laboratory Chemistry Profile (12 mo)  Renal: 04/23/2019: BUN 18; BUN/Creatinine Ratio NOT APPLICABLE; Creat 1.44  Lab Results  Component Value Date   GFRAA 75 04/23/2019   GFRNONAA 65 04/23/2019   Hepatic: No results found for requested labs within last 8760 hours. Lab Results  Component Value Date   AST  15 04/23/2019   ALT 15 04/23/2019   Other: 04/23/2019: Vit D, 25-Hydroxy 14 Note:   Assessment  The primary encounter diagnosis was Chronic pain syndrome. Diagnoses of Chronic cervical pain, Cervical radiculopathy (left), Cervicalgia, Myofascial pain syndrome, cervical, and Cervical facet joint syndrome were also pertinent to this visit.  Plan of Care  I have changed Judy Allen's traMADol and  tiotropium. I am also having her maintain her OXYGEN, hydrocortisone, albuterol, oxybutynin, meloxicam, fluticasone, metFORMIN, loratadine, blood glucose meter kit and supplies, Microlet Lancets, Contour Next Test, mirabegron ER, venlafaxine XR, divalproex, buPROPion, gemfibrozil, glucose blood, betamethasone dipropionate, ketoconazole, triamcinolone cream, Vascepa, losartan, omeprazole, metoprolol succinate, Vitamin D (Ergocalciferol), Anoro Ellipta, gabapentin, and tiZANidine.  General Recommendations: The pain condition that the patient suffers from is best treated with a multidisciplinary approach that involves an increase in physical activity to prevent de-conditioning and worsening of the pain cycle, as well as psychological counseling (formal and/or informal) to address the co-morbid psychological affects of pain. Treatment will often involve judicious use of pain medications and interventional procedures to decrease the pain, allowing the patient to participate in the physical activity that will ultimately produce long-lasting pain reductions. The goal of the multidisciplinary approach is to return the patient to a higher level of overall function and to restore their ability to perform activities of daily living.  Pharmacotherapy (Medications Ordered): Meds ordered this encounter  Medications  . gabapentin (NEURONTIN) 300 MG capsule    Sig: TAKE 1 CAPSULE(300 MG) BY MOUTH THREE TIMES DAILY    Dispense:  90 capsule    Refill:  5  . tiZANidine (ZANAFLEX) 4 MG tablet    Sig: Take 1 tablet (4 mg total) by mouth every 8 (eight) hours as needed.    Dispense:  90 tablet    Refill:  5  . traMADol (ULTRAM) 50 MG tablet    Sig: Take 1 tablet (50 mg total) by mouth every 6 (six) hours as needed for severe pain. Each fill must last 30 days    Dispense:  120 tablet    Refill:  5    [place] STOP ACT - Not applicable. Fill one day early if pharmacy is closed on scheduled refill date.  . tiotropium (SPIRIVA)  18 MCG inhalation capsule    Sig: Place 1 capsule (18 mcg total) into inhaler and inhale daily. [facility]    Dispense:  30 capsule   Orders:  Orders Placed This Encounter  Procedures  . ToxASSURE Select 13 (MW), Urine    Volume: 30 ml(s). Minimum 3 ml of urine is needed. Document temperature of fresh sample. Indications: Long term (current) use of opiate analgesic (W09.811)   Follow-up plan:   Return in about 6 months (around 11/20/2019) for Medication Management, in person.    Recent Visits No visits were found meeting these conditions.  Showing recent visits within past 90 days and meeting all other requirements   Today's Visits Date Type Provider Dept  05/22/19 Office Visit Gillis Santa, MD [facility]  Showing today's visits and meeting all other requirements   Future Appointments No visits were found meeting these conditions.  Showing future appointments within next 90 days and meeting all other requirements   I discussed the assessment and treatment plan with the patient. The patient was provided an opportunity to ask questions and all were answered. The patient agreed with the plan and demonstrated an understanding of the instructions.  Patient advised to call back or seek an in-person evaluation if  the symptoms or condition worsens.  Total duration of non-face-to-face encounter: 25 minutes.  Note by: Gillis Santa, MD Date: 05/22/2019; Time: 11:06 AM  Note: This dictation was prepared with Dragon dictation. Any transcriptional errors that may result from this process are unintentional.  Disclaimer:  * Given the special circumstances of the COVID-19 pandemic, the federal government has announced that the Office for Civil Rights (OCR) will exercise its enforcement discretion and will not impose penalties on physicians using telehealth in the event of noncompliance with regulatory requirements under the [place] (HIPAA) in  connection with the good faith provision of telehealth during the RXVQM-08 national public health emergency. ([place])

## 2019-06-12 ENCOUNTER — Other Ambulatory Visit: Payer: Self-pay | Admitting: Family Medicine

## 2019-06-12 DIAGNOSIS — I10 Essential (primary) hypertension: Secondary | ICD-10-CM

## 2019-06-12 NOTE — Telephone Encounter (Signed)
Requested Prescriptions  Pending Prescriptions Disp Refills  . losartan (COZAAR) 100 MG tablet [Pharmacy Med Name: LOSARTAN 100MG  TABLETS] 90 tablet 0    Sig: TAKE 1 TABLET(100 MG) BY MOUTH DAILY     Cardiovascular:  Angiotensin Receptor Blockers Failed - 06/12/2019  7:09 AM      Failed - Last BP in normal range    BP Readings from Last 1 Encounters:  04/20/19 (!) 144/89         Passed - Cr in normal range and within 180 days    Creat  Date Value Ref Range Status  04/23/2019 0.93 0.50 - 0.99 mg/dL Final    Comment:    For patients >36 years of age, the reference limit for Creatinine is approximately 13% higher for people identified as African-American. .          Passed - K in normal range and within 180 days    Potassium  Date Value Ref Range Status  04/23/2019 4.7 3.5 - 5.3 mmol/L Final  06/08/2012 3.5 3.5 - 5.1 mmol/L Final         Passed - Patient is not pregnant      Passed - Valid encounter within last 6 months    Recent Outpatient Visits          1 month ago Chronic recurrent major depressive disorder [facility])   [facility] [place], Drue Stager, MD   4 months ago Diabetes mellitus type 2 in obese [facility])   [facility] Steele Sizer, MD   7 months ago Chronic recurrent major depressive disorder [facility])   [facility] Steele Sizer, MD   1 year ago Dyslipidemia associated with type 2 diabetes mellitus [facility])   [facility] Steele Sizer, MD   1 year ago Centrilobular emphysema [facility])   [facility] Steele Sizer, MD      Future Appointments            In 2 months Ancil Boozer, Drue Stager, MD [facility] [place]   In 2 months MacDiarmid, Nicki Reaper, MD [place]   In 2 months Rubie Maid, MD [facility]

## 2019-07-02 ENCOUNTER — Ambulatory Visit: Payer: Self-pay | Admitting: Family Medicine

## 2019-07-03 ENCOUNTER — Ambulatory Visit: Payer: Self-pay | Admitting: Family Medicine

## 2019-07-16 ENCOUNTER — Encounter: Payer: Self-pay | Admitting: Family Medicine

## 2019-07-18 ENCOUNTER — Other Ambulatory Visit: Payer: Self-pay | Admitting: Family Medicine

## 2019-07-18 ENCOUNTER — Encounter: Payer: Self-pay | Admitting: Family Medicine

## 2019-07-18 ENCOUNTER — Other Ambulatory Visit: Payer: Self-pay

## 2019-07-18 ENCOUNTER — Ambulatory Visit (INDEPENDENT_AMBULATORY_CARE_PROVIDER_SITE_OTHER): Payer: Medicare Other | Admitting: Family Medicine

## 2019-07-18 DIAGNOSIS — Z1231 Encounter for screening mammogram for malignant neoplasm of breast: Secondary | ICD-10-CM | POA: Diagnosis not present

## 2019-07-18 DIAGNOSIS — N649 Disorder of breast, unspecified: Secondary | ICD-10-CM | POA: Diagnosis not present

## 2019-07-18 NOTE — Progress Notes (Signed)
Name: Judy Allen   MRN: [id number]    DOB: 1956/01/31   Date:07/18/2019       Progress Note  Subjective  Chief Complaint  Chief Complaint  Patient presents with  . Breast Pain    right breast changes under nipple     I connected with  [name]  on 07/18/19 at 10:40 AM EST by a video enabled telemedicine application and verified that I am speaking with the correct person using two identifiers.  I discussed the limitations of evaluation and management by telemedicine and the availability of in person appointments. The patient expressed understanding and agreed to proceed. Staff also discussed with the patient that there may be a patient responsible charge related to this service. Patient Location: Home Provider Location: Office Additional Individuals present: None  HPI  Pt presents with concern for changes in the right breast - she notes an area that looks like it is "sinking in" just under the right nipple.  She denies any other lumps that she can palpate in the breast, she endorses right breast tenderness; no nipple discharge.  She doe shave one maternal aunt who passed from breast cancer many years ago, no other family history.  Has had prior abnormal mammogram on the left breast that turned out to be nothing of concern.  [place] Location is preferred for imaging.  Patient Active Problem List   Diagnosis Date Noted  . Diabetes mellitus type 2 in obese ([place]) 11/06/2018  . Cervical radiculopathy (left) 08/31/2018  . Osteoarthritis of spine with radiculopathy, cervical region 08/31/2018  . History of anal cancer 05/30/2018  . Myofascial pain syndrome, cervical 05/16/2018  . Chronic pain syndrome 05/16/2018  . Cervical facet joint syndrome 05/16/2018  . Perihepatitis ([place]) 09/27/2017  . Abnormal liver function test 09/27/2017  . Fatty liver 12/22/2016  . Chronic respiratory failure with hypoxia, on home oxygen therapy ([place]) 07/28/2016  . Centrilobular emphysema ([place])  07/14/2016  . Abnormal Pap smear of cervix 04/14/2016  . Anal squamous cell carcinoma ([place]) 03/09/2016  . Metastatic squamous cell carcinoma ([place]) 02/25/2016  . Non-thrombocytopenic purpura ([place]) 01/12/2016  . Chronic constipation 10/16/2015  . History of fusion of cervical spine 04/02/2015  . Benign hypertension 01/16/2015  . Cervicalgia 01/16/2015  . CN (constipation) 01/16/2015  . Gastric reflux 01/16/2015  . Hypertriglyceridemia 01/16/2015  . Edema leg 01/16/2015  . Chronic recurrent major depressive disorder ([place]) 01/16/2015  . Dysmetabolic syndrome 82/99/3716  . Obstructive apnea 01/16/2015  . Psoriasis 01/16/2015  . Allergic rhinitis 01/16/2015  . Bursitis, trochanteric 01/16/2015  . GAD (generalized anxiety disorder) 01/16/2015  . Tachycardia, paroxysmal ([facility])     Social History   Tobacco Use  . Smoking status: Former Smoker    Packs/day: 1.00    Years: 32.00    Pack years: 32.00    Types: Cigarettes    Start date: 08/10/1963    Quit date: 01/15/2006    Years since quitting: 13.5  . Smokeless tobacco: Never Used  Substance Use Topics  . Alcohol use: No    Alcohol/week: 0.0 standard drinks     Current Outpatient Medications:  .  albuterol (PROVENTIL HFA;VENTOLIN HFA) 108 (90 Base) MCG/ACT inhaler, Inhale 2 puffs into the lungs every 6 (six) hours as needed for wheezing or shortness of breath., Disp: , Rfl:  .  ANORO ELLIPTA 62.5-25 MCG/INH AEPB, INL 1 PUFF PO D, Disp: , Rfl:  .  betamethasone dipropionate (DIPROLENE) 0.05 % cream, Apply 1 application topically 2 (  two) times daily as needed. Avoid face , groin and axilla, Disp: 45 g, Rfl: 0 .  blood glucose meter kit and supplies, Dispense based on patient and insurance preference. Use up to four times daily as directed. (FOR ICD-10 E10.9, E11.9)., Disp: 1 each, Rfl: 0 .  buPROPion (WELLBUTRIN XL) 150 MG 24 hr tablet, TAKE 1 TABLET(150 MG) BY MOUTH DAILY, Disp: 90 tablet, Rfl: 1 .  CONTOUR NEXT TEST test strip, USE UP  TO FOUR TIMES DAILY AS DIRECTED, Disp: 100 each, Rfl: 5 .  divalproex (DEPAKOTE) 250 MG DR tablet, Take 1 tablet (250 mg total) by mouth at bedtime., Disp: 90 tablet, Rfl: 1 .  fluticasone (FLONASE) 50 MCG/ACT nasal spray, Place 2 sprays into both nostrils daily., Disp: 16 g, Rfl: 2 .  gabapentin (NEURONTIN) 300 MG capsule, TAKE 1 CAPSULE(300 MG) BY MOUTH THREE TIMES DAILY, Disp: 90 capsule, Rfl: 5 .  gemfibrozil (LOPID) 600 MG tablet, Take 1 tablet by mouth 2 (two) times a day., Disp: , Rfl:  .  glucose blood test strip, Contour Next Test Strips, Disp: , Rfl:  .  hydrocortisone 2.5 % cream, Apply 1 application topically 2 (two) times daily., Disp: , Rfl:  .  ketoconazole (NIZORAL) 2 % cream, Apply 1 application topically 2 (two) times daily., Disp: 60 g, Rfl: 0 .  loratadine (CLARITIN) 10 MG tablet, Take 1 tablet (10 mg total) by mouth daily., Disp: 90 tablet, Rfl: 1 .  losartan (COZAAR) 100 MG tablet, TAKE 1 TABLET(100 MG) BY MOUTH DAILY, Disp: 90 tablet, Rfl: 0 .  meloxicam (MOBIC) 15 MG tablet, TK 1 T PO QD, Disp: , Rfl:  .  metFORMIN (GLUCOPHAGE-XR) 500 MG 24 hr tablet, Take 2 tablets (1,000 mg total) by mouth daily., Disp: 180 tablet, Rfl: 1 .  metoprolol succinate (TOPROL-XL) 50 MG 24 hr tablet, TAKE 1 TABLET(50 MG) BY MOUTH DAILY, Disp: 90 tablet, Rfl: 1 .  Microlet Lancets MISC, USE FOUR TIMES DAILY, Disp: 100 each, Rfl: 5 .  mirabegron ER (MYRBETRIQ) 50 MG TB24 tablet, Take 1 tablet (50 mg total) by mouth daily., Disp: 30 tablet, Rfl: 11 .  omeprazole (PRILOSEC) 40 MG capsule, TAKE 1 CAPSULE(40 MG) BY MOUTH DAILY, Disp: 90 capsule, Rfl: 1 .  oxybutynin (DITROPAN-XL) 10 MG 24 hr tablet, oxybutynin chloride ER 10 mg tablet,extended release 24 hr  TAKE 1 TABLET BY MOUTH DAILY, Disp: , Rfl:  .  OXYGEN, Inhale 2 L into the lungs continuous. , Disp: , Rfl:  .  tiotropium (SPIRIVA) 18 MCG inhalation capsule, Place 1 capsule (18 mcg total) into inhaler and inhale daily. [facility], Disp: 30 capsule,  Rfl:  .  tiZANidine (ZANAFLEX) 4 MG tablet, Take 1 tablet (4 mg total) by mouth every 8 (eight) hours as needed., Disp: 90 tablet, Rfl: 5 .  traMADol (ULTRAM) 50 MG tablet, Take 1 tablet (50 mg total) by mouth every 6 (six) hours as needed for severe pain. Each fill must last 30 days, Disp: 120 tablet, Rfl: 5 .  triamcinolone cream (KENALOG) 0.1 %, Apply topically 2 (two) times daily., Disp: 30 g, Rfl: 0 .  VASCEPA 1 g CAPS, TAKE 2 CAPSULES(2 GRAMS) BY MOUTH TWICE DAILY, Disp: 360 capsule, Rfl: 1 .  venlafaxine XR (EFFEXOR-XR) 75 MG 24 hr capsule, TAKE 3 CAPSULES BY MOUTH DAILY WITH BREAKFAST, Disp: 270 capsule, Rfl: 1 .  Vitamin D, Ergocalciferol, (DRISDOL) 1.25 MG (50000 UT) CAPS capsule, Take 1 capsule (50,000 Units total) by mouth every 7 (seven) days.,  Disp: 12 capsule, Rfl: 1  Allergies  Allergen Reactions  . Dapsone Other (See Comments)    Hypoxemia  . Hydrochlorothiazide     History of hyperparathyroidism   . Sulfa Antibiotics Rash  . Sulfasalazine Rash    I personally reviewed active problem list, medication list, allergies, notes from last encounter, lab results, imaging with the patient/caregiver today.  ROS Constitutional: Negative for fever or weight change.  Respiratory: Negative for cough and shortness of breath.   Cardiovascular: Negative for chest pain or palpitations.  Gastrointestinal: Negative for abdominal pain, no bowel changes.  Musculoskeletal: Negative for gait problem or joint swelling.  Skin: Negative for rash.  Neurological: Negative for dizziness or headache.  No other specific complaints in a complete review of systems (except as listed in HPI above).  Objective  Virtual encounter, vitals not obtained.  There is no height or weight on file to calculate BMI.  Nursing Note and Vital Signs reviewed.  Physical Exam  Constitutional: Patient appears well-developed and well-nourished. No distress.  HENT: Head: Normocephalic and atraumatic.  Neck:  Normal range of motion. Pulmonary/Chest: Effort normal. No respiratory distress. Speaking in complete sentences Neurological: Pt is alert and oriented to person, place, and time. Coordination, speech and gait are normal.  Psychiatric: Patient has a normal mood and affect. behavior is normal. Judgment and thought content normal. Breast: RIGHT areola has some dimpling present that is not present on the LEFT areola.  Breasts otherwise do appear symmetric.  She reports having self-palpated bilateral breasts and has not found other areas of concern.   No results found for this or any previous visit (from the past 72 hour(s)).  Assessment & Plan  1. Abnormal nipple - MM Digital Diagnostic Bilat; Future *She is aware that I have a very limited examination and would prefer in person, however she is trying to limit her exposure to COVID-19 during a surge in cases in our area.    2. Encounter for screening mammogram for malignant neoplasm of breast - MM Digital Diagnostic Bilat; Future  - I discussed the assessment and treatment plan with the patient. The patient was provided an opportunity to ask questions and all were answered. The patient agreed with the plan and demonstrated an understanding of the instructions.  I provided 14 minutes of non-face-to-face time during this encounter.  Hubbard Hartshorn, FNP

## 2019-07-27 ENCOUNTER — Ambulatory Visit
Admission: RE | Admit: 2019-07-27 | Discharge: 2019-07-27 | Disposition: A | Discharge status: Home / Self Care | Payer: Medicare Other | Source: Ambulatory Visit | Attending: Family Medicine | Admitting: Family Medicine

## 2019-07-27 ENCOUNTER — Other Ambulatory Visit: Payer: Self-pay

## 2019-07-27 DIAGNOSIS — N649 Disorder of breast, unspecified: Secondary | ICD-10-CM

## 2019-07-27 DIAGNOSIS — Z1231 Encounter for screening mammogram for malignant neoplasm of breast: Secondary | ICD-10-CM | POA: Diagnosis present

## 2019-07-31 ENCOUNTER — Encounter: Payer: Self-pay | Admitting: Family Medicine

## 2019-08-01 ENCOUNTER — Encounter: Payer: Self-pay | Admitting: Family Medicine

## 2019-08-06 ENCOUNTER — Other Ambulatory Visit: Payer: Self-pay | Admitting: Family Medicine

## 2019-08-06 ENCOUNTER — Ambulatory Visit: Payer: Self-pay

## 2019-08-06 DIAGNOSIS — F339 Major depressive disorder, recurrent, unspecified: Secondary | ICD-10-CM

## 2019-08-06 DIAGNOSIS — F411 Generalized anxiety disorder: Secondary | ICD-10-CM

## 2019-08-06 NOTE — Telephone Encounter (Signed)
Was asked by pt's daughter to call pt with c/o elevated BP and covid like sx.  Onset of HTN 3 week ago. Today BP 171/99 and during triage BP 158/97. Pt stated she is having vertigo that comes and goes, nausea, weakness, constant "shakiness", mild headache. Pt has been exposed to covid 19 through her son in law. Pt also stated that she has had intermittent numbness and tingling of her face for a week or so.  Pt advised to call 911. Pt stated she is 5 minutes away from hospital and will leave after call is over. Advised pt to call 911 if unable to leave for hospital immediately. Advised pt that she is at risk for a neurological event and since she has had an exposure to covid and is having covid like sx, that she needs to get tested for covid as well.  Care advice given, pt verbalized understanding. Called Dr Ancil Boozer office to speak to her CMA, but was informed she is not available to take call.  Reason for Disposition . [2] Systolic BP  >= 440 OR Diastolic >= 102 AND [7] cardiac or neurologic symptoms (e.g., chest pain, difficulty breathing, unsteady gait, blurred vision) . [1] Numbness (i.e., loss of sensation) of the face, arm or leg on one side of the body AND [2] new onset  Answer Assessment - Initial Assessment Questions 1. BLOOD PRESSURE: "What is the blood pressure?" "Did you take at least two measurements 5 minutes apart?"     171/99 at 2 pm. 158/97 during call 3 weeks ago BP started 2. ONSET: "When did you take your blood pressure?"     2 pm  3. HOW: "How did you obtain the blood pressure?" (e.g., visiting nurse, automatic home BP monitor)     Automatic cuff BP 4. HISTORY: "Do you have a history of high blood pressure?"     yes 5. MEDICATIONS: "Are you taking any medications for blood pressure?" "Have you missed any doses recently?"     Yes- no 6. OTHER SYMPTOMS: "Do you have any symptoms?" (e.g., headache, chest pain, blurred vision, difficulty breathing, weakness)     Nausea, weakness,  shakiness all the time, headache mild, vertigo (comes and goes- last episode 2 hours ago)  7. PREGNANCY: "Is there any chance you are pregnant?" "When was your last menstrual period?"     n/a  Protocols used: HIGH BLOOD PRESSURE-A-AH

## 2019-08-21 ENCOUNTER — Ambulatory Visit (INDEPENDENT_AMBULATORY_CARE_PROVIDER_SITE_OTHER): Payer: Medicare HMO | Admitting: Family Medicine

## 2019-08-21 ENCOUNTER — Encounter: Payer: Self-pay | Admitting: Family Medicine

## 2019-08-21 VITALS — BP 143/91 | HR 83 | Ht 64.0 in | Wt 186.0 lb

## 2019-08-21 DIAGNOSIS — E559 Vitamin D deficiency, unspecified: Secondary | ICD-10-CM | POA: Diagnosis not present

## 2019-08-21 DIAGNOSIS — J432 Centrilobular emphysema: Secondary | ICD-10-CM

## 2019-08-21 DIAGNOSIS — E213 Hyperparathyroidism, unspecified: Secondary | ICD-10-CM | POA: Diagnosis not present

## 2019-08-21 DIAGNOSIS — M542 Cervicalgia: Secondary | ICD-10-CM | POA: Diagnosis not present

## 2019-08-21 DIAGNOSIS — F411 Generalized anxiety disorder: Secondary | ICD-10-CM

## 2019-08-21 DIAGNOSIS — D692 Other nonthrombocytopenic purpura: Secondary | ICD-10-CM

## 2019-08-21 DIAGNOSIS — I479 Paroxysmal tachycardia, unspecified: Secondary | ICD-10-CM

## 2019-08-21 DIAGNOSIS — E781 Pure hyperglyceridemia: Secondary | ICD-10-CM

## 2019-08-21 DIAGNOSIS — F339 Major depressive disorder, recurrent, unspecified: Secondary | ICD-10-CM | POA: Diagnosis not present

## 2019-08-21 DIAGNOSIS — E785 Hyperlipidemia, unspecified: Secondary | ICD-10-CM

## 2019-08-21 DIAGNOSIS — E1169 Type 2 diabetes mellitus with other specified complication: Secondary | ICD-10-CM | POA: Diagnosis not present

## 2019-08-21 DIAGNOSIS — I10 Essential (primary) hypertension: Secondary | ICD-10-CM | POA: Diagnosis not present

## 2019-08-21 DIAGNOSIS — Z9981 Dependence on supplemental oxygen: Secondary | ICD-10-CM

## 2019-08-21 DIAGNOSIS — R69 Illness, unspecified: Secondary | ICD-10-CM | POA: Diagnosis not present

## 2019-08-21 DIAGNOSIS — J9611 Chronic respiratory failure with hypoxia: Secondary | ICD-10-CM

## 2019-08-21 DIAGNOSIS — K219 Gastro-esophageal reflux disease without esophagitis: Secondary | ICD-10-CM

## 2019-08-21 DIAGNOSIS — G8929 Other chronic pain: Secondary | ICD-10-CM

## 2019-08-21 MED ORDER — LOSARTAN POTASSIUM 100 MG PO TABS: 100.0000 mg | ORAL_TABLET | Freq: Every day | ORAL | 1 refills | Status: DC

## 2019-08-21 MED ORDER — VENLAFAXINE HCL ER 75 MG PO CP24: 225.0000 mg | ORAL_CAPSULE | Freq: Every day | ORAL | 2 refills | Status: DC

## 2019-08-21 MED ORDER — BUPROPION HCL ER (XL) 150 MG PO TB24: ORAL_TABLET | ORAL | 1 refills | Status: DC

## 2019-08-21 MED ORDER — DIVALPROEX SODIUM 250 MG PO DR TAB: 250.0000 mg | DELAYED_RELEASE_TABLET | Freq: Every day | ORAL | 1 refills | Status: DC

## 2019-08-21 MED ORDER — OMEPRAZOLE 40 MG PO CPDR: 40.0000 mg | DELAYED_RELEASE_CAPSULE | Freq: Every day | ORAL | 1 refills | Status: DC

## 2019-08-21 MED ORDER — OMEGA-3-ACID ETHYL ESTERS 1 G PO CAPS: 2.0000 g | ORAL_CAPSULE | Freq: Two times a day (BID) | ORAL | 1 refills | Status: DC

## 2019-08-21 MED ORDER — METOPROLOL SUCCINATE ER 50 MG PO TB24: 50.0000 mg | ORAL_TABLET | Freq: Every day | ORAL | 1 refills | Status: DC

## 2019-08-21 NOTE — Progress Notes (Signed)
Name: Judy Allen   MRN: [id number]    DOB: [birth year]   Date:08/21/2019       Progress Note  Subjective  Chief Complaint  Chief Complaint  Patient presents with  . Medication Refill  . Depression  . Diabetes    Average BS 92-113  . Hypertension    mild dizziness, mild headaches but no swelling  . History of anal cancer  . Pain    I connected with  [name] on 08/21/19 at  2:40 PM EST by telephone and verified that I am speaking with the correct person using two identifiers.  I discussed the limitations, risks, security and privacy concerns of performing an evaluation and management service by telephone and the availability of in person appointments. Staff also discussed with the patient that there may be a patient responsible charge related to this service. Patient Location: at home Provider Location: [facility]   HPI  Hypertriglyceridemia: seeing Dr. Peter Congo is off Lovaza because of cost, tried Vascepa not covered, off gemfibrozil, we will try sending lovaza to [name]  HTN: seen by pulmonologist and advised to stop HCTZ because calcium was high, she has hyperparathyroidism,bp slightly up today when checked at home but doing better on Losartan and metoprolol   Hyperparathyroidism:calciumhad goneback to normal since off hctz, however labs done at [facility] this past week showed elevation again, denies cramps, no kidney stone. We will recheck labs before her next follow up   Emphysema and hypoxemia:on oxygendown to 2 liters - 24 hours per day sob is stable, no cough or wheezing at this time, still taking Anoro  and albuterol prn. Under the care of pulmonologist at [place] Depression:she has been depressed for many years, got worse after her husband died in 01-15-2018, she bounced back, however conflict with youngest daughter - [name] going on for about one year, did not feel welcomed, felt like she had to clean all the time and cooking,  she was having crying spells. She moved in to her oldest daughter July 25 th, 2020 to watch grandson while her daughter went on her honey moon and has stayed there since. Her phq 9 was down to 2, but she states she had a gap with coverage with insurance and was without medication for 2 weeks and now on lower dose of medication , she is now on 75 mg daily instead of 225 mg and is titrating dose up slowly   History of anal cancer: still going to [facility] for follow up, every 6 months for radiation oncologist and every 6 months with oncologist in alternating fashion.She denies change in bowel movements of blood in stools   DMII: diagnosed 02/2018 by Dr. Gabriel Carina, hgbA1C was at 6.6% she is nowon metformin, tolerating well, and last hgbA1C down to 6.3%back in March and down to 5.8 % September 2020 , she skipped visit with Dr. Gabriel Carina because of COVID-19. She denies side effects of medication. We will ask her to have labs done in the next month since no visits scheduled with Dr. Gabriel Carina and last visit was virtual with her She denies polyphagia but has polydipsia and polyuria   Chronic pain: under the care of Dr. Tiburcio Bash states she is doing well on Tramadol, also taking muscle relaxer . Pain is worse on her neck, doing well , pain right now is 1/10 sometimes not pain.   Patient Active Problem List   Diagnosis Date Noted  . Diabetes mellitus type 2 in  obese ([place]) 11/06/2018  . Cervical radiculopathy (left) 08/31/2018  . Osteoarthritis of spine with radiculopathy, cervical region 08/31/2018  . History of anal cancer 05/30/2018  . Myofascial pain syndrome, cervical 05/16/2018  . Chronic pain syndrome 05/16/2018  . Cervical facet joint syndrome 05/16/2018  . Perihepatitis ([place]) 09/27/2017  . Abnormal liver function test 09/27/2017  . Fatty liver 12/22/2016  . Chronic respiratory failure with hypoxia, on home oxygen therapy ([place]) 07/28/2016  . Centrilobular emphysema ([place]) 07/14/2016  . Abnormal Pap smear  of cervix 04/14/2016  . Anal squamous cell carcinoma ([place]) 03/09/2016  . Metastatic squamous cell carcinoma ([place]) 02/25/2016  . Non-thrombocytopenic purpura ([place]) 01/12/2016  . Chronic constipation 10/16/2015  . History of fusion of cervical spine 04/02/2015  . Benign hypertension 01/16/2015  . Cervicalgia 01/16/2015  . CN (constipation) 01/16/2015  . Gastric reflux 01/16/2015  . Hypertriglyceridemia 01/16/2015  . Edema leg 01/16/2015  . Chronic recurrent major depressive disorder ([place]) 01/16/2015  . Dysmetabolic syndrome 81/44/8185  . Obstructive apnea 01/16/2015  . Psoriasis 01/16/2015  . Allergic rhinitis 01/16/2015  . Bursitis, trochanteric 01/16/2015  . GAD (generalized anxiety disorder) 01/16/2015  . Tachycardia, paroxysmal [facility])     Past Surgical History:  Procedure Laterality Date  . ANTERIOR FUSION LUMBAR SPINE  1990   plate in back, not metal  . CARDIAC CATHETERIZATION  2009?    [facility];[place]  . CARDIAC CATHETERIZATION  05/2012   [facility]: Mild hypertension 37/12 with a mean pressure of 21 mm mercury  . CHOLECYSTECTOMY N/A 09/12/2017   Procedure: LAPAROSCOPIC CHOLECYSTECTOMY WITH INTRAOPERATIVE CHOLANGIOGRAM;  Surgeon: Robert Bellow, MD;  Location: [facility] ORS;  Service: General;  Laterality: N/A;  . COLONOSCOPY  2018  . herniated disc repair  2001  . LIVER BIOPSY N/A 09/12/2017   Procedure: LIVER BIOPSY;  Surgeon: Robert Bellow, MD;  Location: [facility] ORS;  Service: General;  Laterality: N/A;  . MASS EXCISION Left 02/23/2016   Procedure: EXCISION MASS;  Surgeon: Christene Lye, MD;  Location: [facility] ORS;  Service: General;  Laterality: Left;  . NECK SURGERY    . PORT-A-CATH REMOVAL  2018  . PORTACATH PLACEMENT Left 03/12/2016   Procedure: INSERTION PORT-A-CATH;  Surgeon: Christene Lye, MD;  Location: [facility] ORS;  Service: General;  Laterality: Left;  . tonsillectomy  1959   history  . TRIGGER FINGER RELEASE Right 09/2018   Dr. Sabra Heck   . TUBAL LIGATION     s/p     Family History  Problem Relation Age of Onset  . Heart attack Mother   . Asthma Mother   . Liver cancer Father   . Breast cancer Maternal Aunt   . Bladder Cancer Neg Hx   . Kidney cancer Neg Hx       Current Outpatient Medications:  .  albuterol (PROVENTIL HFA;VENTOLIN HFA) 108 (90 Base) MCG/ACT inhaler, Inhale 2 puffs into the lungs every 6 (six) hours as needed for wheezing or shortness of breath., Disp: , Rfl:  .  ANORO ELLIPTA 62.5-25 MCG/INH AEPB, INL 1 PUFF PO D, Disp: , Rfl:  .  betamethasone dipropionate (DIPROLENE) 0.05 % cream, Apply 1 application topically 2 (two) times daily as needed. Avoid face , groin and axilla, Disp: 45 g, Rfl: 0 .  blood glucose meter kit and supplies, Dispense based on patient and insurance preference. Use up to four times daily as directed. (FOR ICD-10 E10.9, E11.9)., Disp: 1 each, Rfl: 0 .  buPROPion (WELLBUTRIN XL) 150 MG 24 hr tablet, TAKE 1  TABLET(150 MG) BY MOUTH DAILY, Disp: 90 tablet, Rfl: 1 .  CONTOUR NEXT TEST test strip, USE UP TO FOUR TIMES DAILY AS DIRECTED, Disp: 100 each, Rfl: 5 .  divalproex (DEPAKOTE) 250 MG DR tablet, Take 1 tablet (250 mg total) by mouth at bedtime., Disp: 90 tablet, Rfl: 1 .  fluticasone (FLONASE) 50 MCG/ACT nasal spray, Place 2 sprays into both nostrils daily., Disp: 16 g, Rfl: 2 .  gabapentin (NEURONTIN) 300 MG capsule, TAKE 1 CAPSULE(300 MG) BY MOUTH THREE TIMES DAILY, Disp: 90 capsule, Rfl: 5 .  glucose blood test strip, Contour Next Test Strips, Disp: , Rfl:  .  hydrocortisone 2.5 % cream, Apply 1 application topically 2 (two) times daily., Disp: , Rfl:  .  ketoconazole (NIZORAL) 2 % cream, Apply 1 application topically 2 (two) times daily., Disp: 60 g, Rfl: 0 .  loratadine (CLARITIN) 10 MG tablet, Take 1 tablet (10 mg total) by mouth daily., Disp: 90 tablet, Rfl: 1 .  losartan (COZAAR) 100 MG tablet, Take 1 tablet (100 mg total) by mouth daily., Disp: 90 tablet, Rfl: 1 .  meloxicam (MOBIC) 15 MG tablet, TK  1 T PO QD, Disp: , Rfl:  .  metFORMIN (GLUCOPHAGE-XR) 500 MG 24 hr tablet, Take 2 tablets (1,000 mg total) by mouth daily., Disp: 180 tablet, Rfl: 1 .  metoprolol succinate (TOPROL-XL) 50 MG 24 hr tablet, Take 1 tablet (50 mg total) by mouth daily. Take with or immediately following a meal., Disp: 90 tablet, Rfl: 1 .  Microlet Lancets MISC, USE FOUR TIMES DAILY, Disp: 100 each, Rfl: 5 .  mirabegron ER (MYRBETRIQ) 50 MG TB24 tablet, Take 1 tablet (50 mg total) by mouth daily., Disp: 30 tablet, Rfl: 11 .  naproxen (NAPROSYN) 500 MG tablet, Take by mouth., Disp: , Rfl:  .  omeprazole (PRILOSEC) 40 MG capsule, Take 1 capsule (40 mg total) by mouth daily., Disp: 90 capsule, Rfl: 1 .  ondansetron (ZOFRAN-ODT) 4 MG disintegrating tablet, , Disp: , Rfl:  .  oxybutynin (DITROPAN-XL) 10 MG 24 hr tablet, oxybutynin chloride ER 10 mg tablet,extended release 24 hr  TAKE 1 TABLET BY MOUTH DAILY, Disp: , Rfl:  .  OXYGEN, Inhale 2 L into the lungs continuous. , Disp: , Rfl:  .  tiZANidine (ZANAFLEX) 4 MG tablet, Take 1 tablet (4 mg total) by mouth every 8 (eight) hours as needed., Disp: 90 tablet, Rfl: 5 .  traMADol (ULTRAM) 50 MG tablet, Take 1 tablet (50 mg total) by mouth every 6 (six) hours as needed for severe pain. Each fill must last 30 days, Disp: 120 tablet, Rfl: 5 .  triamcinolone cream (KENALOG) 0.1 %, Apply topically 2 (two) times daily., Disp: 30 g, Rfl: 0 .  venlafaxine XR (EFFEXOR-XR) 75 MG 24 hr capsule, Take 3 capsules (225 mg total) by mouth daily with breakfast., Disp: 90 capsule, Rfl: 2 .  Vitamin D, Ergocalciferol, (DRISDOL) 1.25 MG (50000 UT) CAPS capsule, Take 1 capsule (50,000 Units total) by mouth every 7 (seven) days., Disp: 12 capsule, Rfl: 1 .  omega-3 acid ethyl esters (LOVAZA) 1 g capsule, Take 2 capsules (2 g total) by mouth 2 (two) times daily., Disp: 360 capsule, Rfl: 1  Allergies  Allergen Reactions  . Dapsone Other (See Comments)    Hypoxemia  . Hydrochlorothiazide      History of hyperparathyroidism   . Sulfa Antibiotics Rash  . Sulfasalazine Rash    I personally reviewed active problem list, medication list, allergies, family history, social  history, health maintenance with the patient/caregiver today.   ROS  Ten systems reviewed and is negative except as mentioned in HPI   Objective  Virtual encounter, vitals  Obtained at home  Vitals:   08/21/19 1327  BP: (!) 143/91  Pulse: 83    Body mass index is 31.93 kg/m.  Physical Exam  Awake, alert and oriented   PHQ2/9: Depression screen [facility] 2/9 08/21/2019 07/18/2019 04/20/2019 02/06/2019 10/23/2018  Decreased Interest 1 1 0 3 3  Down, Depressed, Hopeless 1 1 0 1 2  PHQ - 2 Score 2 2 0 4 5  Altered sleeping 3 1 1  0 1  Tired, decreased energy 3 1 1 2 2   Change in appetite 2 1 0 0 0  Feeling bad or failure about yourself  3 1 0 1 3  Trouble concentrating 0 3 0 0 0  Moving slowly or fidgety/restless 1 1 0 0 0  Suicidal thoughts 0 - 0 0 1  PHQ-9 Score 14 10 2 7 12   Difficult doing work/chores Somewhat difficult Somewhat difficult Not difficult at all Not difficult at all Somewhat difficult  Some recent data might be hidden   PHQ-2/9 Result is positive.    Fall Risk: Fall Risk  08/21/2019 07/18/2019 04/20/2019 02/06/2019 09/04/2018  Falls in the past year? 0 0 0 0 0  Number falls in past yr: 0 0 0 0 -  Injury with Fall? 0 0 0 0 -  Follow up - - Falls evaluation completed - -     Assessment & Plan  1. Dyslipidemia associated with type 2 diabetes mellitus (HCC)  - Hemoglobin A1c  2. Hyperparathyroidism ([place])  - Comprehensive metabolic panel  3. Vitamin D deficiency  - VITAMIN D 25 Hydroxy (Vit-D Deficiency, Fractures)  4. Chronic recurrent major depressive disorder (HCC)  - venlafaxine XR (EFFEXOR-XR) 75 MG 24 hr capsule; Take 3 capsules (225 mg total) by mouth daily with breakfast.  Dispense: 90 capsule; Refill: 2 - buPROPion (WELLBUTRIN XL) 150 MG 24 hr tablet; TAKE 1 TABLET(150  MG) BY MOUTH DAILY  Dispense: 90 tablet; Refill: 1 - divalproex (DEPAKOTE) 250 MG DR tablet; Take 1 tablet (250 mg total) by mouth at bedtime.  Dispense: 90 tablet; Refill: 1  5. Benign hypertension  - metoprolol succinate (TOPROL-XL) 50 MG 24 hr tablet; Take 1 tablet (50 mg total) by mouth daily. Take with or immediately following a meal.  Dispense: 90 tablet; Refill: 1 - losartan (COZAAR) 100 MG tablet; Take 1 tablet (100 mg total) by mouth daily.  Dispense: 90 tablet; Refill: 1  6. Chronic neck pain  Doing better under the care of Dr. Holley Raring, pain under control   7. Centrilobular emphysema ([place])  On lower dose of oxygen since on Anoro  8. Hypertriglyceridemia  Insurance denied but she will try goodrx at [facility]  - omega-3 acid ethyl esters (LOVAZA) 1 g capsule; Take 2 capsules (2 g total) by mouth 2 (two) times daily.  Dispense: 360 capsule; Refill: 1  9. Senile purpura ([place])   10. Chronic respiratory failure with hypoxia, on home oxygen therapy (HCC)   11. Hypertension, benign   12. GAD (generalized anxiety disorder)  - venlafaxine XR (EFFEXOR-XR) 75 MG 24 hr capsule; Take 3 capsules (225 mg total) by mouth daily with breakfast.  Dispense: 90 capsule; Refill: 2  13. Tachycardia, paroxysmal (HCC)  - metoprolol succinate (TOPROL-XL) 50 MG 24 hr tablet; Take 1 tablet (50 mg total) by mouth daily. Take with or  immediately following a meal.  Dispense: 90 tablet; Refill: 1  14. GERD without esophagitis  - omeprazole (PRILOSEC) 40 MG capsule; Take 1 capsule (40 mg total) by mouth daily.  Dispense: 90 capsule; Refill: 1 I discussed the assessment and treatment plan with the patient. The patient was provided an opportunity to ask questions and all were answered. The patient agreed with the plan and demonstrated an understanding of the instructions.   The patient was advised to call back or seek an in-person evaluation if the symptoms worsen or if the condition fails to  improve as anticipated.  I provided 25  minutes of non-face-to-face time during this encounter.  Loistine Chance, MD

## 2019-08-23 ENCOUNTER — Encounter: Payer: Self-pay | Admitting: Family Medicine

## 2019-08-25 DIAGNOSIS — E213 Hyperparathyroidism, unspecified: Secondary | ICD-10-CM | POA: Insufficient documentation

## 2019-08-25 DIAGNOSIS — E119 Type 2 diabetes mellitus without complications: Secondary | ICD-10-CM | POA: Diagnosis not present

## 2019-08-25 DIAGNOSIS — E785 Hyperlipidemia, unspecified: Secondary | ICD-10-CM | POA: Diagnosis not present

## 2019-08-25 DIAGNOSIS — J9621 Acute and chronic respiratory failure with hypoxia: Secondary | ICD-10-CM | POA: Diagnosis not present

## 2019-08-25 DIAGNOSIS — J449 Chronic obstructive pulmonary disease, unspecified: Secondary | ICD-10-CM | POA: Diagnosis not present

## 2019-08-25 DIAGNOSIS — J9611 Chronic respiratory failure with hypoxia: Secondary | ICD-10-CM | POA: Diagnosis not present

## 2019-08-25 DIAGNOSIS — Z7984 Long term (current) use of oral hypoglycemic drugs: Secondary | ICD-10-CM | POA: Diagnosis not present

## 2019-08-25 DIAGNOSIS — R69 Illness, unspecified: Secondary | ICD-10-CM | POA: Diagnosis not present

## 2019-08-25 DIAGNOSIS — R531 Weakness: Secondary | ICD-10-CM | POA: Diagnosis not present

## 2019-08-25 DIAGNOSIS — J441 Chronic obstructive pulmonary disease with (acute) exacerbation: Secondary | ICD-10-CM | POA: Diagnosis not present

## 2019-08-25 DIAGNOSIS — I10 Essential (primary) hypertension: Secondary | ICD-10-CM | POA: Diagnosis not present

## 2019-08-25 DIAGNOSIS — R42 Dizziness and giddiness: Secondary | ICD-10-CM | POA: Diagnosis not present

## 2019-08-25 DIAGNOSIS — K219 Gastro-esophageal reflux disease without esophagitis: Secondary | ICD-10-CM | POA: Diagnosis not present

## 2019-08-25 DIAGNOSIS — G8929 Other chronic pain: Secondary | ICD-10-CM | POA: Diagnosis not present

## 2019-08-25 DIAGNOSIS — Z6831 Body mass index (BMI) 31.0-31.9, adult: Secondary | ICD-10-CM | POA: Diagnosis not present

## 2019-08-25 DIAGNOSIS — Z20822 Contact with and (suspected) exposure to covid-19: Secondary | ICD-10-CM | POA: Diagnosis not present

## 2019-08-25 DIAGNOSIS — F329 Major depressive disorder, single episode, unspecified: Secondary | ICD-10-CM | POA: Diagnosis not present

## 2019-08-25 DIAGNOSIS — R0902 Hypoxemia: Secondary | ICD-10-CM | POA: Diagnosis not present

## 2019-08-25 DIAGNOSIS — R05 Cough: Secondary | ICD-10-CM | POA: Diagnosis not present

## 2019-08-26 DIAGNOSIS — G8929 Other chronic pain: Secondary | ICD-10-CM | POA: Diagnosis not present

## 2019-08-26 DIAGNOSIS — J9611 Chronic respiratory failure with hypoxia: Secondary | ICD-10-CM | POA: Diagnosis not present

## 2019-08-26 DIAGNOSIS — J441 Chronic obstructive pulmonary disease with (acute) exacerbation: Secondary | ICD-10-CM | POA: Diagnosis not present

## 2019-08-26 DIAGNOSIS — R69 Illness, unspecified: Secondary | ICD-10-CM | POA: Diagnosis not present

## 2019-08-26 DIAGNOSIS — E119 Type 2 diabetes mellitus without complications: Secondary | ICD-10-CM | POA: Diagnosis not present

## 2019-08-26 DIAGNOSIS — I10 Essential (primary) hypertension: Secondary | ICD-10-CM | POA: Diagnosis not present

## 2019-08-26 DIAGNOSIS — Z6831 Body mass index (BMI) 31.0-31.9, adult: Secondary | ICD-10-CM | POA: Diagnosis not present

## 2019-08-27 ENCOUNTER — Ambulatory Visit: Payer: BLUE CROSS/BLUE SHIELD | Admitting: Urology

## 2019-08-27 DIAGNOSIS — Z6831 Body mass index (BMI) 31.0-31.9, adult: Secondary | ICD-10-CM | POA: Diagnosis not present

## 2019-08-27 DIAGNOSIS — J9611 Chronic respiratory failure with hypoxia: Secondary | ICD-10-CM | POA: Diagnosis not present

## 2019-08-27 DIAGNOSIS — G8929 Other chronic pain: Secondary | ICD-10-CM | POA: Diagnosis not present

## 2019-08-27 DIAGNOSIS — E119 Type 2 diabetes mellitus without complications: Secondary | ICD-10-CM | POA: Diagnosis not present

## 2019-08-27 DIAGNOSIS — I10 Essential (primary) hypertension: Secondary | ICD-10-CM | POA: Diagnosis not present

## 2019-08-27 DIAGNOSIS — J441 Chronic obstructive pulmonary disease with (acute) exacerbation: Secondary | ICD-10-CM | POA: Diagnosis not present

## 2019-08-27 DIAGNOSIS — R69 Illness, unspecified: Secondary | ICD-10-CM | POA: Diagnosis not present

## 2019-08-27 MED ORDER — ENOXAPARIN SODIUM 40 MG/0.4ML ~~LOC~~ SOLN
40.00 | SUBCUTANEOUS | Status: DC
Start: 2019-08-27 — End: 2019-08-27

## 2019-08-27 MED ORDER — DIVALPROEX SODIUM 250 MG PO DR TAB
250.00 | DELAYED_RELEASE_TABLET | ORAL | Status: DC
Start: 2019-08-27 — End: 2019-08-27

## 2019-08-27 MED ORDER — GABAPENTIN 300 MG PO CAPS
300.00 | ORAL_CAPSULE | ORAL | Status: DC
Start: 2019-08-27 — End: 2019-08-27

## 2019-08-27 MED ORDER — BUPROPION HCL ER (XL) 150 MG PO TB24
150.00 | ORAL_TABLET | ORAL | Status: DC
Start: 2019-08-28 — End: 2019-08-27

## 2019-08-27 MED ORDER — UMECLIDINIUM-VILANTEROL 62.5-25 MCG/INH IN AEPB
1.00 | INHALATION_SPRAY | RESPIRATORY_TRACT | Status: DC
Start: 2019-08-28 — End: 2019-08-27

## 2019-08-27 MED ORDER — GENERIC EXTERNAL MEDICATION
Status: DC
Start: ? — End: 2019-08-27

## 2019-08-27 MED ORDER — VENLAFAXINE HCL ER 75 MG PO CP24
225.00 | ORAL_CAPSULE | ORAL | Status: DC
Start: 2019-08-28 — End: 2019-08-27

## 2019-08-27 MED ORDER — FLUTICASONE PROPIONATE 50 MCG/ACT NA SUSP
2.00 | NASAL | Status: DC
Start: 2019-08-28 — End: 2019-08-27

## 2019-08-27 MED ORDER — GEMFIBROZIL 600 MG PO TABS
600.00 | ORAL_TABLET | ORAL | Status: DC
Start: 2019-08-27 — End: 2019-08-27

## 2019-08-27 MED ORDER — SOLIFENACIN SUCCINATE 5 MG PO TABS
10.00 | ORAL_TABLET | ORAL | Status: DC
Start: 2019-08-28 — End: 2019-08-27

## 2019-08-27 MED ORDER — TRAMADOL HCL 50 MG PO TABS
50.00 | ORAL_TABLET | ORAL | Status: DC
Start: ? — End: 2019-08-27

## 2019-08-27 MED ORDER — PANTOPRAZOLE SODIUM 40 MG PO TBEC
40.00 | DELAYED_RELEASE_TABLET | ORAL | Status: DC
Start: 2019-08-28 — End: 2019-08-27

## 2019-08-27 MED ORDER — LOSARTAN POTASSIUM 100 MG PO TABS
100.00 | ORAL_TABLET | ORAL | Status: DC
Start: 2019-08-28 — End: 2019-08-27

## 2019-08-27 MED ORDER — TIZANIDINE HCL 4 MG PO TABS
4.00 | ORAL_TABLET | ORAL | Status: DC
Start: ? — End: 2019-08-27

## 2019-08-27 MED ORDER — METOPROLOL SUCCINATE ER 50 MG PO TB24
50.00 | ORAL_TABLET | ORAL | Status: DC
Start: 2019-08-28 — End: 2019-08-27

## 2019-08-27 MED ORDER — ACETAMINOPHEN 325 MG PO TABS
650.00 | ORAL_TABLET | ORAL | Status: DC
Start: ? — End: 2019-08-27

## 2019-08-27 MED ORDER — GUAIFENESIN 100 MG/5ML PO SYRP
200.00 | ORAL_SOLUTION | ORAL | Status: DC
Start: ? — End: 2019-08-27

## 2019-08-27 MED ORDER — MELATONIN 3 MG PO TABS
6.00 | ORAL_TABLET | ORAL | Status: DC
Start: ? — End: 2019-08-27

## 2019-08-27 MED ORDER — LORATADINE 10 MG PO TABS
10.00 | ORAL_TABLET | ORAL | Status: DC
Start: 2019-08-28 — End: 2019-08-27

## 2019-08-27 MED ORDER — PREDNISONE 20 MG PO TABS
40.00 | ORAL_TABLET | ORAL | Status: DC
Start: 2019-08-28 — End: 2019-08-27

## 2019-08-27 MED ORDER — CALCIUM CARBONATE ANTACID 500 MG PO CHEW
CHEWABLE_TABLET | ORAL | Status: DC
Start: ? — End: 2019-08-27

## 2019-09-04 ENCOUNTER — Encounter: Payer: Self-pay | Admitting: Obstetrics and Gynecology

## 2019-09-17 DIAGNOSIS — C210 Malignant neoplasm of anus, unspecified: Secondary | ICD-10-CM | POA: Diagnosis not present

## 2019-09-17 DIAGNOSIS — Z85048 Personal history of other malignant neoplasm of rectum, rectosigmoid junction, and anus: Secondary | ICD-10-CM | POA: Diagnosis not present

## 2019-09-17 DIAGNOSIS — R160 Hepatomegaly, not elsewhere classified: Secondary | ICD-10-CM | POA: Diagnosis not present

## 2019-09-17 DIAGNOSIS — K760 Fatty (change of) liver, not elsewhere classified: Secondary | ICD-10-CM | POA: Diagnosis not present

## 2019-09-21 DIAGNOSIS — J988 Other specified respiratory disorders: Secondary | ICD-10-CM | POA: Diagnosis not present

## 2019-09-21 DIAGNOSIS — Z6831 Body mass index (BMI) 31.0-31.9, adult: Secondary | ICD-10-CM | POA: Diagnosis not present

## 2019-09-21 DIAGNOSIS — C210 Malignant neoplasm of anus, unspecified: Secondary | ICD-10-CM | POA: Diagnosis not present

## 2019-09-21 DIAGNOSIS — Z79899 Other long term (current) drug therapy: Secondary | ICD-10-CM | POA: Diagnosis not present

## 2019-09-21 DIAGNOSIS — L409 Psoriasis, unspecified: Secondary | ICD-10-CM | POA: Diagnosis not present

## 2019-09-27 ENCOUNTER — Encounter: Payer: Self-pay | Admitting: Family Medicine

## 2019-10-02 ENCOUNTER — Ambulatory Visit (INDEPENDENT_AMBULATORY_CARE_PROVIDER_SITE_OTHER): Payer: Medicare HMO | Admitting: Family Medicine

## 2019-10-02 ENCOUNTER — Other Ambulatory Visit: Payer: Self-pay

## 2019-10-02 ENCOUNTER — Encounter: Payer: Self-pay | Admitting: Family Medicine

## 2019-10-02 VITALS — BP 118/86 | HR 94 | Temp 96.9°F | Resp 16 | Ht 64.0 in | Wt 184.1 lb

## 2019-10-02 DIAGNOSIS — E213 Hyperparathyroidism, unspecified: Secondary | ICD-10-CM

## 2019-10-02 DIAGNOSIS — R69 Illness, unspecified: Secondary | ICD-10-CM | POA: Diagnosis not present

## 2019-10-02 DIAGNOSIS — F339 Major depressive disorder, recurrent, unspecified: Secondary | ICD-10-CM

## 2019-10-02 NOTE — Progress Notes (Signed)
Name: Judy Allen   MRN: [id number]    DOB: [birth year]   Date:10/02/2019       Progress Note  Subjective  Chief Complaint  Chief Complaint  Patient presents with  . Depression    HPI  MDD: she is now back on 225 mg of Effexor, Wellbutrin and Depakote,  daily however stress is higher, her daughter is pregnant ( no longer dating the father of the baby), Judy Allen's middle daughter attempted suicide the day after . She is really worried about them. She is also upset because she will not be able to be with the baby when he is first born. She does not like the fact that even though she is living with Judy Allen she is still only helping pay the bills for Judy Allen's house. She states she has no energy for the past week, but still gets up and cleans the house . She denies suicidal thoughts or ideation .Discussed referral to psychiatrist and therapist   Hyperparathyroidism: last calcium elevated at [facility], no Pth in over one year and we will recheck today, she has some muscle spasms on her legs, reflux is under control   Patient Active Problem List   Diagnosis Date Noted  . Hypoxia 08/25/2019  . Hyperparathyroidism ([place]) 08/25/2019  . Diabetes mellitus type 2 in obese ([place]) 11/06/2018  . Cervical radiculopathy (left) 08/31/2018  . Osteoarthritis of spine with radiculopathy, cervical region 08/31/2018  . History of anal cancer 05/30/2018  . Myofascial pain syndrome, cervical 05/16/2018  . Chronic pain syndrome 05/16/2018  . Cervical facet joint syndrome 05/16/2018  . Perihepatitis ([place]) 09/27/2017  . Abnormal liver function test 09/27/2017  . Fatty liver 12/22/2016  . Chronic respiratory failure with hypoxia, on home oxygen therapy ([place]) 07/28/2016  . Centrilobular emphysema ([place]) 07/14/2016  . Abnormal Pap smear of cervix 04/14/2016  . Anal squamous cell carcinoma ([place]) 03/09/2016  . Metastatic squamous cell carcinoma ([place]) 02/25/2016  . Non-thrombocytopenic purpura ([place]) 01/12/2016  . Chronic  constipation 10/16/2015  . History of fusion of cervical spine 04/02/2015  . Benign hypertension 01/16/2015  . Cervicalgia 01/16/2015  . CN (constipation) 01/16/2015  . Gastric reflux 01/16/2015  . Hypertriglyceridemia 01/16/2015  . Edema leg 01/16/2015  . Chronic recurrent major depressive disorder ([place]) 01/16/2015  . Dysmetabolic syndrome 18/29/9371  . Obstructive apnea 01/16/2015  . Psoriasis 01/16/2015  . Allergic rhinitis 01/16/2015  . Bursitis, trochanteric 01/16/2015  . GAD (generalized anxiety disorder) 01/16/2015  . Tachycardia, paroxysmal [facility])     Past Surgical History:  Procedure Laterality Date  . ANTERIOR FUSION LUMBAR SPINE  1990   plate in back, not metal  . CARDIAC CATHETERIZATION  2009?    [facility];[place]  . CARDIAC CATHETERIZATION  05/2012   [facility]: Mild hypertension 37/12 with a mean pressure of 21 mm mercury  . CHOLECYSTECTOMY N/A 09/12/2017   Procedure: LAPAROSCOPIC CHOLECYSTECTOMY WITH INTRAOPERATIVE CHOLANGIOGRAM;  Surgeon: Robert Bellow, MD;  Location: [facility] ORS;  Service: General;  Laterality: N/A;  . COLONOSCOPY  2018  . herniated disc repair  2001  . LIVER BIOPSY N/A 09/12/2017   Procedure: LIVER BIOPSY;  Surgeon: Robert Bellow, MD;  Location: [facility] ORS;  Service: General;  Laterality: N/A;  . MASS EXCISION Left 02/23/2016   Procedure: EXCISION MASS;  Surgeon: Christene Lye, MD;  Location: [facility] ORS;  Service: General;  Laterality: Left;  . NECK SURGERY    . PORT-A-CATH REMOVAL  2018  . PORTACATH PLACEMENT Left 03/12/2016   Procedure: INSERTION PORT-A-CATH;  Surgeon: Christene Lye, MD;  Location: [facility] ORS;  Service: General;  Laterality: Left;  . tonsillectomy  1959   history  . TRIGGER FINGER RELEASE Right 09/2018   Dr. Sabra Heck   . TUBAL LIGATION     s/p    Family History  Problem Relation Age of Onset  . Heart attack Mother   . Asthma Mother   . Liver cancer Father   . Breast cancer Maternal Aunt   . Bladder Cancer Neg Hx   .  Kidney cancer Neg Hx     Social History   Tobacco Use  . Smoking status: Former Smoker    Packs/day: 1.00    Years: 32.00    Pack years: 32.00    Types: Cigarettes    Start date: 08/10/1963    Quit date: 01/15/2006    Years since quitting: 13.7  . Smokeless tobacco: Never Used  Substance Use Topics  . Alcohol use: No    Alcohol/week: 0.0 standard drinks  . Drug use: No     Current Outpatient Medications:  .  albuterol (PROVENTIL HFA;VENTOLIN HFA) 108 (90 Base) MCG/ACT inhaler, Inhale 2 puffs into the lungs every 6 (six) hours as needed for wheezing or shortness of breath., Disp: , Rfl:  .  ANORO ELLIPTA 62.5-25 MCG/INH AEPB, INL 1 PUFF PO D, Disp: , Rfl:  .  betamethasone dipropionate (DIPROLENE) 0.05 % cream, Apply 1 application topically 2 (two) times daily as needed. Avoid face , groin and axilla, Disp: 45 g, Rfl: 0 .  blood glucose meter kit and supplies, Dispense based on patient and insurance preference. Use up to four times daily as directed. (FOR ICD-10 E10.9, E11.9)., Disp: 1 each, Rfl: 0 .  buPROPion (WELLBUTRIN XL) 150 MG 24 hr tablet, TAKE 1 TABLET(150 MG) BY MOUTH DAILY, Disp: 90 tablet, Rfl: 1 .  CONTOUR NEXT TEST test strip, USE UP TO FOUR TIMES DAILY AS DIRECTED, Disp: 100 each, Rfl: 5 .  divalproex (DEPAKOTE) 250 MG DR tablet, Take 1 tablet (250 mg total) by mouth at bedtime., Disp: 90 tablet, Rfl: 1 .  fluticasone (FLONASE) 50 MCG/ACT nasal spray, Place 2 sprays into both nostrils daily., Disp: 16 g, Rfl: 2 .  gabapentin (NEURONTIN) 300 MG capsule, TAKE 1 CAPSULE(300 MG) BY MOUTH THREE TIMES DAILY, Disp: 90 capsule, Rfl: 5 .  gemfibrozil (LOPID) 600 MG tablet, Take 600 mg by mouth 2 (two) times daily., Disp: , Rfl:  .  glucose blood test strip, Contour Next Test Strips, Disp: , Rfl:  .  hydrocortisone 2.5 % cream, Apply 1 application topically 2 (two) times daily., Disp: , Rfl:  .  ketoconazole (NIZORAL) 2 % cream, Apply 1 application topically 2 (two) times daily.,  Disp: 60 g, Rfl: 0 .  loratadine (CLARITIN) 10 MG tablet, Take 1 tablet (10 mg total) by mouth daily., Disp: 90 tablet, Rfl: 1 .  losartan (COZAAR) 100 MG tablet, Take 1 tablet (100 mg total) by mouth daily., Disp: 90 tablet, Rfl: 1 .  metFORMIN (GLUCOPHAGE-XR) 500 MG 24 hr tablet, Take 2 tablets (1,000 mg total) by mouth daily., Disp: 180 tablet, Rfl: 1 .  metoprolol succinate (TOPROL-XL) 50 MG 24 hr tablet, Take 1 tablet (50 mg total) by mouth daily. Take with or immediately following a meal., Disp: 90 tablet, Rfl: 1 .  Microlet Lancets MISC, USE FOUR TIMES DAILY, Disp: 100 each, Rfl: 5 .  mirabegron ER (MYRBETRIQ) 50 MG TB24 tablet, Take 1 tablet (50 mg total) by mouth  daily., Disp: 30 tablet, Rfl: 11 .  omega-3 acid ethyl esters (LOVAZA) 1 g capsule, Take 2 capsules (2 g total) by mouth 2 (two) times daily., Disp: 360 capsule, Rfl: 1 .  omeprazole (PRILOSEC) 40 MG capsule, Take 1 capsule (40 mg total) by mouth daily., Disp: 90 capsule, Rfl: 1 .  OXYGEN, Inhale 2 L into the lungs continuous. , Disp: , Rfl:  .  tiZANidine (ZANAFLEX) 4 MG tablet, Take 1 tablet (4 mg total) by mouth every 8 (eight) hours as needed., Disp: 90 tablet, Rfl: 5 .  traMADol (ULTRAM) 50 MG tablet, Take 1 tablet (50 mg total) by mouth every 6 (six) hours as needed for severe pain. Each fill must last 30 days, Disp: 120 tablet, Rfl: 5 .  triamcinolone cream (KENALOG) 0.1 %, Apply topically 2 (two) times daily., Disp: 30 g, Rfl: 0 .  venlafaxine XR (EFFEXOR-XR) 75 MG 24 hr capsule, Take 3 capsules (225 mg total) by mouth daily with breakfast., Disp: 90 capsule, Rfl: 2 .  Vitamin D, Ergocalciferol, (DRISDOL) 1.25 MG (50000 UT) CAPS capsule, Take 1 capsule (50,000 Units total) by mouth every 7 (seven) days., Disp: 12 capsule, Rfl: 1  Allergies  Allergen Reactions  . Dapsone Other (See Comments)    Hypoxemia  . Hydrochlorothiazide     History of hyperparathyroidism   . Sulfa Antibiotics Rash  . Sulfasalazine Rash    I  personally reviewed active problem list, medication list, allergies, family history, social history, health maintenance with the patient/caregiver today.   ROS  Ten systems reviewed and is negative except as mentioned in HPI   Objective  Vitals:   10/02/19 1415  BP: 118/86  Pulse: 94  Resp: 16  Temp: (!) 96.9 F (36.1 C)  TempSrc: Temporal  SpO2: 93%  Weight: 184 lb 1.6 oz (83.5 kg)  Height: 5' 4"  (1.626 m)    Body mass index is 31.6 kg/m.  Physical Exam  Constitutional: Patient appears well-developed and well-nourished. Obese  No distress.  HEENT: head atraumatic, normocephalic, pupils equal and reactive to light Cardiovascular: Normal rate, regular rhythm and normal heart sounds.  No murmur heard. No BLE edema. Pulmonary/Chest: Effort normal and breath sounds normal. No respiratory distress. Abdominal: Soft.  There is no tenderness. Psychiatric: Patient has a normal mood and affect. behavior is normal. Judgment and thought content normal.  PHQ2/9: Depression screen [facility] 2/9 10/02/2019 08/21/2019 07/18/2019 04/20/2019 02/06/2019  Decreased Interest 3 1 1  0 3  Down, Depressed, Hopeless 3 1 1  0 1  PHQ - 2 Score 6 2 2  0 4  Altered sleeping 1 3 1 1  0  Tired, decreased energy 3 3 1 1 2   Change in appetite 1 2 1  0 0  Feeling bad or failure about yourself  3 3 1  0 1  Trouble concentrating 0 0 3 0 0  Moving slowly or fidgety/restless 0 1 1 0 0  Suicidal thoughts 0 0 - 0 0  PHQ-9 Score 14 14 10 2 7   Difficult doing work/chores - Somewhat difficult Somewhat difficult Not difficult at all Not difficult at all  Some recent data might be hidden    phq 9 is positive   Fall Risk: Fall Risk  08/21/2019 07/18/2019 04/20/2019 02/06/2019 09/04/2018  Falls in the past year? 0 0 0 0 0  Number falls in past yr: 0 0 0 0 -  Injury with Fall? 0 0 0 0 -  Follow up - - Falls evaluation completed - -  Assessment & Plan   1. Chronic recurrent major depressive disorder ([place])  She is  under a lot of stress, she does not want to see a psychiatrist  Continue medications for now

## 2019-10-03 ENCOUNTER — Encounter: Payer: Self-pay | Admitting: Family Medicine

## 2019-10-03 LAB — PARATHYROID HORMONE, INTACT (NO CA): PTH: 119 pg/mL — ABNORMAL HIGH (ref 14–64)

## 2019-10-24 DIAGNOSIS — Z9981 Dependence on supplemental oxygen: Secondary | ICD-10-CM | POA: Diagnosis not present

## 2019-10-24 DIAGNOSIS — G4733 Obstructive sleep apnea (adult) (pediatric): Secondary | ICD-10-CM | POA: Diagnosis not present

## 2019-10-24 DIAGNOSIS — J9611 Chronic respiratory failure with hypoxia: Secondary | ICD-10-CM | POA: Diagnosis not present

## 2019-10-24 DIAGNOSIS — Z9989 Dependence on other enabling machines and devices: Secondary | ICD-10-CM | POA: Diagnosis not present

## 2019-10-31 ENCOUNTER — Encounter: Payer: Self-pay | Admitting: Family Medicine

## 2019-11-01 ENCOUNTER — Encounter: Payer: Self-pay | Admitting: Family Medicine

## 2019-11-02 ENCOUNTER — Other Ambulatory Visit: Payer: Self-pay

## 2019-11-02 ENCOUNTER — Ambulatory Visit (INDEPENDENT_AMBULATORY_CARE_PROVIDER_SITE_OTHER): Payer: Medicare HMO | Admitting: Family Medicine

## 2019-11-02 ENCOUNTER — Ambulatory Visit
Admission: RE | Admit: 2019-11-02 | Discharge: 2019-11-02 | Disposition: A | Discharge status: Home / Self Care | Payer: Medicare HMO | Source: Ambulatory Visit | Attending: Family Medicine | Admitting: Family Medicine

## 2019-11-02 ENCOUNTER — Ambulatory Visit
Admission: RE | Admit: 2019-11-02 | Discharge: 2019-11-02 | Disposition: A | Discharge status: Home / Self Care | Payer: Medicare HMO | Attending: Family Medicine | Admitting: Family Medicine

## 2019-11-02 ENCOUNTER — Encounter: Payer: Self-pay | Admitting: Family Medicine

## 2019-11-02 VITALS — BP 122/80 | HR 80 | Temp 97.8°F | Resp 14 | Ht 64.0 in | Wt 187.2 lb

## 2019-11-02 DIAGNOSIS — M545 Low back pain, unspecified: Secondary | ICD-10-CM

## 2019-11-02 DIAGNOSIS — M25562 Pain in left knee: Secondary | ICD-10-CM | POA: Insufficient documentation

## 2019-11-02 DIAGNOSIS — M25561 Pain in right knee: Secondary | ICD-10-CM

## 2019-11-02 DIAGNOSIS — M7989 Other specified soft tissue disorders: Secondary | ICD-10-CM | POA: Diagnosis not present

## 2019-11-02 DIAGNOSIS — M1711 Unilateral primary osteoarthritis, right knee: Secondary | ICD-10-CM | POA: Diagnosis not present

## 2019-11-02 DIAGNOSIS — M1712 Unilateral primary osteoarthritis, left knee: Secondary | ICD-10-CM | POA: Diagnosis not present

## 2019-11-02 MED ORDER — MELOXICAM 7.5 MG PO TABS: 7.5000 mg | ORAL_TABLET | Freq: Every day | ORAL | 0 refills | Status: DC

## 2019-11-02 MED ORDER — PREDNISONE 20 MG PO TABS: ORAL_TABLET | ORAL | 0 refills | Status: DC

## 2019-11-02 NOTE — Progress Notes (Signed)
Patient ID: Judy Allen, female    DOB: 10-01-1955, 64 y.o.   MRN: [id number]  PCP: Steele Sizer, MD  Chief Complaint  Patient presents with  . Leg Pain    bilateral knee down, onset 1 month    Subjective:   Judy Allen is a 64 y.o. female, presents to clinic with CC of the following:  Gradual onset of bilateral knee pain about one month ago, she reports they have swelled up and been red, are sore, and are more painful generally at night, when flared up pain is severe 10/10, hurts to move or stand.  She has gotten some relief with a heating pad.    Knee Pain  Incident onset: onset of bilateral knee pain over one month ago. There was no injury mechanism. The pain is present in the left knee and right knee (right worse than left). The pain is at a severity of 10/10. The pain has been intermittent since onset. Associated symptoms include a loss of motion (pain with flexion and extension). Pertinent negatives include no inability to bear weight, loss of sensation, muscle weakness, numbness or tingling. She reports no foreign bodies present. The symptoms are aggravated by weight bearing, palpation and movement. She has tried nothing for the symptoms.    She can take NSAIDs but hasn't tried Today knees are normal appearing Her legs also sometimes feel like they are going to cramp but she's not having any spasms or cramps.  Denies any paresthesias tingling numbness. She has some right low back pain and sciatic type symptoms that are shooting intermittently as well, this is responded well to heat therapy.  She has a past medical history of Bursitis.       Patient Active Problem List   Diagnosis Date Noted  . Hypoxia 08/25/2019  . Hyperparathyroidism ([place]) 08/25/2019  . Diabetes mellitus type 2 in obese ([place]) 11/06/2018  . Cervical radiculopathy (left) 08/31/2018  . Osteoarthritis of spine with radiculopathy, cervical region 08/31/2018  . History of anal cancer 05/30/2018  .  Myofascial pain syndrome, cervical 05/16/2018  . Chronic pain syndrome 05/16/2018  . Cervical facet joint syndrome 05/16/2018  . Perihepatitis ([place]) 09/27/2017  . Abnormal liver function test 09/27/2017  . Fatty liver 12/22/2016  . Chronic respiratory failure with hypoxia, on home oxygen therapy ([place]) 07/28/2016  . Centrilobular emphysema ([place]) 07/14/2016  . Abnormal Pap smear of cervix 04/14/2016  . Anal squamous cell carcinoma ([place]) 03/09/2016  . Metastatic squamous cell carcinoma ([place]) 02/25/2016  . Non-thrombocytopenic purpura ([place]) 01/12/2016  . Chronic constipation 10/16/2015  . History of fusion of cervical spine 04/02/2015  . Benign hypertension 01/16/2015  . Cervicalgia 01/16/2015  . CN (constipation) 01/16/2015  . Gastric reflux 01/16/2015  . Hypertriglyceridemia 01/16/2015  . Edema leg 01/16/2015  . Chronic recurrent major depressive disorder ([place]) 01/16/2015  . Dysmetabolic syndrome 93/57/0177  . Obstructive apnea 01/16/2015  . Psoriasis 01/16/2015  . Allergic rhinitis 01/16/2015  . Bursitis, trochanteric 01/16/2015  . GAD (generalized anxiety disorder) 01/16/2015  . Tachycardia, paroxysmal ([facility])       Current Outpatient Medications:  .  albuterol (PROVENTIL HFA;VENTOLIN HFA) 108 (90 Base) MCG/ACT inhaler, Inhale 2 puffs into the lungs every 6 (six) hours as needed for wheezing or shortness of breath., Disp: , Rfl:  .  ANORO ELLIPTA 62.5-25 MCG/INH AEPB, INL 1 PUFF PO D, Disp: , Rfl:  .  betamethasone dipropionate (DIPROLENE) 0.05 % cream, Apply 1 application topically 2 (two) times daily as  needed. Avoid face , groin and axilla, Disp: 45 g, Rfl: 0 .  buPROPion (WELLBUTRIN XL) 150 MG 24 hr tablet, TAKE 1 TABLET(150 MG) BY MOUTH DAILY, Disp: 90 tablet, Rfl: 1 .  divalproex (DEPAKOTE) 250 MG DR tablet, Take 1 tablet (250 mg total) by mouth at bedtime., Disp: 90 tablet, Rfl: 1 .  fluticasone (FLONASE) 50 MCG/ACT nasal spray, Place 2 sprays into both nostrils daily.,  Disp: 16 g, Rfl: 2 .  gabapentin (NEURONTIN) 300 MG capsule, TAKE 1 CAPSULE(300 MG) BY MOUTH THREE TIMES DAILY, Disp: 90 capsule, Rfl: 5 .  gemfibrozil (LOPID) 600 MG tablet, Take 600 mg by mouth 2 (two) times daily., Disp: , Rfl:  .  hydrocortisone 2.5 % cream, Apply 1 application topically 2 (two) times daily., Disp: , Rfl:  .  ketoconazole (NIZORAL) 2 % cream, Apply 1 application topically 2 (two) times daily., Disp: 60 g, Rfl: 0 .  loratadine (CLARITIN) 10 MG tablet, Take 1 tablet (10 mg total) by mouth daily., Disp: 90 tablet, Rfl: 1 .  losartan (COZAAR) 100 MG tablet, Take 1 tablet (100 mg total) by mouth daily., Disp: 90 tablet, Rfl: 1 .  metFORMIN (GLUCOPHAGE-XR) 500 MG 24 hr tablet, Take 2 tablets (1,000 mg total) by mouth daily., Disp: 180 tablet, Rfl: 1 .  metoprolol succinate (TOPROL-XL) 50 MG 24 hr tablet, Take 1 tablet (50 mg total) by mouth daily. Take with or immediately following a meal., Disp: 90 tablet, Rfl: 1 .  mirabegron ER (MYRBETRIQ) 50 MG TB24 tablet, Take 1 tablet (50 mg total) by mouth daily., Disp: 30 tablet, Rfl: 11 .  omega-3 acid ethyl esters (LOVAZA) 1 g capsule, Take 2 capsules (2 g total) by mouth 2 (two) times daily., Disp: 360 capsule, Rfl: 1 .  omeprazole (PRILOSEC) 40 MG capsule, Take 1 capsule (40 mg total) by mouth daily., Disp: 90 capsule, Rfl: 1 .  OXYGEN, Inhale 2 L into the lungs continuous. , Disp: , Rfl:  .  tiZANidine (ZANAFLEX) 4 MG tablet, Take 1 tablet (4 mg total) by mouth every 8 (eight) hours as needed., Disp: 90 tablet, Rfl: 5 .  traMADol (ULTRAM) 50 MG tablet, Take 1 tablet (50 mg total) by mouth every 6 (six) hours as needed for severe pain. Each fill must last 30 days, Disp: 120 tablet, Rfl: 5 .  triamcinolone cream (KENALOG) 0.1 %, Apply topically 2 (two) times daily., Disp: 30 g, Rfl: 0 .  venlafaxine XR (EFFEXOR-XR) 75 MG 24 hr capsule, Take 3 capsules (225 mg total) by mouth daily with breakfast., Disp: 90 capsule, Rfl: 2 .  Vitamin D,  Ergocalciferol, (DRISDOL) 1.25 MG (50000 UT) CAPS capsule, Take 1 capsule (50,000 Units total) by mouth every 7 (seven) days., Disp: 12 capsule, Rfl: 1 .  blood glucose meter kit and supplies, Dispense based on patient and insurance preference. Use up to four times daily as directed. (FOR ICD-10 E10.9, E11.9)., Disp: 1 each, Rfl: 0 .  CONTOUR NEXT TEST test strip, USE UP TO FOUR TIMES DAILY AS DIRECTED, Disp: 100 each, Rfl: 5 .  glucose blood test strip, Contour Next Test Strips, Disp: , Rfl:  .  Microlet Lancets MISC, USE FOUR TIMES DAILY, Disp: 100 each, Rfl: 5   Allergies  Allergen Reactions  . Dapsone Other (See Comments)    Hypoxemia  . Hydrochlorothiazide     History of hyperparathyroidism   . Sulfa Antibiotics Rash  . Sulfasalazine Rash     Family History  Problem Relation Age  of Onset  . Heart attack Mother   . Asthma Mother   . Liver cancer Father   . Breast cancer Maternal Aunt   . Bladder Cancer Neg Hx   . Kidney cancer Neg Hx      Social History   Socioeconomic History  . Marital status: Widowed    Spouse name: Automotive engineer  . Number of children: 3  . Years of education: Not on file  . Highest education level: 11th grade  Occupational History  . Not on file  Tobacco Use  . Smoking status: Former Smoker    Packs/day: 1.00    Years: 32.00    Pack years: 32.00    Types: Cigarettes    Start date: 08/10/1963    Quit date: 01/15/2006    Years since quitting: 13.8  . Smokeless tobacco: Never Used  Substance and Sexual Activity  . Alcohol use: No    Alcohol/week: 0.0 standard drinks  . Drug use: No  . Sexual activity: Never    Birth control/protection: Post-menopausal  Other Topics Concern  . Not on file  Social History Narrative   Husband passed away on 12/29/17   She was living with [name] for few years, but moved in with Candace her oldest daughter this past Summer.    Social Determinants of Health   Financial Resource Strain:   . Difficulty of  Paying Living Expenses:   Food Insecurity:   . Worried About Charity fundraiser in the Last Year:   . Arboriculturist in the Last Year:   Transportation Needs:   . Film/video editor (Medical):   Marland Kitchen Lack of Transportation (Non-Medical):   Physical Activity:   . Days of Exercise per Week:   . Minutes of Exercise per Session:   Stress: No Stress Concern Present  . Feeling of Stress : Not at all  Social Connections: Unknown  . Frequency of Communication with Friends and Family: Not on file  . Frequency of Social Gatherings with Friends and Family: More than three times a week  . Attends Religious Services: Not on file  . Active Member of Clubs or Organizations: Not on file  . Attends Archivist Meetings: Not on file  . Marital Status: Not on file  Intimate Partner Violence:   . Fear of Current or Ex-Partner:   . Emotionally Abused:   Marland Kitchen Physically Abused:   . Sexually Abused:     Chart Review Today: I personally reviewed active problem list, medication list, allergies, family history, social history, health maintenance, notes from last encounter, lab results, imaging with the patient/caregiver today.   Review of Systems  Neurological: Negative for tingling and numbness.       Objective:   Vitals:   11/02/19 1423  BP: 122/80  Pulse: 80  Resp: 14  Temp: 97.8 F (36.6 C)  SpO2: 91%  Weight: 187 lb 3.2 oz (84.9 kg)  Height: _0  (1.626 m)    Body mass index is 32.13 kg/m.  Physical Exam Vitals and nursing note reviewed.  Constitutional:      General: She is not in acute distress.    Appearance: She is well-developed. She is ill-appearing (chronically ill). She is not toxic-appearing or diaphoretic.     Interventions: Nasal cannula and face mask in place.     Comments: On her portable oxygen  HENT:     Head: Normocephalic and atraumatic.     Nose: Nose normal.  Eyes:  General:        Right eye: No discharge.        Left eye: No discharge.      Conjunctiva/sclera: Conjunctivae normal.  Neck:     Trachea: No tracheal deviation.  Cardiovascular:     Rate and Rhythm: Normal rate and regular rhythm.  Pulmonary:     Effort: Pulmonary effort is normal. No respiratory distress.     Breath sounds: No stridor.  Musculoskeletal:        General: Normal range of motion.     Right knee: Normal. No swelling, deformity, effusion, erythema, bony tenderness or crepitus. Normal range of motion. No tenderness. Normal alignment, normal meniscus and normal patellar mobility. Normal pulse.     Instability Tests: Negative medial McMurray test and negative lateral McMurray test.     Left knee: Normal. No swelling, deformity, effusion, erythema, bony tenderness or crepitus. Normal range of motion. No tenderness. Normal alignment, normal meniscus and normal patellar mobility. Normal pulse.     Instability Tests: Negative medial McMurray test and negative lateral McMurray test.     Comments: No midline tenderness from cervical to lumbar spine, no paraspinal muscle tenderness from cervical to lumbar spine, some mild tenderness to the lower lumbosacral region and SI joint area particularly more tenderness to palpation to the right SI joint area with negative straight leg raise bilaterally Grossly normal sensation to light touch to bilateral lower extremities  Good range of motion of back 5/5 strength bilateral lower extremities with plantar flexion and dorsiflexion  Bilateral knees normal-appearing, bilateral knees with negative anterior drawer test   Skin:    General: Skin is warm and dry.     Findings: No rash.  Neurological:     Mental Status: She is alert.     Motor: No abnormal muscle tone.     Coordination: Coordination normal.  Psychiatric:        Behavior: Behavior normal.      Results for orders placed or performed in visit on 10/02/19  Parathyroid hormone, intact (no Ca)  Result Value Ref Range   PTH 119 (H) 14 - 64 pg/mL          Assessment & Plan:   Bilateral knee pain gradual onset 1 month ago she reports both have been erythematous and edematous, normal-appearing today.  She states that her more at night when swollen and red hurt with any type of activity movement palpation or weightbearing.  She has no history of gout or other inflammatory arthritis.  She has had no past injury.  Her exam today on both knees is unremarkable no effusion, crepitus, deficit in range of motion, negative meniscal testing.  Suspect most likely cause of her intermittent pain and swelling would be osteoarthritis flares?  Could possibly be other inflammatory arthritis or pseudogout?  Patient did want to proceed with x-rays today-do think this could help really only assess joint spaces see if there is any severe osteoarthritis or bone-on-bone, could otherwise see some bone spurs or decreased joint space.  I discussed with the patient explained that there is no can be a change to our treatment today based on x-rays and she could defer this to a specialist if she would like to see an orthopedic but she did want to get x-rays done today.  We will proceed with treatment with short burst of steroids for right low back pain seems somewhat like sacroiliitis versus sciatica however the straight leg raise test was negative on exam she  is endorsing radicular symptoms particularly on the right side and she was mildly tender to bilateral low lumbar sacral area and SI joint.  We will try a small burst of steroids, encouraged her to try physical therapy but she declined.  She can use Mobic after the steroids are done for back pain and for knee pain or swelling.  Patient was encouraged to do frequent position changes, heat therapy, gentle stretching for her low back.  A handout was given with some information.  Encouraged her to follow-up with [place] if she would like physical therapy referral or orthopedic referral.  1. Right knee pain, unspecified chronicity  - meloxicam  (MOBIC) 7.5 MG tablet; Take 1 tablet (7.5 mg total) by mouth daily.  Dispense: 30 tablet; Refill: 0 - DG Knee Complete 4 Views Right; Future  2. Left knee pain, unspecified chronicity  - meloxicam (MOBIC) 7.5 MG tablet; Take 1 tablet (7.5 mg total) by mouth daily.  Dispense: 30 tablet; Refill: 0 - DG Knee Complete 4 Views Left; Future  3. Right low back pain, unspecified chronicity, unspecified whether sciatica present  - meloxicam (MOBIC) 7.5 MG tablet; Take 1 tablet (7.5 mg total) by mouth daily.  Dispense: 30 tablet; Refill: 0 - predniSONE (DELTASONE) 20 MG tablet; 2 tabs poqday 1-3, 1 tabs poqday 4-6  Dispense: 9 tablet; Refill: 0       Delsa Grana, PA-C 11/02/19 2:45 PM

## 2019-11-02 NOTE — Patient Instructions (Addendum)
Take prednisone, and afterwards you can take mobic for joint pain (knees and hip/sciatic)  Follow up here if you would like physical therapy referral, joint injection, or if worsening f/up   Osteoarthritis  Osteoarthritis is a type of arthritis that affects tissue that covers the ends of bones in joints (cartilage). Cartilage acts as a cushion between the bones and helps them move smoothly. Osteoarthritis results when cartilage in the joints gets worn down. Osteoarthritis is sometimes called "wear and tear" arthritis. Osteoarthritis is the most common form of arthritis. It often occurs in older people. It is a condition that gets worse over time (a progressive condition). Joints that are most often affected by this condition are in:  Fingers.  Toes.  Hips.  Knees.  Spine, including neck and lower back. What are the causes? This condition is caused by age-related wearing down of cartilage that covers the ends of bones. What increases the risk? The following factors may make you more likely to develop this condition:  Older age.  Being overweight or obese.  Overuse of joints, such as in athletes.  Past injury of a joint.  Past surgery on a joint.  Family history of osteoarthritis. What are the signs or symptoms? The main symptoms of this condition are pain, swelling, and stiffness in the joint. The joint may lose its shape over time. Small pieces of bone or cartilage may break off and float inside of the joint, which may cause more pain and damage to the joint. Small deposits of bone (osteophytes) may grow on the edges of the joint. Other symptoms may include:  A grating or scraping feeling inside the joint when you move it.  Popping or creaking sounds when you move. Symptoms may affect one or more joints. Osteoarthritis in a major joint, such as your knee or hip, can make it painful to walk or exercise. If you have osteoarthritis in your hands, you might not be able to grip  items, twist your hand, or control small movements of your hands and fingers (fine motor skills). How is this diagnosed? This condition may be diagnosed based on:  Your medical history.  A physical exam.  Your symptoms.  X-rays of the affected joint(s).  Blood tests to rule out other types of arthritis. How is this treated? There is no cure for this condition, but treatment can help to control pain and improve joint function. Treatment plans may include:  A prescribed exercise program that allows for rest and joint relief. You may work with a physical therapist.  A weight control plan.  Pain relief techniques, such as: ? Applying heat and cold to the joint. ? Electric pulses delivered to nerve endings under the skin (transcutaneous electrical nerve stimulation, or TENS). ? Massage. ? Certain nutritional supplements.  NSAIDs or prescription medicines to help relieve pain.  Medicine to help relieve pain and inflammation (corticosteroids). This can be given by mouth (orally) or as an injection.  Assistive devices, such as a brace, wrap, splint, specialized glove, or cane.  Surgery, such as: ? An osteotomy. This is done to reposition the bones and relieve pain or to remove loose pieces of bone and cartilage. ? Joint replacement surgery. You may need this surgery if you have very bad (advanced) osteoarthritis. Follow these instructions at home: Activity  Rest your affected joints as directed by your health care provider.  Do not drive or use heavy machinery while taking prescription pain medicine.  Exercise as directed. Your health care provider  or physical therapist may recommend specific types of exercise, such as: ? Strengthening exercises. These are done to strengthen the muscles that support joints that are affected by arthritis. They can be performed with weights or with exercise bands to add resistance. ? Aerobic activities. These are exercises, such as brisk walking or  water aerobics, that get your heart pumping. ? Range-of-motion activities. These keep your joints easy to move. ? Balance and agility exercises. Managing pain, stiffness, and swelling      If directed, apply heat to the affected area as often as told by your health care provider. Use the heat source that your health care provider recommends, such as a moist heat pack or a heating pad. ? If you have a removable assistive device, remove it as told by your health care provider. ? Place a towel between your skin and the heat source. If your health care provider tells you to keep the assistive device on while you apply heat, place a towel between the assistive device and the heat source. ? Leave the heat on for 20-30 minutes. ? Remove the heat if your skin turns bright red. This is especially important if you are unable to feel pain, heat, or cold. You may have a greater risk of getting burned.  If directed, put ice on the affected joint: ? If you have a removable assistive device, remove it as told by your health care provider. ? Put ice in a plastic bag. ? Place a towel between your skin and the bag. If your health care provider tells you to keep the assistive device on during icing, place a towel between the assistive device and the bag. ? Leave the ice on for 20 minutes, 2-3 times a day. General instructions  Take over-the-counter and prescription medicines only as told by your health care provider.  Maintain a healthy weight. Follow instructions from your health care provider for weight control. These may include dietary restrictions.  Do not use any products that contain nicotine or tobacco, such as cigarettes and e-cigarettes. These can delay bone healing. If you need help quitting, ask your health care provider.  Use assistive devices as directed by your health care provider.  Keep all follow-up visits as told by your health care provider. This is important. Where to find more  information  [employer] of Arthritis and Musculoskeletal and Skin Diseases: www.niams.SouthExposed.es  [employer] on Aging: http://kim-miller.com/  American College of Rheumatology: www.rheumatology.org Contact a health care provider if:  Your skin turns red.  You develop a rash.  You have pain that gets worse.  You have a fever along with joint or muscle aches. Get help right away if:  You lose a lot of weight.  You suddenly lose your appetite.  You have night sweats. Summary  Osteoarthritis is a type of arthritis that affects tissue covering the ends of bones in joints (cartilage).  This condition is caused by age-related wearing down of cartilage that covers the ends of bones.  The main symptom of this condition is pain, swelling, and stiffness in the joint.  There is no cure for this condition, but treatment can help to control pain and improve joint function. This information is not intended to replace advice given to you by your health care provider. Make sure you discuss any questions you have with your health care provider. Document Revised: 07/08/2017 Document Reviewed: 03/29/2016 Elsevier Patient Education  2020 [place].    Sciatica  Sciatica is pain, weakness,  tingling, or loss of feeling (numbness) along the sciatic nerve. The sciatic nerve starts in the lower back and goes down the back of each leg. Sciatica usually goes away on its own or with treatment. Sometimes, sciatica may come back (recur). What are the causes? This condition happens when the sciatic nerve is pinched or has pressure put on it. This may be the result of:  A disk in between the bones of the spine bulging out too far (herniated disk).  Changes in the spinal disks that occur with aging.  A condition that affects a muscle in the butt.  Extra bone growth near the sciatic nerve.  A break (fracture) of the area between your hip bones (pelvis).  Pregnancy.  Tumor. This is  rare. What increases the risk? You are more likely to develop this condition if you:  Play sports that put pressure or stress on the spine.  Have poor strength and ease of movement (flexibility).  Have had a back injury in the past.  Have had back surgery.  Sit for long periods of time.  Do activities that involve bending or lifting over and over again.  Are very overweight (obese). What are the signs or symptoms? Symptoms can vary from mild to very bad. They may include:  Any of these problems in the lower back, leg, hip, or butt: ? Mild tingling, loss of feeling, or dull aches. ? Burning sensations. ? Sharp pains.  Loss of feeling in the back of the calf or the sole of the foot.  Leg weakness.  Very bad back pain that makes it hard to move. These symptoms may get worse when you cough, sneeze, or laugh. They may also get worse when you sit or stand for long periods of time. How is this treated? This condition often gets better without any treatment. However, treatment may include:  Changing or cutting back on physical activity when you have pain.  Doing exercises and stretching.  Putting ice or heat on the affected area.  Medicines that help: ? To relieve pain and swelling. ? To relax your muscles.  Shots (injections) of medicines that help to relieve pain, irritation, and swelling.  Surgery. Follow these instructions at home: Medicines  Take over-the-counter and prescription medicines only as told by your doctor.  Ask your doctor if the medicine prescribed to you: ? Requires you to avoid driving or using heavy machinery. ? Can cause trouble pooping (constipation). You may need to take these steps to prevent or treat trouble pooping:  Drink enough fluids to keep your pee (urine) pale yellow.  Take over-the-counter or prescription medicines.  Eat foods that are high in fiber. These include beans, whole grains, and fresh fruits and vegetables.  Limit foods  that are high in fat and sugar. These include fried or sweet foods. Managing pain      If told, put ice on the affected area. ? Put ice in a plastic bag. ? Place a towel between your skin and the bag. ? Leave the ice on for 20 minutes, 2-3 times a day.  If told, put heat on the affected area. Use the heat source that your doctor tells you to use, such as a moist heat pack or a heating pad. ? Place a towel between your skin and the heat source. ? Leave the heat on for 20-30 minutes. ? Remove the heat if your skin turns bright red. This is very important if you are unable to feel pain, heat,  or cold. You may have a greater risk of getting burned. Activity   Return to your normal activities as told by your doctor. Ask your doctor what activities are safe for you.  Avoid activities that make your symptoms worse.  Take short rests during the day. ? When you rest for a long time, do some physical activity or stretching between periods of rest. ? Avoid sitting for a long time without moving. Get up and move around at least one time each hour.  Exercise and stretch regularly, as told by your doctor.  Do not lift anything that is heavier than 10 lb (4.5 kg) while you have symptoms of sciatica. ? Avoid lifting heavy things even when you do not have symptoms. ? Avoid lifting heavy things over and over.  When you lift objects, always lift in a way that is safe for your body. To do this, you should: ? Bend your knees. ? Keep the object close to your body. ? Avoid twisting. General instructions  Stay at a healthy weight.  Wear comfortable shoes that support your feet. Avoid wearing high heels.  Avoid sleeping on a mattress that is too soft or too hard. You might have less pain if you sleep on a mattress that is firm enough to support your back.  Keep all follow-up visits as told by your doctor. This is important. Contact a doctor if:  You have pain that: ? Wakes you up when you are  sleeping. ? Gets worse when you lie down. ? Is worse than the pain you have had in the past. ? Lasts longer than 4 weeks.  You lose weight without trying. Get help right away if:  You cannot control when you pee (urinate) or poop (have a bowel movement).  You have weakness in any of these areas and it gets worse: ? Lower back. ? The area between your hip bones. ? Butt. ? Legs.  You have redness or swelling of your back.  You have a burning feeling when you pee. Summary  Sciatica is pain, weakness, tingling, or loss of feeling (numbness) along the sciatic nerve.  This condition happens when the sciatic nerve is pinched or has pressure put on it.  Sciatica can cause pain, tingling, or loss of feeling (numbness) in the lower back, legs, hips, and butt.  Treatment often includes rest, exercise, medicines, and putting ice or heat on the affected area. This information is not intended to replace advice given to you by your health care provider. Make sure you discuss any questions you have with your health care provider. Document Revised: 08/14/2018 Document Reviewed: 08/14/2018 Elsevier Patient Education  [place].   Sciatica Rehab Ask your health care provider which exercises are safe for you. Do exercises exactly as told by your health care provider and adjust them as directed. It is normal to feel mild stretching, pulling, tightness, or discomfort as you do these exercises. Stop right away if you feel sudden pain or your pain gets worse. Do not begin these exercises until told by your health care provider. Stretching and range-of-motion exercises These exercises warm up your muscles and joints and improve the movement and flexibility of your hips and back. These exercises also help to relieve pain, numbness, and tingling. Sciatic nerve glide 1. Sit in a chair with your head facing down toward your chest. Place your hands behind your back. Let your shoulders slump forward.  2. Slowly straighten one of your legs while you tilt  your head back as if you are looking toward the ceiling. Only straighten your leg as far as you can without making your symptoms worse. 3. Hold this position for __________ seconds. 4. Slowly return to the starting position. 5. Repeat with your other leg. Repeat __________ times. Complete this exercise __________ times a day. Knee to chest with hip adduction and internal rotation  1. Lie on your back on a firm surface with both legs straight. 2. Bend one of your knees and move it up toward your chest until you feel a gentle stretch in your lower back and buttock. Then, move your knee toward the shoulder that is on the opposite side from your leg. This is hip adduction and internal rotation. ? Hold your leg in this position by holding on to the front of your knee. 3. Hold this position for __________ seconds. 4. Slowly return to the starting position. 5. Repeat with your other leg. Repeat __________ times. Complete this exercise __________ times a day. Prone extension on elbows  1. Lie on your abdomen on a firm surface. A bed may be too soft for this exercise. 2. Prop yourself up on your elbows. 3. Use your arms to help lift your chest up until you feel a gentle stretch in your abdomen and your lower back. ? This will place some of your body weight on your elbows. If this is uncomfortable, try stacking pillows under your chest. ? Your hips should stay down, against the surface that you are lying on. Keep your hip and back muscles relaxed. 4. Hold this position for __________ seconds. 5. Slowly relax your upper body and return to the starting position. Repeat __________ times. Complete this exercise __________ times a day. Strengthening exercises These exercises build strength and endurance in your back. Endurance is the ability to use your muscles for a long time, even after they get tired. Pelvic tilt This exercise strengthens the  muscles that lie deep in the abdomen. 1. Lie on your back on a firm surface. Bend your knees and keep your feet flat on the floor. 2. Tense your abdominal muscles. Tip your pelvis up toward the ceiling and flatten your lower back into the floor. ? To help with this exercise, you may place a small towel under your lower back and try to push your back into the towel. 3. Hold this position for __________ seconds. 4. Let your muscles relax completely before you repeat this exercise. Repeat __________ times. Complete this exercise __________ times a day. Alternating arm and leg raises  1. Get on your hands and knees on a firm surface. If you are on a hard floor, you may want to use padding, such as an exercise mat, to cushion your knees. 2. Line up your arms and legs. Your hands should be directly below your shoulders, and your knees should be directly below your hips. 3. Lift your left leg behind you. At the same time, raise your right arm and straighten it in front of you. ? Do not lift your leg higher than your hip. ? Do not lift your arm higher than your shoulder. ? Keep your abdominal and back muscles tight. ? Keep your hips facing the ground. ? Do not arch your back. ? Keep your balance carefully, and do not hold your breath. 4. Hold this position for __________ seconds. 5. Slowly return to the starting position. 6. Repeat with your right leg and your left arm. Repeat __________ times. Complete this exercise __________  times a day. Posture and body mechanics Good posture and healthy body mechanics can help to relieve stress in your body's tissues and joints. Body mechanics refers to the movements and positions of your body while you do your daily activities. Posture is part of body mechanics. Good posture means:  Your spine is in its natural S-curve position (neutral).  Your shoulders are pulled back slightly.  Your head is not tipped forward. Follow these guidelines to improve your  posture and body mechanics in your everyday activities. Standing   When standing, keep your spine neutral and your feet about hip width apart. Keep a slight bend in your knees. Your ears, shoulders, and hips should line up.  When you do a task in which you stand in one place for a long time, place one foot up on a stable object that is 2-4 inches (5-10 cm) high, such as a footstool. This helps keep your spine neutral. Sitting   When sitting, keep your spine neutral and keep your feet flat on the floor. Use a footrest, if necessary, and keep your thighs parallel to the floor. Avoid rounding your shoulders, and avoid tilting your head forward.  When working at a desk or a computer, keep your desk at a height where your hands are slightly lower than your elbows. Slide your chair under your desk so you are close enough to maintain good posture.  When working at a computer, place your monitor at a height where you are looking straight ahead and you do not have to tilt your head forward or downward to look at the screen. Resting  When lying down and resting, avoid positions that are most painful for you.  If you have pain with activities such as sitting, bending, stooping, or squatting, lie in a position in which your body does not bend very much. For example, avoid curling up on your side with your arms and knees near your chest (fetal position).  If you have pain with activities such as standing for a long time or reaching with your arms, lie with your spine in a neutral position and bend your knees slightly. Try the following positions: ? Lying on your side with a pillow between your knees. ? Lying on your back with a pillow under your knees. Lifting   When lifting objects, keep your feet at least shoulder width apart and tighten your abdominal muscles.  Bend your knees and hips and keep your spine neutral. It is important to lift using the strength of your legs, not your back. Do not lock  your knees straight out.  Always ask for help to lift heavy or awkward objects. This information is not intended to replace advice given to you by your health care provider. Make sure you discuss any questions you have with your health care provider. Document Revised: 11/17/2018 Document Reviewed: 08/17/2018 Elsevier Patient Education  [place].

## 2019-11-06 ENCOUNTER — Telehealth: Payer: Self-pay | Admitting: Family Medicine

## 2019-11-06 ENCOUNTER — Ambulatory Visit: Payer: Medicare HMO | Admitting: Family Medicine

## 2019-11-06 NOTE — Telephone Encounter (Signed)
Bridgette, from [employer], called stating that she has has not been able to reach pt. She is concerned because she can only see that the pt last filled medications in January. Bridgette states that the pt may not be taking medications and is requesting to have someone reach out to pt for appt. Please advise.    [phone]

## 2019-11-07 NOTE — Telephone Encounter (Signed)
Called Mrs. Child psychotherapist and gave her Bridgette from [employer] [facility] name and phone number to reach back out to her. So she could ensure Mrs. Bridgette she was alright.

## 2019-11-13 ENCOUNTER — Encounter: Payer: Self-pay | Admitting: Family Medicine

## 2019-11-13 ENCOUNTER — Encounter: Payer: Self-pay | Admitting: Student in an Organized Health Care Education/Training Program

## 2019-11-13 ENCOUNTER — Ambulatory Visit (HOSPITAL_BASED_OUTPATIENT_CLINIC_OR_DEPARTMENT_OTHER): Payer: Medicare HMO | Admitting: Student in an Organized Health Care Education/Training Program

## 2019-11-13 ENCOUNTER — Other Ambulatory Visit: Payer: Self-pay

## 2019-11-13 ENCOUNTER — Ambulatory Visit
Admission: RE | Admit: 2019-11-13 | Discharge: 2019-11-13 | Disposition: A | Discharge status: Home / Self Care | Payer: Medicare HMO | Source: Ambulatory Visit | Attending: Student in an Organized Health Care Education/Training Program | Admitting: Student in an Organized Health Care Education/Training Program

## 2019-11-13 VITALS — BP 118/81 | HR 90 | Temp 96.7°F | Resp 18 | Ht 64.0 in | Wt 188.0 lb

## 2019-11-13 DIAGNOSIS — G8929 Other chronic pain: Secondary | ICD-10-CM | POA: Diagnosis not present

## 2019-11-13 DIAGNOSIS — M7918 Myalgia, other site: Secondary | ICD-10-CM | POA: Insufficient documentation

## 2019-11-13 DIAGNOSIS — M542 Cervicalgia: Secondary | ICD-10-CM

## 2019-11-13 DIAGNOSIS — M25551 Pain in right hip: Secondary | ICD-10-CM

## 2019-11-13 DIAGNOSIS — M5136 Other intervertebral disc degeneration, lumbar region: Secondary | ICD-10-CM | POA: Diagnosis not present

## 2019-11-13 DIAGNOSIS — Z882 Allergy status to sulfonamides status: Secondary | ICD-10-CM | POA: Insufficient documentation

## 2019-11-13 DIAGNOSIS — M5412 Radiculopathy, cervical region: Secondary | ICD-10-CM | POA: Insufficient documentation

## 2019-11-13 DIAGNOSIS — Z87891 Personal history of nicotine dependence: Secondary | ICD-10-CM | POA: Insufficient documentation

## 2019-11-13 DIAGNOSIS — Z7984 Long term (current) use of oral hypoglycemic drugs: Secondary | ICD-10-CM | POA: Diagnosis not present

## 2019-11-13 DIAGNOSIS — Z791 Long term (current) use of non-steroidal anti-inflammatories (NSAID): Secondary | ICD-10-CM | POA: Insufficient documentation

## 2019-11-13 DIAGNOSIS — M25552 Pain in left hip: Secondary | ICD-10-CM

## 2019-11-13 DIAGNOSIS — Z79891 Long term (current) use of opiate analgesic: Secondary | ICD-10-CM | POA: Diagnosis not present

## 2019-11-13 DIAGNOSIS — Z79899 Other long term (current) drug therapy: Secondary | ICD-10-CM | POA: Insufficient documentation

## 2019-11-13 DIAGNOSIS — I10 Essential (primary) hypertension: Secondary | ICD-10-CM | POA: Diagnosis not present

## 2019-11-13 DIAGNOSIS — G894 Chronic pain syndrome: Secondary | ICD-10-CM | POA: Insufficient documentation

## 2019-11-13 DIAGNOSIS — E119 Type 2 diabetes mellitus without complications: Secondary | ICD-10-CM | POA: Diagnosis not present

## 2019-11-13 DIAGNOSIS — M533 Sacrococcygeal disorders, not elsewhere classified: Secondary | ICD-10-CM | POA: Insufficient documentation

## 2019-11-13 MED ORDER — TRAMADOL HCL 50 MG PO TABS: 50.0000 mg | ORAL_TABLET | Freq: Four times a day (QID) | ORAL | 5 refills | Status: DC | PRN

## 2019-11-13 MED ORDER — GABAPENTIN 300 MG PO CAPS: ORAL_CAPSULE | ORAL | 5 refills | Status: DC

## 2019-11-13 MED ORDER — TIZANIDINE HCL 4 MG PO TABS: 4.0000 mg | ORAL_TABLET | Freq: Three times a day (TID) | ORAL | 5 refills | Status: DC | PRN

## 2019-11-13 NOTE — Progress Notes (Signed)
PROVIDER NOTE: Information contained herein reflects review and annotations entered in association with encounter. Interpretation of such information and data should be left to medically-trained personnel. Information provided to patient can be located elsewhere in the medical record under "Patient Instructions". Document created using STT-dictation technology, any transcriptional errors that may result from process are unintentional.    Patient: Judy Allen  Service Category: E/M  Provider: Gillis Santa, MD  DOB: 1955-12-14  DOS: 11/13/2019  Referring Provider: Steele Sizer, MD  MRN: [id number]  Setting: Ambulatory outpatient  PCP: Steele Sizer, MD  Type: Established Patient  Specialty: Interventional Pain Management    Location: Office  Delivery: Face-to-face     Primary Reason(s) for Visit: Encounter for prescription drug management. (Level of risk: moderate)  CC: Neck Pain  HPI  Judy Allen is a 64 y.o. year old, female patient, who comes today for a medication management evaluation. She has Tachycardia, paroxysmal ([place]); Benign hypertension; Cervicalgia; CN (constipation); Gastric reflux; Hypertriglyceridemia; Edema leg; Chronic recurrent major depressive disorder ([place]); Dysmetabolic syndrome; Obstructive apnea; Psoriasis; Allergic rhinitis; Bursitis, trochanteric; GAD (generalized anxiety disorder); History of fusion of cervical spine; Chronic constipation; Non-thrombocytopenic purpura ([place]); Metastatic squamous cell carcinoma ([place]); Anal squamous cell carcinoma ([place]); Abnormal Pap smear of cervix; Centrilobular emphysema ([place]); Chronic respiratory failure with hypoxia, on home oxygen therapy ([place]); Fatty liver; Perihepatitis ([place]); Abnormal liver function test; Myofascial pain syndrome, cervical; Chronic pain syndrome; Cervical facet joint syndrome; History of anal cancer; Cervical radiculopathy (left); Osteoarthritis of spine with radiculopathy, cervical region; Diabetes mellitus type 2 in  obese ([place]); Hypoxia; and Hyperparathyroidism ([place]) on their problem list. Her primarily concern today is the Neck Pain  Pain Assessment: Location:   Neck Radiating: left arm Onset: More than a month ago Duration: Chronic pain Quality: Tingling, Constant Severity: 2 /10 (subjective, self-reported pain score)  Note: Reported level is compatible with observation.                         When using our objective Pain Scale, levels between 6 and 10/10 are said to belong in an emergency room, as it progressively worsens from a 6/10, described as severely limiting, requiring emergency care not usually available at an outpatient pain management facility. At a 6/10 level, communication becomes difficult and requires great effort. Assistance to reach the emergency department may be required. Facial flushing and profuse sweating along with potentially dangerous increases in heart rate and blood pressure will be evident. Effect on ADL:   Timing: Constant Modifying factors: "nothing" BP: 118/81  HR: 90  Judy Allen was last scheduled for an appointment on 05/22/2019 for medication management. During today's appointment we reviewed Judy Allen's chronic pain status, as well as her outpatient medication regimen.  Overall, patient has been doing well.  Her last visit with me was on May 22, 2019.  No significant changes in her medical history since then.  She still has neck pain, cervicalgia which is managed on her tramadol, gabapentin and tizanidine regimen.  She is endorsing hip pain that is worse with weightbearing, right greater than left.  She also has occasional buttock pain as well.  The patient  reports no history of drug use. Her body mass index is 32.27 kg/m.  Further details on both, my assessment(s), as well as the proposed treatment plan, please see below.  Controlled Substance Pharmacotherapy Assessment REMS (Risk Evaluation and Mitigation Strategy)  Analgesic: 10/23/2019  3   05/22/2019   Tramadol Hcl 50  MG Tablet  120.00  30 Bi Lat   [id number]   Wal ([id number])   4  20.00 MME  Comm Ins   [place]     Hart Rochester, RN  11/13/2019 10:29 AM  Sign when Signing Visit Nursing Pain Medication Assessment:  Safety precautions to be maintained throughout the outpatient stay will include: orient to surroundings, keep bed in low position, maintain call bell within reach at all times, provide assistance with transfer out of bed and ambulation.  Medication Inspection Compliance: Pill count conducted under aseptic conditions, in front of the patient. Neither the pills nor the bottle was removed from the patient's sight at any time. Once count was completed pills were immediately returned to the patient in their original bottle.  Medication: Tramadol (Ultram) Pill/Patch Count: 39 of 120 pills remain Pill/Patch Appearance: Markings consistent with prescribed medication Bottle Appearance: Standard pharmacy container. Clearly labeled. Filled Date: 03 / 15 / 21 Last Medication intake:  Today  Hart Rochester, RN  11/13/2019 10:22 AM  Sign when Signing Visit Safety precautions to be maintained throughout the outpatient stay will include: orient to surroundings, keep bed in low position, maintain call bell within reach at all times, provide assistance with transfer out of bed and ambulation.    Pharmacokinetics: Liberation and absorption (onset of action): WNL Distribution (time to peak effect): WNL Metabolism and excretion (duration of action): WNL         Pharmacodynamics: Desired effects: Analgesia: Judy Allen reports >50% benefit. Functional ability: Patient reports that medication allows her to accomplish basic ADLs Clinically meaningful improvement in function (CMIF): Sustained CMIF goals met Perceived effectiveness: Described as relatively effective, allowing for increase in activities of daily living (ADL) Undesirable effects: Side-effects or Adverse reactions: None  reported Monitoring:  PMP: PDMP not reviewed this encounter. Online review of the past 53-monthperiod conducted. Compliant with practice rules and regulations Last UDS on record: Summary  Date Value Ref Range Status  08/31/2018 FINAL  Final    Comment:    ==================================================================== TOXASSURE SELECT 13 (MW) ==================================================================== Test                             Result       Flag       Units Drug Present and Declared for Prescription Verification   Hydromorphone                  353          EXPECTED   ng/mg creat   Norhydrocodone                 596          EXPECTED   ng/mg creat    Hydromorphone and norhydrocodone are expected metabolites of    hydrocodone. Sources of hydrocodone are scheduled prescription    medications.  Hydromorphone is also available as a scheduled    prescription medication. Drug Absent but Declared for Prescription Verification   Hydrocodone                    Not Detected UNEXPECTED ng/mg creat    Hydrocodone is almost always present in patients taking this drug    consistently. Absence of hydrocodone could be due to lapse of    time since the last dose or unusual pharmacokinetics (rapid    metabolism). ==================================================================== Test  Result    Flag   Units      Ref Range   Creatinine              74               mg/dL      >=20 ==================================================================== Declared Medications:  The flagging and interpretation on this report are based on the  following declared medications.  Unexpected results may arise from  inaccuracies in the declared medications.  **Note: The testing scope of this panel includes these medications:  Hydrocodone (Norco)  **Note: The testing scope of this panel does not include following  reported medications:  Acetaminophen (Norco)   Albuterol  Betamethasone (Diprolene)  Divalproex (Depakote)  Estrogen (Premarin)  Fluticasone (Flonase)  Gabapentin (Neurontin)  Gemfibrozil (Lopid)  Hydrocortisone  Hydroxyzine  Icosapent Ethyl  Ketoconazole (Nizoral)  Losartan (Cozaar)  Meloxicam (Mobic)  Metformin (Glucophage)  Metoprolol (Toprol)  Mirabegron (Myrbetriq)  Omeprazole (Prilosec)  Oxybutynin (Ditropan)  Tiotropium (Spiriva)  Tizanidine (Zanaflex)  Tolterodine (Detrol)  Triamcinolone (Kenalog)  Venlafaxine (Effexor)  Vitamin D2 (Drisdol) ==================================================================== For clinical consultation, please call [phone]. ====================================================================    UDS interpretation: Compliant          Medication Assessment Form: Reviewed. Patient indicates being compliant with therapy Treatment compliance: Compliant Risk Assessment Profile: Aberrant behavior: See initial evaluations. None observed or detected today Comorbid factors increasing risk of overdose: See initial evaluation. No additional risks detected today Opioid risk tool (ORT):  Opioid Risk  08/31/2018  Alcohol 0  Illegal Drugs 0  Rx Drugs 0  Alcohol 0  Illegal Drugs 0  Rx Drugs 0  Age between 16-45 years  0  History of Preadolescent Sexual Abuse 0  Psychological Disease 0  ADD -  OCD -  Bipolar -  Depression 1  Opioid Risk Tool Scoring 1  Opioid Risk Interpretation Low Risk    ORT Scoring interpretation table:  Score <3 = Low Risk for SUD  Score between 4-7 = Moderate Risk for SUD  Score >8 = High Risk for Opioid Abuse   Risk of substance use disorder (SUD): Low  Risk Mitigation Strategies:  Patient Counseling: Covered Patient-Prescriber Agreement (PPA): Present and active  Notification to other healthcare providers: Done  Pharmacologic Plan: No change in therapy, at this time.             Laboratory Chemistry Profile   Renal Lab Results  Component  Value Date   BUN 18 04/23/2019   CREATININE 0.93 04/23/2019   LABCREA 128 62/13/0865   BCR NOT APPLICABLE 78/46/9629   GFRAA 75 04/23/2019   GFRNONAA 65 04/23/2019   SPECGRAV 1.010 08/28/2018   PHUR 6.0 08/28/2018   PROTEINUR Negative 08/28/2018     Electrolytes Lab Results  Component Value Date   NA 145 04/23/2019   K 4.7 04/23/2019   CL 104 04/23/2019   CALCIUM 10.6 (H) 04/23/2019     Hepatic Lab Results  Component Value Date   AST 15 04/23/2019   ALT 15 04/23/2019   ALBUMIN 4.3 03/03/2017   ALKPHOS 53 03/03/2017   LIPASE 27 08/03/2017     ID No results found for: LYMEIGGIGMAB, HIV, SARSCOV2NAA, STAPHAUREUS, MRSAPCR, HCVAB, PREGTESTUR, RMSFIGG, QFVRPH1IGG, QFVRPH2IGG, LYMEIGGIGMAB   Bone Lab Results  Component Value Date   VD25OH 14 (L) 04/23/2019     Endocrine Lab Results  Component Value Date   GLUCOSE 106 (H) 04/23/2019   GLUCOSEU Negative 08/28/2018   HGBA1C 5.8 (H) 04/23/2019  TSH 1.33 06/06/2017     Neuropathy Lab Results  Component Value Date   HGBA1C 5.8 (H) 04/23/2019     CNS No results found for: COLORCSF, APPEARCSF, RBCCOUNTCSF, WBCCSF, POLYSCSF, LYMPHSCSF, EOSCSF, PROTEINCSF, GLUCCSF, JCVIRUS, CSFOLI, IGGCSF, LABACHR, ACETBL, LABACHR, ACETBL   Inflammation (CRP: Acute  ESR: Chronic) No results found for: CRP, ESRSEDRATE, LATICACIDVEN   Rheumatology No results found for: RF, ANA, LABURIC, URICUR, LYMEIGGIGMAB, LYMEABIGMQN, HLAB27   Coagulation Lab Results  Component Value Date   INR 1.00 09/12/2017   LABPROT 13.1 09/12/2017   APTT 30 09/12/2017   PLT 300 04/23/2019     Cardiovascular Lab Results  Component Value Date   CKTOTAL 128 06/08/2012   CKMB 2.2 06/08/2012   TROPONINI < 0.02 06/08/2012   HGB 11.9 04/23/2019   HCT 35.1 04/23/2019     Screening No results found for: SARSCOV2NAA, COVIDSOURCE, STAPHAUREUS, MRSAPCR, HCVAB, HIV, PREGTESTUR   Cancer No results found for: CEA, CA125, LABCA2   Allergens No results found  for: ALMOND, APPLE, ASPARAGUS, AVOCADO, BANANA, BARLEY, BASIL, BAYLEAF, GREENBEAN, LIMABEAN, WHITEBEAN, BEEFIGE, REDBEET, BLUEBERRY, BROCCOLI, CABBAGE, MELON, CARROT, CASEIN, CASHEWNUT, CAULIFLOWER, CELERY     Note: Lab results reviewed.   Recent Diagnostic Imaging Results  DG Knee Complete 4 Views Left CLINICAL DATA:  Knee pain, swelling and redness.  EXAM: LEFT KNEE - COMPLETE 4+ VIEW  COMPARISON:  None.  FINDINGS: There is a tiny marginal osteophyte on the medial tibial plateau. The bones of the knee otherwise appear normal. No joint space narrowing. No joint effusion.  IMPRESSION: Minimal arthritic changes of the medial compartment of the left knee.  Electronically Signed   By: Lorriane Shire M.D.   On: 11/02/2019 20:17 DG Knee Complete 4 Views Right CLINICAL DATA:  Knee pain and swelling and redness for 1 month. No injury.  EXAM: RIGHT KNEE - COMPLETE 4+ VIEW  COMPARISON:  Radiographs dated 02/27/2009  FINDINGS: There is no fracture or dislocation or joint effusion. Tiny marginal osteophytes on the patella and on the femoral condyles and medial tibial plateau. Slight narrowing of the lateral joint space.  IMPRESSION: No acute abnormality. Minimal degenerative changes.  Electronically Signed   By: Lorriane Shire M.D.   On: 11/02/2019 20:17  Complexity Note: Imaging results reviewed. Results shared with Ms. Soman, using Layman's terms.                               Meds   Current Outpatient Medications:  .  albuterol (PROVENTIL HFA;VENTOLIN HFA) 108 (90 Base) MCG/ACT inhaler, Inhale 2 puffs into the lungs every 6 (six) hours as needed for wheezing or shortness of breath., Disp: , Rfl:  .  ANORO ELLIPTA 62.5-25 MCG/INH AEPB, INL 1 PUFF PO D, Disp: , Rfl:  .  betamethasone dipropionate (DIPROLENE) 0.05 % cream, Apply 1 application topically 2 (two) times daily as needed. Avoid face , groin and axilla, Disp: 45 g, Rfl: 0 .  blood glucose meter kit and supplies,  Dispense based on patient and insurance preference. Use up to four times daily as directed. (FOR ICD-10 E10.9, E11.9)., Disp: 1 each, Rfl: 0 .  buPROPion (WELLBUTRIN XL) 150 MG 24 hr tablet, TAKE 1 TABLET(150 MG) BY MOUTH DAILY, Disp: 90 tablet, Rfl: 1 .  CONTOUR NEXT TEST test strip, USE UP TO FOUR TIMES DAILY AS DIRECTED, Disp: 100 each, Rfl: 5 .  divalproex (DEPAKOTE) 250 MG DR tablet, Take 1 tablet (250 mg  total) by mouth at bedtime., Disp: 90 tablet, Rfl: 1 .  fluticasone (FLONASE) 50 MCG/ACT nasal spray, Place 2 sprays into both nostrils daily., Disp: 16 g, Rfl: 2 .  gabapentin (NEURONTIN) 300 MG capsule, TAKE 1 CAPSULE(300 MG) BY MOUTH THREE TIMES DAILY, Disp: 90 capsule, Rfl: 5 .  gemfibrozil (LOPID) 600 MG tablet, Take 600 mg by mouth 2 (two) times daily., Disp: , Rfl:  .  glucose blood test strip, Contour Next Test Strips, Disp: , Rfl:  .  hydrocortisone 2.5 % cream, Apply 1 application topically 2 (two) times daily., Disp: , Rfl:  .  ketoconazole (NIZORAL) 2 % cream, Apply 1 application topically 2 (two) times daily., Disp: 60 g, Rfl: 0 .  loratadine (CLARITIN) 10 MG tablet, Take 1 tablet (10 mg total) by mouth daily., Disp: 90 tablet, Rfl: 1 .  losartan (COZAAR) 100 MG tablet, Take 1 tablet (100 mg total) by mouth daily., Disp: 90 tablet, Rfl: 1 .  meloxicam (MOBIC) 7.5 MG tablet, Take 1 tablet (7.5 mg total) by mouth daily., Disp: 30 tablet, Rfl: 0 .  metFORMIN (GLUCOPHAGE-XR) 500 MG 24 hr tablet, Take 2 tablets (1,000 mg total) by mouth daily., Disp: 180 tablet, Rfl: 1 .  metoprolol succinate (TOPROL-XL) 50 MG 24 hr tablet, Take 1 tablet (50 mg total) by mouth daily. Take with or immediately following a meal., Disp: 90 tablet, Rfl: 1 .  Microlet Lancets MISC, USE FOUR TIMES DAILY, Disp: 100 each, Rfl: 5 .  mirabegron ER (MYRBETRIQ) 50 MG TB24 tablet, Take 1 tablet (50 mg total) by mouth daily., Disp: 30 tablet, Rfl: 11 .  omega-3 acid ethyl esters (LOVAZA) 1 g capsule, Take 2 capsules  (2 g total) by mouth 2 (two) times daily., Disp: 360 capsule, Rfl: 1 .  omeprazole (PRILOSEC) 40 MG capsule, Take 1 capsule (40 mg total) by mouth daily., Disp: 90 capsule, Rfl: 1 .  OXYGEN, Inhale 2 L into the lungs continuous. , Disp: , Rfl:  .  tiZANidine (ZANAFLEX) 4 MG tablet, Take 1 tablet (4 mg total) by mouth every 8 (eight) hours as needed., Disp: 90 tablet, Rfl: 5 .  [START ON 11/20/2019] traMADol (ULTRAM) 50 MG tablet, Take 1 tablet (50 mg total) by mouth every 6 (six) hours as needed for severe pain. Each fill must last 30 days, Disp: 120 tablet, Rfl: 5 .  triamcinolone cream (KENALOG) 0.1 %, Apply topically 2 (two) times daily., Disp: 30 g, Rfl: 0 .  venlafaxine XR (EFFEXOR-XR) 75 MG 24 hr capsule, Take 3 capsules (225 mg total) by mouth daily with breakfast., Disp: 90 capsule, Rfl: 2 .  Vitamin D, Ergocalciferol, (DRISDOL) 1.25 MG (50000 UT) CAPS capsule, Take 1 capsule (50,000 Units total) by mouth every 7 (seven) days., Disp: 12 capsule, Rfl: 1 .  predniSONE (DELTASONE) 20 MG tablet, 2 tabs poqday 1-3, 1 tabs poqday 4-6 (Patient not taking: Reported on 11/13/2019), Disp: 9 tablet, Rfl: 0  ROS  Constitutional: Denies any fever or chills Gastrointestinal: No reported hemesis, hematochezia, vomiting, or acute GI distress Musculoskeletal: Denies any acute onset joint swelling, redness, loss of ROM, or weakness Neurological: No reported episodes of acute onset apraxia, aphasia, dysarthria, agnosia, amnesia, paralysis, loss of coordination, or loss of consciousness  Allergies  Ms. Remlinger is allergic to dapsone; hydrochlorothiazide; sulfa antibiotics; and sulfasalazine.  PFSH  Drug: Ms. Woods  reports no history of drug use. Alcohol:  reports no history of alcohol use. Tobacco:  reports that she quit smoking about 13  years ago. Her smoking use included cigarettes. She started smoking about 56 years ago. She has a 32.00 pack-year smoking history. She has never used smokeless  tobacco. Medical:  has a past medical history of Anemia, Anxiety, Arthritis, Colon cancer ([place]) (1941), Complication of anesthesia, Constipation, COPD (chronic obstructive pulmonary disease) ([place]), Depression, Diabetes mellitus without complication ([place]), Dysrhythmia, GERD (gastroesophageal reflux disease), H/O allergic rhinitis, Heart murmur, Hemorrhoid, Hyperlipidemia, Hypertension, Neck pain, chronic, Neuritis, Ovarian failure, Personal history of chemotherapy, Personal history of radiation therapy, PONV (postoperative nausea and vomiting), Pre-diabetes (2019), Proteinuria, Psoriasis, Sleep apnea (09/12/2016), Tachycardia, paroxysmal ([place]), and Urinary incontinence. Surgical: Ms. Vanyo  has a past surgical history that includes Tubal ligation; tonsillectomy (1959); herniated disc repair (2001); Anterior fusion lumbar spine (1990); Neck surgery; Mass excision (Left, 02/23/2016); Colonoscopy (2018); Portacath placement (Left, 03/12/2016); Cardiac catheterization (2009? ); Cardiac catheterization (05/2012); Port-a-cath removal (2018); Liver biopsy (N/A, 09/12/2017); Cholecystectomy (N/A, 09/12/2017); and Trigger finger release (Right, 09/2018). Family: family history includes Asthma in her mother; Breast cancer in her maternal aunt; Heart attack in her mother; Liver cancer in her father.  Constitutional Exam  General appearance: Well nourished, well developed, and well hydrated. In no apparent acute distress Vitals:   11/13/19 1021  BP: 118/81  Pulse: 90  Resp: 18  Temp: (!) 96.7 F (35.9 C)  TempSrc: Temporal  SpO2: 90%  Weight: 188 lb (85.3 kg)  Height: 5' 4"  (1.626 m)   BMI Assessment: Estimated body mass index is 32.27 kg/m as calculated from the following:   Height as of this encounter: 5' 4"  (1.626 m).   Weight as of this encounter: 188 lb (85.3 kg).  BMI interpretation table: BMI level Category Range association with higher incidence of chronic pain  <18 kg/m2 Underweight   18.5-24.9 kg/m2  Ideal body weight   25-29.9 kg/m2 Overweight Increased incidence by 20%  30-34.9 kg/m2 Obese (Class I) Increased incidence by 68%  35-39.9 kg/m2 Severe obesity (Class II) Increased incidence by 136%  >40 kg/m2 Extreme obesity (Class III) Increased incidence by 254%   Patient's current BMI Ideal Body weight  Body mass index is 32.27 kg/m. Ideal body weight: 54.7 kg (120 lb 9.5 oz) Adjusted ideal body weight: 66.9 kg (147 lb 8.9 oz)   BMI Readings from Last 4 Encounters:  11/13/19 32.27 kg/m  11/02/19 32.13 kg/m  10/02/19 31.60 kg/m  08/21/19 31.93 kg/m   Wt Readings from Last 4 Encounters:  11/13/19 188 lb (85.3 kg)  11/02/19 187 lb 3.2 oz (84.9 kg)  10/02/19 184 lb 1.6 oz (83.5 kg)  08/21/19 186 lb (84.4 kg)    Psych/Mental status: Alert, oriented x 3 (person, place, & time)       Eyes: PERLA Respiratory: No evidence of acute respiratory distress  Cervical Spine Exam  Skin & Axial Inspection: No masses, redness, edema, swelling, or associated skin lesions Alignment: Symmetrical Functional ROM: Pain restricted ROM, to the left Stability: No instability detected Muscle Tone/Strength: Functionally intact. No obvious neuro-muscular anomalies detected. Sensory (Neurological): Dermatomal pain pattern Palpation: No palpable anomalies              Upper Extremity (UE) Exam    Side: Right upper extremity  Side: Left upper extremity  Skin & Extremity Inspection: Skin color, temperature, and hair growth are WNL. No peripheral edema or cyanosis. No masses, redness, swelling, asymmetry, or associated skin lesions. No contractures.  Skin & Extremity Inspection: Skin color, temperature, and hair growth are WNL. No peripheral edema or cyanosis. No  masses, redness, swelling, asymmetry, or associated skin lesions. No contractures.  Functional ROM: Unrestricted ROM          Functional ROM: Decreased ROM for shoulder and elbow  Muscle Tone/Strength: Functionally intact. No obvious  neuro-muscular anomalies detected.  Muscle Tone/Strength: Functionally intact. No obvious neuro-muscular anomalies detected.  Sensory (Neurological): Unimpaired          Sensory (Neurological): Dermatomal pain pattern          Palpation: No palpable anomalies              Palpation: No palpable anomalies              Provocative Test(s):  Phalen's test: deferred Tinel's test: deferred Apley's scratch test (touch opposite shoulder):  Action 1 (Across chest): deferred Action 2 (Overhead): deferred Action 3 (LB reach): deferred   Provocative Test(s):  Phalen's test: deferred Tinel's test: deferred Apley's scratch test (touch opposite shoulder):  Action 1 (Across chest): Decreased ROM Action 2 (Overhead): Decreased ROM Action 3 (LB reach): Decreased ROM    Thoracic Spine Area Exam  Skin & Axial Inspection: No masses, redness, or swelling Alignment: Symmetrical Functional ROM: Unrestricted ROM Stability: No instability detected Muscle Tone/Strength: Functionally intact. No obvious neuro-muscular anomalies detected. Sensory (Neurological): Unimpaired Muscle strength & Tone: No palpable anomalies  Lumbar Exam  Skin & Axial Inspection: No masses, redness, or swelling Alignment: Symmetrical Functional ROM: Unrestricted ROM       Stability: No instability detected Muscle Tone/Strength: Functionally intact. No obvious neuro-muscular anomalies detected. Sensory (Neurological): Unimpaired Palpation: No palpable anomalies       Provocative Tests: Hyperextension/rotation test: deferred today       Lumbar quadrant test (Kemp's test): deferred today       Lateral bending test: deferred today       Patrick's Maneuver: deferred today                   FABER* test: (+) for bilateral S-I arthralgia             S-I anterior distraction/compression test: Unchanged         S-I lateral compression test: deferred today         S-I Thigh-thrust test: deferred today         S-I Gaenslen's test:  deferred today         *(Flexion, ABduction and External Rotation)  Gait & Posture Assessment  Ambulation: Unassisted Gait: Relatively normal for age and body habitus Posture: WNL   Lower Extremity Exam    Side: Right lower extremity  Side: Left lower extremity  Stability: No instability observed          Stability: No instability observed          Skin & Extremity Inspection: Skin color, temperature, and hair growth are WNL. No peripheral edema or cyanosis. No masses, redness, swelling, asymmetry, or associated skin lesions. No contractures.  Skin & Extremity Inspection: Skin color, temperature, and hair growth are WNL. No peripheral edema or cyanosis. No masses, redness, swelling, asymmetry, or associated skin lesions. No contractures.  Functional ROM: Unrestricted ROM                  Functional ROM: Unrestricted ROM                  Muscle Tone/Strength: Functionally intact. No obvious neuro-muscular anomalies detected.  Muscle Tone/Strength: Functionally intact. No obvious neuro-muscular anomalies detected.  Sensory (Neurological): Unimpaired  Sensory (Neurological): Unimpaired        DTR: Patellar: deferred today Achilles: deferred today Plantar: deferred today  DTR: Patellar: deferred today Achilles: deferred today Plantar: deferred today  Palpation: No palpable anomalies  Palpation: No palpable anomalies   Assessment   Status Diagnosis  Controlled Controlled Controlled 1. Chronic pain syndrome   2. Chronic cervical pain   3. Cervical radiculopathy (left)   4. Cervicalgia   5. Myofascial pain syndrome, cervical   6. Bilateral hip pain   7. Chronic SI joint pain      1. Chronic pain syndrome - tiZANidine (ZANAFLEX) 4 MG tablet; Take 1 tablet (4 mg total) by mouth every 8 (eight) hours as needed.  Dispense: 90 tablet; Refill: 5 - traMADol (ULTRAM) 50 MG tablet; Take 1 tablet (50 mg total) by mouth every 6 (six) hours as needed for severe pain. Each fill must last  30 days  Dispense: 120 tablet; Refill: 5 - gabapentin (NEURONTIN) 300 MG capsule; TAKE 1 CAPSULE(300 MG) BY MOUTH THREE TIMES DAILY  Dispense: 90 capsule; Refill: 5   2. Bilateral hip pain -Consider intra-articular hip steroid injection depending upon x-ray results. - DG HIP UNILAT W OR W/O PELVIS 2-3 VIEWS RIGHT; Future - DG HIP UNILAT W OR W/O PELVIS 2-3 VIEWS LEFT; Future  3. Chronic SI joint pain - DG Si Joints; Future   Plan of Care  Pharmacotherapy (Medications Ordered): Meds ordered this encounter  Medications  . tiZANidine (ZANAFLEX) 4 MG tablet    Sig: Take 1 tablet (4 mg total) by mouth every 8 (eight) hours as needed.    Dispense:  90 tablet    Refill:  5  . traMADol (ULTRAM) 50 MG tablet    Sig: Take 1 tablet (50 mg total) by mouth every 6 (six) hours as needed for severe pain. Each fill must last 30 days    Dispense:  120 tablet    Refill:  5    [place] STOP ACT - Not applicable. Fill one day early if pharmacy is closed on scheduled refill date.  . gabapentin (NEURONTIN) 300 MG capsule    Sig: TAKE 1 CAPSULE(300 MG) BY MOUTH THREE TIMES DAILY    Dispense:  90 capsule    Refill:  5   Orders:  Orders Placed This Encounter  Procedures  . DG HIP UNILAT W OR W/O PELVIS 2-3 VIEWS RIGHT    Standing Status:   Future    Standing Expiration Date:   11/12/2020    Scheduling Instructions:     Please describe any evidence of DJD, such as joint narrowing, asymmetry, cysts, or any anomalies in bone density, production, or erosion.    Order Specific Question:   Reason for Exam (SYMPTOM  OR DIAGNOSIS REQUIRED)    Answer:   Right hip pain/arthralgia    Order Specific Question:   Preferred imaging location?    Answer:   [facility]    Order Specific Question:   Call Results- Best Contact Number?    Answer:   [phone] (Pain Clinic facility) (Dr. Dossie Arbour)  . DG HIP UNILAT W OR W/O PELVIS 2-3 VIEWS LEFT    Standing Status:   Future    Standing Expiration Date:   11/12/2020     Scheduling Instructions:     Please describe any evidence of DJD, such as joint narrowing, asymmetry, cysts, or any anomalies in bone density, production, or erosion.    Order Specific Question:   Reason for Exam (SYMPTOM  OR DIAGNOSIS REQUIRED)    Answer:   Right hip pain/arthralgia    Order Specific Question:   Preferred imaging location?    Answer:   [facility]    Order Specific Question:   Call Results- Best Contact Number?    Answer:   [phone] (Pain Clinic facility) (Dr. Dossie Arbour)  . DG Si Joints    Standing Status:   Future    Standing Expiration Date:   02/12/2020    Order Specific Question:   Reason for Exam (SYMPTOM  OR DIAGNOSIS REQUIRED)    Answer:   Right hip pain/arthralgia    Order Specific Question:   Preferred imaging location?    Answer:   [facility]    Order Specific Question:   Call Results- Best Contact Number?    Answer:   [phone] [facility])  . ToxASSURE Select 13 (MW), Urine    Volume: 30 ml(s). Minimum 3 ml of urine is needed. Document temperature of fresh sample. Indications: Long term (current) use of opiate analgesic (Z79.891)    Imaging Orders     DG HIP UNILAT W OR W/O PELVIS 2-3 VIEWS RIGHT     DG HIP UNILAT W OR W/O PELVIS 2-3 VIEWS LEFT     DG Si Joints Planned follow-up:   Return in about 6 months (around 05/14/2020) for Medication Management, in person.    Recent Visits No visits were found meeting these conditions.  Showing recent visits within past 90 days and meeting all other requirements   Today's Visits Date Type Provider Dept  11/13/19 Office Visit Gillis Santa, MD [facility]  Showing today's visits and meeting all other requirements   Future Appointments No visits were found meeting these conditions.  Showing future appointments within next 90 days and meeting all other requirements   Primary Care Physician: Steele Sizer, MD Location: [facility] Note by:  Gillis Santa, MD Date: 11/13/2019; Time: 10:51 AM  Note: This dictation was prepared with Dragon dictation. Any transcriptional errors that may result from this process are unintentional.

## 2019-11-13 NOTE — Progress Notes (Signed)
Safety precautions to be maintained throughout the outpatient stay will include: orient to surroundings, keep bed in low position, maintain call bell within reach at all times, provide assistance with transfer out of bed and ambulation.  

## 2019-11-13 NOTE — Progress Notes (Signed)
Nursing Pain Medication Assessment:  Safety precautions to be maintained throughout the outpatient stay will include: orient to surroundings, keep bed in low position, maintain call bell within reach at all times, provide assistance with transfer out of bed and ambulation.  Medication Inspection Compliance: Pill count conducted under aseptic conditions, in front of the patient. Neither the pills nor the bottle was removed from the patient's sight at any time. Once count was completed pills were immediately returned to the patient in their original bottle.  Medication: Tramadol (Ultram) Pill/Patch Count: 39 of 120 pills remain Pill/Patch Appearance: Markings consistent with prescribed medication Bottle Appearance: Standard pharmacy container. Clearly labeled. Filled Date: 03 / 15 / 21 Last Medication intake:  Today

## 2019-11-14 ENCOUNTER — Encounter: Payer: Self-pay | Admitting: Family Medicine

## 2019-11-14 ENCOUNTER — Telehealth: Payer: Self-pay

## 2019-11-14 DIAGNOSIS — M533 Sacrococcygeal disorders, not elsewhere classified: Secondary | ICD-10-CM

## 2019-11-14 DIAGNOSIS — M47818 Spondylosis without myelopathy or radiculopathy, sacral and sacrococcygeal region: Secondary | ICD-10-CM

## 2019-11-14 NOTE — Addendum Note (Signed)
Addended by: Gillis Santa on: 11/14/2019 02:13 PM   Modules accepted: Orders

## 2019-11-14 NOTE — Telephone Encounter (Signed)
Called patient and review xray of Hip. Patient with understanding and would like to have procedure done for her SI joint.. Can you place an order so that our secretaries can schedule her. thanks

## 2019-11-14 NOTE — Telephone Encounter (Signed)
Can you please call and schedule procedure appt. She is expecting a call and an order was placed.

## 2019-11-15 ENCOUNTER — Encounter: Payer: Self-pay | Admitting: Family Medicine

## 2019-11-15 ENCOUNTER — Other Ambulatory Visit: Payer: Self-pay | Admitting: Student in an Organized Health Care Education/Training Program

## 2019-11-15 ENCOUNTER — Ambulatory Visit: Payer: Medicare HMO | Admitting: Family Medicine

## 2019-11-15 ENCOUNTER — Telehealth: Payer: Self-pay | Admitting: Student in an Organized Health Care Education/Training Program

## 2019-11-15 DIAGNOSIS — G894 Chronic pain syndrome: Secondary | ICD-10-CM

## 2019-11-15 DIAGNOSIS — G8929 Other chronic pain: Secondary | ICD-10-CM

## 2019-11-15 MED ORDER — TRAMADOL HCL 50 MG PO TABS: 50.0000 mg | ORAL_TABLET | Freq: Four times a day (QID) | ORAL | 5 refills | Status: DC | PRN

## 2019-11-15 MED ORDER — GABAPENTIN 300 MG PO CAPS: ORAL_CAPSULE | ORAL | 5 refills | Status: DC

## 2019-11-15 MED ORDER — TIZANIDINE HCL 4 MG PO TABS: 4.0000 mg | ORAL_TABLET | Freq: Three times a day (TID) | ORAL | 5 refills | Status: DC | PRN

## 2019-11-15 NOTE — Telephone Encounter (Signed)
Not sure about this. I only talked to her about a procedure.

## 2019-11-15 NOTE — Telephone Encounter (Signed)
Spoke with patient and she states that Rx's for Gabapentin, Zanaflex and Tramadol were sent to [facility] and were supposed to be sent to the [facility] in [place].  States that she did tell the nurse then when going over info in chart.  I told patient I would send the message to Dr Holley Raring.

## 2019-11-15 NOTE — Telephone Encounter (Signed)
Please call patient to check on this about her scripts so she can get her meds.

## 2019-11-15 NOTE — Telephone Encounter (Signed)
Patient states she let Nurse know about change in pharmacy but meds still were sent to old pharmacy. Please check this and let patient know what can be done.

## 2019-11-17 LAB — TOXASSURE SELECT 13 (MW), URINE

## 2019-11-19 NOTE — Progress Notes (Signed)
Called to [facility] in [place] and asked to cancel all Rx's from [place], Tramadol, gabapentin and zanaflex.  Those are now closed.

## 2019-11-20 ENCOUNTER — Ambulatory Visit: Payer: Medicare HMO | Admitting: Family Medicine

## 2019-11-26 ENCOUNTER — Ambulatory Visit: Payer: Medicare HMO | Admitting: Student in an Organized Health Care Education/Training Program

## 2019-12-04 ENCOUNTER — Ambulatory Visit (INDEPENDENT_AMBULATORY_CARE_PROVIDER_SITE_OTHER): Payer: Medicare HMO | Admitting: Family Medicine

## 2019-12-04 ENCOUNTER — Telehealth: Payer: Self-pay | Admitting: Family Medicine

## 2019-12-04 ENCOUNTER — Encounter: Payer: Self-pay | Admitting: Family Medicine

## 2019-12-04 VITALS — BP 170/104 | HR 79 | Ht 64.0 in | Wt 186.0 lb

## 2019-12-04 DIAGNOSIS — F339 Major depressive disorder, recurrent, unspecified: Secondary | ICD-10-CM | POA: Diagnosis not present

## 2019-12-04 DIAGNOSIS — R110 Nausea: Secondary | ICD-10-CM

## 2019-12-04 DIAGNOSIS — R194 Change in bowel habit: Secondary | ICD-10-CM | POA: Diagnosis not present

## 2019-12-04 DIAGNOSIS — R69 Illness, unspecified: Secondary | ICD-10-CM | POA: Diagnosis not present

## 2019-12-04 DIAGNOSIS — I10 Essential (primary) hypertension: Secondary | ICD-10-CM

## 2019-12-04 MED ORDER — AMLODIPINE BESYLATE 2.5 MG PO TABS: 2.5000 mg | ORAL_TABLET | Freq: Every day | ORAL | 0 refills | Status: DC

## 2019-12-04 MED ORDER — ARIPIPRAZOLE 5 MG PO TABS: 5.0000 mg | ORAL_TABLET | Freq: Every day | ORAL | 0 refills | Status: DC

## 2019-12-04 NOTE — Progress Notes (Signed)
Name: Judy Allen   MRN: [id number]    DOB: 1955-11-24   Date:12/04/2019       Progress Note  Subjective  Chief Complaint  Chief Complaint  Patient presents with   Follow-up   Chronic recurrent major depressive disorder ([place])   Hypertension    States her BP has been elevated for the past 3 months    I connected with  [name] on 12/04/19 at  3:00 PM EDT by telephone and verified that I am speaking with the correct person using two identifiers.  I discussed the limitations, risks, security and privacy concerns of performing an evaluation and management service by telephone and the availability of in person appointments. Staff also discussed with the patient that there may be a patient responsible charge related to this service. Patient Location: at home  Provider Location: [facility]    HPI  HTN: she states bp has been spiking , she states usually high at night and first check of the morning. It has been in the 170's-100's, occasionally goes dow to 118-120's/80's. She states she has a slightly headaches and face feels flushed, hot and tingling when bp is high . She was on HCTZ but stopped back in 2020 because of hypercalcemia.  She states only new medication is meloxicam, but started to spike before that  MDD: she is now back on 225 mg of Effexor, Wellbutrin and Depakote,  daily however stress is higher, her daughter is pregnant ( no longer dating the father of the baby), [name]'s middle daughter attempted suicide the day after . She is really worried about them. She is also upset because she will not be able to be with the baby when he is first born. 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 [name] is pregnant and will deliver her baby in May. She is noticing that she has been staying inside her room more often. She is not sure why. Explained importance of following up with psychiatrist, but she refuses. We will  continue Effexor, we will stop Wellbutrin because of elevated bp and also change from Depakote to Abilify and follow up in 1 months  Nausea: she has noticed nausea over the past few weeks, she states she feels hungry but when she starts to eat she feels nauseated , no vomiting, no weight loss.  She is status post-cholecystectomy. She states a couple of months ago she was having bowel movements daily that lasted for a few weeks ( she is usually constipated), now her bowel movements are back to normal frequency about once a week but saw mucus mixed in stools. She is having some lower abdominal pain. No blood in stools.   Patient Active Problem List   Diagnosis Date Noted   Hypoxia 08/25/2019   Hyperparathyroidism ([place]) 08/25/2019   Diabetes mellitus type 2 in obese ([place]) 11/06/2018   Cervical radiculopathy (left) 08/31/2018   Osteoarthritis of spine with radiculopathy, cervical region 08/31/2018   History of anal cancer 05/30/2018   Myofascial pain syndrome, cervical 05/16/2018   Chronic pain syndrome 05/16/2018   Cervical facet joint syndrome 05/16/2018   Perihepatitis ([place]) 09/27/2017   Abnormal liver function test 09/27/2017   Fatty liver 12/22/2016   Chronic respiratory failure with hypoxia, on home oxygen therapy ([place]) 07/28/2016   Centrilobular emphysema ([place]) 07/14/2016   Abnormal Pap smear of cervix 04/14/2016   Anal squamous cell carcinoma ([place]) 03/09/2016   Metastatic  squamous cell carcinoma ([place]) 02/25/2016   Non-thrombocytopenic purpura (HCC) 01/12/2016   Chronic constipation 10/16/2015   History of fusion of cervical spine 04/02/2015   Benign hypertension 01/16/2015   Cervicalgia 01/16/2015   CN (constipation) 01/16/2015   Gastric reflux 01/16/2015   Hypertriglyceridemia 01/16/2015   Edema leg 01/16/2015   Chronic recurrent major depressive disorder ([place]) 84/16/6063   Dysmetabolic syndrome 01/60/1093   Obstructive apnea 01/16/2015    Psoriasis 01/16/2015   Allergic rhinitis 01/16/2015   Bursitis, trochanteric 01/16/2015   GAD (generalized anxiety disorder) 01/16/2015   Tachycardia, paroxysmal ([place])     Past Surgical History:  Procedure Laterality Date   ANTERIOR FUSION LUMBAR SPINE  1990   plate in back, not metal   CARDIAC CATHETERIZATION  2009?    [facility]   CARDIAC CATHETERIZATION  05/2012   [facility]: Mild hypertension 37/12 with a mean pressure of 21 mm mercury   CHOLECYSTECTOMY N/A 09/12/2017   Procedure: LAPAROSCOPIC CHOLECYSTECTOMY WITH INTRAOPERATIVE CHOLANGIOGRAM;  Surgeon: Robert Bellow, MD;  Location: [facility];  Service: General;  Laterality: N/A;   COLONOSCOPY  2018   herniated disc repair  2001   LIVER BIOPSY N/A 09/12/2017   Procedure: LIVER BIOPSY;  Surgeon: Robert Bellow, MD;  Location: [facility];  Service: General;  Laterality: N/A;   MASS EXCISION Left 02/23/2016   Procedure: EXCISION MASS;  Surgeon: Christene Lye, MD;  Location: [facility];  Service: General;  Laterality: Left;   NECK SURGERY     PORT-A-CATH REMOVAL  2018   PORTACATH PLACEMENT Left 03/12/2016   Procedure: INSERTION PORT-A-CATH;  Surgeon: Christene Lye, MD;  Location: [facility];  Service: General;  Laterality: Left;   tonsillectomy  1959   history   TRIGGER FINGER RELEASE Right 09/2018   Dr. Sabra Heck    TUBAL LIGATION     s/p    Family History  Problem Relation Age of Onset   Heart attack Mother    Asthma Mother    Liver cancer Father    Breast cancer Maternal Aunt    Bladder Cancer Neg Hx    Kidney cancer Neg Hx     Social History   Tobacco Use   Smoking status: Former Smoker    Packs/day: 1.00    Years: 32.00    Pack years: 32.00    Types: Cigarettes    Start date: 08/10/1963    Quit date: 01/15/2006    Years since quitting: 13.8   Smokeless tobacco: Never Used  Substance Use Topics   Alcohol use: No    Alcohol/week: 0.0 standard drinks    Current Outpatient  Medications:    albuterol (PROVENTIL HFA;VENTOLIN HFA) 108 (90 Base) MCG/ACT inhaler, Inhale 2 puffs into the lungs every 6 (six) hours as needed for wheezing or shortness of breath., Disp: , Rfl:    ANORO ELLIPTA 62.5-25 MCG/INH AEPB, INL 1 PUFF PO D, Disp: , Rfl:    betamethasone dipropionate (DIPROLENE) 0.05 % cream, Apply 1 application topically 2 (two) times daily as needed. Avoid face , groin and axilla, Disp: 45 g, Rfl: 0   blood glucose meter kit and supplies, Dispense based on patient and insurance preference. Use up to four times daily as directed. (FOR ICD-10 E10.9, E11.9)., Disp: 1 each, Rfl: 0   CONTOUR NEXT TEST test strip, USE UP TO FOUR TIMES DAILY AS DIRECTED, Disp: 100 each, Rfl: 5   fluticasone (FLONASE) 50 MCG/ACT nasal spray, Place 2 sprays into both nostrils daily., Disp: 16 g,  Rfl: 2   gabapentin (NEURONTIN) 300 MG capsule, TAKE 1 CAPSULE(300 MG) BY MOUTH THREE TIMES DAILY, Disp: 90 capsule, Rfl: 5   gemfibrozil (LOPID) 600 MG tablet, Take 600 mg by mouth 2 (two) times daily., Disp: , Rfl:    glucose blood test strip, Contour Next Test Strips, Disp: , Rfl:    hydrocortisone 2.5 % cream, Apply 1 application topically 2 (two) times daily., Disp: , Rfl:    ketoconazole (NIZORAL) 2 % cream, Apply 1 application topically 2 (two) times daily., Disp: 60 g, Rfl: 0   loratadine (CLARITIN) 10 MG tablet, Take 1 tablet (10 mg total) by mouth daily., Disp: 90 tablet, Rfl: 1   losartan (COZAAR) 100 MG tablet, Take 1 tablet (100 mg total) by mouth daily., Disp: 90 tablet, Rfl: 1   metFORMIN (GLUCOPHAGE-XR) 500 MG 24 hr tablet, Take 2 tablets (1,000 mg total) by mouth daily., Disp: 180 tablet, Rfl: 1   metoprolol succinate (TOPROL-XL) 50 MG 24 hr tablet, Take 1 tablet (50 mg total) by mouth daily. Take with or immediately following a meal., Disp: 90 tablet, Rfl: 1   Microlet Lancets MISC, USE FOUR TIMES DAILY, Disp: 100 each, Rfl: 5   omega-3 acid ethyl esters (LOVAZA) 1 g  capsule, Take 2 capsules (2 g total) by mouth 2 (two) times daily., Disp: 360 capsule, Rfl: 1   omeprazole (PRILOSEC) 40 MG capsule, Take 1 capsule (40 mg total) by mouth daily., Disp: 90 capsule, Rfl: 1   OXYGEN, Inhale 2 L into the lungs continuous. , Disp: , Rfl:    tiZANidine (ZANAFLEX) 4 MG tablet, Take 1 tablet (4 mg total) by mouth every 8 (eight) hours as needed., Disp: 90 tablet, Rfl: 5   traMADol (ULTRAM) 50 MG tablet, Take 1 tablet (50 mg total) by mouth every 6 (six) hours as needed for severe pain. Each fill must last 30 days, Disp: 120 tablet, Rfl: 5   triamcinolone cream (KENALOG) 0.1 %, Apply topically 2 (two) times daily., Disp: 30 g, Rfl: 0   venlafaxine XR (EFFEXOR-XR) 75 MG 24 hr capsule, Take 3 capsules (225 mg total) by mouth daily with breakfast., Disp: 90 capsule, Rfl: 2   Vitamin D, Ergocalciferol, (DRISDOL) 1.25 MG (50000 UT) CAPS capsule, Take 1 capsule (50,000 Units total) by mouth every 7 (seven) days., Disp: 12 capsule, Rfl: 1   amLODipine (NORVASC) 2.5 MG tablet, Take 1 tablet (2.5 mg total) by mouth daily., Disp: 30 tablet, Rfl: 0   ARIPiprazole (ABILIFY) 5 MG tablet, Take 1 tablet (5 mg total) by mouth daily., Disp: 30 tablet, Rfl: 0  Allergies  Allergen Reactions   Dapsone Other (See Comments)    Hypoxemia   Hydrochlorothiazide     History of hyperparathyroidism    Sulfa Antibiotics Rash   Sulfasalazine Rash    I personally reviewed active problem list, medication list, allergies, family history, social history, health maintenance with the patient/caregiver today.   ROS  Ten systems reviewed and is negative except as mentioned in HPI   Objective  Virtual encounter, vitals obtained at home  Today's Vitals   12/04/19 1341  BP: (!) 170/104  Pulse: 79  Weight: 186 lb (84.4 kg)  Height: 5' 4"  (1.626 m)   Body mass index is 31.93 kg/m.   Physical Exam  Awake, alert and oriented  PHQ2/9: Depression screen [facility] 2/9 12/04/2019  11/13/2019 11/02/2019 10/02/2019 08/21/2019  Decreased Interest 2 0 0 3 1  Down, Depressed, Hopeless 3 0 0 3 1  PHQ - 2 Score 5 0 0 6 2  Altered sleeping 2 - 0 1 3  Tired, decreased energy 2 - 0 3 3  Change in appetite 2 - 0 1 2  Feeling bad or failure about yourself  3 - 0 3 3  Trouble concentrating 2 - 0 0 0  Moving slowly or fidgety/restless 3 - 0 0 1  Suicidal thoughts 0 - 0 0 0  PHQ-9 Score 19 - 0 14 14  Difficult doing work/chores Somewhat difficult - Not difficult at all - Somewhat difficult  Some recent data might be hidden   PHQ-2/9 Result is positive.    Fall Risk: Fall Risk  11/13/2019 11/02/2019 08/21/2019 07/18/2019 04/20/2019  Falls in the past year? 0 0 0 0 0  Number falls in past yr: - 0 0 0 0  Injury with Fall? - 0 0 0 0  Follow up - - - - Falls evaluation completed     Assessment & Plan  1. Uncontrolled hypertension  - amLODipine (NORVASC) 2.5 MG tablet; Take 1 tablet (2.5 mg total) by mouth daily.  Dispense: 30 tablet; Refill: 0  2. Recent change in frequency of bowel movements  - Ambulatory referral to Gastroenterology  3. Nausea  - Ambulatory referral to Gastroenterology  4. Chronic recurrent major depressive disorder (HCC)  - ARIPiprazole (ABILIFY) 5 MG tablet; Take 1 tablet (5 mg total) by mouth daily.  Dispense: 30 tablet; Refill: 0 I discussed the assessment and treatment plan with the patient. The patient was provided an opportunity to ask questions and all were answered. The patient agreed with the plan and demonstrated an understanding of the instructions.   The patient was advised to call back or seek an in-person evaluation if the symptoms worsen or if the condition fails to improve as anticipated.  I provided 25 minutes of non-face-to-face time during this encounter.  Loistine Chance, MD

## 2019-12-04 NOTE — Patient Instructions (Signed)
We are stopped wellbutrin and Depakote Continue Effexor  Taking Abilify 5 mg at night You will also start on Norvasc ( amlodipine ) 2.5 mg daily

## 2019-12-05 ENCOUNTER — Encounter: Payer: Self-pay | Admitting: Gastroenterology

## 2019-12-06 ENCOUNTER — Encounter: Payer: Self-pay | Admitting: Gastroenterology

## 2019-12-06 ENCOUNTER — Telehealth: Payer: Self-pay

## 2019-12-06 ENCOUNTER — Telehealth (INDEPENDENT_AMBULATORY_CARE_PROVIDER_SITE_OTHER): Payer: Medicare HMO | Admitting: Gastroenterology

## 2019-12-06 DIAGNOSIS — K7581 Nonalcoholic steatohepatitis (NASH): Secondary | ICD-10-CM | POA: Diagnosis not present

## 2019-12-06 DIAGNOSIS — K769 Liver disease, unspecified: Secondary | ICD-10-CM

## 2019-12-06 DIAGNOSIS — K5900 Constipation, unspecified: Secondary | ICD-10-CM

## 2019-12-06 MED ORDER — POLYETHYLENE GLYCOL 3350 17 G PO PACK: 17.0000 g | PACK | Freq: Every day | ORAL | 0 refills | Status: AC

## 2019-12-06 NOTE — Telephone Encounter (Signed)
Called patient back and was informed to go to a [facility] location or here in the office. Patient preferred to go to a [place] location. Patient was also told about Miralax and the high fiber diet and to follow up in 2-3 months with Dr. Bonna Gains at the office (02/06/2020 @ 1:45 PM). Patient agreed. Patient had no further questions.

## 2019-12-06 NOTE — Patient Instructions (Signed)

## 2019-12-06 NOTE — Telephone Encounter (Signed)
Called patient and left a voicemail to call me back.

## 2019-12-06 NOTE — Progress Notes (Signed)
Judy Allen [address]  [place]  [place], [place] [address]  Main: [phone]  Fax: [phone]   Gastroenterology Consultation  Referring Provider:     Steele Sizer, MD Primary Care Physician:  Steele Sizer, MD Reason for Consultation:   Altered bowel habits        HPI:   Virtual Visit via Video Note  I connected with patient on 12/06/19 at 11:15 AM EDT by video ([email or web address]) and verified that I am speaking with the correct person using two identifiers.   I discussed the limitations, risks, security and privacy concerns of performing an evaluation and management service by video and the availability of in person appointments. I also discussed with the patient that there may be a patient responsible charge related to this service. The patient expressed understanding and agreed to proceed.  Location of the patient: Home Location of provider: Home Participating persons: Patient and provider only (Nursing staff checked in patient via phone but were not physically involved in the video interaction - see their notes)   History of Present Illness: Chief Complaint  Patient presents with  . New Patient (Initial Visit)  . diarrhea and constipation    Patient stated that she has had diarrhea x 2 mos  . Nausea    Patient stated that she has had nausea x 2 weeks w/o vomiting  . Abdominal Pain    Patient stated that she has had mid low abdominal pain x 2 mos    Judy Allen is a 64 y.o. y/o female referred for consultation & management  by Dr. Steele Sizer, MD.  Patient reports chronic history of constipation, and baseline has a bowel movement every 4 days.  Recently, had 1 episode where she had bowel movements with straining for 1 to 2 weeks and one episode of diarrhea in the middle.  No blood in stool.  Reports nausea but no vomiting for 2 weeks as well.  No weight loss.  Does not use anything for her bowels.  Previous history includes cholecystectomy in 2019  for gallbladder polyp and a liver biopsy done at the same time shows moderate steatohepatitis, grade 2 of 3, stage I brunt classification.  Pathology from cholecystectomy reports chronic cholecystitis.  Previous records in [place] also reviewed and patient has history of anal squamous cell cancer in 2017.  Colonoscopy done in 2017 with extent of exam being terminal ileum showed hyperplastic polyps.  2007 colonoscopy with 4 sessile polyps removed the sigmoid colon removed and random colon biopsies done due to chronic diarrhea.  Biopsy reports not available.  Upper endoscopy in 2007 showed an irregular Z-line and was otherwise normal  She also had an upper endoscopy with Dr. Domenica Fail in 2018 for dysphagia that was reported to be normal.  Past Medical History:  Diagnosis Date  . Anemia    H/O  . Anxiety   . Arthritis   . Colon cancer ([facility] [facility]) 2017   anal ca/chemo and rad  . Complication of anesthesia   . Constipation   . COPD (chronic obstructive pulmonary disease) ([facility])    also emphysema  . Depression   . Diabetes mellitus without complication ([place])   . Dysrhythmia    TACHYCARDIA-WELL CONTROLLED ON METOPROLO  . GERD (gastroesophageal reflux disease)   . H/O allergic rhinitis   . Heart murmur   . Hemorrhoid   . Hyperlipidemia   . Hypertension   . Neck pain, chronic   . Neuritis   . Ovarian  failure   . Personal history of chemotherapy   . Personal history of radiation therapy   . PONV (postoperative nausea and vomiting)   . Pre-diabetes 2019   just being followed.  no meds  . Proteinuria   . Psoriasis   . Sleep apnea 09/12/2016   8cm H2O with 21pm oxygen mask: Eson size Medium. Heated humidifier for nasal dryness.  . Tachycardia, paroxysmal ([place])   . Urinary incontinence     Past Surgical History:  Procedure Laterality Date  . ANTERIOR FUSION LUMBAR SPINE  1990   plate in back, not metal  . CARDIAC CATHETERIZATION  2009?    [facility];[place]  . CARDIAC CATHETERIZATION  05/2012     [facility]: Mild hypertension 37/12 with a mean pressure of 21 mm mercury  . CHOLECYSTECTOMY N/A 09/12/2017   Procedure: LAPAROSCOPIC CHOLECYSTECTOMY WITH INTRAOPERATIVE CHOLANGIOGRAM;  Surgeon: Robert Bellow, MD;  Location: [facility] ORS;  Service: General;  Laterality: N/A;  . COLONOSCOPY  2018  . herniated disc repair  2001  . LIVER BIOPSY N/A 09/12/2017   Procedure: LIVER BIOPSY;  Surgeon: Robert Bellow, MD;  Location: [facility] OR[facility];  Service: General;  Laterality: N/A;  . MASS EXCISION Left 02/23/2016   Procedure: EXCISION MASS;  Surgeon: Christene Lye, MD;  Location: [facility] ORS;  Service: General;  Laterality: Left;  . NECK SURGERY    . PORT-A-CATH REMOVAL  2018  . PORTACATH PLACEMENT Left 03/12/2016   Procedure: INSERTION PORT-A-CATH;  Surgeon: Christene Lye, MD;  Location: [facility] ORS;  Service: General;  Laterality: Left;  . tonsillectomy  1959   history  . TRIGGER FINGER RELEASE Right 09/2018   Dr. Sabra Heck   . TUBAL LIGATION     s/p    Prior to Admission medications   Medication Sig Start Date End Date Taking? Authorizing Provider  albuterol (PROVENTIL HFA;VENTOLIN HFA) 108 (90 Base) MCG/ACT inhaler Inhale 2 puffs into the lungs every 6 (six) hours as needed for wheezing or shortness of breath.   Yes [provider]  amLODipine (NORVASC) 2.5 MG tablet Take 1 tablet (2.5 mg total) by mouth daily. 12/04/19  Yes Sowles, Drue Stager, MD  ANORO ELLIPTA 62.5-25 MCG/INH AEPB INL 1 PUFF PO D 04/26/19  Yes [provider]  ARIPiprazole (ABILIFY) 5 MG tablet Take 1 tablet (5 mg total) by mouth daily. 12/04/19  Yes Sowles, Drue Stager, MD  betamethasone dipropionate (DIPROLENE) 0.05 % cream Apply 1 application topically 2 (two) times daily as needed. Avoid face , groin and axilla 02/12/19  Yes Steele Sizer, MD  blood glucose meter kit and supplies Dispense based on patient and insurance preference. Use up to four times daily as directed. (FOR ICD-10 E10.9, E11.9). 11/06/18  Yes  Sowles, Drue Stager, MD  CONTOUR NEXT TEST test strip USE UP TO FOUR TIMES DAILY AS DIRECTED 11/28/18  Yes Sowles, Drue Stager, MD  fluticasone (FLONASE) 50 MCG/ACT nasal spray Place 2 sprays into both nostrils daily. 10/23/18  Yes Sowles, Drue Stager, MD  gabapentin (NEURONTIN) 300 MG capsule TAKE 1 CAPSULE(300 MG) BY MOUTH THREE TIMES DAILY 11/15/19  Yes Gillis Santa, MD  gemfibrozil (LOPID) 600 MG tablet Take 600 mg by mouth 2 (two) times daily. 09/08/19  Yes [provider]  glucose blood test strip Contour Next Test Strips   Yes [provider]  hydrocortisone 2.5 % cream Apply 1 application topically 2 (two) times daily.   Yes [provider]  ketoconazole (NIZORAL) 2 % cream Apply 1 application topically 2 (two) times daily.  02/12/19  Yes Sowles, Drue Stager, MD  metFORMIN (GLUCOPHAGE-XR) 500 MG 24 hr tablet Take 2 tablets (1,000 mg total) by mouth daily. 10/23/18  Yes Sowles, Drue Stager, MD  metoprolol succinate (TOPROL-XL) 50 MG 24 hr tablet Take 1 tablet (50 mg total) by mouth daily. Take with or immediately following a meal. 08/21/19  Yes Sowles, Drue Stager, MD  Microlet Lancets MISC USE FOUR TIMES DAILY 11/28/18  Yes Steele Sizer, MD  omega-3 acid ethyl esters (LOVAZA) 1 g capsule Take 2 capsules (2 g total) by mouth 2 (two) times daily. 08/21/19  Yes Sowles, Drue Stager, MD  omeprazole (PRILOSEC) 40 MG capsule Take 1 capsule (40 mg total) by mouth daily. 08/21/19  Yes Sowles, Drue Stager, MD  OXYGEN Inhale 2 L into the lungs continuous.    Yes [provider]  tiZANidine (ZANAFLEX) 4 MG tablet Take 1 tablet (4 mg total) by mouth every 8 (eight) hours as needed. 11/15/19  Yes Gillis Santa, MD  traMADol (ULTRAM) 50 MG tablet Take 1 tablet (50 mg total) by mouth every 6 (six) hours as needed for severe pain. Each fill must last 30 days 11/20/19  Yes Gillis Santa, MD  triamcinolone cream (KENALOG) 0.1 % Apply topically 2 (two) times daily. 02/12/19  Yes Sowles, Drue Stager, MD  venlafaxine XR  (EFFEXOR-XR) 75 MG 24 hr capsule Take 3 capsules (225 mg total) by mouth daily with breakfast. 08/21/19  Yes Sowles, Drue Stager, MD  Vitamin D, Ergocalciferol, (DRISDOL) 1.25 MG (50000 UT) CAPS capsule Take 1 capsule (50,000 Units total) by mouth every 7 (seven) days. 04/26/19  Yes Sowles, Drue Stager, MD  loratadine (CLARITIN) 10 MG tablet Take 1 tablet (10 mg total) by mouth daily. Patient not taking: Reported on 12/05/2019 10/23/18   Steele Sizer, MD  polyethylene glycol (MIRALAX) 17 g packet Take 17 g by mouth daily. 12/06/19 01/05/20  Virgel Manifold, MD    Family History  Problem Relation Age of Onset  . Heart attack Mother   . Asthma Mother   . Liver cancer Father   . Breast cancer Maternal Aunt   . Bladder Cancer Neg Hx   . Kidney cancer Neg Hx   . Colon cancer Neg Hx      Social History   Tobacco Use  . Smoking status: Former Smoker    Packs/day: 1.00    Years: 32.00    Pack years: 32.00    Types: Cigarettes    Start date: 08/10/1963    Quit date: 01/15/2006    Years since quitting: 13.8  . Smokeless tobacco: Never Used  Substance Use Topics  . Alcohol use: No    Alcohol/week: 0.0 standard drinks  . Drug use: No    Allergies as of 12/06/2019 - Review Complete 12/06/2019  Allergen Reaction Noted  . Dapsone Other (See Comments) 09/24/2016  . Hydrochlorothiazide  05/30/2018  . Sulfa antibiotics Rash 09/18/2012  . Sulfasalazine Rash 09/18/2012    Review of Systems:    All systems reviewed and negative except where noted in HPI.   Observations/Objective:  Labs: CBC    Component Value Date/Time   WBC 5.6 04/23/2019 0000   RBC 3.81 04/23/2019 0000   HGB 11.9 04/23/2019 0000   HGB 12.4 06/08/2012 1117   HCT 35.1 04/23/2019 0000   HCT 36.6 06/08/2012 1117   PLT 300 04/23/2019 0000   PLT 293 06/08/2012 1117   MCV 92.1 04/23/2019 0000   MCV 87 06/08/2012 1117   MCH 31.2 04/23/2019 0000   MCHC 33.9 04/23/2019 0000  RDW 13.2 04/23/2019 0000   RDW 14.6 (H)  06/08/2012 1117   LYMPHSABS 1,450 04/23/2019 0000   MONOABS 440 03/03/2017 0808   EOSABS 241 04/23/2019 0000   BASOSABS 62 04/23/2019 0000   CMP     Component Value Date/Time   NA 145 04/23/2019 0000   NA 141 09/30/2015 0907   NA 144 06/08/2012 1117   K 4.7 04/23/2019 0000   K 3.5 06/08/2012 1117   CL 104 04/23/2019 0000   CL 110 (H) 06/08/2012 1117   CO2 30 04/23/2019 0000   CO2 24 06/08/2012 1117   GLUCOSE 106 (H) 04/23/2019 0000   GLUCOSE 79 06/08/2012 1117   BUN 18 04/23/2019 0000   BUN 13 09/30/2015 0907   BUN 13 06/08/2012 1117   CREATININE 0.93 04/23/2019 0000   CALCIUM 10.6 (H) 04/23/2019 0000   CALCIUM 8.9 06/08/2012 1117   PROT 6.9 04/23/2019 0000   PROT 7.0 09/30/2015 0907   ALBUMIN 4.3 03/03/2017 0808   ALBUMIN 4.5 09/30/2015 0907   AST 15 04/23/2019 0000   ALT 15 04/23/2019 0000   ALKPHOS 53 03/03/2017 0808   BILITOT 0.2 04/23/2019 0000   BILITOT 0.3 09/30/2015 0907   GFRNONAA 65 04/23/2019 0000   GFRAA 75 04/23/2019 0000    Imaging Studies: DG Si Joints  Result Date: 11/13/2019 CLINICAL DATA:  BILATERAL hip pain EXAM: BILATERAL SACROILIAC JOINTS - 3+ VIEW COMPARISON:  12/13/2014 FINDINGS: Osseous mineralization low normal. Mild degenerative changes of the hip joints bilaterally again identified. No fracture, dislocation or bone destruction. Symmetric sacral foramina. Degenerative disc and facet disease changes at visualized lower lumbar spine. IMPRESSION: Mild degenerative changes of the SI joints bilaterally. Degenerative disc and facet disease changes at visualized lower lumbar spine. Electronically Signed   By: Lavonia Dana M.D.   On: 11/13/2019 16:24   DG HIP UNILAT W OR W/O PELVIS 2-3 VIEWS LEFT  Result Date: 11/13/2019 CLINICAL DATA:  Pain in both hips EXAM: DG HIP (WITH OR WITHOUT PELVIS) 2-3V LEFT COMPARISON:  Right hip performed today FINDINGS: Hip joints and SI joints symmetric and unremarkable. No acute bony abnormality. Specifically, no fracture,  subluxation, or dislocation. IMPRESSION: No acute bony abnormality. Electronically Signed   By: Rolm Baptise M.D.   On: 11/13/2019 17:59   DG HIP UNILAT W OR W/O PELVIS 2-3 VIEWS RIGHT  Result Date: 11/13/2019 CLINICAL DATA:  Bilateral hip pain EXAM: DG HIP (WITH OR WITHOUT PELVIS) 2-3V RIGHT COMPARISON:  Left hip performed today FINDINGS: Hip joints and SI joints are symmetric and unremarkable. No acute bony abnormality. Specifically, no fracture, subluxation, or dislocation. IMPRESSION: No acute bony abnormality. Electronically Signed   By: Rolm Baptise M.D.   On: 11/13/2019 17:59    Assessment and Plan:   Judy Allen is a 64 y.o. y/o female has been referred for altered bowel habits  Assessment and Plan:  Patient has chronic constipation and had one episode of diarrhea after 1 to 2-week history of straining with bowel movements  She likely had postobstructive diarrhea  High-fiber diet MiraLAX daily with goal of 1-2 soft bowel movements daily.  If not at goal, patient instructed to increase dose to twice daily.  If loose stools with the medication, patient asked to decrease the medication to every other day, or half dose daily.  Patient verbalized understanding  She does not have any vomiting.  Will obtain H. pylori serology due to intermittent nausea.  Due to previous diagnosis of NASH with liver biopsy  and no recent liver work-up, or previous complete work-up for fatty liver/NASH, will order labs at this time.  She also had an MRI in February 2021 that showed an unchanged liver lesion.  We will contact the radiologist at [facility] to discuss findings and determine follow-up for the same  Follow Up Instructions:   I discussed the assessment and treatment plan with the patient. The patient was provided an opportunity to ask questions and all were answered. The patient agreed with the plan and demonstrated an understanding of the instructions.   The patient was advised to call back or  seek an in-person evaluation if the symptoms worsen or if the condition fails to improve as anticipated.  I provided 15 minutes of face-to-face time via video software during this encounter.  Additional time was spent in reviewing patient's chart, placing orders etc.   Virgel Manifold, MD  Speech recognition software was used to dictate the above note.

## 2019-12-12 DIAGNOSIS — K7581 Nonalcoholic steatohepatitis (NASH): Secondary | ICD-10-CM | POA: Diagnosis not present

## 2019-12-13 LAB — IRON AND TIBC
Iron Saturation: 22 % (ref 15–55)
Iron: 123 ug/dL (ref 27–139)
Total Iron Binding Capacity: 571 ug/dL (ref 250–450)
UIBC: 448 ug/dL — ABNORMAL HIGH (ref 118–369)

## 2019-12-14 LAB — FERRITIN: Ferritin: 126 ng/mL (ref 15–150)

## 2019-12-14 LAB — HEPATIC FUNCTION PANEL
ALT: 40 IU/L — ABNORMAL HIGH (ref 0–32)
AST: 37 IU/L (ref 0–40)
Albumin: 5.2 g/dL — ABNORMAL HIGH (ref 3.8–4.8)
Alkaline Phosphatase: 70 IU/L (ref 39–117)
Bilirubin Total: 0.3 mg/dL (ref 0.0–1.2)
Bilirubin, Direct: 0.17 mg/dL (ref 0.00–0.40)
Total Protein: 7.6 g/dL (ref 6.0–8.5)

## 2019-12-14 LAB — MITOCHONDRIAL/SMOOTH MUSCLE AB PNL
Mitochondrial Ab: <20 Units (ref 0.0–20.0)
Smooth Muscle Ab: 12 Units (ref 0–19)

## 2019-12-14 LAB — HEPATITIS B SURFACE ANTIGEN: Hepatitis B Surface Ag: NEGATIVE

## 2019-12-14 LAB — ANTI-MICROSOMAL ANTIBODY LIVER / KIDNEY: LKM1 Ab: 0.7 Units (ref 0.0–20.0)

## 2019-12-14 LAB — ANA: ANA Titer 1: NEGATIVE

## 2019-12-14 LAB — H PYLORI, IGM, IGG, IGA AB
H pylori, IgM Abs: <9 units (ref 0.0–8.9)
H. pylori, IgA Abs: <9 units (ref 0.0–8.9)
H. pylori, IgG AbS: 0.12 Index Value (ref 0.00–0.79)

## 2019-12-14 LAB — HEPATITIS A ANTIBODY, TOTAL: hep A Total Ab: NEGATIVE

## 2019-12-14 LAB — IGG 4: IgG, Subclass 4: 1 mg/dL — ABNORMAL LOW (ref 2–96)

## 2019-12-14 LAB — HCV COMMENT:

## 2019-12-14 LAB — HEPATITIS B CORE ANTIBODY, TOTAL: Hep B Core Total Ab: NEGATIVE

## 2019-12-14 LAB — HEPATITIS C ANTIBODY (REFLEX): HCV Ab: <0.1 s/co ratio (ref 0.0–0.9)

## 2019-12-14 LAB — CERULOPLASMIN: Ceruloplasmin: 29.3 mg/dL (ref 19.0–39.0)

## 2019-12-14 LAB — HEPATITIS B SURFACE ANTIBODY,QUALITATIVE: Hep B Surface Ab, Qual: NONREACTIVE

## 2019-12-17 ENCOUNTER — Encounter: Payer: Self-pay | Admitting: Family Medicine

## 2019-12-17 ENCOUNTER — Telehealth: Payer: Self-pay

## 2019-12-17 ENCOUNTER — Telehealth: Payer: Self-pay | Admitting: Gastroenterology

## 2019-12-17 NOTE — Telephone Encounter (Signed)
Called patient and left her a voicemail to call me back.

## 2019-12-17 NOTE — Telephone Encounter (Signed)
-----   Message from Virgel Manifold, MD sent at 12/17/2019 11:18 AM EDT ----- Herb Grays please let the patient know, her lab results were benign.  I reviewed her MRI from February 2021 with the radiologist at [facility].  The liver lesions that were seen in February are indeterminant as per the radiologist.  They have not changed since the previous imaging study 8 months prior to this according to the radiologist.  Repeat MRI to continue to follow these would be the best next step, and she should follow-up with her oncologist about this.  If she does not already have an upcoming appointment with them, she should discuss this with them.  They also have her MRI report as per their last notes.

## 2019-12-17 NOTE — Telephone Encounter (Signed)
Patient called back and I told her what Dr. Bonna Gains wanted me to inform her about-please read below.  Dr. Bonna Gains stated that she would call patient's Oncologist at [facility] to know exactly how they want to follow up on her MRI. Patient was informed.

## 2019-12-17 NOTE — Telephone Encounter (Signed)
The MRI at [facility] on Feb 8th 2021, was read by Dr. Gladstone Pih - [phone]. He was not available to speak today and I spoke to another radiologist, Dr. Aggie Hacker, who reviewed the images and states that these are tiny lesions that should be followed.  He compared it to previous imaging and stated that he they have not changed in 8 months compared to the previous study.  However, the lesions are indeterminant.  He suggests that a 107-monthfollow-up with repeat imaging would be reasonable.

## 2019-12-17 NOTE — Telephone Encounter (Signed)
Called patient back and she did not answer, therefore, I had to leave her another voicemail to call me back.

## 2019-12-17 NOTE — Telephone Encounter (Signed)
In reference to the previous telephone call about her MRI, patient sees hematology oncology, see care everywhere and had a visit with them in February 2021.  They are aware about her MRI results and she can continue to have further imaging with them.  She has an appointment with [place] in June 2021 as well

## 2019-12-18 ENCOUNTER — Other Ambulatory Visit: Payer: Self-pay | Admitting: Family Medicine

## 2019-12-18 DIAGNOSIS — I10 Essential (primary) hypertension: Secondary | ICD-10-CM

## 2019-12-18 MED ORDER — AMLODIPINE BESYLATE 5 MG PO TABS: 5.0000 mg | ORAL_TABLET | Freq: Every day | ORAL | 0 refills | Status: DC

## 2019-12-25 ENCOUNTER — Encounter: Payer: Self-pay | Admitting: Family Medicine

## 2019-12-25 ENCOUNTER — Other Ambulatory Visit: Payer: Self-pay | Admitting: Family Medicine

## 2019-12-25 ENCOUNTER — Other Ambulatory Visit: Payer: Self-pay

## 2019-12-25 ENCOUNTER — Ambulatory Visit (INDEPENDENT_AMBULATORY_CARE_PROVIDER_SITE_OTHER): Payer: Medicare HMO | Admitting: Family Medicine

## 2019-12-25 DIAGNOSIS — F339 Major depressive disorder, recurrent, unspecified: Secondary | ICD-10-CM

## 2019-12-25 DIAGNOSIS — I10 Essential (primary) hypertension: Secondary | ICD-10-CM

## 2019-12-25 DIAGNOSIS — E785 Hyperlipidemia, unspecified: Secondary | ICD-10-CM | POA: Diagnosis not present

## 2019-12-25 DIAGNOSIS — J9611 Chronic respiratory failure with hypoxia: Secondary | ICD-10-CM | POA: Diagnosis not present

## 2019-12-25 DIAGNOSIS — E781 Pure hyperglyceridemia: Secondary | ICD-10-CM

## 2019-12-25 DIAGNOSIS — E1169 Type 2 diabetes mellitus with other specified complication: Secondary | ICD-10-CM

## 2019-12-25 DIAGNOSIS — J432 Centrilobular emphysema: Secondary | ICD-10-CM

## 2019-12-25 DIAGNOSIS — R69 Illness, unspecified: Secondary | ICD-10-CM | POA: Diagnosis not present

## 2019-12-25 DIAGNOSIS — E213 Hyperparathyroidism, unspecified: Secondary | ICD-10-CM | POA: Diagnosis not present

## 2019-12-25 DIAGNOSIS — Z9981 Dependence on supplemental oxygen: Secondary | ICD-10-CM | POA: Diagnosis not present

## 2019-12-25 MED ORDER — DIVALPROEX SODIUM 250 MG PO DR TAB: 250.0000 mg | DELAYED_RELEASE_TABLET | Freq: Three times a day (TID) | ORAL | 2 refills | Status: DC

## 2019-12-25 MED ORDER — VALSARTAN 160 MG PO TABS: 160.0000 mg | ORAL_TABLET | Freq: Every day | ORAL | 0 refills | Status: DC

## 2019-12-25 MED ORDER — ARIPIPRAZOLE 10 MG PO TABS: 10.0000 mg | ORAL_TABLET | Freq: Every day | ORAL | 0 refills | Status: DC

## 2019-12-25 NOTE — Progress Notes (Signed)
Name: Judy Allen   MRN: [id number]    DOB: 1956/04/12   Date:12/25/2019       Progress Note  Subjective  Chief Complaint  Chief Complaint  Patient presents with  . Diabetes  . Hypertension  . Depression    I connected with  [name] on 12/25/19 at  1:20 PM EDT by telephone and verified that I am speaking with the correct person using two identifiers.  I discussed the limitations, risks, security and privacy concerns of performing an evaluation and management service by telephone and the availability of in person appointments. Staff also discussed with the patient that there may be a patient responsible charge related to this service. Patient Location: at home  Provider Location: [facility]   HPI  Hypertriglyceridemia: seeing Dr. Peter Congo is off Lovaza because of cost, tried Vascepa not covered, off gemfibrozil, we will try sending lovaza to [name]  HTN: she states bp has been spiking , she states usually high at night and first check of the morning. It has been in the 134-170/83-102. She states she has a slightly headaches and face feels flushed, hot and tingling when bp is high . She was on HCTZ but stopped back in 2020 because of hypercalcemia.  We will add Diovan today and monitor   MDD: she is now back on 225 mg of Effexor  Off Depakote and Wellbutrin because of high bp, and we added Abilify that has helped her mood however too costly. We will go back to Depakote but with TID dosing instead of just at night.  daily however stress is higher, her daughter is pregnant ( no longer dating the father of the baby), [name]'s middle daughter attempted suicide the day after . She is really worried about them. 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 [name] is pregnant and will deliver her baby in May. Her bp at home was high and we stopped Wellbutrin about one month ago, her bp is coming down in the  130's-140's, but still spiking. She states still gets emotional but feeling better.   Hyperparathyroidism:calciumhad goneback to normal since off hctz, however labs done at [facility] this past week showed elevation again,denies cramps, no kidney stone. She needs to have repeat labs in our office, she will return in one month for follow up  Emphysema and hypoxemia:on oxygendown to 2 liters - 24 hours per day sob is stable, no cough , but has intermittent wheezing ,  still taking Anoro  and albuterol prn.Under the care of pulmonologist at [facility]  History of anal cancer: still going to [facility] for follow up, every 6 months for radiation oncologist and every 6 months with oncologist in alternating fashion.She denies change in bowel movements of blood in stools , she was recently seen by Dr. Bonna Gains for elevation of liver enzymes , had multiple tests and advised to take Miralax for chronic constipation. She states since she started to take Miralax daily nausea has resolved, bowel movements daily or every other and Bristol scale of 4    DMII: diagnosed 02/2018 by Dr. Gabriel Carina, hgbA1C was at 6.6% she is nowon metformin, tolerating well, and last hgbA1C down to 6.3%back in March and down to 5.8 % September 2020She denies side effects of medication. She has noticed burning on both feet now. She did not come in for her labs. She also has dyslipidemia. She has not seen Dr. Gabriel Carina in  a while, explained the importance of follow up in person and labs.   Chronic pain: under the care of Dr. Tiburcio Bash states she is doing well on Tramadol,also taking muscle relaxer. Pain is worse on her neck, doing well , pain right now is 0/10 , no weakness .   Patient Active Problem List   Diagnosis Date Noted  . Hypoxia 08/25/2019  . Hyperparathyroidism ([place]) 08/25/2019  . Diabetes mellitus type 2 in obese ([place]) 11/06/2018  . Cervical radiculopathy (left) 08/31/2018  . Osteoarthritis of spine with radiculopathy, cervical  region 08/31/2018  . History of anal cancer 05/30/2018  . Myofascial pain syndrome, cervical 05/16/2018  . Chronic pain syndrome 05/16/2018  . Cervical facet joint syndrome 05/16/2018  . Perihepatitis ([place]) 09/27/2017  . Abnormal liver function test 09/27/2017  . Fatty liver 12/22/2016  . Chronic respiratory failure with hypoxia, on home oxygen therapy ([place]) 07/28/2016  . Centrilobular emphysema ([place]) 07/14/2016  . Abnormal Pap smear of cervix 04/14/2016  . Anal squamous cell carcinoma ([place]) 03/09/2016  . Metastatic squamous cell carcinoma ([place]) 02/25/2016  . Non-thrombocytopenic purpura ([place]) 01/12/2016  . Chronic constipation 10/16/2015  . History of fusion of cervical spine 04/02/2015  . Benign hypertension 01/16/2015  . Cervicalgia 01/16/2015  . CN (constipation) 01/16/2015  . Gastric reflux 01/16/2015  . Hypertriglyceridemia 01/16/2015  . Edema leg 01/16/2015  . Chronic recurrent major depressive disorder ([place]) 01/16/2015  . Dysmetabolic syndrome 36/64/4034  . Obstructive apnea 01/16/2015  . Psoriasis 01/16/2015  . Allergic rhinitis 01/16/2015  . Bursitis, trochanteric 01/16/2015  . GAD (generalized anxiety disorder) 01/16/2015  . Tachycardia, paroxysmal [facility])     Past Surgical History:  Procedure Laterality Date  . ANTERIOR FUSION LUMBAR SPINE  1990   plate in back, not metal  . CARDIAC CATHETERIZATION  2009?    [facility]  . CARDIAC CATHETERIZATION  05/2012   [facility]: Mild hypertension 37/12 with a mean pressure of 21 mm mercury  . CHOLECYSTECTOMY N/A 09/12/2017   Procedure: LAPAROSCOPIC CHOLECYSTECTOMY WITH INTRAOPERATIVE CHOLANGIOGRAM;  Surgeon: Robert Bellow, MD;  Location: [facility] ORS;  Service: General;  Laterality: N/A;  . COLONOSCOPY  2018  . herniated disc repair  2001  . LIVER BIOPSY N/A 09/12/2017   Procedure: LIVER BIOPSY;  Surgeon: Robert Bellow, MD;  Location: [facility] ORS;  Service: General;  Laterality: N/A;  . MASS EXCISION Left 02/23/2016   Procedure:  EXCISION MASS;  Surgeon: Christene Lye, MD;  Location: [facility] ORS;  Service: General;  Laterality: Left;  . NECK SURGERY    . PORT-A-CATH REMOVAL  2018  . PORTACATH PLACEMENT Left 03/12/2016   Procedure: INSERTION PORT-A-CATH;  Surgeon: Christene Lye, MD;  Location: [facility] ORS;  Service: General;  Laterality: Left;  . tonsillectomy  1959   history  . TRIGGER FINGER RELEASE Right 09/2018   Dr. Sabra Heck   . TUBAL LIGATION     s/p    Family History  Problem Relation Age of Onset  . Heart attack Mother   . Asthma Mother   . Liver cancer Father   . Breast cancer Maternal Aunt   . Bladder Cancer Neg Hx   . Kidney cancer Neg Hx   . Colon cancer Neg Hx     Social History   Socioeconomic History  . Marital status: Widowed    Spouse name: Automotive engineer  . Number of children: 3  . Years of education: Not on file  . Highest education level: 11th grade  Occupational History  .  Not on file  Tobacco Use  . Smoking status: Former Smoker    Packs/day: 1.00    Years: 32.00    Pack years: 32.00    Types: Cigarettes    Start date: 08/10/1963    Quit date: 01/15/2006    Years since quitting: 13.9  . Smokeless tobacco: Never Used  Substance and Sexual Activity  . Alcohol use: No    Alcohol/week: 0.0 standard drinks  . Drug use: No  . Sexual activity: Never    Birth control/protection: Post-menopausal  Other Topics Concern  . Not on file  Social History Narrative   Husband passed away on 2017/12/21   She was living with [name] for few years, but moved in with Candace her oldest daughter this past Summer.    Social Determinants of Health   Financial Resource Strain:   . Difficulty of Paying Living Expenses:   Food Insecurity:   . Worried About Charity fundraiser in the Last Year:   . Arboriculturist in the Last Year:   Transportation Needs:   . Film/video editor (Medical):   Marland Kitchen Lack of Transportation (Non-Medical):   Physical Activity:   . Days of Exercise per  Week:   . Minutes of Exercise per Session:   Stress: No Stress Concern Present  . Feeling of Stress : Not at all  Social Connections: Unknown  . Frequency of Communication with Friends and Family: Not on file  . Frequency of Social Gatherings with Friends and Family: More than three times a week  . Attends Religious Services: Not on file  . Active Member of Clubs or Organizations: Not on file  . Attends Archivist Meetings: Not on file  . Marital Status: Not on file  Intimate Partner Violence:   . Fear of Current or Ex-Partner:   . Emotionally Abused:   Marland Kitchen Physically Abused:   . Sexually Abused:      Current Outpatient Medications:  .  albuterol (PROVENTIL HFA;VENTOLIN HFA) 108 (90 Base) MCG/ACT inhaler, Inhale 2 puffs into the lungs every 6 (six) hours as needed for wheezing or shortness of breath., Disp: , Rfl:  .  amLODipine (NORVASC) 5 MG tablet, Take 1 tablet (5 mg total) by mouth daily., Disp: 30 tablet, Rfl: 0 .  ANORO ELLIPTA 62.5-25 MCG/INH AEPB, INL 1 PUFF PO D, Disp: , Rfl:  .  ARIPiprazole (ABILIFY) 5 MG tablet, Take 1 tablet (5 mg total) by mouth daily., Disp: 30 tablet, Rfl: 0 .  betamethasone dipropionate (DIPROLENE) 0.05 % cream, Apply 1 application topically 2 (two) times daily as needed. Avoid face , groin and axilla, Disp: 45 g, Rfl: 0 .  blood glucose meter kit and supplies, Dispense based on patient and insurance preference. Use up to four times daily as directed. (FOR ICD-10 E10.9, E11.9)., Disp: 1 each, Rfl: 0 .  CONTOUR NEXT TEST test strip, USE UP TO FOUR TIMES DAILY AS DIRECTED, Disp: 100 each, Rfl: 5 .  fluticasone (FLONASE) 50 MCG/ACT nasal spray, Place 2 sprays into both nostrils daily., Disp: 16 g, Rfl: 2 .  gabapentin (NEURONTIN) 300 MG capsule, TAKE 1 CAPSULE(300 MG) BY MOUTH THREE TIMES DAILY, Disp: 90 capsule, Rfl: 5 .  gemfibrozil (LOPID) 600 MG tablet, Take 600 mg by mouth 2 (two) times daily., Disp: , Rfl:  .  hydrocortisone 2.5 % cream,  Apply 1 application topically 2 (two) times daily., Disp: , Rfl:  .  ketoconazole (NIZORAL) 2 % cream,  Apply 1 application topically 2 (two) times daily., Disp: 60 g, Rfl: 0 .  loratadine (CLARITIN) 10 MG tablet, Take 1 tablet (10 mg total) by mouth daily., Disp: 90 tablet, Rfl: 1 .  metFORMIN (GLUCOPHAGE-XR) 500 MG 24 hr tablet, Take 2 tablets (1,000 mg total) by mouth daily., Disp: 180 tablet, Rfl: 1 .  metoprolol succinate (TOPROL-XL) 50 MG 24 hr tablet, Take 1 tablet (50 mg total) by mouth daily. Take with or immediately following a meal., Disp: 90 tablet, Rfl: 1 .  Microlet Lancets MISC, USE FOUR TIMES DAILY, Disp: 100 each, Rfl: 5 .  omega-3 acid ethyl esters (LOVAZA) 1 g capsule, Take 2 capsules (2 g total) by mouth 2 (two) times daily., Disp: 360 capsule, Rfl: 1 .  omeprazole (PRILOSEC) 40 MG capsule, Take 1 capsule (40 mg total) by mouth daily., Disp: 90 capsule, Rfl: 1 .  OXYGEN, Inhale 2 L into the lungs continuous. , Disp: , Rfl:  .  polyethylene glycol (MIRALAX) 17 g packet, Take 17 g by mouth daily., Disp: 30 packet, Rfl: 0 .  tiZANidine (ZANAFLEX) 4 MG tablet, Take 1 tablet (4 mg total) by mouth every 8 (eight) hours as needed., Disp: 90 tablet, Rfl: 5 .  traMADol (ULTRAM) 50 MG tablet, Take 1 tablet (50 mg total) by mouth every 6 (six) hours as needed for severe pain. Each fill must last 30 days, Disp: 120 tablet, Rfl: 5 .  triamcinolone cream (KENALOG) 0.1 %, Apply topically 2 (two) times daily., Disp: 30 g, Rfl: 0 .  venlafaxine XR (EFFEXOR-XR) 75 MG 24 hr capsule, Take 3 capsules (225 mg total) by mouth daily with breakfast., Disp: 90 capsule, Rfl: 2 .  Vitamin D, Ergocalciferol, (DRISDOL) 1.25 MG (50000 UT) CAPS capsule, Take 1 capsule (50,000 Units total) by mouth every 7 (seven) days., Disp: 12 capsule, Rfl: 1 .  glucose blood test strip, Contour Next Test Strips, Disp: , Rfl:   Allergies  Allergen Reactions  . Dapsone Other (See Comments)    Hypoxemia  .  Hydrochlorothiazide     History of hyperparathyroidism   . Sulfa Antibiotics Rash  . Sulfasalazine Rash    I personally reviewed active problem list, medication list, allergies, family history, social history, health maintenance with the patient/caregiver today.   ROS  Ten systems reviewed and is negative except as mentioned in HPI   Objective  Virtual encounter, vitals not obtained.  There is no height or weight on file to calculate BMI.  Physical Exam  Awake, alert and oriented  PHQ2/9: Depression screen [facility] 2/9 12/25/2019 12/04/2019 11/13/2019 11/02/2019 10/02/2019  Decreased Interest 3 2 0 0 3  Down, Depressed, Hopeless 3 3 0 0 3  PHQ - 2 Score 6 5 0 0 6  Altered sleeping 0 2 - 0 1  Tired, decreased energy 2 2 - 0 3  Change in appetite 0 2 - 0 1  Feeling bad or failure about yourself  2 3 - 0 3  Trouble concentrating 0 2 - 0 0  Moving slowly or fidgety/restless 0 3 - 0 0  Suicidal thoughts 0 0 - 0 0  PHQ-9 Score 10 19 - 0 14  Difficult doing work/chores Somewhat difficult Somewhat difficult - Not difficult at all -  Some recent data might be hidden   PHQ-2/9 Result is positive.    Fall Risk: Fall Risk  12/25/2019 11/13/2019 11/02/2019 08/21/2019 07/18/2019  Falls in the past year? 0 0 0 0 0  Number falls in  past yr: 0 - 0 0 0  Injury with Fall? 0 - 0 0 0  Follow up - - - - -    Assessment & Plan  1. Uncontrolled hypertension  - valsartan (DIOVAN) 160 MG tablet; Take 1 tablet (160 mg total) by mouth daily.  Dispense: 30 tablet; Refill: 0  2. Chronic recurrent major depressive disorder (HCC)  - divalproex (DEPAKOTE) 250 MG DR tablet; Take 1 tablet (250 mg total) by mouth 3 (three) times daily.  Dispense: 90 tablet; Refill: 2  3. Dyslipidemia associated with type 2 diabetes mellitus ([place])   4. Hyperparathyroidism ([place])  Recheck labs when she comes in person  5. Centrilobular emphysema (HCC)  Stable  7. Chronic respiratory failure with hypoxia, on home oxygen  therapy ([place])   I discussed the assessment and treatment plan with the patient. The patient was provided an opportunity to ask questions and all were answered. The patient agreed with the plan and demonstrated an understanding of the instructions.   The patient was advised to call back or seek an in-person evaluation if the symptoms worsen or if the condition fails to improve as anticipated.  I provided 25  minutes of non-face-to-face time during this encounter.  Loistine Chance, MD

## 2019-12-26 ENCOUNTER — Encounter: Payer: Self-pay | Admitting: Family Medicine

## 2019-12-28 ENCOUNTER — Other Ambulatory Visit: Payer: Self-pay | Admitting: Family Medicine

## 2019-12-28 DIAGNOSIS — F339 Major depressive disorder, recurrent, unspecified: Secondary | ICD-10-CM

## 2019-12-28 DIAGNOSIS — F411 Generalized anxiety disorder: Secondary | ICD-10-CM

## 2020-01-15 ENCOUNTER — Encounter: Payer: Self-pay | Admitting: Family Medicine

## 2020-01-16 DIAGNOSIS — M19011 Primary osteoarthritis, right shoulder: Secondary | ICD-10-CM | POA: Diagnosis not present

## 2020-01-16 DIAGNOSIS — M7541 Impingement syndrome of right shoulder: Secondary | ICD-10-CM | POA: Diagnosis not present

## 2020-01-16 DIAGNOSIS — M545 Low back pain: Secondary | ICD-10-CM | POA: Diagnosis not present

## 2020-01-16 DIAGNOSIS — G8929 Other chronic pain: Secondary | ICD-10-CM | POA: Diagnosis not present

## 2020-01-18 ENCOUNTER — Other Ambulatory Visit: Payer: Self-pay | Admitting: Family Medicine

## 2020-01-18 DIAGNOSIS — I10 Essential (primary) hypertension: Secondary | ICD-10-CM

## 2020-01-23 ENCOUNTER — Telehealth: Payer: Self-pay | Admitting: Family Medicine

## 2020-01-23 NOTE — Chronic Care Management (AMB) (Signed)
  Chronic Care Management   Note  01/23/2020 Name: Judy Allen MRN: [id number] DOB: 08/02/1956  Judy Allen is a 64 y.o. year old female who is a primary care patient of Steele Sizer, MD. I reached out to Judy Allen by phone today in response to a referral sent by Judy Allen [employer] health plan.     Judy Allen was given information about Chronic Care Management services today including:  1. CCM service includes personalized support from designated clinical staff supervised by her physician, including individualized plan of care and coordination with other care providers 2. 24/7 contact phone numbers for assistance for urgent and routine care needs. 3. Service will only be billed when office clinical staff spend 20 minutes or more in a month to coordinate care. 4. Only one practitioner may furnish and bill the service in a calendar month. 5. The patient may stop CCM services at any time (effective at the end of the month) by phone call to the office staff. 6. The patient will be responsible for cost sharing (co-pay) of up to 20% of the service fee (after annual deductible is met).  Patient agreed to services and verbal consent obtained.   Follow up plan: Telephone appointment with care management team member scheduled for: 02/22/2020  [facility]  [place], [place] [address] Direct Dial: [email or web address] Website: [email or web address]

## 2020-01-30 ENCOUNTER — Ambulatory Visit: Payer: Medicare HMO | Admitting: Family Medicine

## 2020-01-30 ENCOUNTER — Telehealth: Payer: Self-pay | Admitting: Gastroenterology

## 2020-01-30 NOTE — Telephone Encounter (Signed)
I have tried to called patient with no success on 01-30-20 @ 5:08pm (phone stated unable to complete at this time). I called her daughter([name] Linton Rump) asking her to have mom call the office. We need to r/s her appointment on 02-06-20 @ 1:45pm with Dr Bonna Gains. She will be on call.

## 2020-02-06 ENCOUNTER — Ambulatory Visit: Payer: Medicare HMO | Admitting: Gastroenterology

## 2020-02-13 ENCOUNTER — Other Ambulatory Visit: Payer: Self-pay | Admitting: Family Medicine

## 2020-02-13 DIAGNOSIS — I10 Essential (primary) hypertension: Secondary | ICD-10-CM

## 2020-02-14 ENCOUNTER — Other Ambulatory Visit: Payer: Self-pay | Admitting: Family Medicine

## 2020-02-14 DIAGNOSIS — F411 Generalized anxiety disorder: Secondary | ICD-10-CM

## 2020-02-14 DIAGNOSIS — F339 Major depressive disorder, recurrent, unspecified: Secondary | ICD-10-CM

## 2020-02-14 NOTE — Telephone Encounter (Signed)
Last visit was 12/25/19 tele-visit no B/P recorded. Approving 90 day supply at this time.

## 2020-02-19 ENCOUNTER — Ambulatory Visit: Payer: Medicare HMO | Admitting: Family Medicine

## 2020-02-19 ENCOUNTER — Other Ambulatory Visit: Payer: Self-pay

## 2020-02-21 ENCOUNTER — Encounter: Payer: Self-pay | Admitting: Family Medicine

## 2020-02-21 ENCOUNTER — Ambulatory Visit: Payer: Medicare HMO | Admitting: Gastroenterology

## 2020-02-22 ENCOUNTER — Telehealth: Payer: Medicare HMO

## 2020-02-22 ENCOUNTER — Ambulatory Visit: Payer: Self-pay

## 2020-02-22 NOTE — Chronic Care Management (AMB) (Signed)
  Chronic Care Management   Outreach Note  02/22/2020 Name: Judy Allen MRN: [id number] DOB: 05-03-1956  Primary Care Provider: Steele Sizer, MD Reason for referral : Chronic Care Management   An unsuccessful telephone outreach was attempted today. [name] was referred to the case management team for assistance with care management and care coordination.   Unable to leave a voice message due to voice mailbox not being set up.   Follow Up Plan: The care management team will reach out again within the next two to three weeks.   [facility] [phone]

## 2020-02-26 ENCOUNTER — Other Ambulatory Visit: Payer: Self-pay | Admitting: Family Medicine

## 2020-02-26 DIAGNOSIS — I10 Essential (primary) hypertension: Secondary | ICD-10-CM

## 2020-03-10 ENCOUNTER — Telehealth: Payer: Medicare HMO

## 2020-03-10 ENCOUNTER — Telehealth: Payer: Self-pay

## 2020-03-10 NOTE — Telephone Encounter (Signed)
°  Chronic Care Management   Outreach Note  03/10/2020 Name: Judy Allen MRN: [id number] DOB: [birth year]  Primary Care Provider: Steele Sizer, MD Reason for referral : Chronic Care Management   A second unsuccessful telephone outreach was attempted today. [name] was referred to the case management team for assistance with care management and care coordination.    PLAN The care management team will reach out to [name] again within the next two weeks.    [facility] [phone]

## 2020-03-10 NOTE — Telephone Encounter (Signed)
Erroneous Encounter. Please disregard.

## 2020-03-11 ENCOUNTER — Other Ambulatory Visit: Payer: Self-pay | Admitting: Family Medicine

## 2020-03-11 DIAGNOSIS — K219 Gastro-esophageal reflux disease without esophagitis: Secondary | ICD-10-CM

## 2020-03-11 DIAGNOSIS — I10 Essential (primary) hypertension: Secondary | ICD-10-CM

## 2020-03-11 DIAGNOSIS — E781 Pure hyperglyceridemia: Secondary | ICD-10-CM

## 2020-03-11 DIAGNOSIS — I479 Paroxysmal tachycardia, unspecified: Secondary | ICD-10-CM

## 2020-03-11 NOTE — Telephone Encounter (Signed)
Requested Prescriptions  Pending Prescriptions Disp Refills  . metoprolol succinate (TOPROL-XL) 50 MG 24 hr tablet [Pharmacy Med Name: METOPROLOL SUCC ER 50 MG] 90 tablet 0    Sig: TAKE 1 TABLET BY MOUTH DAILY WITH FOOD.     Cardiovascular:  Beta Blockers Failed - 03/11/2020 11:45 AM      Failed - Last BP in normal range    BP Readings from Last 1 Encounters:  12/04/19 (!) 170/104         Passed - Last Heart Rate in normal range    Pulse Readings from Last 1 Encounters:  12/04/19 79         Passed - Valid encounter within last 6 months    Recent Outpatient Visits          2 months ago Uncontrolled hypertension   [facility] Steele Sizer, MD   3 months ago Uncontrolled hypertension   [facility], Drue Stager, MD   4 months ago Right knee pain, unspecified chronicity   [facility] Delsa Grana, PA-C   5 months ago Hyperparathyroidism [facility])   [facility] Steele Sizer, MD   6 months ago Dyslipidemia associated with type 2 diabetes mellitus [facility])   [facility] Steele Sizer, MD      Future Appointments            In 1 week Steele Sizer, MD [facility] [place]   In 1 month Rubie Maid, MD [facility]

## 2020-03-12 ENCOUNTER — Other Ambulatory Visit: Payer: Self-pay | Admitting: Family Medicine

## 2020-03-12 DIAGNOSIS — Z08 Encounter for follow-up examination after completed treatment for malignant neoplasm: Secondary | ICD-10-CM | POA: Diagnosis not present

## 2020-03-12 DIAGNOSIS — F339 Major depressive disorder, recurrent, unspecified: Secondary | ICD-10-CM

## 2020-03-12 DIAGNOSIS — K649 Unspecified hemorrhoids: Secondary | ICD-10-CM | POA: Diagnosis not present

## 2020-03-12 DIAGNOSIS — R32 Unspecified urinary incontinence: Secondary | ICD-10-CM | POA: Diagnosis not present

## 2020-03-12 DIAGNOSIS — Z853 Personal history of malignant neoplasm of breast: Secondary | ICD-10-CM | POA: Diagnosis not present

## 2020-03-12 DIAGNOSIS — F411 Generalized anxiety disorder: Secondary | ICD-10-CM

## 2020-03-12 DIAGNOSIS — Z6832 Body mass index (BMI) 32.0-32.9, adult: Secondary | ICD-10-CM | POA: Diagnosis not present

## 2020-03-12 DIAGNOSIS — K625 Hemorrhage of anus and rectum: Secondary | ICD-10-CM | POA: Diagnosis not present

## 2020-03-12 DIAGNOSIS — C210 Malignant neoplasm of anus, unspecified: Secondary | ICD-10-CM | POA: Diagnosis not present

## 2020-03-19 ENCOUNTER — Telehealth (INDEPENDENT_AMBULATORY_CARE_PROVIDER_SITE_OTHER): Payer: Medicare HMO | Admitting: Family Medicine

## 2020-03-19 ENCOUNTER — Encounter: Payer: Self-pay | Admitting: Family Medicine

## 2020-03-19 ENCOUNTER — Other Ambulatory Visit: Payer: Self-pay | Admitting: Family Medicine

## 2020-03-19 DIAGNOSIS — I10 Essential (primary) hypertension: Secondary | ICD-10-CM

## 2020-03-19 DIAGNOSIS — K219 Gastro-esophageal reflux disease without esophagitis: Secondary | ICD-10-CM | POA: Diagnosis not present

## 2020-03-19 DIAGNOSIS — F411 Generalized anxiety disorder: Secondary | ICD-10-CM

## 2020-03-19 DIAGNOSIS — I479 Paroxysmal tachycardia, unspecified: Secondary | ICD-10-CM | POA: Diagnosis not present

## 2020-03-19 DIAGNOSIS — R69 Illness, unspecified: Secondary | ICD-10-CM | POA: Diagnosis not present

## 2020-03-19 DIAGNOSIS — E1169 Type 2 diabetes mellitus with other specified complication: Secondary | ICD-10-CM | POA: Diagnosis not present

## 2020-03-19 DIAGNOSIS — R2232 Localized swelling, mass and lump, left upper limb: Secondary | ICD-10-CM

## 2020-03-19 DIAGNOSIS — E785 Hyperlipidemia, unspecified: Secondary | ICD-10-CM | POA: Diagnosis not present

## 2020-03-19 DIAGNOSIS — F339 Major depressive disorder, recurrent, unspecified: Secondary | ICD-10-CM

## 2020-03-19 DIAGNOSIS — E781 Pure hyperglyceridemia: Secondary | ICD-10-CM | POA: Diagnosis not present

## 2020-03-19 MED ORDER — OMEGA-3-ACID ETHYL ESTERS 1 G PO CAPS: 2.0000 g | ORAL_CAPSULE | Freq: Two times a day (BID) | ORAL | 1 refills | Status: DC

## 2020-03-19 MED ORDER — VENLAFAXINE HCL ER 75 MG PO CP24: 225.0000 mg | ORAL_CAPSULE | Freq: Every day | ORAL | 1 refills | Status: DC

## 2020-03-19 MED ORDER — OMEPRAZOLE 40 MG PO CPDR: 40.0000 mg | DELAYED_RELEASE_CAPSULE | Freq: Every day | ORAL | 1 refills | Status: DC

## 2020-03-19 MED ORDER — ARIPIPRAZOLE 10 MG PO TABS: 10.0000 mg | ORAL_TABLET | Freq: Every day | ORAL | 1 refills | Status: DC

## 2020-03-19 MED ORDER — AMLODIPINE BESYLATE 2.5 MG PO TABS: 2.5000 mg | ORAL_TABLET | Freq: Every day | ORAL | 0 refills | Status: DC

## 2020-03-19 MED ORDER — ROSUVASTATIN CALCIUM 5 MG PO TABS: 5.0000 mg | ORAL_TABLET | Freq: Every day | ORAL | 1 refills | Status: DC

## 2020-03-19 MED ORDER — METOPROLOL SUCCINATE ER 50 MG PO TB24: 50.0000 mg | ORAL_TABLET | Freq: Every day | ORAL | 0 refills | Status: DC

## 2020-03-19 NOTE — Telephone Encounter (Signed)
Requested medication (s) are due for refill today: no  Requested medication (s) are on the active medication list: yes  Last refill:  05/22/2019  Future visit scheduled: yes  Notes to clinic:  medication filled by historical provider Review for refill   Requested Prescriptions  Pending Prescriptions Disp Refills   ANORO ELLIPTA 62.5-25 MCG/INH AEPB [Pharmacy Med Name: ANORO ELLIPTA 62.5-25 MCG INH] 60 each 0    Sig: INHALE 1 PUFF ONCE DAILY.      Pulmonology:  Combination Products Passed - 03/19/2020 10:28 AM      Passed - Valid encounter within last 12 months    Recent Outpatient Visits           2 months ago Uncontrolled hypertension   [facility] Steele Sizer, MD   3 months ago Uncontrolled hypertension   [facility], Drue Stager, MD   4 months ago Right knee pain, unspecified chronicity   [facility] Delsa Grana, PA-C   5 months ago Hyperparathyroidism [facility])   [facility] Steele Sizer, MD   7 months ago Dyslipidemia associated with type 2 diabetes mellitus [facility])   [facility] Steele Sizer, MD       Future Appointments             Today Steele Sizer, MD [facility] [place]   In 1 month Rubie Maid, MD [facility]

## 2020-03-19 NOTE — Progress Notes (Signed)
Name: Judy Allen   MRN: [id number]    DOB: 06/10/1956   Date:03/19/2020       Progress Note  Subjective  Chief Complaint  Chief Complaint  Patient presents with  . Depression  . Diabetes  . Hypertension  . Hyperlipidemia  . Hypothyroidism    I connected with  [name]  on 03/19/20 at 11:40 AM EDT by a video enabled telemedicine application and verified that I am speaking with the correct person using two identifiers.  I discussed the limitations of evaluation and management by telemedicine and the availability of in person appointments. The patient expressed understanding and agreed to proceed. Staff also discussed with the patient that there may be a patient responsible charge related to this service. Patient Location: home  Provider Location: [facility]    HPI  Hypertriglyceridemia: seeing Dr. Peter Congo is off Lovaza because of cost,tried Vascepa not covered, she is back on Gemfibrozil and is also back on lovaza now, she has insurance again   HTN: she states BP is still spiking at home, a couple of weeks ago it went up to 204/115 She states she has a slightly headaches and face feels flushed, hot and tingling when bp is high.She was on HCTZ but stopped back in 2020 because of hypercalcemia. She is supposed to be  taking Valsartan , amlodipine and metoprolol . We tried going up in the past on Norvasc dose but could not tolerate it because of edema. BP sometimes is at goal in the 130 range. She has an appointment with cardiologist in 9 days, we will not adjust medications, explained sometimes nephrologist is a better option for bp management to let me know what [facility] says. During visit today we found out she has not been taking Amlodipine 2.5 mg but does not have it at home, she thought she was supposed to have stopped it .   MDD: she is now back on 225 mg of Effexor  Off Depakote and Wellbutrin because of high bp,she is back on Abilify since has insurance again.  She is doing  better emotionally   Hyperparathyroidism:calciumhad goneback to normal since off hctz, however labs done at [facility] this past week showed elevation again,denies cramps, no kidney stone.She is going to see Cardiologist and Endocrinologist in the next couple of weeks.   Emphysema and hypoxemia:on oxygendown to 2liters - 24 hours per day sob is stable, no cough , but has intermittent wheezing , she always has SOB  still takingAnoroand albuterol prn.Under the care of pulmonologist at [facility], follow up this month   History of anal cancer: still going to [facility] yearly for radiation oncologist. She denies change in bowel movements of blood in stools, she was recently seen by Dr. Bonna Gains for elevation of liver enzymes , had multiple tests and advised to take Miralax for chronic constipation. She is taking Miralax prn only .   DMII: diagnosed 02/2018 by Dr. Gabriel Carina, hgbA1C was at 6.6% she is nowon metformin, tolerating well, and last hgbA1C was 5.8 % 08/25/2019 , has follow up next week with her. She denies side effects of medication. She has noticed burning on both feet now, also paresthesia .She also has dyslipidemia. We will stop gemfibrozil and continue Lovaza, add crestor 5 mg daily   Chronic pain: under the care of Dr. Tiburcio Bash states she is doing well on Gabapentin , Tramadol,also taking muscle relaxer. Pain is worse on her neck, doing well , pain right now is 0/10 , no weakness .  Mass left axilla: she states that over the past couple of months she has noticed a mass on left axilla, no pain, but it is getting larger.   Patient Active Problem List   Diagnosis Date Noted  . Hypoxia 08/25/2019  . Hyperparathyroidism ([place]) 08/25/2019  . Diabetes mellitus type 2 in obese ([place]) 11/06/2018  . Cervical radiculopathy (left) 08/31/2018  . Osteoarthritis of spine with radiculopathy, cervical region 08/31/2018  . History of anal cancer 05/30/2018  . Myofascial pain syndrome, cervical  05/16/2018  . Chronic pain syndrome 05/16/2018  . Cervical facet joint syndrome 05/16/2018  . Perihepatitis ([place]) 09/27/2017  . Abnormal liver function test 09/27/2017  . Fatty liver 12/22/2016  . Chronic respiratory failure with hypoxia, on home oxygen therapy ([place]) 07/28/2016  . Centrilobular emphysema ([place]) 07/14/2016  . Abnormal Pap smear of cervix 04/14/2016  . Anal squamous cell carcinoma ([place]) 03/09/2016  . Non-thrombocytopenic purpura ([place]) 01/12/2016  . Chronic constipation 10/16/2015  . History of fusion of cervical spine 04/02/2015  . Benign hypertension 01/16/2015  . Cervicalgia 01/16/2015  . CN (constipation) 01/16/2015  . Gastric reflux 01/16/2015  . Hypertriglyceridemia 01/16/2015  . Edema leg 01/16/2015  . Chronic recurrent major depressive disorder ([place]) 01/16/2015  . Dysmetabolic syndrome 55/73/2202  . Obstructive apnea 01/16/2015  . Psoriasis 01/16/2015  . Allergic rhinitis 01/16/2015  . Bursitis, trochanteric 01/16/2015  . GAD (generalized anxiety disorder) 01/16/2015  . Tachycardia, paroxysmal [facility])     Past Surgical History:  Procedure Laterality Date  . ANTERIOR FUSION LUMBAR SPINE  1990   plate in back, not metal  . CARDIAC CATHETERIZATION  2009?    [facility];[place]  . CARDIAC CATHETERIZATION  05/2012   [facility]: Mild hypertension 37/12 with a mean pressure of 21 mm mercury  . CHOLECYSTECTOMY N/A 09/12/2017   Procedure: LAPAROSCOPIC CHOLECYSTECTOMY WITH INTRAOPERATIVE CHOLANGIOGRAM;  Surgeon: Robert Bellow, MD;  Location: [facility] ORS;  Service: General;  Laterality: N/A;  . COLONOSCOPY  2018  . herniated disc repair  2001  . LIVER BIOPSY N/A 09/12/2017   Procedure: LIVER BIOPSY;  Surgeon: Robert Bellow, MD;  Location: [facility] ORS;  Service: General;  Laterality: N/A;  . MASS EXCISION Left 02/23/2016   Procedure: EXCISION MASS;  Surgeon: Christene Lye, MD;  Location: [facility] ORS;  Service: General;  Laterality: Left;  . NECK SURGERY    . PORT-A-CATH  REMOVAL  2018  . PORTACATH PLACEMENT Left 03/12/2016   Procedure: INSERTION PORT-A-CATH;  Surgeon: Christene Lye, MD;  Location: [facility] ORS;  Service: General;  Laterality: Left;  . tonsillectomy  1959   history  . TRIGGER FINGER RELEASE Right 09/2018   Dr. Sabra Heck   . TUBAL LIGATION     s/p    Family History  Problem Relation Age of Onset  . Heart attack Mother   . Asthma Mother   . Liver cancer Father   . Breast cancer Maternal Aunt   . Bladder Cancer Neg Hx   . Kidney cancer Neg Hx   . Colon cancer Neg Hx       Current Outpatient Medications:  .  albuterol (PROVENTIL HFA;VENTOLIN HFA) 108 (90 Base) MCG/ACT inhaler, Inhale 2 puffs into the lungs every 6 (six) hours as needed for wheezing or shortness of breath., Disp: , Rfl:  .  amLODipine (NORVASC) 2.5 MG tablet, Take 1 tablet (2.5 mg total) by mouth daily., Disp: 30 tablet, Rfl: 0 .  ANORO ELLIPTA 62.5-25 MCG/INH AEPB, INL 1 PUFF PO D, Disp: , Rfl:  .  ARIPiprazole (ABILIFY) 10 MG tablet, Take 1 tablet (10 mg total) by mouth daily., Disp: 90 tablet, Rfl: 1 .  betamethasone dipropionate (DIPROLENE) 0.05 % cream, Apply 1 application topically 2 (two) times daily as needed. Avoid face , groin and axilla, Disp: 45 g, Rfl: 0 .  blood glucose meter kit and supplies, Dispense based on patient and insurance preference. Use up to four times daily as directed. (FOR ICD-10 E10.9, E11.9)., Disp: 1 each, Rfl: 0 .  CONTOUR NEXT TEST test strip, USE UP TO FOUR TIMES DAILY AS DIRECTED, Disp: 100 each, Rfl: 5 .  fluticasone (FLONASE) 50 MCG/ACT nasal spray, Place 2 sprays into both nostrils daily., Disp: 16 g, Rfl: 2 .  gabapentin (NEURONTIN) 300 MG capsule, TAKE 1 CAPSULE(300 MG) BY MOUTH THREE TIMES DAILY, Disp: 90 capsule, Rfl: 5 .  hydrocortisone 2.5 % cream, Apply 1 application topically 2 (two) times daily., Disp: , Rfl:  .  ketoconazole (NIZORAL) 2 % cream, Apply 1 application topically 2 (two) times daily., Disp: 60 g, Rfl: 0 .   loratadine (CLARITIN) 10 MG tablet, Take 1 tablet (10 mg total) by mouth daily., Disp: 90 tablet, Rfl: 1 .  metFORMIN (GLUCOPHAGE-XR) 500 MG 24 hr tablet, Take 2 tablets (1,000 mg total) by mouth daily., Disp: 180 tablet, Rfl: 1 .  metoprolol succinate (TOPROL-XL) 50 MG 24 hr tablet, Take 1 tablet (50 mg total) by mouth daily. with food, Disp: 90 tablet, Rfl: 0 .  Microlet Lancets MISC, USE FOUR TIMES DAILY, Disp: 100 each, Rfl: 5 .  omega-3 acid ethyl esters (LOVAZA) 1 g capsule, Take 2 capsules (2 g total) by mouth 2 (two) times daily., Disp: 360 capsule, Rfl: 1 .  omeprazole (PRILOSEC) 40 MG capsule, Take 1 capsule (40 mg total) by mouth daily., Disp: 90 capsule, Rfl: 1 .  OXYGEN, Inhale 2 L into the lungs continuous. , Disp: , Rfl:  .  tiZANidine (ZANAFLEX) 4 MG tablet, Take 1 tablet (4 mg total) by mouth every 8 (eight) hours as needed., Disp: 90 tablet, Rfl: 5 .  traMADol (ULTRAM) 50 MG tablet, Take 1 tablet (50 mg total) by mouth every 6 (six) hours as needed for severe pain. Each fill must last 30 days, Disp: 120 tablet, Rfl: 5 .  triamcinolone cream (KENALOG) 0.1 %, Apply topically 2 (two) times daily., Disp: 30 g, Rfl: 0 .  valsartan (DIOVAN) 160 MG tablet, TAKE ONE (1) TABLET,BY MOUTH, ONCE DAILY., Disp: 90 tablet, Rfl: 0 .  venlafaxine XR (EFFEXOR-XR) 75 MG 24 hr capsule, Take 3 capsules (225 mg total) by mouth daily with breakfast., Disp: 90 capsule, Rfl: 1 .  Vitamin D, Ergocalciferol, (DRISDOL) 1.25 MG (50000 UT) CAPS capsule, Take 1 capsule (50,000 Units total) by mouth every 7 (seven) days., Disp: 12 capsule, Rfl: 1 .  rosuvastatin (CRESTOR) 5 MG tablet, Take 1 tablet (5 mg total) by mouth daily., Disp: 90 tablet, Rfl: 1  Allergies  Allergen Reactions  . Dapsone Other (See Comments)    Hypoxemia  . Hydrochlorothiazide     History of hyperparathyroidism   . Sulfa Antibiotics Rash  . Sulfasalazine Rash    I personally reviewed active problem list, medication list, allergies,  family history, social history, health maintenance with the patient/caregiver today.   ROS  Ten systems reviewed and is negative except as mentioned in HPI   Objective  Virtual encounter, vitals not obtained.  There is no height or weight on file to calculate BMI.  Physical Exam  Awake,  alert and oriented   PHQ2/9: Depression screen [facility] 2/9 03/19/2020 12/25/2019 12/04/2019 11/13/2019 11/02/2019  Decreased Interest 2 3 2  0 0  Down, Depressed, Hopeless 0 3 3 0 0  PHQ - 2 Score 2 6 5  0 0  Altered sleeping 0 0 2 - 0  Tired, decreased energy 3 2 2  - 0  Change in appetite 0 0 2 - 0  Feeling bad or failure about yourself  1 2 3  - 0  Trouble concentrating 0 0 2 - 0  Moving slowly or fidgety/restless 0 0 3 - 0  Suicidal thoughts 0 0 0 - 0  PHQ-9 Score 6 10 19  - 0  Difficult doing work/chores Not difficult at all Somewhat difficult Somewhat difficult - Not difficult at all  Some recent data might be hidden   PHQ-2/9 Result is positive.    Fall Risk: Fall Risk  03/19/2020 12/25/2019 11/13/2019 11/02/2019 08/21/2019  Falls in the past year? 0 0 0 0 0  Number falls in past yr: 0 0 - 0 0  Injury with Fall? 0 0 - 0 0  Follow up - - - - -     Assessment & Plan  1. Uncontrolled hypertension  - amLODipine (NORVASC) 2.5 MG tablet; Take 1 tablet (2.5 mg total) by mouth daily.  Dispense: 30 tablet; Refill: 0  2. Chronic recurrent major depressive disorder (HCC)  - ARIPiprazole (ABILIFY) 10 MG tablet; Take 1 tablet (10 mg total) by mouth daily.  Dispense: 90 tablet; Refill: 1 - venlafaxine XR (EFFEXOR-XR) 75 MG 24 hr capsule; Take 3 capsules (225 mg total) by mouth daily with breakfast.  Dispense: 90 capsule; Refill: 1  3. Dyslipidemia associated with type 2 diabetes mellitus (HCC)  - rosuvastatin (CRESTOR) 5 MG tablet; Take 1 tablet (5 mg total) by mouth daily.  Dispense: 90 tablet; Refill: 1  4. Benign hypertension  - metoprolol succinate (TOPROL-XL) 50 MG 24 hr tablet; Take 1 tablet (50  mg total) by mouth daily. with food  Dispense: 90 tablet; Refill: 0  5. Tachycardia, paroxysmal (HCC)  - metoprolol succinate (TOPROL-XL) 50 MG 24 hr tablet; Take 1 tablet (50 mg total) by mouth daily. with food  Dispense: 90 tablet; Refill: 0  6. Hypertriglyceridemia  - omega-3 acid ethyl esters (LOVAZA) 1 g capsule; Take 2 capsules (2 g total) by mouth 2 (two) times daily.  Dispense: 360 capsule; Refill: 1  7. GAD (generalized anxiety disorder)  - venlafaxine XR (EFFEXOR-XR) 75 MG 24 hr capsule; Take 3 capsules (225 mg total) by mouth daily with breakfast.  Dispense: 90 capsule; Refill: 1  8. GERD without esophagitis  - omeprazole (PRILOSEC) 40 MG capsule; Take 1 capsule (40 mg total) by mouth daily.  Dispense: 90 capsule; Refill: 1  9. Axillary mass, left  - [place] AXILLA LEFT; Future  I discussed the assessment and treatment plan with the patient. The patient was provided an opportunity to ask questions and all were answered. The patient agreed with the plan and demonstrated an understanding of the instructions.  The patient was advised to call back or seek an in-person evaluation if the symptoms worsen or if the condition fails to improve as anticipated.  I provided 25 minutes of non-face-to-face time during this encounter.

## 2020-03-19 NOTE — Telephone Encounter (Signed)
Copied from [place] [id number]. Topic: General - Call Back - No Documentation >> Mar 19, 2020  1:05 PM Jaynie Collins D wrote: Reason for CRM: Judy Allen states the RX request that was sent in for the patient has a drug interaction, so she needs to speak with PCP to proceed.   I called Judy Allen at [facility] and she said that there is a contraindication for the Crestor. It contraindicates with Lopid. It increases muscle weakness and muscle failure. She has been taking Lopid for awhile. Please advise.

## 2020-03-28 DIAGNOSIS — Z6832 Body mass index (BMI) 32.0-32.9, adult: Secondary | ICD-10-CM | POA: Diagnosis not present

## 2020-03-28 DIAGNOSIS — E213 Hyperparathyroidism, unspecified: Secondary | ICD-10-CM | POA: Diagnosis not present

## 2020-03-28 DIAGNOSIS — I10 Essential (primary) hypertension: Secondary | ICD-10-CM | POA: Diagnosis not present

## 2020-03-28 DIAGNOSIS — C210 Malignant neoplasm of anus, unspecified: Secondary | ICD-10-CM | POA: Diagnosis not present

## 2020-03-28 DIAGNOSIS — I509 Heart failure, unspecified: Secondary | ICD-10-CM | POA: Diagnosis not present

## 2020-03-30 ENCOUNTER — Encounter: Payer: Self-pay | Admitting: Family Medicine

## 2020-04-07 DIAGNOSIS — H353133 Nonexudative age-related macular degeneration, bilateral, advanced atrophic without subfoveal involvement: Secondary | ICD-10-CM | POA: Diagnosis not present

## 2020-04-07 DIAGNOSIS — E1039 Type 1 diabetes mellitus with other diabetic ophthalmic complication: Secondary | ICD-10-CM | POA: Diagnosis not present

## 2020-04-08 DIAGNOSIS — H5211 Myopia, right eye: Secondary | ICD-10-CM | POA: Diagnosis not present

## 2020-04-09 ENCOUNTER — Ambulatory Visit: Payer: Self-pay

## 2020-04-09 NOTE — Chronic Care Management (AMB) (Signed)
  Chronic Care Management   Outreach Note  04/09/2020 Name: Judy Allen MRN: [id number] DOB: April 11, 1956  Primary Care Provider: Steele Sizer, MD Reason for referral : Chronic Care Management    Judy Allen was referred to the care management team for assistance with chronic care management and care coordination. Her primary care provider will be notified of our unsuccessful attempts to establish and maintain contact. The care management team will gladly outreach at any time in the future if she is interested in receiving assistance.    PLAN The care management team will gladly follow up with Judy Allen after the primary care provider has a conversation with her regarding recommendation for care management engagement and subsequent re-referral for care management services.    [facility]y [facility] Care Management [facility] [phone]

## 2020-05-01 ENCOUNTER — Other Ambulatory Visit: Payer: Self-pay | Admitting: Family Medicine

## 2020-05-01 DIAGNOSIS — I10 Essential (primary) hypertension: Secondary | ICD-10-CM

## 2020-05-02 ENCOUNTER — Encounter: Payer: Self-pay | Admitting: Obstetrics and Gynecology

## 2020-05-05 DIAGNOSIS — E559 Vitamin D deficiency, unspecified: Secondary | ICD-10-CM | POA: Diagnosis not present

## 2020-05-05 DIAGNOSIS — R7303 Prediabetes: Secondary | ICD-10-CM | POA: Diagnosis not present

## 2020-05-05 DIAGNOSIS — E213 Hyperparathyroidism, unspecified: Secondary | ICD-10-CM | POA: Diagnosis not present

## 2020-05-05 DIAGNOSIS — M8588 Other specified disorders of bone density and structure, other site: Secondary | ICD-10-CM | POA: Diagnosis not present

## 2020-05-05 DIAGNOSIS — M8589 Other specified disorders of bone density and structure, multiple sites: Secondary | ICD-10-CM | POA: Diagnosis not present

## 2020-05-05 DIAGNOSIS — E785 Hyperlipidemia, unspecified: Secondary | ICD-10-CM | POA: Diagnosis not present

## 2020-05-05 LAB — LIPID PANEL
Cholesterol: 85 (ref 0–200)
HDL: 27 — AB (ref 35–70)
LDl/HDL Ratio: 3.1
Triglycerides: 246 — AB (ref 40–160)

## 2020-05-05 LAB — TSH
TSH: 2.27 (ref <=5.90)
TSH: 27 — AB (ref <=5.90)

## 2020-05-12 ENCOUNTER — Telehealth: Payer: Self-pay | Admitting: Family Medicine

## 2020-05-12 DIAGNOSIS — M8589 Other specified disorders of bone density and structure, multiple sites: Secondary | ICD-10-CM | POA: Diagnosis not present

## 2020-05-12 DIAGNOSIS — R2232 Localized swelling, mass and lump, left upper limb: Secondary | ICD-10-CM | POA: Diagnosis not present

## 2020-05-12 DIAGNOSIS — R590 Localized enlarged lymph nodes: Secondary | ICD-10-CM | POA: Diagnosis not present

## 2020-05-12 DIAGNOSIS — E213 Hyperparathyroidism, unspecified: Secondary | ICD-10-CM | POA: Diagnosis not present

## 2020-05-12 DIAGNOSIS — E559 Vitamin D deficiency, unspecified: Secondary | ICD-10-CM | POA: Diagnosis not present

## 2020-05-12 DIAGNOSIS — R922 Inconclusive mammogram: Secondary | ICD-10-CM | POA: Diagnosis not present

## 2020-05-12 NOTE — Telephone Encounter (Signed)
Copied from [place] [id number]. Topic: Medicare AWV >> May 12, 2020 10:49 AM Cher Nakai R wrote: Reason for CRM:   No answer unable to leave message for patient to call back and schedule Medicare Annual Wellness Visit (AWV) in office or virtually.  No hx of AWV eligible as of 05/09/2020  Please schedule at anytime with [place].      40 Minutes appointment   Any questions, please call me at [phone]

## 2020-05-13 ENCOUNTER — Ambulatory Visit
Payer: Medicare HMO | Attending: Student in an Organized Health Care Education/Training Program | Admitting: Student in an Organized Health Care Education/Training Program

## 2020-05-13 ENCOUNTER — Other Ambulatory Visit: Payer: Self-pay

## 2020-05-13 ENCOUNTER — Encounter: Payer: Self-pay | Admitting: Student in an Organized Health Care Education/Training Program

## 2020-05-13 VITALS — BP 97/65 | HR 87 | Temp 97.1°F | Resp 20 | Ht 64.0 in | Wt 183.0 lb

## 2020-05-13 DIAGNOSIS — G894 Chronic pain syndrome: Secondary | ICD-10-CM | POA: Diagnosis not present

## 2020-05-13 DIAGNOSIS — G8929 Other chronic pain: Secondary | ICD-10-CM | POA: Diagnosis not present

## 2020-05-13 DIAGNOSIS — Z9889 Other specified postprocedural states: Secondary | ICD-10-CM | POA: Diagnosis not present

## 2020-05-13 DIAGNOSIS — M5136 Other intervertebral disc degeneration, lumbar region: Secondary | ICD-10-CM | POA: Diagnosis not present

## 2020-05-13 DIAGNOSIS — M542 Cervicalgia: Secondary | ICD-10-CM | POA: Diagnosis not present

## 2020-05-13 DIAGNOSIS — M5416 Radiculopathy, lumbar region: Secondary | ICD-10-CM | POA: Diagnosis not present

## 2020-05-13 MED ORDER — TRAMADOL HCL 50 MG PO TABS: 50.0000 mg | ORAL_TABLET | Freq: Four times a day (QID) | ORAL | 5 refills | Status: DC | PRN

## 2020-05-13 MED ORDER — TIZANIDINE HCL 4 MG PO TABS: 4.0000 mg | ORAL_TABLET | Freq: Three times a day (TID) | ORAL | 5 refills | Status: DC | PRN

## 2020-05-13 MED ORDER — GABAPENTIN 300 MG PO CAPS: ORAL_CAPSULE | ORAL | 5 refills | Status: DC

## 2020-05-13 NOTE — Progress Notes (Signed)
PROVIDER NOTE: Information contained herein reflects review and annotations entered in association with encounter. Interpretation of such information and data should be left to medically-trained personnel. Information provided to patient can be located elsewhere in the medical record under "Patient Instructions". Document created using STT-dictation technology, any transcriptional errors that may result from process are unintentional.    Patient: Judy Allen  Service Category: E/M  Provider: Gillis Santa, MD  DOB: 03-Aug-1956  DOS: 05/13/2020  Specialty: Interventional Pain Management  MRN: [id number]  Setting: Ambulatory outpatient  PCP: Steele Sizer, MD  Type: Established Patient    Referring Provider: Steele Sizer, MD  Location: Office  Delivery: Face-to-face     HPI  Judy Allen, a 64 y.o. year old female, is here today because of her Lumbar radiculopathy [M54.16]. Judy Allen primary complain today is Neck Pain Last encounter: My last encounter with her was on 11/15/2019. Pertinent problems: Judy Allen has Chronic recurrent major depressive disorder ([place]); GAD (generalized anxiety disorder); Myofascial pain syndrome, cervical; Chronic pain syndrome; Cervical facet joint syndrome; Cervical radiculopathy (left); and Osteoarthritis of spine with radiculopathy, cervical region on their pertinent problem list. Pain Assessment: Severity of Chronic pain is reported as a 1 /10. Location: Neck Left/radiates down left arm to fingertips. Onset: More than a month ago. Quality: Burning, Sharp, Stabbing. Timing: Intermittent. Modifying factor(s): pain medication. Vitals:  height is _0  (1.626 m) and weight is 183 lb (83 kg). Her temperature is 97.1 F (36.2 C) (abnormal). Her blood pressure is 97/65 and her pulse is 87. Her respiration is 20 and oxygen saturation is 91%.   Reason for encounter: medication management.   No change in medical history since last visit.  Patient's pain is at baseline.   Patient continues multimodal pain regimen as prescribed.  States that it provides pain relief and improvement in functional status.   Pharmacotherapy Assessment   04/18/2020  1   11/15/2019  Tramadol Hcl 50 MG Tablet  120.00  30 Bi Lat   [id number]   Cli ([id number])   5/5  20.00 MME  Private Pay   [place]     Analgesic: Tramadol 50 mg 4 times daily as needed, quantity 120/month; MME equals 20    Monitoring: [place] PMP: PDMP reviewed during this encounter.       Pharmacotherapy: No side-effects or adverse reactions reported. Compliance: No problems identified. Effectiveness: Clinically acceptable.  Dewayne Shorter, RN  05/13/2020 10:29 AM  Sign when Signing Visit Nursing Pain Medication Assessment:  Safety precautions to be maintained throughout the outpatient stay will include: orient to surroundings, keep bed in low position, maintain call bell within reach at all times, provide assistance with transfer out of bed and ambulation.  Medication Inspection Compliance: Pill count conducted under aseptic conditions, in front of the patient. Neither the pills nor the bottle was removed from the patient's sight at any time. Once count was completed pills were immediately returned to the patient in their original bottle.  Medication: Tramadol (Ultram) Pill/Patch Count: 31 of 120 pills remain Pill/Patch Appearance: Markings consistent with prescribed medication Bottle Appearance: Standard pharmacy container. Clearly labeled. Filled Date: 09/ 10/ 2021 Last Medication intake:  Today    UDS:  Summary  Date Value Ref Range Status  11/15/2019 Note  Final    Comment:    ==================================================================== Tox[employer] Select 13 (MW) ==================================================================== Test  Result       Flag       Units Drug Present and Declared for Prescription Verification   Tramadol                       >8197        EXPECTED   ng/mg  creat   O-Desmethyltramadol            >8197        EXPECTED   ng/mg creat   N-Desmethyltramadol            7887         EXPECTED   ng/mg creat    Source of tramadol is a prescription medication. O-desmethyltramadol    and N-desmethyltramadol are expected metabolites of tramadol. ==================================================================== Test                      Result    Flag   Units      Ref Range   Creatinine              61               mg/dL      >=20 ==================================================================== Declared Medications:  The flagging and interpretation on this report are based on the  following declared medications.  Unexpected results may arise from  inaccuracies in the declared medications.  **Note: The testing scope of this panel includes these medications:  Tramadol  **Note: The testing scope of this panel does not include the  following reported medications:  Albuterol  Bupropion (Wellbutrin)  Divaleproex (Depakote)  Fluticasone (Flonase)  Gabapentin (Neurontin)  Gemfibrozil (Lopid)  Hydrocortisone  Loratadine (Claritin)  Losartan  Meloxicam  Metformin  Metoprolol  Mirabegron  Omega-3 Fatty Acids  Omeprazole  Oxygen  Prednisone  Tizanidine  Topical  Umeclidinium (Anoro)  Venlafaxine (Effexor)  Vilanterol (Anoro)  Vitamin D2 (Drisdol) ==================================================================== For clinical consultation, please call [phone]. ====================================================================      ROS  Constitutional: Denies any fever or chills Gastrointestinal: No reported hemesis, hematochezia, vomiting, or acute GI distress Musculoskeletal: Bilateral neck, bilateral shoulder pain Neurological: No reported episodes of acute onset apraxia, aphasia, dysarthria, agnosia, amnesia, paralysis, loss of coordination, or loss of consciousness  Medication Review  ARIPiprazole, Microlet Lancets,  Oxygen-Helium, Vitamin D (Ergocalciferol), albuterol, amLODipine, betamethasone dipropionate, blood glucose meter kit and supplies, fluticasone, gabapentin, gemfibrozil, glucose blood, ketoconazole, metFORMIN, metoprolol succinate, omega-3 acid ethyl esters, omeprazole, rosuvastatin, telmisartan, tiZANidine, traMADol, triamcinolone cream, umeclidinium-vilanterol, and venlafaxine XR  History Review  Allergy: Judy Allen is allergic to dapsone, hydrochlorothiazide, sulfa antibiotics, and sulfasalazine. Drug: Judy Allen  reports no history of drug use. Alcohol:  reports no history of alcohol use. Tobacco:  reports that she quit smoking about 14 years ago. Her smoking use included cigarettes. She started smoking about 56 years ago. She has a 32.00 pack-year smoking history. She has never used smokeless tobacco. Social: Judy Allen  reports that she quit smoking about 14 years ago. Her smoking use included cigarettes. She started smoking about 56 years ago. She has a 32.00 pack-year smoking history. She has never used smokeless tobacco. She reports that she does not drink alcohol and does not use drugs. Medical:  has a past medical history of Anemia, Anxiety, Arthritis, Colon cancer ([place]) (8502), Complication of anesthesia, Constipation, COPD (chronic obstructive pulmonary disease) ([place]), Depression, Diabetes mellitus without complication ([place]), Dysrhythmia, GERD (gastroesophageal reflux disease), H/O allergic rhinitis, Heart murmur, Hemorrhoid, Hyperlipidemia,  Hypertension, Neck pain, chronic, Neuritis, Ovarian failure, Personal history of chemotherapy, Personal history of radiation therapy, PONV (postoperative nausea and vomiting), Pre-diabetes (2019), Proteinuria, Psoriasis, Sleep apnea (09/12/2016), Tachycardia, paroxysmal ([place]), and Urinary incontinence. Surgical: Judy Allen  has a past surgical history that includes Tubal ligation; tonsillectomy (1959); herniated disc repair (2001); Anterior fusion lumbar spine  (1990); Neck surgery; Mass excision (Left, 02/23/2016); Colonoscopy (2018); Portacath placement (Left, 03/12/2016); Cardiac catheterization (2009? ); Cardiac catheterization (05/2012); Port-a-cath removal (2018); Liver biopsy (N/A, 09/12/2017); Cholecystectomy (N/A, 09/12/2017); and Trigger finger release (Right, 09/2018). Family: family history includes Asthma in her mother; Breast cancer in her maternal aunt; Heart attack in her mother; Liver cancer in her father.  Laboratory Chemistry Profile   Renal Lab Results  Component Value Date   BUN 18 04/23/2019   CREATININE 0.93 04/23/2019   LABCREA 128 77/06/6578   BCR NOT APPLICABLE 03/83/3383   GFRAA 75 04/23/2019   GFRNONAA 65 04/23/2019     Hepatic Lab Results  Component Value Date   AST 37 12/12/2019   ALT 40 (H) 12/12/2019   ALBUMIN 5.2 (H) 12/12/2019   ALKPHOS 70 12/12/2019   LIPASE 27 08/03/2017     Electrolytes Lab Results  Component Value Date   NA 145 04/23/2019   K 4.7 04/23/2019   CL 104 04/23/2019   CALCIUM 10.6 (H) 04/23/2019     Bone Lab Results  Component Value Date   VD25OH 14 (L) 04/23/2019     Inflammation (CRP: Acute Phase) (ESR: Chronic Phase) No results found for: CRP, ESRSEDRATE, LATICACIDVEN     Note: Above Lab results reviewed.  Recent Imaging Review  DG HIP UNILAT W OR W/O PELVIS 2-3 VIEWS LEFT CLINICAL DATA:  Pain in both hips  EXAM: DG HIP (WITH OR WITHOUT PELVIS) 2-3V LEFT  COMPARISON:  Right hip performed today  FINDINGS: Hip joints and SI joints symmetric and unremarkable. No acute bony abnormality. Specifically, no fracture, subluxation, or dislocation.  IMPRESSION: No acute bony abnormality.  Electronically Signed   By: Rolm Baptise M.D.   On: 11/13/2019 17:59 DG HIP UNILAT W OR W/O PELVIS 2-3 VIEWS RIGHT CLINICAL DATA:  Bilateral hip pain  EXAM: DG HIP (WITH OR WITHOUT PELVIS) 2-3V RIGHT  COMPARISON:  Left hip performed today  FINDINGS: Hip joints and SI joints are  symmetric and unremarkable. No acute bony abnormality. Specifically, no fracture, subluxation, or dislocation.  IMPRESSION: No acute bony abnormality.  Electronically Signed   By: Rolm Baptise M.D.   On: 11/13/2019 17:59 DG Si Joints CLINICAL DATA:  BILATERAL hip pain  EXAM: BILATERAL SACROILIAC JOINTS - 3+ VIEW  COMPARISON:  12/13/2014  FINDINGS: Osseous mineralization low normal.  Mild degenerative changes of the hip joints bilaterally again identified.  No fracture, dislocation or bone destruction.  Symmetric sacral foramina.  Degenerative disc and facet disease changes at visualized lower lumbar spine.  IMPRESSION: Mild degenerative changes of the SI joints bilaterally.  Degenerative disc and facet disease changes at visualized lower lumbar spine.  Electronically Signed   By: Lavonia Dana M.D.   On: 11/13/2019 16:24 Note: Reviewed        Physical Exam  General appearance: Well nourished, well developed, and well hydrated. In no apparent acute distress Mental status: Alert, oriented x 3 (person, place, & time)       Respiratory: Oxygen-dependent COPD Eyes: PERLA Vitals: BP 97/65   Pulse 87   Temp (!) 97.1 F (36.2 C)   Resp 20   Ht 5'  4" (1.626 m)   Wt 183 lb (83 kg)   LMP 03/04/2002 (Approximate)   SpO2 91%   BMI 31.41 kg/m  BMI: Estimated body mass index is 31.41 kg/m as calculated from the following:   Height as of this encounter: _0  (1.626 m).   Weight as of this encounter: 183 lb (83 kg). Ideal: Ideal body weight: 54.7 kg (120 lb 9.5 oz) Adjusted ideal body weight: 66 kg (145 lb 8.9 oz)    Cervical Spine Exam  Skin & Axial Inspection: No masses, redness, edema, swelling, or associated skin lesions Alignment: Symmetrical Functional ROM: Pain restricted ROM, to the left Stability: No instability detected Muscle Tone/Strength: Functionally intact. No obvious neuro-muscular anomalies detected. Sensory (Neurological): Dermatomal pain  pattern Palpation: No palpable anomalies                    Upper Extremity (UE) Exam    Side: Right upper extremity  Side: Left upper extremity   Skin & Extremity Inspection: Skin color, temperature, and hair growth are WNL. No peripheral edema or cyanosis. No masses, redness, swelling, asymmetry, or associated skin lesions. No contractures.  Skin & Extremity Inspection: Skin color, temperature, and hair growth are WNL. No peripheral edema or cyanosis. No masses, redness, swelling, asymmetry, or associated skin lesions. No contractures.   Functional ROM: Unrestricted ROM          Functional ROM: Decreased ROM for shoulder and elbow   Muscle Tone/Strength: Functionally intact. No obvious neuro-muscular anomalies detected.  Muscle Tone/Strength: Functionally intact. No obvious neuro-muscular anomalies detected.   Sensory (Neurological): Unimpaired          Sensory (Neurological): Dermatomal pain pattern           Palpation: No palpable anomalies              Palpation: No palpable anomalies               Provocative Test(s):  Phalen's test: deferred Tinel's test: deferred Apley's scratch test (touch opposite shoulder):  Action 1 (Across chest): deferred Action 2 (Overhead): deferred Action 3 (LB reach): deferred   Provocative Test(s):  Phalen's test: deferred Tinel's test: deferred Apley's scratch test (touch opposite shoulder):  Action 1 (Across chest): Decreased ROM Action 2 (Overhead): Decreased ROM Action 3 (LB reach): Decreased ROM     Thoracic Spine Area Exam  Skin & Axial Inspection: No masses, redness, or swelling Alignment: Symmetrical Functional ROM: Unrestricted ROM Stability: No instability detected Muscle Tone/Strength: Functionally intact. No obvious neuro-muscular anomalies detected. Sensory (Neurological): Unimpaired Muscle strength & Tone: No palpable anomalies  Lumbar Exam  Skin & Axial Inspection: No masses, redness, or swelling Alignment:  Symmetrical Functional ROM: Unrestricted ROM       Stability: No instability detected Muscle Tone/Strength: Functionally intact. No obvious neuro-muscular anomalies detected. Sensory (Neurological): Unimpaired Palpation: No palpable anomalies       Provocative Tests: Hyperextension/rotation test: deferred today       Lumbar quadrant test (Kemp's test): deferred today       Lateral bending test: deferred today       Patrick's Maneuver: deferred today                   FABER* test: (+) for bilateral S-I arthralgia             S-I anterior distraction/compression test: Unchanged         S-I lateral compression test: deferred today  S-I Thigh-thrust test: deferred today         S-I Gaenslen's test: deferred today         *(Flexion, ABduction and External Rotation)  Gait & Posture Assessment  Ambulation: Unassisted Gait: Relatively normal for age and body habitus Posture: WNL   Lower Extremity Exam    Side: Right lower extremity  Side: Left lower extremity  Stability: No instability observed          Stability: No instability observed          Skin & Extremity Inspection: Skin color, temperature, and hair growth are WNL. No peripheral edema or cyanosis. No masses, redness, swelling, asymmetry, or associated skin lesions. No contractures.  Skin & Extremity Inspection: Skin color, temperature, and hair growth are WNL. No peripheral edema or cyanosis. No masses, redness, swelling, asymmetry, or associated skin lesions. No contractures.  Functional ROM: Unrestricted ROM                  Functional ROM: Unrestricted ROM                  Muscle Tone/Strength: Functionally intact. No obvious neuro-muscular anomalies detected.  Muscle Tone/Strength: Functionally intact. No obvious neuro-muscular anomalies detected.  Sensory (Neurological): Unimpaired        Sensory (Neurological): Unimpaired        DTR: Patellar: deferred today Achilles: deferred today Plantar: deferred today   DTR: Patellar: deferred today Achilles: deferred today Plantar: deferred today  Palpation: No palpable anomalies  Palpation: No palpable anomalies     Assessment   Status Diagnosis  Controlled Controlled Controlled 1. Lumbar radiculopathy   2. Chronic radicular lumbar pain   3. Chronic pain syndrome   4. Chronic cervical pain   5. Other intervertebral disc degeneration, lumbar region   6. History of lumbar surgery      Updated Problems: Problem  Cervical radiculopathy (left)  Osteoarthritis of Spine With Radiculopathy, Cervical Region  Myofascial Pain Syndrome, Cervical  Chronic Pain Syndrome  Cervical Facet Joint Syndrome  Chronic recurrent major depressive disorder (HCC)  Gad (Generalized Anxiety Disorder)    Plan of Care   [name] has a current medication list which includes the following long-term medication(s): amlodipine, aripiprazole, fluticasone, gabapentin, metformin, metoprolol succinate, omega-3 acid ethyl esters, omeprazole, rosuvastatin, telmisartan, [START ON 05/17/2020] tramadol, and venlafaxine xr.  Pharmacotherapy (Medications Ordered): Meds ordered this encounter  Medications  . traMADol (ULTRAM) 50 MG tablet    Sig: Take 1 tablet (50 mg total) by mouth every 6 (six) hours as needed for severe pain. Each fill must last 30 days    Dispense:  120 tablet    Refill:  5    [place] STOP ACT - Not applicable. Fill one day early if pharmacy is closed on scheduled refill date.  . gabapentin (NEURONTIN) 300 MG capsule    Sig: TAKE 1 CAPSULE(300 MG) BY MOUTH THREE TIMES DAILY    Dispense:  90 capsule    Refill:  5  . tiZANidine (ZANAFLEX) 4 MG tablet    Sig: Take 1 tablet (4 mg total) by mouth every 8 (eight) hours as needed.    Dispense:  90 tablet    Refill:  5   Orders:  Orders Placed This Encounter  Procedures  . MR LUMBAR SPINE WO CONTRAST    Patient presents with axial pain with possible radicular component.  In addition to any acute  findings, please report on:  1. Facet (Zygapophyseal) joint  DJD (Hypertrophy, space narrowing, subchondral sclerosis, and/or osteophyte formation) 2. DDD and/or IVDD (Loss of disc height, desiccation or "Black disc disease") 3. Pars defects 4. Spondylolisthesis, spondylosis, and/or spondyloarthropathies (include Degree/Grade of displacement in mm) 5. Vertebral body Fractures, including age (old, new/acute) 65. Modic Type Changes 7. Demineralization 8. Bone pathology 9. Central, Lateral Recess, and/or Foraminal Stenosis (include AP diameter of stenosis in mm) 10. Surgical changes (hardware type, status, and presence of fibrosis)  NOTE: Please specify level(s) and laterality.    Standing Status:   Future    Standing Expiration Date:   08/13/2020    Order Specific Question:   What is the patient's sedation requirement?    Answer:   No Sedation    Order Specific Question:   Does the patient have a pacemaker or implanted devices?    Answer:   No    Order Specific Question:   Preferred imaging location?    Answer:   [facility] (table limit-350lbs)    Order Specific Question:   Call Results- Best Contact Number?    Answer:   [phone] ([facility])    Order Specific Question:   Radiology Contrast Protocol - do NOT remove file path    Answer:   [email or web address]  . ToxASSURE Select 13 (MW), Urine    Volume: 30 ml(s). Minimum 3 ml of urine is needed. Document temperature of fresh sample. Indications: Long term (current) use of opiate analgesic [phone])    Order Specific Question:   Release to patient    Answer:   Immediate   Follow-up plan:   Return in about 6 months (around 11/11/2020) for Medication Management, in person.    Recent Visits No visits were found meeting these conditions. Showing recent visits within past 90 days and meeting all other requirements Today's Visits Date Type Provider Dept  05/13/20 Office Visit Gillis Santa, MD  [facility]  Showing today's visits and meeting all other requirements Future Appointments No visits were found meeting these conditions. Showing future appointments within next 90 days and meeting all other requirements  I discussed the assessment and treatment plan with the patient. The patient was provided an opportunity to ask questions and all were answered. The patient agreed with the plan and demonstrated an understanding of the instructions.  Patient advised to call back or seek an in-person evaluation if the symptoms or condition worsens.  Duration of encounter: 30 minutes.  Note by: Gillis Santa, MD Date: 05/13/2020; Time: 1:24 PM

## 2020-05-13 NOTE — Progress Notes (Signed)
Nursing Pain Medication Assessment:  Safety precautions to be maintained throughout the outpatient stay will include: orient to surroundings, keep bed in low position, maintain call bell within reach at all times, provide assistance with transfer out of bed and ambulation.  Medication Inspection Compliance: Pill count conducted under aseptic conditions, in front of the patient. Neither the pills nor the bottle was removed from the patient's sight at any time. Once count was completed pills were immediately returned to the patient in their original bottle.  Medication: Tramadol (Ultram) Pill/Patch Count: 31 of 120 pills remain Pill/Patch Appearance: Markings consistent with prescribed medication Bottle Appearance: Standard pharmacy container. Clearly labeled. Filled Date: 09/ 10/ 2021 Last Medication intake:  Today

## 2020-05-14 ENCOUNTER — Telehealth: Payer: Self-pay | Admitting: *Deleted

## 2020-05-15 ENCOUNTER — Encounter: Payer: Medicare HMO | Admitting: Student in an Organized Health Care Education/Training Program

## 2020-05-15 DIAGNOSIS — Z9981 Dependence on supplemental oxygen: Secondary | ICD-10-CM | POA: Diagnosis not present

## 2020-05-15 DIAGNOSIS — I361 Nonrheumatic tricuspid (valve) insufficiency: Secondary | ICD-10-CM | POA: Diagnosis not present

## 2020-05-15 DIAGNOSIS — J9611 Chronic respiratory failure with hypoxia: Secondary | ICD-10-CM | POA: Diagnosis not present

## 2020-05-15 DIAGNOSIS — J9621 Acute and chronic respiratory failure with hypoxia: Secondary | ICD-10-CM | POA: Diagnosis not present

## 2020-05-16 DIAGNOSIS — I10 Essential (primary) hypertension: Secondary | ICD-10-CM | POA: Diagnosis not present

## 2020-05-16 LAB — TOXASSURE SELECT 13 (MW), URINE

## 2020-05-28 ENCOUNTER — Other Ambulatory Visit: Payer: Self-pay | Admitting: Family Medicine

## 2020-05-28 DIAGNOSIS — I10 Essential (primary) hypertension: Secondary | ICD-10-CM

## 2020-06-02 ENCOUNTER — Ambulatory Visit: Payer: Medicare HMO

## 2020-06-03 ENCOUNTER — Other Ambulatory Visit: Payer: Self-pay | Admitting: Family Medicine

## 2020-06-03 DIAGNOSIS — Z1231 Encounter for screening mammogram for malignant neoplasm of breast: Secondary | ICD-10-CM

## 2020-06-09 DIAGNOSIS — I270 Primary pulmonary hypertension: Secondary | ICD-10-CM | POA: Diagnosis not present

## 2020-06-09 DIAGNOSIS — R0902 Hypoxemia: Secondary | ICD-10-CM | POA: Diagnosis not present

## 2020-06-09 DIAGNOSIS — C211 Malignant neoplasm of anal canal: Secondary | ICD-10-CM | POA: Diagnosis not present

## 2020-06-13 DIAGNOSIS — I10 Essential (primary) hypertension: Secondary | ICD-10-CM | POA: Diagnosis not present

## 2020-06-16 DIAGNOSIS — I10 Essential (primary) hypertension: Secondary | ICD-10-CM | POA: Diagnosis not present

## 2020-06-17 NOTE — Progress Notes (Signed)
Name: Judy [facility]   MRN: [id number]    DOB: 15-Jul-1956   Date:06/19/2020       Progress Note  Subjective  Chief Complaint  Follow up   HPI    Hypertriglyceridemia: seeing Dr. Maylon Peppers last labs done in her office. Currently taking Crestor , Lovaza and Gemfibrozil   HTN/Paroxysmal tachycardia: she states BP still goes up and down. She is currently taking Norvasc 2.5 , Telmisartan 80, Metoprolol 50 mg , she is under the care of Dr. Smith Mince - nephrologist at [facility] and currently talking to clinical pharmacist - she has a consult with him today. . We tried going up in the past on Norvasc dose but could not tolerate it because of edema. She states over the past few weeks she has noticed increase in lower extremity edema, gets worse at the end of the day, she denies orthopnea or increase in sob. No chest pain or palpitation - heart rate has been controlled with metoprolol   MDD: she is now back on 225 mg of Effexor  Off Depakote and Wellbutrin because of high bp,she is back on Abilify 24m and states her mood has been stable. She is living with Candace.   Hyperparathyroidism:calciumhad goneback to normal since off hctz. Monitored by nephrologist and endocrinologist.   Emphysema and hypoxemia:on oxygendown to 2liters - 24 hours per day sob is stable, no cough , but has intermittent wheezing. .[facility] and [facility] as recommended   [facility] of [facility] cancer[facility] She denies change in bowel movements of blood in stools, she was recently seen by Dr. TBonna Gainsfor elevation of liver enzymes , had multiple tests and since imaging stable she was advised to keep follow up with oncologist . [facility]   DMII: diagnosed 02/2018 by Dr. SGabriel Carina hgbA1C was at 6.6% she is nowon metformin, tolerating well, and last hgbA1C was 5.8 % 08/25/2019, we will recheck it today.  She denies side effects of medication. She has diabetes neuropathy - pain on both feet  .She also has dyslipidemia.   Chronic pain: under the care of Dr. LTiburcio Bashstates she is doing well on Gabapentin , Tramadol,also taking muscle relaxer. Pain is worse on her neck, but also has back pain, history of laminectomy, reviewed MRI done today with patient and she has follow up with Dr. LHolley Raring  Senile purpura: she states not as bad now, still bruises on both arms. Reassurance given   Psoriasis: used to see Dr GPhillip Heal but asked me to refill her medication, she has noticed increase in lesions on her face  Patient Active Problem List   Diagnosis Date Noted  . Hypoxia 08/25/2019  . Hyperparathyroidism (HEsch[place] 08/25/2019  . Diabetes mellitus type 2 in obese (HK[place] 11/06/2018  . Cervical radiculopathy (left) 08/31/2018  . Osteoarthritis of spine with radiculopathy, cervical region 08/31/2018  . History of anal cancer 05/30/2018  . Myofascial pain syndrome, cervical 05/16/2018  . Chronic pain syndrome 05/16/2018  . Cervical facet joint syndrome 05/16/2018  . Perihepatitis ([place] 09/27/2017  . Abnormal liver function test 09/27/2017  . Fatty liver 12/22/2016  . Chronic respiratory failure with hypoxia, on home oxygen therapy (HM[place] 07/28/2016  . Centrilobular emphysema (HVan 07/14/2016  . Abnormal Pap smear of cervix 04/14/2016  . Anal squamous cell carcinoma ([place] 03/09/2016  . Non-thrombocytopenic purpura ([place] 01/12/2016  . Chronic constipation 10/16/2015  . History  of fusion of cervical spine 04/02/2015  . Benign hypertension 01/16/2015  . Cervicalgia 01/16/2015  . CN (constipation) 01/16/2015  . Gastric reflux 01/16/2015  . Hypertriglyceridemia 01/16/2015  . Edema leg 01/16/2015  . Chronic recurrent major depressive disorder ([place]) 01/16/2015  . Dysmetabolic syndrome 10/28/2246  . Obstructive apnea 01/16/2015  . Psoriasis 01/16/2015  . Allergic rhinitis 01/16/2015  . Bursitis,  trochanteric 01/16/2015  . GAD (generalized anxiety disorder) 01/16/2015  . Tachycardia, paroxysmal [facility])     Past Surgical History:  Procedure Laterality Date  . ANTERIOR FUSION LUMBAR SPINE  1990   plate in back, not metal  . CARDIAC CATHETERIZATION  2009?    [facility];[place]  . CARDIAC CATHETERIZATION  05/2012   [facility]: Mild hypertension 37/12 with a mean pressure of 21 mm mercury  . CHOLECYSTECTOMY N/A 09/12/2017   Procedure: LAPAROSCOPIC CHOLECYSTECTOMY WITH INTRAOPERATIVE CHOLANGIOGRAM;  Surgeon: Robert Bellow, MD;  Location: [facility] ORS;  Service: General;  Laterality: N/A;  . COLONOSCOPY  2018  . herniated disc repair  2001  . LIVER BIOPSY N/A 09/12/2017   Procedure: LIVER BIOPSY;  Surgeon: Robert Bellow, MD;  Location: [facility] ORS;  Service: General;  Laterality: N/A;  . MASS EXCISION Left 02/23/2016   Procedure: EXCISION MASS;  Surgeon: Christene Lye, MD;  Location: [facility] ORS;  Service: General;  Laterality: Left;  . NECK SURGERY    . PORT-A-CATH REMOVAL  2018  . PORTACATH PLACEMENT Left 03/12/2016   Procedure: INSERTION PORT-A-CATH;  Surgeon: Christene Lye, MD;  Location: [facility] ORS;  Service: General;  Laterality: Left;  . tonsillectomy  1959   history  . TRIGGER FINGER RELEASE Right 09/2018   Dr. Sabra Heck   . TUBAL LIGATION     s/p    Family History  Problem Relation Age of Onset  . Heart attack Mother   . Asthma Mother   . Liver cancer Father   . Breast cancer Maternal Aunt   . Bladder Cancer Neg Hx   . Kidney cancer Neg Hx   . Colon cancer Neg Hx     Social History   Tobacco Use  . Smoking status: Former Smoker    Packs/day: 1.00    Years: 32.00    Pack years: 32.00    Types: Cigarettes    Start date: 08/10/1963    Quit date: 01/15/2006    Years since quitting: 14.4  . Smokeless tobacco: Never Used  Substance Use Topics  . Alcohol use: No    Alcohol/week: 0.0 standard drinks     Current Outpatient Medications:  .  albuterol (PROVENTIL  HFA;VENTOLIN HFA) 108 (90 Base) MCG/ACT inhaler, Inhale 2 puffs into the lungs every 6 (six) hours as needed for wheezing or shortness of breath., Disp: , Rfl:  .  amLODipine (NORVASC) 2.5 MG tablet, Take 1 tablet (2.5 mg total) by mouth daily., Disp: 90 tablet, Rfl: 0 .  ANORO ELLIPTA 62.5-25 MCG/INH AEPB, INL 1 PUFF PO D, Disp: , Rfl:  .  ARIPiprazole (ABILIFY) 10 MG tablet, Take 1 tablet (10 mg total) by mouth daily., Disp: 90 tablet, Rfl: 0 .  betamethasone dipropionate (DIPROLENE) 0.05 % cream, Apply 1 application topically 2 (two) times daily as needed. Avoid face , groin and axilla, Disp: 45 g, Rfl: 0 .  blood glucose meter kit and supplies, Dispense based on patient and insurance preference. Use up to four times daily as directed. (FOR ICD-10 E10.9, E11.9)., Disp: 1 each, Rfl: 0 .  CONTOUR NEXT TEST test strip,  USE UP TO FOUR TIMES DAILY AS DIRECTED, Disp: 100 each, Rfl: 5 .  fluticasone (FLONASE) 50 MCG/ACT nasal spray, Place 2 sprays into both nostrils daily., Disp: 16 g, Rfl: 2 .  gabapentin (NEURONTIN) 300 MG capsule, TAKE 1 CAPSULE(300 MG) BY MOUTH THREE TIMES DAILY, Disp: 90 capsule, Rfl: 5 .  gemfibrozil (LOPID) 600 MG tablet, Take 600 mg by mouth 2 (two) times daily., Disp: , Rfl:  .  icosapent Ethyl (VASCEPA) 1 g capsule, Take by mouth., Disp: , Rfl:  .  ketoconazole (NIZORAL) 2 % cream, Apply 1 application topically 2 (two) times daily., Disp: 60 g, Rfl: 0 .  metFORMIN (GLUCOPHAGE-XR) 500 MG 24 hr tablet, Take 2 tablets (1,000 mg total) by mouth daily., Disp: 180 tablet, Rfl: 1 .  metoprolol succinate (TOPROL-XL) 50 MG 24 hr tablet, Take 1 tablet (50 mg total) by mouth daily. with food, Disp: 90 tablet, Rfl: 0 .  Microlet Lancets MISC, USE FOUR TIMES DAILY, Disp: 100 each, Rfl: 5 .  omega-3 acid ethyl esters (LOVAZA) 1 g capsule, Take 2 capsules (2 g total) by mouth 2 (two) times daily., Disp: 360 capsule, Rfl: 1 .  omeprazole (PRILOSEC) 40 MG capsule, Take 1 capsule (40 mg total)  by mouth daily., Disp: 90 capsule, Rfl: 1 .  OXYGEN, Inhale 2 L into the lungs continuous. , Disp: , Rfl:  .  rosuvastatin (CRESTOR) 5 MG tablet, Take 1 tablet (5 mg total) by mouth daily., Disp: 90 tablet, Rfl: 0 .  telmisartan (MICARDIS) 80 MG tablet, Take 80 mg by mouth daily., Disp: , Rfl:  .  tiZANidine (ZANAFLEX) 4 MG tablet, Take 1 tablet (4 mg total) by mouth every 8 (eight) hours as needed., Disp: 90 tablet, Rfl: 5 .  traMADol (ULTRAM) 50 MG tablet, Take 1 tablet (50 mg total) by mouth every 6 (six) hours as needed for severe pain. Each fill must last 30 days, Disp: 120 tablet, Rfl: 5 .  triamcinolone cream (KENALOG) 0.1 %, Apply topically 2 (two) times daily., Disp: 30 g, Rfl: 0 .  venlafaxine XR (EFFEXOR-XR) 75 MG 24 hr capsule, Take 3 capsules (225 mg total) by mouth daily with breakfast., Disp: 270 capsule, Rfl: 0 .  Vitamin D, Ergocalciferol, (DRISDOL) 1.25 MG (50000 UT) CAPS capsule, Take 1 capsule (50,000 Units total) by mouth every 7 (seven) days., Disp: 12 capsule, Rfl: 1 .  hydrocortisone 2.5 % cream, Apply topically 2 (two) times daily., Disp: 30 g, Rfl: 0  Allergies  Allergen Reactions  . Dapsone Other (See Comments)    Hypoxemia  . Hydrochlorothiazide     History of hyperparathyroidism   . Sulfa Antibiotics Rash  . Sulfasalazine Rash    I personally reviewed active problem list, medication list, allergies, family history, social history, health maintenance with the patient/caregiver today.   ROS  Constitutional: Negative for fever or weight change.  Respiratory: Negative for cough but has chronic shortness of breath.   Cardiovascular: Negative for chest pain or palpitations.  Gastrointestinal: Negative for abdominal pain, no bowel changes.  Musculoskeletal: Negative for gait problem or joint swelling.  Skin: Negative for rash.  Neurological: Negative for dizziness or headache.  No other specific complaints in a complete review of systems (except as listed in HPI  above).  Objective  Vitals:   06/19/20 0845  BP: 140/90  Pulse: 90  Resp: 17  Temp: 98.3 F (36.8 C)  TempSrc: Oral  SpO2: 95%  Weight: 188 lb 14.4 oz (85.7 kg)  Height:   5' 4" (1.626 m)    Body mass index is 32.42 kg/m.  Physical Exam  Constitutional: Patient appears well-developed and well-nourished. Obese No distress.  HEENT: head atraumatic, normocephalic, pupils equal and reactive to light,  neck supple Cardiovascular: Normal rate, regular rhythm and normal heart sounds.  No murmur heard. 1 plus  BLE edema - feet , advised compression stocking hoses Skin: psoriatic plaques arm and face Pulmonary/Chest: Effort normal and breath sounds normal. No respiratory distress, using nasal canula oxygen . Abdominal: Soft.  There is no tenderness. Psychiatric: Patient has a normal mood and affect. behavior is normal. Judgment and thought content normal.  Recent Results (from the past 2160 hour(s))  TSH     Status: None   Collection Time: 05/05/20 12:00 AM  Result Value Ref Range   TSH 2.27 0.41 - 5.90  Lipid panel     Status: Abnormal   Collection Time: 05/05/20 12:00 AM  Result Value Ref Range   LDl/HDL Ratio 3.1    Triglycerides 246 (A) 40 - 160   Cholesterol 85 0 - 200   HDL 27 (A) 35 - 70  TSH     Status: Abnormal   Collection Time: 05/05/20 12:00 AM  Result Value Ref Range   TSH 27.00 (A) 0.41 - 5.90  ToxASSURE Select 13 (MW), Urine     Status: None   Collection Time: 05/13/20 10:50 AM  Result Value Ref Range   Summary Note     Comment: ==================================================================== ToxASSURE Select 13 (MW) ==================================================================== Test                             Result       Flag       Units  Drug Present   Tramadol                       >4762                   ng/mg creat   O-Desmethyltramadol            >4762                   ng/mg creat   N-Desmethyltramadol            2534                     ng/mg creat    Source of tramadol is a prescription medication. O-desmethyltramadol    and N-desmethyltramadol are expected metabolites of tramadol.  ==================================================================== Test                      Result    Flag   Units      Ref Range   Creatinine              105              mg/dL      >=20 ==================================================================== Declared Medications:  Medication list was not provided. ==================================================================== For clini cal consultation, please call [phone]. ====================================================================   TSH     Status: None   Collection Time: 06/18/20 12:00 AM  Result Value Ref Range   TSH 2.27 0.41 - 5.90    Comment: [facility]    Diabetic Foot Exam: Diabetic Foot Exam - Simple   Simple Foot Form Diabetic Foot exam was performed with the following findings:  Yes 06/19/2020 10:00 AM  Visual Inspection See comments: Yes Sensation Testing See comments: Yes Pulse Check Posterior Tibialis and Dorsalis pulse intact bilaterally: Yes Comments Decrease in sensation on both great toes Callus formation       PHQ2/9: Depression screen PHQ 2/9 06/19/2020 06/19/2020 05/13/2020 03/19/2020 12/25/2019  Decreased Interest 3 3 0 2 3  Down, Depressed, Hopeless 3 - 0 0 3  PHQ - 2 Score 6 3 0 2 6  Altered sleeping 0 - - 0 0  Tired, decreased energy 2 - - 3 2  Change in appetite 0 - - 0 0  Feeling bad or failure about yourself  3 - - 1 2  Trouble concentrating 0 - - 0 0  Moving slowly or fidgety/restless 0 - - 0 0  Suicidal thoughts 0 - - 0 0  PHQ-9 Score 11 - - 6 10  Difficult doing work/chores - - - Not difficult at [place] Somewhat difficult  Some recent data might be hidden    phq 9 is positive   Fall Risk: Fall Risk  06/19/2020 05/13/2020 03/19/2020 12/25/2019 11/13/2019  Falls in the past year? 0 0 0 0 0  Number falls in past yr: 0 - 0 0 -   Injury with Fall? 0 - 0 0 -  Follow up - - - - -     Functional Status Survey: Is the patient deaf or have difficulty hearing?: No Does the patient have difficulty seeing, even when wearing glasses/contacts?: No Does the patient have difficulty concentrating, remembering, or making decisions?: No Does the patient have difficulty walking or climbing stairs?: Yes Does the patient have difficulty dressing or bathing?: Yes Does the patient have difficulty doing errands alone such as visiting a doctor's office or shopping?: Yes   Assessment & Plan  1. Dyslipidemia associated with type 2 diabetes mellitus (HCC)  - icosapent Ethyl (VASCEPA) 1 g capsule; Take by mouth. - rosuvastatin (CRESTOR) 5 MG tablet; Take 1 tablet (5 mg total) by mouth daily.  Dispense: 90 tablet; Refill: 0 - POCT HgB A1C  2. Screening for osteoporosis  Abstracting results from [facility]  3. Chronic recurrent major depressive disorder (HCC)  - ARIPiprazole (ABILIFY) 10 MG tablet; Take 1 tablet (10 mg total) by mouth daily.  Dispense: 90 tablet; Refill: 0 - venlafaxine XR (EFFEXOR-XR) 75 MG 24 hr capsule; Take 3 capsules (225 mg total) by mouth daily with breakfast.  Dispense: 270 capsule; Refill: 0  4. GAD (generalized anxiety disorder)  - venlafaxine XR (EFFEXOR-XR) 75 MG 24 hr capsule; Take 3 capsules (225 mg total) by mouth daily with breakfast.  Dispense: 270 capsule; Refill: 0  5. Benign hypertension  - amLODipine (NORVASC) 2.5 MG tablet; Take 1 tablet (2.5 mg total) by mouth daily.  Dispense: 90 tablet; Refill: 0 - metoprolol succinate (TOPROL-XL) 50 MG 24 hr tablet; Take 1 tablet (50 mg total) by mouth daily. with food  Dispense: 90 tablet; Refill: 0  6. Tachycardia, paroxysmal (HCC)  - metoprolol succinate (TOPROL-XL) 50 MG 24 hr tablet; Take 1 tablet (50 mg total) by mouth daily. with food  Dispense: 90 tablet; Refill: 0  7. Need for vaccination for Strep pneumoniae  - Pneumococcal conjugate vaccine  13-valent IM  8. Chronic respiratory failure with hypoxia, on home oxygen therapy (HCC)  On oxygen sees pulmonologist at [facility]  9. Hyperparathyroidism (HCC)  Monitored by Dr. Solum   10. Centrilobular emphysema (HCC)  Stable  11. Senile purpura (HCC)  Stable  12. Chronic   cervical pain  Under the care of Dr. Holley Raring   13. Chronic neck pain   14. Psoriasis  - hydrocortisone 2.5 % cream; Apply topically 2 (two) times daily.  Dispense: 30 g; Refill: 0

## 2020-06-18 LAB — TSH: TSH: 2.27 (ref <=5.90)

## 2020-06-19 ENCOUNTER — Encounter: Payer: Self-pay | Admitting: Family Medicine

## 2020-06-19 ENCOUNTER — Ambulatory Visit
Admission: RE | Admit: 2020-06-19 | Discharge: 2020-06-19 | Disposition: A | Discharge status: Home / Self Care | Payer: Medicare HMO | Source: Ambulatory Visit | Attending: Student in an Organized Health Care Education/Training Program | Admitting: Student in an Organized Health Care Education/Training Program

## 2020-06-19 ENCOUNTER — Ambulatory Visit (INDEPENDENT_AMBULATORY_CARE_PROVIDER_SITE_OTHER): Payer: Medicare HMO | Admitting: Family Medicine

## 2020-06-19 ENCOUNTER — Other Ambulatory Visit: Payer: Self-pay

## 2020-06-19 VITALS — BP 140/90 | HR 90 | Temp 98.3°F | Resp 17 | Ht 64.0 in | Wt 188.9 lb

## 2020-06-19 DIAGNOSIS — R69 Illness, unspecified: Secondary | ICD-10-CM | POA: Diagnosis not present

## 2020-06-19 DIAGNOSIS — J9611 Chronic respiratory failure with hypoxia: Secondary | ICD-10-CM

## 2020-06-19 DIAGNOSIS — M542 Cervicalgia: Secondary | ICD-10-CM

## 2020-06-19 DIAGNOSIS — E213 Hyperparathyroidism, unspecified: Secondary | ICD-10-CM | POA: Diagnosis not present

## 2020-06-19 DIAGNOSIS — M5136 Other intervertebral disc degeneration, lumbar region: Secondary | ICD-10-CM | POA: Diagnosis not present

## 2020-06-19 DIAGNOSIS — F339 Major depressive disorder, recurrent, unspecified: Secondary | ICD-10-CM

## 2020-06-19 DIAGNOSIS — Z23 Encounter for immunization: Secondary | ICD-10-CM

## 2020-06-19 DIAGNOSIS — E1169 Type 2 diabetes mellitus with other specified complication: Secondary | ICD-10-CM

## 2020-06-19 DIAGNOSIS — M5117 Intervertebral disc disorders with radiculopathy, lumbosacral region: Secondary | ICD-10-CM | POA: Diagnosis not present

## 2020-06-19 DIAGNOSIS — Z13820 Encounter for screening for osteoporosis: Secondary | ICD-10-CM | POA: Diagnosis not present

## 2020-06-19 DIAGNOSIS — I479 Paroxysmal tachycardia, unspecified: Secondary | ICD-10-CM | POA: Diagnosis not present

## 2020-06-19 DIAGNOSIS — I10 Essential (primary) hypertension: Secondary | ICD-10-CM

## 2020-06-19 DIAGNOSIS — F411 Generalized anxiety disorder: Secondary | ICD-10-CM

## 2020-06-19 DIAGNOSIS — J432 Centrilobular emphysema: Secondary | ICD-10-CM

## 2020-06-19 DIAGNOSIS — E785 Hyperlipidemia, unspecified: Secondary | ICD-10-CM | POA: Diagnosis not present

## 2020-06-19 DIAGNOSIS — L409 Psoriasis, unspecified: Secondary | ICD-10-CM

## 2020-06-19 DIAGNOSIS — M5116 Intervertebral disc disorders with radiculopathy, lumbar region: Secondary | ICD-10-CM | POA: Diagnosis not present

## 2020-06-19 DIAGNOSIS — Z9981 Dependence on supplemental oxygen: Secondary | ICD-10-CM

## 2020-06-19 DIAGNOSIS — M48061 Spinal stenosis, lumbar region without neurogenic claudication: Secondary | ICD-10-CM | POA: Diagnosis not present

## 2020-06-19 DIAGNOSIS — G8929 Other chronic pain: Secondary | ICD-10-CM

## 2020-06-19 DIAGNOSIS — D692 Other nonthrombocytopenic purpura: Secondary | ICD-10-CM

## 2020-06-19 DIAGNOSIS — M4726 Other spondylosis with radiculopathy, lumbar region: Secondary | ICD-10-CM | POA: Diagnosis not present

## 2020-06-19 LAB — POCT GLYCOSYLATED HEMOGLOBIN (HGB A1C): Hemoglobin A1C: 5.9 % — AB (ref 4.0–5.6)

## 2020-06-19 MED ORDER — ARIPIPRAZOLE 10 MG PO TABS: 10.0000 mg | ORAL_TABLET | Freq: Every day | ORAL | 0 refills | Status: DC

## 2020-06-19 MED ORDER — ROSUVASTATIN CALCIUM 5 MG PO TABS: 5.0000 mg | ORAL_TABLET | Freq: Every day | ORAL | 0 refills | Status: DC

## 2020-06-19 MED ORDER — METOPROLOL SUCCINATE ER 50 MG PO TB24: 50.0000 mg | ORAL_TABLET | Freq: Every day | ORAL | 0 refills | Status: DC

## 2020-06-19 MED ORDER — AMLODIPINE BESYLATE 2.5 MG PO TABS: 2.5000 mg | ORAL_TABLET | Freq: Every day | ORAL | 0 refills | Status: DC

## 2020-06-19 MED ORDER — HYDROCORTISONE 2.5 % EX CREA: TOPICAL_CREAM | Freq: Two times a day (BID) | CUTANEOUS | 0 refills | Status: DC

## 2020-06-19 MED ORDER — VENLAFAXINE HCL ER 75 MG PO CP24: 225.0000 mg | ORAL_CAPSULE | Freq: Every day | ORAL | 0 refills | Status: DC

## 2020-06-19 NOTE — Patient Instructions (Signed)
HCTZ caused elevation of calcium levels Consider lasix or demadex instead

## 2020-07-09 DIAGNOSIS — C211 Malignant neoplasm of anal canal: Secondary | ICD-10-CM | POA: Diagnosis not present

## 2020-07-09 DIAGNOSIS — I270 Primary pulmonary hypertension: Secondary | ICD-10-CM | POA: Diagnosis not present

## 2020-07-09 DIAGNOSIS — R0902 Hypoxemia: Secondary | ICD-10-CM | POA: Diagnosis not present

## 2020-07-11 ENCOUNTER — Encounter: Payer: Self-pay | Admitting: Obstetrics and Gynecology

## 2020-07-18 DIAGNOSIS — E785 Hyperlipidemia, unspecified: Secondary | ICD-10-CM | POA: Diagnosis not present

## 2020-07-18 DIAGNOSIS — E119 Type 2 diabetes mellitus without complications: Secondary | ICD-10-CM | POA: Diagnosis not present

## 2020-07-18 DIAGNOSIS — G4733 Obstructive sleep apnea (adult) (pediatric): Secondary | ICD-10-CM | POA: Diagnosis not present

## 2020-07-18 DIAGNOSIS — M542 Cervicalgia: Secondary | ICD-10-CM | POA: Diagnosis not present

## 2020-07-18 DIAGNOSIS — J9611 Chronic respiratory failure with hypoxia: Secondary | ICD-10-CM | POA: Diagnosis not present

## 2020-07-18 DIAGNOSIS — I10 Essential (primary) hypertension: Secondary | ICD-10-CM | POA: Diagnosis not present

## 2020-07-18 DIAGNOSIS — G8929 Other chronic pain: Secondary | ICD-10-CM | POA: Diagnosis not present

## 2020-07-18 DIAGNOSIS — J439 Emphysema, unspecified: Secondary | ICD-10-CM | POA: Diagnosis not present

## 2020-07-18 DIAGNOSIS — Z9981 Dependence on supplemental oxygen: Secondary | ICD-10-CM | POA: Diagnosis not present

## 2020-07-25 ENCOUNTER — Other Ambulatory Visit: Payer: Self-pay | Admitting: Family Medicine

## 2020-07-25 DIAGNOSIS — E1169 Type 2 diabetes mellitus with other specified complication: Secondary | ICD-10-CM

## 2020-07-25 NOTE — Telephone Encounter (Signed)
Requested medication (s) are due for refill today: expired medication  Requested medication (s) are on the active medication list: yes  Last refill:  10/23/2018 #180 1 refill  Future visit scheduled: yes  Notes to clinic:  expired medication, Do you want to renew Rx?      Requested Prescriptions  Pending Prescriptions Disp Refills   metFORMIN (GLUCOPHAGE-XR) 500 MG 24 hr tablet [Pharmacy Med Name: METFORMIN  ER 500 MG] 30 tablet 0    Sig: TAKE 1 TABLET BY MOUTH DAILY WITH DINNER.      Endocrinology:  Diabetes - Biguanides Failed - 07/25/2020 11:25 AM      Failed - Cr in normal range and within 360 days    Creat  Date Value Ref Range Status  04/23/2019 0.93 0.50 - 0.99 mg/dL Final    Comment:    For patients >54 years of age, the reference limit for Creatinine is approximately 13% higher for people identified as African-American. .    Creatinine, Urine  Date Value Ref Range Status  04/23/2019 128 20 - 275 mg/dL Final          Failed - eGFR in normal range and within 360 days    GFR, Est African American  Date Value Ref Range Status  04/23/2019 75 > OR = 60 mL/min/1.24m Final   GFR, Est Non African American  Date Value Ref Range Status  04/23/2019 65 > OR = 60 mL/min/1.759mFinal          Passed - HBA1C is between 0 and 7.9 and within 180 days    Hemoglobin A1C  Date Value Ref Range Status  06/19/2020 5.9 (A) 4.0 - 5.6 % Final  10/23/2018 6.3  Final   Hgb A1c MFr Bld  Date Value Ref Range Status  04/23/2019 5.8 (H) <5.7 % of total Hgb Final    Comment:    For someone without known diabetes, a hemoglobin  A1c value between 5.7% and 6.4% is consistent with prediabetes and should be confirmed with a  follow-up test. . For someone with known diabetes, a value <7% indicates that their diabetes is well controlled. A1c targets should be individualized based on duration of diabetes, age, comorbid conditions, and other considerations. . This assay result is  consistent with an increased risk of diabetes. . Currently, no consensus exists regarding use of hemoglobin A1c for diagnosis of diabetes for children. . Renella Cunas Valid encounter within last 6 months    Recent Outpatient Visits           [facility] month [facility] with [facility]   4 months ago Uncontrolled hypertension   [facility]   7 months ago Uncontrolled hypertension   [facility]   7 months ago Uncontrolled hypertension   [facility]   8 months ago Right knee pain, unspecified chronicity   [facility]       Future Appointments             In 6 days  [facility] In 2 months SoSteele SizerMD [facility]

## 2020-07-25 NOTE — Telephone Encounter (Signed)
Pt would like to stop Metformin and recheck A1c at next visit.

## 2020-07-28 DIAGNOSIS — J9611 Chronic respiratory failure with hypoxia: Secondary | ICD-10-CM | POA: Diagnosis not present

## 2020-07-28 DIAGNOSIS — J449 Chronic obstructive pulmonary disease, unspecified: Secondary | ICD-10-CM | POA: Diagnosis not present

## 2020-07-31 ENCOUNTER — Ambulatory Visit (INDEPENDENT_AMBULATORY_CARE_PROVIDER_SITE_OTHER): Payer: Medicare HMO

## 2020-07-31 DIAGNOSIS — Z0000 Encounter for general adult medical examination without abnormal findings: Secondary | ICD-10-CM | POA: Diagnosis not present

## 2020-07-31 NOTE — Progress Notes (Signed)
Subjective:   Judy Allen is a 64 y.o. female who presents for an Initial Medicare Annual Wellness Visit.  Virtual Visit via Telephone Note  I connected with  Judy Allen on 07/31/20 at 10:40 AM EST by telephone and verified that I am speaking with the correct person using two identifiers.  Location: Patient: home Provider: [place] Persons participating in the virtual visit: [place]   I discussed the limitations, risks, security and privacy concerns of performing an evaluation and management service by telephone and the availability of in person appointments. The patient expressed understanding and agreed to proceed.  Interactive audio and video telecommunications were attempted between this nurse and patient, however failed, due to patient having technical difficulties OR patient did not have access to video capability.  We continued and completed visit with audio only.  Some vital signs may be absent or patient reported.   Clemetine Marker, LPN    Review of Systems     Cardiac Risk Factors include: advanced age (>49mn, >>51women);diabetes mellitus;dyslipidemia;hypertension;sedentary lifestyle;obesity (BMI >30kg/m2)     Objective:    There were no vitals filed for this visit. There is no height or weight on file to calculate BMI.  Advanced Directives 07/31/2020 05/13/2020 08/31/2018 05/16/2018 02/20/2018 02/16/2018 01/18/2018  Does Patient Have a Medical Advance Directive? Yes No Yes Yes Yes Yes -  Type of [place] - Healthcare Power of [place] Does patient want to make changes to medical advance directive? - - - - - - -  Copy of [place] Chart? No - copy requested - No - copy requested - - Yes -  Would patient like information on creating a medical advance directive? - No  - Patient declined No - Patient declined - - - -    Current Medications (verified) Outpatient Encounter Medications as of 07/31/2020  Medication Sig  . albuterol (PROVENTIL HFA;VENTOLIN HFA) 108 (90 Base) MCG/ACT inhaler Inhale 2 puffs into the lungs every 6 (six) hours as needed for wheezing or shortness of breath.  .Marland KitchenamLODipine (NORVASC) 2.5 MG tablet Take 1 tablet (2.5 mg total) by mouth daily.  .Jearl KlinefelterELLIPTA 62.5-25 MCG/INH AEPB INL 1 PUFF PO D  . ARIPiprazole (ABILIFY) 10 MG tablet Take 1 tablet (10 mg total) by mouth daily.  . blood glucose meter kit and supplies Dispense based on patient and insurance preference. Use up to four times daily as directed. (FOR ICD-10 E10.9, E11.9).  . CONTOUR NEXT TEST test strip USE UP TO FOUR TIMES DAILY AS DIRECTED  . fluticasone (FLONASE) 50 MCG/ACT nasal spray Place 2 sprays into both nostrils daily.  .Marland Kitchengabapentin (NEURONTIN) 300 MG capsule TAKE 1 CAPSULE(300 MG) BY MOUTH THREE TIMES DAILY  . gemfibrozil (LOPID) 600 MG tablet Take 600 mg by mouth 2 (two) times daily.  . hydrocortisone 2.5 % cream Apply topically 2 (two) times daily.  .Marland Kitchenicosapent Ethyl (VASCEPA) 1 g capsule Take by mouth.  . metoprolol succinate (TOPROL-XL) 50 MG 24 hr tablet Take 1 tablet (50 mg total) by mouth daily. with food  . Microlet Lancets MISC USE FOUR TIMES DAILY  . omega-3 acid ethyl esters (LOVAZA) 1 g capsule Take 2 capsules (2 g total) by mouth 2 (two) times daily.  .Marland Kitchenomeprazole (PRILOSEC) 40 MG capsule Take 1 capsule (40 mg total) by mouth daily.  .Marland Kitchen  OXYGEN Inhale 2 L into the lungs continuous.   . rosuvastatin (CRESTOR) 5 MG tablet Take 1 tablet (5 mg total) by mouth daily.  Marland Kitchen telmisartan (MICARDIS) 80 MG tablet Take 80 mg by mouth daily.  Marland Kitchen tiZANidine (ZANAFLEX) 4 MG tablet Take 1 tablet (4 mg total) by mouth every 8 (eight) hours as needed.  . torsemide (DEMADEX) 5 MG tablet Take 5 mg by mouth daily.  . traMADol (ULTRAM) 50 MG tablet Take 1 tablet (50 mg total)  by mouth every 6 (six) hours as needed for severe pain. Each fill must last 30 days  . venlafaxine XR (EFFEXOR-XR) 75 MG 24 hr capsule Take 3 capsules (225 mg total) by mouth daily with breakfast.  . Vitamin D, Ergocalciferol, (DRISDOL) 1.25 MG (50000 UT) CAPS capsule Take 1 capsule (50,000 Units total) by mouth every 7 (seven) days.  . betamethasone dipropionate (DIPROLENE) 0.05 % cream Apply 1 application topically 2 (two) times daily as needed. Avoid face , groin and axilla (Patient not taking: Reported on 07/31/2020)  . ketoconazole (NIZORAL) 2 % cream Apply 1 application topically 2 (two) times daily. (Patient not taking: Reported on 07/31/2020)  . metFORMIN (GLUCOPHAGE-XR) 500 MG 24 hr tablet Take 2 tablets (1,000 mg total) by mouth daily. (Patient not taking: Reported on 07/31/2020)  . triamcinolone cream (KENALOG) 0.1 % Apply topically 2 (two) times daily. (Patient not taking: Reported on 07/31/2020)   No facility-administered encounter medications on file as of 07/31/2020.    Allergies (verified) Dapsone, Hydrochlorothiazide, Sulfa antibiotics, and Sulfasalazine   History: Past Medical History:  Diagnosis Date  . Anemia    H/O  . Anxiety   . Arthritis   . Colon cancer ([place]) 2017   anal ca/chemo and rad  . Complication of anesthesia   . Constipation   . COPD (chronic obstructive pulmonary disease) ([facility])    also emphysema  . Depression   . Diabetes mellitus without complication ([place])   . Dysrhythmia    TACHYCARDIA-WELL CONTROLLED ON METOPROLO  . Emphysema of lung ([place])   . GERD (gastroesophageal reflux disease)   . H/O allergic rhinitis   . Heart murmur   . Hemorrhoid   . Hyperlipidemia   . Hypertension   . Macular degeneration   . Neck pain, chronic   . Neuritis   . Ovarian failure   . Personal history of chemotherapy   . Personal history of radiation therapy   . PONV (postoperative nausea and vomiting)   . Pre-diabetes 2019   just being followed.  no meds  .  Proteinuria   . Psoriasis   . Sleep apnea 09/12/2016   8cm H2O with 21pm oxygen mask: Eson size Medium. Heated humidifier for nasal dryness.  . Tachycardia, paroxysmal ([place])   . Urinary incontinence    Past Surgical History:  Procedure Laterality Date  . ANTERIOR FUSION LUMBAR SPINE  1990   plate in back, not metal  . CARDIAC CATHETERIZATION  2009?    [facility];[place]  . CARDIAC CATHETERIZATION  05/2012   [facility]: Mild hypertension 37/12 with a mean pressure of 21 mm mercury  . CHOLECYSTECTOMY N/A 09/12/2017   Procedure: LAPAROSCOPIC CHOLECYSTECTOMY WITH INTRAOPERATIVE CHOLANGIOGRAM;  Surgeon: Robert Bellow, MD;  Location: [facility] ORS;  Service: General;  Laterality: N/A;  . COLONOSCOPY  2018  . herniated disc repair  2001  . LIVER BIOPSY N/A 09/12/2017   Procedure: LIVER BIOPSY;  Surgeon: Robert Bellow, MD;  Location: [facility] ORS;  Service: General;  Laterality: N/A;  .  MASS EXCISION Left 02/23/2016   Procedure: EXCISION MASS;  Surgeon: Christene Lye, MD;  Location: [facility] ORS;  Service: General;  Laterality: Left;  . NECK SURGERY    . PORT-A-CATH REMOVAL  2018  . PORTACATH PLACEMENT Left 03/12/2016   Procedure: INSERTION PORT-A-CATH;  Surgeon: Christene Lye, MD;  Location: [facility] ORS;  Service: General;  Laterality: Left;  . SPINE SURGERY    . tonsillectomy  1959   history  . TRIGGER FINGER RELEASE Right 09/2018   Dr. Sabra Heck   . TUBAL LIGATION     s/p   Family History  Problem Relation Age of Onset  . Heart attack Mother   . Asthma Mother   . Liver cancer Father   . Breast cancer Maternal Aunt   . Bladder Cancer Neg Hx   . Kidney cancer Neg Hx   . Colon cancer Neg Hx    Social History   Socioeconomic History  . Marital status: Widowed    Spouse name: Automotive engineer  . Number of children: 3  . Years of education: Not on file  . Highest education level: 11th grade  Occupational History  . Not on file  Tobacco Use  . Smoking status: Former Smoker    Packs/day: 1.00     Years: 32.00    Pack years: 32.00    Types: Cigarettes    Start date: 08/10/1963    Quit date: 01/15/2006    Years since quitting: 14.5  . Smokeless tobacco: Never Used  Vaping Use  . Vaping Use: Never used  Substance and Sexual Activity  . Alcohol use: No    Alcohol/week: 0.0 standard drinks  . Drug use: No  . Sexual activity: Not Currently    Birth control/protection: Post-menopausal  Other Topics Concern  . Not on file  Social History Narrative   Husband passed away on 01-11-2018   She was living with [name] for few years, but moved in with Candace her oldest daughter this past Summer.    Social Determinants of Health   Financial Resource Strain: Low Risk   . Difficulty of Paying Living Expenses: Not very hard  Food Insecurity: No Food Insecurity  . Worried About Charity fundraiser in the Last Year: Never true  . Ran Out of Food in the Last Year: Never true  Transportation Needs: No Transportation Needs  . Lack of Transportation (Medical): No  . Lack of Transportation (Non-Medical): No  Physical Activity: Inactive  . Days of Exercise per Week: 0 days  . Minutes of Exercise per Session: 0 min  Stress: No Stress Concern Present  . Feeling of Stress : Not at all  Social Connections: Socially Isolated  . Frequency of Communication with Friends and Family: More than three times a week  . Frequency of Social Gatherings with Friends and Family: More than three times a week  . Attends Religious Services: Never  . Active Member of Clubs or Organizations: No  . Attends Archivist Meetings: Never  . Marital Status: Widowed    Tobacco Counseling Counseling given: Not Answered   Clinical Intake:  Pre-visit preparation completed: Yes  Pain : No/denies pain     Nutritional Risks: None Diabetes: Yes CBG done?: No Did pt. bring in CBG monitor from home?: No  How often do you need to have someone help you when you read instructions, pamphlets, or other  written materials from your doctor or pharmacy?: 1 - Never  Nutrition Risk Assessment:  Has the patient had any N/V/D within the last 2 months?  No  Does the patient have any non-healing wounds?  No  Has the patient had any unintentional weight loss or weight gain?  No   Diabetes:  Is the patient diabetic?  Yes  If diabetic, was a CBG obtained today?  No  Did the patient bring in their glucometer from home?  No  How often do you monitor your CBG's? Twice weekly.   Financial Strains and Diabetes Management:  Are you having any financial strains with the device, your supplies or your medication? No .  Does the patient want to be seen by Chronic Care Management for management of their diabetes?  No  Would the patient like to be referred to a Nutritionist or for Diabetic Management?  No   Diabetic Exams:  Diabetic Eye Exam: Completed per patient; will request records from Dr. Jeannett Senior at [facility].   Diabetic Foot Exam: Completed 06/19/20.  Interpreter Needed?: No  Information entered by :: Clemetine Marker LPN   Activities of Daily Living In your present state of health, do you have any difficulty performing the following activities: 07/31/2020 06/19/2020  Hearing? N N  Comment declines hearing aids -  Vision? N N  Difficulty concentrating or making decisions? Y N  Walking or climbing stairs? Y Y  Dressing or bathing? Y Y  Doing errands, shopping? Tempie Donning  Preparing Food and eating ? N -  Using the Toilet? N -  In the past six months, have you accidently leaked urine? N -  Do you have problems with loss of bowel control? N -  Managing your Medications? N -  Managing your Finances? N -  Housekeeping or managing your Housekeeping? N -  Some recent data might be hidden    Patient Care Team: Steele Sizer, MD as PCP - General Steele Sizer, MD as Attending Physician (Family Medicine) Lucilla Lame, MD as Consulting Physician (Gastroenterology) Carolin Coy, MD as Radiation  Oncologist (Radiation Oncology) Rubie Maid, MD as Referring Physician (Obstetrics and Gynecology) Gabriel Carina Betsey Holiday, MD as Physician Assistant (Endocrinology) Phoebe Sharps, MD as Referring Physician (Pulmonary Disease) Jimmye Norman, MD (Inactive) as Resident (Pulmonary Disease)  Indicate any recent Medical Services you may have received from other than [facility] providers in the past year (date may be approximate).     Assessment:   This is a routine wellness examination for [facility].  Hearing/Vision screen  Hearing Screening   125Hz  250Hz  500Hz  1000Hz  2000Hz  3000Hz  4000Hz  6000Hz  8000Hz   Right ear:           Left ear:           Comments: Pt denies hearing difficulty  Vision Screening Comments: Annual vision screenings done by Dr. Jeannett Senior at [place] issues and exercise activities discussed: Current Exercise Habits: The patient does not participate in regular exercise at present, Exercise limited by: respiratory conditions(s);neurologic condition(s)  Goals    . Weight (lb) < 175 lb (79.4 kg)     Patient would like to lose weight with healthy eating over the next year.       Depression Screen PHQ 2/9 Scores 07/31/2020 06/19/2020 06/19/2020 05/13/2020 03/19/2020 12/25/2019 12/04/2019  PHQ - 2 Score 2 6 3  0 2 6 5   PHQ- 9 Score 5 11 - - 6 10 19   Exception Documentation - - - - - - -    Fall Risk Fall Risk  07/31/2020 06/19/2020 05/13/2020 03/19/2020 12/25/2019  Falls  in the past year? 0 0 0 0 0  Number falls in past yr: 0 0 - 0 0  Injury with Fall? 0 0 - 0 0  Risk for fall due to : No Fall Risks - - - -  Follow up Falls prevention discussed - - - -    FALL RISK PREVENTION PERTAINING TO THE HOME:  Any stairs in or around the home? Yes  If so, are there any without handrails? No  Home free of loose throw rugs in walkways, pet beds, electrical cords, etc? Yes  Adequate lighting in your home to reduce risk of falls? Yes   ASSISTIVE DEVICES UTILIZED TO PREVENT  FALLS:  Life alert? No  Use of a cane, walker or w/c? No  Grab bars in the bathroom? Yes  Shower chair or bench in shower? No  Elevated toilet seat or a handicapped toilet? No   TIMED UP AND GO:  Was the test performed? No . Telephonic visit.   Cognitive Function:     6CIT Screen 07/31/2020  What Year? 0 points  What month? 0 points  What time? 0 points  Count back from 20 0 points  Months in reverse 0 points  Repeat phrase 2 points  Total Score 2    Immunizations Immunization History  Administered Date(s) Administered  . Influenza, Seasonal, Injecte, Preservative Fre 05/09/2012  . Influenza,inj,Quad PF,6+ Mos 05/20/2014, 05/20/2016, 04/27/2017, 04/27/2018, 04/23/2019, 05/16/2020  . Influenza-Unspecified 05/20/2014, 05/20/2016, 04/27/2017, 06/04/2017, 04/27/2018, 04/13/2019  . Moderna Sars-Covid-2 Vaccination 10/31/2019, 11/28/2019, 06/16/2020  . Pneumococcal Conjugate-13 06/19/2020  . Pneumococcal Polysaccharide-23 03/10/2016  . Td 03/03/2009  . Tdap 03/03/2009, 04/23/2019    TDAP status: Up to date  Flu Vaccine status: Up to date  Pneumococcal vaccine status: Up to date  Covid-19 vaccine status: Completed vaccines  Qualifies for Shingles Vaccine? Yes   Zostavax completed No   Shingrix Completed?: No.    Education has been provided regarding the importance of this vaccine. Patient has been advised to call insurance company to determine out of pocket expense if they have not yet received this vaccine. Advised may also receive vaccine at local pharmacy or Health Dept. Verbalized acceptance and understanding.  Screening Tests Health Maintenance  Topic Date Due  . OPHTHALMOLOGY EXAM  Never done  . MAMMOGRAM  07/26/2020  . COVID-19 Vaccine (4 - Booster for Moderna series) 12/14/2020  . HEMOGLOBIN A1C  12/17/2020  . PAP SMEAR-Modifier  05/15/2021  . FOOT EXAM  06/19/2021  . PNA vac Low Risk Adult (2 of 2 - PPSV23) 06/19/2021  . COLONOSCOPY  03/17/2026  .  TETANUS/TDAP  04/22/2029  . INFLUENZA VACCINE  Completed  . DEXA SCAN  Completed  . Hepatitis C Screening  Completed  . HIV Screening  Completed    Health Maintenance  Health Maintenance Due  Topic Date Due  . OPHTHALMOLOGY EXAM  Never done  . MAMMOGRAM  07/26/2020    Colorectal cancer screening: Type of screening: Colonoscopy. Completed 03/17/16. Repeat every 10 years  Mammogram status: Completed 07/27/19. Repeat every year  Bone Density status: Completed 05/06/20. Results reflect: Bone density results: OSTEOPENIA. Repeat every 2 years.  Lung Cancer Screening: (Low Dose CT Chest recommended if Age 8-80 years, 30 pack-year currently smoking OR have quit w/in 15years.) does not qualify.   Additional Screening:  Hepatitis C Screening: does qualify; Completed 12/12/19  Vision Screening: Recommended annual ophthalmology exams for early detection of glaucoma and other disorders of the eye. Is the patient up to  date with their annual eye exam?  Yes  Who is the provider or what is the name of the office in which the patient attends annual eye exams? Dr. Jeannett Senior   Dental Screening: Recommended annual dental exams for proper oral hygiene  Community Resource Referral / Chronic Care Management: CRR required this visit?  No   CCM required this visit?  No      Plan:     I have personally reviewed and noted the following in the patient's chart:   . Medical and social history . Use of alcohol, tobacco or illicit drugs  . Current medications and supplements . Functional ability and status . Nutritional status . Physical activity . Advanced directives . List of other physicians . Hospitalizations, surgeries, and ER visits in previous 12 months . Vitals . Screenings to include cognitive, depression, and falls . Referrals and appointments  In addition, I have reviewed and discussed with patient certain preventive protocols, quality metrics, and best practice recommendations. A written  personalized care plan for preventive services as well as general preventive health recommendations were provided to patient.     Clemetine Marker, LPN   [id number]   Nurse Notes: none

## 2020-07-31 NOTE — Patient Instructions (Signed)
Judy Allen , Thank you for taking time to come for your Medicare Wellness Visit. I appreciate your ongoing commitment to your health goals. Please review the following plan we discussed and let me know if I can assist you in the future.   Screening recommendations/referrals: Colonoscopy: done 03/17/16. Repeat in 2027 Mammogram: done 05/12/20 Bone Density: done 05/06/20 Recommended yearly ophthalmology/optometry visit for glaucoma screening and checkup Recommended yearly dental visit for hygiene and checkup  Vaccinations: Influenza vaccine: done 05/16/20 Pneumococcal vaccine: done 06/19/20 Tdap vaccine: done 04/23/19 Shingles vaccine: Shingrix discussed. Please contact your pharmacy for coverage information.  Covid-19:done 10/31/19, 11/28/19 & 06/16/20  Advanced directives: Please bring a copy of your health care power of attorney and living will to the office at your convenience.  Conditions/risks identified: Recommend healthy eating for weight loss  Next appointment: Follow up in one year for your annual wellness visit    Preventive Care 65 Years and Older, Female Preventive care refers to lifestyle choices and visits with your health care provider that can promote health and wellness. What does preventive care include?  A yearly physical exam. This is also called an annual well check.  Dental exams once or twice a year.  Routine eye exams. Ask your health care provider how often you should have your eyes checked.  Personal lifestyle choices, including:  Daily care of your teeth and gums.  Regular physical activity.  Eating a healthy diet.  Avoiding tobacco and drug use.  Limiting alcohol use.  Practicing safe sex.  Taking low-dose aspirin every day.  Taking vitamin and mineral supplements as recommended by your health care provider. What happens during an annual well check? The services and screenings done by your health care provider during your annual well check will  depend on your age, overall health, lifestyle risk factors, and family history of disease. Counseling  Your health care provider may ask you questions about your:  Alcohol use.  Tobacco use.  Drug use.  Emotional well-being.  Home and relationship well-being.  Sexual activity.  Eating habits.  History of falls.  Memory and ability to understand (cognition).  Work and work Statistician.  Reproductive health. Screening  You may have the following tests or measurements:  Height, weight, and BMI.  Blood pressure.  Lipid and cholesterol levels. These may be checked every 5 years, or more frequently if you are over 76 years old.  Skin check.  Lung cancer screening. You may have this screening every year starting at age 53 if you have a 30-pack-year history of smoking and currently smoke or have quit within the past 15 years.  Fecal occult blood test (FOBT) of the stool. You may have this test every year starting at age 8.  Flexible sigmoidoscopy or colonoscopy. You may have a sigmoidoscopy every 5 years or a colonoscopy every 10 years starting at age 74.  Hepatitis C blood test.  Hepatitis B blood test.  Sexually transmitted disease (STD) testing.  Diabetes screening. This is done by checking your blood sugar (glucose) after you have not eaten for a while (fasting). You may have this done every 1-3 years.  Bone density scan. This is done to screen for osteoporosis. You may have this done starting at age 70.  Mammogram. This may be done every 1-2 years. Talk to your health care provider about how often you should have regular mammograms. Talk with your health care provider about your test results, treatment options, and if necessary, the need for more tests.  Vaccines  Your health care provider may recommend certain vaccines, such as:  Influenza vaccine. This is recommended every year.  Tetanus, diphtheria, and acellular pertussis (Tdap, Td) vaccine. You may need a  Td booster every 10 years.  Zoster vaccine. You may need this after age 70.  Pneumococcal 13-valent conjugate (PCV13) vaccine. One dose is recommended after age 33.  Pneumococcal polysaccharide (PPSV23) vaccine. One dose is recommended after age 21. Talk to your health care provider about which screenings and vaccines you need and how often you need them. This information is not intended to replace advice given to you by your health care provider. Make sure you discuss any questions you have with your health care provider. Document Released: 08/22/2015 Document Revised: 04/14/2016 Document Reviewed: 05/27/2015 Elsevier Interactive Patient Education  2017 [place] Prevention in the Home Falls can cause injuries. They can happen to people of all ages. There are many things you can do to make your home safe and to help prevent falls. What can I do on the outside of my home?  Regularly fix the edges of walkways and driveways and fix any cracks.  Remove anything that might make you trip as you walk through a door, such as a raised step or threshold.  Trim any bushes or trees on the path to your home.  Use bright outdoor lighting.  Clear any walking paths of anything that might make someone trip, such as rocks or tools.  Regularly check to see if handrails are loose or broken. Make sure that both sides of any steps have handrails.  Any raised decks and porches should have guardrails on the edges.  Have any leaves, snow, or ice cleared regularly.  Use sand or salt on walking paths during winter.  Clean up any spills in your garage right away. This includes oil or grease spills. What can I do in the bathroom?  Use night lights.  Install grab bars by the toilet and in the tub and shower. Do not use towel bars as grab bars.  Use non-skid mats or decals in the tub or shower.  If you need to sit down in the shower, use a plastic, non-slip stool.  Keep the floor dry. Clean  up any water that spills on the floor as soon as it happens.  Remove soap buildup in the tub or shower regularly.  Attach bath mats securely with double-sided non-slip rug tape.  Do not have throw rugs and other things on the floor that can make you trip. What can I do in the bedroom?  Use night lights.  Make sure that you have a light by your bed that is easy to reach.  Do not use any sheets or blankets that are too big for your bed. They should not hang down onto the floor.  Have a firm chair that has side arms. You can use this for support while you get dressed.  Do not have throw rugs and other things on the floor that can make you trip. What can I do in the kitchen?  Clean up any spills right away.  Avoid walking on wet floors.  Keep items that you use a lot in easy-to-reach places.  If you need to reach something above you, use a strong step stool that has a grab bar.  Keep electrical cords out of the way.  Do not use floor polish or wax that makes floors slippery. If you must use wax, use non-skid floor wax.  Do not have throw rugs and other things on the floor that can make you trip. What can I do with my stairs?  Do not leave any items on the stairs.  Make sure that there are handrails on both sides of the stairs and use them. Fix handrails that are broken or loose. Make sure that handrails are as long as the stairways.  Check any carpeting to make sure that it is firmly attached to the stairs. Fix any carpet that is loose or worn.  Avoid having throw rugs at the top or bottom of the stairs. If you do have throw rugs, attach them to the floor with carpet tape.  Make sure that you have a light switch at the top of the stairs and the bottom of the stairs. If you do not have them, ask someone to add them for you. What else can I do to help prevent falls?  Wear shoes that:  Do not have high heels.  Have rubber bottoms.  Are comfortable and fit you well.  Are  closed at the toe. Do not wear sandals.  If you use a stepladder:  Make sure that it is fully opened. Do not climb a closed stepladder.  Make sure that both sides of the stepladder are locked into place.  Ask someone to hold it for you, if possible.  Clearly mark and make sure that you can see:  Any grab bars or handrails.  First and last steps.  Where the edge of each step is.  Use tools that help you move around (mobility aids) if they are needed. These include:  Canes.  Walkers.  Scooters.  Crutches.  Turn on the lights when you go into a dark area. Replace any light bulbs as soon as they burn out.  Set up your furniture so you have a clear path. Avoid moving your furniture around.  If any of your floors are uneven, fix them.  If there are any pets around you, be aware of where they are.  Review your medicines with your doctor. Some medicines can make you feel dizzy. This can increase your chance of falling. Ask your doctor what other things that you can do to help prevent falls. This information is not intended to replace advice given to you by your health care provider. Make sure you discuss any questions you have with your health care provider. Document Released: 05/22/2009 Document Revised: 01/01/2016 Document Reviewed: 08/30/2014 Elsevier Interactive Patient Education  2017 [employer].

## 2020-08-09 DIAGNOSIS — C211 Malignant neoplasm of anal canal: Secondary | ICD-10-CM | POA: Diagnosis not present

## 2020-08-09 DIAGNOSIS — I270 Primary pulmonary hypertension: Secondary | ICD-10-CM | POA: Diagnosis not present

## 2020-08-09 DIAGNOSIS — R0902 Hypoxemia: Secondary | ICD-10-CM | POA: Diagnosis not present

## 2020-08-19 ENCOUNTER — Other Ambulatory Visit: Payer: Self-pay

## 2020-08-19 DIAGNOSIS — K219 Gastro-esophageal reflux disease without esophagitis: Secondary | ICD-10-CM

## 2020-08-19 DIAGNOSIS — I479 Paroxysmal tachycardia, unspecified: Secondary | ICD-10-CM

## 2020-08-19 DIAGNOSIS — E1169 Type 2 diabetes mellitus with other specified complication: Secondary | ICD-10-CM

## 2020-08-19 DIAGNOSIS — F411 Generalized anxiety disorder: Secondary | ICD-10-CM

## 2020-08-19 DIAGNOSIS — F339 Major depressive disorder, recurrent, unspecified: Secondary | ICD-10-CM

## 2020-08-19 DIAGNOSIS — I10 Essential (primary) hypertension: Secondary | ICD-10-CM

## 2020-08-19 DIAGNOSIS — E785 Hyperlipidemia, unspecified: Secondary | ICD-10-CM

## 2020-08-19 MED ORDER — ROSUVASTATIN CALCIUM 5 MG PO TABS: 5.0000 mg | ORAL_TABLET | Freq: Every day | ORAL | 0 refills | Status: DC

## 2020-08-19 MED ORDER — VENLAFAXINE HCL ER 75 MG PO CP24: 225.0000 mg | ORAL_CAPSULE | Freq: Every day | ORAL | 0 refills | Status: DC

## 2020-08-19 MED ORDER — OMEPRAZOLE 40 MG PO CPDR: 40.0000 mg | DELAYED_RELEASE_CAPSULE | Freq: Every day | ORAL | 1 refills | Status: DC

## 2020-08-19 MED ORDER — AMLODIPINE BESYLATE 2.5 MG PO TABS: 2.5000 mg | ORAL_TABLET | Freq: Every day | ORAL | 0 refills | Status: DC

## 2020-08-19 MED ORDER — METOPROLOL SUCCINATE ER 50 MG PO TB24: 50.0000 mg | ORAL_TABLET | Freq: Every day | ORAL | 0 refills | Status: DC

## 2020-08-22 DIAGNOSIS — I10 Essential (primary) hypertension: Secondary | ICD-10-CM | POA: Diagnosis not present

## 2020-08-22 DIAGNOSIS — E785 Hyperlipidemia, unspecified: Secondary | ICD-10-CM | POA: Diagnosis not present

## 2020-08-22 DIAGNOSIS — M542 Cervicalgia: Secondary | ICD-10-CM | POA: Diagnosis not present

## 2020-08-22 DIAGNOSIS — G8929 Other chronic pain: Secondary | ICD-10-CM | POA: Diagnosis not present

## 2020-08-22 DIAGNOSIS — E119 Type 2 diabetes mellitus without complications: Secondary | ICD-10-CM | POA: Diagnosis not present

## 2020-08-22 DIAGNOSIS — G4733 Obstructive sleep apnea (adult) (pediatric): Secondary | ICD-10-CM | POA: Diagnosis not present

## 2020-08-22 DIAGNOSIS — J439 Emphysema, unspecified: Secondary | ICD-10-CM | POA: Diagnosis not present

## 2020-08-28 ENCOUNTER — Other Ambulatory Visit (HOSPITAL_COMMUNITY)
Admission: RE | Admit: 2020-08-28 | Discharge: 2020-08-28 | Disposition: A | Discharge status: Home / Self Care | Payer: Medicare Other | Source: Ambulatory Visit | Attending: Obstetrics and Gynecology | Admitting: Obstetrics and Gynecology

## 2020-08-28 ENCOUNTER — Other Ambulatory Visit: Payer: Self-pay

## 2020-08-28 ENCOUNTER — Encounter: Payer: Self-pay | Admitting: Obstetrics and Gynecology

## 2020-08-28 ENCOUNTER — Ambulatory Visit (INDEPENDENT_AMBULATORY_CARE_PROVIDER_SITE_OTHER): Payer: Medicare Other | Admitting: Obstetrics and Gynecology

## 2020-08-28 VITALS — BP 142/83 | HR 84 | Ht 64.0 in | Wt 193.1 lb

## 2020-08-28 DIAGNOSIS — L578 Other skin changes due to chronic exposure to nonionizing radiation: Secondary | ICD-10-CM | POA: Diagnosis not present

## 2020-08-28 DIAGNOSIS — I10 Essential (primary) hypertension: Secondary | ICD-10-CM | POA: Diagnosis not present

## 2020-08-28 DIAGNOSIS — J432 Centrilobular emphysema: Secondary | ICD-10-CM | POA: Diagnosis not present

## 2020-08-28 DIAGNOSIS — Z9189 Other specified personal risk factors, not elsewhere classified: Secondary | ICD-10-CM | POA: Diagnosis not present

## 2020-08-28 DIAGNOSIS — E1169 Type 2 diabetes mellitus with other specified complication: Secondary | ICD-10-CM

## 2020-08-28 DIAGNOSIS — J9611 Chronic respiratory failure with hypoxia: Secondary | ICD-10-CM | POA: Diagnosis not present

## 2020-08-28 DIAGNOSIS — G4733 Obstructive sleep apnea (adult) (pediatric): Secondary | ICD-10-CM | POA: Diagnosis not present

## 2020-08-28 DIAGNOSIS — Z124 Encounter for screening for malignant neoplasm of cervix: Secondary | ICD-10-CM

## 2020-08-28 DIAGNOSIS — J9610 Chronic respiratory failure, unspecified whether with hypoxia or hypercapnia: Secondary | ICD-10-CM | POA: Diagnosis not present

## 2020-08-28 DIAGNOSIS — Z1151 Encounter for screening for human papillomavirus (HPV): Secondary | ICD-10-CM | POA: Insufficient documentation

## 2020-08-28 DIAGNOSIS — N952 Postmenopausal atrophic vaginitis: Secondary | ICD-10-CM

## 2020-08-28 DIAGNOSIS — E669 Obesity, unspecified: Secondary | ICD-10-CM

## 2020-08-28 DIAGNOSIS — Z85048 Personal history of other malignant neoplasm of rectum, rectosigmoid junction, and anus: Secondary | ICD-10-CM | POA: Diagnosis not present

## 2020-08-28 DIAGNOSIS — Z9981 Dependence on supplemental oxygen: Secondary | ICD-10-CM | POA: Diagnosis not present

## 2020-08-28 DIAGNOSIS — M8588 Other specified disorders of bone density and structure, other site: Secondary | ICD-10-CM | POA: Diagnosis not present

## 2020-08-28 DIAGNOSIS — R21 Rash and other nonspecific skin eruption: Secondary | ICD-10-CM | POA: Diagnosis not present

## 2020-08-28 DIAGNOSIS — E781 Pure hyperglyceridemia: Secondary | ICD-10-CM

## 2020-08-28 DIAGNOSIS — Z9989 Dependence on other enabling machines and devices: Secondary | ICD-10-CM | POA: Diagnosis not present

## 2020-08-28 MED ORDER — NYSTATIN 100000 UNIT/GM EX CREA: 1.0000 "application " | TOPICAL_CREAM | Freq: Two times a day (BID) | CUTANEOUS | 1 refills | Status: DC

## 2020-08-28 NOTE — Progress Notes (Signed)
ANNUAL PREVENTATIVE CARE GYNECOLOGY  ENCOUNTER NOTE  Subjective:       Judy Allen is a 65 y.o. G38P4 female here for a routine annual gynecologic exam. She has a PMH significant for anal squamous cell carcinoma s/p radiation therapy in 2017. The patient is not sexually active. The patient is not taking hormone replacement therapy. Patient denies post-menopausal vaginal bleeding. The patient wears seatbelts: yes. The patient participates in regular exercise: no. Has the patient ever been transfused or tattooed?: no. The patient reports that there is not domestic violence in her life.  Current complaints: 1.  Patient notes vaginal discharge, itching and dryness for several months.  Thinks it may be related to post-radiation effects.    Gynecologic History Patient's last menstrual period was 03/04/2002 (approximate). Contraception: post menopausal status Last Pap:05/2017. Results were: ASCUS HR HPV neg. Last mammogram: 05/12/2020 (performed at [facility]). Results were: BI-RADS CATEGORY  1: Negative. Last Colonoscopy: 03/2016.  Results were: abnormal - 2 sessile polyps in sigmoid and transverse colon. Multiple diverticula. (performed at [facility]).  Dexa Scan: 05/06/2020. Results were: osteopenia (minimal change from prior scan).    Obstetric History OB History  Gravida Para Term Preterm AB Living  4 4       4   SAB IAB Ectopic Multiple Live Births          4    # Outcome Date GA Lbr Len/2nd Weight Sex Delivery Anes PTL Lv  4 Para           3 Para           2 Para           1 Para             Obstetric Comments  1st Menstrual Cycle:  12   1st Pregnancy:  16    Past Medical History:  Diagnosis Date  . Anemia    H/O  . Anxiety   . Arthritis   . Colon cancer ([place]) 2017   anal ca/chemo and rad  . Complication of anesthesia   . Constipation   . COPD (chronic obstructive pulmonary disease) (H[facility])    also emphysema  . Depression   . Diabetes mellitus without complication ([place])   .  Dysrhythmia    TACHYCARDIA-WELL CONTROLLED ON METOPROLO  . Emphysema of lung ([place])   . GERD (gastroesophageal reflux disease)   . H/O allergic rhinitis   . Heart murmur   . Hemorrhoid   . Hyperlipidemia   . Hypertension   . Macular degeneration   . Neck pain, chronic   . Neuritis   . Ovarian failure   . Personal history of chemotherapy   . Personal history of radiation therapy   . PONV (postoperative nausea and vomiting)   . Pre-diabetes 2019   just being followed.  no meds  . Proteinuria   . Psoriasis   . Sleep apnea 09/12/2016   8cm H2O with 21pm oxygen mask: [place] size Medium. Heated humidifier for nasal dryness.  . Tachycardia, paroxysmal ([place])   . Urinary incontinence     Family History  Problem Relation Age of Onset  . Heart attack Mother   . Asthma Mother   . Liver cancer Father   . Breast cancer Maternal Aunt   . Bladder Cancer Neg Hx   . Kidney cancer Neg Hx   . Colon cancer Neg Hx     Past Surgical History:  Procedure Laterality Date  . ANTERIOR FUSION LUMBAR SPINE  1990   plate in back, not metal  . CARDIAC CATHETERIZATION  2009?    [facility];[place]  . CARDIAC CATHETERIZATION  05/2012   [facility]: Mild hypertension 37/12 with a mean pressure of 21 mm mercury  . CHOLECYSTECTOMY N/A 09/12/2017   Procedure: LAPAROSCOPIC CHOLECYSTECTOMY WITH INTRAOPERATIVE CHOLANGIOGRAM;  Surgeon: Robert Bellow, MD;  Location: [facility] ORS;  Service: General;  Laterality: N/A;  . COLONOSCOPY  2018  . herniated disc repair  2001  . LIVER BIOPSY N/A 09/12/2017   Procedure: LIVER BIOPSY;  Surgeon: Robert Bellow, MD;  Location: [facility] ORS;  Service: General;  Laterality: N/A;  . MASS EXCISION Left 02/23/2016   Procedure: EXCISION MASS;  Surgeon: Christene Lye, MD;  Location: [facility] ORS;  Service: General;  Laterality: Left;  . NECK SURGERY    . PORT-A-CATH REMOVAL  2018  . PORTACATH PLACEMENT Left 03/12/2016   Procedure: INSERTION PORT-A-CATH;  Surgeon: Christene Lye, MD;   Location: [facility] ORS;  Service: General;  Laterality: Left;  . SPINE SURGERY    . tonsillectomy  1959   history  . TRIGGER FINGER RELEASE Right 09/2018   Dr. Sabra Heck   . TUBAL LIGATION     s/p    Social History   Socioeconomic History  . Marital status: Widowed    Spouse name: Automotive engineer  . Number of children: 3  . Years of education: Not on file  . Highest education level: 11th grade  Occupational History  . Not on file  Tobacco Use  . Smoking status: Former Smoker    Packs/day: 1.00    Years: 32.00    Pack years: 32.00    Types: Cigarettes    Start date: 08/10/1963    Quit date: 01/15/2006    Years since quitting: 14.6  . Smokeless tobacco: Never Used  Vaping Use  . Vaping Use: Never used  Substance and Sexual Activity  . Alcohol use: No    Alcohol/week: 0.0 standard drinks  . Drug use: No  . Sexual activity: Not Currently    Birth control/protection: Post-menopausal  Other Topics Concern  . Not on file  Social History Narrative   Husband passed away on 01-03-18   She was living with [name] for few years, but moved in with Candace her oldest daughter this past Summer.    Social Determinants of Health   Financial Resource Strain: Low Risk   . Difficulty of Paying Living Expenses: Not very hard  Food Insecurity: No Food Insecurity  . Worried About Charity fundraiser in the Last Year: Never true  . Ran Out of Food in the Last Year: Never true  Transportation Needs: No Transportation Needs  . Lack of Transportation (Medical): No  . Lack of Transportation (Non-Medical): No  Physical Activity: Inactive  . Days of Exercise per Week: 0 days  . Minutes of Exercise per Session: 0 min  Stress: No Stress Concern Present  . Feeling of Stress : Not at all  Social Connections: Socially Isolated  . Frequency of Communication with Friends and Family: More than three times a week  . Frequency of Social Gatherings with Friends and Family: More than three times a week  .  Attends Religious Services: Never  . Active Member of Clubs or Organizations: No  . Attends Archivist Meetings: Never  . Marital Status: Widowed  Intimate Partner Violence: Not At Risk  . Fear of Current or Ex-Partner: No  . Emotionally Abused: No  .  Physically Abused: No  . Sexually Abused: No    Current Outpatient Medications on File Prior to Visit  Medication Sig Dispense Refill  . albuterol (PROVENTIL HFA;VENTOLIN HFA) 108 (90 Base) MCG/ACT inhaler Inhale 2 puffs into the lungs every 6 (six) hours as needed for wheezing or shortness of breath.    Marland Kitchen amLODipine (NORVASC) 2.5 MG tablet Take 1 tablet (2.5 mg total) by mouth daily. 90 tablet 0  . ANORO ELLIPTA 62.5-25 MCG/INH AEPB INL 1 PUFF PO D    . ARIPiprazole (ABILIFY) 10 MG tablet Take 1 tablet (10 mg total) by mouth daily. 90 tablet 0  . blood glucose meter kit and supplies Dispense based on patient and insurance preference. Use up to four times daily as directed. (FOR ICD-10 E10.9, E11.9). 1 each 0  . CONTOUR NEXT TEST test strip USE UP TO FOUR TIMES DAILY AS DIRECTED 100 each 5  . fluticasone (FLONASE) 50 MCG/ACT nasal spray Place 2 sprays into both nostrils daily. 16 g 2  . gabapentin (NEURONTIN) 300 MG capsule TAKE 1 CAPSULE(300 MG) BY MOUTH THREE TIMES DAILY 90 capsule 5  . gemfibrozil (LOPID) 600 MG tablet Take 600 mg by mouth 2 (two) times daily.    . hydrocortisone 2.5 % cream Apply topically 2 (two) times daily. 30 g 0  . metoprolol succinate (TOPROL-XL) 50 MG 24 hr tablet Take 1 tablet (50 mg total) by mouth daily. with food 90 tablet 0  . Microlet Lancets MISC USE FOUR TIMES DAILY 100 each 5  . omega-3 acid ethyl esters (LOVAZA) 1 g capsule Take 2 capsules (2 g total) by mouth 2 (two) times daily. 360 capsule 1  . omeprazole (PRILOSEC) 40 MG capsule Take 1 capsule (40 mg total) by mouth daily. 90 capsule 1  . OXYGEN Inhale 2 L into the lungs continuous.     . rosuvastatin (CRESTOR) 5 MG tablet Take 1 tablet (5  mg total) by mouth daily. 90 tablet 0  . telmisartan (MICARDIS) 80 MG tablet Take 80 mg by mouth daily.    Marland Kitchen tiZANidine (ZANAFLEX) 4 MG tablet Take 1 tablet (4 mg total) by mouth every 8 (eight) hours as needed. 90 tablet 5  . torsemide (DEMADEX) 5 MG tablet Take 5 mg by mouth daily.    . traMADol (ULTRAM) 50 MG tablet Take 1 tablet (50 mg total) by mouth every 6 (six) hours as needed for severe pain. Each fill must last 30 days 120 tablet 5  . venlafaxine XR (EFFEXOR-XR) 75 MG 24 hr capsule Take 3 capsules (225 mg total) by mouth daily with breakfast. 270 capsule 0  . Vitamin D, Ergocalciferol, (DRISDOL) 1.25 MG (50000 UT) CAPS capsule Take 1 capsule (50,000 Units total) by mouth every 7 (seven) days. 12 capsule 1  . betamethasone dipropionate (DIPROLENE) 0.05 % cream Apply 1 application topically 2 (two) times daily as needed. Avoid face , groin and axilla (Patient not taking: No sig reported) 45 g 0  . ketoconazole (NIZORAL) 2 % cream Apply 1 application topically 2 (two) times daily. (Patient not taking: No sig reported) 60 g 0  . triamcinolone cream (KENALOG) 0.1 % Apply topically 2 (two) times daily. (Patient not taking: No sig reported) 30 g 0   No current facility-administered medications on file prior to visit.    Allergies  Allergen Reactions  . Dapsone Other (See Comments)    Hypoxemia  . Hydrochlorothiazide     History of hyperparathyroidism   . Sulfa Antibiotics Rash  .  Sulfasalazine Rash     Review of Systems ROS Review of Systems - General ROS: negative for - chills, fatigue, fever, hot flashes, night sweats, weight gain or weight loss Psychological ROS: negative for - anxiety, decreased libido, depression, mood swings, physical abuse or sexual abuse Ophthalmic ROS: negative for - blurry vision, eye pain or loss of vision ENT ROS: negative for - headaches, hearing change, visual changes or vocal changes Allergy and Immunology ROS: negative for - hives, itchy/watery eyes  or seasonal allergies Hematological and Lymphatic ROS: negative for - bleeding problems, bruising, swollen lymph nodes or weight loss Endocrine ROS: negative for - galactorrhea, hair pattern changes, hot flashes, malaise/lethargy, mood swings, palpitations, polydipsia/polyuria, skin changes, temperature intolerance or unexpected weight changes Breast ROS: negative for - new or changing breast lumps or nipple discharge Respiratory ROS: negative for - cough or shortness of breath Cardiovascular ROS: negative for - chest pain, irregular heartbeat, palpitations or shortness of breath Gastrointestinal ROS: no abdominal pain, change in bowel habits, or black or bloody stools Genito-Urinary ROS: no dysuria, trouble voiding, or hematuria. Positive for vaginal discharge, itching, dryness (see HPI).  Musculoskeletal ROS: negative for - joint pain or joint stiffness Neurological ROS: negative for - bowel and bladder control changes Dermatological ROS: negative for rash and skin lesion changes   Objective:   BP (!) 142/83   Pulse 84   Ht 5' 4"  (1.626 m)   Wt 193 lb 1.6 oz (87.6 kg)   LMP 03/04/2002 (Approximate)   BMI 33.15 kg/m  CONSTITUTIONAL: Well-developed, well-nourished female in no acute distress.  [place] O2 use.  PSYCHIATRIC: Normal mood and affect. Normal behavior. Normal judgment and thought content. [place]: Alert and oriented to person, place, and time. Normal muscle tone coordination. No cranial nerve deficit noted. HENT:  Normocephalic, atraumatic, External right and left ear normal. Oropharynx is clear and moist EYES: Conjunctivae and EOM are normal. Pupils are equal, round, and reactive to light. No scleral icterus.  NECK: Normal range of motion, supple, no masses.  Normal thyroid.  SKIN: Skin is warm and dry. Several areas of erythema noted under breast folds and pannus.  Not diaphoretic. No erythema. No pallor. CARDIOVASCULAR: Normal heart rate noted, regular rhythm, no  murmur. RESPIRATORY: Clear to auscultation bilaterally. Effort and breath sounds normal, no problems with respiration noted. BREASTS: Symmetric in size. No masses, skin changes, nipple drainage, or lymphadenopathy. ABDOMEN: Soft, normal bowel sounds, no distention noted.  No tenderness, rebound or guarding.  BLADDER: Normal PELVIC:  Bladder no bladder distension noted  Urethra: small urethral caruncle, with no masses, tenderness.  No discharge able to be expressed.   Vulva: No masses, tenderness or lesions and radiation skin changes with petechiae noted on vulva, perineum, and buttock.    Vagina: moderately atrophic. No discharge or lesions present. Grade 1 bladder descensus. No rectocele.   Cervix: Cervix partially flushed to vaginal wall. No apparent lesions. Cervix partially palpable on bimanual exam.   Uterus: difficult to palpate due to body habitus, but does not appear to be enlarged.  Adnexa: normal adnexa in size, nontender and no masses  Rectal exam: negative without mass, lesions or tenderness.  MUSCULOSKELETAL: Normal range of motion. No tenderness.  No cyanosis, clubbing, or edema.  2+ distal pulses. LYMPHATIC: No Axillary, Supraclavicular, or Inguinal Adenopathy.   Labs: Lab Results  Component Value Date   WBC 5.6 04/23/2019   HGB 11.9 04/23/2019   HCT 35.1 04/23/2019   MCV 92.1 04/23/2019   PLT 300  04/23/2019    Lab Results  Component Value Date   CREATININE 0.93 04/23/2019   BUN 18 04/23/2019   NA 145 04/23/2019   K 4.7 04/23/2019   CL 104 04/23/2019   CO2 30 04/23/2019    Lab Results  Component Value Date   ALT 40 (H) 12/12/2019   AST 37 12/12/2019   ALKPHOS 70 12/12/2019   BILITOT 0.3 12/12/2019    Lab Results  Component Value Date   CHOL 85 05/05/2020   HDL 27 (A) 05/05/2020   LDLCALC 78 04/23/2019   TRIG 246 (A) 05/05/2020   CHOLHDL 4.1 04/23/2019    Lab Results  Component Value Date   TSH 2.27 06/18/2020    Lab Results  Component Value  Date   HGBA1C 5.9 (A) 06/19/2020     Assessment:   1. Encounter for well woman exam with routine gynecological exam   2. Skin rash   3. Pap smear for cervical cancer screening   4. History of anal cancer   5. Diabetes mellitus type 2 in obese ([place])   6. Benign hypertension   7. Hypertriglyceridemia   8. Osteopenia of lumbar spine   9. Other skin changes due to chronic exposure to nonionizing radiation   10. Vaginal atrophy      Plan:  1. Pap: Pap Co Test performed today.  Patient with h/o ASCUS HR HPV neg pap with last screen. If normal screen this year, no longer requires further testing.   2. Mammogram: Up to date.  3. Stool Guaiac Testing:  Not Ordered.  Up to date with colon cancer screening. Continues routine f/u with Oncologist. 4. Labs: has performed with PCP. .  5. Routine preventative health maintenance measures emphasized: Exercise/Diet/Weight control and Stress Management. Continue Vitamin D supplementation.  6. HTN, DM, and Hyperlipidemia, managed by PCP.  7. Moderate vaginal atrophy - prescribed local estrogen therapy at last visit however patient apparently no longer taking. Discussed option of OTC remedies for atrophy including vaginal moisturizers or coconut oil.  8. Skin yeast rash - patient notes she has been using hydrocortisone cream on it. Advised to discontinue, prescribed Nystatin cream.  May be secondary to DM if uncontrolled.  9. Flu vaccine up to date.  10. COVID vaccination series: Has completed.  Return to [place], or sooner as needed.    Rubie Maid, MD  [facility]

## 2020-08-28 NOTE — Patient Instructions (Signed)
Health Maintenance After Age 66 After age 2, you are at a higher risk for certain long-term diseases and infections as well as injuries from falls. Falls are a major cause of broken bones and head injuries in people who are older than age 66. Getting regular preventive care can help to keep you healthy and well. Preventive care includes getting regular testing and making lifestyle changes as recommended by your health care provider. Talk with your health care provider about:  Which screenings and tests you should have. A screening is a test that checks for a disease when you have no symptoms.  A diet and exercise plan that is right for you. What should I know about screenings and tests to prevent falls? Screening and testing are the best ways to find a health problem early. Early diagnosis and treatment give you the best chance of managing medical conditions that are common after age 66. Certain conditions and lifestyle choices may make you more likely to have a fall. Your health care provider may recommend:  Regular vision checks. Poor vision and conditions such as cataracts can make you more likely to have a fall. If you wear glasses, make sure to get your prescription updated if your vision changes.  Medicine review. Work with your health care provider to regularly review all of the medicines you are taking, including over-the-counter medicines. Ask your health care provider about any side effects that may make you more likely to have a fall. Tell your health care provider if any medicines that you take make you feel dizzy or sleepy.  Osteoporosis screening. Osteoporosis is a condition that causes the bones to get weaker. This can make the bones weak and cause them to break more easily.  Blood pressure screening. Blood pressure changes and medicines to control blood pressure can make you feel dizzy.  Strength and balance checks. Your health care provider may recommend certain tests to check your  strength and balance while standing, walking, or changing positions.  Foot health exam. Foot pain and numbness, as well as not wearing proper footwear, can make you more likely to have a fall.  Depression screening. You may be more likely to have a fall if you have a fear of falling, feel emotionally low, or feel unable to do activities that you used to do.  Alcohol use screening. Using too much alcohol can affect your balance and may make you more likely to have a fall. What actions can I take to lower my risk of falls? General instructions  Talk with your health care provider about your risks for falling. Tell your health care provider if: ? You fall. Be sure to tell your health care provider about all falls, even ones that seem minor. ? You feel dizzy, sleepy, or off-balance.  Take over-the-counter and prescription medicines only as told by your health care provider. These include any supplements.  Eat a healthy diet and maintain a healthy weight. A healthy diet includes low-fat dairy products, low-fat (lean) meats, and fiber from whole grains, beans, and lots of fruits and vegetables. Home safety  Remove any tripping hazards, such as rugs, cords, and clutter.  Install safety equipment such as grab bars in bathrooms and safety rails on stairs.  Keep rooms and walkways well-lit. Activity  Follow a regular exercise program to stay fit. This will help you maintain your balance. Ask your health care provider what types of exercise are appropriate for you.  If you need a cane or walker,  use it as recommended by your health care provider.  Wear supportive shoes that have nonskid soles.   Lifestyle  Do not drink alcohol if your health care provider tells you not to drink.  If you drink alcohol, limit how much you have: ? 0-1 drink a day for women. ? 0-2 drinks a day for men.  Be aware of how much alcohol is in your drink. In the [place] one drink equals one typical bottle of beer (12  oz), one-half glass of wine (5 oz), or one shot of hard liquor (1 oz).  Do not use any products that contain nicotine or tobacco, such as cigarettes and e-cigarettes. If you need help quitting, ask your health care provider. Summary  Having a healthy lifestyle and getting preventive care can help to protect your health and wellness after age 66.  Screening and testing are the best way to find a health problem early and help you avoid having a fall. Early diagnosis and treatment give you the best chance for managing medical conditions that are more common for people who are older than age 66.  Falls are a major cause of broken bones and head injuries in people who are older than age 66. Take precautions to prevent a fall at home.  Work with your health care provider to learn what changes you can make to improve your health and wellness and to prevent falls. This information is not intended to replace advice given to you by your health care provider. Make sure you discuss any questions you have with your health care provider. Document Revised: 11/16/2018 Document Reviewed: 06/08/2017 Elsevier Patient Education  2021 [place].     Skin Yeast Infection  A skin yeast infection is a condition in which there is an overgrowth of yeast (candida) that normally lives on the skin. This condition usually occurs in areas of the skin that are constantly warm and moist, such as the armpits or the groin. What are the causes? This condition is caused by a change in the normal balance of the yeast and bacteria that live on the skin. What increases the risk? You are more likely to develop this condition if you:  Are obese.  Are pregnant.  Take birth control pills.  Have diabetes.  Take antibiotic medicines.  Take steroid medicines.  Are malnourished.  Have a weak body defense system (immune system).  Are 66 years of age or older.  Wear tight clothing. What are the signs or  symptoms? The most common symptom of this condition is itchiness in the affected area. Other symptoms include:  Red, swollen area of the skin.  Bumps on the skin. How is this diagnosed?  This condition is diagnosed with a medical history and physical exam.  Your health care provider may check for yeast by taking light scrapings of the skin to be viewed under a microscope. How is this treated? This condition is treated with medicine. Medicines may be prescribed or be available over the counter. The medicines may be:  Taken by mouth (orally).  Applied as a cream or powder to your skin. Follow these instructions at home:  Take or apply over-the-counter and prescription medicines only as told by your health care provider.  Maintain a healthy weight. If you need help losing weight, talk with your health care provider.  Keep your skin clean and dry.  If you have diabetes, keep your blood sugar under control.  Keep all follow-up visits as told by your health  care provider. This is important.   Contact a health care provider if:  Your symptoms go away and then return.  Your symptoms do not get better with treatment.  Your symptoms get worse.  Your rash spreads.  You have a fever or chills.  You have new symptoms.  You have new warmth or redness of your skin. Summary  A skin yeast infection is a condition in which there is an overgrowth of yeast (candida) that normally lives on the skin. This condition is caused by a change in the normal balance of the yeast and bacteria that live on the skin.  Take or apply over-the-counter and prescription medicines only as told by your health care provider.  Keep your skin clean and dry.  Contact a health care provider if your symptoms do not get better with treatment. This information is not intended to replace advice given to you by your health care provider. Make sure you discuss any questions you have with your health care  provider. Document Revised: 12/13/2017 Document Reviewed: 12/13/2017 Elsevier Patient Education  2021 [place] Breast self-awareness means being familiar with how your breasts look and feel. It involves checking your breasts regularly and reporting any changes to your health care provider. Practicing breast self-awareness is important. Sometimes changes may not be harmful (are benign), but sometimes a change in your breasts can be a sign of a serious medical problem. It is important to learn how to do this procedure correctly so that you can catch problems early, when treatment is more likely to be successful. All women should practice breast self-awareness, including women who have had breast implants. What you need:  A mirror.  A well-lit room. How to do a breast self-exam A breast self-exam is one way to learn what is normal for your breasts and whether your breasts are changing. To do a breast self-exam: Look for changes 1. Remove all the clothing above your waist. 2. Stand in front of a mirror in a room with good lighting. 3. Put your hands on your hips. 4. Push your hands firmly downward. 5. Compare your breasts in the mirror. Look for differences between them (asymmetry), such as: ? Differences in shape. ? Differences in size. ? Puckers, dips, and bumps in one breast and not the other. 6. Look at each breast for changes in the skin, such as: ? Redness. ? Scaly areas. 7. Look for changes in your nipples, such as: ? Discharge. ? Bleeding. ? Dimpling. ? Redness. ? A change in position.   Feel for changes Carefully feel your breasts for lumps and changes. It is best to do this while lying on your back on the floor, and again while sitting or standing in the tub or shower with soapy water on your skin. Feel each breast in the following way: 1. Place the arm on the side of the breast you are examining above your head. 2. Feel your breast with the other  hand. 3. Start in the nipple area and make -inch (2 cm) overlapping circles to feel your breast. Use the pads of your three middle fingers to do this. Apply light pressure, then medium pressure, then firm pressure. The light pressure will allow you to feel the tissue closest to the skin. The medium pressure will allow you to feel the tissue that is a little deeper. The firm pressure will allow you to feel the tissue close to the ribs. 4. Continue the overlapping circles, moving downward  over the breast until you feel your ribs below your breast. 5. Move one finger-width toward the center of the body. Continue to use the -inch (2 cm) overlapping circles to feel your breast as you move slowly up toward your collarbone. 6. Continue the up-and-down exam using all three pressures until you reach your armpit.   Write down what you find Writing down what you find can help you remember what to discuss with your health care provider. Write down:  What is normal for each breast.  Any changes that you find in each breast, including: ? The kind of changes you find. ? Any pain or tenderness. ? Size and location of any lumps.  Where you are in your menstrual cycle, if you are still menstruating. General tips and recommendations  Examine your breasts every month.  If you are breastfeeding, the best time to examine your breasts is after a feeding or after using a breast pump.  If you menstruate, the best time to examine your breasts is 5-7 days after your period. Breasts are generally lumpier during menstrual periods, and it may be more difficult to notice changes.  With time and practice, you will become more familiar with the variations in your breasts and more comfortable with the exam. Contact a health care provider if you:  See a change in the shape or size of your breasts or nipples.  See a change in the skin of your breast or nipples, such as a reddened or scaly area.  Have unusual discharge  from your nipples.  Find a lump or thick area that was not there before.  Have pain in your breasts.  Have any concerns related to your breast health. Summary  Breast self-awareness includes looking for physical changes in your breasts, as well as feeling for any changes within your breasts.  Breast self-awareness should be performed in front of a mirror in a well-lit room.  You should examine your breasts every month. If you menstruate, the best time to examine your breasts is 5-7 days after your menstrual period.  Let your health care provider know of any changes you notice in your breasts, including changes in size, changes on the skin, pain or tenderness, or unusual fluid from your nipples. This information is not intended to replace advice given to you by your health care provider. Make sure you discuss any questions you have with your health care provider. Document Revised: 03/14/2018 Document Reviewed: 03/14/2018 Elsevier Patient Education  [place].

## 2020-08-28 NOTE — Progress Notes (Signed)
Pt present for annual exam. Pt stated having vaginal itching, discharge and dryness.

## 2020-08-29 ENCOUNTER — Other Ambulatory Visit: Payer: Self-pay | Admitting: Family Medicine

## 2020-08-29 DIAGNOSIS — E1169 Type 2 diabetes mellitus with other specified complication: Secondary | ICD-10-CM

## 2020-08-29 DIAGNOSIS — F411 Generalized anxiety disorder: Secondary | ICD-10-CM

## 2020-08-29 DIAGNOSIS — I479 Paroxysmal tachycardia, unspecified: Secondary | ICD-10-CM

## 2020-08-29 DIAGNOSIS — I10 Essential (primary) hypertension: Secondary | ICD-10-CM

## 2020-08-29 DIAGNOSIS — F339 Major depressive disorder, recurrent, unspecified: Secondary | ICD-10-CM

## 2020-08-31 ENCOUNTER — Encounter: Payer: Self-pay | Admitting: Student in an Organized Health Care Education/Training Program

## 2020-09-01 LAB — CYTOLOGY - PAP
Comment: NEGATIVE
Diagnosis: NEGATIVE
High risk HPV: NEGATIVE

## 2020-09-09 DIAGNOSIS — C211 Malignant neoplasm of anal canal: Secondary | ICD-10-CM | POA: Diagnosis not present

## 2020-09-09 DIAGNOSIS — R0902 Hypoxemia: Secondary | ICD-10-CM | POA: Diagnosis not present

## 2020-09-09 DIAGNOSIS — I270 Primary pulmonary hypertension: Secondary | ICD-10-CM | POA: Diagnosis not present

## 2020-09-11 ENCOUNTER — Encounter: Payer: Self-pay | Admitting: Family Medicine

## 2020-09-12 ENCOUNTER — Other Ambulatory Visit: Payer: Self-pay | Admitting: Family Medicine

## 2020-09-12 ENCOUNTER — Other Ambulatory Visit: Payer: Self-pay

## 2020-09-12 DIAGNOSIS — F339 Major depressive disorder, recurrent, unspecified: Secondary | ICD-10-CM

## 2020-09-12 MED ORDER — ARIPIPRAZOLE 10 MG PO TABS
10.0000 mg | ORAL_TABLET | Freq: Every day | ORAL | 0 refills | Status: DC
Start: 2020-09-12 — End: 2020-10-17

## 2020-09-15 ENCOUNTER — Other Ambulatory Visit: Payer: Self-pay | Admitting: Family Medicine

## 2020-09-15 DIAGNOSIS — R2232 Localized swelling, mass and lump, left upper limb: Secondary | ICD-10-CM

## 2020-09-22 MED ORDER — FLUCONAZOLE 100 MG PO TABS: 100.0000 mg | ORAL_TABLET | Freq: Every day | ORAL | 0 refills | Status: DC

## 2020-09-25 DIAGNOSIS — H353132 Nonexudative age-related macular degeneration, bilateral, intermediate dry stage: Secondary | ICD-10-CM | POA: Diagnosis not present

## 2020-09-26 DIAGNOSIS — Z9981 Dependence on supplemental oxygen: Secondary | ICD-10-CM | POA: Diagnosis not present

## 2020-09-26 DIAGNOSIS — I10 Essential (primary) hypertension: Secondary | ICD-10-CM | POA: Diagnosis not present

## 2020-09-26 DIAGNOSIS — J9611 Chronic respiratory failure with hypoxia: Secondary | ICD-10-CM | POA: Diagnosis not present

## 2020-09-26 DIAGNOSIS — E213 Hyperparathyroidism, unspecified: Secondary | ICD-10-CM | POA: Diagnosis not present

## 2020-10-03 DIAGNOSIS — H6012 Cellulitis of left external ear: Secondary | ICD-10-CM | POA: Diagnosis not present

## 2020-10-07 DIAGNOSIS — R0902 Hypoxemia: Secondary | ICD-10-CM | POA: Diagnosis not present

## 2020-10-07 DIAGNOSIS — I270 Primary pulmonary hypertension: Secondary | ICD-10-CM | POA: Diagnosis not present

## 2020-10-07 DIAGNOSIS — C211 Malignant neoplasm of anal canal: Secondary | ICD-10-CM | POA: Diagnosis not present

## 2020-10-15 NOTE — Progress Notes (Signed)
Name: Judy Allen   MRN: [id number]    DOB: Dec 25, 1955   Date:10/17/2020       Progress Note  Subjective  Chief Complaint  Follow up   HPI    Hypertriglyceridemia and hyperparathyrodism: seeing Dr. Maylon Peppers last labs done in her office 04/2020 . Currently taking Crestor , Lovaza and Gemfibrozil , denies side effects. Last Pth was 75   HTN/Paroxysmal tachycardia: . She is currently taking Norvasc 2.5 , Telmisartan 80 one and half daily , Metoprolol 75  mg , she is under the care of Dr. Smith Mince - nephrologist, she has been checking bp at home a few times a week. BP seems lower in the pm, advised to split the doses between morning and pm, likely one telmisartan in am and half at night , half metoprolol in am and one at night   MDD: she is now back on 225 mg of Effexor and Abilify.  Off Depakote and Wellbutrin because of high bp. She is not doing well this week, her husband will be dead 3 years 2023-01-08 th. She states she has noticed lack of motivation. She has been having a dream that she is chocking , and gets up breathing heavily, no swelling, no orthopnea. She feels afraid when it happens, explained it may be panic attack. She has noticed mild tremors on arms, left worse than right, discussed going down on Abilify dose but she wants to hold off for now   Hyperparathyroidism:calciumhad goneback to normal since off hctz. Monitored by nephrologist and endocrinologist.   Emphysema and hypoxemia:on oxygendown to 2liters - 24 hours per day sob is stable, no cough , but has intermittent wheezing. .Under the care of pulmonologist at [facility], waking up during the night, wears CPAP machine, advised to contact pulmonologist   History of anal cancer: still going to [facility] yearly for radiation oncologist. She denies change in bowel movements of blood in stools, she was recently seen by Dr. Bonna Gains for elevation of liver enzymes , had multiple tests and since imaging stable.   DMII:  diagnosed 02/2018 by Dr. Gabriel Carina, hgbA1C was at 6.6%, A1C is down, she has been off Metformin. She has diabetes neuropathy - pain on both feet  .She also has dyslipidemia. She does not follow a diabetic diet   Chronic pain: under the care of Dr. Tiburcio Bash states she is doing well on Gabapentin , Tramadol,also taking muscle relaxer. Pain is worse on her neck, but also has back pain, history of laminectomy , she states pain is stable.   Senile purpura: she states not as bad now, still bruises on both arms. Stable.   Psoriasis: used to see Dr Phillip Heal, recently given Diflucan and nystatin by gyn for rash, however explained likely inverted psoriasis lesions, advised to follow up with Dermatologist, she would like to go to a local doctor, she lives in [place]   Patient Active Problem List   Diagnosis Date Noted  . Hypoxia 08/25/2019  . Hyperparathyroidism ([place]) 08/25/2019  . Diabetes mellitus type 2 in obese ([place]) 11/06/2018  . Cervical radiculopathy (left) 08/31/2018  . Osteoarthritis of spine with radiculopathy, cervical region 08/31/2018  . History of anal cancer 05/30/2018  . Myofascial pain syndrome, cervical 05/16/2018  . Chronic pain syndrome 05/16/2018  . Cervical facet joint syndrome 05/16/2018  . Perihepatitis ([place]) 09/27/2017  . Abnormal liver function test 09/27/2017  . Fatty liver 12/22/2016  . Chronic respiratory failure with hypoxia, on home oxygen therapy ([place]) 07/28/2016  . Centrilobular emphysema (  [place]) 07/14/2016  . Abnormal Pap smear of cervix 04/14/2016  . Anal squamous cell carcinoma ([place]) 03/09/2016  . Non-thrombocytopenic purpura ([place]) 01/12/2016  . Chronic constipation 10/16/2015  . History of fusion of cervical spine 04/02/2015  . Benign hypertension 01/16/2015  . Cervicalgia 01/16/2015  . CN (constipation) 01/16/2015  . Gastric reflux 01/16/2015  . Hypertriglyceridemia 01/16/2015  . Edema leg 01/16/2015  . Chronic recurrent major depressive disorder ([place])  01/16/2015  . Dysmetabolic syndrome 63/08/6008  . Obstructive apnea 01/16/2015  . Psoriasis 01/16/2015  . Allergic rhinitis 01/16/2015  . Bursitis, trochanteric 01/16/2015  . GAD (generalized anxiety disorder) 01/16/2015  . Tachycardia, paroxysmal [facility])     Past Surgical History:  Procedure Laterality Date  . ANTERIOR FUSION LUMBAR SPINE  1990   plate in back, not metal  . CARDIAC CATHETERIZATION  2009?    [facility]  . CARDIAC CATHETERIZATION  05/2012   [facility]: Mild hypertension 37/12 with a mean pressure of 21 mm mercury  . CHOLECYSTECTOMY N/A 09/12/2017   Procedure: LAPAROSCOPIC CHOLECYSTECTOMY WITH INTRAOPERATIVE CHOLANGIOGRAM;  Surgeon: Robert Bellow, MD;  Location: [facility] ORS;  Service: General;  Laterality: N/A;  . COLONOSCOPY  2018  . herniated disc repair  2001  . LIVER BIOPSY N/A 09/12/2017   Procedure: LIVER BIOPSY;  Surgeon: Robert Bellow, MD;  Location: [facility] ORS;  Service: General;  Laterality: N/A;  . MASS EXCISION Left 02/23/2016   Procedure: EXCISION MASS;  Surgeon: Christene Lye, MD;  Location: [facility] ORS;  Service: General;  Laterality: Left;  . NECK SURGERY    . PORT-A-CATH REMOVAL  2018  . PORTACATH PLACEMENT Left 03/12/2016   Procedure: INSERTION PORT-A-CATH;  Surgeon: Christene Lye, MD;  Location: [facility] ORS;  Service: General;  Laterality: Left;  . SPINE SURGERY    . tonsillectomy  1959   history  . TRIGGER FINGER RELEASE Right 09/2018   Dr. Sabra Heck   . TUBAL LIGATION     s/p    Family History  Problem Relation Age of Onset  . Heart attack Mother   . Asthma Mother   . Liver cancer Father   . Breast cancer Maternal Aunt   . Bladder Cancer Neg Hx   . Kidney cancer Neg Hx   . Colon cancer Neg Hx     Social History   Tobacco Use  . Smoking status: Former Smoker    Packs/day: 1.00    Years: 32.00    Pack years: 32.00    Types: Cigarettes    Start date: 08/10/1963    Quit date: 01/15/2006    Years since quitting: 14.7  . Smokeless  tobacco: Never Used  Substance Use Topics  . Alcohol use: No    Alcohol/week: 0.0 standard drinks     Current Outpatient Medications:  .  albuterol (PROVENTIL HFA;VENTOLIN HFA) 108 (90 Base) MCG/ACT inhaler, Inhale 2 puffs into the lungs every 6 (six) hours as needed for wheezing or shortness of breath., Disp: , Rfl:  .  amLODipine (NORVASC) 2.5 MG tablet, Take 1 tablet (2.5 mg total) by mouth daily., Disp: 90 tablet, Rfl: 1 .  ANORO ELLIPTA 62.5-25 MCG/INH AEPB, INL 1 PUFF PO D, Disp: , Rfl:  .  ARIPiprazole (ABILIFY) 10 MG tablet, Take 1 tablet (10 mg total) by mouth daily., Disp: 90 tablet, Rfl: 1 .  blood glucose meter kit and supplies, Dispense based on patient and insurance preference. Use up to four times daily as directed. (FOR ICD-10 E10.9, E11.9)., Disp: 1 each,  Rfl: 0 .  CONTOUR NEXT TEST test strip, USE UP TO FOUR TIMES DAILY AS DIRECTED, Disp: 100 each, Rfl: 5 .  fluconazole (DIFLUCAN) 100 MG tablet, Take 1 tablet (100 mg total) by mouth daily. Take 1 tablet twice daily on Day #1, then 1 tablet daily for next 6 days., Disp: 8 tablet, Rfl: 0 .  fluticasone (FLONASE) 50 MCG/ACT nasal spray, Place 2 sprays into both nostrils daily., Disp: 16 g, Rfl: 2 .  gabapentin (NEURONTIN) 300 MG capsule, TAKE 1 CAPSULE(300 MG) BY MOUTH THREE TIMES DAILY, Disp: 90 capsule, Rfl: 5 .  gemfibrozil (LOPID) 600 MG tablet, Take 600 mg by mouth 2 (two) times daily., Disp: , Rfl:  .  hydrocortisone 2.5 % cream, Apply topically 2 (two) times daily., Disp: 30 g, Rfl: 0 .  metoprolol succinate (TOPROL-XL) 50 MG 24 hr tablet, Take 1 tablet (50 mg total) by mouth daily. with food, Disp: 90 tablet, Rfl: 1 .  Microlet Lancets MISC, USE FOUR TIMES DAILY, Disp: 100 each, Rfl: 5 .  nystatin cream (MYCOSTATIN), Apply 1 application topically 2 (two) times daily., Disp: 30 g, Rfl: 1 .  omega-3 acid ethyl esters (LOVAZA) 1 g capsule, Take 2 capsules (2 g total) by mouth 2 (two) times daily., Disp: 360 capsule, Rfl:  1 .  omeprazole (PRILOSEC) 40 MG capsule, Take 1 capsule (40 mg total) by mouth daily., Disp: 90 capsule, Rfl: 1 .  OXYGEN, Inhale 2 L into the lungs continuous. , Disp: , Rfl:  .  rosuvastatin (CRESTOR) 5 MG tablet, Take 1 tablet (5 mg total) by mouth daily., Disp: 90 tablet, Rfl: 1 .  telmisartan (MICARDIS) 80 MG tablet, Take 80 mg by mouth daily., Disp: , Rfl:  .  tiZANidine (ZANAFLEX) 4 MG tablet, Take 1 tablet (4 mg total) by mouth every 8 (eight) hours as needed., Disp: 90 tablet, Rfl: 5 .  torsemide (DEMADEX) 5 MG tablet, Take 5 mg by mouth daily., Disp: , Rfl:  .  traMADol (ULTRAM) 50 MG tablet, Take 1 tablet (50 mg total) by mouth every 6 (six) hours as needed for severe pain. Each fill must last 30 days, Disp: 120 tablet, Rfl: 5 .  venlafaxine XR (EFFEXOR-XR) 75 MG 24 hr capsule, Take 3 capsules (225 mg total) by mouth daily with breakfast., Disp: 270 capsule, Rfl: 1 .  Vitamin D, Ergocalciferol, (DRISDOL) 1.25 MG (50000 UT) CAPS capsule, Take 1 capsule (50,000 Units total) by mouth every 7 (seven) days., Disp: 12 capsule, Rfl: 1  Allergies  Allergen Reactions  . Dapsone Other (See Comments)    Hypoxemia  . Hydrochlorothiazide     History of hyperparathyroidism   . Sulfa Antibiotics Rash  . Sulfasalazine Rash    I personally reviewed active problem list, medication list, allergies, family history, social history, health maintenance with the patient/caregiver today.   ROS  Constitutional: Negative for fever or weight change.  Respiratory: Negative for cough and shortness of breath.   Cardiovascular: Negative for chest pain or palpitations.  Gastrointestinal: Negative for abdominal pain, no bowel changes.  Musculoskeletal: Negative for gait problem or joint swelling.  Skin: positive for rash.  Neurological: Negative for dizziness or headache.  No other specific complaints in a complete review of systems (except as listed in HPI above).  Objective  Vitals:   10/17/20 0845   BP: 132/86  Pulse: 80  Resp: 16  Temp: 98 F (36.7 C)  TempSrc: Oral  SpO2: 98%  Weight: 188 lb (85.3 kg)  Height: 5' 4"  (1.626 m)    Body mass index is 32.27 kg/m.  Physical Exam  Constitutional: Patient appears well-developed and well-nourished. Obese  No distress.  HEENT: head atraumatic, normocephalic, pupils equal and reactive to light,  neck supple Cardiovascular: Normal rate, regular rhythm and normal heart sounds.  No murmur heard. No BLE edema. Pulmonary/Chest: Effort normal and breath sounds normal. No respiratory distress. Abdominal: Soft.  There is no tenderness. Skin: erythematous rash on abdominal folds, also some plaques on legs and arms  Psychiatric: Patient has a normal mood and affect. behavior is normal. Judgment and thought content normal.  Recent Results (from the past 2160 hour(s))  Cytology - PAP     Status: None   Collection Time: 08/28/20 10:20 AM  Result Value Ref Range   High risk HPV Negative    Adequacy      Satisfactory for evaluation. The presence or absence of an   Adequacy      endocervical/transformation zone component cannot be determined because   Adequacy of atrophy.    Diagnosis      - Negative for intraepithelial lesion or malignancy (NILM)   Comment Normal Reference Range HPV - Negative   POCT HgB A1C     Status: Abnormal   Collection Time: 10/17/20  9:00 AM  Result Value Ref Range   Hemoglobin A1C 5.8 (A) 4.0 - 5.6 %   HbA1c POC (<> result, manual entry)     HbA1c, POC (prediabetic range)     HbA1c, POC (controlled diabetic range)        PHQ2/9: Depression screen [facility] 2/9 10/17/2020 07/31/2020 06/19/2020 06/19/2020 05/13/2020  Decreased Interest 3 1 3 3  0  Down, Depressed, Hopeless 3 1 3  - 0  PHQ - 2 Score 6 2 6 3  0  Altered sleeping 0 0 0 - -  Tired, decreased energy 3 1 2  - -  Change in appetite 0 0 0 - -  Feeling bad or failure about yourself  1 1 3  - -  Trouble concentrating 0 0 0 - -  Moving slowly or  fidgety/restless 0 1 0 - -  Suicidal thoughts 0 0 0 - -  PHQ-9 Score 10 5 11  - -  Difficult doing work/chores - Not difficult at all - - -  Some recent data might be hidden    phq 9 is positive   Fall Risk: Fall Risk  10/17/2020 07/31/2020 06/19/2020 05/13/2020 03/19/2020  Falls in the past year? 0 0 0 0 0  Number falls in past yr: 0 0 0 - 0  Injury with Fall? 0 0 0 - 0  Risk for fall due to : - No Fall Risks - - -  Follow up - Falls prevention discussed - - -     Functional Status Survey: Is the patient deaf or have difficulty hearing?: No Does the patient have difficulty seeing, even when wearing glasses/contacts?: No Does the patient have difficulty concentrating, remembering, or making decisions?: No Does the patient have difficulty walking or climbing stairs?: No Does the patient have difficulty dressing or bathing?: No Does the patient have difficulty doing errands alone such as visiting a doctor's office or shopping?: No    Assessment & Plan  1. Dyslipidemia associated with type 2 diabetes mellitus (HCC)  - POCT HgB A1C - rosuvastatin (CRESTOR) 5 MG tablet; Take 1 tablet (5 mg total) by mouth daily.  Dispense: 90 tablet; Refill: 1  2. Chronic recurrent major depressive disorder ([place])  -  venlafaxine XR (EFFEXOR-XR) 75 MG 24 hr capsule; Take 3 capsules (225 mg total) by mouth daily with breakfast.  Dispense: 270 capsule; Refill: 1 - ARIPiprazole (ABILIFY) 10 MG tablet; Take 1 tablet (10 mg total) by mouth daily.  Dispense: 90 tablet; Refill: 1  3. GAD (generalized anxiety disorder)  - venlafaxine XR (EFFEXOR-XR) 75 MG 24 hr capsule; Take 3 capsules (225 mg total) by mouth daily with breakfast.  Dispense: 270 capsule; Refill: 1  4. Benign hypertension  - amLODipine (NORVASC) 2.5 MG tablet; Take 1 tablet (2.5 mg total) by mouth daily.  Dispense: 90 tablet; Refill: 1 - metoprolol succinate (TOPROL-XL) 50 MG 24 hr tablet; Take 1 tablet (50 mg total) by mouth daily. with  food  Dispense: 90 tablet; Refill: 1  5. Tachycardia, paroxysmal (HCC)  - metoprolol succinate (TOPROL-XL) 50 MG 24 hr tablet; Take 1 tablet (50 mg total) by mouth daily. with food  Dispense: 90 tablet; Refill: 1  6. Hypertriglyceridemia  - omega-3 acid ethyl esters (LOVAZA) 1 g capsule; Take 2 capsules (2 g total) by mouth 2 (two) times daily.  Dispense: 360 capsule; Refill: 1  7. Senile purpura ([place])   8. Psoriasis  - Ambulatory referral to Dermatology  9. Hyperparathyroidism ([place])  Sees Dr. Gabriel Carina   10. Centrilobular emphysema ([place])  Sees Pulmonologist   11. Chronic neck pain   12. GERD without esophagitis  Controlled  13. Vitamin D deficiency

## 2020-10-17 ENCOUNTER — Other Ambulatory Visit: Payer: Self-pay

## 2020-10-17 ENCOUNTER — Encounter: Payer: Self-pay | Admitting: Family Medicine

## 2020-10-17 ENCOUNTER — Ambulatory Visit (INDEPENDENT_AMBULATORY_CARE_PROVIDER_SITE_OTHER): Payer: Medicare Other | Admitting: Family Medicine

## 2020-10-17 VITALS — BP 132/86 | HR 80 | Temp 98.0°F | Resp 16 | Ht 64.0 in | Wt 188.0 lb

## 2020-10-17 DIAGNOSIS — E781 Pure hyperglyceridemia: Secondary | ICD-10-CM | POA: Diagnosis not present

## 2020-10-17 DIAGNOSIS — K219 Gastro-esophageal reflux disease without esophagitis: Secondary | ICD-10-CM | POA: Diagnosis not present

## 2020-10-17 DIAGNOSIS — L409 Psoriasis, unspecified: Secondary | ICD-10-CM | POA: Diagnosis not present

## 2020-10-17 DIAGNOSIS — E785 Hyperlipidemia, unspecified: Secondary | ICD-10-CM | POA: Diagnosis not present

## 2020-10-17 DIAGNOSIS — D692 Other nonthrombocytopenic purpura: Secondary | ICD-10-CM | POA: Diagnosis not present

## 2020-10-17 DIAGNOSIS — I479 Paroxysmal tachycardia, unspecified: Secondary | ICD-10-CM

## 2020-10-17 DIAGNOSIS — J432 Centrilobular emphysema: Secondary | ICD-10-CM | POA: Diagnosis not present

## 2020-10-17 DIAGNOSIS — E213 Hyperparathyroidism, unspecified: Secondary | ICD-10-CM

## 2020-10-17 DIAGNOSIS — G8929 Other chronic pain: Secondary | ICD-10-CM

## 2020-10-17 DIAGNOSIS — I10 Essential (primary) hypertension: Secondary | ICD-10-CM

## 2020-10-17 DIAGNOSIS — M542 Cervicalgia: Secondary | ICD-10-CM | POA: Diagnosis not present

## 2020-10-17 DIAGNOSIS — E559 Vitamin D deficiency, unspecified: Secondary | ICD-10-CM

## 2020-10-17 DIAGNOSIS — F411 Generalized anxiety disorder: Secondary | ICD-10-CM | POA: Diagnosis not present

## 2020-10-17 DIAGNOSIS — F339 Major depressive disorder, recurrent, unspecified: Secondary | ICD-10-CM | POA: Diagnosis not present

## 2020-10-17 DIAGNOSIS — E1169 Type 2 diabetes mellitus with other specified complication: Secondary | ICD-10-CM | POA: Diagnosis not present

## 2020-10-17 LAB — POCT GLYCOSYLATED HEMOGLOBIN (HGB A1C): Hemoglobin A1C: 5.8 % — AB (ref 4.0–5.6)

## 2020-10-17 MED ORDER — ARIPIPRAZOLE 10 MG PO TABS
10.0000 mg | ORAL_TABLET | Freq: Every day | ORAL | 1 refills | Status: DC
Start: 2020-10-17 — End: 2021-07-21

## 2020-10-17 MED ORDER — VENLAFAXINE HCL ER 75 MG PO CP24
225.0000 mg | ORAL_CAPSULE | Freq: Every day | ORAL | 1 refills | Status: DC
Start: 2020-10-17 — End: 2021-03-10

## 2020-10-17 MED ORDER — OMEGA-3-ACID ETHYL ESTERS 1 G PO CAPS: 2.0000 g | ORAL_CAPSULE | Freq: Two times a day (BID) | ORAL | 1 refills | Status: DC

## 2020-10-17 MED ORDER — AMLODIPINE BESYLATE 2.5 MG PO TABS: 2.5000 mg | ORAL_TABLET | Freq: Every day | ORAL | 1 refills | Status: DC

## 2020-10-17 MED ORDER — METOPROLOL SUCCINATE ER 50 MG PO TB24
50.0000 mg | ORAL_TABLET | Freq: Every day | ORAL | 1 refills | Status: DC
Start: 2020-10-17 — End: 2021-01-20

## 2020-10-17 MED ORDER — ROSUVASTATIN CALCIUM 5 MG PO TABS: 5.0000 mg | ORAL_TABLET | Freq: Every day | ORAL | 1 refills | Status: DC

## 2020-11-07 DIAGNOSIS — I270 Primary pulmonary hypertension: Secondary | ICD-10-CM | POA: Diagnosis not present

## 2020-11-07 DIAGNOSIS — C211 Malignant neoplasm of anal canal: Secondary | ICD-10-CM | POA: Diagnosis not present

## 2020-11-07 DIAGNOSIS — R0902 Hypoxemia: Secondary | ICD-10-CM | POA: Diagnosis not present

## 2020-11-11 ENCOUNTER — Other Ambulatory Visit: Payer: Self-pay

## 2020-11-11 ENCOUNTER — Encounter: Payer: Self-pay | Admitting: Student in an Organized Health Care Education/Training Program

## 2020-11-11 ENCOUNTER — Encounter: Payer: Self-pay | Admitting: Family Medicine

## 2020-11-11 ENCOUNTER — Ambulatory Visit
Payer: Medicare Other | Attending: Student in an Organized Health Care Education/Training Program | Admitting: Student in an Organized Health Care Education/Training Program

## 2020-11-11 VITALS — BP 107/69 | HR 77 | Temp 96.5°F | Resp 18 | Ht 64.0 in | Wt 188.0 lb

## 2020-11-11 DIAGNOSIS — M542 Cervicalgia: Secondary | ICD-10-CM

## 2020-11-11 DIAGNOSIS — G894 Chronic pain syndrome: Secondary | ICD-10-CM | POA: Diagnosis not present

## 2020-11-11 DIAGNOSIS — G8929 Other chronic pain: Secondary | ICD-10-CM | POA: Diagnosis not present

## 2020-11-11 DIAGNOSIS — L409 Psoriasis, unspecified: Secondary | ICD-10-CM

## 2020-11-11 DIAGNOSIS — M5416 Radiculopathy, lumbar region: Secondary | ICD-10-CM

## 2020-11-11 DIAGNOSIS — M533 Sacrococcygeal disorders, not elsewhere classified: Secondary | ICD-10-CM

## 2020-11-11 DIAGNOSIS — M5412 Radiculopathy, cervical region: Secondary | ICD-10-CM | POA: Diagnosis not present

## 2020-11-11 DIAGNOSIS — M47818 Spondylosis without myelopathy or radiculopathy, sacral and sacrococcygeal region: Secondary | ICD-10-CM

## 2020-11-11 MED ORDER — TIZANIDINE HCL 4 MG PO TABS: 4.0000 mg | ORAL_TABLET | Freq: Three times a day (TID) | ORAL | 1 refills | Status: DC | PRN

## 2020-11-11 MED ORDER — TRAMADOL HCL 50 MG PO TABS: 50.0000 mg | ORAL_TABLET | Freq: Four times a day (QID) | ORAL | 5 refills | Status: DC | PRN

## 2020-11-11 MED ORDER — GABAPENTIN 300 MG PO CAPS: ORAL_CAPSULE | ORAL | 1 refills | Status: DC

## 2020-11-11 NOTE — Progress Notes (Signed)
PROVIDER NOTE: Information contained herein reflects review and annotations entered in association with encounter. Interpretation of such information and data should be left to medically-trained personnel. Information provided to patient can be located elsewhere in the medical record under "Patient Instructions". Document created using STT-dictation technology, any transcriptional errors that may result from process are unintentional.    Patient: Judy Allen  Service Category: E/M  Provider: Gillis Santa, MD  DOB: 03/12/1956  DOS: 11/11/2020  Specialty: Interventional Pain Management  MRN: [id number]  Setting: Ambulatory outpatient  PCP: Steele Sizer, MD  Type: Established Patient    Referring Provider: Steele Sizer, MD  Location: Office  Delivery: Face-to-face     HPI  Ms. CADYN RODGER, a 65 y.o. year old female, is here today because of her Chronic pain syndrome [G89.4]. Ms. Stantz primary complain today is Neck Pain (More on left)  Pertinent problems: Ms. Birkland has Chronic recurrent major depressive disorder ([place]); GAD (generalized anxiety disorder); Myofascial pain syndrome, cervical; Chronic pain syndrome; Cervical facet joint syndrome; Cervical radiculopathy (left); and Osteoarthritis of spine with radiculopathy, cervical region on their pertinent problem list. Pain Assessment: Severity of Chronic pain is reported as a 2 /10. Location: Neck Left/radiates down left arm ro fingertips, all but the thumb. Onset: More than a month ago. Quality: Numbness,Tingling,Sharp. Timing: Intermittent. Modifying factor(s): medictions. Vitals:  height is 5' 4"  (1.626 m) and weight is 188 lb (85.3 kg). Her temperature is 96.5 F (35.8 C) (abnormal). Her blood pressure is 107/69 and her pulse is 77. Her respiration is 18 and oxygen saturation is 98%.   Reason for encounter: medication management.    No change in medical history since last visit.  Patient's pain is at baseline.  Patient continues  multimodal pain regimen as prescribed.  States that it provides pain relief and improvement in functional status.  Pharmacotherapy Assessment   Analgesic: Tramadol 50 mg 4 times daily as needed, quantity 120/month; MME equals 20    Monitoring: [place] PMP: PDMP reviewed during this encounter.       Pharmacotherapy: No side-effects or adverse reactions reported. Compliance: No problems identified. Effectiveness: Clinically acceptable.  Dewayne Shorter, RN  11/11/2020 10:28 AM  Signed Nursing Pain Medication Assessment:  Safety precautions to be maintained throughout the outpatient stay will include: orient to surroundings, keep bed in low position, maintain call bell within reach at all times, provide assistance with transfer out of bed and ambulation.  Medication Inspection Compliance: Pill count conducted under aseptic conditions, in front of the patient. Neither the pills nor the bottle was removed from the patient's sight at any time. Once count was completed pills were immediately returned to the patient in their original bottle.  Medication: Tramadol (Ultram) Pill/Patch Count: 13 of 120 pills remain Pill/Patch Appearance: Markings consistent with prescribed medication Bottle Appearance: Standard pharmacy container. Clearly labeled. Filled Date: 03 / 08 / 2022 Last Medication intake:  Today    UDS:  Summary  Date Value Ref Range Status  05/13/2020 Note  Final    Comment:    ==================================================================== [employer] Select 13 (MW) ==================================================================== Test                             Result       Flag       Units  Drug Present   Tramadol                       >  4762                   ng/mg creat   O-Desmethyltramadol            >4762                   ng/mg creat   N-Desmethyltramadol            2534                    ng/mg creat    Source of tramadol is a prescription medication. O-desmethyltramadol     and N-desmethyltramadol are expected metabolites of tramadol.  ==================================================================== Test                      Result    Flag   Units      Ref Range   Creatinine              105              mg/dL      >=20 ==================================================================== Declared Medications:  Medication list was not provided. ==================================================================== For clinical consultation, please call [phone]. ====================================================================      ROS  Constitutional: Denies any fever or chills Gastrointestinal: No reported hemesis, hematochezia, vomiting, or acute GI distress Musculoskeletal: neck pain left Neurological: No reported episodes of acute onset apraxia, aphasia, dysarthria, agnosia, amnesia, paralysis, loss of coordination, or loss of consciousness  Medication Review  ARIPiprazole, Microlet Lancets, Oxygen-Helium, Vitamin D (Ergocalciferol), albuterol, amLODipine, blood glucose meter kit and supplies, fluconazole, fluticasone, gabapentin, gemfibrozil, glucose blood, hydrocortisone, metoprolol succinate, nystatin cream, omega-3 acid ethyl esters, omeprazole, rosuvastatin, telmisartan, tiZANidine, torsemide, traMADol, umeclidinium-vilanterol, and venlafaxine XR  History Review  Allergy: Ms. Rupard is allergic to dapsone, hydrochlorothiazide, sulfa antibiotics, and sulfasalazine. Drug: Ms. Peden  reports no history of drug use. Alcohol:  reports no history of alcohol use. Tobacco:  reports that she quit smoking about 14 years ago. Her smoking use included cigarettes. She started smoking about 57 years ago. She has a 32.00 pack-year smoking history. She has never used smokeless tobacco. Social: Ms. Belote  reports that she quit smoking about 14 years ago. Her smoking use included cigarettes. She started smoking about 57 years ago. She has a 32.00  pack-year smoking history. She has never used smokeless tobacco. She reports that she does not drink alcohol and does not use drugs. Medical:  has a past medical history of Anemia, Anxiety, Arthritis, Colon cancer ([place]) (8676), Complication of anesthesia, Constipation, COPD (chronic obstructive pulmonary disease) ([place]), Depression, Diabetes mellitus without complication ([place]), Dysrhythmia, Emphysema of lung ([place]), GERD (gastroesophageal reflux disease), H/O allergic rhinitis, Heart murmur, Hemorrhoid, Hyperlipidemia, Hypertension, Macular degeneration, Neck pain, chronic, Neuritis, Ovarian failure, Personal history of chemotherapy, Personal history of radiation therapy, PONV (postoperative nausea and vomiting), Pre-diabetes (2019), Proteinuria, Psoriasis, Sleep apnea (09/12/2016), Tachycardia, paroxysmal ([place]), and Urinary incontinence. Surgical: Ms. Rogalski  has a past surgical history that includes Tubal ligation; tonsillectomy (1959); herniated disc repair (2001); Anterior fusion lumbar spine (1990); Neck surgery; Mass excision (Left, 02/23/2016); Colonoscopy (2018); Portacath placement (Left, 03/12/2016); Cardiac catheterization (2009? ); Cardiac catheterization (05/2012); Port-a-cath removal (2018); Liver biopsy (N/A, 09/12/2017); Cholecystectomy (N/A, 09/12/2017); Trigger finger release (Right, 09/2018); and Spine surgery. Family: family history includes Asthma in her mother; Breast cancer in her maternal aunt; Heart attack in her mother; Liver cancer in her father.  Laboratory Chemistry Profile   Renal Lab Results  Component  Value Date   BUN 18 04/23/2019   CREATININE 0.93 04/23/2019   LABCREA 128 94/49/6759   BCR NOT APPLICABLE 16/38/4665   GFRAA 75 04/23/2019   GFRNONAA 65 04/23/2019     Hepatic Lab Results  Component Value Date   AST 37 12/12/2019   ALT 40 (H) 12/12/2019   ALBUMIN 5.2 (H) 12/12/2019   ALKPHOS 70 12/12/2019   LIPASE 27 08/03/2017     Electrolytes Lab Results  Component  Value Date   NA 145 04/23/2019   K 4.7 04/23/2019   CL 104 04/23/2019   CALCIUM 10.6 (H) 04/23/2019     Bone Lab Results  Component Value Date   VD25OH 14 (L) 04/23/2019     Inflammation (CRP: Acute Phase) (ESR: Chronic Phase) No results found for: CRP, ESRSEDRATE, LATICACIDVEN     Note: Above Lab results reviewed.  Recent Imaging Review  MR LUMBAR SPINE WO CONTRAST CLINICAL DATA:  lumbosacral Low back pain, > 6 wks Lumbar radiculopathy  EXAM: MRI LUMBAR SPINE WITHOUT CONTRAST  TECHNIQUE: Multiplanar, multisequence MR imaging of the lumbar spine was performed. No intravenous contrast was administered.  COMPARISON:  02/28/2015 MRI lumbar spine.  FINDINGS: Segmentation:  Standard.  Alignment:  Normal alignment.  Vertebrae: Mild bone marrow heterogeneity with multilevel Modic type 2 endplate degenerative changes and osteophytosis. L4 hemangioma. Vertebral body heights are preserved. No fracture or evidence of discitis. No pars defect.  Conus medullaris and cauda equina: Conus extends to the L2 level. Conus and cauda equina appear normal.  Disc levels: Multilevel desiccation and disc space loss most prominent at the L5-S1 level.  T12-L1: No significant disc bulge. Bilateral facet degenerative spurring. Patent spinal canal and neural foramen.  L1-2: Mild disc bulge with shallow superimposed left foraminal protrusion. Prominent ligamentum flavum and bilateral facet hypertrophy. Partial effacement of the ventral CSF containing spaces. Patent spinal canal and neural foramen.  L2-3: Disc bulge with shallow central protrusion, prominent ligamentum flavum and bilateral facet degenerative spurring. Patent left neural foramen. Mild right neural foraminal narrowing. Mild spinal canal narrowing with thecal sac measuring 10.4 mm in AP dimension.  L3-4: Disc bulge with shallow superimposed right foraminal protrusion. Ligamentum flavum thickening and bilateral  facet hypertrophy. Partial effacement of the lateral recesses. Patent spinal canal with the thecal sac measuring 13.3 mm in AP dimension. Mild right and moderate left neural foraminal narrowing.  L4-5: Disc bulge with shallow superimposed central and right foraminal protrusions. Prominent ligamentum flavum and bilateral facet hypertrophy. Mild spinal canal narrowing with the thecal sac measuring 11 mm in AP dimension. Mild left greater than right neural foraminal narrowing.  L5-S1: Minimal disc bulge with shallow central protrusion. Sequela of left laminotomy. There is no significant fibrosis/scarring. Patent spinal canal and bilateral neural foramen.  Paraspinal and other soft tissues: Duplicated IVC.  IMPRESSION: Multilevel spondylosis, grossly unchanged since prior exam.  Moderate left L3-4 neural foraminal narrowing. Mild L4-5 spinal canal narrowing.  Mild right L2-4 and bilateral L4-5 neural foraminal narrowing.  Sequela of prior left laminotomy at the L5-S1 level. No significant fibrosis or scarring.  Electronically Signed   By: Primitivo Gauze M.D.   On: 06/19/2020 08:33 Note: Reviewed        Physical Exam  General appearance: Well nourished, well developed, and well hydrated. In no apparent acute distress Mental status: Alert, oriented x 3 (person, place, & time)       Respiratory: No evidence of acute respiratory distress Eyes: PERLA Vitals: BP 107/69   Pulse 77  Temp (!) 96.5 F (35.8 C)   Resp 18   Ht 5' 4"  (1.626 m)   Wt 188 lb (85.3 kg)   LMP 03/04/2002 (Approximate)   SpO2 98%   BMI 32.27 kg/m  BMI: Estimated body mass index is 32.27 kg/m as calculated from the following:   Height as of this encounter: 5' 4"  (1.626 m).   Weight as of this encounter: 188 lb (85.3 kg). Ideal: Ideal body weight: 54.7 kg (120 lb 9.5 oz) Adjusted ideal body weight: 66.9 kg (147 lb 8.9 oz)  +LEFT cervical pain, pain with cervical extension  Assessment   Status  Diagnosis  Controlled Controlled Controlled 1. Chronic pain syndrome   2. Chronic cervical pain   3. Disorder of SI (sacroiliac) joint   4. Arthritis of sacroiliac joint of both sides   5. Cervical radiculopathy (left)   6. Lumbar radiculopathy   7. Chronic radicular lumbar pain      Updated Problems: Problem  Disorder of Si (Sacroiliac) Joint  Arthritis of Sacroiliac Joint of Both Sides  Chronic Radicular Lumbar Pain  Chronic Cervical Pain    Plan of Care  [name] has a current medication list which includes the following long-term medication(s): amlodipine, aripiprazole, fluticasone, metoprolol succinate, omega-3 acid ethyl esters, omeprazole, rosuvastatin, telmisartan, torsemide, venlafaxine xr, gabapentin, and [START ON 11/13/2020] tramadol.  Pharmacotherapy (Medications Ordered): Meds ordered this encounter  Medications  . traMADol (ULTRAM) 50 MG tablet    Sig: Take 1 tablet (50 mg total) by mouth every 6 (six) hours as needed for severe pain. Each fill must last 30 days    Dispense:  120 tablet    Refill:  5    [place] STOP ACT - Not applicable. Fill one day early if pharmacy is closed on scheduled refill date.  [place] tiZANidine (ZANAFLEX) 4 MG tablet    Sig: Take 1 tablet (4 mg total) by mouth every 8 (eight) hours as needed.    Dispense:  270 tablet    Refill:  1  . gabapentin (NEURONTIN) 300 MG capsule    Sig: TAKE 1 CAPSULE(300 MG) BY MOUTH THREE TIMES DAILY    Dispense:  270 capsule    Refill:  1    Follow-up plan:   Return in about 6 months (around 05/13/2021) for Medication Management, in person.   Recent Visits No visits were found meeting these conditions. Showing recent visits within past 90 days and meeting all other requirements Today's Visits Date Type Provider Dept  11/11/20 Office Visit Gillis Santa, MD [facility]  Showing today's visits and meeting all other requirements Future Appointments No visits were found meeting these  conditions. Showing future appointments within next 90 days and meeting all other requirements  I discussed the assessment and treatment plan with the patient. The patient was provided an opportunity to ask questions and all were answered. The patient agreed with the plan and demonstrated an understanding of the instructions.  Patient advised to call back or seek an in-person evaluation if the symptoms or condition worsens.  Duration of encounter: 30 minutes.  Note by: Gillis Santa, MD Date: 11/11/2020; Time: 12:05 PM

## 2020-11-11 NOTE — Progress Notes (Signed)
Nursing Pain Medication Assessment:  Safety precautions to be maintained throughout the outpatient stay will include: orient to surroundings, keep bed in low position, maintain call bell within reach at all times, provide assistance with transfer out of bed and ambulation.  Medication Inspection Compliance: Pill count conducted under aseptic conditions, in front of the patient. Neither the pills nor the bottle was removed from the patient's sight at any time. Once count was completed pills were immediately returned to the patient in their original bottle.  Medication: Tramadol (Ultram) Pill/Patch Count: 13 of 120 pills remain Pill/Patch Appearance: Markings consistent with prescribed medication Bottle Appearance: Standard pharmacy container. Clearly labeled. Filled Date: 03 / 08 / 2022 Last Medication intake:  Today

## 2020-11-12 MED ORDER — HYDROCORTISONE 2.5 % EX CREA: TOPICAL_CREAM | Freq: Two times a day (BID) | CUTANEOUS | 0 refills | Status: DC

## 2020-11-26 DIAGNOSIS — L400 Psoriasis vulgaris: Secondary | ICD-10-CM | POA: Diagnosis not present

## 2020-12-07 DIAGNOSIS — C211 Malignant neoplasm of anal canal: Secondary | ICD-10-CM | POA: Diagnosis not present

## 2020-12-07 DIAGNOSIS — I270 Primary pulmonary hypertension: Secondary | ICD-10-CM | POA: Diagnosis not present

## 2020-12-07 DIAGNOSIS — R0902 Hypoxemia: Secondary | ICD-10-CM | POA: Diagnosis not present

## 2020-12-15 ENCOUNTER — Other Ambulatory Visit: Payer: Self-pay | Admitting: Family Medicine

## 2020-12-15 DIAGNOSIS — K219 Gastro-esophageal reflux disease without esophagitis: Secondary | ICD-10-CM

## 2020-12-17 DIAGNOSIS — Z9981 Dependence on supplemental oxygen: Secondary | ICD-10-CM | POA: Diagnosis not present

## 2020-12-17 DIAGNOSIS — J438 Other emphysema: Secondary | ICD-10-CM | POA: Diagnosis not present

## 2020-12-17 DIAGNOSIS — J9611 Chronic respiratory failure with hypoxia: Secondary | ICD-10-CM | POA: Diagnosis not present

## 2020-12-24 DIAGNOSIS — L400 Psoriasis vulgaris: Secondary | ICD-10-CM | POA: Diagnosis not present

## 2021-01-07 DIAGNOSIS — I270 Primary pulmonary hypertension: Secondary | ICD-10-CM | POA: Diagnosis not present

## 2021-01-07 DIAGNOSIS — C211 Malignant neoplasm of anal canal: Secondary | ICD-10-CM | POA: Diagnosis not present

## 2021-01-07 DIAGNOSIS — R918 Other nonspecific abnormal finding of lung field: Secondary | ICD-10-CM | POA: Diagnosis not present

## 2021-01-07 DIAGNOSIS — R0902 Hypoxemia: Secondary | ICD-10-CM | POA: Diagnosis not present

## 2021-01-07 DIAGNOSIS — J438 Other emphysema: Secondary | ICD-10-CM | POA: Diagnosis not present

## 2021-01-07 DIAGNOSIS — J9611 Chronic respiratory failure with hypoxia: Secondary | ICD-10-CM | POA: Diagnosis not present

## 2021-01-07 DIAGNOSIS — Z9981 Dependence on supplemental oxygen: Secondary | ICD-10-CM | POA: Diagnosis not present

## 2021-01-15 IMAGING — CR DG HIP (WITH OR WITHOUT PELVIS) 2-3V*R*
1 series · 3 of 3 positions shown · non-contrast
Comparison: Left hip performed today

CLINICAL DATA: Bilateral hip pain

EXAM:
DG HIP (WITH OR WITHOUT PELVIS) 2-3V RIGHT

[Series 1: dg hip unilat w or w/o pelvis 2-3 views  · non-contrast · 0.14mm/px · 3 of 3 slices shown]
[im 1/3]
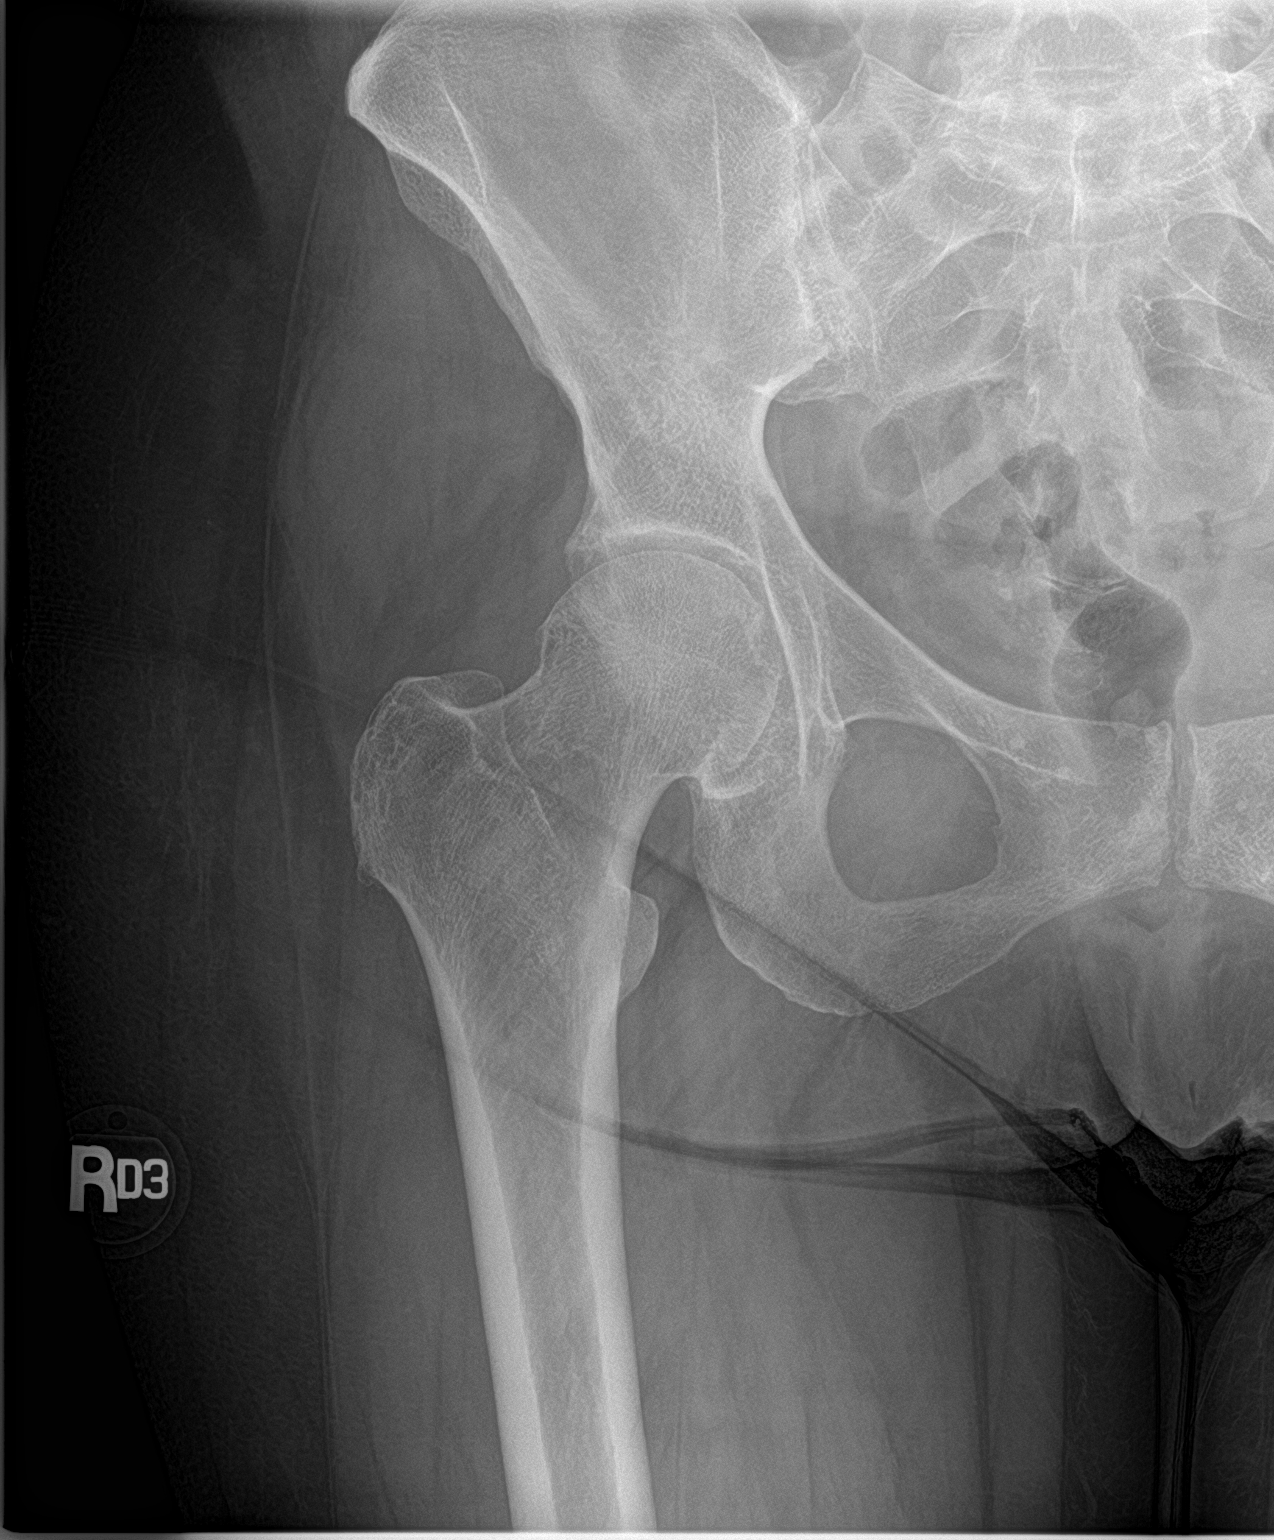
[im 2/3]
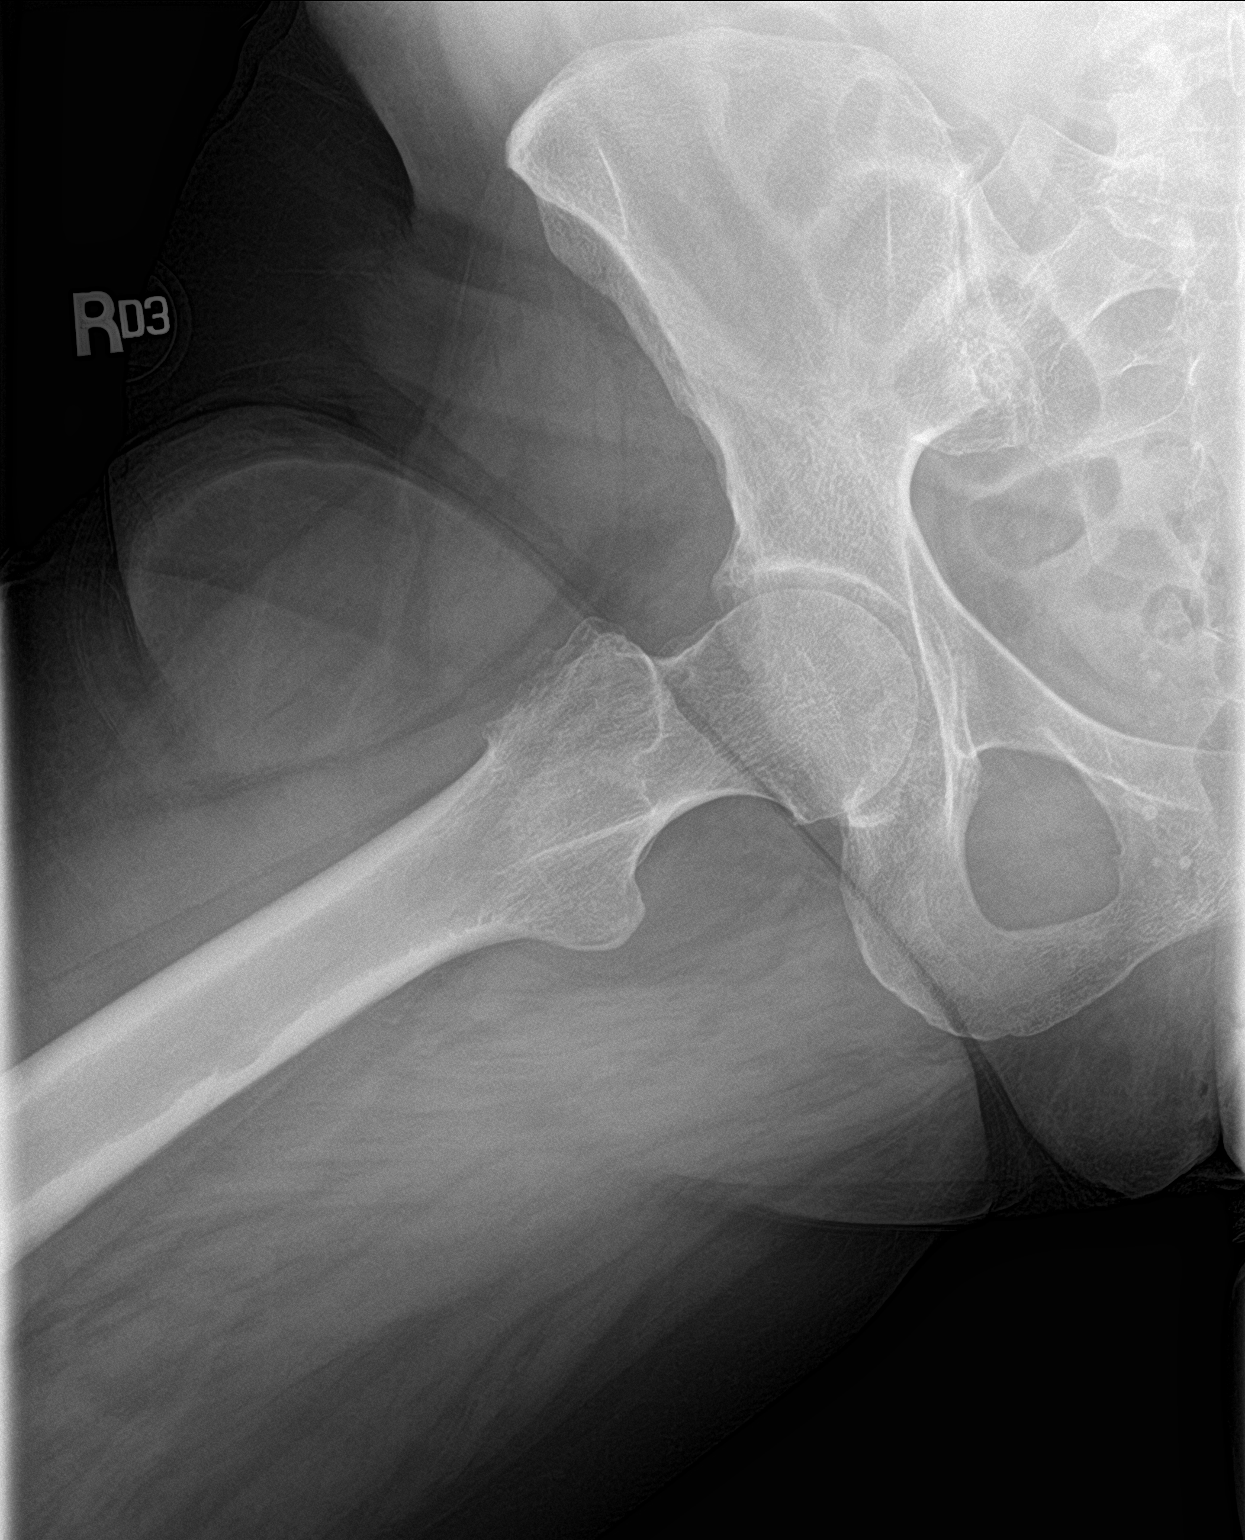
[im 3/3]
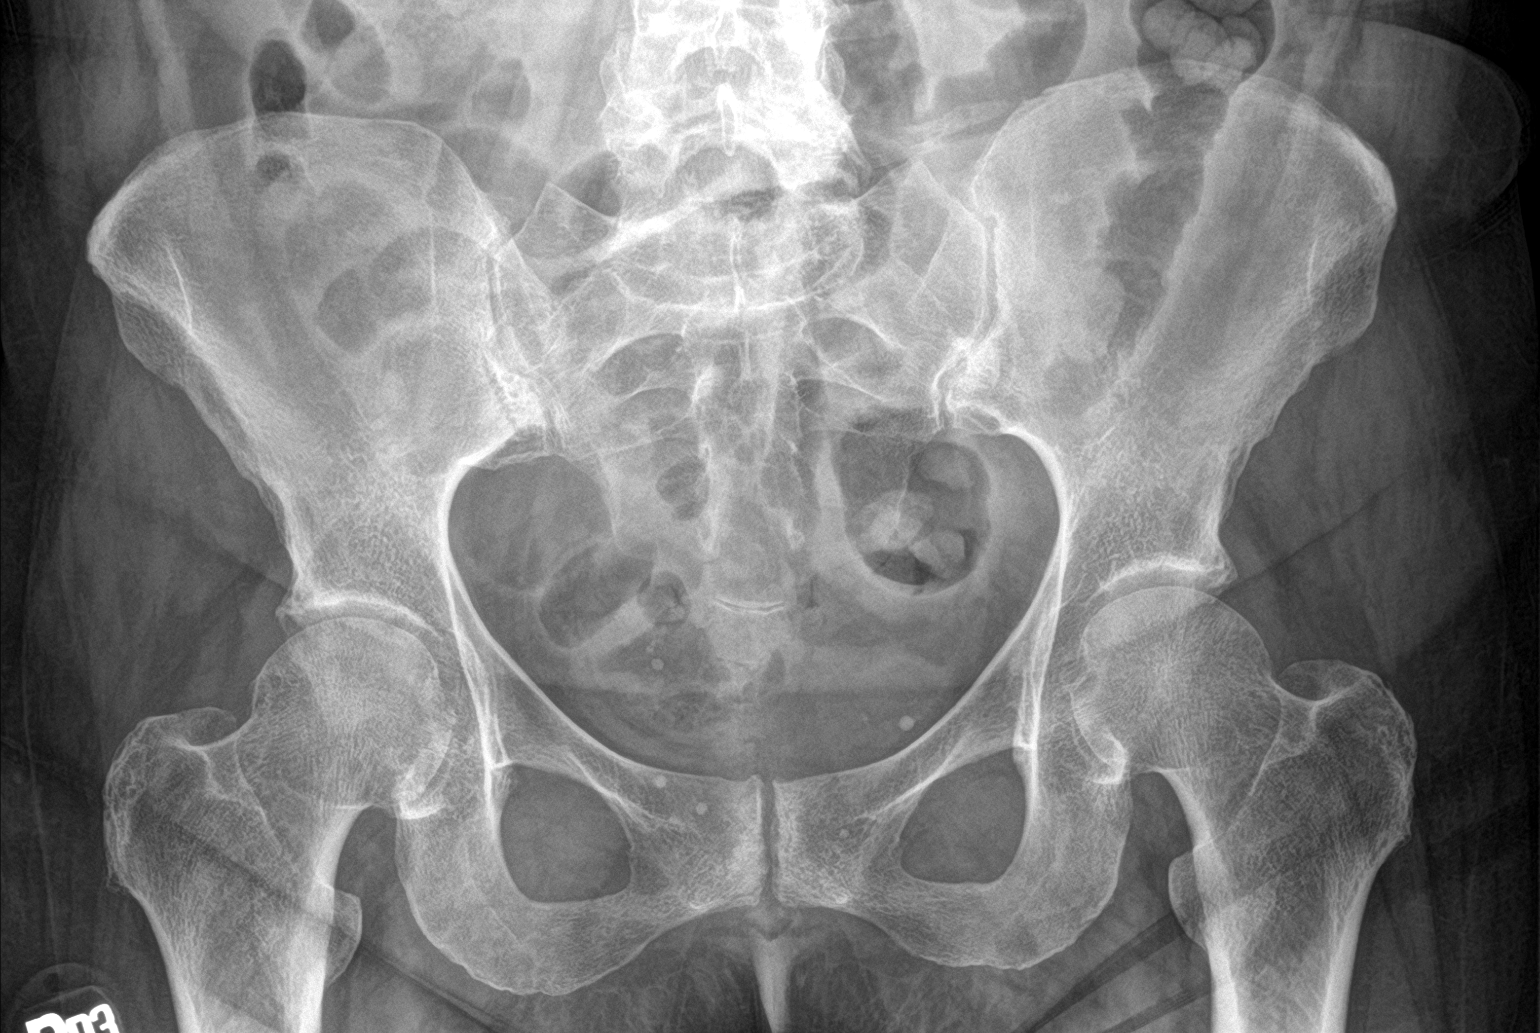

[3 of 3 positions shown; findings below may reference images not displayed]

FINDINGS: Hip joints and SI joints are symmetric and unremarkable. No acute
bony abnormality. Specifically, no fracture, subluxation, or
dislocation.
IMPRESSION: No acute bony abnormality.

## 2021-01-16 DIAGNOSIS — I10 Essential (primary) hypertension: Secondary | ICD-10-CM | POA: Diagnosis not present

## 2021-01-16 DIAGNOSIS — Z9981 Dependence on supplemental oxygen: Secondary | ICD-10-CM | POA: Diagnosis not present

## 2021-01-16 DIAGNOSIS — J9611 Chronic respiratory failure with hypoxia: Secondary | ICD-10-CM | POA: Diagnosis not present

## 2021-01-16 DIAGNOSIS — E213 Hyperparathyroidism, unspecified: Secondary | ICD-10-CM | POA: Diagnosis not present

## 2021-01-20 ENCOUNTER — Encounter: Payer: Self-pay | Admitting: Family Medicine

## 2021-01-20 ENCOUNTER — Ambulatory Visit (INDEPENDENT_AMBULATORY_CARE_PROVIDER_SITE_OTHER): Payer: Medicare Other | Admitting: Family Medicine

## 2021-01-20 ENCOUNTER — Other Ambulatory Visit: Payer: Self-pay

## 2021-01-20 VITALS — BP 128/76 | HR 90 | Temp 98.0°F | Resp 16 | Ht 64.0 in | Wt 194.0 lb

## 2021-01-20 DIAGNOSIS — E781 Pure hyperglyceridemia: Secondary | ICD-10-CM

## 2021-01-20 DIAGNOSIS — E559 Vitamin D deficiency, unspecified: Secondary | ICD-10-CM

## 2021-01-20 DIAGNOSIS — J432 Centrilobular emphysema: Secondary | ICD-10-CM

## 2021-01-20 DIAGNOSIS — E213 Hyperparathyroidism, unspecified: Secondary | ICD-10-CM

## 2021-01-20 DIAGNOSIS — Z9981 Dependence on supplemental oxygen: Secondary | ICD-10-CM

## 2021-01-20 DIAGNOSIS — D692 Other nonthrombocytopenic purpura: Secondary | ICD-10-CM

## 2021-01-20 DIAGNOSIS — E1169 Type 2 diabetes mellitus with other specified complication: Secondary | ICD-10-CM

## 2021-01-20 DIAGNOSIS — Z23 Encounter for immunization: Secondary | ICD-10-CM

## 2021-01-20 DIAGNOSIS — F339 Major depressive disorder, recurrent, unspecified: Secondary | ICD-10-CM

## 2021-01-20 DIAGNOSIS — M542 Cervicalgia: Secondary | ICD-10-CM

## 2021-01-20 DIAGNOSIS — I479 Paroxysmal tachycardia, unspecified: Secondary | ICD-10-CM | POA: Diagnosis not present

## 2021-01-20 DIAGNOSIS — Z85048 Personal history of other malignant neoplasm of rectum, rectosigmoid junction, and anus: Secondary | ICD-10-CM

## 2021-01-20 DIAGNOSIS — F411 Generalized anxiety disorder: Secondary | ICD-10-CM

## 2021-01-20 DIAGNOSIS — J9611 Chronic respiratory failure with hypoxia: Secondary | ICD-10-CM | POA: Diagnosis not present

## 2021-01-20 DIAGNOSIS — G8929 Other chronic pain: Secondary | ICD-10-CM

## 2021-01-20 DIAGNOSIS — E785 Hyperlipidemia, unspecified: Secondary | ICD-10-CM | POA: Diagnosis not present

## 2021-01-20 LAB — POCT GLYCOSYLATED HEMOGLOBIN (HGB A1C): Hemoglobin A1C: 5.5 % (ref 4.0–5.6)

## 2021-01-20 MED ORDER — ZOSTER VAC RECOMB ADJUVANTED 50 MCG/0.5ML IM SUSR: 0.5000 mL | Freq: Once | INTRAMUSCULAR | 1 refills | Status: AC

## 2021-01-20 NOTE — Progress Notes (Signed)
Name: Judy Allen   MRN: [id number]    DOB: 02-20-1956   Date:01/20/2021       Progress Note  Subjective  Chief Complaint  Follow up   HPI  Hypertriglyceridemia and hyperparathyrodism: seeing Dr. Gabriel Carina, reviewed last labs done in her office 04/2020 . Currently taking Crestor , Lovaza and Gemfibrozil , denies side effects. Last Pth was 75 04/2020 at [facility]   HTN/Paroxysmal tachycardia: . She is currently taking Norvasc 2.5 , Telmisartan 80  Metoprolol 75  mg , she is under the care of Dr. Smith Mince - nephrologist, she states bp occasionally spikes to 170's, but usually under control in the 130's/80's She states palpitation is seldom now. No chest pain    MDD: she is now back on 225 mg of Effexor and Abilify.  Off Depakote and Wellbutrin because of high bp. She states since last visit only one panic attack while asleep. She still has lack of motivation, still living with [place]. She continues to do puzzles on her iPad, she enjoy some TV shows.     Hyperparathyroidism: calcium had gone  back to normal since off hctz. Monitored by nephrologist and endocrinologist. She is compliant    Emphysema and hypoxemia: on oxygen down to 2  liters - 24 hours per day sob is stable, no cough, but has intermittent wheezing. - she states not as bad lately  Under the care of pulmonologist at [facility], she is not wearing her CPAP machine in a while, she is unable to tolerate it    History of anal cancer: still going to [facility] radiation oncologist once a year.  She denies change in bowel movements of blood in stools   DMII: diagnosed 02/2018 by Dr. Gabriel Carina, hgbA1C was at 6.6%, A1C  but has been off Metformin and A1C has been within normal limits.  She has diabetes neuropathy - pain on both feet . She also has dyslipidemia. She does not follow a diabetic diet , she states she eats when she is sad.    Chronic pain: under the care of Dr. Holley Raring, she states she is doing well on Gabapentin , Tramadol, also taking muscle relaxer .  Pain is worse on her neck, but also has back pain, history of laminectomy. Back pain right now is 0/10, she is doing well on current regiment   Senile purpura: she states not as bad now, still bruises on both arms.  Unchanged   Psoriasis: she is doing well, no current rashes   Patient Active Problem List   Diagnosis Date Noted   Disorder of SI (sacroiliac) joint 11/11/2020   Arthritis of sacroiliac joint of both sides 11/11/2020   Chronic radicular lumbar pain 11/11/2020   Hypoxia 08/25/2019   Hyperparathyroidism ([place]) 08/25/2019   Diabetes mellitus type 2 in obese ([place]) 11/06/2018   Cervical radiculopathy (left) 08/31/2018   Osteoarthritis of spine with radiculopathy, cervical region 08/31/2018   History of anal cancer 05/30/2018   Myofascial pain syndrome, cervical 05/16/2018   Chronic pain syndrome 05/16/2018   Cervical facet joint syndrome 05/16/2018   Perihepatitis ([place]) 09/27/2017   Abnormal liver function test 09/27/2017   Fatty liver 12/22/2016   Chronic respiratory failure with hypoxia, on home oxygen therapy ([place]) 07/28/2016   Centrilobular emphysema ([place]) 07/14/2016   Abnormal Pap smear of cervix 04/14/2016   Non-thrombocytopenic purpura ([place]) 01/12/2016   Chronic constipation 10/16/2015   History of fusion of cervical spine 04/02/2015   Benign hypertension 01/16/2015   Chronic cervical pain 01/16/2015  CN (constipation) 01/16/2015   Gastric reflux 01/16/2015   Hypertriglyceridemia 01/16/2015   Edema leg 01/16/2015   Chronic recurrent major depressive disorder ([place]) 77/41/2878   Dysmetabolic syndrome 67/67/2094   Obstructive apnea 01/16/2015   Psoriasis 01/16/2015   Allergic rhinitis 01/16/2015   Bursitis, trochanteric 01/16/2015   GAD (generalized anxiety disorder) 01/16/2015   Tachycardia, paroxysmal ([place])     Past Surgical History:  Procedure Laterality Date   ANTERIOR FUSION LUMBAR SPINE  1990   plate in back, not metal   CARDIAC CATHETERIZATION  2009?     [facility]   CARDIAC CATHETERIZATION  05/2012   [facility]: Mild hypertension 37/12 with a mean pressure of 21 mm mercury   CHOLECYSTECTOMY N/A 09/12/2017   Procedure: LAPAROSCOPIC CHOLECYSTECTOMY WITH INTRAOPERATIVE CHOLANGIOGRAM;  Surgeon: Robert Bellow, MD;  Location: [facility] ORS;  Service: General;  Laterality: N/A;   COLONOSCOPY  2018   herniated disc repair  2001   LIVER BIOPSY N/A 09/12/2017   Procedure: LIVER BIOPSY;  Surgeon: Robert Bellow, MD;  Location: [facility] ORS;  Service: General;  Laterality: N/A;   MASS EXCISION Left 02/23/2016   Procedure: EXCISION MASS;  Surgeon: Christene Lye, MD;  Location: [facility] ORS;  Service: General;  Laterality: Left;   NECK SURGERY     PORT-A-CATH REMOVAL  2018   PORTACATH PLACEMENT Left 03/12/2016   Procedure: INSERTION PORT-A-CATH;  Surgeon: Christene Lye, MD;  Location: [facility] ORS;  Service: General;  Laterality: Left;   SPINE SURGERY     tonsillectomy  1959   history   TRIGGER FINGER RELEASE Right 09/2018   Dr. Sabra Heck    TUBAL LIGATION     s/p    Family History  Problem Relation Age of Onset   Heart attack Mother    Asthma Mother    Liver cancer Father    Breast cancer Maternal Aunt    Bladder Cancer Neg Hx    Kidney cancer Neg Hx    Colon cancer Neg Hx     Social History   Tobacco Use   Smoking status: Former    Packs/day: 1.00    Years: 32.00    Pack years: 32.00    Types: Cigarettes    Start date: 08/10/1963    Quit date: 01/15/2006    Years since quitting: 15.0   Smokeless tobacco: Never  Substance Use Topics   Alcohol use: No    Alcohol/week: 0.0 standard drinks     Current Outpatient Medications:    albuterol (PROVENTIL HFA;VENTOLIN HFA) 108 (90 Base) MCG/ACT inhaler, Inhale 2 puffs into the lungs every 6 (six) hours as needed for wheezing or shortness of breath., Disp: , Rfl:    amLODipine (NORVASC) 2.5 MG tablet, Take 1 tablet (2.5 mg total) by mouth daily., Disp: 90 tablet, Rfl: 1   ANORO ELLIPTA 62.5-25  MCG/INH AEPB, INL 1 PUFF PO D, Disp: , Rfl:    ARIPiprazole (ABILIFY) 10 MG tablet, Take 1 tablet (10 mg total) by mouth daily., Disp: 90 tablet, Rfl: 1   blood glucose meter kit and supplies, Dispense based on patient and insurance preference. Use up to four times daily as directed. (FOR ICD-10 E10.9, E11.9)., Disp: 1 each, Rfl: 0   CONTOUR NEXT TEST test strip, USE UP TO FOUR TIMES DAILY AS DIRECTED, Disp: 100 each, Rfl: 5   fluconazole (DIFLUCAN) 100 MG tablet, Take 1 tablet (100 mg total) by mouth daily. Take 1 tablet twice daily on Day #1, then 1 tablet daily for  next 6 days., Disp: 8 tablet, Rfl: 0   gabapentin (NEURONTIN) 300 MG capsule, TAKE 1 CAPSULE(300 MG) BY MOUTH THREE TIMES DAILY, Disp: 270 capsule, Rfl: 1   gemfibrozil (LOPID) 600 MG tablet, Take 600 mg by mouth 2 (two) times daily., Disp: , Rfl:    hydrocortisone 2.5 % cream, Apply topically 2 (two) times daily., Disp: 30 g, Rfl: 0   Metoprolol Succinate 50 MG CS24, Take 75 mg by mouth daily., Disp: , Rfl:    Microlet Lancets MISC, USE FOUR TIMES DAILY, Disp: 100 each, Rfl: 5   omega-3 acid ethyl esters (LOVAZA) 1 g capsule, Take 2 capsules (2 g total) by mouth 2 (two) times daily., Disp: 360 capsule, Rfl: 1   omeprazole (PRILOSEC) 40 MG capsule, TAKE 1 CAPSULE BY MOUTH  DAILY, Disp: 90 capsule, Rfl: 2   OXYGEN, Inhale 2 L into the lungs continuous. , Disp: , Rfl:    rosuvastatin (CRESTOR) 5 MG tablet, Take 1 tablet (5 mg total) by mouth daily., Disp: 90 tablet, Rfl: 1   telmisartan (MICARDIS) 80 MG tablet, Take 80 mg by mouth daily., Disp: , Rfl:    tiZANidine (ZANAFLEX) 4 MG tablet, Take 1 tablet (4 mg total) by mouth every 8 (eight) hours as needed., Disp: 270 tablet, Rfl: 1   torsemide (DEMADEX) 5 MG tablet, Take 5 mg by mouth daily., Disp: , Rfl:    traMADol (ULTRAM) 50 MG tablet, Take 1 tablet (50 mg total) by mouth every 6 (six) hours as needed for severe pain. Each fill must last 30 days, Disp: 120 tablet, Rfl: 5    venlafaxine XR (EFFEXOR-XR) 75 MG 24 hr capsule, Take 3 capsules (225 mg total) by mouth daily with breakfast., Disp: 270 capsule, Rfl: 1   Vitamin D, Ergocalciferol, (DRISDOL) 1.25 MG (50000 UT) CAPS capsule, Take 1 capsule (50,000 Units total) by mouth every 7 (seven) days., Disp: 12 capsule, Rfl: 1   Zoster Vaccine Adjuvanted [facility]) injection, Inject 0.5 mLs into the muscle once for 1 dose., Disp: 0.5 mL, Rfl: 1  Allergies  Allergen Reactions   Dapsone Other (See Comments)    Hypoxemia   Hydrochlorothiazide     History of hyperparathyroidism    Sulfa Antibiotics Rash   Sulfasalazine Rash    I personally reviewed active problem list, medication list, allergies, family history, social history, health maintenance with the patient/caregiver today.   ROS  Constitutional: Negative for fever , positive for weight change.  Respiratory: Negative for cough , positive for shortness of breath.   Cardiovascular: Negative for chest pain but has occasional  palpitations.  Gastrointestinal: Negative for abdominal pain, no bowel changes.  Musculoskeletal: Negative for gait problem or joint swelling.  Skin: Negative for rash.  Neurological: Negative for dizziness or headache.  No other specific complaints in a complete review of systems (except as listed in HPI above).   Objective  Vitals:   01/20/21 0959  BP: 128/76  Pulse: 90  Resp: 16  Temp: 98 F (36.7 C)  SpO2: 99%  Weight: 194 lb (88 kg)  Height: _0  (1.626 m)    Body mass index is 33.3 kg/m.  Physical Exam  Constitutional: Patient appears well-developed and well-nourished.  No distress.  HEENT: head atraumatic, normocephalic, pupils equal and reactive to light,  neck supple,  Cardiovascular: Normal rate, regular rhythm and normal heart sounds.  No murmur heard. Trace BLE edema. Pulmonary/Chest: Effort normal and breath sounds normal. No respiratory distress. Abdominal: Soft.  There is no  tenderness. Psychiatric:  Patient has a normal mood and affect. behavior is normal. Judgment and thought content normal.   Recent Results (from the past 2160 hour(s))  POCT HgB A1C     Status: None   Collection Time: 01/20/21 10:26 AM  Result Value Ref Range   Hemoglobin A1C 5.5 4.0 - 5.6 %   HbA1c POC (<> result, manual entry)     HbA1c, POC (prediabetic range)     HbA1c, POC (controlled diabetic range)       PHQ2/9: Depression screen [facility] 2/9 01/20/2021 11/11/2020 10/17/2020 07/31/2020 06/19/2020  Decreased Interest 3 0 _0 Down, Depressed, Hopeless 3 0 _1 PHQ - 2 Score 6 0 _2 Altered sleeping 0 - 0 0 0  Tired, decreased energy 0 - _3 Change in appetite 1 - 0 0 0  Feeling bad or failure about yourself  1 - _4 Trouble concentrating 0 - 0 0 0  Moving slowly or fidgety/restless 0 - 0 1 0  Suicidal thoughts 0 - 0 0 0  PHQ-9 Score 8 - _5 Difficult doing work/chores - - - Not difficult at all -  Some recent data might be hidden    phq 9 is positive   Fall Risk: Fall Risk  01/20/2021 11/11/2020 10/17/2020 07/31/2020 06/19/2020  Falls in the past year? 0 0 0 0 0  Number falls in past yr: 0 - 0 0 0  Injury with Fall? 0 - 0 0 0  Risk for fall due to : - - - No Fall Risks -  Follow up - - - Falls prevention discussed -     Functional Status Survey: Is the patient deaf or have difficulty hearing?: No Does the patient have difficulty seeing, even when wearing glasses/contacts?: No Does the patient have difficulty concentrating, remembering, or making decisions?: No Does the patient have difficulty walking or climbing stairs?: No Does the patient have difficulty dressing or bathing?: No Does the patient have difficulty doing errands alone such as visiting a doctor's office or shopping?: No    Assessment & Plan  1. Dyslipidemia associated with type 2 diabetes mellitus (HCC)  - POCT HgB A1C  2. Need for shingles vaccine  - Zoster Vaccine Adjuvanted [facility]) injection; Inject 0.5  mLs into the muscle once for 1 dose.  Dispense: 0.5 mL; Refill: 1  3. Chronic respiratory failure with hypoxia, on home oxygen therapy ([place])   4. History of anal cancer   5. Tachycardia, paroxysmal (HCC)   6. Chronic recurrent major depressive disorder ([place])  She refuses referral to psychiatrist, she states she will think about seeing a therapist   7. GAD (generalized anxiety disorder)   8. Hypertriglyceridemia   9. Hyperparathyroidism ([place])  Keep follow up with Endo and nephrologist   10. Chronic neck pain   11. Senile purpura (HCC)  Stable   12. Centrilobular emphysema ([place])  Keep follow up with pulmonologist  95. Vitamin D deficiency

## 2021-02-06 DIAGNOSIS — C211 Malignant neoplasm of anal canal: Secondary | ICD-10-CM | POA: Diagnosis not present

## 2021-02-06 DIAGNOSIS — I270 Primary pulmonary hypertension: Secondary | ICD-10-CM | POA: Diagnosis not present

## 2021-02-06 DIAGNOSIS — R0902 Hypoxemia: Secondary | ICD-10-CM | POA: Diagnosis not present

## 2021-02-27 DIAGNOSIS — I10 Essential (primary) hypertension: Secondary | ICD-10-CM | POA: Diagnosis not present

## 2021-03-09 DIAGNOSIS — I270 Primary pulmonary hypertension: Secondary | ICD-10-CM | POA: Diagnosis not present

## 2021-03-09 DIAGNOSIS — C211 Malignant neoplasm of anal canal: Secondary | ICD-10-CM | POA: Diagnosis not present

## 2021-03-09 DIAGNOSIS — R0902 Hypoxemia: Secondary | ICD-10-CM | POA: Diagnosis not present

## 2021-03-10 ENCOUNTER — Other Ambulatory Visit: Payer: Self-pay | Admitting: Student in an Organized Health Care Education/Training Program

## 2021-03-10 ENCOUNTER — Other Ambulatory Visit: Payer: Self-pay | Admitting: Family Medicine

## 2021-03-10 DIAGNOSIS — E1169 Type 2 diabetes mellitus with other specified complication: Secondary | ICD-10-CM

## 2021-03-10 DIAGNOSIS — F411 Generalized anxiety disorder: Secondary | ICD-10-CM

## 2021-03-10 DIAGNOSIS — F339 Major depressive disorder, recurrent, unspecified: Secondary | ICD-10-CM

## 2021-03-10 DIAGNOSIS — G894 Chronic pain syndrome: Secondary | ICD-10-CM

## 2021-03-10 DIAGNOSIS — G8929 Other chronic pain: Secondary | ICD-10-CM

## 2021-03-10 DIAGNOSIS — I10 Essential (primary) hypertension: Secondary | ICD-10-CM

## 2021-03-10 NOTE — Telephone Encounter (Signed)
Requested Prescriptions  Pending Prescriptions Disp Refills  . venlafaxine XR (EFFEXOR-XR) 75 MG 24 hr capsule [Pharmacy Med Name: Venlafaxine HCl ER 75 MG Oral Capsule Extended Release 24 Hour] 270 capsule 1    Sig: TAKE 3 CAPSULES BY MOUTH  DAILY WITH BREAKFAST     Psychiatry: Antidepressants - SNRI - desvenlafaxine & venlafaxine Failed - 03/10/2021 10:44 PM      Failed - LDL in normal range and within 360 days    LDL Cholesterol (Calc)  Date Value Ref Range Status  04/23/2019 78 mg/dL (calc) Final    Comment:    Reference range: <100 . Desirable range <100 mg/dL for primary prevention;   <70 mg/dL for patients with CHD or diabetic patients  with > or = 2 CHD risk factors. Marland Kitchen LDL-C is now calculated using the Martin-Hopkins  calculation, which is a validated novel method providing  better accuracy than the Friedewald equation in the  estimation of LDL-C.  [name]. 2500;370(48): 2061-2068  (http://education.[facility].com/faq/FAQ164)          Failed - Triglycerides in normal range and within 360 days    Triglycerides  Date Value Ref Range Status  05/05/2020 246 (A) 40 - 160 Final         Passed - Total Cholesterol in normal range and within 360 days    Cholesterol, Total  Date Value Ref Range Status  09/30/2015 194 100 - 199 mg/dL Final   Cholesterol  Date Value Ref Range Status  05/05/2020 85 0 - 200 Final         Passed - Completed PHQ-2 or PHQ-9 in the last 360 days      Passed - Last BP in normal range    BP Readings from Last 1 Encounters:  01/20/21 128/76         Passed - Valid encounter within last 6 months    Recent Outpatient Visits          1 month ago Dyslipidemia associated with type 2 diabetes mellitus [facility])   [facility], Judy Allen, Judy Allen   4 months ago Dyslipidemia associated with type 2 diabetes mellitus [facility])   [facility] [place], Judy Allen, Judy Allen   8 months ago Dyslipidemia associated  with type 2 diabetes mellitus [facility])   [facility] Judy Allen, Judy Allen   11 months ago Uncontrolled hypertension   [facility] Judy Allen, Judy Allen   1 year ago Uncontrolled hypertension   [facility] Judy Allen, Judy Allen      Future Appointments            In 4 months Judy Allen, Judy Allen, Judy Allen [facility] [place], [place]   In 4 months  [facility], [place]           . rosuvastatin (CRESTOR) 5 MG tablet [Pharmacy Med Name: Rosuvastatin Calcium 5 MG Oral Tablet] 90 tablet 1    Sig: TAKE 1 TABLET BY MOUTH  DAILY     Cardiovascular:  Antilipid - Statins Failed - 03/10/2021 10:44 PM      Failed - LDL in normal range and within 360 days    LDL Cholesterol (Calc)  Date Value Ref Range Status  04/23/2019 78 mg/dL (calc) Final    Comment:    Reference range: <100 . Desirable range <100 mg/dL for primary prevention;   <70 mg/dL for patients with CHD or diabetic patients  with > or = 2 CHD risk  factors. Marland Kitchen LDL-C is now calculated using the Martin-Hopkins  calculation, which is a validated novel method providing  better accuracy than the Friedewald equation in the  estimation of LDL-C.  [name]. 6203;559(74): 2061-2068  (http://education.[facility].com/faq/FAQ164)          Failed - HDL in normal range and within 360 days    HDL  Date Value Ref Range Status  05/05/2020 27 (A) 35 - 70 Final  09/30/2015 45 >39 mg/dL Final         Failed - Triglycerides in normal range and within 360 days    Triglycerides  Date Value Ref Range Status  05/05/2020 246 (A) 40 - 160 Final         Passed - Total Cholesterol in normal range and within 360 days    Cholesterol, Total  Date Value Ref Range Status  09/30/2015 194 100 - 199 mg/dL Final   Cholesterol  Date Value Ref Range Status  05/05/2020 85 0 - 200 Final         Passed - Patient is not pregnant      Passed - Valid encounter within  last 12 months    Recent Outpatient Visits          1 month ago Dyslipidemia associated with type 2 diabetes mellitus [facility])   [facility] [place], Judy Allen, Judy Allen   4 months ago Dyslipidemia associated with type 2 diabetes mellitus [facility])   [facility] [place], Judy Allen, Judy Allen   8 months ago Dyslipidemia associated with type 2 diabetes mellitus [facility])   [facility] Judy Allen, Judy Allen   11 months ago Uncontrolled hypertension   [facility] Judy Allen, Judy Allen   1 year ago Uncontrolled hypertension   [facility] [place], Judy Allen, Judy Allen      Future Appointments            In 4 months Judy Allen, Judy Allen, Judy Allen [facility], [place]   In 4 months  [facility], [facility]           . amLODipine (NORVASC) 2.5 MG tablet [Pharmacy Med Name: amLODIPine Besylate 2.5 MG Oral Tablet] 90 tablet 1    Sig: TAKE 1 TABLET BY MOUTH  DAILY     Cardiovascular:  Calcium Channel Blockers Passed - 03/10/2021 10:44 PM      Passed - Last BP in normal range    BP Readings from Last 1 Encounters:  01/20/21 128/76         Passed - Valid encounter within last 6 months    Recent Outpatient Visits          1 month ago Dyslipidemia associated with type 2 diabetes mellitus [facility])   [facility] Judy Allen, Judy Allen   4 months ago Dyslipidemia associated with type 2 diabetes mellitus [facility])   [facility] [place], Judy Allen, Judy Allen   8 months ago Dyslipidemia associated with type 2 diabetes mellitus [facility])   [facility] Judy Allen, Judy Allen   11 months ago Uncontrolled hypertension   [facility] Judy Allen, Judy Allen   1 year ago Uncontrolled hypertension   [facility] Judy Allen, Judy Allen      Future Appointments            In 4 months Judy Allen, Judy Allen, Judy Allen [facility], [place]   In 4 months  [facility], [facility]

## 2021-03-19 LAB — HM DIABETES EYE EXAM

## 2021-03-25 DIAGNOSIS — L570 Actinic keratosis: Secondary | ICD-10-CM | POA: Diagnosis not present

## 2021-03-25 DIAGNOSIS — L400 Psoriasis vulgaris: Secondary | ICD-10-CM | POA: Diagnosis not present

## 2021-04-08 DIAGNOSIS — G4736 Sleep related hypoventilation in conditions classified elsewhere: Secondary | ICD-10-CM | POA: Diagnosis not present

## 2021-04-08 DIAGNOSIS — I10 Essential (primary) hypertension: Secondary | ICD-10-CM | POA: Diagnosis not present

## 2021-04-08 DIAGNOSIS — J9611 Chronic respiratory failure with hypoxia: Secondary | ICD-10-CM | POA: Diagnosis not present

## 2021-04-08 DIAGNOSIS — I272 Pulmonary hypertension, unspecified: Secondary | ICD-10-CM | POA: Diagnosis not present

## 2021-04-08 DIAGNOSIS — G4733 Obstructive sleep apnea (adult) (pediatric): Secondary | ICD-10-CM | POA: Diagnosis not present

## 2021-04-08 DIAGNOSIS — Z9981 Dependence on supplemental oxygen: Secondary | ICD-10-CM | POA: Diagnosis not present

## 2021-04-08 DIAGNOSIS — J438 Other emphysema: Secondary | ICD-10-CM | POA: Diagnosis not present

## 2021-04-09 DIAGNOSIS — R0902 Hypoxemia: Secondary | ICD-10-CM | POA: Diagnosis not present

## 2021-04-09 DIAGNOSIS — C211 Malignant neoplasm of anal canal: Secondary | ICD-10-CM | POA: Diagnosis not present

## 2021-04-09 DIAGNOSIS — I270 Primary pulmonary hypertension: Secondary | ICD-10-CM | POA: Diagnosis not present

## 2021-04-15 DIAGNOSIS — Z853 Personal history of malignant neoplasm of breast: Secondary | ICD-10-CM | POA: Diagnosis not present

## 2021-04-15 DIAGNOSIS — C210 Malignant neoplasm of anus, unspecified: Secondary | ICD-10-CM | POA: Diagnosis not present

## 2021-04-15 DIAGNOSIS — K625 Hemorrhage of anus and rectum: Secondary | ICD-10-CM | POA: Diagnosis not present

## 2021-04-15 DIAGNOSIS — K649 Unspecified hemorrhoids: Secondary | ICD-10-CM | POA: Diagnosis not present

## 2021-04-15 DIAGNOSIS — Z08 Encounter for follow-up examination after completed treatment for malignant neoplasm: Secondary | ICD-10-CM | POA: Diagnosis not present

## 2021-04-16 ENCOUNTER — Other Ambulatory Visit: Payer: Self-pay | Admitting: Student in an Organized Health Care Education/Training Program

## 2021-04-16 DIAGNOSIS — M542 Cervicalgia: Secondary | ICD-10-CM

## 2021-04-16 DIAGNOSIS — G8929 Other chronic pain: Secondary | ICD-10-CM

## 2021-04-16 DIAGNOSIS — G894 Chronic pain syndrome: Secondary | ICD-10-CM

## 2021-04-28 ENCOUNTER — Encounter: Payer: Self-pay | Admitting: Student in an Organized Health Care Education/Training Program

## 2021-04-28 ENCOUNTER — Telehealth: Payer: Self-pay | Admitting: Student in an Organized Health Care Education/Training Program

## 2021-04-28 NOTE — Telephone Encounter (Signed)
Please advise 

## 2021-04-29 ENCOUNTER — Other Ambulatory Visit: Payer: Self-pay

## 2021-04-29 ENCOUNTER — Encounter: Payer: Self-pay | Admitting: Student in an Organized Health Care Education/Training Program

## 2021-04-29 ENCOUNTER — Ambulatory Visit
Payer: Medicare Other | Attending: Student in an Organized Health Care Education/Training Program | Admitting: Student in an Organized Health Care Education/Training Program

## 2021-04-29 DIAGNOSIS — M47818 Spondylosis without myelopathy or radiculopathy, sacral and sacrococcygeal region: Secondary | ICD-10-CM

## 2021-04-29 DIAGNOSIS — M533 Sacrococcygeal disorders, not elsewhere classified: Secondary | ICD-10-CM | POA: Diagnosis not present

## 2021-04-29 DIAGNOSIS — M542 Cervicalgia: Secondary | ICD-10-CM

## 2021-04-29 DIAGNOSIS — G894 Chronic pain syndrome: Secondary | ICD-10-CM

## 2021-04-29 DIAGNOSIS — M5412 Radiculopathy, cervical region: Secondary | ICD-10-CM

## 2021-04-29 DIAGNOSIS — G8929 Other chronic pain: Secondary | ICD-10-CM | POA: Diagnosis not present

## 2021-04-29 DIAGNOSIS — M5416 Radiculopathy, lumbar region: Secondary | ICD-10-CM | POA: Diagnosis not present

## 2021-04-29 MED ORDER — TRAMADOL HCL 50 MG PO TABS: 50.0000 mg | ORAL_TABLET | Freq: Four times a day (QID) | ORAL | 5 refills | Status: DC | PRN

## 2021-04-29 MED ORDER — TIZANIDINE HCL 4 MG PO TABS: 4.0000 mg | ORAL_TABLET | Freq: Three times a day (TID) | ORAL | 2 refills | Status: DC | PRN

## 2021-04-29 MED ORDER — GABAPENTIN 300 MG PO CAPS: ORAL_CAPSULE | ORAL | 2 refills | Status: DC

## 2021-04-29 NOTE — Progress Notes (Signed)
Patient: Judy Allen  Service Category: E/M  Provider: Gillis Santa, MD  DOB: 03/03/1956  DOS: 04/29/2021  Location: Office  MRN: [id number]  Setting: Ambulatory outpatient  Referring Provider: Steele Sizer, MD  Type: Established Patient  Specialty: Interventional Pain Management  PCP: Steele Sizer, MD  Location: Remote location  Delivery: TeleHealth     Virtual Encounter - Pain Management PROVIDER NOTE: Information contained herein reflects review and annotations entered in association with encounter. Interpretation of such information and data should be left to medically-trained personnel. Information provided to patient can be located elsewhere in the medical record under "Patient Instructions". Document created using STT-dictation technology, any transcriptional errors that may result from process are unintentional.    Contact & Pharmacy Preferred: [phone] Home: [phone] (home) Mobile: [phone] (mobile) E-mail: [email or web address]  [facility]  ([place], [place] [facility] [address] [place] [address] Phone: [phone] Fax: [phone]  [facility] ([facility]) - [place], [place] [address] Phone: [phone] Fax: [phone]   Pre-screening  Ms. Lasalle offered "in-person" vs "virtual" encounter. She indicated preferring virtual for this encounter.   Reason COVID-19*  Social distancing based on CDC and AMA recommendations.   I contacted Judy Allen on 04/29/2021 via telephone.      I clearly identified myself as Gillis Santa, MD. I verified that I was speaking with the correct person using two identifiers (Name: AMYRIAH BURAS, and date of birth: 1956-02-09).  Consent I sought verbal advanced consent from Judy Allen for virtual visit interactions. I informed Ms. Heinzel of possible security and privacy  concerns, risks, and limitations associated with providing "not-in-person" medical evaluation and management services. I also informed Ms. Rowzee of the availability of "in-person" appointments. Finally, I informed her that there would be a charge for the virtual visit and that she could be  personally, fully or partially, financially responsible for it. Ms. Raether expressed understanding and agreed to proceed.   Historic Elements   Ms. LAKETTA SODERBERG is a 65 y.o. year old, female patient evaluated today after our last contact on 04/28/2021. Ms. Silberstein  has a past medical history of Anemia, Anxiety, Arthritis, Colon cancer ([place]) (0263), Complication of anesthesia, Constipation, COPD (chronic obstructive pulmonary disease) ([place]), Depression, Diabetes mellitus without complication ([place]), Dysrhythmia, Emphysema of lung ([place]), GERD (gastroesophageal reflux disease), H/O allergic rhinitis, Heart murmur, Hemorrhoid, Hyperlipidemia, Hypertension, Macular degeneration, Neck pain, chronic, Neuritis, Ovarian failure, Personal history of chemotherapy, Personal history of radiation therapy, PONV (postoperative nausea and vomiting), Pre-diabetes (2019), Proteinuria, Psoriasis, Sleep apnea (09/12/2016), Tachycardia, paroxysmal ([place]), and Urinary incontinence. She also  has a past surgical history that includes Tubal ligation; tonsillectomy (7858); herniated disc repair (2001); Anterior fusion lumbar spine (1990); Neck surgery; Mass excision (Left, 02/23/2016); Colonoscopy (2018); Portacath placement (Left, 03/12/2016); Cardiac catheterization (2009? ); Cardiac catheterization (05/2012); Port-a-cath removal (2018); Liver biopsy (N/A, 09/12/2017); Cholecystectomy (N/A, 09/12/2017); Trigger finger release (Right, 09/2018); and Spine surgery. Ms. Guess has a current medication list which includes the following prescription(s): albuterol, amlodipine, anoro ellipta, aripiprazole, blood glucose meter kit and supplies, contour next test,  gemfibrozil, hydrocortisone, metoprolol succinate, microlet lancets, omega-3 acid ethyl esters, omeprazole, oxygen-helium, rosuvastatin, telmisartan, torsemide, venlafaxine xr, vitamin d (ergocalciferol), amiloride, fluconazole, gabapentin, tizanidine, and [START ON 05/11/2021] tramadol. She  reports that she quit smoking about 15 years ago. Her smoking use included cigarettes.  She started smoking about 57 years ago. She has a 32.00 pack-year smoking history. She has never used smokeless tobacco. She reports that she does not drink alcohol and does not use drugs. Ms. Moline is allergic to dapsone, hydrochlorothiazide, sulfa antibiotics, and sulfasalazine.   HPI  Today, she is being contacted for medication management.  Patient states that she has been experiencing really bad vertigo for the last 2 weeks.  She is having trouble walking.  For this reason, we elected to do a virtual visit as the patient was having difficulty arranging for transportation to come in. From a pain standpoint, her pain is well managed on her current regimen of tramadol 50 mg every 6 hours as needed, gabapentin 300 mg twice a day along with tizanidine as needed for muscle spasms.  We will refill as below.  Pharmacotherapy Assessment   Analgesic: Tramadol 50 mg 4 times daily as needed, quantity 120/month; MME equals 20    Monitoring: [place] PMP: PDMP reviewed during this encounter.       Pharmacotherapy: No side-effects or adverse reactions reported. Compliance: No problems identified. Effectiveness: Clinically acceptable. Plan: Refer to "POC". UDS:  Summary  Date Value Ref Range Status  05/13/2020 Note  Final    Comment:    ==================================================================== [employer] Select 13 (MW) ==================================================================== Test                             Result       Flag       Units  Drug Present   Tramadol                       >4762                    ng/mg creat   O-Desmethyltramadol            >4762                   ng/mg creat   N-Desmethyltramadol            2534                    ng/mg creat    Source of tramadol is a prescription medication. O-desmethyltramadol    and N-desmethyltramadol are expected metabolites of tramadol.  ==================================================================== Test                      Result    Flag   Units      Ref Range   Creatinine              105              mg/dL      >=20 ==================================================================== Declared Medications:  Medication list was not provided. ==================================================================== For clinical consultation, please call [phone]. ====================================================================      Laboratory Chemistry Profile   Renal Lab Results  Component Value Date   BUN 18 04/23/2019   CREATININE 0.93 04/23/2019   LABCREA 128 20/35/5974   BCR NOT APPLICABLE 16/38/4536   GFRAA 75 04/23/2019   GFRNONAA 65 04/23/2019    Hepatic Lab Results  Component Value Date   AST 37 12/12/2019   ALT 40 (H) 12/12/2019   ALBUMIN 5.2 (H) 12/12/2019   ALKPHOS 70 12/12/2019   LIPASE 27 08/03/2017    Electrolytes Lab Results  Component Value Date  NA 145 04/23/2019   K 4.7 04/23/2019   CL 104 04/23/2019   CALCIUM 10.6 (H) 04/23/2019    Bone Lab Results  Component Value Date   VD25OH 14 (L) 04/23/2019    Inflammation (CRP: Acute Phase) (ESR: Chronic Phase) No results found for: CRP, ESRSEDRATE, LATICACIDVEN       Note: Above Lab results reviewed.  Imaging  MR LUMBAR SPINE WO CONTRAST CLINICAL DATA:  lumbosacral Low back pain, > 6 wks Lumbar radiculopathy  EXAM: MRI LUMBAR SPINE WITHOUT CONTRAST  TECHNIQUE: Multiplanar, multisequence MR imaging of the lumbar spine was performed. No intravenous contrast was administered.  COMPARISON:  02/28/2015 MRI lumbar  spine.  FINDINGS: Segmentation:  Standard.  Alignment:  Normal alignment.  Vertebrae: Mild bone marrow heterogeneity with multilevel Modic type 2 endplate degenerative changes and osteophytosis. L4 hemangioma. Vertebral body heights are preserved. No fracture or evidence of discitis. No pars defect.  Conus medullaris and cauda equina: Conus extends to the L2 level. Conus and cauda equina appear normal.  Disc levels: Multilevel desiccation and disc space loss most prominent at the L5-S1 level.  T12-L1: No significant disc bulge. Bilateral facet degenerative spurring. Patent spinal canal and neural foramen.  L1-2: Mild disc bulge with shallow superimposed left foraminal protrusion. Prominent ligamentum flavum and bilateral facet hypertrophy. Partial effacement of the ventral CSF containing spaces. Patent spinal canal and neural foramen.  L2-3: Disc bulge with shallow central protrusion, prominent ligamentum flavum and bilateral facet degenerative spurring. Patent left neural foramen. Mild right neural foraminal narrowing. Mild spinal canal narrowing with thecal sac measuring 10.4 mm in AP dimension.  L3-4: Disc bulge with shallow superimposed right foraminal protrusion. Ligamentum flavum thickening and bilateral facet hypertrophy. Partial effacement of the lateral recesses. Patent spinal canal with the thecal sac measuring 13.3 mm in AP dimension. Mild right and moderate left neural foraminal narrowing.  L4-5: Disc bulge with shallow superimposed central and right foraminal protrusions. Prominent ligamentum flavum and bilateral facet hypertrophy. Mild spinal canal narrowing with the thecal sac measuring 11 mm in AP dimension. Mild left greater than right neural foraminal narrowing.  L5-S1: Minimal disc bulge with shallow central protrusion. Sequela of left laminotomy. There is no significant fibrosis/scarring. Patent spinal canal and bilateral neural foramen.  Paraspinal  and other soft tissues: Duplicated IVC.  IMPRESSION: Multilevel spondylosis, grossly unchanged since prior exam.  Moderate left L3-4 neural foraminal narrowing. Mild L4-5 spinal canal narrowing.  Mild right L2-4 and bilateral L4-5 neural foraminal narrowing.  Sequela of prior left laminotomy at the L5-S1 level. No significant fibrosis or scarring.  Electronically Signed   By: Primitivo Gauze M.D.   On: 06/19/2020 08:33  Assessment  The primary encounter diagnosis was Chronic pain syndrome. Diagnoses of Chronic cervical pain, Disorder of SI (sacroiliac) joint, Arthritis of sacroiliac joint of both sides, Cervical radiculopathy (left), and Lumbar radiculopathy were also pertinent to this visit.  Plan of Care    [name] has a current medication list which includes the following long-term medication(s): amlodipine, aripiprazole, omega-3 acid ethyl esters, omeprazole, rosuvastatin, telmisartan, torsemide, venlafaxine xr, amiloride, gabapentin, and [START ON 05/11/2021] tramadol.  Pharmacotherapy (Medications Ordered): Meds ordered this encounter  Medications   traMADol (ULTRAM) 50 MG tablet    Sig: Take 1 tablet (50 mg total) by mouth every 6 (six) hours as needed for severe pain. Each fill must last 30 days    Dispense:  120 tablet    Refill:  5    [place] STOP ACT -  Not applicable. Fill one day early if pharmacy is closed on scheduled refill date.   tiZANidine (ZANAFLEX) 4 MG tablet    Sig: Take 1 tablet (4 mg total) by mouth every 8 (eight) hours as needed.    Dispense:  270 tablet    Refill:  2   gabapentin (NEURONTIN) 300 MG capsule    Sig: TAKE 1 CAPSULE(300 MG) BY MOUTH THREE TIMES DAILY    Dispense:  270 capsule    Refill:  2    Follow-up plan:   Return in about 6 months (around 10/27/2021) for Medication Management, in person.    Recent Visits No visits were found meeting these conditions. Showing recent visits within past 90 days and meeting all other  requirements Today's Visits Date Type Provider Dept  04/29/21 Office Visit Gillis Santa, MD [facility]  Showing today's visits and meeting all other requirements Future Appointments No visits were found meeting these conditions. Showing future appointments within next 90 days and meeting all other requirements I discussed the assessment and treatment plan with the patient. The patient was provided an opportunity to ask questions and all were answered. The patient agreed with the plan and demonstrated an understanding of the instructions.  Patient advised to call back or seek an in-person evaluation if the symptoms or condition worsens.  Duration of encounter: 70mnutes.  Note by: BGillis Santa MD Date: 04/29/2021; Time: 3:03 PM

## 2021-05-03 DIAGNOSIS — R42 Dizziness and giddiness: Secondary | ICD-10-CM | POA: Diagnosis not present

## 2021-05-03 DIAGNOSIS — E119 Type 2 diabetes mellitus without complications: Secondary | ICD-10-CM | POA: Diagnosis not present

## 2021-05-03 DIAGNOSIS — Z882 Allergy status to sulfonamides status: Secondary | ICD-10-CM | POA: Diagnosis not present

## 2021-05-03 DIAGNOSIS — J9611 Chronic respiratory failure with hypoxia: Secondary | ICD-10-CM | POA: Diagnosis not present

## 2021-05-03 DIAGNOSIS — Z87891 Personal history of nicotine dependence: Secondary | ICD-10-CM | POA: Diagnosis not present

## 2021-05-03 DIAGNOSIS — I10 Essential (primary) hypertension: Secondary | ICD-10-CM | POA: Diagnosis not present

## 2021-05-03 DIAGNOSIS — R918 Other nonspecific abnormal finding of lung field: Secondary | ICD-10-CM | POA: Diagnosis not present

## 2021-05-03 DIAGNOSIS — N170 Acute kidney failure with tubular necrosis: Secondary | ICD-10-CM | POA: Insufficient documentation

## 2021-05-03 DIAGNOSIS — I952 Hypotension due to drugs: Secondary | ICD-10-CM | POA: Insufficient documentation

## 2021-05-03 DIAGNOSIS — Z9981 Dependence on supplemental oxygen: Secondary | ICD-10-CM | POA: Diagnosis not present

## 2021-05-03 DIAGNOSIS — E860 Dehydration: Secondary | ICD-10-CM | POA: Diagnosis not present

## 2021-05-03 DIAGNOSIS — N179 Acute kidney failure, unspecified: Secondary | ICD-10-CM | POA: Diagnosis not present

## 2021-05-03 DIAGNOSIS — J439 Emphysema, unspecified: Secondary | ICD-10-CM | POA: Diagnosis not present

## 2021-05-03 DIAGNOSIS — T502X5A Adverse effect of carbonic-anhydrase inhibitors, benzothiadiazides and other diuretics, initial encounter: Secondary | ICD-10-CM | POA: Diagnosis not present

## 2021-05-03 DIAGNOSIS — I959 Hypotension, unspecified: Secondary | ICD-10-CM | POA: Diagnosis not present

## 2021-05-03 DIAGNOSIS — E781 Pure hyperglyceridemia: Secondary | ICD-10-CM | POA: Diagnosis not present

## 2021-05-03 DIAGNOSIS — C211 Malignant neoplasm of anal canal: Secondary | ICD-10-CM | POA: Diagnosis not present

## 2021-05-03 DIAGNOSIS — Z79899 Other long term (current) drug therapy: Secondary | ICD-10-CM | POA: Diagnosis not present

## 2021-05-03 DIAGNOSIS — R000 Tachycardia, unspecified: Secondary | ICD-10-CM | POA: Diagnosis not present

## 2021-05-03 DIAGNOSIS — G8929 Other chronic pain: Secondary | ICD-10-CM | POA: Diagnosis not present

## 2021-05-03 DIAGNOSIS — N390 Urinary tract infection, site not specified: Secondary | ICD-10-CM | POA: Diagnosis not present

## 2021-05-03 DIAGNOSIS — L409 Psoriasis, unspecified: Secondary | ICD-10-CM | POA: Diagnosis not present

## 2021-05-03 DIAGNOSIS — K219 Gastro-esophageal reflux disease without esophagitis: Secondary | ICD-10-CM | POA: Diagnosis not present

## 2021-05-03 DIAGNOSIS — I491 Atrial premature depolarization: Secondary | ICD-10-CM | POA: Diagnosis not present

## 2021-05-03 DIAGNOSIS — J449 Chronic obstructive pulmonary disease, unspecified: Secondary | ICD-10-CM | POA: Diagnosis not present

## 2021-05-03 DIAGNOSIS — Z20822 Contact with and (suspected) exposure to covid-19: Secondary | ICD-10-CM | POA: Diagnosis not present

## 2021-05-04 DIAGNOSIS — G8929 Other chronic pain: Secondary | ICD-10-CM | POA: Diagnosis not present

## 2021-05-04 DIAGNOSIS — I10 Essential (primary) hypertension: Secondary | ICD-10-CM | POA: Diagnosis not present

## 2021-05-04 DIAGNOSIS — N170 Acute kidney failure with tubular necrosis: Secondary | ICD-10-CM | POA: Diagnosis not present

## 2021-05-04 DIAGNOSIS — J9611 Chronic respiratory failure with hypoxia: Secondary | ICD-10-CM | POA: Diagnosis not present

## 2021-05-04 DIAGNOSIS — N390 Urinary tract infection, site not specified: Secondary | ICD-10-CM | POA: Diagnosis not present

## 2021-05-04 DIAGNOSIS — J449 Chronic obstructive pulmonary disease, unspecified: Secondary | ICD-10-CM | POA: Diagnosis not present

## 2021-05-04 DIAGNOSIS — N179 Acute kidney failure, unspecified: Secondary | ICD-10-CM | POA: Diagnosis not present

## 2021-05-05 DIAGNOSIS — N179 Acute kidney failure, unspecified: Secondary | ICD-10-CM | POA: Diagnosis not present

## 2021-05-05 DIAGNOSIS — I959 Hypotension, unspecified: Secondary | ICD-10-CM | POA: Diagnosis not present

## 2021-05-05 DIAGNOSIS — E860 Dehydration: Secondary | ICD-10-CM | POA: Diagnosis not present

## 2021-05-05 DIAGNOSIS — N170 Acute kidney failure with tubular necrosis: Secondary | ICD-10-CM | POA: Diagnosis not present

## 2021-05-05 DIAGNOSIS — I10 Essential (primary) hypertension: Secondary | ICD-10-CM | POA: Diagnosis not present

## 2021-05-07 ENCOUNTER — Encounter: Payer: Medicare Other | Admitting: Student in an Organized Health Care Education/Training Program

## 2021-05-09 DIAGNOSIS — I270 Primary pulmonary hypertension: Secondary | ICD-10-CM | POA: Diagnosis not present

## 2021-05-09 DIAGNOSIS — R0902 Hypoxemia: Secondary | ICD-10-CM | POA: Diagnosis not present

## 2021-05-09 DIAGNOSIS — C211 Malignant neoplasm of anal canal: Secondary | ICD-10-CM | POA: Diagnosis not present

## 2021-05-11 ENCOUNTER — Encounter: Payer: Self-pay | Admitting: Family Medicine

## 2021-05-11 ENCOUNTER — Telehealth (INDEPENDENT_AMBULATORY_CARE_PROVIDER_SITE_OTHER): Payer: Medicare Other | Admitting: Family Medicine

## 2021-05-11 VITALS — BP 134/80 | HR 88 | Ht 64.0 in | Wt 191.5 lb

## 2021-05-11 DIAGNOSIS — I479 Paroxysmal tachycardia, unspecified: Secondary | ICD-10-CM

## 2021-05-11 DIAGNOSIS — I10 Essential (primary) hypertension: Secondary | ICD-10-CM

## 2021-05-11 NOTE — Patient Instructions (Signed)
It was great to see you!  Our plans for today:  - Continue to only take metoprolol for your blood pressure.  - Come by the lab for lab work whenever is good for you.  Take care and seek immediate care sooner if you develop any concerns.   Dr. Ky Barban

## 2021-05-11 NOTE — Assessment & Plan Note (Signed)
Stable despite dose decrease of metoprolol, continue to monitor.

## 2021-05-11 NOTE — Assessment & Plan Note (Addendum)
Hypotension improved since d/c of temisartan, amlodipine and torsemide. Continue current regimen. Recommend lab appt for BMP.

## 2021-05-11 NOTE — Progress Notes (Signed)
  Virtual Visit via Video Note  I connected with Judy Allen on 05/11/21 at  2:40 PM EDT by a video enabled telemedicine application and verified that I am speaking with the correct person using two identifiers.  Location: Patient: home Provider: [facility]   I discussed the limitations of evaluation and management by telemedicine and the availability of in person appointments. The patient expressed understanding and agreed to proceed.  History of Present Illness:  ER FOLLOW UP Hospital/facility: [facility] 05/03/21 Diagnosis: hypotension, AKI 2/2 medication, dehydration Procedures/tests: IV Fluids Consultants: nephrology New medications:  - d/c telmisartan, torsemide - decreased metoprolol to 89m Discharge instructions:   Status: better - SBP 113-140s. HR 90s - occasional orthostatic symptoms. - denies LE swelling, SOB. - urinating well. - flank pain better   Observations/Objective:  Well appearing, in NAD.  Assessment and Plan:  Benign hypertension Hypotension improved since d/c of temisartan, amlodipine and torsemide. Continue current regimen. Recommend lab appt for BMP.   Tachycardia, paroxysmal Stable despite dose decrease of metoprolol, continue to monitor.    I discussed the assessment and treatment plan with the patient. The patient was provided an opportunity to ask questions and all were answered. The patient agreed with the plan and demonstrated an understanding of the instructions.   The patient was advised to call back or seek an in-person evaluation if the symptoms worsen or if the condition fails to improve as anticipated.  I provided 6 minutes of non-face-to-face time during this encounter.   AMyles Gip DO

## 2021-05-13 DIAGNOSIS — I10 Essential (primary) hypertension: Secondary | ICD-10-CM | POA: Diagnosis not present

## 2021-05-14 LAB — BASIC METABOLIC PANEL
BUN/Creatinine Ratio: 17 (calc) (ref 6–22)
BUN: 20 mg/dL (ref 7–25)
CO2: 25 mmol/L (ref 20–32)
Calcium: 10 mg/dL (ref 8.6–10.4)
Chloride: 107 mmol/L (ref 98–110)
Creat: 1.19 mg/dL — ABNORMAL HIGH (ref 0.50–1.05)
Glucose, Bld: 81 mg/dL (ref 65–99)
Potassium: 4.6 mmol/L (ref 3.5–5.3)
Sodium: 143 mmol/L (ref 135–146)

## 2021-05-22 DIAGNOSIS — N179 Acute kidney failure, unspecified: Secondary | ICD-10-CM | POA: Diagnosis not present

## 2021-05-22 DIAGNOSIS — I10 Essential (primary) hypertension: Secondary | ICD-10-CM | POA: Diagnosis not present

## 2021-05-22 DIAGNOSIS — E213 Hyperparathyroidism, unspecified: Secondary | ICD-10-CM | POA: Diagnosis not present

## 2021-05-27 ENCOUNTER — Encounter: Payer: Self-pay | Admitting: Student in an Organized Health Care Education/Training Program

## 2021-05-27 ENCOUNTER — Other Ambulatory Visit: Payer: Self-pay | Admitting: Student in an Organized Health Care Education/Training Program

## 2021-05-27 DIAGNOSIS — G8929 Other chronic pain: Secondary | ICD-10-CM

## 2021-05-27 DIAGNOSIS — G894 Chronic pain syndrome: Secondary | ICD-10-CM

## 2021-05-28 ENCOUNTER — Other Ambulatory Visit: Payer: Self-pay | Admitting: Student in an Organized Health Care Education/Training Program

## 2021-05-28 DIAGNOSIS — G8929 Other chronic pain: Secondary | ICD-10-CM

## 2021-05-28 DIAGNOSIS — G894 Chronic pain syndrome: Secondary | ICD-10-CM

## 2021-05-28 DIAGNOSIS — M542 Cervicalgia: Secondary | ICD-10-CM

## 2021-05-28 MED ORDER — GABAPENTIN 300 MG PO CAPS: ORAL_CAPSULE | ORAL | 2 refills | Status: DC

## 2021-05-28 MED ORDER — TIZANIDINE HCL 4 MG PO TABS: 4.0000 mg | ORAL_TABLET | Freq: Three times a day (TID) | ORAL | 2 refills | Status: DC | PRN

## 2021-06-01 ENCOUNTER — Other Ambulatory Visit: Payer: Self-pay | Admitting: Student in an Organized Health Care Education/Training Program

## 2021-06-01 DIAGNOSIS — G8929 Other chronic pain: Secondary | ICD-10-CM

## 2021-06-01 DIAGNOSIS — G894 Chronic pain syndrome: Secondary | ICD-10-CM

## 2021-06-01 MED ORDER — TIZANIDINE HCL 4 MG PO TABS: 4.0000 mg | ORAL_TABLET | Freq: Three times a day (TID) | ORAL | 2 refills | Status: DC | PRN

## 2021-06-01 MED ORDER — GABAPENTIN 300 MG PO CAPS: ORAL_CAPSULE | ORAL | 2 refills | Status: DC

## 2021-06-01 NOTE — Progress Notes (Signed)
Patient notified

## 2021-06-08 DIAGNOSIS — N179 Acute kidney failure, unspecified: Secondary | ICD-10-CM | POA: Diagnosis not present

## 2021-06-08 DIAGNOSIS — E213 Hyperparathyroidism, unspecified: Secondary | ICD-10-CM | POA: Diagnosis not present

## 2021-06-08 DIAGNOSIS — J9611 Chronic respiratory failure with hypoxia: Secondary | ICD-10-CM | POA: Diagnosis not present

## 2021-06-08 DIAGNOSIS — I088 Other rheumatic multiple valve diseases: Secondary | ICD-10-CM | POA: Diagnosis not present

## 2021-06-08 DIAGNOSIS — Z9981 Dependence on supplemental oxygen: Secondary | ICD-10-CM | POA: Diagnosis not present

## 2021-06-08 DIAGNOSIS — J438 Other emphysema: Secondary | ICD-10-CM | POA: Diagnosis not present

## 2021-06-08 DIAGNOSIS — I5189 Other ill-defined heart diseases: Secondary | ICD-10-CM

## 2021-06-08 DIAGNOSIS — I10 Essential (primary) hypertension: Secondary | ICD-10-CM | POA: Diagnosis not present

## 2021-06-08 DIAGNOSIS — I272 Pulmonary hypertension, unspecified: Secondary | ICD-10-CM | POA: Diagnosis not present

## 2021-06-08 DIAGNOSIS — I340 Nonrheumatic mitral (valve) insufficiency: Secondary | ICD-10-CM | POA: Diagnosis not present

## 2021-06-08 HISTORY — DX: Other ill-defined heart diseases: I51.89

## 2021-06-09 DIAGNOSIS — I270 Primary pulmonary hypertension: Secondary | ICD-10-CM | POA: Diagnosis not present

## 2021-06-09 DIAGNOSIS — C211 Malignant neoplasm of anal canal: Secondary | ICD-10-CM | POA: Diagnosis not present

## 2021-06-09 DIAGNOSIS — R0902 Hypoxemia: Secondary | ICD-10-CM | POA: Diagnosis not present

## 2021-06-23 DIAGNOSIS — L400 Psoriasis vulgaris: Secondary | ICD-10-CM | POA: Diagnosis not present

## 2021-07-09 DIAGNOSIS — R0902 Hypoxemia: Secondary | ICD-10-CM | POA: Diagnosis not present

## 2021-07-09 DIAGNOSIS — C211 Malignant neoplasm of anal canal: Secondary | ICD-10-CM | POA: Diagnosis not present

## 2021-07-09 DIAGNOSIS — I270 Primary pulmonary hypertension: Secondary | ICD-10-CM | POA: Diagnosis not present

## 2021-07-10 DIAGNOSIS — E213 Hyperparathyroidism, unspecified: Secondary | ICD-10-CM | POA: Diagnosis not present

## 2021-07-10 DIAGNOSIS — I10 Essential (primary) hypertension: Secondary | ICD-10-CM | POA: Diagnosis not present

## 2021-07-10 DIAGNOSIS — N179 Acute kidney failure, unspecified: Secondary | ICD-10-CM | POA: Diagnosis not present

## 2021-07-17 ENCOUNTER — Other Ambulatory Visit: Payer: Self-pay | Admitting: Family Medicine

## 2021-07-17 DIAGNOSIS — E781 Pure hyperglyceridemia: Secondary | ICD-10-CM

## 2021-07-21 NOTE — Progress Notes (Signed)
Name: Judy Allen   MRN: [id number]    DOB: [birth year]   Date:07/22/2021       Progress Note  Subjective  Chief Complaint  Follow up   HPI  Hypertriglyceridemiareviewed last labs done in her office 04/2020 . Currently taking Crestor , Lovaza and Gemfibrozil , denies side effects. We will recheck labs today    HTN/Paroxysmal tachycardia: . She is currently taking Norvasc 2.5 , Telmisartan 40 Metoprolol 14m , she is under the care of Dr. VSmith Mince- nephrologist. BP at home has been 131-175/77-102, usually mid 150's. Dr. VSmith Mincementioned in her note to consider resuming demadex 5 mg if edema, but it may also help with bp.     MDD: she is now back on 225 mg of Effexor and Abilify.  Off Depakote and Wellbutrin because of high bp. She states panic attacks resolved.  She still has lack of motivation, still living with [place]. She continues to do puzzles on her iPad, she enjoy some TV shows.  She is feeling much better    Hyperparathyroidism: under the care of Dr. SGabriel Carinabut no recent visits and last calcium slightly up but stable, last pht was done 09/21 and we will repeat it today, advised to follow up with Endo. She has been off HCTZ    Emphysema and hypoxemia: on oxygen 2  liters - 24 hours per day sob is stable and only with activity, she denies coughing,  but has intermittent wheezing.[facility] for another sleep study, she could not tolerate CPAP in the past    [facility] of [facility] visit [facility] completed 5 year follow up.  She denies change in bowel movements of blood in stools.    DMII: diagnosed 02/2018 by Dr. SGabriel Carina hgbA1C was at 6.6%, A1C  but has been off Metformin and A1C has been within normal limits.  She has diabetes neuropathy - pain on both feet . She also has dyslipidemia. A1C has been within normal limits, she is avoiding sweets  Continue Gabapentin for foot pain and it helps some    Chronic pain: under the care of Dr. LHolley Raring she states she is doing well on Gabapentin , Tramadol, also taking muscle relaxer . Pain is worse on her neck, but also has back pain, history of laminectomy. Back pain right now is zero and on her neck pain is 1/10   Senile purpura: she states not as bad now, still bruises on both arms. Reassurance given   Psoriasis: she is doing well, no current rashes . Seeing Dermatologist and under control at this time. Using an injection every 3 months and not sure of the name  Patient Active Problem List   Diagnosis Date Noted   Hypotension due to drugs 05/03/2021   Disorder of SI (sacroiliac) joint 11/11/2020   Arthritis of sacroiliac joint of both sides 11/11/2020   Chronic radicular lumbar pain 11/11/2020   [place]par[place]roidism ([place] 08/25/2019   Diabetes mellitus type 2 in obese ([place] 11/06/2018   Cervical radiculopathy (left) 08/31/2018   Osteoarthritis of spine with radiculopathy, cervical region 08/31/2018   History of anal cancer 05/30/2018   Myofascial pain syndrome, cervical 05/16/2018   Chronic pain syndrome 05/16/2018   Cervical facet joint syndrome 05/16/2018   Perihepatitis ([place] 09/27/2017   Fatty liver 12/22/2016   Chronic respiratory failure with hypoxia, on home oxygen therapy ([place] 07/28/2016   Centrilobular emphysema ([place] 07/14/2016  Abnormal Pap smear of cervix 04/14/2016   Non-thrombocytopenic purpura (HCC) 01/12/2016   Chronic constipation 10/16/2015   History of fusion of cervical spine 04/02/2015   Benign hypertension 01/16/2015   Chronic cervical pain 01/16/2015   CN (constipation) 01/16/2015   Gastric reflux 01/16/2015   Hypertriglyceridemia 01/16/2015   Edema leg 01/16/2015   Chronic recurrent major depressive disorder ([place]) 89/37/3428   Dysmetabolic syndrome 76/81/1572   Obstructive apnea 01/16/2015   Psoriasis 01/16/2015   Allergic rhinitis 01/16/2015   Bursitis, trochanteric 01/16/2015   GAD (generalized  anxiety disorder) 01/16/2015   Tachycardia, paroxysmal ([place])     Past Surgical History:  Procedure Laterality Date   ANTERIOR FUSION LUMBAR SPINE  1990   plate in back, not metal   CARDIAC CATHETERIZATION  2009?    [facility]   CARDIAC CATHETERIZATION  05/2012   [facility]: Mild hypertension 37/12 with a mean pressure of 21 mm mercury   CHOLECYSTECTOMY N/A 09/12/2017   Procedure: LAPAROSCOPIC CHOLECYSTECTOMY WITH INTRAOPERATIVE CHOLANGIOGRAM;  Surgeon: Robert Bellow, MD;  Location: [facility] ORS;  Service: General;  Laterality: N/A;   COLONOSCOPY  2018   herniated disc repair  2001   LIVER BIOPSY N/A 09/12/2017   Procedure: LIVER BIOPSY;  Surgeon: Robert Bellow, MD;  Location: [facility] ORS;  Service: General;  Laterality: N/A;   MASS EXCISION Left 02/23/2016   Procedure: EXCISION MASS;  Surgeon: Christene Lye, MD;  Location: [facility] ORS;  Service: General;  Laterality: Left;   NECK SURGERY     PORT-A-CATH REMOVAL  2018   PORTACATH PLACEMENT Left 03/12/2016   Procedure: INSERTION PORT-A-CATH;  Surgeon: Christene Lye, MD;  Location: [facility] ORS;  Service: General;  Laterality: Left;   SPINE SURGERY     tonsillectomy  1959   history   TRIGGER FINGER RELEASE Right 09/2018   Dr. Sabra Heck    TUBAL LIGATION     s/p    Family History  Problem Relation Age of Onset   Heart attack Mother    Asthma Mother    Liver cancer Father    Breast cancer Maternal Aunt    Bladder Cancer Neg Hx    Kidney cancer Neg Hx    Colon cancer Neg Hx     Social History   Tobacco Use   Smoking status: Former    Packs/day: 1.00    Years: 32.00    Pack years: 32.00    Types: Cigarettes    Start date: 08/10/1963    Quit date: 01/15/2006    Years since quitting: 15.5   Smokeless tobacco: Never  Substance Use Topics   Alcohol use: No    Alcohol/week: 0.0 standard drinks     Current Outpatient Medications:    albuterol (PROVENTIL HFA;VENTOLIN HFA) 108 (90 Base) MCG/ACT inhaler, Inhale 2 puffs into the  lungs every 6 (six) hours as needed for wheezing or shortness of breath., Disp: , Rfl:    amLODipine (NORVASC) 2.5 MG tablet, Take 1 tablet by mouth daily., Disp: , Rfl:    ANORO ELLIPTA 62.5-25 MCG/INH AEPB, INL 1 PUFF PO D, Disp: , Rfl:    blood glucose meter kit and supplies, Dispense based on patient and insurance preference. Use up to four times daily as directed. (FOR ICD-10 E10.9, E11.9)., Disp: 1 each, Rfl: 0   CONTOUR NEXT TEST test strip, USE UP TO FOUR TIMES DAILY AS DIRECTED, Disp: 100 each, Rfl: 5   gabapentin (NEURONTIN) 300 MG capsule, TAKE 1 CAPSULE(300 MG) BY MOUTH THREE TIMES  DAILY, Disp: 270 capsule, Rfl: 2   gemfibrozil (LOPID) 600 MG tablet, Take 600 mg by mouth 2 (two) times daily., Disp: , Rfl:    hydrocortisone 2.5 % cream, Apply topically 2 (two) times daily., Disp: 30 g, Rfl: 0   Metoprolol Succinate 50 MG CS24, Take 25 mg by mouth daily., Disp: , Rfl:    Microlet Lancets MISC, USE FOUR TIMES DAILY, Disp: 100 each, Rfl: 5   omega-3 acid ethyl esters (LOVAZA) 1 g capsule, TAKE 2 CAPSULES BY MOUTH TWO TIMES A DAY, Disp: 360 capsule, Rfl: 1   omeprazole (PRILOSEC) 40 MG capsule, TAKE 1 CAPSULE BY MOUTH  DAILY, Disp: 90 capsule, Rfl: 2   OXYGEN, Inhale 2 L into the lungs continuous. , Disp: , Rfl:    telmisartan (MICARDIS) 40 MG tablet, Take 1 tablet by mouth daily., Disp: , Rfl:    tiZANidine (ZANAFLEX) 4 MG tablet, Take 1 tablet (4 mg total) by mouth every 8 (eight) hours as needed., Disp: 270 tablet, Rfl: 2   torsemide (DEMADEX) 5 MG tablet, Take 1 tablet (5 mg total) by mouth daily., Disp: 30 tablet, Rfl: 0   traMADol (ULTRAM) 50 MG tablet, Take 1 tablet (50 mg total) by mouth every 6 (six) hours as needed for severe pain. Each fill must last 30 days, Disp: 120 tablet, Rfl: 5   Vitamin D, Ergocalciferol, (DRISDOL) 1.25 MG (50000 UT) CAPS capsule, Take 1 capsule (50,000 Units total) by mouth every 7 (seven) days., Disp: 12 capsule, Rfl: 1   venlafaxine XR (EFFEXOR-XR) 75 MG  24 hr capsule, TAKE 3 CAPSULES BY MOUTH  DAILY WITH BREAKFAST, Disp: 270 capsule, Rfl: 1  Allergies  Allergen Reactions   Dapsone Other (See Comments)    Hypoxemia   Hydrochlorothiazide     History of hyperparathyroidism    Sulfa Antibiotics Rash   Sulfasalazine Rash    I personally reviewed active problem list, medication list, allergies, family history, social history, health maintenance with the patient/caregiver today.   ROS  Constitutional: Negative for fever or weight change.  Respiratory: positive  for cough and shortness of breath.   Cardiovascular: Negative for chest pain or palpitations.  Gastrointestinal: Negative for abdominal pain, no bowel changes.  Musculoskeletal: Negative for gait problem or joint swelling.  Skin: Negative for rash.  Neurological: Negative for dizziness or headache.  No other specific complaints in a complete review of systems (except as listed in HPI above).   Objective  Vitals:   07/22/21 0936 07/22/21 1005  BP: (!) 162/122 (!) 142/86  Pulse: 96   Resp: 20   Temp: 98.3 F (36.8 C)   TempSrc: Oral   SpO2: 93%   Weight: 190 lb 4.8 oz (86.3 kg)   Height: _0  (1.626 m)   PF: (!) 2 L/min     Body mass index is 32.66 kg/m.  Physical Exam  Constitutional: Patient appears well-developed and well-nourished. Obese  No distress.  HEENT: head atraumatic, normocephalic, pupils equal and reactive to light,  neck supple Cardiovascular: Normal rate, regular rhythm and normal heart sounds.  No murmur heard. Trace BLE edema. Pulmonary/Chest: Effort normal and breath sounds normal. No respiratory distress. Abdominal: Soft.  There is no tenderness. Psychiatric: Patient has a normal mood and affect. behavior is normal. Judgment and thought content normal.   Recent Results (from the past 2160 hour(s))  Basic Metabolic Panel (BMET)     Status: Abnormal   Collection Time: 05/13/21  3:11 PM  Result Value Ref Range  Glucose, Bld 81 65 - 99 mg/dL     Comment: .            Fasting reference interval .    BUN 20 7 - 25 mg/dL   Creat 1.19 (H) 0.50 - 1.05 mg/dL   BUN/Creatinine Ratio 17 6 - 22 (calc)   Sodium 143 135 - 146 mmol/L   Potassium 4.6 3.5 - 5.3 mmol/L   Chloride 107 98 - 110 mmol/L   CO2 25 20 - 32 mmol/L   Calcium 10.0 8.6 - 10.4 mg/dL  POCT HgB A1C     Status: Normal   Collection Time: 07/22/21  9:40 AM  Result Value Ref Range   Hemoglobin A1C 5.5 4.0 - 5.6 %   HbA1c POC (<> result, manual entry)     HbA1c, POC (prediabetic range)     HbA1c, POC (controlled diabetic range)       PHQ2/9: Depression screen [facility] 2/9 07/22/2021 05/11/2021 01/20/2021 11/11/2020 10/17/2020  Decreased Interest 0 1 3 0 3  Down, Depressed, Hopeless 0 1 3 0 3  PHQ - 2 Score 0 2 6 0 6  Altered sleeping 0 0 0 - 0  Tired, decreased energy 1 0 0 - 3  Change in appetite 0 0 1 - 0  Feeling bad or failure about yourself  0 0 1 - 1  Trouble concentrating 0 0 0 - 0  Moving slowly or fidgety/restless 0 0 0 - 0  Suicidal thoughts 0 0 0 - 0  PHQ-9 Score _0 - 10  Difficult doing work/chores Not difficult at all Not difficult at all - - -  Some recent data might be hidden    phq 9 is negative   Fall Risk: Fall Risk  07/22/2021 05/11/2021 01/20/2021 11/11/2020 10/17/2020  Falls in the past year? 0 0 0 0 0  Number falls in past yr: - 0 0 - 0  Injury with Fall? - 0 0 - 0  Risk for fall due to : - - - - -  Follow up Falls prevention discussed - - - -     Functional Status Survey: Is the patient deaf or have difficulty hearing?: No Does the patient have difficulty seeing, even when wearing glasses/contacts?: Yes Does the patient have difficulty concentrating, remembering, or making decisions?: No Does the patient have difficulty walking or climbing stairs?: No Does the patient have difficulty dressing or bathing?: No Does the patient have difficulty doing errands alone such as visiting a doctor's office or shopping?: No    Assessment &  Plan   1. Centrilobular emphysema ([place])   2. Dyslipidemia associated with type 2 diabetes mellitus (HCC)  - Urine Microalbumin w/creat. ratio - POCT HgB A1C - HM Diabetes Foot Exam - Lipid panel  3. Chronic respiratory failure with hypoxia, on home oxygen therapy ([place])   4. Major depression in remission (HCC)  Feeling better, off Abilify  5. Hyperparathyroidism ([place])  -PTH, intact and calcium  6. Senile purpura ([place])   7. Uncontrolled hypertension  - torsemide (DEMADEX) 5 MG tablet; Take 1 tablet (5 mg total) by mouth daily.  Dispense: 30 tablet; Refill: 0  8. Breast cancer screening by mammogram  - MM Digital Screening; Future  9. Need for immunization against influenza   10. History of anal cancer   11. GAD (generalized anxiety disorder)  - venlafaxine XR (EFFEXOR-XR) 75 MG 24 hr capsule; TAKE 3 CAPSULES BY MOUTH  DAILY WITH BREAKFAST  Dispense: 270  capsule; Refill: 1  12. GERD without esophagitis   13. Hypertriglyceridemia   14. Vitamin D deficiency  - VITAMIN D 25 Hydroxy (Vit-D Deficiency, Fractures)  15. Chronic recurrent major depressive disorder (HCC)  - venlafaxine XR (EFFEXOR-XR) 75 MG 24 hr capsule; TAKE 3 CAPSULES BY MOUTH  DAILY WITH BREAKFAST  Dispense: 270 capsule; Refill: 1

## 2021-07-22 ENCOUNTER — Ambulatory Visit (INDEPENDENT_AMBULATORY_CARE_PROVIDER_SITE_OTHER): Payer: Medicare Other | Admitting: Family Medicine

## 2021-07-22 ENCOUNTER — Encounter: Payer: Self-pay | Admitting: Family Medicine

## 2021-07-22 VITALS — BP 142/86 | HR 96 | Temp 98.3°F | Resp 20 | Ht 64.0 in | Wt 190.3 lb

## 2021-07-22 DIAGNOSIS — E559 Vitamin D deficiency, unspecified: Secondary | ICD-10-CM

## 2021-07-22 DIAGNOSIS — E213 Hyperparathyroidism, unspecified: Secondary | ICD-10-CM

## 2021-07-22 DIAGNOSIS — F325 Major depressive disorder, single episode, in full remission: Secondary | ICD-10-CM

## 2021-07-22 DIAGNOSIS — D692 Other nonthrombocytopenic purpura: Secondary | ICD-10-CM | POA: Diagnosis not present

## 2021-07-22 DIAGNOSIS — Z85048 Personal history of other malignant neoplasm of rectum, rectosigmoid junction, and anus: Secondary | ICD-10-CM

## 2021-07-22 DIAGNOSIS — J432 Centrilobular emphysema: Secondary | ICD-10-CM

## 2021-07-22 DIAGNOSIS — J9611 Chronic respiratory failure with hypoxia: Secondary | ICD-10-CM | POA: Diagnosis not present

## 2021-07-22 DIAGNOSIS — Z1231 Encounter for screening mammogram for malignant neoplasm of breast: Secondary | ICD-10-CM | POA: Diagnosis not present

## 2021-07-22 DIAGNOSIS — E785 Hyperlipidemia, unspecified: Secondary | ICD-10-CM | POA: Diagnosis not present

## 2021-07-22 DIAGNOSIS — F411 Generalized anxiety disorder: Secondary | ICD-10-CM

## 2021-07-22 DIAGNOSIS — I10 Essential (primary) hypertension: Secondary | ICD-10-CM

## 2021-07-22 DIAGNOSIS — F339 Major depressive disorder, recurrent, unspecified: Secondary | ICD-10-CM

## 2021-07-22 DIAGNOSIS — K219 Gastro-esophageal reflux disease without esophagitis: Secondary | ICD-10-CM | POA: Diagnosis not present

## 2021-07-22 DIAGNOSIS — E781 Pure hyperglyceridemia: Secondary | ICD-10-CM

## 2021-07-22 DIAGNOSIS — Z23 Encounter for immunization: Secondary | ICD-10-CM

## 2021-07-22 DIAGNOSIS — E1169 Type 2 diabetes mellitus with other specified complication: Secondary | ICD-10-CM | POA: Diagnosis not present

## 2021-07-22 DIAGNOSIS — Z9981 Dependence on supplemental oxygen: Secondary | ICD-10-CM

## 2021-07-22 LAB — POCT GLYCOSYLATED HEMOGLOBIN (HGB A1C): Hemoglobin A1C: 5.5 % (ref 4.0–5.6)

## 2021-07-22 MED ORDER — VENLAFAXINE HCL ER 75 MG PO CP24: ORAL_CAPSULE | ORAL | 1 refills | Status: DC

## 2021-07-22 MED ORDER — TORSEMIDE 5 MG PO TABS: 5.0000 mg | ORAL_TABLET | Freq: Every day | ORAL | 0 refills | Status: DC

## 2021-07-23 LAB — LIPID PANEL
Cholesterol: 133 mg/dL (ref <200)
HDL: 35 mg/dL — ABNORMAL LOW (ref >=50)
LDL Cholesterol (Calc): 70 mg/dL (calc)
Non-HDL Cholesterol (Calc): 98 mg/dL (calc) (ref <130)
Total CHOL/HDL Ratio: 3.8 (calc) (ref <5.0)
Triglycerides: 227 mg/dL — ABNORMAL HIGH (ref <150)

## 2021-07-23 LAB — VITAMIN D 25 HYDROXY (VIT D DEFICIENCY, FRACTURES): Vit D, 25-Hydroxy: 57 ng/mL (ref 30–100)

## 2021-07-23 LAB — MICROALBUMIN / CREATININE URINE RATIO
Creatinine, Urine: 113 mg/dL (ref 20–275)
Microalb Creat Ratio: 30 mcg/mg creat — ABNORMAL HIGH (ref <30)
Microalb, Ur: 3.4 mg/dL

## 2021-07-23 LAB — PTH, INTACT AND CALCIUM
Calcium: 10.4 mg/dL (ref 8.6–10.4)
PTH: 74 pg/mL (ref 16–77)

## 2021-07-31 DIAGNOSIS — N200 Calculus of kidney: Secondary | ICD-10-CM | POA: Diagnosis not present

## 2021-07-31 DIAGNOSIS — R1011 Right upper quadrant pain: Secondary | ICD-10-CM | POA: Diagnosis not present

## 2021-07-31 DIAGNOSIS — I10 Essential (primary) hypertension: Secondary | ICD-10-CM | POA: Diagnosis not present

## 2021-07-31 DIAGNOSIS — Z87891 Personal history of nicotine dependence: Secondary | ICD-10-CM | POA: Diagnosis not present

## 2021-07-31 DIAGNOSIS — N132 Hydronephrosis with renal and ureteral calculous obstruction: Secondary | ICD-10-CM | POA: Diagnosis not present

## 2021-07-31 DIAGNOSIS — R1013 Epigastric pain: Secondary | ICD-10-CM | POA: Diagnosis not present

## 2021-07-31 DIAGNOSIS — R7989 Other specified abnormal findings of blood chemistry: Secondary | ICD-10-CM | POA: Diagnosis not present

## 2021-07-31 DIAGNOSIS — R110 Nausea: Secondary | ICD-10-CM | POA: Diagnosis not present

## 2021-08-06 ENCOUNTER — Ambulatory Visit (INDEPENDENT_AMBULATORY_CARE_PROVIDER_SITE_OTHER): Payer: Medicare Other

## 2021-08-06 DIAGNOSIS — Z0000 Encounter for general adult medical examination without abnormal findings: Secondary | ICD-10-CM | POA: Diagnosis not present

## 2021-08-06 NOTE — Progress Notes (Signed)
Subjective:   Judy Allen is a 65 y.o. female who presents for Medicare Annual (Subsequent) preventive examination.  Virtual Visit via Telephone Note  I connected with  [name] on 08/06/21 at 10:40 AM EST by telephone and verified that I am speaking with the correct person using two identifiers.  Location: Patient: home Provider: [facility] Persons participating in the virtual visit: patient/Nurse Health Advisor   I discussed the limitations, risks, security and privacy concerns of performing an evaluation and management service by telephone and the availability of in person appointments. The patient expressed understanding and agreed to proceed.  Interactive audio and video telecommunications were attempted between this nurse and patient, however failed, due to patient having technical difficulties OR patient did not have access to video capability.  We continued and completed visit with audio only.  Some vital signs may be absent or patient reported.   [name], LPN   Review of Systems     Cardiac Risk Factors include: advanced age (>2men, >36 women);diabetes mellitus;dyslipidemia;hypertension;sedentary lifestyle;obesity (BMI >30kg/m2)     Objective:    There were no vitals filed for this visit. There is no height or weight on file to calculate BMI.  Advanced Directives 08/06/2021 11/11/2020 07/31/2020 05/13/2020 08/31/2018 05/16/2018 02/20/2018  Does Patient Have a Medical Advance Directive? Yes Yes Yes No Yes Yes Yes  Type of Estate agent of [place];Living will Healthcare Power of [employer] of [place];Living will - Healthcare Power of [employer] Power of [employer] Power of Attorney  Does patient want to make changes to medical advance directive? - - - - - - -  Copy of Healthcare Power of Attorney in Chart? No - copy requested - No - copy requested - No - copy requested - -  Would patient like information on  creating a medical advance directive? - No - Patient declined - No - Patient declined No - Patient declined - -    Current Medications (verified) Outpatient Encounter Medications as of 08/06/2021  Medication Sig   albuterol (PROVENTIL HFA;VENTOLIN HFA) 108 (90 Base) MCG/ACT inhaler Inhale 2 puffs into the lungs every 6 (six) hours as needed for wheezing or shortness of breath.   ANORO ELLIPTA 62.5-25 MCG/INH AEPB INL 1 PUFF PO D   blood glucose meter kit and supplies Dispense based on patient and insurance preference. Use up to four times daily as directed. (FOR ICD-10 E10.9, E11.9).   CONTOUR NEXT TEST test strip USE UP TO FOUR TIMES DAILY AS DIRECTED   gabapentin (NEURONTIN) 300 MG capsule TAKE 1 CAPSULE(300 MG) BY MOUTH THREE TIMES DAILY   gemfibrozil (LOPID) 600 MG tablet Take 600 mg by mouth 2 (two) times daily.   hydrocortisone 2.5 % cream Apply topically 2 (two) times daily.   Metoprolol Succinate 50 MG CS24 Take 50 mg by mouth daily.   Microlet Lancets MISC USE FOUR TIMES DAILY   omega-3 acid ethyl esters (LOVAZA) 1 g capsule TAKE 2 CAPSULES BY MOUTH TWO TIMES A DAY   omeprazole (PRILOSEC) 40 MG capsule TAKE 1 CAPSULE BY MOUTH  DAILY   OXYGEN Inhale 2 L into the lungs continuous.    telmisartan (MICARDIS) 40 MG tablet Take 1 tablet by mouth daily.   tiZANidine (ZANAFLEX) 4 MG tablet Take 1 tablet (4 mg total) by mouth every 8 (eight) hours as needed.   traMADol (ULTRAM) 50 MG tablet Take 1 tablet (50 mg total) by mouth every 6 (six) hours as needed for  severe pain. Each fill must last 30 days   venlafaxine XR (EFFEXOR-XR) 75 MG 24 hr capsule TAKE 3 CAPSULES BY MOUTH  DAILY WITH BREAKFAST   Vitamin D, Ergocalciferol, (DRISDOL) 1.25 MG (50000 UT) CAPS capsule Take 1 capsule (50,000 Units total) by mouth every 7 (seven) days.   [DISCONTINUED] amLODipine (NORVASC) 2.5 MG tablet Take 1 tablet by mouth daily.   [DISCONTINUED] torsemide (DEMADEX) 5 MG tablet Take 1 tablet (5 mg total) by  mouth daily.   No facility-administered encounter medications on file as of 08/06/2021.    Allergies (verified) Dapsone, Hydrochlorothiazide, Sulfa antibiotics, and Sulfasalazine   History: Past Medical History:  Diagnosis Date   Allergy    Anemia    H/O   Anxiety    Arthritis    Colon cancer ([place]) 2017   anal ca/chemo and rad   Complication of anesthesia    Constipation    COPD (chronic obstructive pulmonary disease) ([place])    also emphysema   Depression    Diabetes mellitus without complication ([place])    Dysrhythmia    TACHYCARDIA-WELL CONTROLLED ON METOPROLO   Emphysema of lung ([facility])    GERD (gastroesophageal reflux disease)    H/O allergic rhinitis    Heart murmur    Hemorrhoid    Hyperlipidemia    Hypertension    Macular degeneration    Neck pain, chronic    Neuritis    Ovarian failure    Oxygen deficiency    Personal history of chemotherapy    Personal history of radiation therapy    PONV (postoperative nausea and vomiting)    Pre-diabetes 2019   just being followed.  no meds   Proteinuria    Psoriasis    Sleep apnea 09/12/2016   8cm H2O with 21pm oxygen mask: Eson size Medium. Heated humidifier for nasal dryness.   Tachycardia, paroxysmal ([place])    Urinary incontinence    Past Surgical History:  Procedure Laterality Date   ANTERIOR FUSION LUMBAR SPINE  1990   plate in back, not metal   CARDIAC CATHETERIZATION  2009?    [facility]   CARDIAC CATHETERIZATION  05/2012   [facility]: Mild hypertension 37/12 with a mean pressure of 21 mm mercury   CHOLECYSTECTOMY N/A 09/12/2017   Procedure: LAPAROSCOPIC CHOLECYSTECTOMY WITH INTRAOPERATIVE CHOLANGIOGRAM;  Surgeon: Robert Bellow, MD;  Location: [facility] ORS;  Service: General;  Laterality: N/A;   COLONOSCOPY  2018   herniated disc repair  2001   LIVER BIOPSY N/A 09/12/2017   Procedure: LIVER BIOPSY;  Surgeon: Robert Bellow, MD;  Location: [facility] OR[facility];  Service: General;  Laterality: N/A;   MASS EXCISION Left  02/23/2016   Procedure: EXCISION MASS;  Surgeon: Christene Lye, MD;  Location: [facility] ORS;  Service: General;  Laterality: Left;   NECK SURGERY     PORT-A-CATH REMOVAL  2018   PORTACATH PLACEMENT Left 03/12/2016   Procedure: INSERTION PORT-A-CATH;  Surgeon: Christene Lye, MD;  Location: [facility] ORS;  Service: General;  Laterality: Left;   SPINE SURGERY     tonsillectomy  1959   history   TRIGGER FINGER RELEASE Right 09/2018   Dr. Sabra Heck    TUBAL LIGATION     s/p   Family History  Problem Relation Age of Onset   Heart attack Mother    Asthma Mother    Liver cancer Father    Cancer Father    Breast cancer Maternal Aunt    Bladder Cancer Neg Hx    Kidney  cancer Neg Hx    Colon cancer Neg Hx    Social History   Socioeconomic History   Marital status: Widowed    Spouse name: Automotive engineer   Number of children: 3   Years of education: Not on file   Highest education level: 11th grade  Occupational History   Not on file  Tobacco Use   Smoking status: Former    Packs/day: 1.00    Years: 32.00    Pack years: 32.00    Types: Cigarettes    Start date: 08/10/1963    Quit date: 01/15/2006    Years since quitting: 15.5   Smokeless tobacco: Never  Vaping Use   Vaping Use: Never used  Substance and Sexual Activity   Alcohol use: No   Drug use: No   Sexual activity: Not Currently    Birth control/protection: Post-menopausal  Other Topics Concern   Not on file  Social History Narrative   Husband passed away on 12-31-2017   She was living with [name] for few years, but moved in with Candace her oldest daughter this past Summer.    Social Determinants of Health   Financial Resource Strain: Low Risk    Difficulty of Paying Living Expenses: Not very hard  Food Insecurity: No Food Insecurity   Worried About Charity fundraiser in the Last Year: Never true   Ran Out of Food in the Last Year: Never true  Transportation Needs: No Transportation Needs   Lack of  Transportation (Medical): No   Lack of Transportation (Non-Medical): No  Physical Activity: Inactive   Days of Exercise per Week: 0 days   Minutes of Exercise per Session: 0 min  Stress: No Stress Concern Present   Feeling of Stress : Not at all  Social Connections: Socially Isolated   Frequency of Communication with Friends and Family: More than three times a week   Frequency of Social Gatherings with Friends and Family: More than three times a week   Attends Religious Services: Never   Marine scientist or Organizations: No   Attends Archivist Meetings: Never   Marital Status: Widowed    Tobacco Counseling Counseling given: Not Answered   Clinical Intake:  Pre-visit preparation completed: Yes  Pain : No/denies pain     Nutritional Risks: None Diabetes: Yes CBG done?: No Did pt. bring in CBG monitor from home?: No  How often do you need to have someone help you when you read instructions, pamphlets, or other written materials from your doctor or pharmacy?: 1 - Never  Nutrition Risk Assessment:  Has the patient had any N/V/D within the last 2 months?  No  Does the patient have any non-healing wounds?  No  Has the patient had any unintentional weight loss or weight gain?  No   Diabetes:  Is the patient diabetic?  Yes  If diabetic, was a CBG obtained today?  No  Did the patient bring in their glucometer from home?  No  How often do you monitor your CBG's? As needed per patient.   Financial Strains and Diabetes Management:  Are you having any financial strains with the device, your supplies or your medication? No .  Does the patient want to be seen by Chronic Care Management for management of their diabetes?  No  Would the patient like to be referred to a Nutritionist or for Diabetic Management?  No   Diabetic Exams:  Diabetic Eye Exam: Completed 09/25/20.  Diabetic Foot Exam: Completed 07/22/21.     Interpreter Needed?: No  Information  entered by :: Clemetine Marker LPN   Activities of Daily Living In your present state of health, do you have any difficulty performing the following activities: 08/06/2021 07/22/2021  Hearing? N N  Vision? Y Y  Difficulty concentrating or making decisions? N N  Walking or climbing stairs? N N  Dressing or bathing? N N  Doing errands, shopping? N N  Preparing Food and eating ? N -  Using the Toilet? N -  In the past six months, have you accidently leaked urine? N -  Do you have problems with loss of bowel control? N -  Managing your Medications? N -  Managing your Finances? N -  Housekeeping or managing your Housekeeping? N -  Some recent data might be hidden    Patient Care Team: Steele Sizer, MD as PCP - General Lucilla Lame, MD as Consulting Physician (Gastroenterology) Carolin Coy, MD as Radiation Oncologist (Radiation Oncology) Gabriel Carina Betsey Holiday, MD as Physician Assistant (Endocrinology) Jimmye Norman, MD (Inactive) as Resident (Pulmonary Disease) Franciso Bend, MD (Cardiology)  Indicate any recent Medical Services you may have received from other than [facility] providers in the past year (date may be approximate).     Assessment:   This is a routine wellness examination for [facility].  Hearing/Vision screen Hearing Screening - Comments:: Pt denies hearing difficulty Vision Screening - Comments:: Annual vision screenings done by Dr. Jeannett Senior at [place] issues and exercise activities discussed: Current Exercise Habits: The patient does not participate in regular exercise at present, Exercise limited by: orthopedic condition(s)   Goals Addressed   None    Depression Screen PHQ 2/9 Scores 08/06/2021 07/22/2021 05/11/2021 01/20/2021 11/11/2020 10/17/2020 07/31/2020  PHQ - 2 Score 0 0 2 6 0 6 2  PHQ- 9 Score 0 $Remov'1 2 8 'LLrShb$ - 10 5  Exception Documentation - - - - - - -    Fall Risk Fall Risk  08/06/2021 07/22/2021 05/11/2021 01/20/2021 11/11/2020  Falls in the past year? 0 0  0 0 0  Number falls in past yr: 0 - 0 0 -  Injury with Fall? 0 - 0 0 -  Risk for fall due to : No Fall Risks - - - -  Follow up Falls prevention discussed Falls prevention discussed - - -    FALL RISK PREVENTION PERTAINING TO THE HOME:  Any stairs in or around the home? Yes  If so, are there any without handrails? No  Home free of loose throw rugs in walkways, pet beds, electrical cords, etc? Yes  Adequate lighting in your home to reduce risk of falls? Yes   ASSISTIVE DEVICES UTILIZED TO PREVENT FALLS:  Life alert? No  Use of a cane, walker or w/c? No  Grab bars in the bathroom? Yes  Shower chair or bench in shower? No  Elevated toilet seat or a handicapped toilet? No   TIMED UP AND GO:  Was the test performed? No . Telephonic visit.   Cognitive Function: Normal cognitive status assessed by direct observation by this Nurse Health Advisor. No abnormalities found.       6CIT Screen 07/31/2020  What Year? 0 points  What month? 0 points  What time? 0 points  Count back from 20 0 points  Months in reverse 0 points  Repeat phrase 2 points  Total Score 2    Immunizations Immunization History  Administered Date(s) Administered  Influenza, High Dose Seasonal PF 05/22/2021   Influenza, Seasonal, Injecte, Preservative Fre 05/09/2012   Influenza,inj,Quad PF,6+ Mos 05/20/2014, 05/20/2016, 04/27/2017, 04/27/2018, 04/23/2019, 05/16/2020   Influenza-Unspecified 05/20/2014, 05/20/2016, 04/27/2017, 06/04/2017, 04/27/2018, 04/13/2019   Moderna Sars-Covid-2 Vaccination 10/31/2019, 11/28/2019, 06/16/2020, 05/22/2021   Pneumococcal Conjugate-13 06/19/2020   Pneumococcal Polysaccharide-23 03/10/2016   Td 03/03/2009   Tdap 03/03/2009, 04/23/2019    TDAP status: Up to date  Flu Vaccine status: Up to date  Pneumococcal vaccine status: Up to date  Covid-19 vaccine status: Completed vaccines  Qualifies for Shingles Vaccine? Yes   Zostavax completed No   Shingrix Completed?: No.     Education has been provided regarding the importance of this vaccine. Patient has been advised to call insurance company to determine out of pocket expense if they have not yet received this vaccine. Advised may also receive vaccine at local pharmacy or Health Dept. Verbalized acceptance and understanding.  Screening Tests Health Maintenance  Topic Date Due   MAMMOGRAM  05/12/2021   Pneumonia Vaccine 39+ Years old (3 - PPSV23 if available, else PCV20) 06/19/2021   COVID-19 Vaccine (5 - Booster for Moderna series) 08/07/2021 (Originally 07/17/2021)   Zoster Vaccines- Shingrix (1 of 2) 10/20/2021 (Originally 03/23/1975)   OPHTHALMOLOGY EXAM  09/25/2021   HEMOGLOBIN A1C  01/20/2022   FOOT EXAM  07/22/2022   COLONOSCOPY (Pts 45-54yrs Insurance coverage will need to be confirmed)  03/17/2026   TETANUS/TDAP  04/22/2029   INFLUENZA VACCINE  Completed   DEXA SCAN  Completed   Hepatitis C Screening  Completed   HPV VACCINES  Aged Out    Health Maintenance  Health Maintenance Due  Topic Date Due   MAMMOGRAM  05/12/2021   Pneumonia Vaccine 18+ Years old (3 - PPSV23 if available, else PCV20) 06/19/2021    Colorectal cancer screening: Type of screening: Colonoscopy. Completed 03/17/16. Repeat every 10 years  Mammogram status: Completed 05/12/20. Repeat every year. Ordered 07/22/21  Bone Density status: Completed 05/06/20. Results reflect: Bone density results: OSTEOPENIA. Repeat every 2 years.  Lung Cancer Screening: (Low Dose CT Chest recommended if Age 68-80 years, 30 pack-year currently smoking OR have quit w/in 15years.) does not qualify.   Additional Screening:  Hepatitis C Screening: does qualify; Completed 12/12/19  Vision Screening: Recommended annual ophthalmology exams for early detection of glaucoma and other disorders of the eye. Is the patient up to date with their annual eye exam?  Yes  Who is the provider or what is the name of the office in which the patient attends annual eye  exams? Dr. Royetta Crochet  Dental Screening: Recommended annual dental exams for proper oral hygiene  Community Resource Referral / Chronic Care Management: CRR required this visit?  No   CCM required this visit?  No      Plan:     I have personally reviewed and noted the following in the patients chart:   Medical and social history Use of alcohol, tobacco or illicit drugs  Current medications and supplements including opioid prescriptions.  Functional ability and status Nutritional status Physical activity Advanced directives List of other physicians Hospitalizations, surgeries, and ER visits in previous 12 months Vitals Screenings to include cognitive, depression, and falls Referrals and appointments  In addition, I have reviewed and discussed with patient certain preventive protocols, quality metrics, and best practice recommendations. A written personalized care plan for preventive services as well as general preventive health recommendations were provided to patient.     [name], LPN   [id number]  Nurse Notes: none

## 2021-08-06 NOTE — Patient Instructions (Signed)
Judy Allen , Thank you for taking time to come for your Medicare Wellness Visit. I appreciate your ongoing commitment to your health goals. Please review the following plan we discussed and let me know if I can assist you in the future.   Screening recommendations/referrals: Colonoscopy: done 03/17/16. Repeat 03/2026 Mammogram: done 05/12/20. Please call to schedule your mammogram.  Bone Density: done 05/06/20 Recommended yearly ophthalmology/optometry visit for glaucoma screening and checkup Recommended yearly dental visit for hygiene and checkup  Vaccinations: Influenza vaccine: done 05/22/21 Pneumococcal vaccine: done 06/19/20 Tdap vaccine: done 04/23/19 Shingles vaccine: Shingrix discussed. Please contact your pharmacy for coverage information.  Covid-19: done 10/31/19, 11/28/19, 06/16/20 & 05/22/21  Advanced directives: Please bring a copy of your health care power of attorney and living will to the office at your convenience.   Conditions/risks identified: Recommend increasing physical activity as tolerated  Next appointment: Follow up in one year for your annual wellness visit    Preventive Care 65 Years and Older, Female Preventive care refers to lifestyle choices and visits with your health care provider that can promote health and wellness. What does preventive care include? A yearly physical exam. This is also called an annual well check. Dental exams once or twice a year. Routine eye exams. Ask your health care provider how often you should have your eyes checked. Personal lifestyle choices, including: Daily care of your teeth and gums. Regular physical activity. Eating a healthy diet. Avoiding tobacco and drug use. Limiting alcohol use. Practicing safe sex. Taking low-dose aspirin every day. Taking vitamin and mineral supplements as recommended by your health care provider. What happens during an annual well check? The services and screenings done by your health care  provider during your annual well check will depend on your age, overall health, lifestyle risk factors, and family history of disease. Counseling  Your health care provider may ask you questions about your: Alcohol use. Tobacco use. Drug use. Emotional well-being. Home and relationship well-being. Sexual activity. Eating habits. History of falls. Memory and ability to understand (cognition). Work and work Statistician. Reproductive health. Screening  You may have the following tests or measurements: Height, weight, and BMI. Blood pressure. Lipid and cholesterol levels. These may be checked every 5 years, or more frequently if you are over 35 years old. Skin check. Lung cancer screening. You may have this screening every year starting at age 9 if you have a 30-pack-year history of smoking and currently smoke or have quit within the past 15 years. Fecal occult blood test (FOBT) of the stool. You may have this test every year starting at age 76. Flexible sigmoidoscopy or colonoscopy. You may have a sigmoidoscopy every 5 years or a colonoscopy every 10 years starting at age 63. Hepatitis C blood test. Hepatitis B blood test. Sexually transmitted disease (STD) testing. Diabetes screening. This is done by checking your blood sugar (glucose) after you have not eaten for a while (fasting). You may have this done every 1-3 years. Bone density scan. This is done to screen for osteoporosis. You may have this done starting at age 12. Mammogram. This may be done every 1-2 years. Talk to your health care provider about how often you should have regular mammograms. Talk with your health care provider about your test results, treatment options, and if necessary, the need for more tests. Vaccines  Your health care provider may recommend certain vaccines, such as: Influenza vaccine. This is recommended every year. Tetanus, diphtheria, and acellular pertussis (Tdap, Td) vaccine.  You may need a Td  booster every 10 years. Zoster vaccine. You may need this after age 50. Pneumococcal 13-valent conjugate (PCV13) vaccine. One dose is recommended after age 60. Pneumococcal polysaccharide (PPSV23) vaccine. One dose is recommended after age 34. Talk to your health care provider about which screenings and vaccines you need and how often you need them. This information is not intended to replace advice given to you by your health care provider. Make sure you discuss any questions you have with your health care provider. Document Released: 08/22/2015 Document Revised: 04/14/2016 Document Reviewed: 05/27/2015 Elsevier Interactive Patient Education  2017 [place] Prevention in the Home Falls can cause injuries. They can happen to people of all ages. There are many things you can do to make your home safe and to help prevent falls. What can I do on the outside of my home? Regularly fix the edges of walkways and driveways and fix any cracks. Remove anything that might make you trip as you walk through a door, such as a raised step or threshold. Trim any bushes or trees on the path to your home. Use bright outdoor lighting. Clear any walking paths of anything that might make someone trip, such as rocks or tools. Regularly check to see if handrails are loose or broken. Make sure that both sides of any steps have handrails. Any raised decks and porches should have guardrails on the edges. Have any leaves, snow, or ice cleared regularly. Use sand or salt on walking paths during winter. Clean up any spills in your garage right away. This includes oil or grease spills. What can I do in the bathroom? Use night lights. Install grab bars by the toilet and in the tub and shower. Do not use towel bars as grab bars. Use non-skid mats or decals in the tub or shower. If you need to sit down in the shower, use a plastic, non-slip stool. Keep the floor dry. Clean up any water that spills on the floor  as soon as it happens. Remove soap buildup in the tub or shower regularly. Attach bath mats securely with double-sided non-slip rug tape. Do not have throw rugs and other things on the floor that can make you trip. What can I do in the bedroom? Use night lights. Make sure that you have a light by your bed that is easy to reach. Do not use any sheets or blankets that are too big for your bed. They should not hang down onto the floor. Have a firm chair that has side arms. You can use this for support while you get dressed. Do not have throw rugs and other things on the floor that can make you trip. What can I do in the kitchen? Clean up any spills right away. Avoid walking on wet floors. Keep items that you use a lot in easy-to-reach places. If you need to reach something above you, use a strong step stool that has a grab bar. Keep electrical cords out of the way. Do not use floor polish or wax that makes floors slippery. If you must use wax, use non-skid floor wax. Do not have throw rugs and other things on the floor that can make you trip. What can I do with my stairs? Do not leave any items on the stairs. Make sure that there are handrails on both sides of the stairs and use them. Fix handrails that are broken or loose. Make sure that handrails are as long as  the stairways. Check any carpeting to make sure that it is firmly attached to the stairs. Fix any carpet that is loose or worn. Avoid having throw rugs at the top or bottom of the stairs. If you do have throw rugs, attach them to the floor with carpet tape. Make sure that you have a light switch at the top of the stairs and the bottom of the stairs. If you do not have them, ask someone to add them for you. What else can I do to help prevent falls? Wear shoes that: Do not have high heels. Have rubber bottoms. Are comfortable and fit you well. Are closed at the toe. Do not wear sandals. If you use a stepladder: Make sure that it is  fully opened. Do not climb a closed stepladder. Make sure that both sides of the stepladder are locked into place. Ask someone to hold it for you, if possible. Clearly mark and make sure that you can see: Any grab bars or handrails. First and last steps. Where the edge of each step is. Use tools that help you move around (mobility aids) if they are needed. These include: Canes. Walkers. Scooters. Crutches. Turn on the lights when you go into a dark area. Replace any light bulbs as soon as they burn out. Set up your furniture so you have a clear path. Avoid moving your furniture around. If any of your floors are uneven, fix them. If there are any pets around you, be aware of where they are. Review your medicines with your doctor. Some medicines can make you feel dizzy. This can increase your chance of falling. Ask your doctor what other things that you can do to help prevent falls. This information is not intended to replace advice given to you by your health care provider. Make sure you discuss any questions you have with your health care provider. Document Released: 05/22/2009 Document Revised: 01/01/2016 Document Reviewed: 08/30/2014 Elsevier Interactive Patient Education  2017 [employer].

## 2021-08-09 DIAGNOSIS — R0902 Hypoxemia: Secondary | ICD-10-CM | POA: Diagnosis not present

## 2021-08-09 DIAGNOSIS — I270 Primary pulmonary hypertension: Secondary | ICD-10-CM | POA: Diagnosis not present

## 2021-08-14 DIAGNOSIS — N200 Calculus of kidney: Secondary | ICD-10-CM | POA: Diagnosis not present

## 2021-08-14 DIAGNOSIS — E213 Hyperparathyroidism, unspecified: Secondary | ICD-10-CM | POA: Diagnosis not present

## 2021-08-14 DIAGNOSIS — Z6832 Body mass index (BMI) 32.0-32.9, adult: Secondary | ICD-10-CM | POA: Diagnosis not present

## 2021-08-14 DIAGNOSIS — I10 Essential (primary) hypertension: Secondary | ICD-10-CM | POA: Diagnosis not present

## 2021-08-22 IMAGING — MR MR LUMBAR SPINE W/O CM
4 of 5 series · 33 of 48 positions shown · non-contrast
Comparison: 02/28/2015 MRI lumbar spine.

CLINICAL DATA: lumbosacral Low back pain, > 6 wks Lumbar
radiculopathy

EXAM:
MRI LUMBAR SPINE WITHOUT CONTRAST
TECHNIQUE: Multiplanar, multisequence MR imaging of the lumbar spine was
performed. No intravenous contrast was administered.

[Series 5: T2 · sagittal · 4.0mm · 0.81mm/px · 8 of 19 slices shown (1 of 2)]
[im 1/19]
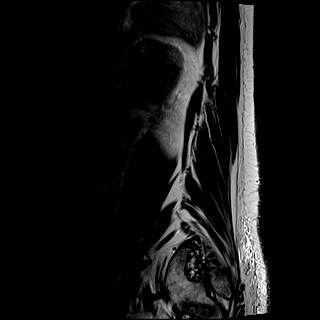
[im 3/19]
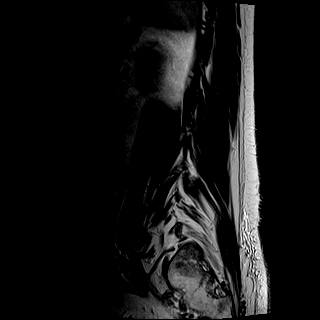
[im 6/19]
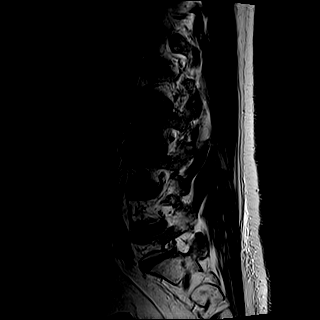
[im 8/19]
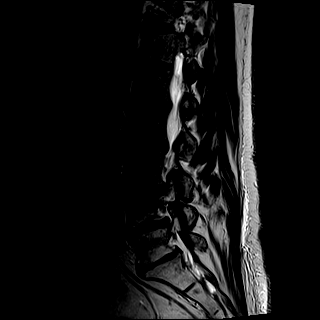
[im 11/19]
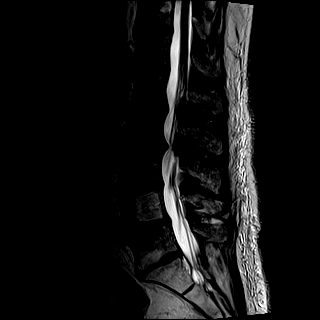
[im 13/19]
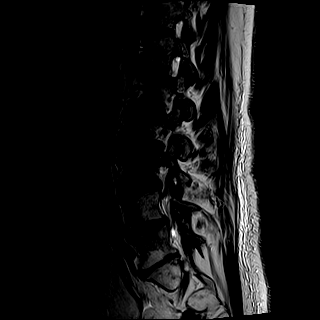
[im 16/19]
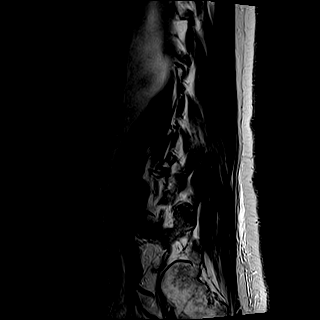
[im 19/19]
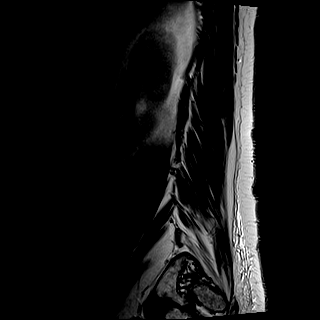

[Series 6: T1 · sagittal · 4.0mm · 0.81mm/px · 7 of 19 slices shown (1 of 2)]
[im 1/19]
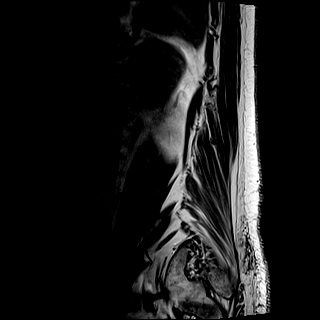
[im 4/19]
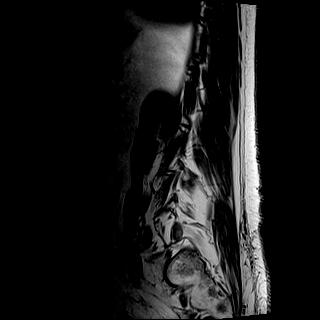
[im 7/19]
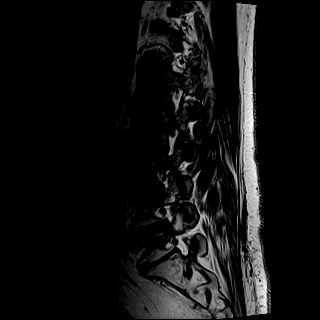
[im 10/19]
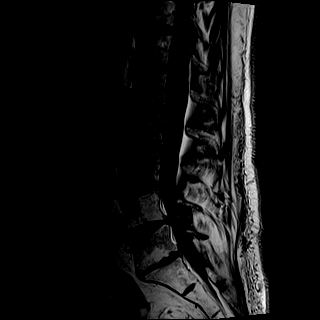
[im 13/19]
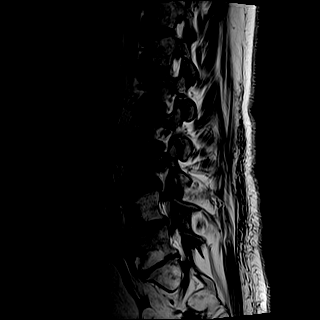
[im 16/19]
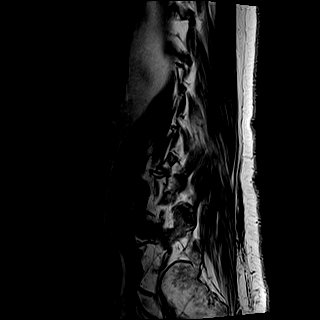
[im 19/19]
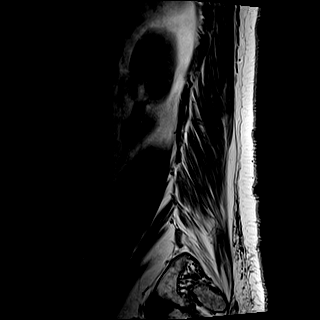

[Series 8: T2 · axial · 4.0mm · 0.78mm/px · z∈[-90,+108]mm · 9 of 35 slices shown (2 of 2)]
[im 1/35]
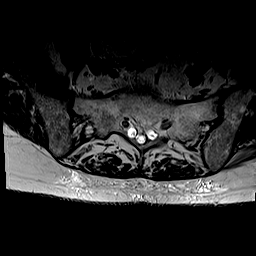
[im 6/35]
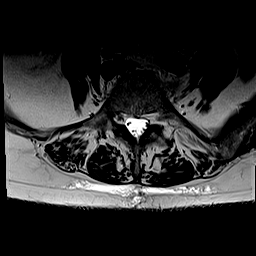
[im 12/35]
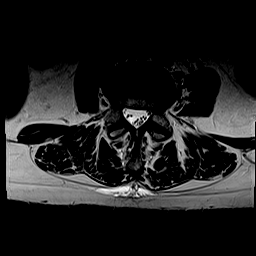
[im 15/35]
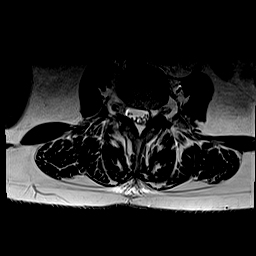
[im 18/35]
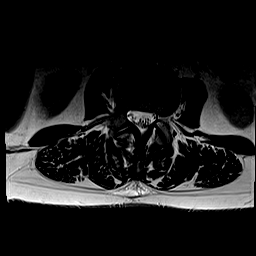
[im 20/35]
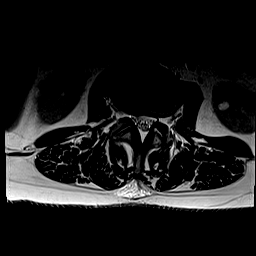
[im 23/35]
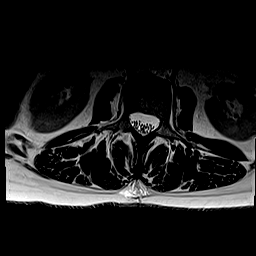
[im 29/35]
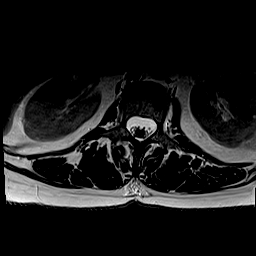
[im 35/35]
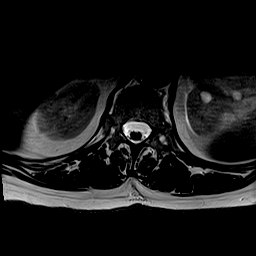

[Series 9: T1 · axial · 4.0mm · 0.39mm/px · z∈[-90,+108]mm · 9 of 35 slices shown (2 of 2)]
[im 1/35]
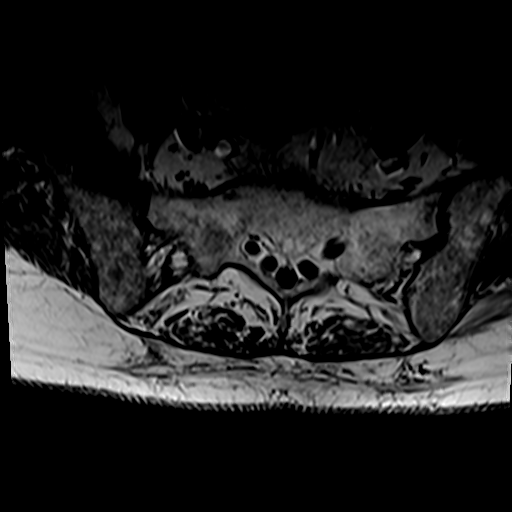
[im 6/35]
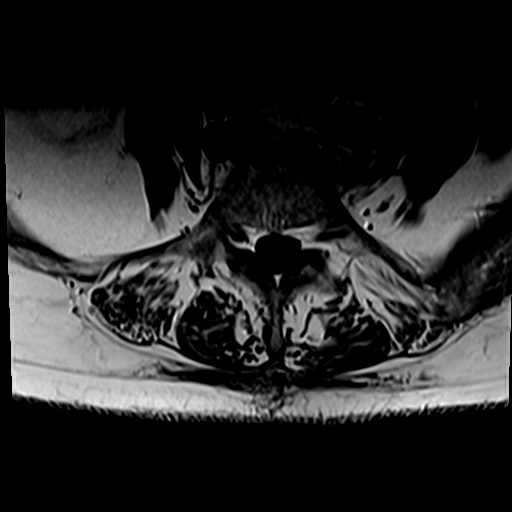
[im 12/35]
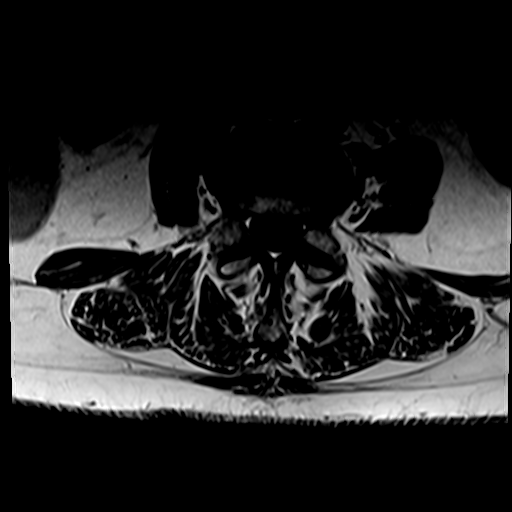
[im 15/35]
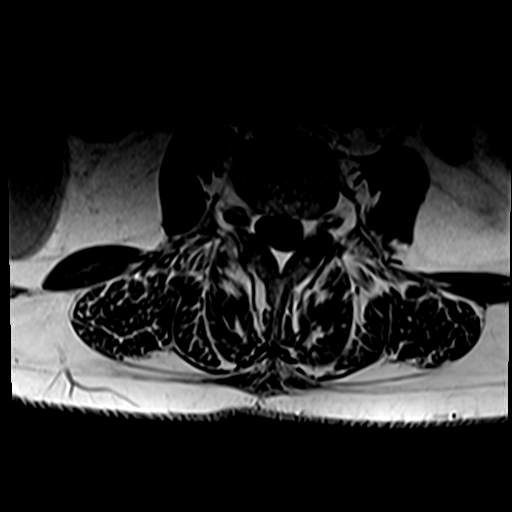
[im 18/35]
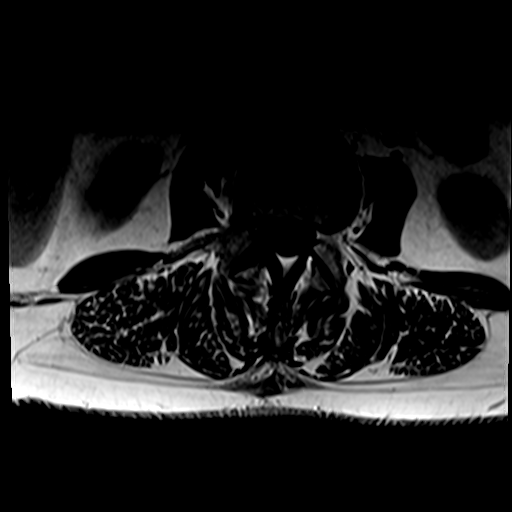
[im 20/35]
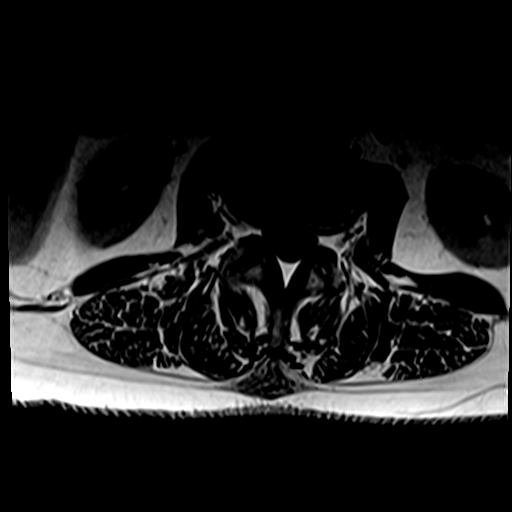
[im 23/35]
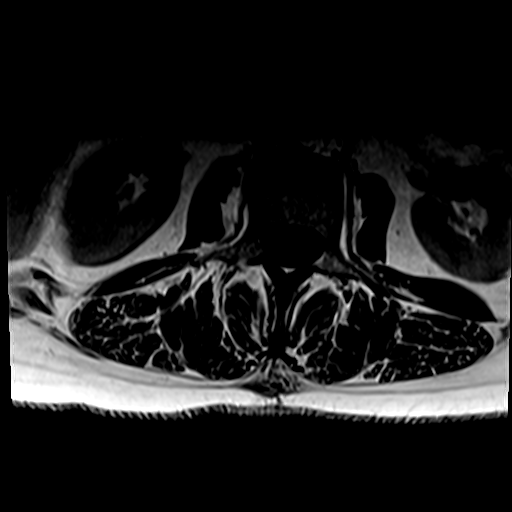
[im 29/35]
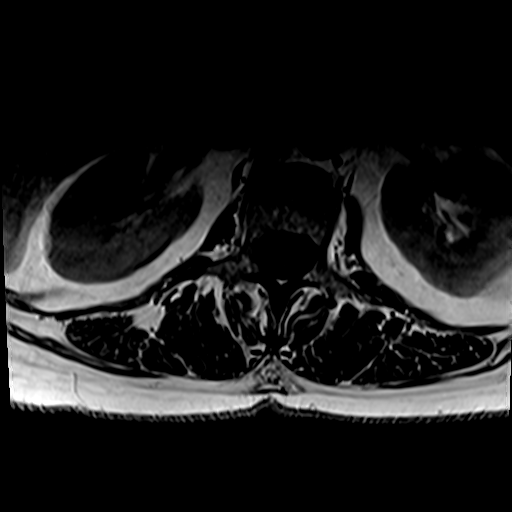
[im 35/35]
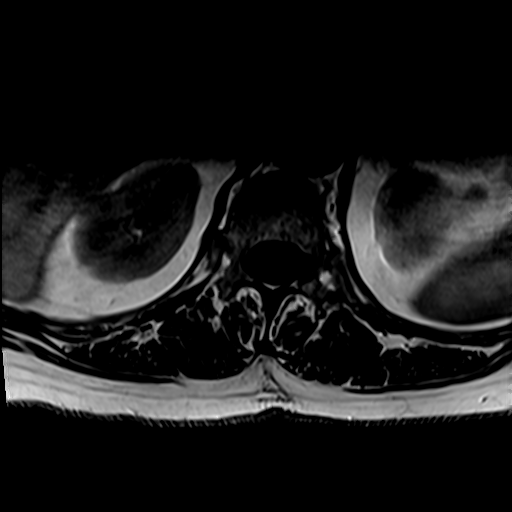

[33 of 48 positions shown; findings below may reference images not displayed]

FINDINGS: Segmentation:  Standard.

Alignment:  Normal alignment.

Vertebrae: Mild bone marrow heterogeneity with multilevel Modic type
2 endplate degenerative changes and osteophytosis. L4 hemangioma.
Vertebral body heights are preserved. No fracture or evidence of
discitis. No pars defect.

Conus medullaris and cauda equina: Conus extends to the L2 level.
Conus and cauda equina appear normal.

Disc levels: Multilevel desiccation and disc space loss most
prominent at the L5-S1 level.

T12-L1: No significant disc bulge. Bilateral facet degenerative
spurring. Patent spinal canal and neural foramen.

L1-2: Mild disc bulge with shallow superimposed left foraminal
protrusion. Prominent ligamentum flavum and bilateral facet
hypertrophy. Partial effacement of the ventral CSF containing
spaces. Patent spinal canal and neural foramen.

L2-3: Disc bulge with shallow central protrusion, prominent
ligamentum flavum and bilateral facet degenerative spurring. Patent
left neural foramen. Mild right neural foraminal narrowing. Mild
spinal canal narrowing with thecal sac measuring 10.4 mm in AP
dimension.

L3-4: Disc bulge with shallow superimposed right foraminal
protrusion. Ligamentum flavum thickening and bilateral facet
hypertrophy. Partial effacement of the lateral recesses. Patent
spinal canal with the thecal sac measuring 13.3 mm in AP dimension.
Mild right and moderate left neural foraminal narrowing.

L4-5: Disc bulge with shallow superimposed central and right
foraminal protrusions. Prominent ligamentum flavum and bilateral
facet hypertrophy. Mild spinal canal narrowing with the thecal sac
measuring 11 mm in AP dimension. Mild left greater than right neural
foraminal narrowing.

L5-S1: Minimal disc bulge with shallow central protrusion. Sequela
of left laminotomy. There is no significant fibrosis/scarring.
Patent spinal canal and bilateral neural foramen.

Paraspinal and other soft tissues: Duplicated IVC.
IMPRESSION: Multilevel spondylosis, grossly unchanged since prior exam.

Moderate left L3-4 neural foraminal narrowing. Mild L4-5 spinal
canal narrowing.

Mild right L2-4 and bilateral L4-5 neural foraminal narrowing.

Sequela of prior left laminotomy at the L5-S1 level. No significant
fibrosis or scarring.

## 2021-08-26 ENCOUNTER — Other Ambulatory Visit: Payer: Self-pay

## 2021-08-26 ENCOUNTER — Encounter: Payer: Self-pay | Admitting: Family Medicine

## 2021-08-26 DIAGNOSIS — K219 Gastro-esophageal reflux disease without esophagitis: Secondary | ICD-10-CM

## 2021-08-26 DIAGNOSIS — F411 Generalized anxiety disorder: Secondary | ICD-10-CM

## 2021-08-26 DIAGNOSIS — F339 Major depressive disorder, recurrent, unspecified: Secondary | ICD-10-CM

## 2021-08-26 DIAGNOSIS — E781 Pure hyperglyceridemia: Secondary | ICD-10-CM

## 2021-08-26 MED ORDER — VENLAFAXINE HCL ER 75 MG PO CP24: ORAL_CAPSULE | ORAL | 0 refills | Status: DC

## 2021-08-26 MED ORDER — OMEGA-3-ACID ETHYL ESTERS 1 G PO CAPS: ORAL_CAPSULE | ORAL | 0 refills | Status: DC

## 2021-08-26 MED ORDER — OMEPRAZOLE 40 MG PO CPDR: 40.0000 mg | DELAYED_RELEASE_CAPSULE | Freq: Every day | ORAL | 0 refills | Status: DC

## 2021-08-26 NOTE — Telephone Encounter (Signed)
Called to verify patient's preferred pharmacy for maintenance medications. LVM

## 2021-09-01 ENCOUNTER — Other Ambulatory Visit: Payer: Self-pay

## 2021-09-01 DIAGNOSIS — F339 Major depressive disorder, recurrent, unspecified: Secondary | ICD-10-CM

## 2021-09-01 DIAGNOSIS — F411 Generalized anxiety disorder: Secondary | ICD-10-CM

## 2021-09-01 DIAGNOSIS — L409 Psoriasis, unspecified: Secondary | ICD-10-CM

## 2021-09-01 DIAGNOSIS — K219 Gastro-esophageal reflux disease without esophagitis: Secondary | ICD-10-CM

## 2021-09-01 MED ORDER — HYDROCORTISONE 2.5 % EX CREA: TOPICAL_CREAM | Freq: Two times a day (BID) | CUTANEOUS | 0 refills | Status: DC

## 2021-09-02 ENCOUNTER — Encounter: Payer: Self-pay | Admitting: Student in an Organized Health Care Education/Training Program

## 2021-09-04 ENCOUNTER — Other Ambulatory Visit: Payer: Self-pay | Admitting: Family Medicine

## 2021-09-04 ENCOUNTER — Encounter: Payer: Self-pay | Admitting: Family Medicine

## 2021-09-04 DIAGNOSIS — I10 Essential (primary) hypertension: Secondary | ICD-10-CM | POA: Diagnosis not present

## 2021-09-04 DIAGNOSIS — E213 Hyperparathyroidism, unspecified: Secondary | ICD-10-CM | POA: Diagnosis not present

## 2021-09-09 DIAGNOSIS — I270 Primary pulmonary hypertension: Secondary | ICD-10-CM | POA: Diagnosis not present

## 2021-09-09 DIAGNOSIS — R0902 Hypoxemia: Secondary | ICD-10-CM | POA: Diagnosis not present

## 2021-09-11 DIAGNOSIS — E213 Hyperparathyroidism, unspecified: Secondary | ICD-10-CM | POA: Diagnosis not present

## 2021-09-11 DIAGNOSIS — I10 Essential (primary) hypertension: Secondary | ICD-10-CM | POA: Diagnosis not present

## 2021-09-22 DIAGNOSIS — H353133 Nonexudative age-related macular degeneration, bilateral, advanced atrophic without subfoveal involvement: Secondary | ICD-10-CM | POA: Diagnosis not present

## 2021-09-22 DIAGNOSIS — H2513 Age-related nuclear cataract, bilateral: Secondary | ICD-10-CM | POA: Diagnosis not present

## 2021-09-28 ENCOUNTER — Other Ambulatory Visit: Payer: Self-pay | Admitting: Family Medicine

## 2021-09-28 ENCOUNTER — Encounter: Payer: Self-pay | Admitting: Family Medicine

## 2021-09-28 ENCOUNTER — Other Ambulatory Visit: Payer: Self-pay

## 2021-09-28 DIAGNOSIS — F411 Generalized anxiety disorder: Secondary | ICD-10-CM

## 2021-09-28 DIAGNOSIS — F339 Major depressive disorder, recurrent, unspecified: Secondary | ICD-10-CM

## 2021-09-28 MED ORDER — VENLAFAXINE HCL ER 75 MG PO CP24: ORAL_CAPSULE | ORAL | 0 refills | Status: DC

## 2021-10-05 ENCOUNTER — Other Ambulatory Visit: Payer: Self-pay | Admitting: Family Medicine

## 2021-10-05 ENCOUNTER — Telehealth: Payer: Self-pay

## 2021-10-05 ENCOUNTER — Other Ambulatory Visit: Payer: Self-pay

## 2021-10-05 DIAGNOSIS — F411 Generalized anxiety disorder: Secondary | ICD-10-CM

## 2021-10-05 DIAGNOSIS — F339 Major depressive disorder, recurrent, unspecified: Secondary | ICD-10-CM

## 2021-10-05 MED ORDER — EFFEXOR XR 75 MG PO CP24: 225.0000 mg | ORAL_CAPSULE | Freq: Every day | ORAL | 1 refills | Status: DC

## 2021-10-05 NOTE — Telephone Encounter (Signed)
Copied from [place] [id number]. Topic: General - Other >> Oct 05, 2021 10:45 AM Tessa Lerner A wrote: Reason for CRM: The patient's pharmacy has called to share that a prior authorization is required for patient's venlafaxine XR (EFFEXOR-XR) 75 MG 24 hr capsule [[id number]]   The patient must be prescribed Effexor-XR (brand name medication only) or have a BAW1 Completed for this medication  Please contact at:  [phone] option 2   Order # [id number]  Please contact further when possible

## 2021-10-06 ENCOUNTER — Encounter: Payer: Self-pay | Admitting: Family Medicine

## 2021-10-07 DIAGNOSIS — I270 Primary pulmonary hypertension: Secondary | ICD-10-CM | POA: Diagnosis not present

## 2021-10-07 DIAGNOSIS — R0902 Hypoxemia: Secondary | ICD-10-CM | POA: Diagnosis not present

## 2021-10-20 ENCOUNTER — Encounter: Payer: Medicare HMO | Admitting: Student in an Organized Health Care Education/Training Program

## 2021-10-29 ENCOUNTER — Encounter: Payer: Medicare HMO | Admitting: Student in an Organized Health Care Education/Training Program

## 2021-11-03 ENCOUNTER — Encounter: Payer: Self-pay | Admitting: Student in an Organized Health Care Education/Training Program

## 2021-11-03 ENCOUNTER — Other Ambulatory Visit: Payer: Self-pay

## 2021-11-03 ENCOUNTER — Ambulatory Visit
Payer: Medicare HMO | Attending: Student in an Organized Health Care Education/Training Program | Admitting: Student in an Organized Health Care Education/Training Program

## 2021-11-03 VITALS — BP 153/91 | HR 80 | Temp 97.3°F | Resp 18 | Ht 64.0 in | Wt 180.0 lb

## 2021-11-03 DIAGNOSIS — M25551 Pain in right hip: Secondary | ICD-10-CM | POA: Insufficient documentation

## 2021-11-03 DIAGNOSIS — G894 Chronic pain syndrome: Secondary | ICD-10-CM | POA: Diagnosis not present

## 2021-11-03 DIAGNOSIS — G8929 Other chronic pain: Secondary | ICD-10-CM | POA: Insufficient documentation

## 2021-11-03 DIAGNOSIS — M542 Cervicalgia: Secondary | ICD-10-CM | POA: Diagnosis not present

## 2021-11-03 DIAGNOSIS — M7062 Trochanteric bursitis, left hip: Secondary | ICD-10-CM | POA: Diagnosis not present

## 2021-11-03 DIAGNOSIS — M7061 Trochanteric bursitis, right hip: Secondary | ICD-10-CM | POA: Insufficient documentation

## 2021-11-03 DIAGNOSIS — M25552 Pain in left hip: Secondary | ICD-10-CM | POA: Insufficient documentation

## 2021-11-03 MED ORDER — GABAPENTIN 300 MG PO CAPS: ORAL_CAPSULE | ORAL | 2 refills | Status: DC

## 2021-11-03 MED ORDER — TRAMADOL HCL 50 MG PO TABS: 50.0000 mg | ORAL_TABLET | Freq: Four times a day (QID) | ORAL | 5 refills | Status: DC | PRN

## 2021-11-03 MED ORDER — TIZANIDINE HCL 4 MG PO TABS: 4.0000 mg | ORAL_TABLET | Freq: Three times a day (TID) | ORAL | 2 refills | Status: DC | PRN

## 2021-11-03 NOTE — Progress Notes (Signed)
Nursing Pain Medication Assessment:  ?Safety precautions to be maintained throughout the outpatient stay will include: orient to surroundings, keep bed in low position, maintain call bell within reach at all times, provide assistance with transfer out of bed and ambulation.  ?Medication Inspection Compliance: Pill count conducted under aseptic conditions, in front of the patient. Neither the pills nor the bottle was removed from the patient's sight at any time. Once count was completed pills were immediately returned to the patient in their original bottle. ? ?Medication: Tramadol (Ultram) ?Pill/Patch Count:  75 of 120 pills remain ?Pill/Patch Appearance: Markings consistent with prescribed medication ?Bottle Appearance: Standard pharmacy container. Clearly labeled. ?Filled Date: 03 / 14 / 2023 ?Last Medication intake:  Today ?

## 2021-11-03 NOTE — Progress Notes (Signed)
PROVIDER NOTE: Information contained herein reflects review and annotations entered in association with encounter. Interpretation of such information and data should be left to medically-trained personnel. Information provided to patient can be located elsewhere in the medical record under "Patient Instructions". Document created using STT-dictation technology, any transcriptional errors that may result from process are unintentional.  ?  ?Patient: Judy Allen  Service Category: E/M  Provider: Gillis Santa, Allen  ?DOB: [birth year]  DOS: 11/03/2021  Specialty: Interventional Pain Management  ?MRN: [id number]  Setting: Ambulatory outpatient  PCP: Judy Allen  ?Type: Established Patient    Referring Provider: Steele Sizer, Allen  ?Location: Office  Delivery: Face-to-face    ? ?HPI  ?[name], a 66 y.o. year old female, is here today because of her Trochanteric bursitis of both hips [M70.61, M70.62]. Judy Allen primary complain today is Neck Pain and Hip Pain (bilateral) ? ?Pertinent problems: Judy Allen has Chronic recurrent major depressive disorder ([place]); GAD (generalized anxiety disorder); Myofascial pain syndrome, cervical; Chronic pain syndrome; Cervical facet joint syndrome; Cervical radiculopathy (left); and Osteoarthritis of spine with radiculopathy, cervical region on their pertinent problem list. ? ?Last clinic visit: 9/2/122 ? ?Pain Assessment: Severity of Chronic pain is reported as a 5 /10. Location: Neck  /denies. Onset: More than a month ago. Quality: Burning, Sharp. Timing: Intermittent. Modifying factor(s): nothing. ?Vitals:  height is 5' 4"  (1.626 m) and weight is 180 lb (81.6 kg). Her temperature is 97.3 ?F (36.3 ?C) (abnormal). Her blood pressure is 153/91 (abnormal) and her pulse is 80. Her respiration is 18 and oxygen saturation is 90%.  ? ?Reason for encounter: medication management.  She is also complaining of bilateral hip pain related to trochanteric bursitis. ? ?-Refill of  tramadol, tizanidine, gabapentin as below, no change in dose ?-Increased bilateral hip pain related to hip osteoarthritis, trochanteric bursitis.  Offered patient bilateral hip trochanteric bursa injections under fluoroscopy.  Risks and benefits reviewed and patient would like to proceed.  We will do this without sedation ?-Renew urine toxicology screen ? ?Pharmacotherapy Assessment  ? ?Analgesic: Tramadol 50 mg 4 times daily as needed, quantity 120/month; MME equals 20   ? ?Monitoring: ?Judy Allen PMP: Judy Allen reviewed during this encounter.       ?Pharmacotherapy: No side-effects or adverse reactions reported. ?Compliance: No problems identified. ?Effectiveness: Clinically acceptable. ? ?Judy Shorter, RN  11/03/2021 12:48 PM  Sign when Signing Visit ?Nursing Pain Medication Assessment:  ?Safety precautions to be maintained throughout the outpatient stay will include: orient to surroundings, keep bed in low position, maintain call bell within reach at all times, provide assistance with transfer out of bed and ambulation.  ?Medication Inspection Compliance: Pill count conducted under aseptic conditions, in front of the patient. Neither the pills nor the bottle was removed from the patient's sight at any time. Once count was completed pills were immediately returned to the patient in their original bottle. ? ?Medication: Tramadol (Ultram) ?Pill/Patch Count:  75 of 120 pills remain ?Pill/Patch Appearance: Markings consistent with prescribed medication ?Bottle Appearance: Standard pharmacy container. Clearly labeled. ?Filled Date: 03 / 14 / 2023 ?Last Medication intake:  Today ?  ?  UDS:  ?Summary  ?Date Value Ref Range Status  ?05/13/2020 Note  Final  ?  Comment:  ?  ==================================================================== ?ToxAS[employer] Select 13 (MW) ?==================================================================== ?Test                             Result  Flag       Units ? ?Drug Present ?  Tramadol                        >4762                   ng/mg creat ?  O-Desmethyltramadol            >4762                   ng/mg creat ?  N-Desmethyltramadol            2534                    ng/mg creat ?   Source of tramadol is a prescription medication. O-desmethyltramadol ?   and N-desmethyltramadol are expected metabolites of tramadol. ? ?==================================================================== ?Test                      Result    Flag   Units      Ref Range ?  Creatinine              105              mg/dL      >=20 ?==================================================================== ?Declared Medications: ? Medication list was not provided. ?==================================================================== ?For clinical consultation, please call [phone]. ?==================================================================== ?  ?  ? ?ROS  ?Constitutional: Denies any fever or chills ?Gastrointestinal: No reported hemesis, hematochezia, vomiting, or acute GI distress ?Musculoskeletal: Bilateral hip pain tender to palpation overlying trochanteric bursa ?Neurological: No reported episodes of acute onset apraxia, aphasia, dysarthria, agnosia, amnesia, paralysis, loss of coordination, or loss of consciousness ? ?Medication Review  ?COVID-19 mRNA bivalent vaccine (Moderna), Influenza Vac High-Dose Quad, Microlet Lancets, Oxygen-Helium, Vitamin D (Ergocalciferol), albuterol, blood glucose meter kit and supplies, gabapentin, gemfibrozil, glucose blood, hydrocortisone, metoprolol succinate, omega-3 acid ethyl esters, omeprazole, telmisartan, tiZANidine, torsemide, traMADol, umeclidinium-vilanterol, and venlafaxine XR ? ?History Review  ?Allergy: Judy Allen is allergic to dapsone, hydrochlorothiazide, sulfa antibiotics, and sulfasalazine. ?Drug: Judy Allen  reports no history of drug use. ?Alcohol:  reports no history of alcohol use. ?Tobacco:  reports that she quit smoking about 15 years ago. Her smoking use included  cigarettes. She started smoking about 58 years ago. She has a 32.00 pack-year smoking history. She has never used smokeless tobacco. ?Social: Ms. Bisaillon  reports that she quit smoking about 15 years ago. Her smoking use included cigarettes. She started smoking about 58 years ago. She has a 32.00 pack-year smoking history. She has never used smokeless tobacco. She reports that she does not drink alcohol and does not use drugs. ?Medical:  has a past medical history of Allergy, Anemia, Anxiety, Arthritis, Colon cancer ([place]) (9937), Complication of anesthesia, Constipation, COPD (chronic obstructive pulmonary disease) ([place]), Depression, Diabetes mellitus without complication ([place]), Dysrhythmia, Emphysema of lung ([place]), GERD (gastroesophageal reflux disease), H/O allergic rhinitis, Heart murmur, Hemorrhoid, Hyperlipidemia, Hypertension, Macular degeneration, Neck pain, chronic, Neuritis, Ovarian failure, Oxygen deficiency, Personal history of chemotherapy, Personal history of radiation therapy, PONV (postoperative nausea and vomiting), Pre-diabetes (2019), Proteinuria, Psoriasis, Sleep apnea (09/12/2016), Tachycardia, paroxysmal ([place]), and Urinary incontinence. ?Surgical: Ms. Faron  has a past surgical history that includes Tubal ligation; tonsillectomy (1959); herniated disc repair (2001); Anterior fusion lumbar spine (1990); Neck surgery; Mass excision (Left, 02/23/2016); Colonoscopy (2018); Portacath placement (Left, 03/12/2016); Cardiac catheterization (2009? ); Cardiac catheterization (05/2012); Port-a-cath removal (2018); Liver biopsy (N/A,  09/12/2017); Cholecystectomy (N/A, 09/12/2017); Trigger finger release (Right, 09/2018); and Spine surgery. ?Family: family history includes Asthma in her mother; Breast cancer in her maternal aunt; Cancer in her father; Heart attack in her mother; Liver cancer in her father. ? ?Laboratory Chemistry Profile  ? ?Renal ?Lab Results  ?Component Value Date  ? BUN 20 05/13/2021  ?  CREATININE 1.19 (H) 05/13/2021  ? LABCREA 113 07/22/2021  ? BCR 17 05/13/2021  ? GFRAA 75 04/23/2019  ? GFRNONAA 65 04/23/2019  ? ?  Hepatic ?Lab Results  ?Component Value Date  ? AST 37 12/12/2019  ? ALT

## 2021-11-03 NOTE — Patient Instructions (Signed)

## 2021-11-06 ENCOUNTER — Ambulatory Visit (INDEPENDENT_AMBULATORY_CARE_PROVIDER_SITE_OTHER): Payer: Medicare HMO | Admitting: Family Medicine

## 2021-11-06 ENCOUNTER — Encounter: Payer: Self-pay | Admitting: Family Medicine

## 2021-11-06 VITALS — BP 118/82 | HR 89 | Temp 98.0°F | Resp 18 | Ht 64.0 in | Wt 174.0 lb

## 2021-11-06 DIAGNOSIS — I10 Essential (primary) hypertension: Secondary | ICD-10-CM | POA: Diagnosis not present

## 2021-11-06 DIAGNOSIS — R634 Abnormal weight loss: Secondary | ICD-10-CM

## 2021-11-06 DIAGNOSIS — R194 Change in bowel habit: Secondary | ICD-10-CM | POA: Diagnosis not present

## 2021-11-06 DIAGNOSIS — N812 Incomplete uterovaginal prolapse: Secondary | ICD-10-CM

## 2021-11-06 DIAGNOSIS — Z85048 Personal history of other malignant neoplasm of rectum, rectosigmoid junction, and anus: Secondary | ICD-10-CM

## 2021-11-06 DIAGNOSIS — R69 Illness, unspecified: Secondary | ICD-10-CM | POA: Diagnosis not present

## 2021-11-06 DIAGNOSIS — J029 Acute pharyngitis, unspecified: Secondary | ICD-10-CM

## 2021-11-06 DIAGNOSIS — H8112 Benign paroxysmal vertigo, left ear: Secondary | ICD-10-CM

## 2021-11-06 DIAGNOSIS — R1084 Generalized abdominal pain: Secondary | ICD-10-CM | POA: Diagnosis not present

## 2021-11-06 DIAGNOSIS — N200 Calculus of kidney: Secondary | ICD-10-CM | POA: Diagnosis not present

## 2021-11-06 DIAGNOSIS — N816 Rectocele: Secondary | ICD-10-CM

## 2021-11-06 DIAGNOSIS — G4733 Obstructive sleep apnea (adult) (pediatric): Secondary | ICD-10-CM | POA: Diagnosis not present

## 2021-11-06 DIAGNOSIS — F339 Major depressive disorder, recurrent, unspecified: Secondary | ICD-10-CM

## 2021-11-06 DIAGNOSIS — G8929 Other chronic pain: Secondary | ICD-10-CM | POA: Diagnosis not present

## 2021-11-06 DIAGNOSIS — E785 Hyperlipidemia, unspecified: Secondary | ICD-10-CM | POA: Diagnosis not present

## 2021-11-06 DIAGNOSIS — F419 Anxiety disorder, unspecified: Secondary | ICD-10-CM

## 2021-11-06 DIAGNOSIS — M542 Cervicalgia: Secondary | ICD-10-CM | POA: Diagnosis not present

## 2021-11-06 DIAGNOSIS — E213 Hyperparathyroidism, unspecified: Secondary | ICD-10-CM | POA: Diagnosis not present

## 2021-11-06 DIAGNOSIS — H9202 Otalgia, left ear: Secondary | ICD-10-CM

## 2021-11-06 DIAGNOSIS — E441 Mild protein-calorie malnutrition: Secondary | ICD-10-CM | POA: Diagnosis not present

## 2021-11-06 DIAGNOSIS — E119 Type 2 diabetes mellitus without complications: Secondary | ICD-10-CM | POA: Diagnosis not present

## 2021-11-06 DIAGNOSIS — N939 Abnormal uterine and vaginal bleeding, unspecified: Secondary | ICD-10-CM

## 2021-11-06 MED ORDER — HYDROXYZINE HCL 10 MG PO TABS: 10.0000 mg | ORAL_TABLET | Freq: Three times a day (TID) | ORAL | 0 refills | Status: DC | PRN

## 2021-11-06 NOTE — Progress Notes (Signed)
Name: Judy Allen   MRN: [id number]    DOB: [birth year]   Date:11/06/2021 ? ?     Progress Note ? ?Subjective ? ?Chief Complaint ? ?Acute visit for Sore throat and weight loss  ? ?HPI ? ?Weight loss: she has lost 16 lbs since her visit in Dec 22. She has a lack of appetite, also noticing change in bowel movements with episodes of diarrhea once or twice a week. She also noticed some blood in stools last night when she strained , she states also had blood when she wiped and she thinks it was from her vagina. She has a history of anal cancer and had radiation therapy.  ? ?Sore throat/ear pain/vertigo: she noticed a sore throat, not triggered by eating over the past couple of months, also left ear pain that is sharp and intermittent for the past 4 months and recently also gets dizzy when moves her head, but that is intermittently. Not associated with nausea or vomiting, no hearing loss.  ? ?Vaginal bulging: she notices a bulging in her vagina during bowel movements. Last night it seems like it bleed after straining and also had blood in her pad a few hours later.  ? ?Anxiety/MDD: she asked to be off Abilify because of tremors and tremors resolved but is feeling more nervous and anxious, we will try hydroxyzine to take up to TID, discussed possible side effects ? ? ?Patient Active Problem List  ? Diagnosis Date Noted  ? Bilateral hip pain 11/03/2021  ? Hypotension due to drugs 05/03/2021  ? Disorder of SI (sacroiliac) joint 11/11/2020  ? Arthritis of sacroiliac joint of both sides 11/11/2020  ? Chronic radicular lumbar pain 11/11/2020  ? Hyperparathyroidism ([place]) 08/25/2019  ? Diabetes mellitus type 2 in obese ([place]) 11/06/2018  ? Cervical radiculopathy (left) 08/31/2018  ? Osteoarthritis of spine with radiculopathy, cervical region 08/31/2018  ? History of anal cancer 05/30/2018  ? Myofascial pain syndrome, cervical 05/16/2018  ? Chronic pain syndrome 05/16/2018  ? Cervical facet joint syndrome 05/16/2018  ? Perihepatitis  ([place]) 09/27/2017  ? Fatty liver 12/22/2016  ? Chronic respiratory failure with hypoxia, on home oxygen therapy ([place]) 07/28/2016  ? Centrilobular emphysema ([place]) 07/14/2016  ? Abnormal Pap smear of cervix 04/14/2016  ? Non-thrombocytopenic purpura ([place]) 01/12/2016  ? Chronic constipation 10/16/2015  ? History of fusion of cervical spine 04/02/2015  ? Benign hypertension 01/16/2015  ? Chronic cervical pain 01/16/2015  ? CN (constipation) 01/16/2015  ? Gastric reflux 01/16/2015  ? Hypertriglyceridemia 01/16/2015  ? Edema leg 01/16/2015  ? Chronic recurrent major depressive disorder ([place]) 01/16/2015  ? Dysmetabolic syndrome 62/94/7654  ? Obstructive apnea 01/16/2015  ? Psoriasis 01/16/2015  ? Allergic rhinitis 01/16/2015  ? Bursitis, trochanteric 01/16/2015  ? GAD (generalized anxiety disorder) 01/16/2015  ? Tachycardia, paroxysmal ([place])   ? ? ?Past Surgical History:  ?Procedure Laterality Date  ? ANTERIOR FUSION LUMBAR SPINE  1990  ? plate in back, not metal  ? CARDIAC CATHETERIZATION  2009?   ? [facility]  ? CARDIAC CATHETERIZATION  05/2012  ? [facility]: Mild hypertension 37/12 with a mean pressure of 21 mm mercury  ? CHOLECYSTECTOMY N/A 09/12/2017  ? Procedure: LAPAROSCOPIC CHOLECYSTECTOMY WITH INTRAOPERATIVE CHOLANGIOGRAM;  Surgeon: Robert Bellow, MD;  Location: [facility] ORS;  Service: General;  Laterality: N/A;  ? COLONOSCOPY  2018  ? herniated disc repair  2001  ? LIVER BIOPSY N/A 09/12/2017  ? Procedure: LIVER BIOPSY;  Surgeon: Robert Bellow, MD;  Location: [facility];  Service: General;  Laterality: N/A;  ? MASS EXCISION Left 02/23/2016  ? Procedure: EXCISION MASS;  Surgeon: Christene Lye, MD;  Location: [facility] OR[facility];  Service: General;  Laterality: Left;  ? NECK SURGERY    ? PORT-A-CATH REMOVAL  2018  ? PORTACATH PLACEMENT Left 03/12/2016  ? Procedure: INSERTION PORT-A-CATH;  Surgeon: Christene Lye, MD;  Location: [facility];  Service: General;  Laterality: Left;  ? SPINE SURGERY    ? tonsillectomy  1959   ? history  ? TRIGGER FINGER RELEASE Right 09/2018  ? Dr. Sabra Heck   ? TUBAL LIGATION    ? s/p  ? ? ?Family History  ?Problem Relation Age of Onset  ? Heart attack Mother   ? Asthma Mother   ? Liver cancer Father   ? Cancer Father   ? Breast cancer Maternal Aunt   ? Bladder Cancer Neg Hx   ? Kidney cancer Neg Hx   ? Colon cancer Neg Hx   ? ? ?Social History  ? ?Tobacco Use  ? Smoking status: Former  ?  Packs/day: 1.00  ?  Years: 32.00  ?  Pack years: 32.00  ?  Types: Cigarettes  ?  Start date: 08/10/1963  ?  Quit date: 01/15/2006  ?  Years since quitting: 15.8  ? Smokeless tobacco: Never  ?Substance Use Topics  ? Alcohol use: No  ? ? ? ?Current Outpatient Medications:  ?  albuterol (PROVENTIL HFA;VENTOLIN HFA) 108 (90 Base) MCG/ACT inhaler, Inhale 2 puffs into the lungs every 6 (six) hours as needed for wheezing or shortness of breath., Disp: , Rfl:  ?  blood glucose meter kit and supplies, Dispense based on patient and insurance preference. Use up to four times daily as directed. (FOR ICD-10 E10.9, E11.9)., Disp: 1 each, Rfl: 0 ?  CONTOUR NEXT TEST test strip, USE UP TO FOUR TIMES DAILY AS DIRECTED, Disp: 100 each, Rfl: 5 ?  EFFEXOR XR 75 MG 24 hr capsule, Take 3 capsules (225 mg total) by mouth daily with breakfast., Disp: 270 capsule, Rfl: 1 ?  gabapentin (NEURONTIN) 300 MG capsule, TAKE 1 CAPSULE(300 MG) BY MOUTH THREE TIMES DAILY, Disp: 270 capsule, Rfl: 2 ?  gemfibrozil (LOPID) 600 MG tablet, Take 600 mg by mouth 2 (two) times daily., Disp: , Rfl:  ?  hydrocortisone 2.5 % cream, Apply topically 2 (two) times daily., Disp: 30 g, Rfl: 0 ?  metoprolol succinate (TOPROL-XL) 50 MG 24 hr tablet, Take by mouth., Disp: , Rfl:  ?  Microlet Lancets MISC, USE FOUR TIMES DAILY, Disp: 100 each, Rfl: 5 ?  NIFEdipine (PROCARDIA-XL/NIFEDICAL-XL) 30 MG 24 hr tablet, Take by mouth., Disp: , Rfl:  ?  omega-3 acid ethyl esters (LOVAZA) 1 g capsule, TAKE 2 CAPSULES BY MOUTH TWO TIMES A DAY Strength: 1 g, Disp: 360 capsule, Rfl: 0 ?   omeprazole (PRILOSEC) 40 MG capsule, Take 1 capsule (40 mg total) by mouth daily., Disp: 90 capsule, Rfl: 0 ?  OXYGEN, Inhale 2 L into the lungs continuous. , Disp: , Rfl:  ?  telmisartan (MICARDIS) 80 MG tablet, Take 1 tablet by mouth at bedtime., Disp: , Rfl:  ?  tiZANidine (ZANAFLEX) 4 MG tablet, Take 1 tablet (4 mg total) by mouth every 8 (eight) hours as needed., Disp: 270 tablet, Rfl: 2 ?  torsemide (DEMADEX) 5 MG tablet, Take 1 tablet by mouth daily., Disp: , Rfl:  ?  [START ON 11/19/2021] traMADol (ULTRAM) 50 MG tablet, Take 1 tablet (50 mg total)  by mouth every 6 (six) hours as needed for severe pain. Each fill must last 30 days, Disp: 120 tablet, Rfl: 5 ?  umeclidinium-vilanterol (ANORO ELLIPTA) 62.5-25 MCG/ACT AEPB, Inhale into the lungs., Disp: , Rfl:  ?  Vitamin D, Ergocalciferol, (DRISDOL) 1.25 MG (50000 UT) CAPS capsule, Take 1 capsule (50,000 Units total) by mouth every 7 (seven) days., Disp: 12 capsule, Rfl: 1 ? ?Allergies  ?Allergen Reactions  ? Dapsone Other (See Comments)  ?  Hypoxemia  ? Hydrochlorothiazide   ?  History of hyperparathyroidism   ? Sulfa Antibiotics Rash  ? Sulfasalazine Rash  ? ? ?I personally reviewed active problem list, medication list, allergies, family history with the patient/caregiver today. ? ? ?ROS ? ?Constitutional: Negative for fever, positive  weight change.  ?Respiratory: Negative for cough but positive for shortness of breath.   ?Cardiovascular: Negative for chest pain or palpitations.  ?Gastrointestinal: positive for abdominal pain, and  bowel changes.  ?Musculoskeletal: Negative for gait problem or joint swelling.  ?Skin: Negative for rash.  ?Neurological: positive  for dizziness but no headache.  ?No other specific complaints in a complete review of systems (except as listed in HPI above).  ? ?Objective ? ?Vitals:  ? 11/06/21 1009  ?BP: 118/82  ?Pulse: 89  ?Resp: 18  ?Temp: 98 ?F (36.7 ?C)  ?TempSrc: Oral  ?SpO2: 91%  ?Weight: 174 lb (78.9 kg)  ?Height: 5' 4"   (1.626 m)  ? ? ?Body mass index is 29.87 kg/m?. ? ?Physical Exam ? ?Constitutional: Patient appears well-developed and malnourished  No distress.  ?HEENT: head atraumatic, normocephalic, pupils equal and r

## 2021-11-07 DIAGNOSIS — C211 Malignant neoplasm of anal canal: Secondary | ICD-10-CM | POA: Diagnosis not present

## 2021-11-07 DIAGNOSIS — I270 Primary pulmonary hypertension: Secondary | ICD-10-CM | POA: Diagnosis not present

## 2021-11-07 DIAGNOSIS — R0902 Hypoxemia: Secondary | ICD-10-CM | POA: Diagnosis not present

## 2021-11-07 LAB — TOXASSURE SELECT 13 (MW), URINE

## 2021-11-09 ENCOUNTER — Encounter: Payer: Self-pay | Admitting: Family Medicine

## 2021-11-10 ENCOUNTER — Ambulatory Visit: Payer: Medicare HMO | Admitting: Obstetrics and Gynecology

## 2021-11-10 ENCOUNTER — Encounter: Payer: Self-pay | Admitting: Obstetrics and Gynecology

## 2021-11-10 ENCOUNTER — Other Ambulatory Visit: Payer: Self-pay | Admitting: Obstetrics and Gynecology

## 2021-11-10 VITALS — BP 119/76 | HR 86 | Ht 64.0 in | Wt 172.8 lb

## 2021-11-10 DIAGNOSIS — K644 Residual hemorrhoidal skin tags: Secondary | ICD-10-CM

## 2021-11-10 DIAGNOSIS — N816 Rectocele: Secondary | ICD-10-CM

## 2021-11-10 DIAGNOSIS — R634 Abnormal weight loss: Secondary | ICD-10-CM

## 2021-11-10 DIAGNOSIS — N950 Postmenopausal bleeding: Secondary | ICD-10-CM

## 2021-11-10 DIAGNOSIS — N8110 Cystocele, unspecified: Secondary | ICD-10-CM

## 2021-11-10 DIAGNOSIS — Z85048 Personal history of other malignant neoplasm of rectum, rectosigmoid junction, and anus: Secondary | ICD-10-CM | POA: Diagnosis not present

## 2021-11-10 MED ORDER — SENNOSIDES-DOCUSATE SODIUM 8.6-50 MG PO TABS: 1.0000 | ORAL_TABLET | Freq: Every day | ORAL | 1 refills | Status: DC

## 2021-11-10 NOTE — Progress Notes (Signed)
? ? ?GYNECOLOGY PROGRESS NOTE ? ?Subjective:  ? ? Patient ID: Judy Allen, female    DOB: 18-May-1956, 66 y.o.   MRN: [id number] ? ?HPI ? Patient is a 66 y.o. G31P4 female who presents for evaluation of postmenopausal bleeding and possible pelvic prolapse. She was referred by her PCP, Dr, Steele Sizer, at [facility]. Michael has a PMH of anal cancer, DM, fatty liver, chronic respiratory failure, depression and anxiety.  ? ?Patient reports that she has been experiencing weight loss since December, has lost almost 20 lbs. She does note that she has had issues with bowel movements for the past few months.  She noted to her PCP yesterday that she has had some bouts of diarrhea, however today notes that she has been experiencing mostly constipation.  Is having to strain to have bowel movements, which is when she notices the prolapse from the vagina.  Last BM was Thursday. Notes that with this bowel movement, she noticed some bleeding, however when wiping, noted that she felt the bleeding was not from the rectum, but from the vagina.  ? ?Patient does not have a past history of PMB. She is up to date on her pap smear (performed 08/2020, normal).  Is not currently on any HRT.  ? ?The following portions of the patient's history were reviewed and updated as appropriate: allergies, current medications, past family history, past medical history, past social history, past surgical history, and problem list. ? ?Review of Systems ?Pertinent items noted in HPI and remainder of comprehensive ROS otherwise negative.  ? ?Objective:  ? Blood pressure 119/76, pulse 86, height 5' 4"  (1.626 m), weight 172 lb 12.8 oz (78.4 kg), last menstrual period 03/04/2002.  Body mass index is 29.66 kg/m?. ?General appearance: alert and no distress ?Abdomen: soft, non-tender; bowel sounds normal; no masses,  no organomegaly ?Pelvic exam:  ?   - VULVA: mild excoriations noted on vulva, radiation changes present.  ?   - VAGINA: moderately  atrophic, Grade 2 cystocele, Grade 2-3 rectocele.  ?   - CERVIX: normal appearing cervix without discharge or lesions, atrophic ?   - UTERUS: uterus is normal size, shape, consistency and non-tender ?   - ADNEXA: normal adnexa in size, nontender and no masses ?   -  RECTAL: rectovaginal exam confirms pelvic findings, external hemorrhoids non-thrombosed.  No palpable masses  ?Extremities: extremities normal, atraumatic, no cyanosis or edema ?Neurologic: Grossly normal ? ? ?Assessment:  ? ?1. PMB (postmenopausal bleeding)   ?2. Unintended weight loss   ?3. Cystocele with rectocele   ?4. History of anal cancer   ?5. External hemorrhoids   ?  ? ?Plan:  ? ?1. PMB (postmenopausal bleeding) ? ?- One episode of bleeding with wiping.  Discussed that bleeding may have been due to vaginal atrophy, vs hemorrhoids.  Cannot completely r/o endometrial cause. Will order imaging to assess pelvis (CT scan ordered due to unintentional weight loss).  ? ?2. Unintended weight loss ? ?- CT ABDOMEN PELVIS W CONTRAST; Future.  ?- Patient with h/o anal cancer. Notes up to date colonoscopy in 2017 at [facility]. Is having bowel changes.  Discussed use of stool softeners/laxatives to help relieve constipation.  ? ?3. Cystocele with rectocele ?- Cystocele with rectocele, discussed that this is likely worsening due to straining with bowel movements over the past few months.  Counseled on use of stool softeners/laxatives for constipation. Also discussed options of pelvic floor PT, pessary, and surgery (although would have to have  medical clearance to assess if patient is a candidate).  ? ?4. History of anal cancer ? ?- CT ABDOMEN PELVIS W CONTRAST; Future ?- Up to date with colonoscopy. Several external hemorroids present.  Weight loss over the past 3 months. Will assess for malignant causes.  ? ?5. External hemorrhoids ? - Reports no current issues with external hemorrhoids.  ?- Can utilize Preparation-H, Tuck's pads or can prescribe medication  if needed.  ? ? ?Follow up in 2-3 weeks to review CT scan and further discuss management of prolapse.  ? ? ?Rubie Maid, MD ?[facility]  ?

## 2021-11-11 ENCOUNTER — Encounter: Payer: Self-pay | Admitting: Student in an Organized Health Care Education/Training Program

## 2021-11-11 ENCOUNTER — Ambulatory Visit
Admission: RE | Admit: 2021-11-11 | Discharge: 2021-11-11 | Disposition: A | Discharge status: Home / Self Care | Payer: Medicare HMO | Source: Ambulatory Visit | Attending: Student in an Organized Health Care Education/Training Program | Admitting: Student in an Organized Health Care Education/Training Program

## 2021-11-11 ENCOUNTER — Ambulatory Visit (HOSPITAL_BASED_OUTPATIENT_CLINIC_OR_DEPARTMENT_OTHER): Payer: Medicare HMO | Admitting: Student in an Organized Health Care Education/Training Program

## 2021-11-11 VITALS — BP 123/67 | HR 82 | Temp 98.1°F | Resp 16 | Ht 64.0 in | Wt 172.0 lb

## 2021-11-11 DIAGNOSIS — G894 Chronic pain syndrome: Secondary | ICD-10-CM

## 2021-11-11 DIAGNOSIS — M7061 Trochanteric bursitis, right hip: Secondary | ICD-10-CM | POA: Diagnosis not present

## 2021-11-11 DIAGNOSIS — M25552 Pain in left hip: Secondary | ICD-10-CM | POA: Diagnosis not present

## 2021-11-11 DIAGNOSIS — M7062 Trochanteric bursitis, left hip: Secondary | ICD-10-CM | POA: Diagnosis not present

## 2021-11-11 DIAGNOSIS — M25551 Pain in right hip: Secondary | ICD-10-CM

## 2021-11-11 MED ORDER — DEXAMETHASONE SODIUM PHOSPHATE 10 MG/ML IJ SOLN
INTRAMUSCULAR | Status: AC
  Filled 2021-11-11: qty 1

## 2021-11-11 MED ORDER — ROPIVACAINE HCL 2 MG/ML IJ SOLN
9.0000 mL | Freq: Once | INTRAMUSCULAR | Status: AC
  Administered 2021-11-11: 9 mL via PERINEURAL

## 2021-11-11 MED ORDER — DEXAMETHASONE SODIUM PHOSPHATE 10 MG/ML IJ SOLN
10.0000 mg | Freq: Once | INTRAMUSCULAR | Status: AC
  Administered 2021-11-11: 10 mg

## 2021-11-11 MED ORDER — IOHEXOL 180 MG/ML  SOLN
10.0000 mL | Freq: Once | INTRAMUSCULAR | Status: AC
  Administered 2021-11-11: 5 mL via INTRA_ARTICULAR

## 2021-11-11 MED ORDER — LIDOCAINE HCL (PF) 2 % IJ SOLN
INTRAMUSCULAR | Status: AC
  Filled 2021-11-11: qty 10

## 2021-11-11 MED ORDER — LIDOCAINE HCL 2 % IJ SOLN
20.0000 mL | Freq: Once | INTRAMUSCULAR | Status: AC
  Administered 2021-11-11: 100 mg

## 2021-11-11 MED ORDER — ROPIVACAINE HCL 2 MG/ML IJ SOLN
INTRAMUSCULAR | Status: AC
  Filled 2021-11-11: qty 20

## 2021-11-11 NOTE — Progress Notes (Signed)
Safety precautions to be maintained throughout the outpatient stay will include: orient to surroundings, keep bed in low position, maintain call bell within reach at all times, provide assistance with transfer out of bed and ambulation.  

## 2021-11-11 NOTE — Patient Instructions (Signed)

## 2021-11-11 NOTE — Progress Notes (Signed)
PROVIDER NOTE: Interpretation of information contained herein should be left to medically-trained personnel. Specific patient instructions are provided elsewhere under "Patient Instructions" section of medical record. This document was created in part using STT-dictation technology, any transcriptional errors that may result from this process are unintentional.  ?Patient: Judy Allen ?Type: Established ?DOB: 09-03-1955 ?MRN: [id number] ?PCP: Steele Sizer, MD  Service: Procedure ?DOS: 11/11/2021 ?Setting: Ambulatory ?Location: Ambulatory outpatient facility ?Delivery: Face-to-face Provider: Gillis Santa, MD ?Specialty: Interventional Pain Management ?Specialty designation: 09 ?Location: Outpatient facility ?Ref. Prov.: Steele Sizer, MD   ? ?Primary Reason for Visit: Interventional Pain Management Treatment. ?CC: Hip Pain ? ?  ?Procedure:          Anesthesia, Analgesia, Anxiolysis:  ?Type: Hip bursa injection #1  ?Primary Purpose: Diagnostic ?Region: Upper (proximal) Femoral Region ?Level: Hip Joint ?Target Area: Trochanteric Bursa ?Approach: Anterior approach ?Laterality: Bilateral  Type: Local Anesthesia ?Local Anesthetic: Lidocaine 1-2% ?Sedation: None  ?Indication(s):  Analgesia ?Route: Infiltration (/IM) ?IV Access: N/A ? ? ?Position: Supine  ? ?1. Trochanteric bursitis of both hips   ?2. Bilateral hip pain   ?3. Chronic pain syndrome   ? ?NAS-11 Pain score:  ? Pre-procedure: 5 /10  ? Post-procedure: 1 /10  ? ?  ?Pre-op H&P Assessment:  ?Judy Allen is a 66 y.o. (year old), female patient, seen today for interventional treatment. She  has a past surgical history that includes Tubal ligation; tonsillectomy (2440); herniated disc repair (2001); Anterior fusion lumbar spine (1990); Neck surgery; Mass excision (Left, 02/23/2016); Colonoscopy (2018); Portacath placement (Left, 03/12/2016); Cardiac catheterization (2009? ); Cardiac catheterization (05/2012); Port-a-cath removal (2018); Liver biopsy (N/A,  09/12/2017); Cholecystectomy (N/A, 09/12/2017); Trigger finger release (Right, 09/2018); and Spine surgery. Judy Allen has a current medication list which includes the following prescription(s): albuterol, blood glucose meter kit and supplies, contour next test, effexor xr, gabapentin, gemfibrozil, hydrocortisone, hydroxyzine, metoprolol succinate, microlet lancets, nifedipine, omega-3 acid ethyl esters, omeprazole, oxygen-helium, senna-docusate, telmisartan, tizanidine, torsemide, [START ON 11/19/2021] tramadol, anoro ellipta, and vitamin d (ergocalciferol). Her primarily concern today is the Hip Pain ? ?Initial Vital Signs:  ?Pulse/HCG Rate: 82ECG Heart Rate: 81 ?Temp: 98.1 ?F (36.7 ?C) ?Resp: 16 ?BP: 130/84 ?SpO2: 91 % (2 L pts own) ? ?BMI: Estimated body mass index is 29.52 kg/m? as calculated from the following: ?  Height as of this encounter: 5' 4"  (1.626 m). ?  Weight as of this encounter: 172 lb (78 kg). ? ?Risk Assessment: ?Allergies: Reviewed. She is allergic to dapsone, hydrochlorothiazide, sulfa antibiotics, and sulfasalazine.  ?Allergy Precautions: None required ?Coagulopathies: Reviewed. None identified.  ?Blood-thinner therapy: None at this time ?Active Infection(s): Reviewed. None identified. Judy Allen is afebrile ? ?Site Confirmation: Judy Allen was asked to confirm the procedure and laterality before marking the site ?Procedure checklist: Completed ?Consent: Before the procedure and under the influence of no sedative(s), amnesic(s), or anxiolytics, the patient was informed of the treatment options, risks and possible complications. To fulfill our ethical and legal obligations, as recommended by the American Medical Association's Code of Ethics, I have informed the patient of my clinical impression; the nature and purpose of the treatment or procedure; the risks, benefits, and possible complications of the intervention; the alternatives, including doing nothing; the risk(s) and benefit(s) of the  alternative treatment(s) or procedure(s); and the risk(s) and benefit(s) of doing nothing. ?The patient was provided information about the general risks and possible complications associated with the procedure. These may include, but are not limited to: failure to achieve desired goals,  infection, bleeding, organ or nerve damage, allergic reactions, paralysis, and death. ?In addition, the patient was informed of those risks and complications associated to the procedure, such as failure to decrease pain; infection; bleeding; organ or nerve damage with subsequent damage to sensory, motor, and/or autonomic systems, resulting in permanent pain, numbness, and/or weakness of one or several areas of the body; allergic reactions; (i.e.: anaphylactic reaction); and/or death. ?Furthermore, the patient was informed of those risks and complications associated with the medications. These include, but are not limited to: allergic reactions (i.e.: anaphylactic or anaphylactoid reaction(s)); adrenal axis suppression; blood sugar elevation that in diabetics may result in ketoacidosis or comma; water retention that in patients with history of congestive heart failure may result in shortness of breath, pulmonary edema, and decompensation with resultant heart failure; weight gain; swelling or edema; medication-induced neural toxicity; particulate matter embolism and blood vessel occlusion with resultant organ, and/or nervous system infarction; and/or aseptic necrosis of one or more joints. ?Finally, the patient was informed that Medicine is not an exact science; therefore, there is also the possibility of unforeseen or unpredictable risks and/or possible complications that may result in a catastrophic outcome. The patient indicated having understood very clearly. We have given the patient no guarantees and we have made no promises. Enough time was given to the patient to ask questions, all of which were answered to the patient's  satisfaction. Judy Allen has indicated that she wanted to continue with the procedure. ?Attestation: I, the ordering provider, attest that I have discussed with the patient the benefits, risks, side-effects, alternatives, likelihood of achieving goals, and potential problems during recovery for the procedure that I have provided informed consent. ?Date  Time: 11/11/2021 12:42 PM ? ?Pre-Procedure Preparation:  ?Monitoring: As per clinic protocol. Respiration, ETCO2, SpO2, BP, heart rate and rhythm monitor placed and checked for adequate function ?Safety Precautions: Patient was assessed for positional comfort and pressure points before starting the procedure. ?Time-out: I initiated and conducted the "Time-out" before starting the procedure, as per protocol. The patient was asked to participate by confirming the accuracy of the "Time Out" information. Verification of the correct person, site, and procedure were performed and confirmed by me, the nursing staff, and the patient. "Time-out" conducted as per Joint Commission's Universal Protocol (UP.01.01.01). ?Time: 1610 ? ?Description of Procedure:          ?Area Prepped: Entire Anterolateral hip area. ?DuraPrep (Iodine Povacrylex [0.7% available iodine] and Isopropyl Alcohol, 74% w/w) ?Safety Precautions: Aspiration looking for blood return was conducted prior to all injections. At no point did we inject any substances, as a needle was being advanced. No attempts were made at seeking any paresthesias. Safe injection practices and needle disposal techniques used. Medications properly checked for expiration dates. SDV (single dose vial) medications used. ?Description of the Procedure: Protocol guidelines were followed. The patient was placed in position over the procedure table. The target area was identified and the area prepped in the usual manner. Skin & deeper tissues infiltrated with local anesthetic. Appropriate amount of time allowed to pass for local anesthetics to  take effect. The procedure needles were then advanced to the target area. Proper needle placement secured. Negative aspiration confirmed. Solution injected in intermittent fashion, asking for systemic symptoms every 0.5cc

## 2021-11-12 ENCOUNTER — Telehealth: Payer: Self-pay

## 2021-11-12 NOTE — Telephone Encounter (Signed)
Pt was called concerning her procedure yesterday. She reported no problems. ?

## 2021-11-18 DIAGNOSIS — J9611 Chronic respiratory failure with hypoxia: Secondary | ICD-10-CM | POA: Diagnosis not present

## 2021-11-18 DIAGNOSIS — J432 Centrilobular emphysema: Secondary | ICD-10-CM | POA: Diagnosis not present

## 2021-11-18 DIAGNOSIS — R634 Abnormal weight loss: Secondary | ICD-10-CM | POA: Diagnosis not present

## 2021-11-18 DIAGNOSIS — I272 Pulmonary hypertension, unspecified: Secondary | ICD-10-CM | POA: Diagnosis not present

## 2021-11-18 DIAGNOSIS — Z6831 Body mass index (BMI) 31.0-31.9, adult: Secondary | ICD-10-CM | POA: Diagnosis not present

## 2021-11-18 DIAGNOSIS — Z9981 Dependence on supplemental oxygen: Secondary | ICD-10-CM | POA: Diagnosis not present

## 2021-11-20 ENCOUNTER — Encounter: Payer: Self-pay | Admitting: Family Medicine

## 2021-11-20 ENCOUNTER — Other Ambulatory Visit: Payer: Self-pay

## 2021-11-20 DIAGNOSIS — R1084 Generalized abdominal pain: Secondary | ICD-10-CM

## 2021-11-20 DIAGNOSIS — R194 Change in bowel habit: Secondary | ICD-10-CM

## 2021-11-24 ENCOUNTER — Ambulatory Visit
Admission: RE | Admit: 2021-11-24 | Discharge: 2021-11-24 | Disposition: A | Discharge status: Home / Self Care | Payer: Medicare HMO | Source: Ambulatory Visit | Attending: Obstetrics and Gynecology | Admitting: Obstetrics and Gynecology

## 2021-11-24 DIAGNOSIS — Z85048 Personal history of other malignant neoplasm of rectum, rectosigmoid junction, and anus: Secondary | ICD-10-CM | POA: Diagnosis not present

## 2021-11-24 DIAGNOSIS — R634 Abnormal weight loss: Secondary | ICD-10-CM | POA: Insufficient documentation

## 2021-11-24 DIAGNOSIS — N950 Postmenopausal bleeding: Secondary | ICD-10-CM | POA: Diagnosis not present

## 2021-11-24 DIAGNOSIS — I700 Atherosclerosis of aorta: Secondary | ICD-10-CM | POA: Diagnosis not present

## 2021-11-24 DIAGNOSIS — K760 Fatty (change of) liver, not elsewhere classified: Secondary | ICD-10-CM | POA: Diagnosis not present

## 2021-11-24 LAB — POCT I-STAT CREATININE: Creatinine, Ser: 0.9 mg/dL (ref 0.44–1.00)

## 2021-11-24 MED ORDER — IOHEXOL 300 MG/ML  SOLN
100.0000 mL | Freq: Once | INTRAMUSCULAR | Status: AC | PRN
Start: 2021-11-24 — End: 2021-11-24
  Administered 2021-11-24: 100 mL via INTRAVENOUS

## 2021-11-26 ENCOUNTER — Telehealth: Payer: Self-pay | Admitting: *Deleted

## 2021-11-26 NOTE — Telephone Encounter (Signed)
Attempted to call for pre appointment review of allergies/meds. Message left. 

## 2021-11-30 ENCOUNTER — Encounter: Payer: Self-pay | Admitting: Student in an Organized Health Care Education/Training Program

## 2021-11-30 ENCOUNTER — Ambulatory Visit
Payer: Medicare HMO | Attending: Student in an Organized Health Care Education/Training Program | Admitting: Student in an Organized Health Care Education/Training Program

## 2021-11-30 DIAGNOSIS — G894 Chronic pain syndrome: Secondary | ICD-10-CM | POA: Diagnosis not present

## 2021-11-30 DIAGNOSIS — M542 Cervicalgia: Secondary | ICD-10-CM | POA: Diagnosis not present

## 2021-11-30 DIAGNOSIS — G8929 Other chronic pain: Secondary | ICD-10-CM | POA: Diagnosis not present

## 2021-11-30 DIAGNOSIS — M533 Sacrococcygeal disorders, not elsewhere classified: Secondary | ICD-10-CM

## 2021-11-30 DIAGNOSIS — M25551 Pain in right hip: Secondary | ICD-10-CM | POA: Diagnosis not present

## 2021-11-30 DIAGNOSIS — M25552 Pain in left hip: Secondary | ICD-10-CM | POA: Diagnosis not present

## 2021-11-30 DIAGNOSIS — M7062 Trochanteric bursitis, left hip: Secondary | ICD-10-CM

## 2021-11-30 DIAGNOSIS — M7061 Trochanteric bursitis, right hip: Secondary | ICD-10-CM | POA: Diagnosis not present

## 2021-11-30 NOTE — Progress Notes (Signed)
? ? ?GYNECOLOGY PROGRESS NOTE ? ?Subjective:  ? ? Patient ID: Judy Allen, female    DOB: [birth year], 66 y.o.   MRN: [id number] ? ?HPI ? Patient is a 66 y.o. G28P4 female who presents for follow up in 2-3 weeks to review CT scan and further discuss management of vaginal prolapse (moderate cystocele with rectocele).  CT scan was performed due to patient's complaints of abdominal pain, weight loss and history of anal cancer.  Also with a recent episode of postmenopausal bleeding. ? ?The following portions of the patient's history were reviewed and updated as appropriate: allergies, current medications, past family history, past medical history, past social history, past surgical history, and problem list. ? ?Review of Systems ?Pertinent items noted in HPI and remainder of comprehensive ROS otherwise negative.  ? ?Objective:  ? Blood pressure 133/82, pulse 79, height 5' 4.02" (1.626 m), weight 167 lb 6.4 oz (75.9 kg), last menstrual period 03/04/2002.  Body mass index is 28.72 kg/m?. ? ?General appearance: alert and no distress ?Remainder of exam deferred ? ? ? ?Imaging:  ?CT ABDOMEN PELVIS W CONTRAST ?CLINICAL DATA:  Weight loss.  History of anal cancer. ? ?EXAM: ?CT ABDOMEN AND PELVIS WITH CONTRAST ? ?TECHNIQUE: ?Multidetector CT imaging of the abdomen and pelvis was performed ?using the standard protocol following bolus administration of ?intravenous contrast. ? ?RADIATION DOSE REDUCTION: This exam was performed according to the ?departmental dose-optimization program which includes automated ?exposure control, adjustment of the mA and/or kV according to ?patient size and/or use of iterative reconstruction technique. ? ?CONTRAST:  174m OMNIPAQUE IOHEXOL 300 MG/ML  SOLN ? ?COMPARISON:  07/14/2018. ? ?FINDINGS: ?Lower chest: Dependent lower lobe opacity consistent with ?atelectasis, increased compared to the prior CT. ? ?Hepatobiliary: Decreased attenuation of liver consistent with fatty ?infiltration. No mass or  focal lesion. Status post cholecystectomy. ?No bile duct dilation. ? ?Pancreas: Unremarkable. No pancreatic ductal dilatation or ?surrounding inflammatory changes. ? ?Spleen: Normal in size without focal abnormality. ? ?Adrenals/Urinary Tract: Normal adrenal glands. Two subcentimeter ?low-attenuation lesions arise from the superior margin of the left ?kidney, consistent with cysts. No other renal masses or lesions. No ?stones. No hydronephrosis. ? ?Stomach/Bowel: Stomach is within normal limits. Appendix appears ?normal. No evidence of bowel wall thickening, distention, or ?inflammatory changes. ? ?Vascular/Lymphatic: Aortic atherosclerosis. No enlarged lymph nodes. ? ?Reproductive: Uterus and bilateral adnexa are unremarkable. ? ?Other: No ascites.  No abdominal wall hernia. ? ?Musculoskeletal: No fracture.  No bone lesion. ? ?IMPRESSION: ?1. No acute findings within the abdomen or pelvis. ?2. No evidence of malignancy. ?3. Hepatic steatosis.  Aortic atherosclerosis. ? ?Electronically Signed ?  By: DLajean ManesM.D. ?  On: 11/26/2021 09:38 ? ? ? ?Assessment:  ? ?1. Cystocele with rectocele   ?2. Unintended weight loss   ?3. History of anal cancer   ?  ? ?Plan:  ? ?Reviewed CT scan with patient, essentially normal.  No significant findings identified for cause of her abdominal pain and weight loss.   Patient notes that she does have plans to have an evaluation by GI, as she has also been noting alternating episodes of constipation and diarrhea lately, associated with mucoid discharge. ?No concerning reason for postmenopausal bleeding.  Uterus overall looks normal on CT, however endometrial stripe lining not specifically identified, no overt unusual findings noted of the reproductive organs. ?Unintended weight loss with unclear cause, however patient's daughter who is also present for today's visit notes that her mother was taking Abilify.  Patient reports that  when she herself stopped taking Abilify she lost  approximately 30 pounds or so.  Patient does note that she stopped Abilify around 3 months ago which is when the weight loss started.  This may potentially be because of weight loss. ?History of anal cancer, no obvious concerns for recurrence, however we will still await GI evaluation. ?Cystocele with rectocele -revisited options of pelvic floor PT, pessary, and surgery.  Patient notes that she would desire surgery.  Briefly discussed nature of A&P repair, including postoperative recovery time and expectations.  We will have patient return for preop visit in the next several weeks.  Will contact patient with several surgical date options.  Handouts given on surgical management option. ? ? ?A total of 20 minutes were spent face-to-face with the patient during this encounter and over half of that time dealt with counseling and coordination of care. ? ?Rubie Maid, MD ?[facility] ? ?

## 2021-11-30 NOTE — Progress Notes (Signed)
Patient: Judy Allen  Service Category: E/M  Provider: Gillis Santa, MD  ?DOB: 07/18/1956  DOS: 11/30/2021  Location: Office  ?MRN: [id number]  Setting: Ambulatory outpatient  Referring Provider: Steele Sizer, MD  ?Type: Established Patient  Specialty: Interventional Pain Management  PCP: Steele Sizer, MD  ?Location: Remote location  Delivery: TeleHealth    ? ?Virtual Encounter - Pain Management ?PROVIDER NOTE: Information contained herein reflects review and annotations entered in association with encounter. Interpretation of such information and data should be left to medically-trained personnel. Information provided to patient can be located elsewhere in the medical record under "Patient Instructions". Document created using STT-dictation technology, any transcriptional errors that may result from process are unintentional.  ?  ?Contact & Pharmacy ?Preferred: [phone] ?Home: [phone] (home) ?Mobile: [phone] (mobile) ?E-mail: [email or web address]  ?[name] [id number] - [place], [address] ?[place] [address] ?Phone: [phone] Fax: [phone] ? ?[facility] [place] [place] [facility] ?[address] [place] ?[place] [address] ?Phone: [phone] Fax: [phone] ? ?[facility] [place], [place] [address] ?[place] [address] ?Phone: [phone] Fax: [phone] ?  ?Pre-screening  ?Ms. Hodgman offered "in-person" vs "virtual" encounter. She indicated preferring virtual for this encounter.  ? ?Reason ?COVID-19*  Social distancing based on CDC and AMA recommendations.  ? ?I contacted Judy Allen on 11/30/2021 via telephone.      I clearly identified myself as Gillis Santa, MD. I verified that I was speaking with the correct person using two identifiers (Name: KLARA STJAMES, and date of birth: 1956-04-21). ? ?Consent ?I sought  verbal advanced consent from Judy Allen for virtual visit interactions. I informed Ms. Racey of possible security and privacy concerns, risks, and limitations associated with providing "not-in-person" medical evaluation and management services. I also informed Ms. Fuhrmann of the availability of "in-person" appointments. Finally, I informed her that there would be a charge for the virtual visit and that she could be  personally, fully or partially, financially responsible for it. Ms. Noguchi expressed understanding and agreed to proceed.  ? ?Historic Elements   ?Ms. CACHE DECOURSEY is a 66 y.o. year old, female patient evaluated today after our last contact on 11/11/2021. Ms. Meditz  has a past medical history of Allergy, Anemia, Anxiety, Arthritis, Colon cancer ([place]) (7867), Complication of anesthesia, Constipation, COPD (chronic obstructive pulmonary disease) ([place]), Depression, Diabetes mellitus without complication ([place]), Dysrhythmia, Emphysema of lung ([place]), GERD (gastroesophageal reflux disease), H/O allergic rhinitis, Heart murmur, Hemorrhoid, Hyperlipidemia, Hypertension, Macular degeneration, Neck pain, chronic, Neuritis, Ovarian failure, Oxygen deficiency, Personal history of chemotherapy, Personal history of radiation therapy, PONV (postoperative nausea and vomiting), Pre-diabetes (2019), Proteinuria, Psoriasis, Sleep apnea (09/12/2016), Tachycardia, paroxysmal ([place]), and Urinary incontinence. She also  has a past surgical history that includes Tubal ligation; tonsillectomy (5449); herniated disc repair (2001); Anterior fusion lumbar spine (1990); Neck surgery; Mass excision (Left, 02/23/2016); Colonoscopy (2018); Portacath placement (Left, 03/12/2016); Cardiac catheterization (2009? ); Cardiac catheterization (05/2012); Port-a-cath removal (2018); Liver biopsy (N/A, 09/12/2017); Cholecystectomy (N/A, 09/12/2017); Trigger finger release (Right, 09/2018); and Spine surgery. Ms. Borkowski has a current medication  list which includes the following prescription(s): albuterol, blood glucose meter kit and supplies, contour next test, effexor xr, gabapentin, gemfibrozil, hydrocortisone, hydroxyzine, metoprolol succinate, microlet lancets, nifedipine, omega-3 acid ethyl esters, omeprazole, oxygen-helium, senna-docusate, telmisartan, tizanidine, torsemide, tramadol, anoro ellipta, and vitamin d (ergocalciferol). She  reports  that she quit smoking about 15 years ago. Her smoking use included cigarettes. She started smoking about 58 years ago. She has a 32.00 pack-year smoking history. She has never used smokeless tobacco. She reports that she does not drink alcohol and does not use drugs. Ms. Strayer is allergic to dapsone, hydrochlorothiazide, sulfa antibiotics, and sulfasalazine.  ? ?HPI  ?Today, she is being contacted for a post-procedure assessment. ? ? ?Post-procedure evaluation  ?  ?Procedure:          Anesthesia, Analgesia, Anxiolysis:  ?Type: Hip bursa injection #1  ?Primary Purpose: Diagnostic ?Region: Upper (proximal) Femoral Region ?Level: Hip Joint ?Target Area: Trochanteric Bursa ?Approach: Anterior approach ?Laterality: Bilateral  Type: Local Anesthesia ?Local Anesthetic: Lidocaine 1-2% ?Sedation: None  ?Indication(s):  Analgesia ?Route: Infiltration ([place]/IM) ?IV Access: N/A ? ? ?Position: Supine  ? ?1. Trochanteric bursitis of both hips   ?2. Bilateral hip pain   ?3. Chronic pain syndrome   ? ?NAS-11 Pain score:  ? Pre-procedure: 5 /10  ? Post-procedure: 1 /10  ? ?   ?Effectiveness:  ?Initial hour after procedure: 90 %  ?Subsequent 4-6 hours post-procedure: 100 %  ?Analgesia past initial 6 hours: 100 % (had no pain x 3 days and then pain returned all at once. back to the same severity as pre procedure.)  ?Ongoing improvement:  ?Analgesic:  <20% ?Function: Back to baseline ?ROM: Back to baseline ? ? ?Pharmacotherapy Assessment  ? ?Opioid Analgesic: Tramadol 50 mg 4 times daily as needed, quantity 120/month; MME equals 20    ? ?Monitoring: ?[place] PMP: PDMP reviewed during this encounter.       ?Pharmacotherapy: No side-effects or adverse reactions reported. ?Compliance: No problems identified. ?Effectiveness: Clinically acceptable. ?Plan: Refer to "POC". UDS:  ?Summary  ?Date Value Ref Range Status  ?11/03/2021 Note  Final  ?  Comment:  ?  ==================================================================== ?[employer]ox[employer] Select 13 (MW) ?==================================================================== ?Test                             Result       Flag       Units ? ?Drug Present and Declared for Prescription Verification ?  Tramadol                       >6098        EXPECTED   ng/mg creat ?  O-Desmethyltramadol            >6098        EXPECTED   ng/mg creat ?  N-Desmethyltramadol            3405         EXPECTED   ng/mg creat ?   Source of tramadol is a prescription medication. O-desmethyltramadol ?   and N-desmethyltramadol are expected metabolites of tramadol. ? ?==================================================================== ?Test                      Result    Flag   Units      Ref Range ?  Creatinine              82               mg/dL      >=20 ?==================================================================== ?Declared Medications: ? The flagging and interpretation on this report are based on the ? following declared medications.  Unexpected results may arise from ? inaccuracies in the declared medications. ? ? **  Note: The testing scope of this panel includes these medications: ? ? Tramadol (Ultram) ? ? **Note: The testing scope of this panel does not include the ? following reported medications: ? ? Albuterol (Ventolin HFA) ? Fish Oil ? Gabapentin (Neurontin) ? Gemfibrozil (Lopid) ? Hydrocortisone ? Influenza Virus Vaccine (Fluzone) ? Metoprolol (Toprol) ? Omeprazole (Prilosec) ? Telmisartan (Micardis) ? Tizanidine (Zanaflex) ? Torsemide (Demadex) ? Umeclidinium (Anoro) ? Venlafaxine (Effexor) ? Vilanterol (Anoro) ?  Vitamin D2 (Drisdol) ?==================================================================== ?For clinical consultation, please call [phone]. ?===============================================================

## 2021-12-02 ENCOUNTER — Ambulatory Visit: Payer: Medicare HMO | Admitting: Obstetrics and Gynecology

## 2021-12-02 ENCOUNTER — Encounter: Payer: Self-pay | Admitting: Obstetrics and Gynecology

## 2021-12-02 VITALS — BP 133/82 | HR 79 | Ht 64.02 in | Wt 167.4 lb

## 2021-12-02 DIAGNOSIS — Z85048 Personal history of other malignant neoplasm of rectum, rectosigmoid junction, and anus: Secondary | ICD-10-CM

## 2021-12-02 DIAGNOSIS — N8110 Cystocele, unspecified: Secondary | ICD-10-CM

## 2021-12-02 DIAGNOSIS — R634 Abnormal weight loss: Secondary | ICD-10-CM

## 2021-12-02 DIAGNOSIS — N816 Rectocele: Secondary | ICD-10-CM | POA: Diagnosis not present

## 2021-12-07 DIAGNOSIS — R0902 Hypoxemia: Secondary | ICD-10-CM | POA: Diagnosis not present

## 2021-12-07 DIAGNOSIS — I270 Primary pulmonary hypertension: Secondary | ICD-10-CM | POA: Diagnosis not present

## 2021-12-07 DIAGNOSIS — C211 Malignant neoplasm of anal canal: Secondary | ICD-10-CM | POA: Diagnosis not present

## 2021-12-08 DIAGNOSIS — Z9981 Dependence on supplemental oxygen: Secondary | ICD-10-CM | POA: Diagnosis not present

## 2021-12-08 DIAGNOSIS — J9611 Chronic respiratory failure with hypoxia: Secondary | ICD-10-CM | POA: Diagnosis not present

## 2021-12-08 DIAGNOSIS — E119 Type 2 diabetes mellitus without complications: Secondary | ICD-10-CM | POA: Diagnosis not present

## 2021-12-08 DIAGNOSIS — K219 Gastro-esophageal reflux disease without esophagitis: Secondary | ICD-10-CM | POA: Diagnosis not present

## 2021-12-08 DIAGNOSIS — Z79899 Other long term (current) drug therapy: Secondary | ICD-10-CM | POA: Diagnosis not present

## 2021-12-08 DIAGNOSIS — J438 Other emphysema: Secondary | ICD-10-CM | POA: Diagnosis not present

## 2021-12-08 DIAGNOSIS — G4736 Sleep related hypoventilation in conditions classified elsewhere: Secondary | ICD-10-CM | POA: Diagnosis not present

## 2021-12-08 DIAGNOSIS — I10 Essential (primary) hypertension: Secondary | ICD-10-CM | POA: Diagnosis not present

## 2021-12-08 DIAGNOSIS — G4733 Obstructive sleep apnea (adult) (pediatric): Secondary | ICD-10-CM | POA: Diagnosis not present

## 2021-12-09 ENCOUNTER — Encounter: Payer: Self-pay | Admitting: Family Medicine

## 2021-12-09 ENCOUNTER — Other Ambulatory Visit: Payer: Self-pay | Admitting: Family Medicine

## 2021-12-09 MED ORDER — BUSPIRONE HCL 5 MG PO TABS: 5.0000 mg | ORAL_TABLET | Freq: Three times a day (TID) | ORAL | 0 refills | Status: DC | PRN

## 2021-12-10 DIAGNOSIS — J9611 Chronic respiratory failure with hypoxia: Secondary | ICD-10-CM | POA: Diagnosis not present

## 2021-12-10 DIAGNOSIS — R634 Abnormal weight loss: Secondary | ICD-10-CM | POA: Diagnosis not present

## 2021-12-10 DIAGNOSIS — J479 Bronchiectasis, uncomplicated: Secondary | ICD-10-CM | POA: Diagnosis not present

## 2021-12-10 DIAGNOSIS — J9811 Atelectasis: Secondary | ICD-10-CM | POA: Diagnosis not present

## 2021-12-10 DIAGNOSIS — R160 Hepatomegaly, not elsewhere classified: Secondary | ICD-10-CM | POA: Diagnosis not present

## 2021-12-10 DIAGNOSIS — Z9981 Dependence on supplemental oxygen: Secondary | ICD-10-CM | POA: Diagnosis not present

## 2021-12-11 DIAGNOSIS — E785 Hyperlipidemia, unspecified: Secondary | ICD-10-CM | POA: Diagnosis not present

## 2021-12-11 DIAGNOSIS — E119 Type 2 diabetes mellitus without complications: Secondary | ICD-10-CM | POA: Diagnosis not present

## 2021-12-11 DIAGNOSIS — N179 Acute kidney failure, unspecified: Secondary | ICD-10-CM | POA: Diagnosis not present

## 2021-12-11 DIAGNOSIS — M542 Cervicalgia: Secondary | ICD-10-CM | POA: Diagnosis not present

## 2021-12-11 DIAGNOSIS — E213 Hyperparathyroidism, unspecified: Secondary | ICD-10-CM | POA: Diagnosis not present

## 2021-12-11 DIAGNOSIS — G4733 Obstructive sleep apnea (adult) (pediatric): Secondary | ICD-10-CM | POA: Diagnosis not present

## 2021-12-11 DIAGNOSIS — G8929 Other chronic pain: Secondary | ICD-10-CM | POA: Diagnosis not present

## 2021-12-11 DIAGNOSIS — I10 Essential (primary) hypertension: Secondary | ICD-10-CM | POA: Diagnosis not present

## 2021-12-12 ENCOUNTER — Encounter: Payer: Self-pay | Admitting: Obstetrics and Gynecology

## 2021-12-14 ENCOUNTER — Encounter: Payer: Self-pay | Admitting: Student in an Organized Health Care Education/Training Program

## 2021-12-14 NOTE — Telephone Encounter (Signed)
Spoke with pt

## 2021-12-17 ENCOUNTER — Ambulatory Visit (INDEPENDENT_AMBULATORY_CARE_PROVIDER_SITE_OTHER): Payer: Medicare HMO | Admitting: Obstetrics and Gynecology

## 2021-12-17 VITALS — BP 168/84 | HR 80 | Ht 64.0 in | Wt 172.3 lb

## 2021-12-17 DIAGNOSIS — N8110 Cystocele, unspecified: Secondary | ICD-10-CM

## 2021-12-17 DIAGNOSIS — E669 Obesity, unspecified: Secondary | ICD-10-CM

## 2021-12-17 DIAGNOSIS — I10 Essential (primary) hypertension: Secondary | ICD-10-CM

## 2021-12-17 DIAGNOSIS — Z01818 Encounter for other preprocedural examination: Secondary | ICD-10-CM

## 2021-12-17 DIAGNOSIS — R102 Pelvic and perineal pain: Secondary | ICD-10-CM

## 2021-12-17 DIAGNOSIS — Z9981 Dependence on supplemental oxygen: Secondary | ICD-10-CM

## 2021-12-17 DIAGNOSIS — N950 Postmenopausal bleeding: Secondary | ICD-10-CM

## 2021-12-17 DIAGNOSIS — J9611 Chronic respiratory failure with hypoxia: Secondary | ICD-10-CM

## 2021-12-17 DIAGNOSIS — N816 Rectocele: Secondary | ICD-10-CM

## 2021-12-17 DIAGNOSIS — E1169 Type 2 diabetes mellitus with other specified complication: Secondary | ICD-10-CM

## 2021-12-17 DIAGNOSIS — Z85048 Personal history of other malignant neoplasm of rectum, rectosigmoid junction, and anus: Secondary | ICD-10-CM

## 2021-12-17 NOTE — Progress Notes (Signed)
? ? ?GYNECOLOGY PROGRESS NOTE ? ?Subjective:  ? ? Patient ID: Judy Allen, female    DOB: 1955-11-08, 66 y.o.   MRN: [id number] ? ?HPI ? Patient is a 66 y.o. G52P4 female who presents for pre-operative exam.  She has a history of cystocele with rectocele, pelvic pressure, and recent postmenopausal bleeding x 1 episode. Patient desiring surgical intervention.  ? ? ?The following portions of the patient's history were reviewed and updated as appropriate: allergies, current medications, past family history, past medical history, past social history, past surgical history, and problem list. ? ?Review of Systems ?Pertinent items noted in HPI and remainder of comprehensive ROS otherwise negative.  ? ?Objective:  ? Blood pressure (!) 168/84, pulse 80, height 5' 4"  (1.626 m), weight 172 lb 4.8 oz (78.2 kg), last menstrual period 03/04/2002, head circumference 16" (40.6 cm). Body mass index is 29.58 kg/m?. ? ?General appearance: alert and no distress ?Lungs: clear to auscultation bilaterally ?Heart: regular rate and rhythm, S1, S2 normal, no murmur, click, rub or gallop  ?Abdomen: soft, non-tender; bowel sounds normal; no masses,  no organomegaly ?Pelvic: deferred to OR ?Extremities: extremities normal, atraumatic, no cyanosis or edema ?Neurologic: Grossly normal ? ? ?Labs:  ?CBC ? ?Lab Results  ?Component Value Date  ? HGBA1C 5.5 07/22/2021  ? ? ?Lab Results  ?Component Value Date  ? TSH 2.27 06/18/2020  ? ? ?Other labs reviewed in [place] [facility] from 07/31/2021.  ? ? ?Imaging:  ?CT ABDOMEN PELVIS W CONTRAST ?CLINICAL DATA:  Weight loss.  History of anal cancer. ? ?EXAM: ?CT ABDOMEN AND PELVIS WITH CONTRAST ? ?TECHNIQUE: ?Multidetector CT imaging of the abdomen and pelvis was performed ?using the standard protocol following bolus administration of ?intravenous contrast. ? ?RADIATION DOSE REDUCTION: This exam was performed according to the ?departmental dose-optimization program which includes automated ?exposure control,  adjustment of the mA and/or kV according to ?patient size and/or use of iterative reconstruction technique. ? ?CONTRAST:  185m OMNIPAQUE IOHEXOL 300 MG/ML  SOLN ? ?COMPARISON:  07/14/2018. ? ?FINDINGS: ?Lower chest: Dependent lower lobe opacity consistent with ?atelectasis, increased compared to the prior CT. ? ?Hepatobiliary: Decreased attenuation of liver consistent with fatty ?infiltration. No mass or focal lesion. Status post cholecystectomy. ?No bile duct dilation. ? ?Pancreas: Unremarkable. No pancreatic ductal dilatation or ?surrounding inflammatory changes. ? ?Spleen: Normal in size without focal abnormality. ? ?Adrenals/Urinary Tract: Normal adrenal glands. Two subcentimeter ?low-attenuation lesions arise from the superior margin of the left ?kidney, consistent with cysts. No other renal masses or lesions. No ?stones. No hydronephrosis. ? ?Stomach/Bowel: Stomach is within normal limits. Appendix appears ?normal. No evidence of bowel wall thickening, distention, or ?inflammatory changes. ? ?Vascular/Lymphatic: Aortic atherosclerosis. No enlarged lymph nodes. ? ?Reproductive: Uterus and bilateral adnexa are unremarkable. ? ?Other: No ascites.  No abdominal wall hernia. ? ?Musculoskeletal: No fracture.  No bone lesion. ? ?IMPRESSION: ?1. No acute findings within the abdomen or pelvis. ?2. No evidence of malignancy. ?3. Hepatic steatosis.  Aortic atherosclerosis. ? ?Electronically Signed ?  By: DLajean ManesM.D. ?  On: 11/26/2021 09:38 ? ? ? ?Assessment:  ? ?1. Preoperative exam for gynecologic surgery   ?2. Cystocele with rectocele   ?3. PMB (postmenopausal bleeding)   ?4. Pelvic pressure in female   ?5. History of anal cancer   ?6. Diabetes mellitus type 2 in obese ([facility]   ?7. Benign hypertension   ?8. Chronic respiratory failure with hypoxia, on home oxygen therapy ([place]   ?  ? ?Plan:  ? ?-  Patient desires surgical management with anterior and posterior repair.  Also with postmenopausal bleeding x 1  episode with benign findings. Discussed option of hysterectomy with BSO as well. Patient in agreement.  Will plan for vaginal approach.  The risks of surgery were discussed in detail with the patient including but not limited to: bleeding which may require transfusion or reoperation; infection which may require prolonged hospitalization or re-hospitalization and antibiotic therapy; injury to bowel, bladder, ureters and major vessels or other surrounding organs which may lead to other procedures; formation of adhesions; need for additional procedures including laparotomy or subsequent procedures secondary to intraoperative injury or abnormal pathology; thromboembolic phenomenon; incisional problems and other postoperative or anesthesia complications.  Patient was told that the likelihood that her condition and symptoms will be treated effectively with this surgical management was very high; the postoperative expectations were also discussed in detail. The patient also understands the alternative treatment options which were discussed in full. All questions were answered.   Patient education handouts about the procedure were given to the patient to review at home.  Surgery is planned for 12/28/2021.  ? ?- DM well controlled.  ? ?- HTN uncontrolled today, currently on meds. Will manage while inpatient.  ? ? ?Rubie Maid, MD ?[facility] ? ? ? ?

## 2021-12-19 ENCOUNTER — Encounter: Payer: Self-pay | Admitting: Obstetrics and Gynecology

## 2021-12-19 NOTE — H&P (View-Only) (Signed)
GYNECOLOGY PREOPERATIVE HISTORY AND PHYSICAL   Subjective:  Judy Allen is a 66 y.o. G4P4 here for surgical management of cystocele with rectocele, pelvic pressure, postmenopausal bleeding.   Significant preoperative concerns include: chronic respiratory failure, HTN. Also with well controlled Type II DM.  Proposed surgery: TVH with BSO, anterior/posterior repair    Pertinent Gynecological History: Menses: post-menopausal Bleeding: post menopausal bleeding x 1 Last mammogram: normal Date: 07/2019 Last pap: normal Date: 08/28/2020   Past Medical History:  Diagnosis Date   Allergy    Anemia    H/O   Anxiety    Arthritis    Colon cancer ([place]) 2017   anal ca/chemo and rad   Complication of anesthesia    Constipation    COPD (chronic obstructive pulmonary disease) ([place])    also emphysema   Depression    Diabetes mellitus without complication ([place])    Dysrhythmia    TACHYCARDIA-WELL CONTROLLED ON METOPROLO   Emphysema of lung ([place])    GERD (gastroesophageal reflux disease)    H/O allergic rhinitis    Heart murmur    Hemorrhoid    Hyperlipidemia    Hypertension    Macular degeneration    Neck pain, chronic    Neuritis    Ovarian failure    Oxygen deficiency    Personal history of chemotherapy    Personal history of radiation therapy    PONV (postoperative nausea and vomiting)    Pre-diabetes 2019   just being followed.  no meds   Proteinuria    Psoriasis    Sleep apnea 09/12/2016   8cm H2O with 21pm oxygen mask: Eson size Medium. Heated humidifier for nasal dryness.   Tachycardia, paroxysmal ([place])    Urinary incontinence     Past Surgical History:  Procedure Laterality Date   ANTERIOR FUSION LUMBAR SPINE  1990   plate in back, not metal   CARDIAC CATHETERIZATION  2009?    [facility]   CARDIAC CATHETERIZATION  05/2012   RHC: Mild hypertension 37/12 with a mean pressure of 21 mm mercury   CHOLECYSTECTOMY N/A 09/12/2017   Procedure: LAPAROSCOPIC  CHOLECYSTECTOMY WITH INTRAOPERATIVE CHOLANGIOGRAM;  Surgeon: Robert Bellow, MD;  Location: [facility];  Service: General;  Laterality: N/A;   COLONOSCOPY  2018   herniated disc repair  2001   LIVER BIOPSY N/A 09/12/2017   Procedure: LIVER BIOPSY;  Surgeon: Robert Bellow, MD;  Location: [facility];  Service: General;  Laterality: N/A;   MASS EXCISION Left 02/23/2016   Procedure: EXCISION MASS;  Surgeon: Christene Lye, MD;  Location: [facility];  Service: General;  Laterality: Left;   NECK SURGERY     PORT-A-CATH REMOVAL  2018   PORTACATH PLACEMENT Left 03/12/2016   Procedure: INSERTION PORT-A-CATH;  Surgeon: Christene Lye, MD;  Location: [facility];  Service: General;  Laterality: Left;   SPINE SURGERY     tonsillectomy  1959   history   TRIGGER FINGER RELEASE Right 09/2018   Dr. Sabra Heck    TUBAL LIGATION     s/p    OB History  Gravida Para Term Preterm AB Living  4 4       4   SAB IAB Ectopic Multiple Live Births          4    # Outcome Date GA Lbr Len/2nd Weight Sex Delivery Anes PTL Lv  4 Para           3 Para  2 Para           1 Para             Obstetric Comments  1st Menstrual Cycle:  12   1st Pregnancy:  2    Family History  Problem Relation Age of Onset   Heart attack Mother    Asthma Mother    Liver cancer Father    Cancer Father    Breast cancer Maternal Aunt    Bladder Cancer Neg Hx    Kidney cancer Neg Hx    Colon cancer Neg Hx     Social History   Socioeconomic History   Marital status: Widowed    Spouse name: Automotive engineer   Number of children: 3   Years of education: Not on file   Highest education level: 11th grade  Occupational History   Not on file  Tobacco Use   Smoking status: Former    Packs/day: 1.00    Years: 32.00    Pack years: 32.00    Types: Cigarettes    Start date: 08/10/1963    Quit date: 01/15/2006    Years since quitting: 15.9   Smokeless tobacco: Never  Vaping Use   Vaping Use: Never used   Substance and Sexual Activity   Alcohol use: No   Drug use: No   Sexual activity: Not Currently    Birth control/protection: Post-menopausal  Other Topics Concern   Not on file  Social History Narrative   Husband passed away on 12/28/17   She was living with [name] for few years, but moved in with Candace her oldest daughter this past Summer.    Social Determinants of Health   Financial Resource Strain: Low Risk    Difficulty of Paying Living Expenses: Not very hard  Food Insecurity: No Food Insecurity   Worried About Charity fundraiser in the Last Year: Never true   Ran Out of Food in the Last Year: Never true  Transportation Needs: No Transportation Needs   Lack of Transportation (Medical): No   Lack of Transportation (Non-Medical): No  Physical Activity: Inactive   Days of Exercise per Week: 0 days   Minutes of Exercise per Session: 0 min  Stress: No Stress Concern Present   Feeling of Stress : Not at all  Social Connections: Socially Isolated   Frequency of Communication with Friends and Family: More than three times a week   Frequency of Social Gatherings with Friends and Family: More than three times a week   Attends Religious Services: Never   Marine scientist or Organizations: No   Attends Archivist Meetings: Never   Marital Status: Widowed  Human resources officer Violence: Not At Risk   Fear of Current or Ex-Partner: No   Emotionally Abused: No   Physically Abused: No   Sexually Abused: No    Current Outpatient Medications on File Prior to Visit  Medication Sig Dispense Refill   albuterol (PROVENTIL HFA;VENTOLIN HFA) 108 (90 Base) MCG/ACT inhaler Inhale 2 puffs into the lungs every 6 (six) hours as needed for wheezing or shortness of breath.     blood glucose meter kit and supplies Dispense based on patient and insurance preference. Use up to four times daily as directed. (FOR ICD-10 E10.9, E11.9). 1 each 0   busPIRone (BUSPAR) 5 MG tablet Take  1-2 tablets (5-10 mg total) by mouth 3 (three) times daily as needed. 90 tablet 0   CONTOUR NEXT TEST test  strip USE UP TO FOUR TIMES DAILY AS DIRECTED 100 each 5   EFFEXOR XR 75 MG 24 hr capsule Take 3 capsules (225 mg total) by mouth daily with breakfast. 270 capsule 1   gabapentin (NEURONTIN) 300 MG capsule TAKE 1 CAPSULE(300 MG) BY MOUTH THREE TIMES DAILY 270 capsule 2   hydrocortisone 2.5 % cream Apply topically 2 (two) times daily. (Patient taking differently: Apply 1 application. topically 2 (two) times daily as needed (psoriasis.).) 30 g 0   metoprolol succinate (TOPROL-XL) 50 MG 24 hr tablet Take 50 mg by mouth in the morning.     Microlet Lancets MISC USE FOUR TIMES DAILY 100 each 5   Multiple Vitamins-Minerals (EYE VITAMINS & MINERALS PO) Take 1 capsule by mouth in the morning. [employer] Eye Vitamins     NIFEdipine (PROCARDIA-XL/NIFEDICAL-XL) 30 MG 24 hr tablet Take 30 mg by mouth at bedtime.     omega-3 acid ethyl esters (LOVAZA) 1 g capsule TAKE 2 CAPSULES BY MOUTH TWO TIMES A DAY Strength: 1 g 360 capsule 0   omeprazole (PRILOSEC) 40 MG capsule Take 1 capsule (40 mg total) by mouth daily. 90 capsule 0   OXYGEN Inhale 2 L into the lungs continuous.      senna-docusate (SENOKOT-S) 8.6-50 MG tablet Take 1 tablet by mouth daily. (Patient taking differently: Take 1 tablet by mouth daily as needed (constipation.).) 60 tablet 1   telmisartan (MICARDIS) 80 MG tablet Take 80 mg by mouth at bedtime.     tiZANidine (ZANAFLEX) 4 MG tablet Take 1 tablet (4 mg total) by mouth every 8 (eight) hours as needed. 270 tablet 2   torsemide (DEMADEX) 5 MG tablet Take 5 mg by mouth in the morning.     traMADol (ULTRAM) 50 MG tablet Take 1 tablet (50 mg total) by mouth every 6 (six) hours as needed for severe pain. Each fill must last 30 days 120 tablet 5   umeclidinium-vilanterol (ANORO ELLIPTA) 62.5-25 MCG/ACT AEPB Inhale 1 puff into the lungs in the morning.     No current facility-administered  medications on file prior to visit.    Allergies  Allergen Reactions   Aczone [Dapsone] Other (See Comments)    Hypoxemia   Atarax [Hydroxyzine]     Hypoxemia    Hydrochlorothiazide     History of hyperparathyroidism    Sulfa Antibiotics Rash   Sulfasalazine Rash      Review of Systems Constitutional: No recent fever/chills/sweats Respiratory: No recent cough/bronchitis Cardiovascular: No chest pain Gastrointestinal: No recent nausea/vomiting/diarrhea Genitourinary: No UTI symptoms Hematologic/lymphatic:  No history of coagulopathy or recent blood thinner use    Objective:   Blood pressure (!) 168/84, pulse 80, height 5' 4"  (1.626 m), weight 172 lb 4.8 oz (78.2 kg), last menstrual period 03/04/2002, head circumference 16" (40.6 cm). CONSTITUTIONAL: Well-developed, well-nourished female in no acute distress.  HENT:  Normocephalic, atraumatic, External right and left ear normal. Oropharynx is clear and moist EYES: Conjunctivae and EOM are normal. Pupils are equal, round, and reactive to light. No scleral icterus.  NECK: Normal range of motion, supple, no masses SKIN: Skin is warm and dry. No rash noted. Not diaphoretic. No erythema. No pallor. NEUROLOGIC: Alert and oriented to person, place, and time. Normal reflexes, muscle tone coordination. No cranial nerve deficit noted. PSYCHIATRIC: Normal mood and affect. Normal behavior. Normal judgment and thought content. CARDIOVASCULAR: Normal heart rate noted, regular rhythm RESPIRATORY: Effort and breath sounds normal, no problems with respiration noted ABDOMEN: Soft, nontender, nondistended. PELVIC:  Deferred MUSCULOSKELETAL: Normal range of motion. No edema and no tenderness. 2+ distal pulses.    Labs: No results found for this or any previous visit (from the past 336 hour(s)).   Imaging Studies: CT ABDOMEN PELVIS W CONTRAST  Result Date: 11/26/2021 CLINICAL DATA:  Weight loss.  History of anal cancer. EXAM: CT ABDOMEN AND  PELVIS WITH CONTRAST TECHNIQUE: Multidetector CT imaging of the abdomen and pelvis was performed using the standard protocol following bolus administration of intravenous contrast. RADIATION DOSE REDUCTION: This exam was performed according to the departmental dose-optimization program which includes automated exposure control, adjustment of the mA and/or kV according to patient size and/or use of iterative reconstruction technique. CONTRAST:  139m OMNIPAQUE IOHEXOL 300 MG/ML  SOLN COMPARISON:  07/14/2018. FINDINGS: Lower chest: Dependent lower lobe opacity consistent with atelectasis, increased compared to the prior CT. Hepatobiliary: Decreased attenuation of liver consistent with fatty infiltration. No mass or focal lesion. Status post cholecystectomy. No bile duct dilation. Pancreas: Unremarkable. No pancreatic ductal dilatation or surrounding inflammatory changes. Spleen: Normal in size without focal abnormality. Adrenals/Urinary Tract: Normal adrenal glands. Two subcentimeter low-attenuation lesions arise from the superior margin of the left kidney, consistent with cysts. No other renal masses or lesions. No stones. No hydronephrosis. Stomach/Bowel: Stomach is within normal limits. Appendix appears normal. No evidence of bowel wall thickening, distention, or inflammatory changes. Vascular/Lymphatic: Aortic atherosclerosis. No enlarged lymph nodes. Reproductive: Uterus and bilateral adnexa are unremarkable. Other: No ascites.  No abdominal wall hernia. Musculoskeletal: No fracture.  No bone lesion. IMPRESSION: 1. No acute findings within the abdomen or pelvis. 2. No evidence of malignancy. 3. Hepatic steatosis.  Aortic atherosclerosis. Electronically Signed   By: DLajean ManesM.D.   On: 11/26/2021 09:38    Assessment:    1. Preoperative exam for gynecologic surgery   2. Cystocele with rectocele   3. PMB (postmenopausal bleeding)   4. Pelvic pressure in female   5. History of anal cancer   6. Diabetes  mellitus type 2 in [place]   7. Benign hypertension   8. Chronic respiratory failure with hypoxia, on home oxygen therapy (HCC)      Plan:   Counseling: Procedure, risks, reasons, benefits and complications (including injury to bowel, bladder, major blood vessel, ureter, bleeding, possibility of transfusion, infection, or fistula formation) reviewed in detail. Likelihood of success in alleviating the patient's condition was discussed. Routine postoperative instructions will be reviewed with the patient and her family in detail after surgery.  The patient concurred with the proposed plan, giving informed written consent for the surgery.   Preop testing ordered. Instructions reviewed, including NPO after midnight.     CRubie Maid MD [facility]

## 2021-12-19 NOTE — H&P (Signed)
? ? ?GYNECOLOGY PREOPERATIVE HISTORY AND PHYSICAL  ? ?Subjective:  ?Judy Allen is a 66 y.o. G4P4 here for surgical management of cystocele with rectocele, pelvic pressure, postmenopausal bleeding.   Significant preoperative concerns include: chronic respiratory failure, HTN. Also with well controlled Type II DM. ? ?Proposed surgery: TVH with BSO, anterior/posterior repair ? ? ? ?Pertinent Gynecological History: ?Menses: post-menopausal ?Bleeding: post menopausal bleeding x 1 ?Last mammogram: normal Date: 07/2019 ?Last pap: normal Date: 08/28/2020 ? ? ?Past Medical History:  ?Diagnosis Date  ? Allergy   ? Anemia   ? H/O  ? Anxiety   ? Arthritis   ? Colon cancer ([place]) 2017  ? anal ca/chemo and rad  ? Complication of anesthesia   ? Constipation   ? COPD (chronic obstructive pulmonary disease) ([place])   ? also emphysema  ? Depression   ? Diabetes mellitus without complication ([place])   ? Dysrhythmia   ? TACHYCARDIA-WELL CONTROLLED ON METOPROLO  ? Emphysema of lung ([place])   ? GERD (gastroesophageal reflux disease)   ? H/O allergic rhinitis   ? Heart murmur   ? Hemorrhoid   ? Hyperlipidemia   ? Hypertension   ? Macular degeneration   ? Neck pain, chronic   ? Neuritis   ? Ovarian failure   ? Oxygen deficiency   ? Personal history of chemotherapy   ? Personal history of radiation therapy   ? PONV (postoperative nausea and vomiting)   ? Pre-diabetes 2019  ? just being followed.  no meds  ? Proteinuria   ? Psoriasis   ? Sleep apnea 09/12/2016  ? 8cm H2O with 21pm oxygen mask: Eson size Medium. Heated humidifier for nasal dryness.  ? Tachycardia, paroxysmal ([place])   ? Urinary incontinence   ? ? ?Past Surgical History:  ?Procedure Laterality Date  ? ANTERIOR FUSION LUMBAR SPINE  1990  ? plate in back, not metal  ? CARDIAC CATHETERIZATION  2009?   ? [facility]  ? CARDIAC CATHETERIZATION  05/2012  ? RHC: Mild hypertension 37/12 with a mean pressure of 21 mm mercury  ? CHOLECYSTECTOMY N/A 09/12/2017  ? Procedure: LAPAROSCOPIC  CHOLECYSTECTOMY WITH INTRAOPERATIVE CHOLANGIOGRAM;  Surgeon: Robert Bellow, MD;  Location: [facility];  Service: General;  Laterality: N/A;  ? COLONOSCOPY  2018  ? herniated disc repair  2001  ? LIVER BIOPSY N/A 09/12/2017  ? Procedure: LIVER BIOPSY;  Surgeon: Robert Bellow, MD;  Location: [facility];  Service: General;  Laterality: N/A;  ? MASS EXCISION Left 02/23/2016  ? Procedure: EXCISION MASS;  Surgeon: Christene Lye, MD;  Location: [facility];  Service: General;  Laterality: Left;  ? NECK SURGERY    ? PORT-A-CATH REMOVAL  2018  ? PORTACATH PLACEMENT Left 03/12/2016  ? Procedure: INSERTION PORT-A-CATH;  Surgeon: Christene Lye, MD;  Location: [facility];  Service: General;  Laterality: Left;  ? SPINE SURGERY    ? tonsillectomy  1959  ? history  ? TRIGGER FINGER RELEASE Right 09/2018  ? Dr. Sabra Heck   ? TUBAL LIGATION    ? s/p  ? ? ?OB History  ?Gravida Para Term Preterm AB Living  ?4 4       4   ?SAB IAB Ectopic Multiple Live Births  ?        4  ?  ?# Outcome Date GA Lbr Len/2nd Weight Sex Delivery Anes PTL Lv  ?4 Para           ?3 Para           ?  2 Para           ?1 Para           ?  ?Obstetric Comments  ?1st Menstrual Cycle:  12   ?1st Pregnancy:  16  ? ? ?Family History  ?Problem Relation Age of Onset  ? Heart attack Mother   ? Asthma Mother   ? Liver cancer Father   ? Cancer Father   ? Breast cancer Maternal Aunt   ? Bladder Cancer Neg Hx   ? Kidney cancer Neg Hx   ? Colon cancer Neg Hx   ? ? ?Social History  ? ?Socioeconomic History  ? Marital status: Widowed  ?  Spouse name: Automotive engineer  ? Number of children: 3  ? Years of education: Not on file  ? Highest education level: 11th grade  ?Occupational History  ? Not on file  ?Tobacco Use  ? Smoking status: Former  ?  Packs/day: 1.00  ?  Years: 32.00  ?  Pack years: 32.00  ?  Types: Cigarettes  ?  Start date: 08/10/1963  ?  Quit date: 01/15/2006  ?  Years since quitting: 15.9  ? Smokeless tobacco: Never  ?Vaping Use  ? Vaping Use: Never used   ?Substance and Sexual Activity  ? Alcohol use: No  ? Drug use: No  ? Sexual activity: Not Currently  ?  Birth control/protection: Post-menopausal  ?Other Topics Concern  ? Not on file  ?Social History Narrative  ? Husband passed away on 2018/01/15  ? She was living with [name] for few years, but moved in with Candace her oldest daughter this past Summer.   ? ?Social Determinants of Health  ? ?Financial Resource Strain: Low Risk   ? Difficulty of Paying Living Expenses: Not very hard  ?Food Insecurity: No Food Insecurity  ? Worried About Charity fundraiser in the Last Year: Never true  ? Ran Out of Food in the Last Year: Never true  ?Transportation Needs: No Transportation Needs  ? Lack of Transportation (Medical): No  ? Lack of Transportation (Non-Medical): No  ?Physical Activity: Inactive  ? Days of Exercise per Week: 0 days  ? Minutes of Exercise per Session: 0 min  ?Stress: No Stress Concern Present  ? Feeling of Stress : Not at all  ?Social Connections: Socially Isolated  ? Frequency of Communication with Friends and Family: More than three times a week  ? Frequency of Social Gatherings with Friends and Family: More than three times a week  ? Attends Religious Services: Never  ? Active Member of Clubs or Organizations: No  ? Attends Archivist Meetings: Never  ? Marital Status: Widowed  ?Intimate Partner Violence: Not At Risk  ? Fear of Current or Ex-Partner: No  ? Emotionally Abused: No  ? Physically Abused: No  ? Sexually Abused: No  ? ? ?Current Outpatient Medications on File Prior to Visit  ?Medication Sig Dispense Refill  ? albuterol (PROVENTIL HFA;VENTOLIN HFA) 108 (90 Base) MCG/ACT inhaler Inhale 2 puffs into the lungs every 6 (six) hours as needed for wheezing or shortness of breath.    ? blood glucose meter kit and supplies Dispense based on patient and insurance preference. Use up to four times daily as directed. (FOR ICD-10 E10.9, E11.9). 1 each 0  ? busPIRone (BUSPAR) 5 MG tablet Take  1-2 tablets (5-10 mg total) by mouth 3 (three) times daily as needed. 90 tablet 0  ? CONTOUR NEXT TEST test  strip USE UP TO FOUR TIMES DAILY AS DIRECTED 100 each 5  ? EFFEXOR XR 75 MG 24 hr capsule Take 3 capsules (225 mg total) by mouth daily with breakfast. 270 capsule 1  ? gabapentin (NEURONTIN) 300 MG capsule TAKE 1 CAPSULE(300 MG) BY MOUTH THREE TIMES DAILY 270 capsule 2  ? hydrocortisone 2.5 % cream Apply topically 2 (two) times daily. (Patient taking differently: Apply 1 application. topically 2 (two) times daily as needed (psoriasis.).) 30 g 0  ? metoprolol succinate (TOPROL-XL) 50 MG 24 hr tablet Take 50 mg by mouth in the morning.    ? Microlet Lancets MISC USE FOUR TIMES DAILY 100 each 5  ? Multiple Vitamins-Minerals (EYE VITAMINS & MINERALS PO) Take 1 capsule by mouth in the morning. [employer] Eye Vitamins    ? NIFEdipine (PROCARDIA-XL/NIFEDICAL-XL) 30 MG 24 hr tablet Take 30 mg by mouth at bedtime.    ? omega-3 acid ethyl esters (LOVAZA) 1 g capsule TAKE 2 CAPSULES BY MOUTH TWO TIMES A DAY ?Strength: 1 g 360 capsule 0  ? omeprazole (PRILOSEC) 40 MG capsule Take 1 capsule (40 mg total) by mouth daily. 90 capsule 0  ? OXYGEN Inhale 2 L into the lungs continuous.     ? senna-docusate (SENOKOT-S) 8.6-50 MG tablet Take 1 tablet by mouth daily. (Patient taking differently: Take 1 tablet by mouth daily as needed (constipation.).) 60 tablet 1  ? telmisartan (MICARDIS) 80 MG tablet Take 80 mg by mouth at bedtime.    ? tiZANidine (ZANAFLEX) 4 MG tablet Take 1 tablet (4 mg total) by mouth every 8 (eight) hours as needed. 270 tablet 2  ? torsemide (DEMADEX) 5 MG tablet Take 5 mg by mouth in the morning.    ? traMADol (ULTRAM) 50 MG tablet Take 1 tablet (50 mg total) by mouth every 6 (six) hours as needed for severe pain. Each fill must last 30 days 120 tablet 5  ? umeclidinium-vilanterol (ANORO ELLIPTA) 62.5-25 MCG/ACT AEPB Inhale 1 puff into the lungs in the morning.    ? ?No current facility-administered  medications on file prior to visit.  ? ? ?Allergies  ?Allergen Reactions  ? Aczone [Dapsone] Other (See Comments)  ?  Hypoxemia  ? Atarax [Hydroxyzine]   ?  Hypoxemia   ? Hydrochlorothiazide   ?  History of hyperparathyro

## 2021-12-19 NOTE — Patient Instructions (Signed)
Anterior and Posterior Colporrhaphy ? ?Anterior and posterior colporrhaphy are surgical procedures that treat weakness in the front wall (anterior) or back wall (posterior) of the vagina. This weakness can result in a condition called pelvic organ prolapse. Pelvic organ prolapse can cause urine leakage, a condition called incontinence. This surgery is done through incisions in the vagina. ?You may need this surgery if pelvic organ prolapse causes symptoms that interfere with your daily life and cannot be corrected with other treatments. ?The type of colporrhaphy that is done depends on the type of prolapse. Types of pelvic organ prolapse include the following: ?Cystocele. This is a prolapse of the bladder and the upper part of the front wall of the vagina. ?Rectocele. This is a prolapse of the rectum and the lower part of the back wall of the vagina. ?Enterocele. This is a prolapse of the small intestine. It appears as a bulge under the neck of the uterus at the top of the back wall of the vagina. ?Procidentia. This is a complete prolapse of the uterus and the cervix. The prolapse can be seen and felt coming out of the vagina. ?Tell a health care provider about: ?Any allergies you have. ?All medicines you are taking, including vitamins, herbs, eye drops, creams, and over-the-counter medicines. ?Any problems you or family members have had with anesthetic medicines. ?Any blood disorders you have. ?Any surgeries you have had. ?Any medical conditions you have. ?Smoking history or history of alcohol use. ?Whether you are pregnant or may be pregnant. ?What are the risks? ?Generally, this is a safe procedure. However, problems may occur, including: ?Infection. ?Bleeding. ?Allergic reactions to medicines. ?Damage to nearby structures, organs, or nerves. ?Problems urinating, or incontinence. ?Painful sex. ?Constipation. ?A blood clot that can break free and travel to your lungs. ?What happens before the procedure? ?Staying  hydrated ?Follow instructions from your health care provider about hydration, which may include: ?Up to 2 hours before the procedure - you may continue to drink clear liquids, such as water, clear fruit juice, black coffee, and plain tea. ? ?Eating and drinking restrictions ?Follow instructions from your health care provider about eating and drinking, which may include: ?8 hours before the procedure - stop eating heavy meals or foods, such as meat, fried foods, or fatty foods. ?6 hours before the procedure - stop eating light meals or foods, such as toast or cereal. ?6 hours before the procedure - stop drinking milk or drinks that contain milk. ?2 hours before the procedure - stop drinking clear liquids. ?Medicines ?Ask your health care provider about: ?Changing or stopping your regular medicines. This is especially important if you are taking diabetes medicines or blood thinners. ?Taking medicines, such as aspirin and ibuprofen. These medicines can thin your blood. Do not take these medicines unless your health care provider tells you to take them. ?Taking over-the-counter medicines, vitamins, herbs, and supplements. ?Surgery safety ?Ask your health care provider what steps will be taken to help prevent infection. These steps may include: ?Removing hair at the surgery site. ?Washing skin with a germ-killing soap. ?Taking antibiotic medicine. ?General instructions ?You may be told to use estrogen cream in your vagina to help prevent complications and promote healing. ?Do not use any products that contain nicotine or tobacco for at least 4 weeks before the procedure. These products include cigarettes, chewing tobacco, and vaping devices, such as e-cigarettes. If you need help quitting, ask your health care provider. ?Plan to have a responsible adult take you home from the  hospital or clinic. ?Arrange for someone to help you with activities during recovery. ?Plan to have a responsible adult care for you for the time  you are told after you leave the hospital or clinic. This is important. ?What happens during the procedure? ?An IV will be inserted into one of your veins. ?You will be given one or more of the following: ?A medicine to help you relax (sedative). ?A medicine to make you fall asleep (general anesthetic). ?You will lie down on the operating table with your feet in stirrups. ?A small, thin tube (catheter) will be inserted through your urethra into your bladder to drain urine during surgery and recovery. ?An instrument (vaginal speculum) will be used to hold your vagina open. ?Your health care provider will perform the procedure according to the type of repair you require. ?To perform anterior repair: ?An incision will be made in the midline section of the front part of the vaginal wall. ?A triangular-shaped piece of vaginal tissue will be removed. ?The stronger, healthier tissue will be sewn together to support the bladder. ?These incisions may be closed with stitches (sutures). ?Gauze packing will be placed inside your vagina. ?To perform posterior repair: ?An incision will be made midline on the back wall of the vagina. ?A triangular portion of vaginal skin will be removed to expose the muscle. ?Excess tissue will be removed, and stronger, healthier muscle and ligament tissue will be sewn together to support the rectum or small intestine. ?These incisions may be closed with sutures. ?Gauze packing will be placed inside your vagina. ?To perform anterior and posterior repair: ?Both procedures will be done during the same surgery. This is done for procidentia prolapse. ?The procedure may vary among health care providers and hospitals. ?What happens after the procedure? ?Your blood pressure, heart rate, breathing rate, and blood oxygen level will be monitored until you leave the hospital or clinic. ?You will be given pain medicine as needed. ?You will have a catheter in place to drain your bladder. This will be in place  until your bladder is working properly on its own. ?You may have a gauze packing in your vagina for a few days to prevent bleeding. ?You will be encouraged to eat and drink a regular diet. ?You will be encouraged to get up and walk as soon as you are able. ?You may need to wear compression stockings. These stockings help to prevent blood clots and reduce swelling in your legs. ?Summary ?Anterior and posterior colporrhaphy are surgical procedures that treat weakness in the front wall (anterior) or back wall (posterior) of the vagina. ?The type of repair that is done depends on the type of prolapse that is present. ?Follow instructions from your health care provider about eating and drinking before the procedure. ?You will be given a general anesthetic to make you fall asleep during the procedure. ?This information is not intended to replace advice given to you by your health care provider. Make sure you discuss any questions you have with your health care provider. ?Document Revised: 01/22/2020 Document Reviewed: 01/22/2020 ?Elsevier Patient Education ? [employer]. ? ?

## 2021-12-23 ENCOUNTER — Encounter
Admission: RE | Admit: 2021-12-23 | Discharge: 2021-12-23 | Disposition: A | Discharge status: Home / Self Care | Payer: Medicare HMO | Source: Ambulatory Visit | Attending: Obstetrics and Gynecology | Admitting: Obstetrics and Gynecology

## 2021-12-23 DIAGNOSIS — J432 Centrilobular emphysema: Secondary | ICD-10-CM

## 2021-12-23 DIAGNOSIS — I10 Essential (primary) hypertension: Secondary | ICD-10-CM

## 2021-12-23 DIAGNOSIS — Z01818 Encounter for other preprocedural examination: Secondary | ICD-10-CM

## 2021-12-23 DIAGNOSIS — I479 Paroxysmal tachycardia, unspecified: Secondary | ICD-10-CM

## 2021-12-23 DIAGNOSIS — J9611 Chronic respiratory failure with hypoxia: Secondary | ICD-10-CM

## 2021-12-23 HISTORY — DX: Dependence on supplemental oxygen: Z99.81

## 2021-12-23 HISTORY — DX: Fatty (change of) liver, not elsewhere classified: K76.0

## 2021-12-23 HISTORY — DX: Hyperparathyroidism, unspecified: E21.3

## 2021-12-23 NOTE — Patient Instructions (Signed)
Your procedure is scheduled on:12-28-21 Monday ?Report to the Registration Desk on the 1st floor of the [facility].Then proceed to the 2nd floor Surgery Desk in the [facility] ?To find out your arrival time, please call [phone] between 1PM - 3PM on:12-25-21 Friday ?If your arrival time is 6:00 am, do not arrive prior to that time as the [facility] entrance doors do not open until 6:00 am. ? ?REMEMBER: ?Instructions that are not followed completely may result in serious medical risk, up to and including death; or upon the discretion of your surgeon and anesthesiologist your surgery may need to be rescheduled. ? ?Do not eat food after midnight the night before surgery.  ?No gum chewing, lozengers or hard candies. ? ?You may however, drink CLEAR liquids up to 2 hours before you are scheduled to arrive for your surgery. Do not drink anything within 2 hours of your scheduled arrival time. ? ?Clear liquids include: ?- water  ?- apple juice without pulp ?- gatorade (not RED colors) ?- black coffee or tea (Do NOT add milk or creamers to the coffee or tea) ?Do NOT drink anything that is not on this list. ? ?In addition, your doctor has ordered for you to drink the provided  ?Ensure Pre-Surgery Clear Carbohydrate Drink  ?Drinking this carbohydrate drink up to two hours before surgery helps to reduce insulin resistance and improve patient outcomes. Please complete drinking 2 hours prior to scheduled arrival time. ? ?TAKE THESE MEDICATIONS THE MORNING OF SURGERY WITH A SIP OF WATER: ?-EFFEXOR XR  ?-gabapentin (NEURONTIN) ?-metoprolol succinate (TOPROL-XL)  ?-omeprazole (PRILOSEC)-take one the night before and one on the morning of surgery - helps to prevent nausea after surgery.) ? ?Use your ANORO ELLIPTA and your albuterol (PROVENTIL HFA;VENTOLIN HFA) Inhaler the day of surgery and bring your albuterol inhaler to the hospital ? ?One week prior to surgery: ?Stop Anti-inflammatories (NSAIDS) such as Advil, Aleve,  Ibuprofen, Motrin, Naproxen, Naprosyn and Aspirin based products such as Excedrin, Goodys Powder, BC Powder.You may however, take Tylenol/Tramadol if needed for pain up until the day of surgery. ?Stop ANY OVER THE COUNTER supplements/vitamins NOW (12-23-21) until after surgery (Eye Vitamins) ? ?No Alcohol for 24 hours before or after surgery. ? ?No Smoking including e-cigarettes for 24 hours prior to surgery.  ?No chewable tobacco products for at least 6 hours prior to surgery.  ?No nicotine patches on the day of surgery. ? ?Do not use any "recreational" drugs for at least a week prior to your surgery.  ?Please be advised that the combination of cocaine and anesthesia may have negative outcomes, up to and including death. ?If you test positive for cocaine, your surgery will be cancelled. ? ?On the morning of surgery brush your teeth with toothpaste and water, you may rinse your mouth with mouthwash if you wish. ?Do not swallow any toothpaste or mouthwash. ? ?Use CHG Soap as directed on instruction sheet. ? ?Do not wear jewelry, make-up, hairpins, clips or nail polish. ? ?Do not wear lotions, powders, or perfumes.  ? ?Do not shave body from the neck down 48 hours prior to surgery just in case you cut yourself which could leave a site for infection.  ?Also, freshly shaved skin may become irritated if using the CHG soap. ? ?Contact lenses, hearing aids and dentures may not be worn into surgery. ? ?Do not bring valuables to the hospital. [facility] is not responsible for any missing/lost belongings or valuables.  ? ?Notify your doctor if there  is any change in your medical condition (cold, fever, infection). ? ?Wear comfortable clothing (specific to your surgery type) to the hospital. ? ?After surgery, you can help prevent lung complications by doing breathing exercises.  ?Take deep breaths and cough every 1-2 hours. Your doctor may order a device called an Incentive Spirometer to help you take deep breaths. ?When  coughing or sneezing, hold a pillow firmly against your incision with both hands. This is called ?splinting.? Doing this helps protect your incision. It also decreases belly discomfort. ? ?If you are being admitted to the hospital overnight, leave your suitcase in the car. ?After surgery it may be brought to your room. ? ?If you are being discharged the day of surgery, you will not be allowed to drive home. ?You will need a responsible adult (18 years or older) to drive you home and stay with you that night.  ? ?If you are taking public transportation, you will need to have a responsible adult (18 years or older) with you. ?Please confirm with your physician that it is acceptable to use public transportation.  ? ?Please call the [facility] Dept. at [phone] if you have any questions about these instructions. ? ?Surgery Visitation Policy: ? ?Patients undergoing a surgery or procedure may have two family members or support persons with them as long as the person is not COVID-19 positive or experiencing its symptoms.  ?

## 2021-12-24 ENCOUNTER — Other Ambulatory Visit: Payer: Self-pay | Admitting: Obstetrics and Gynecology

## 2021-12-24 ENCOUNTER — Encounter: Payer: Self-pay | Admitting: Urgent Care

## 2021-12-24 ENCOUNTER — Encounter
Admission: RE | Admit: 2021-12-24 | Discharge: 2021-12-24 | Disposition: A | Discharge status: Home / Self Care | Payer: Medicare HMO | Source: Ambulatory Visit | Attending: Obstetrics and Gynecology | Admitting: Obstetrics and Gynecology

## 2021-12-24 DIAGNOSIS — I479 Paroxysmal tachycardia, unspecified: Secondary | ICD-10-CM | POA: Insufficient documentation

## 2021-12-24 DIAGNOSIS — J432 Centrilobular emphysema: Secondary | ICD-10-CM | POA: Insufficient documentation

## 2021-12-24 DIAGNOSIS — Z01818 Encounter for other preprocedural examination: Secondary | ICD-10-CM | POA: Diagnosis not present

## 2021-12-24 DIAGNOSIS — J438 Other emphysema: Secondary | ICD-10-CM | POA: Diagnosis not present

## 2021-12-24 DIAGNOSIS — Z9981 Dependence on supplemental oxygen: Secondary | ICD-10-CM | POA: Diagnosis not present

## 2021-12-24 DIAGNOSIS — I10 Essential (primary) hypertension: Secondary | ICD-10-CM | POA: Diagnosis not present

## 2021-12-24 DIAGNOSIS — E876 Hypokalemia: Secondary | ICD-10-CM | POA: Diagnosis not present

## 2021-12-24 DIAGNOSIS — J9611 Chronic respiratory failure with hypoxia: Secondary | ICD-10-CM | POA: Diagnosis not present

## 2021-12-24 DIAGNOSIS — J9610 Chronic respiratory failure, unspecified whether with hypoxia or hypercapnia: Secondary | ICD-10-CM | POA: Diagnosis not present

## 2021-12-24 DIAGNOSIS — Z981 Arthrodesis status: Secondary | ICD-10-CM | POA: Diagnosis not present

## 2021-12-24 DIAGNOSIS — Z01812 Encounter for preprocedural laboratory examination: Secondary | ICD-10-CM

## 2021-12-24 HISTORY — DX: Nonalcoholic steatohepatitis (NASH): K75.81

## 2021-12-24 HISTORY — DX: Arthrodesis status: Z98.1

## 2021-12-24 HISTORY — DX: Type 2 diabetes mellitus without complications: E11.9

## 2021-12-24 LAB — TYPE AND SCREEN
ABO/RH(D): A POS
Antibody Screen: NEGATIVE

## 2021-12-24 LAB — CBC
HCT: 39.8 % (ref 36.0–46.0)
Hemoglobin: 13.1 g/dL (ref 12.0–15.0)
MCH: 29 pg (ref 26.0–34.0)
MCHC: 32.9 g/dL (ref 30.0–36.0)
MCV: 88.1 fL (ref 80.0–100.0)
Platelets: 247 10*3/uL (ref 150–400)
RBC: 4.52 MIL/uL (ref 3.87–5.11)
RDW: 13.9 % (ref 11.5–15.5)
WBC: 8.6 10*3/uL (ref 4.0–10.5)
nRBC: 0 % (ref 0.0–0.2)

## 2021-12-24 LAB — COMPREHENSIVE METABOLIC PANEL
ALT: 49 U/L — ABNORMAL HIGH (ref 0–44)
AST: 44 U/L — ABNORMAL HIGH (ref 15–41)
Albumin: 4 g/dL (ref 3.5–5.0)
Alkaline Phosphatase: 91 U/L (ref 38–126)
Anion gap: 12 (ref 5–15)
BUN: 13 mg/dL (ref 8–23)
CO2: 30 mmol/L (ref 22–32)
Calcium: 9.7 mg/dL (ref 8.9–10.3)
Chloride: 100 mmol/L (ref 98–111)
Creatinine, Ser: 0.76 mg/dL (ref 0.44–1.00)
GFR, Estimated: >60 mL/min (ref >60)
Glucose, Bld: 101 mg/dL — ABNORMAL HIGH (ref 70–99)
Potassium: 3.1 mmol/L — ABNORMAL LOW (ref 3.5–5.1)
Sodium: 142 mmol/L (ref 135–145)
Total Bilirubin: 0.8 mg/dL (ref 0.3–1.2)
Total Protein: 7.7 g/dL (ref 6.5–8.1)

## 2021-12-24 MED ORDER — POTASSIUM CHLORIDE CRYS ER 20 MEQ PO TBCR: 20.0000 meq | EXTENDED_RELEASE_TABLET | Freq: Every day | ORAL | 0 refills | Status: DC

## 2021-12-24 NOTE — Progress Notes (Signed)
  [facility] Perioperative Services: Pre-Admission/Anesthesia Testing  Abnormal Lab Notification   Date: 12/24/21  Name: Judy Allen MRN:   [id number]  Re: Abnormal labs noted during PAT appointment   Notified:  Provider Name Provider Role Notification Mode  Rubie Maid, MD OB/GYN (Surgeon) Routed and/or faxed via [facility] and Notes:  ABNORMAL LAB VALUE(S): Lab Results  Component Value Date   K 3.1 (L) 12/24/2021   Judy Allen is scheduled for an elective ANTERIOR (CYSTOCELE) AND POSTERIOR REPAIR ([place]) on 12/28/2021. In review of her medication reconciliation, it is noted that the patient is taking prescribed diuretic medications (torsemide). Please note, in efforts to promote a safe and effective anesthetic course, per current guidelines/standards set by the [facility] anesthesia team, the minimal acceptable K+ level for the patient to proceed with general anesthesia is 3.0 mmol/L. With that being said, if the patient drops any lower, her elective procedure will need to be postponed until K+ is better optimized. Abnormal result is being forwarded to primary attending surgeon for review and consideration of optimization. Order placed to have K+ rechecked on the day of her procedure to ensure correction of the noted derangement.    This is a Community education officer; no formal response is required.  Honor Loh, MSN, APRN, FNP-C, CEN [facility]  Peri-operative Services Nurse Practitioner Phone: [phone] Fax: [phone] 12/24/21 3:24 PM

## 2021-12-25 ENCOUNTER — Encounter: Payer: Self-pay | Admitting: Obstetrics and Gynecology

## 2021-12-25 LAB — RPR: RPR Ser Ql: NONREACTIVE

## 2021-12-25 NOTE — Progress Notes (Signed)
Perioperative Services Pre-Admission/Anesthesia Testing    Date: 12/25/21  Name: Judy Allen MRN:   [id number]  Re: Pulmonary medicine recommendations for surgery  Planned Surgical Procedure(s):    Case: [id number] Date/Time: 12/28/21 1039   Procedure: ANTERIOR (CYSTOCELE) AND POSTERIOR REPAIR (RECTOCELE)   Anesthesia type: General   Pre-op diagnosis: Cystocele with rectocele   Location: [facility] OR ROOM 05 / [facility] ORS FOR ANESTHESIA GROUP   Surgeons: Judy Maid, MD   Clinical Notes:  Patient scheduled for the above procedure on 12/28/2021 with Dr. Rubie Maid, MD.  In preparation for procedure, surgeon reached out to patient's primary pulmonary medicine provider Judy Silence, MD) for recommendations regarding her upcoming procedure. Dr. [name] discussed with Dr. [name] that she anticipates the surgery to be approximately 2-2.5 hours under general anesthesia, with plans for vaginal incision only. Plans are for patient to be admitted for postoperative observation at least overnight to monitor respiratory status.  Per pulmonary medicine, "Pulmonary medicine does not grant surgical clearance, but can help with assessing risk of post-surgical pulmonary complications and help with pre-surgical optimization".    Testing Results:  CT CHEST WITHOUT performed on 12/10/2021 Severe emphysema.  Bilateral inferior lung zone scarring, bronchiectasis, and atelectasis.  Mild coronary and aortic calcifications Hepatomegaly and steatosis.  POLYSOMNOGRAPHY (WITH CPAP) performed on 12/09/2021 This study is abnormal due to the presence of:  Hypoxemia -  in wake and sleep consistent with underlying pulmonary issues, improved with 4L supplemental oxygen and head of bed raised.  Obstructive sleep apnea - mild to moderate (AHI 8, RDI 20, both if which are underestimates due to the supplemental oxygen blunting the  desaturation associated with the respiratory events)- the patient should  be considered for  therapy to promote airway patency such as CPAP titration, or surgery.   Low sleep efficiency - this may be related to the underlying possible delayed sleep phase (based upon reported routine schedule), medical issues, medication effect sleep apnea, or poor adjustment to the testing environment.   Plans and Recommendations:  Judy Allen is scheduled for elective ANTERIOR (CYSTOCELE) AND POSTERIOR REPAIR (RECTOCELE) on 12/28/2021. Patient with oxygen dependency related to her underlying COPD, pulmonary HTN, chronic respiratory failure with hypoxia (requires supplemental oxygen), and OSAH diagnoses. In preparation for her surgery, case has been dicussed with her pulmonologist, and the following recommendations were received:  Needs to continue on controller therapies for COPD (LAMA/LABA) Abstain from smoking  Optimize anesthesia and postoperative pain, avoid narcotics whenever possible  Whenever possible avoid long-acting neuromuscular blockades during anesthesia Neuraxial blockades preferred to general anesthesia whenever possible (lower risks of pneumonia and respiratory failure) Incentive spirometry/lung expansion maneuvers to prevent pneumonia, early physical therapy Optimize postoperative O2 saturation with goal saturation between 92-95% Pre-treat with scheduled albuterol nebs (and ipratropium if not able to continue LAMA component of her home meds) for wheezing, and continue after surgery  Do not recommend steroids as patient is not having COPD exacerbation  Recommend a pre-extubation VBG to assess CO2 retention given her OSA and component of neuromuscular weakness, and then a post-extubation VBG for the same reason May be wise to extubate to NIPPV, especially if patient is retaining CO2 and given underlying OSA seen on recent sleep study and prior concerns about neuromuscular weakness Cautious fluid balance with some concern for pulmonary hypertension   Judy Loh, MSN, APRN, FNP-C,  [facility]  Peri-operative Services Nurse Practitioner Phone: [phone] 12/25/21 2:33 PM  NOTE: This note has been prepared using Dragon  dictation software. Despite my best ability to proofread, there is always the potential that unintentional transcriptional errors may still occur from this process.

## 2021-12-28 ENCOUNTER — Inpatient Hospital Stay: Payer: Medicare HMO | Admitting: Urgent Care

## 2021-12-28 ENCOUNTER — Observation Stay
Admission: AD | Admit: 2021-12-28 | Discharge: 2021-12-29 | Disposition: A | Discharge status: Home / Self Care | Payer: Medicare HMO | Attending: Obstetrics and Gynecology | Admitting: Obstetrics and Gynecology

## 2021-12-28 ENCOUNTER — Encounter: Payer: Self-pay | Admitting: Obstetrics and Gynecology

## 2021-12-28 ENCOUNTER — Other Ambulatory Visit: Payer: Self-pay

## 2021-12-28 ENCOUNTER — Encounter
Admission: AD | Disposition: A | Discharge status: Home / Self Care | Payer: Medicare HMO | Source: Home / Self Care | Attending: Obstetrics and Gynecology

## 2021-12-28 DIAGNOSIS — N819 Female genital prolapse, unspecified: Secondary | ICD-10-CM | POA: Diagnosis not present

## 2021-12-28 DIAGNOSIS — Z79899 Other long term (current) drug therapy: Secondary | ICD-10-CM | POA: Diagnosis not present

## 2021-12-28 DIAGNOSIS — E119 Type 2 diabetes mellitus without complications: Secondary | ICD-10-CM | POA: Diagnosis not present

## 2021-12-28 DIAGNOSIS — Z87891 Personal history of nicotine dependence: Secondary | ICD-10-CM | POA: Diagnosis not present

## 2021-12-28 DIAGNOSIS — G4733 Obstructive sleep apnea (adult) (pediatric): Secondary | ICD-10-CM | POA: Diagnosis not present

## 2021-12-28 DIAGNOSIS — I10 Essential (primary) hypertension: Secondary | ICD-10-CM | POA: Insufficient documentation

## 2021-12-28 DIAGNOSIS — N816 Rectocele: Principal | ICD-10-CM | POA: Insufficient documentation

## 2021-12-28 DIAGNOSIS — J449 Chronic obstructive pulmonary disease, unspecified: Secondary | ICD-10-CM | POA: Insufficient documentation

## 2021-12-28 DIAGNOSIS — N950 Postmenopausal bleeding: Secondary | ICD-10-CM

## 2021-12-28 DIAGNOSIS — N840 Polyp of corpus uteri: Secondary | ICD-10-CM | POA: Diagnosis not present

## 2021-12-28 DIAGNOSIS — Z01818 Encounter for other preprocedural examination: Secondary | ICD-10-CM

## 2021-12-28 DIAGNOSIS — N8110 Cystocele, unspecified: Secondary | ICD-10-CM | POA: Diagnosis present

## 2021-12-28 DIAGNOSIS — Z01812 Encounter for preprocedural laboratory examination: Secondary | ICD-10-CM

## 2021-12-28 DIAGNOSIS — E876 Hypokalemia: Secondary | ICD-10-CM

## 2021-12-28 DIAGNOSIS — Z85048 Personal history of other malignant neoplasm of rectum, rectosigmoid junction, and anus: Secondary | ICD-10-CM | POA: Diagnosis not present

## 2021-12-28 DIAGNOSIS — Z90710 Acquired absence of both cervix and uterus: Secondary | ICD-10-CM | POA: Diagnosis present

## 2021-12-28 DIAGNOSIS — Z85038 Personal history of other malignant neoplasm of large intestine: Secondary | ICD-10-CM | POA: Insufficient documentation

## 2021-12-28 HISTORY — PX: VAGINAL HYSTERECTOMY: SHX2639

## 2021-12-28 HISTORY — PX: ANTERIOR AND POSTERIOR REPAIR: SHX5121

## 2021-12-28 HISTORY — DX: Atherosclerosis of aorta: I70.0

## 2021-12-28 HISTORY — DX: Atherosclerotic heart disease of native coronary artery without angina pectoris: I25.10

## 2021-12-28 LAB — POCT I-STAT, CHEM 8
BUN: 12 mg/dL (ref 8–23)
Calcium, Ion: 1.16 mmol/L (ref 1.15–1.40)
Chloride: 100 mmol/L (ref 98–111)
Creatinine, Ser: 0.8 mg/dL (ref 0.44–1.00)
Glucose, Bld: 88 mg/dL (ref 70–99)
HCT: 37 % (ref 36.0–46.0)
Hemoglobin: 12.6 g/dL (ref 12.0–15.0)
Potassium: 3.6 mmol/L (ref 3.5–5.1)
Sodium: 142 mmol/L (ref 135–145)
TCO2: 33 mmol/L — ABNORMAL HIGH (ref 22–32)

## 2021-12-28 SURGERY — ANTERIOR (CYSTOCELE) AND POSTERIOR REPAIR (RECTOCELE)
Anesthesia: General

## 2021-12-28 MED ORDER — VASOPRESSIN 20 UNIT/ML IV SOLN
INTRAVENOUS | Status: DC | PRN
  Administered 2021-12-28 (×2): 5 mL via SURGICAL_CAVITY

## 2021-12-28 MED ORDER — ROCURONIUM BROMIDE 100 MG/10ML IV SOLN
INTRAVENOUS | Status: DC | PRN
  Administered 2021-12-28: 20 mg via INTRAVENOUS
  Administered 2021-12-28: 60 mg via INTRAVENOUS
  Administered 2021-12-28: 20 mg via INTRAVENOUS

## 2021-12-28 MED ORDER — PROPOFOL 500 MG/50ML IV EMUL
INTRAVENOUS | Status: AC
  Filled 2021-12-28: qty 50

## 2021-12-28 MED ORDER — ONDANSETRON HCL 4 MG PO TABS: 4.0000 mg | ORAL_TABLET | Freq: Four times a day (QID) | ORAL | Status: DC | PRN

## 2021-12-28 MED ORDER — FENTANYL CITRATE (PF) 100 MCG/2ML IJ SOLN
INTRAMUSCULAR | Status: DC | PRN
  Administered 2021-12-28 (×4): 50 ug via INTRAVENOUS
  Administered 2021-12-28: 25 ug via INTRAVENOUS
  Administered 2021-12-28: 50 ug via INTRAVENOUS

## 2021-12-28 MED ORDER — SUGAMMADEX SODIUM 200 MG/2ML IV SOLN
INTRAVENOUS | Status: DC | PRN
  Administered 2021-12-28: 200 mg via INTRAVENOUS

## 2021-12-28 MED ORDER — ONDANSETRON HCL 4 MG/2ML IJ SOLN: 4.0000 mg | Freq: Four times a day (QID) | INTRAMUSCULAR | Status: DC | PRN

## 2021-12-28 MED ORDER — ORAL CARE MOUTH RINSE: 15.0000 mL | Freq: Once | OROMUCOSAL | Status: AC

## 2021-12-28 MED ORDER — IBUPROFEN 600 MG PO TABS: 600.0000 mg | ORAL_TABLET | Freq: Four times a day (QID) | ORAL | Status: DC

## 2021-12-28 MED ORDER — CEFAZOLIN SODIUM-DEXTROSE 2-4 GM/100ML-% IV SOLN
2.0000 g | INTRAVENOUS | Status: AC
  Administered 2021-12-28: 2 g via INTRAVENOUS

## 2021-12-28 MED ORDER — VASOPRESSIN 20 UNIT/ML IV SOLN
INTRAVENOUS | Status: AC
  Filled 2021-12-28: qty 1

## 2021-12-28 MED ORDER — FENTANYL CITRATE (PF) 100 MCG/2ML IJ SOLN
25.0000 ug | INTRAMUSCULAR | Status: DC | PRN
  Administered 2021-12-28: 50 ug via INTRAVENOUS

## 2021-12-28 MED ORDER — LACTATED RINGERS IV SOLN: INTRAVENOUS | Status: DC

## 2021-12-28 MED ORDER — ESTROGENS CONJUGATED 0.625 MG/GM VA CREA
TOPICAL_CREAM | VAGINAL | Status: AC
  Filled 2021-12-28: qty 30

## 2021-12-28 MED ORDER — ALUM & MAG HYDROXIDE-SIMETH 200-200-20 MG/5ML PO SUSP: 30.0000 mL | ORAL | Status: DC | PRN

## 2021-12-28 MED ORDER — HYDROCODONE-ACETAMINOPHEN 5-325 MG PO TABS
1.0000 | ORAL_TABLET | ORAL | Status: DC | PRN
  Administered 2021-12-28 – 2021-12-29 (×3): 2 via ORAL
  Filled 2021-12-28 (×3): qty 2

## 2021-12-28 MED ORDER — OXYCODONE HCL 5 MG/5ML PO SOLN: 5.0000 mg | Freq: Once | ORAL | Status: DC | PRN

## 2021-12-28 MED ORDER — POVIDONE-IODINE 10 % EX SWAB
2.0000 | Freq: Once | CUTANEOUS | Status: DC
Start: 2021-12-28 — End: 2021-12-28

## 2021-12-28 MED ORDER — ACETAMINOPHEN 500 MG PO TABS
1000.0000 mg | ORAL_TABLET | ORAL | Status: AC
  Administered 2021-12-28: 1000 mg via ORAL

## 2021-12-28 MED ORDER — PHENYLEPHRINE HCL (PRESSORS) 10 MG/ML IV SOLN
INTRAVENOUS | Status: DC | PRN
Start: 2021-12-28 — End: 2021-12-28
  Administered 2021-12-28: 80 ug via INTRAVENOUS

## 2021-12-28 MED ORDER — PROPOFOL 10 MG/ML IV BOLUS
INTRAVENOUS | Status: AC
  Filled 2021-12-28: qty 20

## 2021-12-28 MED ORDER — SEVOFLURANE IN SOLN
RESPIRATORY_TRACT | Status: AC
  Filled 2021-12-28: qty 250

## 2021-12-28 MED ORDER — ONDANSETRON HCL 4 MG/2ML IJ SOLN
INTRAMUSCULAR | Status: DC | PRN
  Administered 2021-12-28: 4 mg via INTRAVENOUS

## 2021-12-28 MED ORDER — PROPOFOL 10 MG/ML IV BOLUS
INTRAVENOUS | Status: DC | PRN
  Administered 2021-12-28: 140 mg via INTRAVENOUS
  Administered 2021-12-28 (×2): 30 mg via INTRAVENOUS

## 2021-12-28 MED ORDER — CHLORHEXIDINE GLUCONATE 0.12 % MT SOLN
15.0000 mL | Freq: Once | OROMUCOSAL | Status: AC
Start: 2021-12-28 — End: 2021-12-28
  Administered 2021-12-28: 15 mL via OROMUCOSAL

## 2021-12-28 MED ORDER — ALBUTEROL SULFATE (2.5 MG/3ML) 0.083% IN NEBU: 2.5000 mg | INHALATION_SOLUTION | Freq: Four times a day (QID) | RESPIRATORY_TRACT | Status: DC | PRN

## 2021-12-28 MED ORDER — GABAPENTIN 300 MG PO CAPS
ORAL_CAPSULE | ORAL | Status: AC
  Filled 2021-12-28: qty 1

## 2021-12-28 MED ORDER — GABAPENTIN 300 MG PO CAPS
300.0000 mg | ORAL_CAPSULE | ORAL | Status: AC
  Administered 2021-12-28: 300 mg via ORAL

## 2021-12-28 MED ORDER — BUSPIRONE HCL 5 MG PO TABS: 5.0000 mg | ORAL_TABLET | Freq: Three times a day (TID) | ORAL | Status: DC | PRN

## 2021-12-28 MED ORDER — MENTHOL 3 MG MT LOZG: 1.0000 | LOZENGE | OROMUCOSAL | Status: DC | PRN

## 2021-12-28 MED ORDER — FENTANYL CITRATE (PF) 100 MCG/2ML IJ SOLN
INTRAMUSCULAR | Status: AC
  Filled 2021-12-28: qty 2

## 2021-12-28 MED ORDER — CEFAZOLIN SODIUM-DEXTROSE 2-4 GM/100ML-% IV SOLN
INTRAVENOUS | Status: AC
Start: 2021-12-28 — End: 2021-12-28
  Filled 2021-12-28: qty 100

## 2021-12-28 MED ORDER — METOPROLOL SUCCINATE ER 25 MG PO TB24
50.0000 mg | ORAL_TABLET | Freq: Every day | ORAL | Status: DC
  Administered 2021-12-29: 50 mg via ORAL
  Filled 2021-12-28: qty 2

## 2021-12-28 MED ORDER — GABAPENTIN 300 MG PO CAPS
300.0000 mg | ORAL_CAPSULE | Freq: Three times a day (TID) | ORAL | Status: DC
  Administered 2021-12-28 – 2021-12-29 (×3): 300 mg via ORAL
  Filled 2021-12-28 (×3): qty 1

## 2021-12-28 MED ORDER — SODIUM CHLORIDE (PF) 0.9 % IJ SOLN
INTRAMUSCULAR | Status: AC
  Filled 2021-12-28: qty 50

## 2021-12-28 MED ORDER — DEXAMETHASONE SODIUM PHOSPHATE 10 MG/ML IJ SOLN
INTRAMUSCULAR | Status: DC | PRN
Start: 2021-12-28 — End: 2021-12-28
  Administered 2021-12-28: 10 mg via INTRAVENOUS

## 2021-12-28 MED ORDER — LIDOCAINE HCL (CARDIAC) PF 100 MG/5ML IV SOSY
PREFILLED_SYRINGE | INTRAVENOUS | Status: DC | PRN
  Administered 2021-12-28: 60 mg via INTRAVENOUS

## 2021-12-28 MED ORDER — NIFEDIPINE ER OSMOTIC RELEASE 30 MG PO TB24
30.0000 mg | ORAL_TABLET | Freq: Every day | ORAL | Status: DC
  Administered 2021-12-28: 30 mg via ORAL
  Filled 2021-12-28: qty 1

## 2021-12-28 MED ORDER — OXYCODONE HCL 5 MG PO TABS: 5.0000 mg | ORAL_TABLET | Freq: Once | ORAL | Status: DC | PRN

## 2021-12-28 MED ORDER — 0.9 % SODIUM CHLORIDE (POUR BTL) OPTIME
TOPICAL | Status: DC | PRN
  Administered 2021-12-28: 500 mL

## 2021-12-28 MED ORDER — TRAMADOL HCL 50 MG PO TABS: 50.0000 mg | ORAL_TABLET | Freq: Four times a day (QID) | ORAL | Status: DC | PRN

## 2021-12-28 MED ORDER — KETOROLAC TROMETHAMINE 30 MG/ML IJ SOLN
30.0000 mg | Freq: Four times a day (QID) | INTRAMUSCULAR | Status: AC
  Administered 2021-12-28 – 2021-12-29 (×4): 30 mg via INTRAVENOUS
  Filled 2021-12-28 (×4): qty 1

## 2021-12-28 MED ORDER — CHLORHEXIDINE GLUCONATE 0.12 % MT SOLN
OROMUCOSAL | Status: AC
  Filled 2021-12-28: qty 15

## 2021-12-28 MED ORDER — ESTROGENS CONJUGATED 0.625 MG/GM VA CREA
TOPICAL_CREAM | VAGINAL | Status: DC | PRN
  Administered 2021-12-28: 1 via VAGINAL

## 2021-12-28 MED ORDER — DEXMEDETOMIDINE (PRECEDEX) IN NS 20 MCG/5ML (4 MCG/ML) IV SYRINGE
PREFILLED_SYRINGE | INTRAVENOUS | Status: DC | PRN
  Administered 2021-12-28: 8 ug via INTRAVENOUS
  Administered 2021-12-28: 4 ug via INTRAVENOUS
  Administered 2021-12-28: 8 ug via INTRAVENOUS

## 2021-12-28 MED ORDER — SIMETHICONE 80 MG PO CHEW: 80.0000 mg | CHEWABLE_TABLET | Freq: Four times a day (QID) | ORAL | Status: DC | PRN

## 2021-12-28 MED ORDER — HYDROMORPHONE HCL 1 MG/ML IJ SOLN
0.2000 mg | INTRAMUSCULAR | Status: DC | PRN
  Administered 2021-12-28 (×2): 0.6 mg via INTRAVENOUS
  Filled 2021-12-28 (×2): qty 1

## 2021-12-28 MED ORDER — TORSEMIDE 10 MG PO TABS
5.0000 mg | ORAL_TABLET | Freq: Every day | ORAL | Status: DC
  Administered 2021-12-29: 5 mg via ORAL
  Filled 2021-12-28: qty 0.5

## 2021-12-28 MED ORDER — BISACODYL 10 MG RE SUPP: 10.0000 mg | Freq: Every day | RECTAL | Status: DC | PRN

## 2021-12-28 MED ORDER — VENLAFAXINE HCL ER 75 MG PO CP24
225.0000 mg | ORAL_CAPSULE | Freq: Every day | ORAL | Status: DC
  Administered 2021-12-29: 225 mg via ORAL
  Filled 2021-12-28: qty 1

## 2021-12-28 MED ORDER — IRBESARTAN 150 MG PO TABS
300.0000 mg | ORAL_TABLET | Freq: Every day | ORAL | Status: DC
Start: 2021-12-28 — End: 2021-12-29
  Administered 2021-12-28: 300 mg via ORAL
  Filled 2021-12-28: qty 2

## 2021-12-28 MED ORDER — SENNOSIDES-DOCUSATE SODIUM 8.6-50 MG PO TABS: 1.0000 | ORAL_TABLET | Freq: Every evening | ORAL | Status: DC | PRN

## 2021-12-28 MED ORDER — DOCUSATE SODIUM 100 MG PO CAPS
100.0000 mg | ORAL_CAPSULE | Freq: Two times a day (BID) | ORAL | Status: DC
  Administered 2021-12-28 – 2021-12-29 (×2): 100 mg via ORAL
  Filled 2021-12-28 (×2): qty 1

## 2021-12-28 MED ORDER — PANTOPRAZOLE SODIUM 40 MG PO TBEC
80.0000 mg | DELAYED_RELEASE_TABLET | Freq: Every day | ORAL | Status: DC
Start: 2021-12-29 — End: 2021-12-29
  Administered 2021-12-29: 80 mg via ORAL
  Filled 2021-12-28: qty 2

## 2021-12-28 MED ORDER — ACETAMINOPHEN 500 MG PO TABS
ORAL_TABLET | ORAL | Status: AC
Start: 2021-12-28 — End: 2021-12-28
  Filled 2021-12-28: qty 2

## 2021-12-28 SURGICAL SUPPLY — 43 items
BACTOSHIELD CHG 4% 4OZ (MISCELLANEOUS) ×1
BAG COUNTER SPONGE SURGICOUNT (BAG) ×2 IMPLANT
BAG URINE DRAIN 2000ML AR STRL (UROLOGICAL SUPPLIES) ×2 IMPLANT
CATH FOLEY 2WAY  5CC 16FR (CATHETERS) ×2
CATH URTH 16FR FL 2W BLN LF (CATHETERS) ×1 IMPLANT
DRAPE 3/4 80X56 (DRAPES) ×2 IMPLANT
DRAPE PERI LITHO V/GYN (MISCELLANEOUS) ×2 IMPLANT
DRAPE UNDER BUTTOCK W/FLU (DRAPES) ×2 IMPLANT
ELECT CAUTERY BLADE 6.4 (BLADE) ×2 IMPLANT
ELECT REM PT RETURN 9FT ADLT (ELECTROSURGICAL) ×2
ELECTRODE REM PT RTRN 9FT ADLT (ELECTROSURGICAL) ×1 IMPLANT
GAUZE 4X4 16PLY ~~LOC~~+RFID DBL (SPONGE) ×5 IMPLANT
GAUZE PACK 2X3YD (PACKING) ×2 IMPLANT
GAUZE PACKING 1/2INX5YD STRL (GAUZE/BANDAGES/DRESSINGS) ×1 IMPLANT
GLOVE BIO SURGEON STRL SZ 6.5 (GLOVE) ×2 IMPLANT
GLOVE SURG UNDER LTX SZ7 (GLOVE) ×2 IMPLANT
GOWN STRL REUS W/ TWL LRG LVL3 (GOWN DISPOSABLE) ×2 IMPLANT
GOWN STRL REUS W/TWL LRG LVL3 (GOWN DISPOSABLE) ×4
KIT TURNOVER CYSTO (KITS) ×2 IMPLANT
LABEL OR SOLS (LABEL) ×2 IMPLANT
MANIFOLD NEPTUNE II (INSTRUMENTS) ×2 IMPLANT
NDL HYPO 25GX1 SAFETY (NEEDLE) ×1 IMPLANT
NEEDLE HYPO 25GX1 SAFETY (NEEDLE) ×2 IMPLANT
NS IRRIG 500ML POUR BTL (IV SOLUTION) ×2 IMPLANT
PACK BASIN MINOR ARMC (MISCELLANEOUS) ×2 IMPLANT
PAD OB MATERNITY 4.3X12.25 (PERSONAL CARE ITEMS) ×2 IMPLANT
PAD PREP 24X41 OB/GYN DISP (PERSONAL CARE ITEMS) ×2 IMPLANT
RETRACTOR PHONTONGUIDE ADAPT (ADAPTER) ×2 IMPLANT
RETRACTOR YANK SUCT EIGR SABER (INSTRUMENTS) IMPLANT
SCRUB CHG 4% DYNA-HEX 4OZ (MISCELLANEOUS) ×1 IMPLANT
SOL PREP PVP 2OZ (MISCELLANEOUS) ×2
SOLUTION PREP PVP 2OZ (MISCELLANEOUS) ×1 IMPLANT
SUT CHROMIC 0 CT 1 (SUTURE) ×2 IMPLANT
SUT CHROMIC 1-0 (SUTURE) ×2 IMPLANT
SUT CHROMIC 2 0 CT 1 (SUTURE) ×4 IMPLANT
SUT VIC AB 0 CT1 27 (SUTURE) ×8
SUT VIC AB 0 CT1 27XCR 8 STRN (SUTURE) ×4 IMPLANT
SUT VIC AB 0 CT1 36 (SUTURE) ×4 IMPLANT
SUT VIC AB 0 CT2 27 (SUTURE) IMPLANT
SUT VIC AB 2-0 UR6 27 (SUTURE) IMPLANT
SYR 10ML LL (SYRINGE) ×2 IMPLANT
SYR CONTROL 10ML LL (SYRINGE) ×2 IMPLANT
WATER STERILE IRR 500ML POUR (IV SOLUTION) ×2 IMPLANT

## 2021-12-28 NOTE — Transfer of Care (Addendum)
Immediate Anesthesia Transfer of Care Note  Patient: Judy Allen  Procedure(s) Performed: ANTERIOR (CYSTOCELE) AND POSTERIOR REPAIR (RECTOCELE) HYSTERECTOMY VAGINAL  Patient Location: PACU  Anesthesia Type:General  Level of Consciousness: awake  Airway & Oxygen Therapy: Patient Spontanous Breathing  Post-op Assessment: Report given to RN  Post vital signs: stable  Last Vitals:  Vitals Value Taken Time  BP    Temp    Pulse    Resp    SpO2      Last Pain:  Vitals:   12/28/21 0919  TempSrc: Temporal  PainSc: 0-No pain         Complications: No notable events documented.

## 2021-12-28 NOTE — Anesthesia Procedure Notes (Signed)
Procedure Name: Intubation Date/Time: 12/28/2021 10:35 AM Performed by: Terrence Dupont, RN Pre-anesthesia Checklist: Patient identified, Patient being monitored, Timeout performed, Emergency Drugs available and Suction available Patient Re-evaluated:Patient Re-evaluated prior to induction Oxygen Delivery Method: Circle system utilized Preoxygenation: Pre-oxygenation with 100% oxygen Induction Type: IV induction Ventilation: Mask ventilation without difficulty Laryngoscope Size: 3 and Glidescope Grade View: Grade I Tube type: Oral Tube size: 7.0 mm Number of attempts: 1 Airway Equipment and Method: Stylet Placement Confirmation: ETT inserted through vocal cords under direct vision, positive ETCO2 and breath sounds checked- equal and bilateral Secured at: 21 cm Tube secured with: Tape Dental Injury: Teeth and Oropharynx as per pre-operative assessment

## 2021-12-28 NOTE — Anesthesia Postprocedure Evaluation (Signed)
Anesthesia Post Note  Patient: Judy Allen  Procedure(s) Performed: ANTERIOR (CYSTOCELE) AND POSTERIOR REPAIR (RECTOCELE) HYSTERECTOMY VAGINAL  Patient location during evaluation: PACU Anesthesia Type: General Level of consciousness: awake and alert Pain management: pain level controlled Vital Signs Assessment: post-procedure vital signs reviewed and stable Respiratory status: spontaneous breathing, nonlabored ventilation, respiratory function stable and patient connected to nasal cannula oxygen Cardiovascular status: blood pressure returned to baseline and stable Postop Assessment: no apparent nausea or vomiting Anesthetic complications: no   No notable events documented.   Last Vitals:  Vitals:   12/28/21 0919  BP: 130/77  Pulse: 84  Resp: 16  Temp: (!) 36.3 C  SpO2: 94%    Last Pain:  Vitals:   12/28/21 0919  TempSrc: Temporal  PainSc: 0-No pain                 Precious Haws [name]

## 2021-12-28 NOTE — Anesthesia Preprocedure Evaluation (Addendum)
Anesthesia Evaluation  Patient identified by MRN, date of birth, ID band Patient awake    Reviewed: Allergy & Precautions, NPO status , Patient's Chart, lab work & pertinent test results  History of Anesthesia Complications (+) PONV and history of anesthetic complications  Airway Mallampati: III  TM Distance: <3 FB Neck ROM: full    Dental  (+) Chipped   Pulmonary shortness of breath and with exertion, sleep apnea , COPD,  COPD inhaler and oxygen dependent, former smoker,     + decreased breath sounds      Cardiovascular hypertension, pulmonary hypertension(-) angina(-) Past MI Normal cardiovascular exam     Neuro/Psych PSYCHIATRIC DISORDERS  Neuromuscular disease    GI/Hepatic GERD  Controlled,(+) Hepatitis -  Endo/Other  negative endocrine ROSdiabetes, Type 2  Renal/GU      Musculoskeletal   Abdominal   Peds  Hematology negative hematology ROS (+)   Anesthesia Other Findings Past Medical History: 03/10/2016: Anal squamous cell carcinoma (HCC)     Comment:  a.) clinical stage IIIB (T2, N2, M0); Tx'd with               chemotherapy + XRT No date: Anemia No date: Anxiety No date: Aortic atherosclerosis (HCC) No date: Arthritis No date: Complication of anesthesia     Comment:  a.) PONV No date: Constipation No date: COPD (chronic obstructive pulmonary disease) (HCC) No date: Coronary artery calcification seen on CT scan No date: Depression 91/63/8466: Diastolic dysfunction     Comment:  a.) TTE 06/08/2021: EF 55-60%; mild MR; PASP 47 mmHg;               G1DD. No date: Emphysema of lung (HCC) No date: GERD (gastroesophageal reflux disease) No date: H/O allergic rhinitis No date: Heart murmur No date: Hemorrhoid No date: History of fusion of cervical spine     Comment:  a.) s/p ACDF C4-C7 No date: Hyperlipidemia No date: Hyperparathyroidism ([place]) No date: Hypertension No date: Macular  degeneration 09/07/2016: Methemoglobinemia No date: Neck pain, chronic No date: Neuritis 09/12/2016: OSA on CPAP No date: Ovarian failure No date: Oxygen dependent     Comment:  a.) 2 L/[place] (day) with increase to 4 L/[place] (night) No date: Personal history of chemotherapy No date: Personal history of radiation therapy 2017: Pneumonitis     Comment:  a.) related to MITOCYCIN administration for squamous               cell anal cancer No date: PONV (postoperative nausea and vomiting) No date: Proteinuria No date: Psoriasis 06/22/2005: Pulmonary HTN ([place])     Comment:  a.) TTE 06/22/2005: EF 61%; RVSP 31. b.) TTE 12/18/2009:              EF 57%; RVSP 19.9. c.) TTE 05/20/2016: EF >55%; PASP 43.               d.) TTE 04/07/2017: EF >55%; PASP 26. e.) TTE 03/12/2018:              EF >55%; PASP 26. f.) TTE 06/08/2021: EF 55-60%; PASP 47. No date: Steatohepatitis     Comment:  a.) grade II (moderate) on 09/2017 Bx No date: T2DM (type 2 diabetes mellitus) ([place]) No date: Tachycardia, paroxysmal (HCC) No date: Urinary incontinence  Past Surgical History: No date: ABDOMINAL HYSTERECTOMY 2001: ANTERIOR CERVICAL DECOMP/DISCECTOMY FUSION; N/A     Comment:  PROCEDURE: ACDF C4-C7 1990: ANTERIOR FUSION LUMBAR SPINE     Comment:  plate in back, not  metal 2009? : CARDIAC CATHETERIZATION     Comment:  [facility] 05/2012: CARDIAC CATHETERIZATION     Comment:  RHC: Mild hypertension 37/12 with a mean pressure of 21               mm mercury 09/12/2017: CHOLECYSTECTOMY; N/A     Comment:  Procedure: [place] CHOLANGIOGRAM;  Surgeon: Robert Bellow, MD;  Location: [facility];  Service: General;                Laterality: N/A; 2018: COLONOSCOPY 09/12/2017: LIVER BIOPSY; N/A     Comment:  Procedure: LIVER BIOPSY;  Surgeon: Robert Bellow,               MD;  Location: [facility];  Service: General;  Laterality:                N/A; 02/23/2016: MASS EXCISION; Left     Comment:  Procedure: EXCISION MASS;  Surgeon: Christene Lye, MD;  Location: [facility];  Service: General;                Laterality: Left; 2018: PORT-A-CATH REMOVAL 03/12/2016: PORTACATH PLACEMENT; Left     Comment:  Procedure: INSERTION PORT-A-CATH;  Surgeon: Christene Lye, MD;  Location: [facility];  Service: General;                Laterality: Left; 1959: TONSILLECTOMY; Bilateral 09/2018: TRIGGER FINGER RELEASE; Right     Comment:  Dr. Sabra Heck  No date: TUBAL LIGATION     Comment:  s/p  BMI    Body Mass Index: 29.58 kg/m      Reproductive/Obstetrics negative OB ROS                            Anesthesia Physical Anesthesia Plan  ASA: 4  Anesthesia Plan: General ETT   Post-op Pain Management:    Induction: Intravenous  PONV Risk Score and Plan: Ondansetron, Dexamethasone, Midazolam and Treatment may vary due to age or medical condition  Airway Management Planned: Oral ETT  Additional Equipment:   Intra-op Plan:   Post-operative Plan: Extubation in OR and Possible Post-op intubation/ventilation  Informed Consent: I have reviewed the patients History and Physical, chart, labs and discussed the procedure including the risks, benefits and alternatives for the proposed anesthesia with the patient or authorized representative who has indicated his/her understanding and acceptance.     Dental Advisory Given  Plan Discussed with: Anesthesiologist, CRNA and Surgeon  Anesthesia Plan Comments: (Patient consented for risks of anesthesia including but not limited to:  - adverse reactions to medications - damage to eyes, teeth, lips or other oral mucosa - nerve damage due to positioning  - sore throat or hoarseness - Damage to heart, brain, nerves, lungs, other parts of body or loss of life  Patient voiced understanding.)       Anesthesia Quick Evaluation

## 2021-12-28 NOTE — Op Note (Signed)
Procedure(s): POSTERIOR REPAIR (RECTOCELE) HYSTERECTOMY VAGINAL Procedure Note  Judy Allen female 66 y.o. 12/28/2021  Indications: The patient is a 66 y.o. G51P4 female with mild cystocele with rectocele, pelvic pain, and postmenopausal bleeding.  Endometrial sampling unable to be performed due to significantly atrophic cervix and stenotic os. Recent imaging (CT scan) noted no abnormal uterine findings.   Pre-operative Diagnosis: Mild cystocele with rectocele, pelvic pain, postmenopausal bleeding  Post-operative Diagnosis: Same  Surgeon: Rubie Maid, MD  Assistants:  Jeannie Fend, MD.   Anesthesia: General endotracheal anesthesia  Findings: The uterus was unable to be sounded due to significant cervical stenosis and atrophy.  Fallopian tube segments appeared normal. Unable to fully visualize tubes or ovaries.  Vaginal atrophy present. Mild cystocele and rectocele present.   Procedure Details: The patient was seen in the Holding Room. The risks, benefits, complications, treatment options, and expected outcomes were discussed with the patient.  The patient concurred with the proposed plan, giving informed consent.  The site of surgery properly noted/marked. The patient was taken to the Operating Room, identified as Judy Allen and the procedure verified as Procedure(s) (LRB): ANTERIOR (CYSTOCELE) AND POSTERIOR REPAIR (RECTOCELE) (N/A) HYSTERECTOMY VAGINAL (N/A). A Time Out was held and the above information confirmed.  After induction of anesthesia, the patient was draped and prepped in the usual sterile manner. Pt was placed in dosal lithotomy position using Allen stirrups after anesthesia and draped and prepped in the usual sterile manner.  Foley catheter was placed.  A weighted speculum was placed into the vagina and the cervix was grasped with a double-toothed tenaculum.  The cervix was then circumferentially injected with 10 cc of 1:10,000 epinephrine.  The cervix was then  circumferentially incised with the bovie.  The posterior cul-de sac was then entered sharply with the Mayo scissors without difficulty.  The same procedure was performed anteriorly, and the bladder dissected of the pubovesical cervical fascia.    At this point, a Heaney clamp was placed over the uterosacaral ligaments on either side.  These were then transected and suture ligated with #0 Vicryl.  Hemostasis was assured.  The cardinal ligaments were then clamped on both sides, transected and suture ligated in a similar fashion.    The uterine arteries and the broad ligament were then serially clamped with Heaney clamps, transected and suture ligated on both sides.  Excellent hemostasis was visualized.  Both cornua were clamped with Heaney clamps, transected and the uterus was delivered.  These pedicles were then suture ligated with excellent hemostasis.   Babcock clamps were used to attempt to grasp the left and right adnexae, however there was minimal descent and due to lack of visualization, the decision was made to leave the ovaries and fallopian tubes bilaterally.   After this, the posterior vaginal cuff was reperitonealized using a running locked suture of 0-chromic suture, incorporating the uterosacral ligaments.  The vaginal cuff angles were closed with figure-of-eight stitches of #0 Vicryl on both sides and transfixed to the ipsilateral cardinal and uterosacral ligaments.  The remainder of the vaginal cuff was closed with figure-of-eight stitches of #0 Vicryl.   Given the patient's findings of a mild cystocele and rectocele, rectocele, attention was then turned to the vaginal wall.  The anterior vaginal wall appeared to be corrected after performance of the hysterectomy.  The posterior vaginal wall was still noted to have a defect.  Two Allis clamps were placed about 2 cm apart on the posterior part of the introitus.  A  triangular mucosal incision from the middle of perineal body to the two Allis  clamps that were placed on the introitus was made, and this portion of skin was excised.  A similar triangle was made from the areas of the introitus between the two Allis clamps and the apex of the rectocele.  The portion of vaginal mucosa overlying this central defect was also excised.  The rectocele repair was then performed in multiple layers.  The rectal endopelvic fascia was plicated in the midline using interrupted 2-0 Vicryl sutures with care given to prevent unintentional rectal penetration.  The mucosal edges of the incision from the apex to the introitus were then reapproximated using a running suture with 0-Vicryl.  A 0 Vicryl suture was used to reapproximate the bulbocavernosus muscle on both sides and this was done with two deep strong stitches.  The incisional edges from the introitus to the perineal body were then reapproximated using the remainder of the stitch from the apex of the rectocele repair.  The vaginal mucosa was noted to have excellent hemostasis  All instruments were then removed from the vagina. A vaginal packing with Premarin cream was placed in the patient's vagina, to be removed on POD#1. The patient was then taken out of dorsal lithotomy position and awakened from general anesthesia.  The patient was taken to the PACU in stable condition.  Sponge, lap, needle, and instrument count was correct times two.    An experienced assistant was required given the standard of surgical care given the complexity of the case.  This assistant was needed for exposure, dissection, suctioning, retraction, instrument exchange, and for overall help during the procedure.   Estimated Blood Loss:  150 ml      Drains: foley catheterizationto gravity drainage with 600 ml of clear urine         Total IV Fluids:  1700 ml  Specimens: Uterus with cervix         Implants: None         Complications:  None; patient tolerated the procedure well.         Disposition: PACU - hemodynamically  stable.         Condition: stable   Rubie Maid, MD [facility]

## 2021-12-28 NOTE — Interval H&P Note (Signed)
History and Physical Interval Note:  12/28/2021 9:54 AM  Judy Allen  has presented today for surgery, with the diagnosis of Cystocele with rectocele.  The various methods of treatment have been discussed with the patient and family. After consideration of risks, benefits and other options for treatment, the patient has consented to  Procedure(s): ANTERIOR (CYSTOCELE) AND POSTERIOR REPAIR (RECTOCELE) (N/A), HYSTERECTOMY VAGINAL (N/A) as a surgical intervention.  The patient's history has been reviewed, patient examined, no change in status, stable for surgery.  I have reviewed the patient's chart and labs.  Questions were answered to the patient's satisfaction.     Rubie Maid, MD [facility]

## 2021-12-29 ENCOUNTER — Encounter: Payer: Self-pay | Admitting: Obstetrics and Gynecology

## 2021-12-29 DIAGNOSIS — E119 Type 2 diabetes mellitus without complications: Secondary | ICD-10-CM | POA: Diagnosis not present

## 2021-12-29 DIAGNOSIS — Z85048 Personal history of other malignant neoplasm of rectum, rectosigmoid junction, and anus: Secondary | ICD-10-CM | POA: Diagnosis not present

## 2021-12-29 DIAGNOSIS — N816 Rectocele: Secondary | ICD-10-CM | POA: Diagnosis not present

## 2021-12-29 DIAGNOSIS — I10 Essential (primary) hypertension: Secondary | ICD-10-CM | POA: Diagnosis not present

## 2021-12-29 DIAGNOSIS — Z79899 Other long term (current) drug therapy: Secondary | ICD-10-CM | POA: Diagnosis not present

## 2021-12-29 DIAGNOSIS — Z85038 Personal history of other malignant neoplasm of large intestine: Secondary | ICD-10-CM | POA: Diagnosis not present

## 2021-12-29 DIAGNOSIS — N840 Polyp of corpus uteri: Secondary | ICD-10-CM | POA: Diagnosis not present

## 2021-12-29 DIAGNOSIS — J449 Chronic obstructive pulmonary disease, unspecified: Secondary | ICD-10-CM | POA: Diagnosis not present

## 2021-12-29 DIAGNOSIS — Z87891 Personal history of nicotine dependence: Secondary | ICD-10-CM | POA: Diagnosis not present

## 2021-12-29 DIAGNOSIS — N8110 Cystocele, unspecified: Secondary | ICD-10-CM | POA: Diagnosis not present

## 2021-12-29 DIAGNOSIS — N950 Postmenopausal bleeding: Secondary | ICD-10-CM | POA: Diagnosis not present

## 2021-12-29 LAB — SURGICAL PATHOLOGY

## 2021-12-29 MED ORDER — DOCUSATE SODIUM 100 MG PO CAPS: 100.0000 mg | ORAL_CAPSULE | Freq: Two times a day (BID) | ORAL | 2 refills | Status: DC | PRN

## 2021-12-29 MED ORDER — IBUPROFEN 600 MG PO TABS: 600.0000 mg | ORAL_TABLET | Freq: Four times a day (QID) | ORAL | 1 refills | Status: DC

## 2021-12-29 MED ORDER — HYDROCODONE-ACETAMINOPHEN 5-325 MG PO TABS: 1.0000 | ORAL_TABLET | Freq: Four times a day (QID) | ORAL | 0 refills | Status: DC | PRN

## 2021-12-29 NOTE — Care Management CC44 (Signed)
Condition Code 44 Documentation Completed  Patient Details  Name: ETIENNE MILLWARD MRN: [id number] Date of Birth: 28-Jan-1956   Condition Code 44 given:  Yes Patient signature on Condition Code 44 notice:  Yes Documentation of 2 MD's agreement:  Yes Code 44 added to claim:  Yes    Anselm Pancoast, RN 12/29/2021, 2:49 PM

## 2021-12-29 NOTE — Discharge Summary (Signed)
Gynecology Physician Postoperative Discharge Summary  Patient ID: Judy Allen MRN: [id number] DOB/AGE: [birth year] 66 y.o.  Admit Date: 12/28/2021 Discharge Date: 12/29/2021  Preoperative Diagnoses: Pelvic pain, Mild cystocele with rectocele, postmenopausal bleeding  Procedures: Procedure(s) (LRB): ANTERIOR (CYSTOCELE) AND POSTERIOR REPAIR (RECTOCELE) (N/A) HYSTERECTOMY VAGINAL (N/A)  Hospital Course:  Judy Allen is a 66 y.o. G4P4  admitted for scheduled surgery.  She underwent the procedures as mentioned above, her operation was uncomplicated. For further details about surgery, please refer to the operative report. Patient had an uncomplicated postoperative course. By time of discharge on POD#1, her pain was controlled on oral pain medications; she was ambulating, voiding without difficulty, tolerating regular diet and passing flatus. She was deemed stable for discharge to home.   Significant Labs:    Latest Ref Rng & Units 12/28/2021    9:28 AM 12/24/2021    1:19 PM 04/23/2019   12:00 AM  CBC  WBC 4.0 - 10.5 K/uL  8.6   5.6    Hemoglobin 12.0 - 15.0 g/dL 12.6   13.1   11.9    Hematocrit 36.0 - 46.0 % 37.0   39.8   35.1    Platelets 150 - 400 K/uL  247   300     Lab Results  Component Value Date   CREATININE 0.80 12/28/2021   CREATININE 0.76 12/24/2021   CREATININE 0.90 11/24/2021     Discharge Exam: Blood pressure 140/72, pulse 88, temperature 98.4 F (36.9 C), temperature source Oral, resp. rate 18, height 5' 4"  (1.626 m), weight 78.2 kg, last menstrual period 03/04/2002, SpO2 95 %. General appearance: alert and no distress  Resp: clear to auscultation bilaterally  Cardio: regular rate and rhythm  GI: soft, non-tender; bowel sounds normal; no masses, no organomegaly.  Incision: C/D/I, no erythema, no drainage noted Pelvic: scant blood on pad (done in presence of RN as chaperone)  Extremities: extremities normal, atraumatic, no cyanosis or edema and Homans sign is  negative, no sign of DVT  Discharged Condition: Stable  Disposition: Discharge disposition: 01-Home or Self Care       Discharge Instructions     Discharge patient   Complete by: As directed    Discharge disposition: 01-Home or Self Care   Discharge patient date: 12/29/2021      Allergies as of 12/29/2021       Reactions   Aczone [dapsone] Other (See Comments)   Hypoxemia   Atarax [hydroxyzine]    Hypoxemia    Hydrochlorothiazide    History of hyperparathyroidism    Sulfa Antibiotics Rash   Sulfasalazine Rash        Medication List     TAKE these medications    albuterol 108 (90 Base) MCG/ACT inhaler Commonly known as: VENTOLIN HFA Inhale 2 puffs into the lungs every 6 (six) hours as needed for wheezing or shortness of breath.   Anoro Ellipta 62.5-25 MCG/ACT Aepb Generic drug: umeclidinium-vilanterol Inhale 1 puff into the lungs in the morning.   blood glucose meter kit and supplies Dispense based on patient and insurance preference. Use up to four times daily as directed. (FOR ICD-10 E10.9, E11.9).   busPIRone 5 MG tablet Commonly known as: BUSPAR Take 1-2 tablets (5-10 mg total) by mouth 3 (three) times daily as needed.   Contour Next Test test strip Generic drug: glucose blood USE UP TO FOUR TIMES DAILY AS DIRECTED   docusate sodium 100 MG capsule Commonly known as: COLACE Take 1 capsule (100 mg total) by  mouth 2 (two) times daily as needed.   Effexor XR 75 MG 24 hr capsule Generic drug: venlafaxine XR Take 3 capsules (225 mg total) by mouth daily with breakfast.   EYE VITAMINS & MINERALS PO Take 1 capsule by mouth in the morning. [employer] Eye Vitamins   gabapentin 300 MG capsule Commonly known as: NEURONTIN TAKE 1 CAPSULE(300 MG) BY MOUTH THREE TIMES DAILY   HYDROcodone-acetaminophen 5-325 MG tablet Commonly known as: NORCO/VICODIN Take 1-2 tablets by mouth every 6 (six) hours as needed for moderate pain.   hydrocortisone 2.5 %  cream Apply topically 2 (two) times daily. What changed:  how much to take when to take this reasons to take this   ibuprofen 600 MG tablet Commonly known as: ADVIL Take 1 tablet (600 mg total) by mouth every 6 (six) hours.   metoprolol succinate 50 MG 24 hr tablet Commonly known as: TOPROL-XL Take 50 mg by mouth in the morning.   Microlet Lancets Misc USE FOUR TIMES DAILY   NIFEdipine 30 MG 24 hr tablet Commonly known as: PROCARDIA-XL/NIFEDICAL-XL Take 30 mg by mouth at bedtime.   omega-3 acid ethyl esters 1 g capsule Commonly known as: LOVAZA TAKE 2 CAPSULES BY MOUTH TWO TIMES A DAY Strength: 1 g   omeprazole 40 MG capsule Commonly known as: PRILOSEC Take 1 capsule (40 mg total) by mouth daily. What changed: when to take this   OXYGEN Inhale 2-4 L into the lungs continuous. 2 L qd and 4 L qhs   potassium chloride SA 20 MEQ tablet Commonly known as: KLOR-CON M Take 1 tablet (20 mEq total) by mouth daily.   senna-docusate 8.6-50 MG tablet Commonly known as: Senokot-S Take 1 tablet by mouth daily. What changed:  when to take this reasons to take this   telmisartan 80 MG tablet Commonly known as: MICARDIS Take 80 mg by mouth at bedtime.   tiZANidine 4 MG tablet Commonly known as: ZANAFLEX Take 1 tablet (4 mg total) by mouth every 8 (eight) hours as needed.   torsemide 5 MG tablet Commonly known as: DEMADEX Take 5 mg by mouth every morning.   traMADol 50 MG tablet Commonly known as: ULTRAM Take 1 tablet (50 mg total) by mouth every 6 (six) hours as needed for severe pain. Each fill must last 30 days       Future Appointments  Date Time Provider [place]  01/20/2022 10:20 AM Steele Sizer, MD [facility]  01/22/2022  2:30 PM Rubie Maid, MD [facility] None  05/06/2022  1:40 PM Gillis Santa, MD [facility] None  08/12/2022 10:40 AM [place]     Rubie Maid, MD Follow up.    Specialties: Obstetrics and Gynecology, Radiology Why: 2 weeks for post-op check Contact information: [address] [place] [address] [phone]                 Total discharge time: 20 minutes   Signed:   Rubie Maid, MD [facility]

## 2021-12-29 NOTE — Care Management Obs Status (Signed)
[place] NOTIFICATION   Patient Details  Name: WINSLOW VERRILL MRN: [id number] Date of Birth: 01-May-1956   Medicare Observation Status Notification Given:  Yes    Anselm Pancoast, RN 12/29/2021, 2:49 PM

## 2021-12-29 NOTE — Progress Notes (Signed)
Pt discharged home.  Discharge instructions, prescriptions and follow up appointment given to and reviewed with pt.  Pt verbalized understanding.  Escorted by auxillary. 

## 2021-12-29 NOTE — Progress Notes (Signed)
Post-Operative Day/Hospital Day # 1, s/p Total Vaginal Hysterectomy with posterior repair  Subjective: no complaints and tolerating PO. Has not yet ambulated or voided without catheter. Notes pain is well controlled.   Objective: Temp:  [97.7 F (36.5 C)-98.6 F (37 C)] 98.4 F (36.9 C) (05/23 1156) Pulse Rate:  [79-94] 88 (05/23 1156) Resp:  [13-20] 18 (05/23 1156) BP: (113-161)/(63-96) 140/72 (05/23 1156) SpO2:  [91 %-98 %] 95 % (05/23 1156)    Physical Exam:  General: alert and no distress.  Lungs: rhonchi bibasilar and bilaterally. Lungs otherwise clear. On 4L [place] (normal requirement) Heart: regular rate and rhythm, S1, S2 normal, no murmur, click, rub or gallop Abdomen: soft, non-tender; bowel sounds normal; no masses,  no organomegaly Pelvis:Bleeding: scant on vaginal packing,   Extremities: DVT Evaluation: No evidence of DVT seen on physical exam. Negative Homan's sign. No cords or calf tenderness. No significant calf/ankle edema. SCDs in place.   Labs:     Latest Ref Rng & Units 12/28/2021    9:28 AM 12/24/2021    1:19 PM 04/23/2019   12:00 AM  CBC  WBC 4.0 - 10.5 K/uL  8.6   5.6    Hemoglobin 12.0 - 15.0 g/dL 12.6   13.1   11.9    Hematocrit 36.0 - 46.0 % 37.0   39.8   35.1    Platelets 150 - 400 K/uL  247   300      Lab Results  Component Value Date   CREATININE 0.80 12/28/2021   CREATININE 0.76 12/24/2021   CREATININE 0.90 11/24/2021      Assessment/Plan: Doing well postoperatively.  D/c foley Remove vaginal packing Regular diet as tolerated Encourage ambulation  Continue PO pain management Discharge home later this afternoon if all d/c requirements met.     Rubie Maid, MD [facility]    Rubie Maid, MD [facility]

## 2022-01-06 ENCOUNTER — Encounter: Payer: Self-pay | Admitting: Obstetrics and Gynecology

## 2022-01-07 DIAGNOSIS — R0902 Hypoxemia: Secondary | ICD-10-CM | POA: Diagnosis not present

## 2022-01-07 DIAGNOSIS — I270 Primary pulmonary hypertension: Secondary | ICD-10-CM | POA: Diagnosis not present

## 2022-01-07 DIAGNOSIS — C211 Malignant neoplasm of anal canal: Secondary | ICD-10-CM | POA: Diagnosis not present

## 2022-01-08 MED ORDER — HYDROCODONE-ACETAMINOPHEN 5-325 MG PO TABS: 1.0000 | ORAL_TABLET | Freq: Four times a day (QID) | ORAL | 0 refills | Status: DC | PRN

## 2022-01-19 NOTE — Progress Notes (Unsigned)
Name: Judy Allen   MRN: [id number]    DOB: 1955/09/20   Date:01/20/2022       Progress Note  Subjective  Chief Complaint  Follow Up  HPI  Hypertriglyceridemiareviewed last labs done in our office was 07/2021 and LDL was 70 . Currently taking Crestor , Lovaza and Gemfibrozil , denies side effects.   HTN/Paroxysmal tachycardia: . She is currently taking Norvasc 2.5 , Telmisartan 40 Metoprolol 62m , she is under the care of Dr. VSmith Mince- nephrologist. BP at home has been at goal and today bp also at goal here. Last GFR was normal     MDD: she is on Effexor .  Off Depakote and Wellbutrin because of high bp, off Abilify due to tremors, currently taking buspar but does not seem to help as much.     Hyperparathyroidism: diagnosed years ago and used to see Dr. SGabriel Carina She has been off HCTZ and last parathyroid test and vitamin D were back to normal    Emphysema and hypoxemia: on oxygen 2  liters during the day and 4 liters when sleeping . She continues to have sob is stable and only with activity, she denies coughing or wheezing . [facility] She states she has not scheduled the titration study yet   S/p hysterectomy for post menopausal bleeding, negative for cancer, also had bladder tac done - 05/22. She states doing well, no pain or problems since surgery    [facility] last visit [facility] 22 completed 5 year follow up.  She denies change in bowel movements of blood in stools.    DMII: diagnosed 02/2018 by Dr. SGabriel Carina hgbA1C was at 6.6%, A1C  but has been off Metformin and A1C has been within normal limits, down to 6 % today.  She has diabetes neuropathy - pain on both feet . She also has dyslipidemia.   Continue Gabapentin for foot pain .    Chronic pain: under the care of Dr. LHolley Raring she states she is doing well on Gabapentin , Tramadol, also taking muscle relaxer . Pain is worse on her neck, but also has back pain,  history of laminectomy. Back pain right now is zero and on her neck pain is 0/10 . Doing well   Senile purpura: she states not as bad now, usually on arms   Psoriasis: she is doing well, no current rashes . Seeing Dermatologist and under control at this time. Using an injection every 3 months - Skyrizi and symptoms are controlled    Patient Active Problem List   Diagnosis Date Noted   Cystocele with rectocele 12/28/2021   S/P vaginal hysterectomy 12/28/2021   Bilateral hip pain 11/03/2021   Hypotension due to drugs 05/03/2021   Disorder of SI (sacroiliac) joint 11/11/2020   Arthritis of sacroiliac joint of both sides 11/11/2020   Chronic radicular lumbar pain 11/11/2020   Hyperparathyroidism ([place] 08/25/2019   Diabetes mellitus type 2 in obese ([place] 11/06/2018   Cervical radiculopathy (left) 08/31/2018   Osteoarthritis of spine with radiculopathy, cervical region 08/31/2018   History of anal cancer 05/30/2018   Myofascial pain syndrome, cervical 05/16/2018   Chronic pain syndrome 05/16/2018   Cervical facet joint syndrome 05/16/2018   Perihepatitis ([place] 09/27/2017   Fatty liver 12/22/2016   Chronic respiratory failure with hypoxia, on home oxygen therapy ([place] 07/28/2016   Centrilobular emphysema ([place] 07/14/2016   Abnormal Pap smear of cervix 04/14/2016   Non-thrombocytopenic  purpura ([place]) 01/12/2016   Chronic constipation 10/16/2015   History of fusion of cervical spine 04/02/2015   Benign hypertension 01/16/2015   Chronic cervical pain 01/16/2015   CN (constipation) 01/16/2015   Gastric reflux 01/16/2015   Hypertriglyceridemia 01/16/2015   Edema leg 01/16/2015   Chronic recurrent major depressive disorder ([place]) 20/05/711   Dysmetabolic syndrome 19/75/8832   Obstructive apnea 01/16/2015   Psoriasis 01/16/2015   Allergic rhinitis 01/16/2015   Bursitis, trochanteric 01/16/2015   GAD (generalized anxiety disorder) 01/16/2015   Tachycardia, paroxysmal ([place])     Past  Surgical History:  Procedure Laterality Date   ABDOMINAL HYSTERECTOMY     ANTERIOR AND POSTERIOR REPAIR N/A 12/28/2021   Procedure: ANTERIOR (CYSTOCELE) AND POSTERIOR REPAIR (RECTOCELE);  Surgeon: Rubie Maid, MD;  Location: [facility] ORS;  Service: Gynecology;  Laterality: N/A;   ANTERIOR CERVICAL DECOMP/DISCECTOMY FUSION N/A 2001   PROCEDURE: ACDF C4-C7   ANTERIOR FUSION LUMBAR SPINE  1990   plate in back, not metal   CARDIAC CATHETERIZATION  2009?    [facility]   CARDIAC CATHETERIZATION  05/2012   RHC: Mild hypertension 37/12 with a mean pressure of 21 mm mercury   CHOLECYSTECTOMY N/A 09/12/2017   Procedure: LAPAROSCOPIC CHOLECYSTECTOMY WITH INTRAOPERATIVE CHOLANGIOGRAM;  Surgeon: Robert Bellow, MD;  Location: [facility] OR[facility];  Service: General;  Laterality: N/A;   COLONOSCOPY  2018   LIVER BIOPSY N/A 09/12/2017   Procedure: LIVER BIOPSY;  Surgeon: Robert Bellow, MD;  Location: [facility];  Service: General;  Laterality: N/A;   MASS EXCISION Left 02/23/2016   Procedure: EXCISION MASS;  Surgeon: Christene Lye, MD;  Location: [facility] OR[facility];  Service: General;  Laterality: Left;   PORT-A-CATH REMOVAL  2018   PORTACATH PLACEMENT Left 03/12/2016   Procedure: INSERTION PORT-A-CATH;  Surgeon: Christene Lye, MD;  Location: [facility];  Service: General;  Laterality: Left;   TONSILLECTOMY Bilateral 1959   TRIGGER FINGER RELEASE Right 09/2018   Dr. Sabra Heck    TUBAL LIGATION     s/p   VAGINAL HYSTERECTOMY N/A 12/28/2021   Procedure: HYSTERECTOMY VAGINAL;  Surgeon: Rubie Maid, MD;  Location: [facility];  Service: Gynecology;  Laterality: N/A;    Family History  Problem Relation Age of Onset   Heart attack Mother    Asthma Mother    Liver cancer Father    Cancer Father    Breast cancer Maternal Aunt    Bladder Cancer Neg Hx    Kidney cancer Neg Hx    Colon cancer Neg Hx     Social History   Tobacco Use   Smoking status: Former    Packs/day: 1.00    Years: 32.00    Total  pack years: 32.00    Types: Cigarettes    Start date: 08/10/1963    Quit date: 01/15/2006    Years since quitting: 16.0   Smokeless tobacco: Never  Substance Use Topics   Alcohol use: No     Current Outpatient Medications:    albuterol (PROVENTIL HFA;VENTOLIN HFA) 108 (90 Base) MCG/ACT inhaler, Inhale 2 puffs into the lungs every 6 (six) hours as needed for wheezing or shortness of breath., Disp: , Rfl:    blood glucose meter kit and supplies, Dispense based on patient and insurance preference. Use up to four times daily as directed. (FOR ICD-10 E10.9, E11.9)., Disp: 1 each, Rfl: 0   CONTOUR NEXT TEST test strip, USE UP TO FOUR TIMES DAILY AS DIRECTED, Disp: 100 each, Rfl: 5   EFFEXOR  XR 75 MG 24 hr capsule, Take 3 capsules (225 mg total) by mouth daily with breakfast., Disp: 270 capsule, Rfl: 1   gabapentin (NEURONTIN) 300 MG capsule, TAKE 1 CAPSULE(300 MG) BY MOUTH THREE TIMES DAILY, Disp: 270 capsule, Rfl: 2   hydrocortisone 2.5 % cream, Apply topically 2 (two) times daily. (Patient taking differently: Apply 1 application  topically 2 (two) times daily as needed (psoriasis.).), Disp: 30 g, Rfl: 0   metoprolol succinate (TOPROL-XL) 50 MG 24 hr tablet, Take 50 mg by mouth in the morning., Disp: , Rfl:    Microlet Lancets MISC, USE FOUR TIMES DAILY, Disp: 100 each, Rfl: 5   Multiple Vitamins-Minerals (EYE VITAMINS & MINERALS PO), Take 1 capsule by mouth in the morning. [employer] Eye Vitamins, Disp: , Rfl:    NIFEdipine (PROCARDIA-XL/NIFEDICAL-XL) 30 MG 24 hr tablet, Take 30 mg by mouth at bedtime., Disp: , Rfl:    omeprazole (PRILOSEC) 40 MG capsule, Take 1 capsule (40 mg total) by mouth daily. (Patient taking differently: Take 40 mg by mouth every morning.), Disp: 90 capsule, Rfl: 0   OXYGEN, Inhale 2-4 L into the lungs continuous. 2 L qd and 4 L qhs, Disp: , Rfl:    telmisartan (MICARDIS) 80 MG tablet, Take 80 mg by mouth at bedtime., Disp: , Rfl:    tiZANidine (ZANAFLEX) 4 MG tablet, Take 1  tablet (4 mg total) by mouth every 8 (eight) hours as needed., Disp: 270 tablet, Rfl: 2   torsemide (DEMADEX) 5 MG tablet, Take 5 mg by mouth every morning., Disp: , Rfl:    traMADol (ULTRAM) 50 MG tablet, Take 1 tablet (50 mg total) by mouth every 6 (six) hours as needed for severe pain. Each fill must last 30 days, Disp: 120 tablet, Rfl: 5   umeclidinium-vilanterol (ANORO ELLIPTA) 62.5-25 MCG/ACT AEPB, Inhale 1 puff into the lungs in the morning., Disp: , Rfl:    busPIRone (BUSPAR) 7.5 MG tablet, Take 1 tablet (7.5 mg total) by mouth 2 (two) times daily., Disp: 180 tablet, Rfl: 1   omega-3 acid ethyl esters (LOVAZA) 1 g capsule, Take 2 capsules (2 g total) by mouth 2 (two) times daily., Disp: 360 capsule, Rfl: 1  Allergies  Allergen Reactions   Aczone [Dapsone] Other (See Comments)    Hypoxemia   Atarax [Hydroxyzine]     Hypoxemia    Hydrochlorothiazide     History of hyperparathyroidism    Sulfa Antibiotics Rash   Sulfasalazine Rash    I personally reviewed active problem list, medication list, allergies, family history, social history, health maintenance with the patient/caregiver today.   ROS  Ten systems reviewed and is negative except as mentioned in HPI   Objective  Vitals:   01/20/22 1005  BP: 130/76  Pulse: 87  Resp: 16  SpO2: 96%  Weight: 170 lb (77.1 kg)  Height: 5' 4"  (1.626 m)    Body mass index is 29.18 kg/m.  Physical Exam  Constitutional: Patient appears well-developed and well-nourished.  No distress.  HEENT: head atraumatic, normocephalic, pupils equal and reactive to light, neck supple Cardiovascular: Normal rate, regular rhythm and normal heart sounds.  No murmur heard. Trace  BLE edema. Pulmonary/Chest: Effort normal and breath sounds normal. No respiratory distress. Abdominal: Soft.  There is no tenderness. Psychiatric: Patient has a normal mood and affect. behavior is normal. Judgment and thought content normal.    PHQ2/9:    01/20/2022    10:04 AM 11/06/2021   10:14 AM 11/03/2021   12:46  PM 08/06/2021   10:50 AM 07/22/2021    9:39 AM  Depression screen PHQ 2/9  Decreased Interest 0 0 0 0 0  Down, Depressed, Hopeless 2 1 0 0 0  PHQ - 2 Score 2 1 0 0 0  Altered sleeping 0 0  0 0  Tired, decreased energy 0 1  0 1  Change in appetite 0 0  0 0  Feeling bad or failure about yourself  0 0  0 0  Trouble concentrating 0 0  0 0  Moving slowly or fidgety/restless 0 3  0 0  Suicidal thoughts 0 0  0 0  PHQ-9 Score 2 5  0 1  Difficult doing work/chores  Not difficult at all  Not difficult at all Not difficult at all    phq 9 is positive   Fall Risk:    01/20/2022   10:04 AM 12/02/2021    3:49 PM 11/06/2021   10:14 AM 11/03/2021   12:46 PM 08/06/2021   10:52 AM  Fall Risk   Falls in the past year? 0 0 0 0 0  Number falls in past yr: 0 0   0  Injury with Fall? 0 0   0  Risk for fall due to : No Fall Risks    No Fall Risks  Follow up Falls prevention discussed  Falls prevention discussed  Falls prevention discussed      Functional Status Survey: Is the patient deaf or have difficulty hearing?: No Does the patient have difficulty seeing, even when wearing glasses/contacts?: No Does the patient have difficulty concentrating, remembering, or making decisions?: No Does the patient have difficulty walking or climbing stairs?: No Does the patient have difficulty dressing or bathing?: No Does the patient have difficulty doing errands alone such as visiting a doctor's office or shopping?: No    Assessment & Plan  Problem List Items Addressed This Visit     Benign hypertension    At goal       Relevant Medications   omega-3 acid ethyl esters (LOVAZA) 1 g capsule   Chronic recurrent major depressive disorder ([place])    She liked Abilify but caused tremors. We will try increasing dose of buspar to 7.5 mg and if no improvement try Vraylar       Relevant Medications   busPIRone (BUSPAR) 7.5 MG tablet   Centrilobular  emphysema (HCC)    Keep follow up with pulmonologist       Chronic respiratory failure with hypoxia, on home oxygen therapy ([facility])    Continue oxygen supplementation       Hypertriglyceridemia   Relevant Medications   omega-3 acid ethyl esters (LOVAZA) 1 g capsule   Other Visit Diagnoses     Dyslipidemia associated with type 2 diabetes mellitus (HCC)    -  Primary   Relevant Medications   omega-3 acid ethyl esters (LOVAZA) 1 g capsule   Other Relevant Orders   POCT HgB A1C (Completed)   Senile purpura (HCC)       Relevant Medications   omega-3 acid ethyl esters (LOVAZA) 1 g capsule

## 2022-01-20 ENCOUNTER — Encounter: Payer: Self-pay | Admitting: Family Medicine

## 2022-01-20 ENCOUNTER — Ambulatory Visit (INDEPENDENT_AMBULATORY_CARE_PROVIDER_SITE_OTHER): Payer: Medicare HMO | Admitting: Family Medicine

## 2022-01-20 VITALS — BP 130/76 | HR 87 | Resp 16 | Ht 64.0 in | Wt 170.0 lb

## 2022-01-20 DIAGNOSIS — I10 Essential (primary) hypertension: Secondary | ICD-10-CM

## 2022-01-20 DIAGNOSIS — Z9981 Dependence on supplemental oxygen: Secondary | ICD-10-CM

## 2022-01-20 DIAGNOSIS — J9611 Chronic respiratory failure with hypoxia: Secondary | ICD-10-CM

## 2022-01-20 DIAGNOSIS — D692 Other nonthrombocytopenic purpura: Secondary | ICD-10-CM | POA: Diagnosis not present

## 2022-01-20 DIAGNOSIS — E781 Pure hyperglyceridemia: Secondary | ICD-10-CM | POA: Diagnosis not present

## 2022-01-20 DIAGNOSIS — E1169 Type 2 diabetes mellitus with other specified complication: Secondary | ICD-10-CM

## 2022-01-20 DIAGNOSIS — E785 Hyperlipidemia, unspecified: Secondary | ICD-10-CM | POA: Diagnosis not present

## 2022-01-20 DIAGNOSIS — J432 Centrilobular emphysema: Secondary | ICD-10-CM

## 2022-01-20 DIAGNOSIS — R69 Illness, unspecified: Secondary | ICD-10-CM | POA: Diagnosis not present

## 2022-01-20 DIAGNOSIS — F339 Major depressive disorder, recurrent, unspecified: Secondary | ICD-10-CM

## 2022-01-20 LAB — POCT GLYCOSYLATED HEMOGLOBIN (HGB A1C): Hemoglobin A1C: 6 % — AB (ref 4.0–5.6)

## 2022-01-20 MED ORDER — OMEGA-3-ACID ETHYL ESTERS 1 G PO CAPS: 2.0000 g | ORAL_CAPSULE | Freq: Two times a day (BID) | ORAL | 1 refills | Status: DC

## 2022-01-20 MED ORDER — BUSPIRONE HCL 7.5 MG PO TABS: 7.5000 mg | ORAL_TABLET | Freq: Two times a day (BID) | ORAL | 1 refills | Status: DC

## 2022-01-20 NOTE — Assessment & Plan Note (Signed)
At goal.  

## 2022-01-20 NOTE — Assessment & Plan Note (Signed)
Continue oxygen supplementation

## 2022-01-20 NOTE — Assessment & Plan Note (Signed)
She liked Abilify but caused tremors. We will try increasing dose of buspar to 7.5 mg and if no improvement try Vraylar

## 2022-01-20 NOTE — Assessment & Plan Note (Signed)
Keep follow up with pulmonologist

## 2022-01-21 ENCOUNTER — Encounter: Payer: Medicare HMO | Admitting: Obstetrics and Gynecology

## 2022-01-21 ENCOUNTER — Encounter: Payer: Self-pay | Admitting: Obstetrics and Gynecology

## 2022-01-21 ENCOUNTER — Ambulatory Visit (INDEPENDENT_AMBULATORY_CARE_PROVIDER_SITE_OTHER): Payer: Medicare HMO | Admitting: Obstetrics and Gynecology

## 2022-01-21 VITALS — BP 135/100 | HR 80 | Ht 64.0 in | Wt 171.0 lb

## 2022-01-21 DIAGNOSIS — T8131XA Disruption of external operation (surgical) wound, not elsewhere classified, initial encounter: Secondary | ICD-10-CM

## 2022-01-21 DIAGNOSIS — Z4889 Encounter for other specified surgical aftercare: Secondary | ICD-10-CM

## 2022-01-21 DIAGNOSIS — Z90710 Acquired absence of both cervix and uterus: Secondary | ICD-10-CM

## 2022-01-21 DIAGNOSIS — Z9889 Other specified postprocedural states: Secondary | ICD-10-CM

## 2022-01-21 MED ORDER — PREMARIN 0.625 MG/GM VA CREA: 1.0000 | TOPICAL_CREAM | Freq: Every day | VAGINAL | 12 refills | Status: DC

## 2022-01-21 NOTE — Progress Notes (Unsigned)
    OBSTETRICS/GYNECOLOGY POST-OPERATIVE CLINIC VISIT  Subjective:     Judy Allen is a 66 y.o. female who presents to the clinic 3 weeks status postTOTAL VAGINAL HYSTERECTOMY,  ANTERIOR (CYSTOCELE) AND POSTERIOR REPAIR (RECTOCELE) (N/A)  for  Pelvic pain, Mild cystocele with rectocele, postmenopausal bleeding . Eating a regular diet without difficulty. Bowel movements are normal. Pain is controlled without any medications.  Patient is reporting a vaginal discharge and some tenderness at repair site.   The following portions of the patient's history were reviewed and updated as appropriate: allergies, current medications, past family history, past medical history, past social history, past surgical history, and problem list.  Review of Systems Pertinent items are noted in HPI.   Objective:   BP (!) 135/100   Pulse 80   Ht 5' 4"  (1.626 m)   Wt 171 lb (77.6 kg)   LMP 03/04/2002 (Approximate)   BMI 29.35 kg/m  Body mass index is 29.35 kg/m.  General:  alert and no distress  Abdomen: soft, bowel sounds active, non-tender  Pelvis:   External genitalia normal, rectovaginal septum with suspected wound breakdown at posterior repair site (perineum), suture material present, depth of ~ 0.5 cm. Moderate yellow-gray discharge present. Anterior repair intact.     Pathology:   A. UTERUS WITH CERVIX; TOTAL HYSTERECTOMY:  - UTERINE CERVIX:       - BENIGN TRANSFORMATION ZONE.       - NEGATIVE FOR SQUAMOUS INTRAEPITHELIAL LESION AND MALIGNANCY.  - ENDOMETRIUM:       - BENIGN ENDOMETRIAL POLYP.       - ATROPHIC ENDOMETRIUM.       - NEGATIVE FOR ATYPICAL HYPERPLASIA/EIN OR MALIGNANCY.  - MYOMETRIUM:       - NO SIGNIFICANT HISTOPATHOLOGIC CHANGE.   Assessment:   Patient s/p TVH, A/P repair  Superficial wound separation    Plan:   1. Continue any current medications as instructed by provider. 2. Area of superficial wound disruption cleaned with hydrogen peroxide, injected with a total  of 7 cc of 1% lidocaine, and reapproximated with 3-0 Vicryl in a normal fashion.  Wound care discussed.  Given samples of Premarin cream and advised to use daily for the next 2 weeks.  3. Operative findings again reviewed. Pathology report discussed. 4. Activity restrictions: no lifting more than 10-15 pounds and pelvic rest 5. Anticipated return to work:  N/A . 6. Follow up: 3 week for final post-op visit.     Rubie Maid, MD [facility]

## 2022-01-22 ENCOUNTER — Encounter: Payer: Medicare HMO | Admitting: Obstetrics and Gynecology

## 2022-02-02 ENCOUNTER — Encounter: Payer: Self-pay | Admitting: Obstetrics and Gynecology

## 2022-02-02 NOTE — Progress Notes (Signed)
    OBSTETRICS/GYNECOLOGY POST-OPERATIVE CLINIC VISIT  Subjective:     Judy Allen is a 66 y.o. female who presents to the clinic 5 weeks status post TOTAL VAGINAL HYSTERECTOMY,  ANTERIOR (CYSTOCELE) AND POSTERIOR REPAIR (RECTOCELE).  She was seen in the clinic approximately 2 weeks ago for routine postoperative check and it was noted that she had breakdown of her perineal repair.  The area was numbed and resutured in the clinic.  Since that time patient does still note some soreness in her perineal area.  She also complained yesterday that she was having an increase in the discharge with a foul smelling odor.  Patient denies fevers or chills.  She was initiated on Keflex.    The following portions of the patient's history were reviewed and updated as appropriate: allergies, current medications, past family history, past medical history, past social history, past surgical history, and problem list.  Review of Systems Pertinent items noted in HPI and remainder of comprehensive ROS otherwise negative.   Objective:   LMP 03/04/2002 (Approximate)  Body mass index is 28.85 kg/m.  General:  alert and no distress  Abdomen: soft, bowel sounds active, non-tender  Pelvis:   External genitalia normal, rectovaginal septum with suspected wound breakdown at posterior repair site (perineum), suture material present, depth of ~ 0.5 cm. Moderate yellow-gray discharge present (although less than previously noted at last exam). Anterior repair intact.     Pathology:  Previously reviewed patient  Assessment:    Postoperative course complicated by superficial dehiscence of operative wound   Plan:   1. Continue any current medications as instructed by provider.  Advised to continue use of Keflex to completion. 2. Wound care discussed.  Advised on sitz bath's and can use lidocaine gel for discomfort externally if needed.  3. Activity restrictions: no bending, stooping, or squatting, no lifting more  than 10 to 15 pounds, and pelvic rest 4. Anticipated return to work: not applicable. 5. Follow up: 3 weeks for postoperative check.    Rubie Maid, MD [facility]

## 2022-02-03 ENCOUNTER — Telehealth: Payer: Self-pay | Admitting: Obstetrics and Gynecology

## 2022-02-03 MED ORDER — CEPHALEXIN 500 MG PO CAPS: 500.0000 mg | ORAL_CAPSULE | Freq: Three times a day (TID) | ORAL | 0 refills | Status: DC

## 2022-02-03 NOTE — Telephone Encounter (Signed)
Patient called.  Patient aware.  

## 2022-02-03 NOTE — Telephone Encounter (Signed)
Patient called and states that she spoke with Dr.Cherry via mychart and states that she would like the antibiotic to be sent to the clinic pharmacy in [place]. Please advise

## 2022-02-04 ENCOUNTER — Encounter: Payer: Self-pay | Admitting: Obstetrics and Gynecology

## 2022-02-04 ENCOUNTER — Ambulatory Visit (INDEPENDENT_AMBULATORY_CARE_PROVIDER_SITE_OTHER): Payer: Medicare HMO | Admitting: Obstetrics and Gynecology

## 2022-02-04 VITALS — BP 129/74 | HR 84 | Resp 16 | Ht 64.0 in | Wt 168.1 lb

## 2022-02-04 DIAGNOSIS — Z9889 Other specified postprocedural states: Secondary | ICD-10-CM

## 2022-02-04 DIAGNOSIS — T8131XD Disruption of external operation (surgical) wound, not elsewhere classified, subsequent encounter: Secondary | ICD-10-CM

## 2022-02-04 DIAGNOSIS — Z90710 Acquired absence of both cervix and uterus: Secondary | ICD-10-CM

## 2022-02-04 DIAGNOSIS — Z4889 Encounter for other specified surgical aftercare: Secondary | ICD-10-CM

## 2022-02-06 DIAGNOSIS — R0902 Hypoxemia: Secondary | ICD-10-CM | POA: Diagnosis not present

## 2022-02-06 DIAGNOSIS — I270 Primary pulmonary hypertension: Secondary | ICD-10-CM | POA: Diagnosis not present

## 2022-02-06 DIAGNOSIS — C211 Malignant neoplasm of anal canal: Secondary | ICD-10-CM | POA: Diagnosis not present

## 2022-02-10 ENCOUNTER — Encounter: Payer: Medicare Other | Admitting: Family Medicine

## 2022-02-24 NOTE — Progress Notes (Deleted)
    OBSTETRICS/GYNECOLOGY POST-OPERATIVE CLINIC VISIT  Subjective:     Judy Allen is a 66 y.o. female who presents to the clinic {1-10:13787} weeks status post TOTAL VAGINAL HYSTERECTOMY,  ANTERIOR (CYSTOCELE) AND POSTERIOR REPAIR (RECTOCELE).   for for  Pelvic pain, Mild cystocele with rectocele, postmenopausal bleeding. Eating a regular diet {with-without:5700} difficulty. Bowel movements are {normal/abnormal***:19619}. {pain control:13522::"The patient is not having any pain."}  {Common ambulatory SmartLinks:19316}  Review of Systems Pertinent items are noted in HPI.   Objective:   LMP 03/04/2002 (Approximate)  There is no height or weight on file to calculate BMI.  General:  alert and no distress  Abdomen: soft, bowel sounds active, non-tender  Incision:   healing well, no drainage, no erythema, no hernia, no seroma, no swelling, no dehiscence, incision well approximated    Pathology:    Assessment:   Patient s/p *** (surgery)  {doing well:13525::"Doing well postoperatively."}   Plan:   1. Continue any current medications as instructed by provider. 2. Wound care discussed. 3. Operative findings again reviewed. Pathology report discussed. 4. Activity restrictions: {restrictions:13723} 5. Anticipated return to work: {work return:14002}. 6. Follow up: {2-48:25003} {time; units:18646} for ***    Landis Gandy, CMA [facility]

## 2022-02-25 ENCOUNTER — Encounter: Payer: Medicare HMO | Admitting: Obstetrics and Gynecology

## 2022-02-25 ENCOUNTER — Ambulatory Visit (INDEPENDENT_AMBULATORY_CARE_PROVIDER_SITE_OTHER): Payer: Medicare HMO | Admitting: Obstetrics and Gynecology

## 2022-02-25 ENCOUNTER — Encounter: Payer: Self-pay | Admitting: Obstetrics and Gynecology

## 2022-02-25 VITALS — BP 123/78 | HR 77 | Resp 16 | Ht 64.0 in | Wt 166.8 lb

## 2022-02-25 DIAGNOSIS — Z90710 Acquired absence of both cervix and uterus: Secondary | ICD-10-CM

## 2022-02-25 DIAGNOSIS — Z9889 Other specified postprocedural states: Secondary | ICD-10-CM

## 2022-02-25 DIAGNOSIS — T8131XD Disruption of external operation (surgical) wound, not elsewhere classified, subsequent encounter: Secondary | ICD-10-CM

## 2022-02-25 DIAGNOSIS — Z4889 Encounter for other specified surgical aftercare: Secondary | ICD-10-CM

## 2022-02-25 NOTE — Progress Notes (Signed)
    OBSTETRICS/GYNECOLOGY POST-OPERATIVE CLINIC VISIT  Subjective:     Judy Allen is a 66 y.o. female who presents to the clinic 7 weeks status post TOTAL VAGINAL HYSTERECTOMY,  ANTERIOR (CYSTOCELE) AND POSTERIOR REPAIR (RECTOCELE).  She was seen in the clinic approximately 4 weeks ago for routine postoperative check and it was noted that she had breakdown of her perineal repair.  The area was numbed and resutured in the clinic.  She has been seen every 2 weeks for follow up. She denies complaints today. Notes that the vaginal soreness and discharge have resolved.   The following portions of the patient's history were reviewed and updated as appropriate: allergies, current medications, past family history, past medical history, past social history, past surgical history, and problem list.  Review of Systems Pertinent items noted in HPI and remainder of comprehensive ROS otherwise negative.   Objective:   BP 123/78   Pulse 77   Resp 16   Ht 5' 4"  (1.626 m)   Wt 166 lb 12.8 oz (75.7 kg)   LMP 03/04/2002 (Approximate)   BMI 28.63 kg/m  Body mass index is 28.63 kg/m.  General:  alert and no distress  Abdomen: soft, bowel sounds active, non-tender  Pelvis:   External genitalia normal, rectovaginal septum now well-healed, no defect appreciated. Small amount of granulation tissue noted at posterior fourchette.    Pathology:  Previously reviewed patient  Assessment:    Postoperative course complicated by superficial dehiscence of operative wound   Plan:   1. Doing well post-operatively.  2. Activity restrictions: none 3. Anticipated return to work: not applicable. 4. Follow up: as needed.    Rubie Maid, MD [facility]

## 2022-02-28 ENCOUNTER — Encounter: Payer: Self-pay | Admitting: Obstetrics and Gynecology

## 2022-03-09 DIAGNOSIS — I270 Primary pulmonary hypertension: Secondary | ICD-10-CM | POA: Diagnosis not present

## 2022-03-09 DIAGNOSIS — R0902 Hypoxemia: Secondary | ICD-10-CM | POA: Diagnosis not present

## 2022-03-09 DIAGNOSIS — C211 Malignant neoplasm of anal canal: Secondary | ICD-10-CM | POA: Diagnosis not present

## 2022-03-10 ENCOUNTER — Other Ambulatory Visit: Payer: Self-pay | Admitting: Family Medicine

## 2022-03-11 ENCOUNTER — Encounter: Payer: Medicare HMO | Admitting: Obstetrics and Gynecology

## 2022-03-13 ENCOUNTER — Other Ambulatory Visit: Payer: Self-pay | Admitting: Family Medicine

## 2022-04-09 ENCOUNTER — Encounter: Payer: Self-pay | Admitting: Family Medicine

## 2022-04-09 DIAGNOSIS — I270 Primary pulmonary hypertension: Secondary | ICD-10-CM | POA: Diagnosis not present

## 2022-04-09 DIAGNOSIS — R0902 Hypoxemia: Secondary | ICD-10-CM | POA: Diagnosis not present

## 2022-04-09 DIAGNOSIS — C211 Malignant neoplasm of anal canal: Secondary | ICD-10-CM | POA: Diagnosis not present

## 2022-04-27 ENCOUNTER — Ambulatory Visit
Payer: Medicare HMO | Attending: Student in an Organized Health Care Education/Training Program | Admitting: Student in an Organized Health Care Education/Training Program

## 2022-04-27 ENCOUNTER — Encounter: Payer: Self-pay | Admitting: Student in an Organized Health Care Education/Training Program

## 2022-04-27 VITALS — BP 138/84 | HR 84 | Temp 97.3°F | Resp 18 | Ht 64.0 in | Wt 171.0 lb

## 2022-04-27 DIAGNOSIS — M25551 Pain in right hip: Secondary | ICD-10-CM | POA: Diagnosis not present

## 2022-04-27 DIAGNOSIS — G8929 Other chronic pain: Secondary | ICD-10-CM

## 2022-04-27 DIAGNOSIS — M47818 Spondylosis without myelopathy or radiculopathy, sacral and sacrococcygeal region: Secondary | ICD-10-CM | POA: Diagnosis not present

## 2022-04-27 DIAGNOSIS — M542 Cervicalgia: Secondary | ICD-10-CM | POA: Diagnosis not present

## 2022-04-27 DIAGNOSIS — G894 Chronic pain syndrome: Secondary | ICD-10-CM | POA: Diagnosis not present

## 2022-04-27 DIAGNOSIS — M7062 Trochanteric bursitis, left hip: Secondary | ICD-10-CM

## 2022-04-27 DIAGNOSIS — M7061 Trochanteric bursitis, right hip: Secondary | ICD-10-CM

## 2022-04-27 DIAGNOSIS — M533 Sacrococcygeal disorders, not elsewhere classified: Secondary | ICD-10-CM

## 2022-04-27 DIAGNOSIS — M5412 Radiculopathy, cervical region: Secondary | ICD-10-CM | POA: Diagnosis not present

## 2022-04-27 DIAGNOSIS — M5416 Radiculopathy, lumbar region: Secondary | ICD-10-CM

## 2022-04-27 DIAGNOSIS — M461 Sacroiliitis, not elsewhere classified: Secondary | ICD-10-CM

## 2022-04-27 DIAGNOSIS — M25552 Pain in left hip: Secondary | ICD-10-CM

## 2022-04-27 MED ORDER — HYDROCODONE-ACETAMINOPHEN 7.5-325 MG PO TABS: 1.0000 | ORAL_TABLET | Freq: Three times a day (TID) | ORAL | 0 refills | Status: DC | PRN

## 2022-04-27 MED ORDER — HYDROCODONE-ACETAMINOPHEN 7.5-325 MG PO TABS: 1.0000 | ORAL_TABLET | Freq: Three times a day (TID) | ORAL | 0 refills | Status: AC | PRN

## 2022-04-27 MED ORDER — GABAPENTIN 800 MG PO TABS: 400.0000 mg | ORAL_TABLET | Freq: Three times a day (TID) | ORAL | 2 refills | Status: DC

## 2022-04-27 NOTE — Progress Notes (Signed)
Nursing Pain Medication Assessment:  Safety precautions to be maintained throughout the outpatient stay will include: orient to surroundings, keep bed in low position, maintain call bell within reach at all times, provide assistance with transfer out of bed and ambulation.  Medication Inspection Compliance: Pill count conducted under aseptic conditions, in front of the patient. Neither the pills nor the bottle was removed from the patient's sight at any time. Once count was completed pills were immediately returned to the patient in their original bottle.  Medication: Tramadol (Ultram) Pill/Patch Count:  32 of 120 pills remain Pill/Patch Appearance: Markings consistent with prescribed medication Bottle Appearance: Standard pharmacy container. Clearly labeled. Filled Date: 08 / 25 / 2023 Last Medication intake:  Today

## 2022-04-27 NOTE — Progress Notes (Signed)
PROVIDER NOTE: Information contained herein reflects review and annotations entered in association with encounter. Interpretation of such information and data should be left to medically-trained personnel. Information provided to patient can be located elsewhere in the medical record under "Patient Instructions". Document created using STT-dictation technology, any transcriptional errors that may result from process are unintentional.    Patient: Judy Allen  Service Category: E/M  Provider: Gillis Santa, MD  DOB: Apr 09, 1956  DOS: 04/27/2022  Referring Provider: Steele Sizer, MD  MRN: [id number]  Specialty: Interventional Pain Management  PCP: Steele Sizer, MD  Type: Established Patient  Setting: Ambulatory outpatient    Location: Office  Delivery: Face-to-face     HPI  [name], a 66 y.o. year old female, is here today because of her Trochanteric bursitis of both hips [M70.61, M70.62]. [name] primary complain today is Neck Pain Last encounter: My last encounter with her was on 11/30/2021. Pertinent problems: [name] has Chronic recurrent major depressive disorder (Judy Allen); GAD (generalized anxiety disorder); Myofascial pain syndrome, cervical; Chronic pain syndrome; Cervical facet joint syndrome; Cervical radiculopathy (left); and Osteoarthritis of spine with radiculopathy, cervical region on their pertinent problem list. Pain Assessment: Severity of Chronic pain is reported as a 7 /10. Location: Neck  /across left shoulder down left arm to fingertips. Onset: More than a month ago. Quality: Burning, Sharp, Shooting. Timing: Constant. Modifying factor(s): "pain medicine is helping a tad". Vitals:  height is _0  (1.626 m) and weight is 171 lb (77.6 kg). Her temporal temperature is 97.3 F (36.3 C) (abnormal). Her blood pressure is 138/84 and her pulse is 84. Her respiration is 18 and oxygen saturation is 91%.   Reason for encounter: medication management.  Judy Allen follows up  today for medication management.  Her last clinic visit with me was in April.  Since then she has undergone a hysterectomy.  She has recovered well from that.  Postoperative pain has been manageable.  She states that the tramadol has become less effective and she would like to transition back to hydrocodone.  This is reasonable.  We will plan for opioid rotation back to hydrocodone 7.5 mg 3 times daily as needed.  I also discussed increasing her gabapentin to 400 mg 3 times daily.  Apparently 400 mg capsules are not covered so I will prescribe 800 mg tablet that she can take Allen tablet 3 times a day for a dose of 400 mg 3 times daily.  Otherwise no nausea, constipation or falls since her last visit with me.  Continues on home oxygen 2 L.  No change in oxygen requirement.  Pulmonary symptoms stable.  Pharmacotherapy Assessment  Analgesic: Transition to hydrocodone 7.5 mg 3 times daily as needed     Monitoring: [place] PMP: PDMP reviewed during this encounter.       Pharmacotherapy: No side-effects or adverse reactions reported. Compliance: No problems identified. Effectiveness: Clinically acceptable.  Judy Patience, RN  04/27/2022  1:13 PM  Sign when Signing Visit Nursing Pain Medication Assessment:  Safety precautions to be maintained throughout the outpatient stay will include: orient to surroundings, keep bed in low position, maintain call bell within reach at all times, provide assistance with transfer out of bed and ambulation.  Medication Inspection Compliance: Pill count conducted under aseptic conditions, in front of the patient. Neither the pills nor the bottle was removed from the patient's sight at any time. Once count was completed pills were immediately returned to the patient in their original bottle.  Medication: Tramadol (Ultram) Pill/Patch Count:  32 of 120 pills remain Pill/Patch Appearance: Markings consistent with prescribed medication Bottle Appearance: Standard pharmacy container.  Clearly labeled. Filled Date: 08 / 25 / 2023 Last Medication intake:  Today    No results found for: "CBDTHCR" No results found for: "D8THCCBX" No results found for: "D9THCCBX"  UDS:  Summary  Date Value Ref Range Status  11/03/2021 Note  Final    Comment:    ==================================================================== [employer] Select 13 (MW) ==================================================================== Test                             Result       Flag       Units  Drug Present and Declared for Prescription Verification   Tramadol                       >6098        EXPECTED   ng/mg creat   O-Desmethyltramadol            >6098        EXPECTED   ng/mg creat   N-Desmethyltramadol            3405         EXPECTED   ng/mg creat    Source of tramadol is a prescription medication. O-desmethyltramadol    and N-desmethyltramadol are expected metabolites of tramadol.  ==================================================================== Test                      Result    Flag   Units      Ref Range   Creatinine              82               mg/dL      >=20 ==================================================================== Declared Medications:  The flagging and interpretation on this report are based on the  following declared medications.  Unexpected results may arise from  inaccuracies in the declared medications.   **Note: The testing scope of this panel includes these medications:   Tramadol (Ultram)   **Note: The testing scope of this panel does not include the  following reported medications:   Albuterol (Ventolin HFA)  Fish Oil  Gabapentin (Neurontin)  Gemfibrozil (Lopid)  Hydrocortisone  Influenza Virus Vaccine (Fluzone)  Metoprolol (Toprol)  Omeprazole (Prilosec)  Telmisartan (Micardis)  Tizanidine (Zanaflex)  Torsemide (Demadex)  Umeclidinium (Anoro)  Venlafaxine (Effexor)  Vilanterol (Anoro)  Vitamin D2  (Drisdol) ==================================================================== For clinical consultation, please call [phone]. ====================================================================       ROS  Constitutional: Denies any fever or chills Gastrointestinal: No reported hemesis, hematochezia, vomiting, or acute GI distress Musculoskeletal: Denies any acute onset joint swelling, redness, loss of ROM, or weakness Neurological: No reported episodes of acute onset apraxia, aphasia, dysarthria, agnosia, amnesia, paralysis, loss of coordination, or loss of consciousness  Medication Review  HYDROcodone-acetaminophen, Multiple Vitamins-Minerals, Oxygen-Helium, albuterol, busPIRone, gabapentin, metoprolol succinate, omega-3 acid ethyl esters, telmisartan, tiZANidine, torsemide, umeclidinium-vilanterol, and venlafaxine XR  History Review  Allergy: Judy Allen is allergic to aczone [dapsone], atarax [hydroxyzine], hydrochlorothiazide, sulfa antibiotics, and sulfasalazine. Drug: Judy Allen  reports no history of drug use. Alcohol:  reports no history of alcohol use. Tobacco:  reports that she quit smoking about 16 years ago. Her smoking use included cigarettes. She started smoking about 58 years ago. She has a 32.00 pack-year smoking history. She has  never used smokeless tobacco. Social: Judy Allen  reports that she quit smoking about 16 years ago. Her smoking use included cigarettes. She started smoking about 58 years ago. She has a 32.00 pack-year smoking history. She has never used smokeless tobacco. She reports that she does not drink alcohol and does not use drugs. Medical:  has a past medical history of Anal squamous cell carcinoma ([place]) (03/10/2016), Anemia, Anxiety, Aortic atherosclerosis ([place]), Arthritis, Complication of anesthesia, Constipation, COPD (chronic obstructive pulmonary disease) ([place]), Coronary artery calcification seen on CT scan, Depression, Diastolic dysfunction  (82/42/3536), Emphysema of lung ([place]), GERD (gastroesophageal reflux disease), H/O allergic rhinitis, Heart murmur, Hemorrhoid, History of fusion of cervical spine, Hyperlipidemia, Hyperparathyroidism ([place]), Hypertension, Macular degeneration, Methemoglobinemia (09/07/2016), Neck pain, chronic, Neuritis, OSA on CPAP (09/12/2016), Ovarian failure, Oxygen dependent, Personal history of chemotherapy, Personal history of radiation therapy, Pneumonitis (2017), PONV (postoperative nausea and vomiting), Proteinuria, Psoriasis, Pulmonary HTN ([place]) (06/22/2005), Steatohepatitis, T2DM (type 2 diabetes mellitus) ([place]), Tachycardia, paroxysmal ([place]), and Urinary incontinence. Surgical: Judy Allen  has a past surgical history that includes Tubal ligation; Anterior fusion lumbar spine (1990); Mass excision (Left, 02/23/2016); Colonoscopy (2018); Portacath placement (Left, 03/12/2016); Cardiac catheterization (2009? ); Cardiac catheterization (05/2012); Port-a-cath removal (2018); Liver biopsy (N/A, 09/12/2017); Cholecystectomy (N/A, 09/12/2017); Trigger finger release (Right, 09/2018); Abdominal hysterectomy; Anterior cervical decomp/discectomy fusion (N/A, 2001); Tonsillectomy (Bilateral, 1959); Anterior and posterior repair (N/A, 12/28/2021); and Vaginal hysterectomy (N/A, 12/28/2021). Family: family history includes Asthma in her mother; Breast cancer in her maternal aunt; Cancer in her father; Heart attack in her mother; Liver cancer in her father.  Laboratory Chemistry Profile   Renal Lab Results  Component Value Date   BUN 12 12/28/2021   CREATININE 0.80 12/28/2021   LABCREA 113 07/22/2021   BCR 17 05/13/2021   GFRAA 75 04/23/2019   GFRNONAA >60 12/24/2021    Hepatic Lab Results  Component Value Date   AST 44 (H) 12/24/2021   ALT 49 (H) 12/24/2021   ALBUMIN 4.0 12/24/2021   ALKPHOS 91 12/24/2021   LIPASE 27 08/03/2017    Electrolytes Lab Results  Component Value Date   NA 142 12/28/2021   K 3.6  12/28/2021   CL 100 12/28/2021   CALCIUM 9.7 12/24/2021    Bone Lab Results  Component Value Date   VD25OH 57 07/22/2021    Inflammation (CRP: Acute Phase) (ESR: Chronic Phase) No results found for: "CRP", "ESRSEDRATE", "LATICACIDVEN"       Note: Above Lab results reviewed.  Recent Imaging Review  CT ABDOMEN PELVIS W CONTRAST CLINICAL DATA:  Weight loss.  History of anal cancer.  EXAM: CT ABDOMEN AND PELVIS WITH CONTRAST  TECHNIQUE: Multidetector CT imaging of the abdomen and pelvis was performed using the standard protocol following bolus administration of intravenous contrast.  RADIATION DOSE REDUCTION: This exam was performed according to the departmental dose-optimization program which includes automated exposure control, adjustment of the mA and/or kV according to patient size and/or use of iterative reconstruction technique.  CONTRAST:  137m OMNIPAQUE IOHEXOL 300 MG/ML  SOLN  COMPARISON:  07/14/2018.  FINDINGS: Lower chest: Dependent lower lobe opacity consistent with atelectasis, increased compared to the prior CT.  Hepatobiliary: Decreased attenuation of liver consistent with fatty infiltration. No mass or focal lesion. Status post cholecystectomy. No bile duct dilation.  Pancreas: Unremarkable. No pancreatic ductal dilatation or surrounding inflammatory changes.  Spleen: Normal in size without focal abnormality.  Adrenals/Urinary Tract: Normal adrenal glands. Two subcentimeter low-attenuation lesions arise from the superior margin of the  left kidney, consistent with cysts. No other renal masses or lesions. No stones. No hydronephrosis.  Stomach/Bowel: Stomach is within normal limits. Appendix appears normal. No evidence of bowel wall thickening, distention, or inflammatory changes.  Vascular/Lymphatic: Aortic atherosclerosis. No enlarged lymph nodes.  Reproductive: Uterus and bilateral adnexa are unremarkable.  Other: No ascites.  No abdominal  wall hernia.  Musculoskeletal: No fracture.  No bone lesion.  IMPRESSION: 1. No acute findings within the abdomen or pelvis. 2. No evidence of malignancy. 3. Hepatic steatosis.  Aortic atherosclerosis.  Electronically Signed   By: Lajean Manes M.D.   On: 11/26/2021 09:38 Note: Reviewed        Physical Exam  General appearance: Well nourished, well developed, and well hydrated. In no apparent acute distress Mental status: Alert, oriented x 3 (person, place, & time)       Respiratory: No evidence of acute respiratory distress on home oxygen 2 L Eyes: PERLA Vitals: BP 138/84   Pulse 84   Temp (!) 97.3 F (36.3 C) (Temporal)   Resp 18   Ht _0  (1.626 m)   Wt 171 lb (77.6 kg)   LMP 03/04/2002 (Approximate)   SpO2 91% Comment: on 2L O2  BMI 29.35 kg/m  BMI: Estimated body mass index is 29.35 kg/m as calculated from the following:   Height as of this encounter: _1  (1.626 m).   Weight as of this encounter: 171 lb (77.6 kg). Ideal: Ideal body weight: 54.7 kg (120 lb 9.5 oz) Adjusted ideal body weight: 63.8 kg (140 lb 12.1 oz)  Cervicalgia  Bilateral hip pain, buttock pain with occasional radiation into posterior lateral thigh  5 out of 5 strength bilateral lower extremity: Plantar flexion, dorsiflexion, knee flexion, knee extension.   Assessment   Diagnosis Status  1. Trochanteric bursitis of both hips   2. Bilateral hip pain   3. Disorder of SI (sacroiliac) joint   4. Chronic pain syndrome   5. Cervical radiculopathy (left)   6. Lumbar radiculopathy   7. Arthritis of sacroiliac joint of both sides   8. Chronic cervical pain    Controlled Controlled Controlled     Plan of Care  Problem-specific:  No problem-specific Assessment & Plan notes found for this encounter.  Judy Allen has a current medication list which includes the following long-term medication(s): effexor xr, gabapentin, and omega-3 acid ethyl esters.  Pharmacotherapy (Medications  Ordered): Meds ordered this encounter  Medications   gabapentin (NEURONTIN) 800 MG tablet    Sig: Take 0.5 tablets (400 mg total) by mouth 3 (three) times daily.    Dispense:  45 tablet    Refill:  2   HYDROcodone-acetaminophen (NORCO) 7.5-325 MG tablet    Sig: Take 1 tablet by mouth every 8 (eight) hours as needed for moderate pain.    Dispense:  90 tablet    Refill:  0   HYDROcodone-acetaminophen (NORCO) 7.5-325 MG tablet    Sig: Take 1 tablet by mouth every 8 (eight) hours as needed for moderate pain.    Dispense:  90 tablet    Refill:  0    Follow-up plan:   Return in about 8 weeks (around 06/22/2022) for Medication Management, in person.    Recent Visits No visits were found meeting these conditions. Showing recent visits within past 90 days and meeting all other requirements Today's Visits Date Type Provider Dept  04/27/22 Office Visit Gillis Santa, MD [facility]  Showing today's visits and meeting all  other requirements Future Appointments Date Type Provider Dept  06/15/22 Appointment Gillis Santa, MD [facility]  Showing future appointments within next 90 days and meeting all other requirements  I discussed the assessment and treatment plan with the patient. The patient was provided an opportunity to ask questions and all were answered. The patient agreed with the plan and demonstrated an understanding of the instructions.  Patient advised to call back or seek an in-person evaluation if the symptoms or condition worsens.  Duration of encounter: 59mnutes.  Total time on encounter, as per AMA guidelines included both the face-to-face and non-face-to-face time personally spent by the physician and/or other qualified health care professional(s) on the day of the encounter (includes time in activities that require the physician or other qualified health care professional and does not include time in activities normally performed by clinical staff).  Physician's time may include the following activities when performed: preparing to see the patient (eg, review of tests, pre-charting review of records) obtaining and/or reviewing separately obtained history performing a medically appropriate examination and/or evaluation counseling and educating the patient/family/caregiver ordering medications, tests, or procedures referring and communicating with other health care professionals (when not separately reported) documenting clinical information in the electronic or other health record independently interpreting results (not separately reported) and communicating results to the patient/ family/caregiver care coordination (not separately reported)  Note by: BGillis Santa MD Date: 04/27/2022; Time: 2:13 PM

## 2022-05-06 ENCOUNTER — Encounter: Payer: Medicare HMO | Admitting: Student in an Organized Health Care Education/Training Program

## 2022-05-09 DIAGNOSIS — I270 Primary pulmonary hypertension: Secondary | ICD-10-CM | POA: Diagnosis not present

## 2022-05-09 DIAGNOSIS — R0902 Hypoxemia: Secondary | ICD-10-CM | POA: Diagnosis not present

## 2022-05-09 DIAGNOSIS — C211 Malignant neoplasm of anal canal: Secondary | ICD-10-CM | POA: Diagnosis not present

## 2022-05-10 ENCOUNTER — Other Ambulatory Visit: Payer: Self-pay | Admitting: Family Medicine

## 2022-05-10 DIAGNOSIS — Z1231 Encounter for screening mammogram for malignant neoplasm of breast: Secondary | ICD-10-CM

## 2022-05-14 DIAGNOSIS — E213 Hyperparathyroidism, unspecified: Secondary | ICD-10-CM | POA: Diagnosis not present

## 2022-05-14 DIAGNOSIS — Z6830 Body mass index (BMI) 30.0-30.9, adult: Secondary | ICD-10-CM | POA: Diagnosis not present

## 2022-05-14 DIAGNOSIS — G4733 Obstructive sleep apnea (adult) (pediatric): Secondary | ICD-10-CM | POA: Diagnosis not present

## 2022-05-14 DIAGNOSIS — N179 Acute kidney failure, unspecified: Secondary | ICD-10-CM | POA: Diagnosis not present

## 2022-05-14 DIAGNOSIS — E785 Hyperlipidemia, unspecified: Secondary | ICD-10-CM | POA: Diagnosis not present

## 2022-05-14 DIAGNOSIS — I10 Essential (primary) hypertension: Secondary | ICD-10-CM | POA: Diagnosis not present

## 2022-05-14 DIAGNOSIS — E119 Type 2 diabetes mellitus without complications: Secondary | ICD-10-CM | POA: Diagnosis not present

## 2022-05-14 LAB — PROTEIN / CREATININE RATIO, URINE
Albumin, U: <0.3
Creatinine, Urine: 40

## 2022-05-17 ENCOUNTER — Encounter: Payer: Self-pay | Admitting: Student in an Organized Health Care Education/Training Program

## 2022-05-18 NOTE — Progress Notes (Unsigned)
Name: Judy Allen   MRN: [id number]    DOB: 1955/12/08   Date:05/19/2022       Progress Note  Subjective  Chief Complaint  Possible Hemorrhoids  HPI  History of anal cancer: patient was diagnosed with anal cancer in 2017, biopsy done by Dr. Jamal Collin and treated with radiation at [facility]. She was advised to keep monitoring the area, about 2 months ago she noticed one of the skin tags has been hard to touch, non tender, she always has some bleeding when she wipes / intermittently. She has been terrified about possible recurrence of cancer. She is afraid she will need a colostomy bag  Hair loss: it is thinning , she has fatigue, we will check TSH  MDD: long history of depression, currently on Effexor 225 mg daily , she used to take abilify but it caused tremors. Phq 9 is much higher today. She is worried about her children and possible cancer recurrence, she feels anxious. Discussed options and we will try low dose Vraylar  Patient Active Problem List   Diagnosis Date Noted   Cystocele with rectocele 12/28/2021   S/P vaginal hysterectomy 12/28/2021   Bilateral hip pain 11/03/2021   Hypotension due to drugs 05/03/2021   Disorder of SI (sacroiliac) joint 11/11/2020   Arthritis of sacroiliac joint of both sides 11/11/2020   Chronic radicular lumbar pain 11/11/2020   Hyperparathyroidism ([place]) 08/25/2019   Diabetes mellitus type 2 in obese ([place]) 11/06/2018   Cervical radiculopathy (left) 08/31/2018   Osteoarthritis of spine with radiculopathy, cervical region 08/31/2018   History of anal cancer 05/30/2018   Myofascial pain syndrome, cervical 05/16/2018   Chronic pain syndrome 05/16/2018   Cervical facet joint syndrome 05/16/2018   Perihepatitis ([place]) 09/27/2017   Fatty liver 12/22/2016   Chronic respiratory failure with hypoxia, on home oxygen therapy ([place]) 07/28/2016   Centrilobular emphysema ([place]) 07/14/2016   Abnormal Pap smear of cervix 04/14/2016   Non-thrombocytopenic purpura ([place])  01/12/2016   Chronic constipation 10/16/2015   History of fusion of cervical spine 04/02/2015   Benign hypertension 01/16/2015   Chronic cervical pain 01/16/2015   CN (constipation) 01/16/2015   Gastric reflux 01/16/2015   Hypertriglyceridemia 01/16/2015   Edema leg 01/16/2015   Chronic recurrent major depressive disorder ([place]) 48/27/0786   Dysmetabolic syndrome 75/44/9201   Obstructive apnea 01/16/2015   Psoriasis 01/16/2015   Allergic rhinitis 01/16/2015   Bursitis, trochanteric 01/16/2015   GAD (generalized anxiety disorder) 01/16/2015   Tachycardia, paroxysmal ([place])     Past Surgical History:  Procedure Laterality Date   ABDOMINAL HYSTERECTOMY     ANTERIOR AND POSTERIOR REPAIR N/A 12/28/2021   Procedure: ANTERIOR (CYSTOCELE) AND POSTERIOR REPAIR (RECTOCELE);  Surgeon: Rubie Maid, MD;  Location: [facility] ORS;  Service: Gynecology;  Laterality: N/A;   ANTERIOR CERVICAL DECOMP/DISCECTOMY FUSION N/A 2001   PROCEDURE: ACDF C4-C7   ANTERIOR FUSION LUMBAR SPINE  1990   plate in back, not metal   CARDIAC CATHETERIZATION  2009?    [facility]   CARDIAC CATHETERIZATION  05/2012   RHC: Mild hypertension 37/12 with a mean pressure of 21 mm mercury   CHOLECYSTECTOMY N/A 09/12/2017   Procedure: LAPAROSCOPIC CHOLECYSTECTOMY WITH INTRAOPERATIVE CHOLANGIOGRAM;  Surgeon: Robert Bellow, MD;  Location: [facility];  Service: General;  Laterality: N/A;   COLONOSCOPY  2018   LIVER BIOPSY N/A 09/12/2017   Procedure: LIVER BIOPSY;  Surgeon: Robert Bellow, MD;  Location: [facility];  Service: General;  Laterality: N/A;   MASS EXCISION Left  02/23/2016   Procedure: EXCISION MASS;  Surgeon: Christene Lye, MD;  Location: [facility];  Service: General;  Laterality: Left;   PORT-A-CATH REMOVAL  2018   PORTACATH PLACEMENT Left 03/12/2016   Procedure: INSERTION PORT-A-CATH;  Surgeon: Christene Lye, MD;  Location: [facility];  Service: General;  Laterality: Left;   TONSILLECTOMY Bilateral  1959   TRIGGER FINGER RELEASE Right 09/2018   Dr. Sabra Heck    TUBAL LIGATION     s/p   VAGINAL HYSTERECTOMY N/A 12/28/2021   Procedure: HYSTERECTOMY VAGINAL;  Surgeon: Rubie Maid, MD;  Location: [facility];  Service: Gynecology;  Laterality: N/A;    Family History  Problem Relation Age of Onset   Heart attack Mother    Asthma Mother    Liver cancer Father    Cancer Father    Breast cancer Maternal Aunt    Bladder Cancer Neg Hx    Kidney cancer Neg Hx    Colon cancer Neg Hx     Social History   Tobacco Use   Smoking status: Former    Packs/day: 1.00    Years: 32.00    Total pack years: 32.00    Types: Cigarettes    Start date: 08/10/1963    Quit date: 01/15/2006    Years since quitting: 16.3   Smokeless tobacco: Never  Substance Use Topics   Alcohol use: No     Current Outpatient Medications:    albuterol (PROVENTIL HFA;VENTOLIN HFA) 108 (90 Base) MCG/ACT inhaler, Inhale 2 puffs into the lungs every 6 (six) hours as needed for wheezing or shortness of breath., Disp: , Rfl:    busPIRone (BUSPAR) 7.5 MG tablet, Take 1 tablet (7.5 mg total) by mouth 2 (two) times daily., Disp: 180 tablet, Rfl: 1   cariprazine (VRAYLAR) 1.5 MG capsule, Take 1 capsule (1.5 mg total) by mouth daily., Disp: 30 capsule, Rfl: 0   EFFEXOR XR 75 MG 24 hr capsule, TAKE 3 CAPSULES DAILY WITH BREAKFAST, Disp: 270 capsule, Rfl: 1   gabapentin (NEURONTIN) 800 MG tablet, Take 0.5 tablets (400 mg total) by mouth 3 (three) times daily., Disp: 45 tablet, Rfl: 2   HYDROcodone-acetaminophen (NORCO) 7.5-325 MG tablet, Take 1 tablet by mouth every 8 (eight) hours as needed for moderate pain., Disp: 90 tablet, Rfl: 0   [START ON 05/27/2022] HYDROcodone-acetaminophen (NORCO) 7.5-325 MG tablet, Take 1 tablet by mouth every 8 (eight) hours as needed for moderate pain., Disp: 90 tablet, Rfl: 0   metoprolol succinate (TOPROL-XL) 50 MG 24 hr tablet, Take 50 mg by mouth in the morning., Disp: , Rfl:    Multiple  Vitamins-Minerals (EYE VITAMINS & MINERALS PO), Take 1 capsule by mouth in the morning. [employer] Eye Vitamins, Disp: , Rfl:    NIFEdipine (PROCARDIA-XL/NIFEDICAL-XL) 30 MG 24 hr tablet, Take 30 mg by mouth at bedtime., Disp: , Rfl:    omega-3 acid ethyl esters (LOVAZA) 1 g capsule, Take 2 capsules (2 g total) by mouth 2 (two) times daily., Disp: 360 capsule, Rfl: 1   omeprazole (PRILOSEC) 40 MG capsule, Take 40 mg by mouth daily., Disp: , Rfl:    OXYGEN, Inhale 2-4 L into the lungs continuous. 2 L qd and 4 L qhs, Disp: , Rfl:    telmisartan (MICARDIS) 80 MG tablet, Take 80 mg by mouth at bedtime., Disp: , Rfl:    tiZANidine (ZANAFLEX) 4 MG tablet, Take 1 tablet (4 mg total) by mouth every 8 (eight) hours as needed., Disp: 270 tablet, Rfl: 2  torsemide (DEMADEX) 5 MG tablet, Take 5 mg by mouth every morning., Disp: , Rfl:    umeclidinium-vilanterol (ANORO ELLIPTA) 62.5-25 MCG/ACT AEPB, Inhale 1 puff into the lungs in the morning., Disp: , Rfl:   Allergies  Allergen Reactions   Aczone [Dapsone] Other (See Comments)    Hypoxemia   Atarax [Hydroxyzine]     Hypoxemia    Hydrochlorothiazide     History of hyperparathyroidism    Sulfa Antibiotics Rash   Sulfasalazine Rash    I personally reviewed active problem list, medication list, allergies, family history, social history, health maintenance with the patient/caregiver today.   ROS  Ten systems reviewed and is negative except as mentioned in HPI   Objective  Vitals:   05/19/22 1007  BP: 132/82  Pulse: 90  Resp: 18  Temp: 97.7 F (36.5 C)  TempSrc: Oral  SpO2: 90%  Weight: 175 lb 4.8 oz (79.5 kg)  Height: 5' 4"  (1.626 m)    Body mass index is 30.09 kg/m.  Physical Exam  Constitutional: Patient appears well-developed and well-nourished. Obese  No distress.  HEENT: head atraumatic, normocephalic, pupils equal and reactive to light, neck supple, throat within normal limits  Cardiovascular: Normal rate, regular rhythm and  normal heart sounds.  No murmur heard. No BLE edema. Pulmonary/Chest: Effort normal and breath sounds normal. No respiratory distress. Abdominal: Soft.  There is no tenderness. Rectal exam: she has multiple skin tags, the one located at 3 o'clock has an area of induration, she also has a dry spot on right buttocks, some change in color of her sacral area due to radiation, rectal exam: 6 o'clock seems to have scar tissue, normal tonus, no internal masses felt  Psychiatric: Patient has a normal mood and affect. behavior is normal. Judgment and thought content normal.   PHQ2/9:    05/19/2022   10:09 AM 01/20/2022   10:04 AM 11/06/2021   10:14 AM 11/03/2021   12:46 PM 08/06/2021   10:50 AM  Depression screen PHQ 2/9  Decreased Interest 2 0 0 0 0  Down, Depressed, Hopeless 3 2 1  0 0  PHQ - 2 Score 5 2 1  0 0  Altered sleeping 1 0 0  0  Tired, decreased energy 2 0 1  0  Change in appetite 2 0 0  0  Feeling bad or failure about yourself  2 0 0  0  Trouble concentrating 0 0 0  0  Moving slowly or fidgety/restless 1 0 3  0  Suicidal thoughts 0 0 0  0  PHQ-9 Score 13 2 5   0  Difficult doing work/chores Somewhat difficult  Not difficult at all  Not difficult at all    phq 9 is positive   Fall Risk:    04/27/2022    1:11 PM 02/04/2022   11:38 AM 01/20/2022   10:04 AM 12/02/2021    3:49 PM 11/06/2021   10:14 AM  Fall Risk   Falls in the past year? 0 0 0 0 0  Number falls in past yr:  0 0 0   Injury with Fall?  0 0 0   Risk for fall due to :  No Fall Risks No Fall Risks    Follow up  Falls evaluation completed Falls prevention discussed  Falls prevention discussed      Assessment & Plan  1. Hair loss  - TSH  2. Hemorrhoidal skin tags  - Ambulatory referral to General Surgery  3. History of anal cancer  -  Ambulatory referral to General Surgery  4. Major depression, recurrent, chronic (HCC)  - cariprazine (VRAYLAR) 1.5 MG capsule; Take 1 capsule (1.5 mg total) by mouth daily.   Dispense: 30 capsule; Refill: 0

## 2022-05-19 ENCOUNTER — Encounter: Payer: Self-pay | Admitting: Family Medicine

## 2022-05-19 ENCOUNTER — Ambulatory Visit (INDEPENDENT_AMBULATORY_CARE_PROVIDER_SITE_OTHER): Payer: Medicare HMO | Admitting: Family Medicine

## 2022-05-19 VITALS — BP 132/82 | HR 90 | Temp 97.7°F | Resp 18 | Ht 64.0 in | Wt 175.3 lb

## 2022-05-19 DIAGNOSIS — Z85048 Personal history of other malignant neoplasm of rectum, rectosigmoid junction, and anus: Secondary | ICD-10-CM | POA: Diagnosis not present

## 2022-05-19 DIAGNOSIS — F339 Major depressive disorder, recurrent, unspecified: Secondary | ICD-10-CM

## 2022-05-19 DIAGNOSIS — L659 Nonscarring hair loss, unspecified: Secondary | ICD-10-CM | POA: Diagnosis not present

## 2022-05-19 DIAGNOSIS — K644 Residual hemorrhoidal skin tags: Secondary | ICD-10-CM

## 2022-05-19 DIAGNOSIS — R69 Illness, unspecified: Secondary | ICD-10-CM | POA: Diagnosis not present

## 2022-05-19 MED ORDER — CARIPRAZINE HCL 1.5 MG PO CAPS: 1.5000 mg | ORAL_CAPSULE | Freq: Every day | ORAL | 0 refills | Status: DC

## 2022-05-20 LAB — TSH: TSH: 3.81 mIU/L (ref 0.40–4.50)

## 2022-05-26 ENCOUNTER — Ambulatory Visit
Admission: RE | Admit: 2022-05-26 | Discharge: 2022-05-26 | Disposition: A | Discharge status: Home / Self Care | Payer: Medicare HMO | Source: Ambulatory Visit | Attending: Family Medicine | Admitting: Family Medicine

## 2022-05-26 DIAGNOSIS — Z1231 Encounter for screening mammogram for malignant neoplasm of breast: Secondary | ICD-10-CM | POA: Diagnosis not present

## 2022-05-27 ENCOUNTER — Other Ambulatory Visit: Payer: Self-pay | Admitting: Family Medicine

## 2022-05-27 DIAGNOSIS — R928 Other abnormal and inconclusive findings on diagnostic imaging of breast: Secondary | ICD-10-CM

## 2022-05-27 DIAGNOSIS — N63 Unspecified lump in unspecified breast: Secondary | ICD-10-CM

## 2022-05-28 DIAGNOSIS — R159 Full incontinence of feces: Secondary | ICD-10-CM | POA: Diagnosis not present

## 2022-05-28 DIAGNOSIS — R1319 Other dysphagia: Secondary | ICD-10-CM | POA: Diagnosis not present

## 2022-05-28 DIAGNOSIS — Z85048 Personal history of other malignant neoplasm of rectum, rectosigmoid junction, and anus: Secondary | ICD-10-CM | POA: Diagnosis not present

## 2022-05-28 DIAGNOSIS — Z6830 Body mass index (BMI) 30.0-30.9, adult: Secondary | ICD-10-CM | POA: Diagnosis not present

## 2022-05-28 DIAGNOSIS — R1084 Generalized abdominal pain: Secondary | ICD-10-CM | POA: Diagnosis not present

## 2022-05-28 DIAGNOSIS — R634 Abnormal weight loss: Secondary | ICD-10-CM | POA: Diagnosis not present

## 2022-05-28 DIAGNOSIS — R194 Change in bowel habit: Secondary | ICD-10-CM | POA: Diagnosis not present

## 2022-05-28 DIAGNOSIS — R42 Dizziness and giddiness: Secondary | ICD-10-CM | POA: Diagnosis not present

## 2022-06-02 DIAGNOSIS — G4736 Sleep related hypoventilation in conditions classified elsewhere: Secondary | ICD-10-CM | POA: Diagnosis not present

## 2022-06-02 DIAGNOSIS — J9611 Chronic respiratory failure with hypoxia: Secondary | ICD-10-CM | POA: Diagnosis not present

## 2022-06-02 DIAGNOSIS — R0609 Other forms of dyspnea: Secondary | ICD-10-CM | POA: Diagnosis not present

## 2022-06-02 DIAGNOSIS — I272 Pulmonary hypertension, unspecified: Secondary | ICD-10-CM | POA: Diagnosis not present

## 2022-06-02 DIAGNOSIS — J984 Other disorders of lung: Secondary | ICD-10-CM | POA: Diagnosis not present

## 2022-06-02 DIAGNOSIS — Z9981 Dependence on supplemental oxygen: Secondary | ICD-10-CM | POA: Diagnosis not present

## 2022-06-02 DIAGNOSIS — J438 Other emphysema: Secondary | ICD-10-CM | POA: Diagnosis not present

## 2022-06-08 DIAGNOSIS — K625 Hemorrhage of anus and rectum: Secondary | ICD-10-CM | POA: Diagnosis not present

## 2022-06-09 DIAGNOSIS — G4733 Obstructive sleep apnea (adult) (pediatric): Secondary | ICD-10-CM | POA: Diagnosis not present

## 2022-06-09 DIAGNOSIS — R0902 Hypoxemia: Secondary | ICD-10-CM | POA: Diagnosis not present

## 2022-06-09 DIAGNOSIS — C211 Malignant neoplasm of anal canal: Secondary | ICD-10-CM | POA: Diagnosis not present

## 2022-06-09 DIAGNOSIS — I270 Primary pulmonary hypertension: Secondary | ICD-10-CM | POA: Diagnosis not present

## 2022-06-09 DIAGNOSIS — J432 Centrilobular emphysema: Secondary | ICD-10-CM | POA: Diagnosis not present

## 2022-06-09 NOTE — Progress Notes (Signed)
Name: Judy Allen   MRN: [id number]    DOB: Nov 26, 1955   Date:06/10/2022       Progress Note  Subjective  Chief Complaint  Follow up   HPI  History of anal cancer: patient was diagnosed with anal cancer in 2017, biopsy done by Dr. Evette Cristal and treated with radiation at [facility]. She was advised to keep monitoring the area, about 2 months ago she noticed one of the skin tags has been hard to touch, non tender, she always has some bleeding when she wipes / intermittently. She has been terrified about possible recurrence of cancer.  She was seen by Dr. Birdie Sons recently and given reassurance and not worried about it now   Abnormal mammogram: going back for add views next week.   MDD: long history of depression, currently on Effexor 225 mg daily , she used to take abilify but it caused tremors. Phq 9 was much higher during her last visit one month ago, and we gave her Leafy Kindle, initially caused her to feel very sleepy, after that she got used to medication and anxiety plus depression are back to the same level as it was one month ago, she would like to try a higher dose today. She does not want to see a psychiatrist . She went to [facility] in the past but does not want to go back at this time. We will increase dose of medication today but if does not work she agrees on getting a referral to a psychiatrist again   Patient Active Problem List   Diagnosis Date Noted   Cystocele with rectocele 12/28/2021   S/P vaginal hysterectomy 12/28/2021   Bilateral hip pain 11/03/2021   Hypotension due to drugs 05/03/2021   Disorder of SI (sacroiliac) joint 11/11/2020   Arthritis of sacroiliac joint of both sides 11/11/2020   Chronic radicular lumbar pain 11/11/2020   Hyperparathyroidism (HCC) 08/25/2019   Diabetes mellitus type 2 in obese (HCC) 11/06/2018   Cervical radiculopathy (left) 08/31/2018   Osteoarthritis of spine with radiculopathy, cervical region 08/31/2018   History of anal cancer 05/30/2018    Myofascial pain syndrome, cervical 05/16/2018   Chronic pain syndrome 05/16/2018   Cervical facet joint syndrome 05/16/2018   Perihepatitis (HCC) 09/27/2017   Fatty liver 12/22/2016   Chronic respiratory failure with hypoxia, on home oxygen therapy (HCC) 07/28/2016   Centrilobular emphysema (HCC) 07/14/2016   Abnormal Pap smear of cervix 04/14/2016   Non-thrombocytopenic purpura (HCC) 01/12/2016   Chronic constipation 10/16/2015   History of fusion of cervical spine 04/02/2015   Benign hypertension 01/16/2015   Chronic cervical pain 01/16/2015   CN (constipation) 01/16/2015   Gastric reflux 01/16/2015   Hypertriglyceridemia 01/16/2015   Edema leg 01/16/2015   Chronic recurrent major depressive disorder (HCC) 01/16/2015   Dysmetabolic syndrome 01/16/2015   Obstructive apnea 01/16/2015   Psoriasis 01/16/2015   Allergic rhinitis 01/16/2015   Bursitis, trochanteric 01/16/2015   GAD (generalized anxiety disorder) 01/16/2015   Tachycardia, paroxysmal (HCC)     Past Surgical History:  Procedure Laterality Date   ABDOMINAL HYSTERECTOMY     ANTERIOR AND POSTERIOR REPAIR N/A 12/28/2021   Procedure: ANTERIOR (CYSTOCELE) AND POSTERIOR REPAIR (RECTOCELE);  Surgeon: Hildred Laser, MD;  Location: [facility];  Service: Gynecology;  Laterality: N/A;   ANTERIOR CERVICAL DECOMP/DISCECTOMY FUSION N/A 2001   PROCEDURE: ACDF C4-C7   ANTERIOR FUSION LUMBAR SPINE  1990   plate in back, not metal   CARDIAC CATHETERIZATION  2009?    [facility]  CARDIAC CATHETERIZATION  05/2012   RHC: Mild hypertension 37/12 with a mean pressure of 21 mm mercury   CHOLECYSTECTOMY N/A 09/12/2017   Procedure: LAPAROSCOPIC CHOLECYSTECTOMY WITH INTRAOPERATIVE CHOLANGIOGRAM;  Surgeon: Earline Mayotte, MD;  Location: [facility];  Service: General;  Laterality: N/A;   COLONOSCOPY  2018   LIVER BIOPSY N/A 09/12/2017   Procedure: LIVER BIOPSY;  Surgeon: Earline Mayotte, MD;  Location: [facility];  Service: General;   Laterality: N/A;   MASS EXCISION Left 02/23/2016   Procedure: EXCISION MASS;  Surgeon: Kieth Brightly, MD;  Location: [facility];  Service: General;  Laterality: Left;   PORT-A-CATH REMOVAL  2018   PORTACATH PLACEMENT Left 03/12/2016   Procedure: INSERTION PORT-A-CATH;  Surgeon: Kieth Brightly, MD;  Location: [facility];  Service: General;  Laterality: Left;   TONSILLECTOMY Bilateral 1959   TRIGGER FINGER RELEASE Right 09/2018   Dr. Hyacinth Meeker    TUBAL LIGATION     s/p   VAGINAL HYSTERECTOMY N/A 12/28/2021   Procedure: HYSTERECTOMY VAGINAL;  Surgeon: Hildred Laser, MD;  Location: [facility];  Service: Gynecology;  Laterality: N/A;    Family History  Problem Relation Age of Onset   Heart attack Mother    Asthma Mother    Liver cancer Father    Cancer Father    Breast cancer Maternal Aunt    Bladder Cancer Neg Hx    Kidney cancer Neg Hx    Colon cancer Neg Hx     Social History   Tobacco Use   Smoking status: Former    Packs/day: 1.00    Years: 32.00    Total pack years: 32.00    Types: Cigarettes    Start date: 08/10/1963    Quit date: 01/15/2006    Years since quitting: 16.4   Smokeless tobacco: Never  Substance Use Topics   Alcohol use: No     Current Outpatient Medications:    albuterol (PROVENTIL HFA;VENTOLIN HFA) 108 (90 Base) MCG/ACT inhaler, Inhale 2 puffs into the lungs every 6 (six) hours as needed for wheezing or shortness of breath., Disp: , Rfl:    busPIRone (BUSPAR) 7.5 MG tablet, Take 1 tablet (7.5 mg total) by mouth 2 (two) times daily., Disp: 180 tablet, Rfl: 1   EFFEXOR XR 75 MG 24 hr capsule, TAKE 3 CAPSULES DAILY WITH BREAKFAST, Disp: 270 capsule, Rfl: 1   gabapentin (NEURONTIN) 800 MG tablet, Take 0.5 tablets (400 mg total) by mouth 3 (three) times daily., Disp: 45 tablet, Rfl: 2   HYDROcodone-acetaminophen (NORCO) 7.5-325 MG tablet, Take 1 tablet by mouth every 8 (eight) hours as needed for moderate pain., Disp: 90 tablet, Rfl: 0   metoprolol  succinate (TOPROL-XL) 50 MG 24 hr tablet, Take 50 mg by mouth in the morning., Disp: , Rfl:    Multiple Vitamins-Minerals (EYE VITAMINS & MINERALS PO), Take 1 capsule by mouth in the morning. [employer] Eye Vitamins, Disp: , Rfl:    NIFEdipine (PROCARDIA-XL/NIFEDICAL-XL) 30 MG 24 hr tablet, Take 30 mg by mouth at bedtime., Disp: , Rfl:    omega-3 acid ethyl esters (LOVAZA) 1 g capsule, Take 2 capsules (2 g total) by mouth 2 (two) times daily., Disp: 360 capsule, Rfl: 1   omeprazole (PRILOSEC) 40 MG capsule, Take 40 mg by mouth daily., Disp: , Rfl:    OXYGEN, Inhale 2-4 L into the lungs continuous. 2 L qd and 4 L qhs, Disp: , Rfl:    telmisartan (MICARDIS) 80 MG tablet, Take 80 mg by  mouth at bedtime., Disp: , Rfl:    tiZANidine (ZANAFLEX) 4 MG tablet, Take 1 tablet (4 mg total) by mouth every 8 (eight) hours as needed., Disp: 270 tablet, Rfl: 2   torsemide (DEMADEX) 5 MG tablet, Take 5 mg by mouth every morning., Disp: , Rfl:    umeclidinium-vilanterol (ANORO ELLIPTA) 62.5-25 MCG/ACT AEPB, Inhale 1 puff into the lungs in the morning., Disp: , Rfl:    cariprazine (VRAYLAR) 3 MG capsule, Take 1 capsule (3 mg total) by mouth daily., Disp: 30 capsule, Rfl: 1  Allergies  Allergen Reactions   Aczone [Dapsone] Other (See Comments)    Hypoxemia   Atarax [Hydroxyzine]     Hypoxemia    Hydrochlorothiazide     History of hyperparathyroidism    Sulfa Antibiotics Rash   Sulfasalazine Rash    I personally reviewed active problem list, medication list, allergies, family history, social history with the patient/caregiver today.   ROS  Ten systems reviewed and is negative except as mentioned in HPI  Objective  Vitals:   06/10/22 1009 06/10/22 1021  BP: (!) 154/90 138/86  Pulse: 80   Resp: 16   Temp: 97.8 F (36.6 C)   TempSrc: Oral   SpO2: 90%   Weight: 177 lb 14.4 oz (80.7 kg)   Height: 5\' 4"  (1.626 m)     Body mass index is 30.54 kg/m.  Physical Exam  Constitutional: Patient  appears well-developed and well-nourished. Obese  No distress.  HEENT: head atraumatic, normocephalic, pupils equal and reactive to light, neck supple Cardiovascular: Normal rate, regular rhythm and normal heart sounds.  No murmur heard. No BLE edema. Pulmonary/Chest: Effort normal and breath sounds normal. No respiratory distress. Abdominal: Soft.  There is no tenderness. Psychiatric: Patient has a normal mood and affect. behavior is normal. Judgment and thought content normal.   Recent Results (from the past 2160 hour(s))  TSH     Status: None   Collection Time: 05/19/22 11:02 AM  Result Value Ref Range   TSH 3.81 0.40 - 4.50 mIU/L    PHQ2/9:    06/10/2022   10:12 AM 05/19/2022   10:09 AM 01/20/2022   10:04 AM 11/06/2021   10:14 AM 11/03/2021   12:46 PM  Depression screen PHQ 2/9  Decreased Interest 2 2 0 0 0  Down, Depressed, Hopeless 3 3 2 1  0  PHQ - 2 Score 5 5 2 1  0  Altered sleeping 0 1 0 0   Tired, decreased energy 2 2 0 1   Change in appetite 2 2 0 0   Feeling bad or failure about yourself  1 2 0 0   Trouble concentrating 1 0 0 0   Moving slowly or fidgety/restless 1 1 0 3   Suicidal thoughts 0 0 0 0   PHQ-9 Score 12 13 2 5    Difficult doing work/chores Somewhat difficult Somewhat difficult  Not difficult at all     phq 9 is positive   Fall Risk:    06/10/2022   10:11 AM 04/27/2022    1:11 PM 02/04/2022   11:38 AM 01/20/2022   10:04 AM 12/02/2021    3:49 PM  Fall Risk   Falls in the past year? 0 0 0 0 0  Number falls in past yr: 0  0 0 0  Injury with Fall? 0  0 0 0  Risk for fall due to :   No Fall Risks No Fall Risks   Follow up   Falls  evaluation completed Falls prevention discussed      Functional Status Survey: Is the patient deaf or have difficulty hearing?: No Does the patient have difficulty seeing, even when wearing glasses/contacts?: No Does the patient have difficulty concentrating, remembering, or making decisions?: No Does the patient have  difficulty walking or climbing stairs?: No Does the patient have difficulty dressing or bathing?: No Does the patient have difficulty doing errands alone such as visiting a doctor's office or shopping?: No    Assessment & Plan  1. Major depression, recurrent, chronic (HCC)  - cariprazine (VRAYLAR) 3 MG capsule; Take 1 capsule (3 mg total) by mouth daily.  Dispense: 30 capsule; Refill: 1

## 2022-06-10 ENCOUNTER — Encounter: Payer: Self-pay | Admitting: Family Medicine

## 2022-06-10 ENCOUNTER — Ambulatory Visit (INDEPENDENT_AMBULATORY_CARE_PROVIDER_SITE_OTHER): Payer: Medicare HMO | Admitting: Family Medicine

## 2022-06-10 DIAGNOSIS — F339 Major depressive disorder, recurrent, unspecified: Secondary | ICD-10-CM

## 2022-06-10 DIAGNOSIS — R69 Illness, unspecified: Secondary | ICD-10-CM | POA: Diagnosis not present

## 2022-06-10 MED ORDER — CARIPRAZINE HCL 3 MG PO CAPS: 3.0000 mg | ORAL_CAPSULE | Freq: Every day | ORAL | 1 refills | Status: DC

## 2022-06-13 ENCOUNTER — Encounter: Payer: Self-pay | Admitting: Family Medicine

## 2022-06-13 DIAGNOSIS — F339 Major depressive disorder, recurrent, unspecified: Secondary | ICD-10-CM

## 2022-06-14 MED ORDER — CARIPRAZINE HCL 3 MG PO CAPS: 3.0000 mg | ORAL_CAPSULE | Freq: Every day | ORAL | 1 refills | Status: DC

## 2022-06-15 ENCOUNTER — Ambulatory Visit
Payer: Medicare HMO | Attending: Student in an Organized Health Care Education/Training Program | Admitting: Student in an Organized Health Care Education/Training Program

## 2022-06-15 ENCOUNTER — Encounter: Payer: Self-pay | Admitting: Student in an Organized Health Care Education/Training Program

## 2022-06-15 ENCOUNTER — Other Ambulatory Visit: Payer: Self-pay | Admitting: *Deleted

## 2022-06-15 VITALS — BP 152/94 | HR 88 | Temp 97.7°F | Resp 16 | Ht 64.0 in | Wt 177.0 lb

## 2022-06-15 DIAGNOSIS — G894 Chronic pain syndrome: Secondary | ICD-10-CM | POA: Diagnosis not present

## 2022-06-15 DIAGNOSIS — M533 Sacrococcygeal disorders, not elsewhere classified: Secondary | ICD-10-CM | POA: Insufficient documentation

## 2022-06-15 DIAGNOSIS — M542 Cervicalgia: Secondary | ICD-10-CM | POA: Insufficient documentation

## 2022-06-15 DIAGNOSIS — M5416 Radiculopathy, lumbar region: Secondary | ICD-10-CM | POA: Insufficient documentation

## 2022-06-15 DIAGNOSIS — M5412 Radiculopathy, cervical region: Secondary | ICD-10-CM | POA: Diagnosis not present

## 2022-06-15 DIAGNOSIS — G8929 Other chronic pain: Secondary | ICD-10-CM | POA: Diagnosis not present

## 2022-06-15 MED ORDER — HYDROCODONE-ACETAMINOPHEN 7.5-325 MG PO TABS: 1.0000 | ORAL_TABLET | Freq: Three times a day (TID) | ORAL | 0 refills | Status: AC | PRN

## 2022-06-15 MED ORDER — BELBUCA 450 MCG BU FILM: 1.0000 | ORAL_FILM | Freq: Two times a day (BID) | BUCCAL | 1 refills | Status: DC

## 2022-06-15 MED ORDER — BELBUCA 300 MCG BU FILM: 1.0000 | ORAL_FILM | Freq: Two times a day (BID) | BUCCAL | 0 refills | Status: DC

## 2022-06-15 MED ORDER — GABAPENTIN 400 MG PO CAPS: 400.0000 mg | ORAL_CAPSULE | Freq: Three times a day (TID) | ORAL | 2 refills | Status: DC

## 2022-06-15 NOTE — Progress Notes (Signed)
PROVIDER NOTE: Information contained herein reflects review and annotations entered in association with encounter. Interpretation of such information and data should be left to medically-trained personnel. Information provided to patient can be located elsewhere in the medical record under "Patient Instructions". Document created using STT-dictation technology, any transcriptional errors that may result from process are unintentional.    Patient: Judy Allen  Service Category: E/M  Provider: Gillis Santa, MD  DOB: 1955-10-02  DOS: 06/15/2022  Referring Provider: Steele Sizer, MD  MRN: [id number]  Specialty: Interventional Pain Management  PCP: Steele Sizer, MD  Type: Established Patient  Setting: Ambulatory outpatient    Location: Office  Delivery: Face-to-face     HPI  [name], a 66 y.o. year old female, is here today because of her Chronic pain syndrome [G89.4]. [name] primary complain today is Neck Pain (Left is worse ) Last encounter: My last encounter with her was on 04/27/22 Pertinent problems: [name] has Chronic recurrent major depressive disorder ([place]); GAD (generalized anxiety disorder); Myofascial pain syndrome, cervical; Chronic pain syndrome; Cervical facet joint syndrome; Cervical radiculopathy (left); and Osteoarthritis of spine with radiculopathy, cervical region on their pertinent problem list. Pain Assessment: Severity of Chronic pain is reported as a 6 /10. Location: Neck Left/into the left shoulder and down the left arm to the fingers. Onset: More than a month ago. Quality: Discomfort, Constant, Tingling, Sharp, Burning. Timing: Constant. Modifying factor(s): pain medications help a little bit.. Vitals:  height is _0  (1.626 m) and weight is 177 lb (80.3 kg). Her temporal temperature is 97.7 F (36.5 C). Her blood pressure is 152/94 (abnormal) and her pulse is 88. Her respiration is 16 and oxygen saturation is 93%.   Reason for encounter: medication  management.  Judy Allen follows up today for medication management.  She is having more difficulty managing her pain.  She states that the hydrocodone is less effective.  Her primary pain generator today is her neck pain with radiation into her left shoulder.  We discussed decreasing her hydrocodone for breakthrough pain only and adding belbuca for chronic baseline pain management.  From a respiratory standpoint, this will also be safer for the patient as she is on oxygen.  She has tried cervical epidural steroid injections as well as cervical RFA with limited response.  Trial of belbuca as below.    Pharmacotherapy Assessment  Analgesic: Addition of belbuca as below: Titration instructions provided, continue hydrocodone hydrocodone 7.5 mg  daily as needed for breakthrough pain    Monitoring:  PMP: PDMP reviewed during this encounter.       Pharmacotherapy: No side-effects or adverse reactions reported. Compliance: No problems identified. Effectiveness: Clinically acceptable.  Judy Billow, RN  06/15/2022  1:21 PM  Sign when Signing Visit Nursing Pain Medication Assessment:  Safety precautions to be maintained throughout the outpatient stay will include: orient to surroundings, keep bed in low position, maintain call bell within reach at all times, provide assistance with transfer out of bed and ambulation.  Medication Inspection Compliance: Pill count conducted under aseptic conditions, in front of the patient. Neither the pills nor the bottle was removed from the patient's sight at any time. Once count was completed pills were immediately returned to the patient in their original bottle.  Medication: Hydrocodone/APAP Pill/Patch Count:  29 of 90 pills remain Pill/Patch Appearance: Markings consistent with prescribed medication Bottle Appearance: Standard pharmacy container. Clearly labeled. Filled Date: 26 / 19 / 2023 Last Medication intake:  Today  No results found for:  "CBDTHCR" No results found for: "D8THCCBX" No results found for: "D9THCCBX"  UDS:  Summary  Date Value Ref Range Status  11/03/2021 Note  Final    Comment:    ==================================================================== [employer] Select 13 (MW) ==================================================================== Test                             Result       Flag       Units  Drug Present and Declared for Prescription Verification   Tramadol                       >6098        EXPECTED   ng/mg creat   O-Desmethyltramadol            >6098        EXPECTED   ng/mg creat   N-Desmethyltramadol            3405         EXPECTED   ng/mg creat    Source of tramadol is a prescription medication. O-desmethyltramadol    and N-desmethyltramadol are expected metabolites of tramadol.  ==================================================================== Test                      Result    Flag   Units      Ref Range   Creatinine              82               mg/dL      >=20 ==================================================================== Declared Medications:  The flagging and interpretation on this report are based on the  following declared medications.  Unexpected results may arise from  inaccuracies in the declared medications.   **Note: The testing scope of this panel includes these medications:   Tramadol (Ultram)   **Note: The testing scope of this panel does not include the  following reported medications:   Albuterol (Ventolin HFA)  Fish Oil  Gabapentin (Neurontin)  Gemfibrozil (Lopid)  Hydrocortisone  Influenza Virus Vaccine (Fluzone)  Metoprolol (Toprol)  Omeprazole (Prilosec)  Telmisartan (Micardis)  Tizanidine (Zanaflex)  Torsemide (Demadex)  Umeclidinium (Anoro)  Venlafaxine (Effexor)  Vilanterol (Anoro)  Vitamin D2 (Drisdol) ==================================================================== For clinical consultation, please call [phone]. ====================================================================       ROS  Constitutional: Denies any fever or chills Gastrointestinal: No reported hemesis, hematochezia, vomiting, or acute GI distress Musculoskeletal:  Cervicalgia Neurological: No reported episodes of acute onset apraxia, aphasia, dysarthria, agnosia, amnesia, paralysis, loss of coordination, or loss of consciousness  Medication Review  Buprenorphine HCl, HYDROcodone-acetaminophen, Multiple Vitamins-Minerals, NIFEdipine, Oxygen-Helium, albuterol, busPIRone, cariprazine, gabapentin, metoprolol succinate, omega-3 acid ethyl esters, omeprazole, telmisartan, tiZANidine, torsemide, umeclidinium-vilanterol, and venlafaxine XR  History Review  Allergy: [name] is allergic to aczone [dapsone], atarax [hydroxyzine], hydrochlorothiazide, sulfa antibiotics, and sulfasalazine. Drug: [name]  reports no history of drug use. Alcohol:  reports no history of alcohol use. Tobacco:  reports that she quit smoking about 16 years ago. Her smoking use included cigarettes. She started smoking about 58 years ago. She has a 32.00 pack-year smoking history. She has never used smokeless tobacco. Social: [name]  reports that she quit smoking about 16 years ago. Her smoking use included cigarettes. She started smoking about 58 years ago. She has a 32.00 pack-year smoking history. She has never used smokeless tobacco. She reports that  she does not drink alcohol and does not use drugs. Medical:  has a past medical history of Anal squamous cell carcinoma ([place]) (03/10/2016), Anemia, Anxiety, Aortic atherosclerosis ([place]), Arthritis, Complication of anesthesia, Constipation, COPD (chronic obstructive pulmonary disease) ([place]), Coronary artery calcification seen on CT scan, Depression, Diastolic dysfunction (30/16/0109), Emphysema of lung ([place]), GERD (gastroesophageal reflux disease), H/O allergic rhinitis, Heart murmur, Hemorrhoid, History  of fusion of cervical spine, Hyperlipidemia, Hyperparathyroidism ([place]), Hypertension, Macular degeneration, Methemoglobinemia (09/07/2016), Neck pain, chronic, Neuritis, OSA on CPAP (09/12/2016), Ovarian failure, Oxygen dependent, Personal history of chemotherapy, Personal history of radiation therapy, Pneumonitis (2017), PONV (postoperative nausea and vomiting), Proteinuria, Psoriasis, Pulmonary HTN ([place]) (06/22/2005), Steatohepatitis, T2DM (type 2 diabetes mellitus) ([place]), Tachycardia, paroxysmal ([place]), and Urinary incontinence. Surgical: [name]  has a past surgical history that includes Tubal ligation; Anterior fusion lumbar spine (1990); Mass excision (Left, 02/23/2016); Colonoscopy (2018); Portacath placement (Left, 03/12/2016); Cardiac catheterization (2009? ); Cardiac catheterization (05/2012); Port-a-cath removal (2018); Liver biopsy (N/A, 09/12/2017); Cholecystectomy (N/A, 09/12/2017); Trigger finger release (Right, 09/2018); Abdominal hysterectomy; Anterior cervical decomp/discectomy fusion (N/A, 2001); Tonsillectomy (Bilateral, 1959); Anterior and posterior repair (N/A, 12/28/2021); and Vaginal hysterectomy (N/A, 12/28/2021). Family: family history includes Asthma in her mother; Breast cancer in her maternal aunt; Cancer in her father; Heart attack in her mother; Liver cancer in her father.  Laboratory Chemistry Profile   Renal Lab Results  Component Value Date   BUN 12 12/28/2021   CREATININE 0.80 12/28/2021   LABCREA 113 07/22/2021   BCR 17 05/13/2021   GFRAA 75 04/23/2019   GFRNONAA >60 12/24/2021    Hepatic Lab Results  Component Value Date   AST 44 (H) 12/24/2021   ALT 49 (H) 12/24/2021   ALBUMIN 4.0 12/24/2021   ALKPHOS 91 12/24/2021   LIPASE 27 08/03/2017    Electrolytes Lab Results  Component Value Date   NA 142 12/28/2021   K 3.6 12/28/2021   CL 100 12/28/2021   CALCIUM 9.7 12/24/2021    Bone Lab Results  Component Value Date   VD25OH 57 07/22/2021     Inflammation (CRP: Acute Phase) (ESR: Chronic Phase) No results found for: "CRP", "ESRSEDRATE", "LATICACIDVEN"       Note: Above Lab results reviewed.  Recent Imaging Review  MM 3D SCREEN BREAST BILATERAL CLINICAL DATA:  Screening.  EXAM: DIGITAL SCREENING BILATERAL MAMMOGRAM WITH TOMOSYNTHESIS AND CAD  TECHNIQUE: Bilateral screening digital craniocaudal and mediolateral oblique mammograms were obtained. Bilateral screening digital breast tomosynthesis was performed. The images were evaluated with computer-aided detection.  COMPARISON:  Previous exam(s).  ACR Breast Density Category b: There are scattered areas of fibroglandular density.  FINDINGS: In the left breast, a possible mass warrants further evaluation. In the right breast, no findings suspicious for malignancy.  IMPRESSION: Further evaluation is suggested for a possible mass in the left breast.  RECOMMENDATION: Diagnostic mammogram and possibly ultrasound of the left breast. (Code:[id number])  The patient will be contacted regarding the findings, and additional imaging will be scheduled.  BI-RADS CATEGORY  0: Incomplete. Need additional imaging evaluation and/or prior mammograms for comparison.  Electronically Signed   By: Evangeline Dakin M.D.   On: 05/27/2022 13:38 Note: Reviewed        Physical Exam  General appearance: Well nourished, well developed, and well hydrated. In no apparent acute distress Mental status: Alert, oriented x 3 (person, place, & time)       Respiratory: No evidence of acute respiratory distress on home oxygen 2 L Eyes: PERLA Vitals: BP Marland Kitchen)  152/94 (BP Location: Right Arm, Patient Position: Sitting, Cuff Size: Large)   Pulse 88   Temp 97.7 F (36.5 C) (Temporal)   Resp 16   Ht _0  (1.626 m)   Wt 177 lb (80.3 kg)   LMP 03/04/2002 (Approximate)   SpO2 93% Comment: 2 liters  BMI 30.38 kg/m  BMI: Estimated body mass index is 30.38 kg/m as calculated from the following:    Height as of this encounter: _1  (1.626 m).   Weight as of this encounter: 177 lb (80.3 kg). Ideal: Ideal body weight: 54.7 kg (120 lb 9.5 oz) Adjusted ideal body weight: 64.9 kg (143 lb 2.5 oz)  Cervicalgia with pain radiation into bilateral upper extremity  Bilateral hip pain, buttock pain with occasional radiation into posterior lateral thigh  5 out of 5 strength bilateral lower extremity: Plantar flexion, dorsiflexion, knee flexion, knee extension.   Assessment   Diagnosis Status  1. Chronic pain syndrome   2. Cervical radiculopathy (left)   3. Lumbar radiculopathy   4. Chronic cervical pain   5. Chronic radicular lumbar pain   6. Disorder of SI (sacroiliac) joint     Increased pain Increased pain Controlled     Plan of Care  Problem-specific:  No problem-specific Assessment & Plan notes found for this encounter.  [name] has a current medication list which includes the following long-term medication(s): effexor xr, gabapentin, and omega-3 acid ethyl esters.  Pharmacotherapy (Medications Ordered): Meds ordered this encounter  Medications   HYDROcodone-acetaminophen (NORCO) 7.5-325 MG tablet    Sig: Take 1 tablet by mouth every 8 (eight) hours as needed for moderate pain.    Dispense:  90 tablet    Refill:  0   gabapentin (NEURONTIN) 400 MG capsule    Sig: Take 1 capsule (400 mg total) by mouth 3 (three) times daily.    Dispense:  90 capsule    Refill:  2   Buprenorphine HCl (BELBUCA) 300 MCG FILM    Sig: Place 1 Film inside cheek every 12 (twelve) hours.    Dispense:  60 each    Refill:  0   DISCONTD: Buprenorphine HCl (BELBUCA) 450 MCG FILM    Sig: Place 1 Film (450 mcg total) inside cheek every 12 (twelve) hours.    Dispense:  60 each    Refill:  1   Buprenorphine HCl (BELBUCA) 450 MCG FILM    Sig: Place 1 Film (450 mcg total) inside cheek every 12 (twelve) hours.    Dispense:  60 each    Refill:  1    Follow-up plan:   Return in about 3  months (around 09/15/2022) for Medication Management, in person.    Recent Visits Date Type Provider Dept  04/27/22 Office Visit Gillis Santa, MD [facility]  Showing recent visits within past 90 days and meeting all other requirements Today's Visits Date Type Provider Dept  06/15/22 Office Visit Gillis Santa, MD [facility]  Showing today's visits and meeting all other requirements Future Appointments Date Type Provider Dept  09/09/22 Appointment Gillis Santa, MD [facility]  Showing future appointments within next 90 days and meeting all other requirements  I discussed the assessment and treatment plan with the patient. The patient was provided an opportunity to ask questions and all were answered. The patient agreed with the plan and demonstrated an understanding of the instructions.  Patient advised to call back or seek an in-person evaluation if the symptoms or condition  worsens.  Duration of encounter: 42mnutes.  Total time on encounter, as per AMA guidelines included both the face-to-face and non-face-to-face time personally spent by the physician and/or other qualified health care professional(s) on the day of the encounter (includes time in activities that require the physician or other qualified health care professional and does not include time in activities normally performed by clinical staff). Physician's time may include the following activities when performed: preparing to see the patient (eg, review of tests, pre-charting review of records) obtaining and/or reviewing separately obtained history performing a medically appropriate examination and/or evaluation counseling and educating the patient/family/caregiver ordering medications, tests, or procedures referring and communicating with other health care professionals (when not separately reported) documenting clinical information in the electronic or other health record independently  interpreting results (not separately reported) and communicating results to the patient/ family/caregiver care coordination (not separately reported)  Note by: BGillis Santa MD Date: 06/15/2022; Time: 1:46 PM

## 2022-06-15 NOTE — Telephone Encounter (Signed)
Rx request sent to Dr. Holley Raring

## 2022-06-15 NOTE — Progress Notes (Signed)
Nursing Pain Medication Assessment:  Safety precautions to be maintained throughout the outpatient stay will include: orient to surroundings, keep bed in low position, maintain call bell within reach at all times, provide assistance with transfer out of bed and ambulation.  Medication Inspection Compliance: Pill count conducted under aseptic conditions, in front of the patient. Neither the pills nor the bottle was removed from the patient's sight at any time. Once count was completed pills were immediately returned to the patient in their original bottle.  Medication: Hydrocodone/APAP Pill/Patch Count:  29 of 90 pills remain Pill/Patch Appearance: Markings consistent with prescribed medication Bottle Appearance: Standard pharmacy container. Clearly labeled. Filled Date: 49 / 19 / 2023 Last Medication intake:  Today

## 2022-06-16 ENCOUNTER — Ambulatory Visit
Admission: RE | Admit: 2022-06-16 | Discharge: 2022-06-16 | Disposition: A | Discharge status: Home / Self Care | Payer: Medicare HMO | Source: Ambulatory Visit | Attending: Family Medicine | Admitting: Family Medicine

## 2022-06-16 ENCOUNTER — Encounter: Payer: Self-pay | Admitting: Family Medicine

## 2022-06-16 DIAGNOSIS — N63 Unspecified lump in unspecified breast: Secondary | ICD-10-CM | POA: Diagnosis not present

## 2022-06-16 DIAGNOSIS — R928 Other abnormal and inconclusive findings on diagnostic imaging of breast: Secondary | ICD-10-CM | POA: Insufficient documentation

## 2022-06-16 DIAGNOSIS — R92322 Mammographic fibroglandular density, left breast: Secondary | ICD-10-CM | POA: Diagnosis not present

## 2022-06-16 DIAGNOSIS — N6324 Unspecified lump in the left breast, lower inner quadrant: Secondary | ICD-10-CM | POA: Diagnosis not present

## 2022-06-17 ENCOUNTER — Other Ambulatory Visit: Payer: Self-pay | Admitting: Family Medicine

## 2022-06-17 DIAGNOSIS — N63 Unspecified lump in unspecified breast: Secondary | ICD-10-CM

## 2022-06-17 DIAGNOSIS — R928 Other abnormal and inconclusive findings on diagnostic imaging of breast: Secondary | ICD-10-CM

## 2022-06-23 ENCOUNTER — Encounter: Payer: Self-pay | Admitting: *Deleted

## 2022-06-23 ENCOUNTER — Encounter: Payer: Self-pay | Admitting: Student in an Organized Health Care Education/Training Program

## 2022-06-23 ENCOUNTER — Other Ambulatory Visit: Payer: Self-pay | Admitting: Family Medicine

## 2022-06-23 ENCOUNTER — Encounter: Payer: Self-pay | Admitting: Family Medicine

## 2022-06-23 DIAGNOSIS — F339 Major depressive disorder, recurrent, unspecified: Secondary | ICD-10-CM

## 2022-06-23 MED ORDER — CARIPRAZINE HCL 1.5 MG PO CAPS: 1.5000 mg | ORAL_CAPSULE | Freq: Every day | ORAL | 0 refills | Status: DC

## 2022-06-28 DIAGNOSIS — I071 Rheumatic tricuspid insufficiency: Secondary | ICD-10-CM | POA: Diagnosis not present

## 2022-06-28 DIAGNOSIS — I272 Pulmonary hypertension, unspecified: Secondary | ICD-10-CM | POA: Diagnosis not present

## 2022-06-30 ENCOUNTER — Telehealth: Payer: Self-pay | Admitting: Student in an Organized Health Care Education/Training Program

## 2022-06-30 NOTE — Telephone Encounter (Signed)
Maggie at [facility] in [place] needs to verify instructions on [employer]. Patient wants to pick up today. Please call asap

## 2022-06-30 NOTE — Telephone Encounter (Signed)
Spoke with Dr. Holley Raring, ok for starting dose of Buprenorphine 300 mcg. [place] notified.

## 2022-07-04 DIAGNOSIS — G4734 Idiopathic sleep related nonobstructive alveolar hypoventilation: Secondary | ICD-10-CM | POA: Diagnosis not present

## 2022-07-04 DIAGNOSIS — G4733 Obstructive sleep apnea (adult) (pediatric): Secondary | ICD-10-CM | POA: Diagnosis not present

## 2022-07-05 ENCOUNTER — Ambulatory Visit
Admission: RE | Admit: 2022-07-05 | Discharge: 2022-07-05 | Disposition: A | Discharge status: Home / Self Care | Payer: Medicare HMO | Source: Ambulatory Visit | Attending: Family Medicine | Admitting: Family Medicine

## 2022-07-05 DIAGNOSIS — R928 Other abnormal and inconclusive findings on diagnostic imaging of breast: Secondary | ICD-10-CM | POA: Insufficient documentation

## 2022-07-05 DIAGNOSIS — C50912 Malignant neoplasm of unspecified site of left female breast: Secondary | ICD-10-CM | POA: Diagnosis not present

## 2022-07-05 DIAGNOSIS — N63 Unspecified lump in unspecified breast: Secondary | ICD-10-CM | POA: Diagnosis not present

## 2022-07-05 DIAGNOSIS — N6324 Unspecified lump in the left breast, lower inner quadrant: Secondary | ICD-10-CM | POA: Diagnosis not present

## 2022-07-05 HISTORY — PX: BREAST BIOPSY: SHX20

## 2022-07-05 MED ORDER — LIDOCAINE-EPINEPHRINE 1 %-1:100000 IJ SOLN
5.0000 mL | Freq: Once | INTRAMUSCULAR | Status: AC
  Administered 2022-07-05: 5 mL

## 2022-07-05 MED ORDER — LIDOCAINE HCL (PF) 1 % IJ SOLN
3.0000 mL | Freq: Once | INTRAMUSCULAR | Status: AC
  Administered 2022-07-05: 3 mL

## 2022-07-07 ENCOUNTER — Encounter: Payer: Self-pay | Admitting: *Deleted

## 2022-07-07 ENCOUNTER — Encounter: Payer: Self-pay | Admitting: Family Medicine

## 2022-07-07 DIAGNOSIS — C50919 Malignant neoplasm of unspecified site of unspecified female breast: Secondary | ICD-10-CM

## 2022-07-07 NOTE — Progress Notes (Signed)
Received referral for newly diagnosed breast cancer from [facility].  Navigation initiated.  Med Onc and Surgical consults scheduled.  She will see Dr. Peyton Najjar and Dr. Tasia Catchings on Friday 12/1.

## 2022-07-08 ENCOUNTER — Other Ambulatory Visit: Payer: Self-pay | Admitting: Family Medicine

## 2022-07-09 ENCOUNTER — Encounter: Payer: Self-pay | Admitting: Oncology

## 2022-07-09 ENCOUNTER — Inpatient Hospital Stay: Payer: Medicare HMO | Attending: Oncology | Admitting: Oncology

## 2022-07-09 ENCOUNTER — Inpatient Hospital Stay: Payer: Medicare HMO

## 2022-07-09 ENCOUNTER — Encounter: Payer: Self-pay | Admitting: *Deleted

## 2022-07-09 VITALS — BP 106/62 | HR 88 | Temp 99.1°F | Resp 18 | Wt 182.0 lb

## 2022-07-09 DIAGNOSIS — C50919 Malignant neoplasm of unspecified site of unspecified female breast: Secondary | ICD-10-CM

## 2022-07-09 DIAGNOSIS — C50312 Malignant neoplasm of lower-inner quadrant of left female breast: Secondary | ICD-10-CM | POA: Diagnosis not present

## 2022-07-09 DIAGNOSIS — J432 Centrilobular emphysema: Secondary | ICD-10-CM | POA: Diagnosis not present

## 2022-07-09 DIAGNOSIS — Z170 Estrogen receptor positive status [ER+]: Secondary | ICD-10-CM | POA: Insufficient documentation

## 2022-07-09 DIAGNOSIS — Z853 Personal history of malignant neoplasm of breast: Secondary | ICD-10-CM | POA: Diagnosis not present

## 2022-07-09 DIAGNOSIS — Z85048 Personal history of other malignant neoplasm of rectum, rectosigmoid junction, and anus: Secondary | ICD-10-CM | POA: Diagnosis not present

## 2022-07-09 DIAGNOSIS — I270 Primary pulmonary hypertension: Secondary | ICD-10-CM | POA: Diagnosis not present

## 2022-07-09 DIAGNOSIS — C210 Malignant neoplasm of anus, unspecified: Secondary | ICD-10-CM

## 2022-07-09 DIAGNOSIS — C211 Malignant neoplasm of anal canal: Secondary | ICD-10-CM | POA: Diagnosis not present

## 2022-07-09 DIAGNOSIS — R0902 Hypoxemia: Secondary | ICD-10-CM | POA: Diagnosis not present

## 2022-07-09 DIAGNOSIS — G4733 Obstructive sleep apnea (adult) (pediatric): Secondary | ICD-10-CM | POA: Diagnosis not present

## 2022-07-09 DIAGNOSIS — Z7189 Other specified counseling: Secondary | ICD-10-CM

## 2022-07-09 LAB — COMPREHENSIVE METABOLIC PANEL
ALT: 82 U/L — ABNORMAL HIGH (ref 0–44)
AST: 75 U/L — ABNORMAL HIGH (ref 15–41)
Albumin: 4.1 g/dL (ref 3.5–5.0)
Alkaline Phosphatase: 95 U/L (ref 38–126)
Anion gap: 9 (ref 5–15)
BUN: 17 mg/dL (ref 8–23)
CO2: 29 mmol/L (ref 22–32)
Calcium: 9.3 mg/dL (ref 8.9–10.3)
Chloride: 102 mmol/L (ref 98–111)
Creatinine, Ser: 0.85 mg/dL (ref 0.44–1.00)
GFR, Estimated: >60 mL/min (ref >60)
Glucose, Bld: 111 mg/dL — ABNORMAL HIGH (ref 70–99)
Potassium: 4.4 mmol/L (ref 3.5–5.1)
Sodium: 140 mmol/L (ref 135–145)
Total Bilirubin: 0.5 mg/dL (ref 0.3–1.2)
Total Protein: 7.8 g/dL (ref 6.5–8.1)

## 2022-07-09 LAB — CBC
HCT: 37.8 % (ref 36.0–46.0)
Hemoglobin: 12.4 g/dL (ref 12.0–15.0)
MCH: 30 pg (ref 26.0–34.0)
MCHC: 32.8 g/dL (ref 30.0–36.0)
MCV: 91.3 fL (ref 80.0–100.0)
Platelets: 276 10*3/uL (ref 150–400)
RBC: 4.14 MIL/uL (ref 3.87–5.11)
RDW: 14.7 % (ref 11.5–15.5)
WBC: 9.1 10*3/uL (ref 4.0–10.5)
nRBC: 0 % (ref 0.0–0.2)

## 2022-07-09 NOTE — Progress Notes (Signed)
Accompanied patient and family to initial medical oncology appointment.   Reviewed Breast Cancer treatment handbook.   Care plan summary given to patient.   Reviewed outreach programs and cancer center services.   

## 2022-07-09 NOTE — Progress Notes (Unsigned)
Patient here for initial oncology appointment, concerns of COPD symptoms

## 2022-07-10 ENCOUNTER — Encounter: Payer: Self-pay | Admitting: Oncology

## 2022-07-10 DIAGNOSIS — C50919 Malignant neoplasm of unspecified site of unspecified female breast: Secondary | ICD-10-CM | POA: Insufficient documentation

## 2022-07-10 DIAGNOSIS — Z7189 Other specified counseling: Secondary | ICD-10-CM

## 2022-07-10 DIAGNOSIS — C50912 Malignant neoplasm of unspecified site of left female breast: Secondary | ICD-10-CM | POA: Insufficient documentation

## 2022-07-10 HISTORY — DX: Other specified counseling: Z71.89

## 2022-07-10 HISTORY — DX: Malignant neoplasm of unspecified site of left female breast: C50.912

## 2022-07-10 NOTE — Assessment & Plan Note (Addendum)
  Pathology and radiology counseling: Discussed with the patient, the details of pathology including the type of breast cancer,the clinical staging, the significance of ER, PR and HER-2/neu receptors and the implications for treatment. After reviewing the pathology in detail, we proceeded to discuss the different treatment options between surgery, radiation, chemotherapy, antiestrogen therapies. Recommend to obtain left axillary [place].   If HER 2 FISH is negative I recommend  1.  Breast conserving surgery with sentinel lymph node biopsy 2.  Oncotype DX testing on the surgical specimen to determine if she would benefit from adjuvant chemo 3.  Adjuvant radiation 4.  Adjuvant antiestrogen therapy  Return to clinic based upon surgery pathology and Oncotype DX test result

## 2022-07-10 NOTE — Progress Notes (Signed)
Hematology/Oncology Consult Note [phone] [phone]     REFERRING PROVIDER: Steele Sizer, MD   Patient Care Team: Steele Sizer, MD as PCP - General (Family Medicine) Lucilla Lame, MD as Consulting Physician (Gastroenterology) Carolin Coy, MD as Radiation Oncologist (Radiation Oncology) Gabriel Carina Betsey Holiday, MD as Physician Assistant (Endocrinology) Jimmye Norman, MD (Inactive) as Resident (Pulmonary Disease) Franciso Bend, MD (Cardiology) Daiva Huge, RN as Oncology Nurse Navigator  CHIEF COMPLAINTS/PURPOSE OF CONSULTATION:  Left breast invasive cancer   HISTORY OF PRESENTING ILLNESS:  Judy Allen 66 y.o. female presents to establish care for left breast invasive cancer.  I have reviewed her chart and materials related to her cancer extensively and collaborated history with the patient. Summary of oncologic history is as follows: Oncology History  Anal squamous cell carcinoma ([place])  03/09/2016 Initial Diagnosis   Anal squamous cell carcinoma   -Stage IIIb Squamous Cell Carcinoma of the Anus: Initially cT2 N2 M0 with left inguinal node involvement. Notably was HIV negative. Completed definitive chemoradiation with concurrent 5FU/mitomycin 05/21/16, for curative intent.     Invasive carcinoma of breast ([place])  05/27/2022 Mammogram   In the left breast, a possible mass warrants further evaluation. In the right breast, no findings suspicious for malignancy. Further evaluation is suggested for a possible mass in the left breast.   06/16/2022 Mammogram   Unilateral left diagnostic mammogram  There is an indeterminate 7 mm mass in the LEFT breast at 8 o'clock 5 cm from the nipple which is favored to correspond to the site of screening mammographic concern. Recommend ultrasound-guided biopsy for definitive characterization with attention on post marker placement mammogram to assess for mammographic/sonographic correlation.   07/05/2022 Initial  Diagnosis   Invasive carcinoma of breast ([place])  -left breast lesion core biopsy Showed invasive mammary carcinoma, no special type.  Grade 1, DCIS not identified.  LVI not identified, ER/PR 90% positive, HER2 equivocal   07/09/2022 Cancer Staging   Staging form: Breast, AJCC 8th Edition - Clinical stage from 07/09/2022: Stage IA (cT1b, cN0, cM0, G1, ER+, PR+, HER2: Equivocal) - Signed by Earlie Server, MD on 07/10/2022 Stage prefix: Initial diagnosis Histologic grading system: 3 grade system    Menarche 66 years of age Patient has 3 children. Age at first childbirth 89 years old, Previous OCP use less than 5 years Postmenopausal, last menstrual period July 2003-age of 45, Patient has had a hysterectomy. She has a history of brief hormone replacement therapy, 2 weeks, discontinued due to intolerance. No previous breast biopsies.  Family history positive for maternal aunt with breast cancer.  Father with liver cancer. Patient has history of chronic respiratory failure due to pulmonary hypertension.  Patient is on nasal cannula oxygen.  She follows up with [facility] pulmonology  MEDICAL HISTORY:  Past Medical History:  Diagnosis Date   Anal squamous cell carcinoma ([place]) 03/10/2016   a.) clinical stage IIIB (T2, N2, M0); Tx'd with chemotherapy + XRT   Anemia    Anxiety    Aortic atherosclerosis (HCC)    Arthritis    Complication of anesthesia    a.) PONV   Constipation    COPD (chronic obstructive pulmonary disease) (HCC)    Coronary artery calcification seen on CT scan    Depression    Diastolic dysfunction 03/70/4888   a.) TTE 06/08/2021: EF 55-60%; mild MR; PASP 47 mmHg; G1DD.   Emphysema of lung (HCC)    GERD (gastroesophageal reflux disease)    H/O allergic rhinitis  Heart murmur    Hemorrhoid    History of fusion of cervical spine    a.) s/p ACDF C4-C7   Hyperlipidemia    Hyperparathyroidism ([place])    Hypertension    Macular degeneration    Methemoglobinemia 09/07/2016    Neck pain, chronic    Neuritis    OSA on CPAP 09/12/2016   Ovarian failure    Oxygen dependent    a.) 2 L/[place] (day) with increase to 4 L/[place] (night)   Personal history of chemotherapy    Personal history of radiation therapy    Pneumonitis 2017   a.) related to [place] administration for squamous cell anal cancer   PONV (postoperative nausea and vomiting)    Proteinuria    Psoriasis    Pulmonary HTN ([place]) 06/22/2005   a.) TTE 06/22/2005: EF 61%; RVSP 31. b.) TTE 12/18/2009: EF 57%; RVSP 19.9. c.) TTE 05/20/2016: EF >55%; PASP 43. d.) TTE 04/07/2017: EF >55%; PASP 26. e.) TTE 03/12/2018: EF >55%; PASP 26. f.) TTE 06/08/2021: EF 55-60%; PASP 47.   Steatohepatitis    a.) grade II (moderate) on 09/2017 Bx   T2DM (type 2 diabetes mellitus) (HCC)    Tachycardia, paroxysmal (HCC)    Urinary incontinence     SURGICAL HISTORY: Past Surgical History:  Procedure Laterality Date   ABDOMINAL HYSTERECTOMY     ANTERIOR AND POSTERIOR REPAIR N/A 12/28/2021   Procedure: ANTERIOR (CYSTOCELE) AND POSTERIOR REPAIR (RECTOCELE);  Surgeon: Rubie Maid, MD;  Location: [facility] ORS;  Service: Gynecology;  Laterality: N/A;   ANTERIOR CERVICAL DECOMP/DISCECTOMY FUSION N/A 2001   PROCEDURE: ACDF C4-C7   ANTERIOR FUSION LUMBAR SPINE  1990   plate in back, not metal   BREAST BIOPSY Left 07/05/2022   [place] Bx, Ribbon Clip, Path Pending   BREAST BIOPSY Left 07/05/2022   [place] LT BREAST BX W LOC DEV 1ST LESION IMG BX SPEC US GUIDE 07/05/2022 [facility]   CARDIAC CATHETERIZATION  2009?    [facility]   CARDIAC CATHETERIZATION  05/2012   RHC: Mild hypertension 37/12 with a mean pressure of 21 mm mercury   CHOLECYSTECTOMY N/A 09/12/2017   Procedure: LAPAROSCOPIC CHOLECYSTECTOMY WITH INTRAOPERATIVE CHOLANGIOGRAM;  Surgeon: Robert Bellow, MD;  Location: [facility] OR[facility];  Service: General;  Laterality: N/A;   COLONOSCOPY  2018   LIVER BIOPSY N/A 09/12/2017   Procedure: LIVER BIOPSY;  Surgeon: Robert Bellow, MD;   Location: [facility] OR[facility];  Service: General;  Laterality: N/A;   MASS EXCISION Left 02/23/2016   Procedure: EXCISION MASS;  Surgeon: Christene Lye, MD;  Location: [facility] OR[facility];  Service: General;  Laterality: Left;   PORT-A-CATH REMOVAL  2018   PORTACATH PLACEMENT Left 03/12/2016   Procedure: INSERTION PORT-A-CATH;  Surgeon: Christene Lye, MD;  Location: [facility] OR[facility];  Service: General;  Laterality: Left;   TONSILLECTOMY Bilateral [place] Right 09/2018   Dr. Sabra Heck    TUBAL LIGATION     s/p   VAGINAL HYSTERECTOMY N/A 12/28/2021   Procedure: HYSTERECTOMY VAGINAL;  Surgeon: Rubie Maid, MD;  Location: [facility];  Service: Gynecology;  Laterality: N/A;    SOCIAL HISTORY: Social History   Socioeconomic History   Marital status: Widowed    Spouse name: Automotive engineer   Number of children: 3   Years of education: Not on file   Highest education level: 11th grade  Occupational History   Not on file  Tobacco Use   Smoking status: Former    Packs/day: 1.00  Years: 32.00    Total pack years: 32.00    Types: Cigarettes    Start date: 08/10/1963    Quit date: 01/15/2006    Years since quitting: 16.4   Smokeless tobacco: Never  Vaping Use   Vaping Use: Never used  Substance and Sexual Activity   Alcohol use: No   Drug use: No   Sexual activity: Not Currently    Birth control/protection: Post-menopausal  Other Topics Concern   Not on file  Social History Narrative   Husband passed away on 01-13-2018   She was living with [name] for few years, but moved in with Candace her oldest daughter this past Summer.    Social Determinants of Health   Financial Resource Strain: Low Risk  (08/06/2021)   Overall Financial Resource Strain (CARDIA)    Difficulty of Paying Living Expenses: Not very hard  Food Insecurity: No Food Insecurity (08/06/2021)   Hunger Vital Sign    Worried About Running Out of Food in the Last Year: Never true    Ran Out of Food in the Last  Year: Never true  Transportation Needs: No Transportation Needs (08/06/2021)   PRAPARE - Hydrologist (Medical): No    Lack of Transportation (Non-Medical): No  Physical Activity: Inactive (08/06/2021)   Exercise Vital Sign    Days of Exercise per Week: 0 days    Minutes of Exercise per Session: 0 min  Stress: No Stress Concern Present (08/06/2021)   [place]    Feeling of Stress : Not at all  Social Connections: Socially Isolated (08/06/2021)   Social Connection and Isolation Panel [NHANES]    Frequency of Communication with Friends and Family: More than three times a week    Frequency of Social Gatherings with Friends and Family: More than three times a week    Attends Religious Services: Never    Marine scientist or Organizations: No    Attends Archivist Meetings: Never    Marital Status: Widowed  Intimate Partner Violence: Not At Risk (08/06/2021)   Humiliation, Afraid, Rape, and Kick questionnaire    Fear of Current or Ex-Partner: No    Emotionally Abused: No    Physically Abused: No    Sexually Abused: No    FAMILY HISTORY: Family History  Problem Relation Age of Onset   Heart attack Mother    Asthma Mother    Liver cancer Father    Cancer Father    Breast cancer Maternal Aunt    Bladder Cancer Neg Hx    Kidney cancer Neg Hx    Colon cancer Neg Hx     ALLERGIES:  is allergic to aczone [dapsone], atarax [hydroxyzine], hydrochlorothiazide, sulfa antibiotics, and sulfasalazine.  MEDICATIONS:  Current Outpatient Medications  Medication Sig Dispense Refill   albuterol (PROVENTIL HFA;VENTOLIN HFA) 108 (90 Base) MCG/ACT inhaler Inhale 2 puffs into the lungs every 6 (six) hours as needed for wheezing or shortness of breath.     Buprenorphine HCl (BELBUCA) 300 MCG FILM Place 1 Film inside cheek every 12 (twelve) hours. 60 each 0   [START ON 07/15/2022]  Buprenorphine HCl (BELBUCA) 450 MCG FILM Place 1 Film (450 mcg total) inside cheek every 12 (twelve) hours. 60 each 1   busPIRone (BUSPAR) 7.5 MG tablet Take 1 tablet (7.5 mg total) by mouth 2 (two) times daily. 180 tablet 1   EFFEXOR XR 75 MG 24 hr capsule  TAKE 3 CAPSULES DAILY WITH BREAKFAST 270 capsule 1   gabapentin (NEURONTIN) 400 MG capsule Take 1 capsule (400 mg total) by mouth 3 (three) times daily. 90 capsule 2   HYDROcodone-acetaminophen (NORCO) 7.5-325 MG tablet Take 1 tablet by mouth every 8 (eight) hours as needed for moderate pain. 90 tablet 0   metoprolol succinate (TOPROL-XL) 50 MG 24 hr tablet Take 50 mg by mouth in the morning.     Multiple Vitamins-Minerals (EYE VITAMINS & MINERALS PO) Take 1 capsule by mouth in the morning. [employer] Eye Vitamins     NIFEdipine (PROCARDIA-XL/NIFEDICAL-XL) 30 MG 24 hr tablet Take 30 mg by mouth at bedtime.     omega-3 acid ethyl esters (LOVAZA) 1 g capsule Take 2 capsules (2 g total) by mouth 2 (two) times daily. 360 capsule 1   omeprazole (PRILOSEC) 40 MG capsule Take 40 mg by mouth daily.     OXYGEN Inhale 2-4 L into the lungs continuous. 2 L qd and 4 L qhs     telmisartan (MICARDIS) 80 MG tablet Take 80 mg by mouth at bedtime.     torsemide (DEMADEX) 5 MG tablet Take 5 mg by mouth every morning.     umeclidinium-vilanterol (ANORO ELLIPTA) 62.5-25 MCG/ACT AEPB Inhale 1 puff into the lungs in the morning.     tiZANidine (ZANAFLEX) 4 MG tablet Take 1 tablet (4 mg total) by mouth every 8 (eight) hours as needed. (Patient not taking: Reported on 07/09/2022) 270 tablet 2   No current facility-administered medications for this visit.    Review of Systems  Constitutional:  Negative for appetite change, chills, fatigue and fever.  HENT:   Negative for hearing loss and voice change.   Eyes:  Negative for eye problems.  Respiratory:  Positive for shortness of breath. Negative for chest tightness and cough.   Cardiovascular:  Negative for chest  pain.  Gastrointestinal:  Negative for abdominal distention, abdominal pain and blood in stool.  Endocrine: Negative for hot flashes.  Genitourinary:  Negative for difficulty urinating and frequency.   Musculoskeletal:  Negative for arthralgias.  Skin:  Negative for itching and rash.  Neurological:  Negative for extremity weakness.  Hematological:  Negative for adenopathy.  Psychiatric/Behavioral:  Negative for confusion.    Marland Kitchen    PHYSICAL EXAMINATION: ECOG PERFORMANCE STATUS: 1 - Symptomatic but completely ambulatory  Vitals:   07/09/22 1001  BP: 106/62  Pulse: 88  Resp: 18  Temp: 99.1 F (37.3 C)  SpO2: 92%   Filed Weights   07/09/22 1001  Weight: 82.6 kg    Physical Exam Constitutional:      General: She is not in acute distress.    Appearance: She is not diaphoretic.  HENT:     Head: Normocephalic and atraumatic.     Nose: Nose normal.     Mouth/Throat:     Pharynx: No oropharyngeal exudate.  Eyes:     General: No scleral icterus.    Pupils: Pupils are equal, round, and reactive to light.  Cardiovascular:     Rate and Rhythm: Normal rate and regular rhythm.     Heart sounds: No murmur heard. Pulmonary:     Effort: Pulmonary effort is normal. No respiratory distress.     Breath sounds: No rales.  Chest:     Chest wall: No tenderness.  Abdominal:     General: There is no distension.     Palpations: Abdomen is soft.     Tenderness: There is no abdominal tenderness.  Musculoskeletal:        General: Normal range of motion.     Cervical back: Normal range of motion and neck supple.  Skin:    General: Skin is warm and dry.     Findings: No erythema.  Neurological:     Mental Status: She is alert and oriented to person, place, and time.     Cranial Nerves: No cranial nerve deficit.     Motor: No abnormal muscle tone.     Coordination: Coordination normal.  Psychiatric:        Mood and Affect: Affect normal.   Breast exam was performed in seated and lying  down position. Patient is status post left breast biopsy, focal bruising at biopsy site. .  No palpable breast masses bilaterally.  No palpable axillary adenopathy bilaterally  LABORATORY DATA:  I have reviewed the data as listed    Latest Ref Rng & Units 07/09/2022   10:44 AM 12/28/2021    9:28 AM 12/24/2021    1:19 PM  CBC  WBC 4.0 - 10.5 K/uL 9.1   8.6   Hemoglobin 12.0 - 15.0 g/dL 12.4  12.6  13.1   Hematocrit 36.0 - 46.0 % 37.8  37.0  39.8   Platelets 150 - 400 K/uL 276   247       Latest Ref Rng & Units 07/09/2022   10:44 AM 12/28/2021    9:28 AM 12/24/2021    1:19 PM  CMP  Glucose 70 - 99 mg/dL 111  88  101   BUN 8 - 23 mg/dL _0 Creatinine 0.44 - 1.00 mg/dL 0.85  0.80  0.76   Sodium 135 - 145 mmol/L 140  142  142   Potassium 3.5 - 5.1 mmol/L 4.4  3.6  3.1   Chloride 98 - 111 mmol/L 102  100  100   CO2 22 - 32 mmol/L 29   30   Calcium 8.9 - 10.3 mg/dL 9.3   9.7   Total Protein 6.5 - 8.1 g/dL 7.8   7.7   Total Bilirubin 0.3 - 1.2 mg/dL 0.5   0.8   Alkaline Phos 38 - 126 U/L 95   91   AST 15 - 41 U/L 75   44   ALT 0 - 44 U/L 82   49      RADIOGRAPHIC STUDIES: I have personally reviewed the radiological images as listed and agreed with the findings in the report. [place] LT BREAST BX W LOC DEV 1ST LESION IMG BX SPEC US GUIDE  Addendum Date: 07/08/2022   ADDENDUM REPORT: 07/08/2022 15:49 ADDENDUM: PATHOLOGY revealed: A. Breast, LEFT, mass; core biopsies: - INVASIVE MAMMARY CARCINOMA, NO SPECIAL TYPE. Size of invasive carcinoma: 5.2 mm in this sample. Grade 1 (of 3). Ductal carcinoma in situ: Not identified. Lymphovascular invasion: Not identified. Pathology results are CONCORDANT with imaging findings, per Dr. Franki Cabot. Pathology results and recommendations were discussed with patient via telephone on 07/07/2022. Patient reported biopsy site doing well with no adverse symptoms, and only slight tenderness at the site. Post biopsy care instructions were reviewed,  questions were answered and my direct phone number was provided. Patient was instructed to call [facility] for any additional questions or concerns related to biopsy site. RECOMMENDATIONS: Surgical and oncological consultation. Request for surgical and oncological consultation relayed to Casper Harrison RN at [facility] by Electa Sniff RN on 07/07/2022. Pathology results reported by Electa Sniff RN  on 07/08/2022. Electronically Signed   By: Franki Cabot M.D.   On: 07/08/2022 15:49   Result Date: 07/08/2022 CLINICAL DATA:  Patient presents today for ultrasound-guided biopsy of a LEFT breast mass at the 8 o'clock axis. EXAM: ULTRASOUND GUIDED LEFT BREAST CORE NEEDLE BIOPSY COMPARISON:  Previous exam(s). PROCEDURE: I met with the patient and we discussed the procedure of ultrasound-guided biopsy, including benefits and alternatives. We discussed the high likelihood of a successful procedure. We discussed the risks of the procedure, including infection, bleeding, tissue injury, clip migration, and inadequate sampling. Informed written consent was given. The usual time-out protocol was performed immediately prior to the procedure. Lesion quadrant: Lower inner quadrant Using sterile technique and 1% Lidocaine as local anesthetic, under direct ultrasound visualization, a 12 gauge spring-loaded device was used to perform biopsy of the LEFT breast mass at the 8 o'clock axis using a inferolateral approach. At the conclusion of the procedure , a ribbon shaped tissue marker clip was deployed into the biopsy cavity. Follow up 2 view mammogram was performed and dictated separately. IMPRESSION: Ultrasound guided biopsy of the LEFT breast mass at the 8 o'clock axis. No apparent complications. Electronically Signed: By: Franki Cabot M.D. On: 07/05/2022 08:38   MM CLIP PLACEMENT LEFT  Result Date: 07/05/2022 CLINICAL DATA:  Status post ultrasound-guided biopsy of a LEFT breast mass at the 8 o'clock  axis. EXAM: 3D DIAGNOSTIC LEFT MAMMOGRAM POST ULTRASOUND BIOPSY COMPARISON:  Previous exam(s). FINDINGS: 3D Mammographic images were obtained following ultrasound guided biopsy of the LEFT breast mass at the 8 o'clock axis. The biopsy marking clip is in expected position at the site of biopsy. IMPRESSION: Appropriate positioning of the ribbon shaped biopsy marking clip at the site of biopsy in the lower inner quadrant of the LEFT breast, corresponding to the targeted mass at the 8 o'clock axis and corresponding to the initial mammographic finding. Final Assessment: Post Procedure Mammograms for Marker Placement Electronically Signed   By: Franki Cabot M.D.   On: 07/05/2022 08:49  MM DIAG BREAST TOMO UNI LEFT  Result Date: 06/16/2022 CLINICAL DATA:  Callback for LEFT breast mass EXAM: DIGITAL DIAGNOSTIC UNILATERAL LEFT MAMMOGRAM WITH TOMOSYNTHESIS; ULTRASOUND LEFT BREAST LIMITED TECHNIQUE: Left digital diagnostic mammography and breast tomosynthesis was performed.; Targeted ultrasound examination of the left breast was performed. COMPARISON:  Previous exam(s). ACR Breast Density Category b: There are scattered areas of fibroglandular density. FINDINGS: Spot compression tomosynthesis views confirm persistence of an oval mass in the LEFT inner breast at posterior depth. This is not definitively stable in comparison to remote prior mammograms. It is best seen on spot MLO slice 31 and spot CC slice 43. On physical exam, there is firm tissue in the LEFT inner breast. Targeted ultrasound was performed of the LEFT inner breast. At 8 o'clock 5 cm from nipple, there is an oval hypoechoic mass with indistinct margins. It measures approximately 7 x 5 x 6 mm. This is favored to correspond to the site of screening mammographic concern. IMPRESSION: 1. There is an indeterminate 7 mm mass in the LEFT breast at 8 o'clock 5 cm from the nipple which is favored to correspond to the site of screening mammographic concern. Recommend  ultrasound-guided biopsy for definitive characterization with attention on post marker placement mammogram to assess for mammographic/sonographic correlation. RECOMMENDATION: LEFT breast ultrasound-guided biopsy x1 I have discussed the findings and recommendations with the patient. The biopsy procedure was discussed with the patient and questions were answered. Patient expressed their understanding of the  biopsy recommendation. Patient will be scheduled for biopsy at her earliest convenience by the schedulers. Ordering provider will be notified. If applicable, a reminder letter will be sent to the patient regarding the next appointment. BI-RADS CATEGORY  4: Suspicious. Electronically Signed   By: Valentino Saxon M.D.   On: 06/16/2022 16:07  US BREAST LTD UNI LEFT INC AXILLA  Result Date: 06/16/2022 CLINICAL DATA:  Callback for LEFT breast mass EXAM: DIGITAL DIAGNOSTIC UNILATERAL LEFT MAMMOGRAM WITH TOMOSYNTHESIS; ULTRASOUND LEFT BREAST LIMITED TECHNIQUE: Left digital diagnostic mammography and breast tomosynthesis was performed.; Targeted ultrasound examination of the left breast was performed. COMPARISON:  Previous exam(s). ACR Breast Density Category b: There are scattered areas of fibroglandular density. FINDINGS: Spot compression tomosynthesis views confirm persistence of an oval mass in the LEFT inner breast at posterior depth. This is not definitively stable in comparison to remote prior mammograms. It is best seen on spot MLO slice 31 and spot CC slice 43. On physical exam, there is firm tissue in the LEFT inner breast. Targeted ultrasound was performed of the LEFT inner breast. At 8 o'clock 5 cm from nipple, there is an oval hypoechoic mass with indistinct margins. It measures approximately 7 x 5 x 6 mm. This is favored to correspond to the site of screening mammographic concern. IMPRESSION: 1. There is an indeterminate 7 mm mass in the LEFT breast at 8 o'clock 5 cm from the nipple which is favored to  correspond to the site of screening mammographic concern. Recommend ultrasound-guided biopsy for definitive characterization with attention on post marker placement mammogram to assess for mammographic/sonographic correlation. RECOMMENDATION: LEFT breast ultrasound-guided biopsy x1 I have discussed the findings and recommendations with the patient. The biopsy procedure was discussed with the patient and questions were answered. Patient expressed their understanding of the biopsy recommendation. Patient will be scheduled for biopsy at her earliest convenience by the schedulers. Ordering provider will be notified. If applicable, a reminder letter will be sent to the patient regarding the next appointment. BI-RADS CATEGORY  4: Suspicious. Electronically Signed   By: Valentino Saxon M.D.   On: 06/16/2022 16:07   ASSESSMENT & PLAN:   Invasive carcinoma of breast [facility])  Pathology and radiology counseling: Discussed with the patient, the details of pathology including the type of breast cancer,the clinical staging, the significance of ER, PR and HER-2/neu receptors and the implications for treatment. After reviewing the pathology in detail, we proceeded to discuss the different treatment options between surgery, radiation, chemotherapy, antiestrogen therapies. Recommend to obtain left axillary [place].   If HER 2 FISH is negative I recommend  1.  Breast conserving surgery with sentinel lymph node biopsy 2.  Oncotype DX testing on the surgical specimen to determine if she would benefit from adjuvant chemo 3.  Adjuvant radiation 4.  Adjuvant antiestrogen therapy  Return to clinic based upon surgery pathology and Oncotype DX test result   Anal squamous cell carcinoma ([place]) She is 6 years after chemoradiation.  observation   Goals of care, counseling/discussion Discussed with patient.     Orders Placed This Encounter  Procedures   [place] AXILLA LEFT    Standing Status:   Future    Standing Expiration  Date:   07/10/2023    Order Specific Question:   Reason for Exam (SYMPTOM  OR DIAGNOSIS REQUIRED)    Answer:   New Breast Cancer    Order Specific Question:   Preferred imaging location?    Answer:   [facility]  Standing Status:   Future    Number of Occurrences:   1    Standing Expiration Date:   07/09/2023   Comprehensive metabolic panel    Standing Status:   Future    Number of Occurrences:   1    Standing Expiration Date:   07/09/2023    Follow up to be determined.   All questions were answered. The patient knows to call the clinic with any problems, questions or concerns.  Earlie Server, MD 07/09/2022

## 2022-07-10 NOTE — Assessment & Plan Note (Signed)
Discussed with patient

## 2022-07-10 NOTE — Assessment & Plan Note (Addendum)
She is 6 years after chemoradiation.  observation

## 2022-07-12 ENCOUNTER — Ambulatory Visit
Admission: RE | Admit: 2022-07-12 | Discharge: 2022-07-12 | Disposition: A | Discharge status: Home / Self Care | Payer: Medicare HMO | Source: Ambulatory Visit | Attending: Oncology | Admitting: Oncology

## 2022-07-12 DIAGNOSIS — C50919 Malignant neoplasm of unspecified site of unspecified female breast: Secondary | ICD-10-CM | POA: Diagnosis not present

## 2022-07-15 ENCOUNTER — Encounter: Payer: Self-pay | Admitting: *Deleted

## 2022-07-15 ENCOUNTER — Other Ambulatory Visit: Payer: Self-pay | Admitting: Anatomic Pathology

## 2022-07-15 ENCOUNTER — Encounter: Payer: Self-pay | Admitting: Oncology

## 2022-07-15 LAB — SURGICAL PATHOLOGY

## 2022-07-15 NOTE — Progress Notes (Signed)
Patient called to ask about her HER2 Fish results and what they mean.  I explained it came back negative.  Secure chat sent to Dr. Peyton Najjar and Dr. Tasia Catchings to notify of negative result.

## 2022-07-16 ENCOUNTER — Other Ambulatory Visit: Payer: Self-pay | Admitting: General Surgery

## 2022-07-16 DIAGNOSIS — Z170 Estrogen receptor positive status [ER+]: Secondary | ICD-10-CM

## 2022-07-19 ENCOUNTER — Other Ambulatory Visit: Payer: Self-pay | Admitting: General Surgery

## 2022-07-19 DIAGNOSIS — Z170 Estrogen receptor positive status [ER+]: Secondary | ICD-10-CM

## 2022-07-20 ENCOUNTER — Ambulatory Visit: Payer: Self-pay | Admitting: General Surgery

## 2022-07-21 ENCOUNTER — Encounter
Admission: RE | Admit: 2022-07-21 | Discharge: 2022-07-21 | Disposition: A | Discharge status: Home / Self Care | Payer: Medicare HMO | Source: Ambulatory Visit | Attending: General Surgery | Admitting: General Surgery

## 2022-07-21 VITALS — Ht 64.0 in | Wt 180.0 lb

## 2022-07-21 DIAGNOSIS — E669 Obesity, unspecified: Secondary | ICD-10-CM

## 2022-07-21 DIAGNOSIS — Z01812 Encounter for preprocedural laboratory examination: Secondary | ICD-10-CM

## 2022-07-21 HISTORY — DX: Personal history of urinary calculi: Z87.442

## 2022-07-21 NOTE — Patient Instructions (Addendum)
Your procedure is scheduled on: Monday, December 18 Report to the Registration Desk on the 1st floor of the [facility] at the time you were given.  REMEMBER: Instructions that are not followed completely may result in serious medical risk, up to and including death; or upon the discretion of your surgeon and anesthesiologist your surgery may need to be rescheduled.  Do not eat food after midnight the night before surgery.  No gum chewing, lozengers or hard candies.  TAKE THESE MEDICATIONS THE MORNING OF SURGERY WITH A SIP OF WATER:  Buspirone Effexor Gabapentin Metoprolol Nifedipine Omeprazole (take one the night before and one on the morning of surgery - helps to prevent nausea after surgery.) Anoro ellipta inhaler  Use inhalers on the day of surgery and bring your albuterol inhaler to the hospital.  One week prior to surgery: Stop Anti-inflammatories (NSAIDS) such as Advil, Aleve, Ibuprofen, Motrin, Naproxen, Naprosyn and Aspirin based products such as Excedrin, Goodys Powder, BC Powder. Stop ANY OVER THE COUNTER supplements until after surgery. You may however, continue to take Tylenol if needed for pain up until the day of surgery.  No Alcohol for 24 hours before or after surgery.  No Smoking including e-cigarettes for 24 hours prior to surgery.  No chewable tobacco products for at least 6 hours prior to surgery.  No nicotine patches on the day of surgery.  Do not use any "recreational" drugs for at least a week prior to your surgery.  Please be advised that the combination of cocaine and anesthesia may have negative outcomes, up to and including death. If you test positive for cocaine, your surgery will be cancelled.  On the morning of surgery brush your teeth with toothpaste and water, you may rinse your mouth with mouthwash if you wish. Do not swallow any toothpaste or mouthwash.  Use CHG Soap as directed on instruction sheet.  Do not wear jewelry, make-up, hairpins,  clips or nail polish.  Do not wear lotions, powders, or perfumes.   Do not shave body from the neck down 48 hours prior to surgery just in case you cut yourself which could leave a site for infection.  Also, freshly shaved skin may become irritated if using the CHG soap.  Contact lenses, hearing aids and dentures may not be worn into surgery.  Do not bring valuables to the hospital. [facility] is not responsible for any missing/lost belongings or valuables.   Notify your doctor if there is any change in your medical condition (cold, fever, infection).  Wear comfortable clothing (specific to your surgery type) to the hospital.  After surgery, you can help prevent lung complications by doing breathing exercises.  Take deep breaths and cough every 1-2 hours. Your doctor may order a device called an Incentive Spirometer to help you take deep breaths.  If you are being discharged the day of surgery, you will not be allowed to drive home. You will need a responsible adult (18 years or older) to drive you home and stay with you that night.   If you are taking public transportation, you will need to have a responsible adult (18 years or older) with you. Please confirm with your physician that it is acceptable to use public transportation.   Please call the [facility] at [phone] if you have any questions about these instructions.  Surgery Visitation Policy:  Patients undergoing a surgery or procedure may have two family members or support persons with them as long as the person  is not COVID-19 positive or experiencing its symptoms.      Preparing for Surgery with CHLORHEXIDINE GLUCONATE (CHG) Soap  Chlorhexidine Gluconate (CHG) Soap  o An antiseptic cleaner that kills germs and bonds with the skin to continue killing germs even after washing  o Used for showering the night before surgery and morning of surgery  Before surgery, you can play an important role  by reducing the number of germs on your skin.  CHG (Chlorhexidine gluconate) soap is an antiseptic cleanser which kills germs and bonds with the skin to continue killing germs even after washing.  Please do not use if you have an allergy to CHG or antibacterial soaps. If your skin becomes reddened/irritated stop using the CHG.  1. Shower the NIGHT BEFORE SURGERY and the MORNING OF SURGERY with CHG soap.  2. If you choose to wash your hair, wash your hair first as usual with your normal shampoo.  3. After shampooing, rinse your hair and body thoroughly to remove the shampoo.  4. Use CHG as you would any other liquid soap. You can apply CHG directly to the skin and wash gently with a scrungie or a clean washcloth.  5. Apply the CHG soap to your body only from the neck down. Do not use on open wounds or open sores. Avoid contact with your eyes, ears, mouth, and genitals (private parts). Wash face and genitals (private parts) with your normal soap.  6. Wash thoroughly, paying special attention to the area where your surgery will be performed.  7. Thoroughly rinse your body with warm water.  8. Do not shower/wash with your normal soap after using and rinsing off the CHG soap.  9. Pat yourself dry with a clean towel.  10. Wear clean pajamas to bed the night before surgery.  12. Place clean sheets on your bed the night of your first shower and do not sleep with pets.  13. Shower again with the CHG soap on the day of surgery prior to arriving at the hospital.  14. Do not apply any deodorants/lotions/powders.  15. Please wear clean clothes to the hospital.

## 2022-07-22 ENCOUNTER — Ambulatory Visit
Admission: RE | Admit: 2022-07-22 | Discharge: 2022-07-22 | Disposition: A | Discharge status: Home / Self Care | Payer: Medicare HMO | Source: Ambulatory Visit | Attending: General Surgery | Admitting: General Surgery

## 2022-07-22 ENCOUNTER — Other Ambulatory Visit: Payer: Self-pay | Admitting: Family Medicine

## 2022-07-22 ENCOUNTER — Inpatient Hospital Stay: Admission: RE | Admit: 2022-07-22 | Payer: Medicare HMO | Source: Ambulatory Visit

## 2022-07-22 DIAGNOSIS — C50312 Malignant neoplasm of lower-inner quadrant of left female breast: Secondary | ICD-10-CM | POA: Diagnosis not present

## 2022-07-22 DIAGNOSIS — Z170 Estrogen receptor positive status [ER+]: Secondary | ICD-10-CM

## 2022-07-22 DIAGNOSIS — E781 Pure hyperglyceridemia: Secondary | ICD-10-CM

## 2022-07-22 HISTORY — PX: BREAST BIOPSY: SHX20

## 2022-07-22 MED ORDER — LIDOCAINE HCL (PF) 1 % IJ SOLN
5.0000 mL | Freq: Once | INTRAMUSCULAR | Status: AC
  Administered 2022-07-22: 5 mL via INTRADERMAL

## 2022-07-22 NOTE — Progress Notes (Signed)
Perioperative Services Pre-Admission/Anesthesia Testing    Date: 07/22/22  Name: Judy Allen MRN:   [id number]  Re: Pulmonary medicine recommendations for surgery  Planned Surgical Procedure(s):    Case: [id number] Date/Time: 07/26/22 1153   Procedure: PART MASTECTOMY,RADIO FREQUENCY LOCALIZER,AXILLARY SENTINEL NODE BIOPSY (Right)   Anesthesia type: General   Pre-op diagnosis: C50.312, Z17.0 Malignant neoplasm of lower-inner quadrant of lt breast in female, estrogen receptor positive   Location: [facility] OR ROOM 06 / [facility]   Surgeons: Herbert Pun, MD   Clinical Notes:  Patient scheduled for the above procedure on 07/26/2022 with Dr. Frann Rider, MD.  In preparation for procedure, surgeon has discussed case with primary pulmonary medicine provider Arta Silence, MD) for recommendations regarding her upcoming procedure.  Case is planned to last approximately 1.5 hours under general anesthesia. No chest or abdominal incisions are anticipated.  Pulmonary history reviewed: COPD (no recent exacerbations, reasonably well controlled on LAMA/LABA at last visit). She currently requires supplemental oxygen at 3 L/[place]. Sleep study showing OSA and persistent nocturnal hypoxemia. There were prior concerns about neuromuscular weakness. Patient also has a pulmonary hypertension history. PFTs within the last year showed improvement in obstruction likely due to pseudonormalization between her obstruction and scarring/destruction from emphysematous changes. DLCO severely reduced.   Per pulmonary medicine, "pulmonary medicine does not grant surgical clearance, but can help with assessing risk of post-surgical pulmonary complications and help with pre-surgical optimization".    Testing Results:  TRANSTHORACIC ECHOCARDIOGRAM performed on 06/28/2022 Normal left ventricular systolic function with an EF of 60-65% Left ventricular diastolic Doppler parameters consistent with  abnormal relaxation (G1DD). Normal right ventricular systolic function Borderline pulmonary HTN; TR max velocity 3.0 m/s; PASP: 39 mmHg Trivial MR Mild PR, TR Normal transvalvular gradients; no valvular stenosis No pericardial effusion  CT CHEST WITHOUT performed on 12/10/2021 Severe emphysema.  Bilateral inferior lung zone scarring, bronchiectasis, and atelectasis.  Mild coronary and aortic calcifications Hepatomegaly and steatosis.  POLYSOMNOGRAPHY (WITH CPAP) performed on 12/09/2021 This study is abnormal due to the presence of:  Hypoxemia -  in wake and sleep consistent with underlying pulmonary issues, improved with 4L supplemental oxygen and head of bed raised.  Obstructive sleep apnea - mild to moderate (AHI 8, RDI 20, both if which are underestimates due to the supplemental oxygen blunting the  desaturation associated with the respiratory events)- the patient should  be considered for therapy to promote airway patency such as CPAP titration, or surgery.   Low sleep efficiency - this may be related to the underlying possible delayed sleep phase (based upon reported routine schedule), medical issues, medication effect sleep apnea, or poor adjustment to the testing environment.   Plans and Recommendations:  Judy Allen is scheduled for PART MASTECTOMY,RADIO FREQUENCY LOCALIZER,AXILLARY SENTINEL NODE BIOPSY (Right) on 07/26/2022. Patient with oxygen dependency related to her underlying COPD, pulmonary HTN, chronic respiratory failure with hypoxia (requires supplemental oxygen), and OSAH diagnoses. In preparation for her surgery, case has been dicussed with her pulmonologist. Risk of pulmonary complications is intermediate (13.3%) per the [facility]) perioperative pulmonary risk index. Risk of postoperative respiratory failure is 0.37%. The following recommendations were received from pulmonary medicine:  Plan to admit her post-operatively for observation at least overnight to  monitor respiratory status. Needs to continue on controller therapies for COPD (LAMA/LABA) Abstain from smoking  Optimize anesthesia and postoperative pain, avoid narcotics whenever possible  Whenever possible avoid long-acting neuromuscular blockades during anesthesia Neuraxial blockades preferred to general anesthesia whenever possible (  lower risks of pneumonia and respiratory failure) Incentive spirometry/lung expansion maneuvers to prevent pneumonia, early physical therapy Optimize postoperative O2 saturation with goal saturation between 92-95% Pre-treat with scheduled albuterol nebs (and ipratropium if not able to continue LAMA component of her home meds) for wheezing, and continue after surgery  Do not recommend steroids as patient is not having COPD exacerbation  Recommend a pre-extubation VBG to assess CO2 retention given her OSA and component of neuromuscular weakness, and then a post-extubation VBG for the same reason May be wise to extubate to NIPPV, especially if patient is retaining CO2 and given underlying OSA seen on recent sleep study and prior concerns about neuromuscular weakness Cautious fluid balance with some concern for pulmonary hypertension   Honor Loh, MSN, APRN, FNP-C, [place]  Peri-operative Services Nurse Practitioner Phone: [phone] 07/22/22 9:50 AM  NOTE: This note has been prepared using Dragon dictation software. Despite my best ability to proofread, there is always the potential that unintentional transcriptional errors may still occur from this process.

## 2022-07-22 NOTE — Progress Notes (Signed)
Not seen, switched to in person visit

## 2022-07-23 ENCOUNTER — Telehealth: Payer: Medicare HMO | Admitting: Family Medicine

## 2022-07-23 ENCOUNTER — Other Ambulatory Visit: Payer: Self-pay | Admitting: Family Medicine

## 2022-07-23 DIAGNOSIS — F339 Major depressive disorder, recurrent, unspecified: Secondary | ICD-10-CM

## 2022-07-23 DIAGNOSIS — E1169 Type 2 diabetes mellitus with other specified complication: Secondary | ICD-10-CM

## 2022-07-23 NOTE — Telephone Encounter (Signed)
Copied from [facility] [id number]. Topic: Appointment Scheduling - Scheduling Inquiry for Clinic >> Jul 22, 2022 12:11 PM Sabas Sous wrote: Reason for CRM: Pt called and wants to change her appt tomorrow to a virtual. Please advise if this is allowed per PCP.

## 2022-07-26 ENCOUNTER — Other Ambulatory Visit: Payer: Self-pay

## 2022-07-26 ENCOUNTER — Encounter: Payer: Self-pay | Admitting: General Surgery

## 2022-07-26 ENCOUNTER — Encounter
Admission: RE | Disposition: A | Discharge status: Home / Self Care | Payer: Self-pay | Source: Home / Self Care | Attending: General Surgery

## 2022-07-26 ENCOUNTER — Encounter
Admission: RE | Admit: 2022-07-26 | Discharge: 2022-07-26 | Disposition: A | Discharge status: Home / Self Care | Payer: Medicare HMO | Source: Ambulatory Visit | Attending: General Surgery | Admitting: General Surgery

## 2022-07-26 ENCOUNTER — Ambulatory Visit: Payer: Medicare HMO | Admitting: Urgent Care

## 2022-07-26 ENCOUNTER — Ambulatory Visit
Admission: RE | Admit: 2022-07-26 | Discharge: 2022-07-26 | Disposition: A | Discharge status: Home / Self Care | Payer: Medicare HMO | Source: Ambulatory Visit | Attending: General Surgery | Admitting: General Surgery

## 2022-07-26 ENCOUNTER — Observation Stay
Admission: RE | Admit: 2022-07-26 | Discharge: 2022-07-27 | Disposition: A | Discharge status: Home / Self Care | Payer: Medicare HMO | Attending: General Surgery | Admitting: General Surgery

## 2022-07-26 DIAGNOSIS — Z87891 Personal history of nicotine dependence: Secondary | ICD-10-CM | POA: Diagnosis not present

## 2022-07-26 DIAGNOSIS — E119 Type 2 diabetes mellitus without complications: Secondary | ICD-10-CM | POA: Diagnosis not present

## 2022-07-26 DIAGNOSIS — Z01812 Encounter for preprocedural laboratory examination: Secondary | ICD-10-CM

## 2022-07-26 DIAGNOSIS — I10 Essential (primary) hypertension: Secondary | ICD-10-CM | POA: Insufficient documentation

## 2022-07-26 DIAGNOSIS — Z170 Estrogen receptor positive status [ER+]: Secondary | ICD-10-CM

## 2022-07-26 DIAGNOSIS — J449 Chronic obstructive pulmonary disease, unspecified: Secondary | ICD-10-CM | POA: Insufficient documentation

## 2022-07-26 DIAGNOSIS — C50312 Malignant neoplasm of lower-inner quadrant of left female breast: Secondary | ICD-10-CM | POA: Diagnosis not present

## 2022-07-26 DIAGNOSIS — R928 Other abnormal and inconclusive findings on diagnostic imaging of breast: Secondary | ICD-10-CM | POA: Diagnosis not present

## 2022-07-26 DIAGNOSIS — Z85048 Personal history of other malignant neoplasm of rectum, rectosigmoid junction, and anus: Secondary | ICD-10-CM | POA: Insufficient documentation

## 2022-07-26 DIAGNOSIS — Z79899 Other long term (current) drug therapy: Secondary | ICD-10-CM | POA: Insufficient documentation

## 2022-07-26 DIAGNOSIS — C50919 Malignant neoplasm of unspecified site of unspecified female breast: Secondary | ICD-10-CM | POA: Diagnosis present

## 2022-07-26 DIAGNOSIS — Z7984 Long term (current) use of oral hypoglycemic drugs: Secondary | ICD-10-CM | POA: Diagnosis not present

## 2022-07-26 DIAGNOSIS — E1169 Type 2 diabetes mellitus with other specified complication: Secondary | ICD-10-CM

## 2022-07-26 DIAGNOSIS — I2510 Atherosclerotic heart disease of native coronary artery without angina pectoris: Secondary | ICD-10-CM | POA: Diagnosis not present

## 2022-07-26 DIAGNOSIS — E669 Obesity, unspecified: Secondary | ICD-10-CM

## 2022-07-26 DIAGNOSIS — J9611 Chronic respiratory failure with hypoxia: Secondary | ICD-10-CM | POA: Diagnosis not present

## 2022-07-26 HISTORY — PX: PART MASTECTOMY,RADIO FREQUENCY LOCALIZER,AXILLARY SENTINEL NODE BIOPSY: SHX6901

## 2022-07-26 LAB — GLUCOSE, CAPILLARY
Glucose-Capillary: 125 mg/dL — ABNORMAL HIGH (ref 70–99)
Glucose-Capillary: 142 mg/dL — ABNORMAL HIGH (ref 70–99)

## 2022-07-26 SURGERY — PART MASTECTOMY,RADIO FREQUENCY LOCALIZER,AXILLARY SENTINEL NODE BIOPSY
Anesthesia: General | Site: Breast | Laterality: Left

## 2022-07-26 MED ORDER — SUCCINYLCHOLINE CHLORIDE 200 MG/10ML IV SOSY
PREFILLED_SYRINGE | INTRAVENOUS | Status: DC | PRN
  Administered 2022-07-26: 100 mg via INTRAVENOUS

## 2022-07-26 MED ORDER — NIFEDIPINE ER OSMOTIC RELEASE 30 MG PO TB24
30.0000 mg | ORAL_TABLET | Freq: Every day | ORAL | Status: DC
  Filled 2022-07-26: qty 1

## 2022-07-26 MED ORDER — MIDAZOLAM HCL 2 MG/2ML IJ SOLN
INTRAMUSCULAR | Status: AC
  Filled 2022-07-26: qty 2

## 2022-07-26 MED ORDER — SODIUM CHLORIDE 0.9 % IV SOLN: INTRAVENOUS | Status: DC

## 2022-07-26 MED ORDER — APREPITANT 40 MG PO CAPS
ORAL_CAPSULE | ORAL | Status: AC
  Filled 2022-07-26: qty 1

## 2022-07-26 MED ORDER — VITAMIN D 25 MCG (1000 UNIT) PO TABS
1000.0000 [IU] | ORAL_TABLET | Freq: Every day | ORAL | Status: DC
  Administered 2022-07-27: 1000 [IU] via ORAL
  Filled 2022-07-26: qty 1

## 2022-07-26 MED ORDER — CEFAZOLIN SODIUM-DEXTROSE 2-4 GM/100ML-% IV SOLN
2.0000 g | INTRAVENOUS | Status: AC
  Administered 2022-07-26: 2 g via INTRAVENOUS

## 2022-07-26 MED ORDER — SCOPOLAMINE 1 MG/3DAYS TD PT72
MEDICATED_PATCH | TRANSDERMAL | Status: AC
  Filled 2022-07-26: qty 1

## 2022-07-26 MED ORDER — FENTANYL CITRATE (PF) 100 MCG/2ML IJ SOLN
INTRAMUSCULAR | Status: DC | PRN
  Administered 2022-07-26: 50 ug via INTRAVENOUS

## 2022-07-26 MED ORDER — METOPROLOL SUCCINATE ER 50 MG PO TB24
50.0000 mg | ORAL_TABLET | Freq: Every day | ORAL | Status: DC
  Administered 2022-07-27: 50 mg via ORAL
  Filled 2022-07-26: qty 1

## 2022-07-26 MED ORDER — ACETAMINOPHEN 10 MG/ML IV SOLN
INTRAVENOUS | Status: DC | PRN
  Administered 2022-07-26: 1000 mg via INTRAVENOUS

## 2022-07-26 MED ORDER — UMECLIDINIUM-VILANTEROL 62.5-25 MCG/ACT IN AEPB: 1.0000 | INHALATION_SPRAY | Freq: Every day | RESPIRATORY_TRACT | Status: DC

## 2022-07-26 MED ORDER — POTASSIUM GLUCONATE 80 MG PO TABS: 1.0000 | ORAL_TABLET | Freq: Every day | ORAL | Status: DC

## 2022-07-26 MED ORDER — ONDANSETRON HCL 4 MG/2ML IJ SOLN: 4.0000 mg | Freq: Four times a day (QID) | INTRAMUSCULAR | Status: DC | PRN

## 2022-07-26 MED ORDER — FENTANYL CITRATE (PF) 100 MCG/2ML IJ SOLN
INTRAMUSCULAR | Status: AC
  Administered 2022-07-26: 25 ug via INTRAVENOUS
  Filled 2022-07-26: qty 2

## 2022-07-26 MED ORDER — IRBESARTAN 150 MG PO TABS
75.0000 mg | ORAL_TABLET | Freq: Every day | ORAL | Status: DC
  Filled 2022-07-26: qty 1

## 2022-07-26 MED ORDER — SEVOFLURANE IN SOLN
RESPIRATORY_TRACT | Status: AC
  Filled 2022-07-26: qty 250

## 2022-07-26 MED ORDER — DEXAMETHASONE SODIUM PHOSPHATE 10 MG/ML IJ SOLN
INTRAMUSCULAR | Status: DC | PRN
  Administered 2022-07-26: 10 mg via INTRAVENOUS

## 2022-07-26 MED ORDER — DROPERIDOL 2.5 MG/ML IJ SOLN: 0.6250 mg | Freq: Once | INTRAMUSCULAR | Status: DC | PRN

## 2022-07-26 MED ORDER — APREPITANT 40 MG PO CAPS
40.0000 mg | ORAL_CAPSULE | Freq: Once | ORAL | Status: AC
  Administered 2022-07-26: 40 mg via ORAL

## 2022-07-26 MED ORDER — ALBUTEROL SULFATE (2.5 MG/3ML) 0.083% IN NEBU: 2.5000 mg | INHALATION_SOLUTION | Freq: Four times a day (QID) | RESPIRATORY_TRACT | Status: DC | PRN

## 2022-07-26 MED ORDER — BUSPIRONE HCL 15 MG PO TABS
7.5000 mg | ORAL_TABLET | Freq: Two times a day (BID) | ORAL | Status: DC
  Administered 2022-07-26 – 2022-07-27 (×2): 7.5 mg via ORAL
  Filled 2022-07-26 (×2): qty 1

## 2022-07-26 MED ORDER — SUGAMMADEX SODIUM 500 MG/5ML IV SOLN
INTRAVENOUS | Status: DC | PRN
  Administered 2022-07-26: 200 mg via INTRAVENOUS

## 2022-07-26 MED ORDER — HYDROCODONE-ACETAMINOPHEN 7.5-325 MG PO TABS: 1.0000 | ORAL_TABLET | Freq: Three times a day (TID) | ORAL | Status: DC | PRN

## 2022-07-26 MED ORDER — ORAL CARE MOUTH RINSE: 15.0000 mL | Freq: Once | OROMUCOSAL | Status: DC

## 2022-07-26 MED ORDER — GLYCOPYRROLATE 0.2 MG/ML IJ SOLN
INTRAMUSCULAR | Status: DC | PRN
  Administered 2022-07-26: .2 mg via INTRAVENOUS

## 2022-07-26 MED ORDER — ONDANSETRON HCL 4 MG/2ML IJ SOLN
INTRAMUSCULAR | Status: DC | PRN
  Administered 2022-07-26 (×2): 4 mg via INTRAVENOUS

## 2022-07-26 MED ORDER — GABAPENTIN 400 MG PO CAPS
400.0000 mg | ORAL_CAPSULE | Freq: Three times a day (TID) | ORAL | Status: DC
  Administered 2022-07-26 – 2022-07-27 (×3): 400 mg via ORAL
  Filled 2022-07-26 (×3): qty 1

## 2022-07-26 MED ORDER — MORPHINE SULFATE (PF) 4 MG/ML IV SOLN
4.0000 mg | INTRAVENOUS | Status: DC | PRN
  Administered 2022-07-27: 4 mg via INTRAVENOUS
  Filled 2022-07-26: qty 1

## 2022-07-26 MED ORDER — LIDOCAINE HCL (CARDIAC) PF 100 MG/5ML IV SOSY
PREFILLED_SYRINGE | INTRAVENOUS | Status: DC | PRN
  Administered 2022-07-26: 100 mg via INTRAVENOUS

## 2022-07-26 MED ORDER — TORSEMIDE 10 MG PO TABS
5.0000 mg | ORAL_TABLET | Freq: Every day | ORAL | Status: DC
  Administered 2022-07-27: 5 mg via ORAL
  Filled 2022-07-26: qty 0.5

## 2022-07-26 MED ORDER — BUPIVACAINE-EPINEPHRINE (PF) 0.5% -1:200000 IJ SOLN
INTRAMUSCULAR | Status: DC | PRN
  Administered 2022-07-26: 30 mL

## 2022-07-26 MED ORDER — ENOXAPARIN SODIUM 40 MG/0.4ML IJ SOSY
40.0000 mg | PREFILLED_SYRINGE | INTRAMUSCULAR | Status: DC
  Administered 2022-07-27: 40 mg via SUBCUTANEOUS
  Filled 2022-07-26: qty 0.4

## 2022-07-26 MED ORDER — STERILE WATER FOR IRRIGATION IR SOLN
Status: DC | PRN
  Administered 2022-07-26: 400 mL

## 2022-07-26 MED ORDER — SCOPOLAMINE 1 MG/3DAYS TD PT72
MEDICATED_PATCH | TRANSDERMAL | Status: DC | PRN
  Administered 2022-07-26: 1 via TRANSDERMAL

## 2022-07-26 MED ORDER — VENLAFAXINE HCL ER 75 MG PO CP24
75.0000 mg | ORAL_CAPSULE | Freq: Every day | ORAL | Status: DC
  Administered 2022-07-27: 75 mg via ORAL
  Filled 2022-07-26: qty 1

## 2022-07-26 MED ORDER — CHLORHEXIDINE GLUCONATE 0.12 % MT SOLN: 15.0000 mL | Freq: Once | OROMUCOSAL | Status: DC

## 2022-07-26 MED ORDER — BUPRENORPHINE HCL 450 MCG BU FILM: 1.0000 | ORAL_FILM | Freq: Two times a day (BID) | BUCCAL | Status: DC

## 2022-07-26 MED ORDER — ONDANSETRON HCL 4 MG/2ML IJ SOLN: 4.0000 mg | Freq: Once | INTRAMUSCULAR | Status: DC | PRN

## 2022-07-26 MED ORDER — FENTANYL CITRATE (PF) 100 MCG/2ML IJ SOLN: 25.0000 ug | INTRAMUSCULAR | Status: DC | PRN

## 2022-07-26 MED ORDER — IPRATROPIUM-ALBUTEROL 0.5-2.5 (3) MG/3ML IN SOLN: 3.0000 mL | Freq: Four times a day (QID) | RESPIRATORY_TRACT | Status: DC | PRN

## 2022-07-26 MED ORDER — PROPOFOL 10 MG/ML IV BOLUS
INTRAVENOUS | Status: DC | PRN
  Administered 2022-07-26: 100 mg via INTRAVENOUS

## 2022-07-26 MED ORDER — ACETAMINOPHEN 160 MG/5ML PO SOLN: 325.0000 mg | ORAL | Status: DC | PRN

## 2022-07-26 MED ORDER — BUPIVACAINE-EPINEPHRINE (PF) 0.5% -1:200000 IJ SOLN
INTRAMUSCULAR | Status: AC
  Filled 2022-07-26: qty 30

## 2022-07-26 MED ORDER — PANTOPRAZOLE SODIUM 40 MG PO TBEC
40.0000 mg | DELAYED_RELEASE_TABLET | Freq: Every day | ORAL | Status: DC
  Administered 2022-07-27: 40 mg via ORAL
  Filled 2022-07-26: qty 1

## 2022-07-26 MED ORDER — BUPRENORPHINE HCL 300 MCG BU FILM: 1.0000 | ORAL_FILM | Freq: Two times a day (BID) | BUCCAL | Status: DC

## 2022-07-26 MED ORDER — ALBUTEROL SULFATE HFA 108 (90 BASE) MCG/ACT IN AERS: 2.0000 | INHALATION_SPRAY | Freq: Four times a day (QID) | RESPIRATORY_TRACT | Status: DC | PRN

## 2022-07-26 MED ORDER — UMECLIDINIUM-VILANTEROL 62.5-25 MCG/ACT IN AEPB: 1.0000 | INHALATION_SPRAY | Freq: Every morning | RESPIRATORY_TRACT | Status: DC

## 2022-07-26 MED ORDER — FENTANYL CITRATE (PF) 100 MCG/2ML IJ SOLN
INTRAMUSCULAR | Status: AC
  Filled 2022-07-26: qty 2

## 2022-07-26 MED ORDER — ROCURONIUM BROMIDE 100 MG/10ML IV SOLN
INTRAVENOUS | Status: DC | PRN
  Administered 2022-07-26: 10 mg via INTRAVENOUS
  Administered 2022-07-26: 20 mg via INTRAVENOUS
  Administered 2022-07-26: 40 mg via INTRAVENOUS

## 2022-07-26 MED ORDER — HYDROCODONE-ACETAMINOPHEN 7.5-325 MG PO TABS
1.0000 | ORAL_TABLET | Freq: Once | ORAL | Status: DC | PRN
  Filled 2022-07-26: qty 1

## 2022-07-26 MED ORDER — EPHEDRINE SULFATE (PRESSORS) 50 MG/ML IJ SOLN
INTRAMUSCULAR | Status: DC | PRN
  Administered 2022-07-26: 10 mg via INTRAVENOUS

## 2022-07-26 MED ORDER — ACETAMINOPHEN 10 MG/ML IV SOLN
INTRAVENOUS | Status: AC
  Filled 2022-07-26: qty 100

## 2022-07-26 MED ORDER — PHENYLEPHRINE HCL (PRESSORS) 10 MG/ML IV SOLN
INTRAVENOUS | Status: DC | PRN
  Administered 2022-07-26: 80 ug via INTRAVENOUS
  Administered 2022-07-26: 160 ug via INTRAVENOUS

## 2022-07-26 MED ORDER — TECHNETIUM TC 99M TILMANOCEPT KIT
1.0000 | PACK | Freq: Once | INTRAVENOUS | Status: AC | PRN
  Administered 2022-07-26: 1.003 via INTRADERMAL

## 2022-07-26 MED ORDER — ACETAMINOPHEN 325 MG PO TABS: 325.0000 mg | ORAL_TABLET | ORAL | Status: DC | PRN

## 2022-07-26 MED ORDER — HYDROCODONE-ACETAMINOPHEN 5-325 MG PO TABS
1.0000 | ORAL_TABLET | ORAL | Status: DC | PRN
  Administered 2022-07-26: 2 via ORAL
  Administered 2022-07-27: 1 via ORAL
  Administered 2022-07-27: 2 via ORAL
  Filled 2022-07-26 (×3): qty 2

## 2022-07-26 MED ORDER — MEPERIDINE HCL 25 MG/ML IJ SOLN: 6.2500 mg | INTRAMUSCULAR | Status: DC | PRN

## 2022-07-26 MED ORDER — ONDANSETRON 4 MG PO TBDP: 4.0000 mg | ORAL_TABLET | Freq: Four times a day (QID) | ORAL | Status: DC | PRN

## 2022-07-26 MED ORDER — METHYLENE BLUE 1 % INJ SOLN
INTRAVENOUS | Status: AC
  Filled 2022-07-26: qty 10

## 2022-07-26 SURGICAL SUPPLY — 36 items
BLADE SURG 15 STRL LF DISP TIS (BLADE) ×2 IMPLANT
BLADE SURG 15 STRL SS (BLADE) ×2
CHLORAPREP W/TINT 26 (MISCELLANEOUS) ×1 IMPLANT
COVER PROBE GAMMA FINDER SLV (MISCELLANEOUS) ×1 IMPLANT
DERMABOND ADVANCED .7 DNX12 (GAUZE/BANDAGES/DRESSINGS) ×1 IMPLANT
DEVICE DUBIN SPECIMEN MAMMOGRA (MISCELLANEOUS) ×1 IMPLANT
DRAPE LAPAROTOMY TRNSV 106X77 (MISCELLANEOUS) ×1 IMPLANT
DRSG GAUZE FLUFF 36X18 (GAUZE/BANDAGES/DRESSINGS) IMPLANT
ELECT CAUTERY BLADE 6.4 (BLADE) ×1 IMPLANT
ELECT REM PT RETURN 9FT ADLT (ELECTROSURGICAL) ×1
ELECTRODE REM PT RTRN 9FT ADLT (ELECTROSURGICAL) ×1 IMPLANT
GAUZE 4X4 16PLY ~~LOC~~+RFID DBL (SPONGE) ×1 IMPLANT
GLOVE BIO SURGEON STRL SZ 6.5 (GLOVE) ×1 IMPLANT
GLOVE BIOGEL PI IND STRL 6.5 (GLOVE) ×1 IMPLANT
GOWN STRL REUS W/ TWL LRG LVL3 (GOWN DISPOSABLE) ×2 IMPLANT
GOWN STRL REUS W/TWL LRG LVL3 (GOWN DISPOSABLE) ×2
KIT MARKER MARGIN INK (KITS) IMPLANT
KIT TURNOVER KIT A (KITS) ×1 IMPLANT
LABEL OR SOLS (LABEL) ×1 IMPLANT
MANIFOLD NEPTUNE II (INSTRUMENTS) ×1 IMPLANT
NEEDLE HYPO 22GX1.5 SAFETY (NEEDLE) ×1 IMPLANT
PACK BASIN MINOR ARMC (MISCELLANEOUS) ×1 IMPLANT
RETRACTOR RING XSMALL (MISCELLANEOUS) IMPLANT
RTRCTR WOUND ALEXIS 13CM XS SH (MISCELLANEOUS) ×1
SET LOCALIZER 20 PROBE US (MISCELLANEOUS) ×1 IMPLANT
SUT MNCRL 4-0 (SUTURE) ×1
SUT MNCRL 4-0 27XMFL (SUTURE) ×1
SUT VIC AB 3-0 SH 27 (SUTURE) ×2
SUT VIC AB 3-0 SH 27X BRD (SUTURE) ×2 IMPLANT
SUTURE MNCRL 4-0 27XMF (SUTURE) ×2 IMPLANT
SYR 10ML LL (SYRINGE) ×2 IMPLANT
SYR BULB IRRIG 60ML STRL (SYRINGE) ×1 IMPLANT
TRAP FLUID SMOKE EVACUATOR (MISCELLANEOUS) ×1 IMPLANT
TRAP NEPTUNE SPECIMEN COLLECT (MISCELLANEOUS) ×1 IMPLANT
WATER STERILE IRR 1000ML POUR (IV SOLUTION) ×1 IMPLANT
WATER STERILE IRR 500ML POUR (IV SOLUTION) ×1 IMPLANT

## 2022-07-26 NOTE — H&P (Signed)
PATIENT PROFILE: Judy Allen is a 66 y.o. female who presents to the Clinic for consultation at the request of Dr. Ancil Boozer for evaluation of left breast cancer.  PCP: Loistine Chance, MD  HISTORY OF PRESENT ILLNESS: Judy Allen reports patient had her usual screening mammogram she was found with possible mass of the left breast. This led to diagnostic mammogram. Diagnostic mammogram confirmed a 7 mm mass that was identified on breast ultrasound as well. She had core biopsy that showed invasive mammary carcinoma ER positive, PR positive, HER2 equivocal. FISH pending  Family history of breast cancer: Aunt Family history of other cancers: Father with liver cancer Menarche: 56 Menopause: 44 Used OCP: yes Number of pregnancies: 4 Age of first pregnancy: 27 Used estrogen and progesterone therapy: No History of Radiation to the chest: No Previous breast biopsy: None  PROBLEM LIST: Problem List Date Reviewed: 01/16/2020 None  GENERAL REVIEW OF SYSTEMS:  General ROS: negative for - chills, fatigue, fever, weight gain or weight loss Allergy and Immunology ROS: negative for - hives Hematological and Lymphatic ROS: negative for - bleeding problems or bruising, negative for palpable nodes Endocrine ROS: negative for - heat or cold intolerance, hair changes Respiratory ROS: negative for - cough, shortness of breath or wheezing Cardiovascular ROS: no chest pain or palpitations GI ROS: negative for nausea, vomiting, abdominal pain, diarrhea, constipation Musculoskeletal ROS: negative for - joint swelling or muscle pain Neurological ROS: negative for - confusion, syncope Dermatological ROS: negative for pruritus and rash Psychiatric: negative for anxiety, depression, difficulty sleeping and memory loss  MEDICATIONS: Current Outpatient Medications Medication Sig Dispense Refill albuterol 90 mcg/actuation inhaler Inhale 2 inhalations into the lungs every 6 (six) hours as needed for  Wheezing ANORO ELLIPTA 62.5-25 mcg/actuation inhaler Inhale 1 Puff into the lungs once daily [START ON 07/15/2022] buprenorphine HCL (BELBUCA) 450 mcg Film Place inside cheek 2 (two) times daily busPIRone (BUSPAR) 7.5 MG tablet Take 7.5 mg by mouth 2 (two) times daily cholecalciferol (VITAMIN D3) 1000 unit capsule Take 1,000 Units by mouth once daily gabapentin (NEURONTIN) 400 MG capsule Take 400 mg by mouth 3 (three) times daily HYDROcodone-acetaminophen (NORCO) 7.5-325 mg tablet Take 1 tablet by mouth 3 (three) times daily hydrocortisone 2.5 % cream Apply topically 2 (two) times daily metoprolol succinate (TOPROL-XL) 50 MG XL tablet Take 50 mg by mouth once daily multivitamin capsule Take 1 capsule by mouth once daily EYE PROMISE NIFEdipine (PROCARDIA-XL) 30 MG (OSM) XL tablet Take 30 mg by mouth at bedtime omega-3/dha/epa/fish oil (OMEGA-3 ORAL) Take 2 capsules by mouth 2 (two) times daily omeprazole (PRILOSEC) 40 MG DR capsule Take 40 mg by mouth once daily ondansetron (ZOFRAN-ODT) 4 MG disintegrating tablet Take 4 mg by mouth once daily as needed OXYGEN-AIR DELIVERY SYSTEMS MISC Use POTASSIUM ORAL Take 99 mcg by mouth once daily telmisartan (MICARDIS) 80 MG tablet Take 80 mg by mouth at bedtime tiZANidine (ZANAFLEX) 4 MG tablet Take 4 mg by mouth 3 (three) times daily TORsemide (DEMADEX) 5 MG tablet umeclidinium brm/vilanterol tr (ANORO ELLIPTA INHAL) Inhale 1 Puff/kg into the lungs once daily venlafaxine (EFFEXOR-XR) 150 MG XR capsule Take 150 mg by mouth once daily amLODIPine (NORVASC) 2.5 MG tablet Take 1 tablet by mouth once daily ARIPiprazole (ABILIFY) 10 MG tablet cariprazine (VRAYLAR) 1.5 mg capsules Take 1.5 mg by mouth once daily (Patient not taking: Reported on 07/09/2022) ergocalciferol, vitamin D2, 1,250 mcg (50,000 unit) capsule TAKE ONE CAPSULE BY MOUTH ONCE A WEEK (Patient not taking: Reported on 07/09/2022)  12 capsule 0 gabapentin (NEURONTIN) 300 MG capsule Take 300 mg by  mouth 3 (three) times daily (Patient not taking: Reported on 06/08/2022) gemfibroziL (LOPID) 600 mg tablet TAKE 1 TABLET BY MOUTH TWICE DAILY BEFORE MEALS (Patient not taking: Reported on 06/08/2022) 180 tablet 0 hydroCHLOROthiazide (HYDRODIURIL) 25 MG tablet Take 25 mg by mouth once daily hydrOXYzine (ATARAX) 25 MG tablet Take 25 mg by mouth once daily ketoconazole (NIZORAL) 2 % cream Apply topically 2 (two) times daily (Patient not taking: Reported on 10/03/2020) metFORMIN (GLUCOPHAGE-XR) 500 MG XR tablet Take 1 tablet (500 mg total) by mouth daily with dinner (Patient not taking: Reported on 06/08/2022) 90 tablet 3 telmisartan (MICARDIS) 40 MG tablet Take by mouth (Patient not taking: Reported on 06/08/2022) traMADoL (ULTRAM) 50 mg tablet Take 50 mg by mouth 4 (four) times daily (Patient not taking: Reported on 06/08/2022)  No current facility-administered medications for this visit.  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 Anemia Anxiety Aortic atherosclerosis (CMS-HCC) Arthritis Breast cancer (CMS-HCC) 06/2022 Cardiac murmur Chronic pain neck and back Chronic respiratory failure Complication of anesthesia COPD (chronic obstructive pulmonary disease) follows with Pulmonary [facility] Depression Emphysema of lung (CMS-HCC) GERD (gastroesophageal reflux disease) History of chemotherapy History of radiation therapy HTN (hypertension) Hyperlipidemia Hyperparathyroidism (CMS-HCC) Macular degeneration Methemoglobinemia 09/07/2016 Neuritis OSA (obstructive sleep apnea) on CPAP Oxygen dependent PONV (postoperative nausea and vomiting) Prediabetes Hb A1c 5.8% in 08/2019 Psoriasis Steatohepatitis Grade 2 (moderate) on biopsy 09/2017 T2DM (type 2 diabetes mellitus)  (CMS-HCC) Tachycardia  PAST SURGICAL HISTORY: Past Surgical History: Procedure Laterality Date TONSILLECTOMY 1959 [place] back surgery 1987 neck surgery 1999 & 2007 anterior cervical decomp/discectomy fusion 2001 cardiac catheterization 05/2012 removal of lymph node 2017 inguinal mass excision Left 02/23/2016 port a cath placement Left 03/12/2016 COLONOSCOPY 2018 port a cath removal 2018 CHOLECYSTECTOMY 09/12/2017 LIVER BIOPSY 09/12/2017 trigger finger release Right 09/2018 HYSTERECTOMY VAGINAL 12/28/2021 ANTERIOR AND POSTERIOR VAGINAL REPAIR 12/28/2021   FAMILY HISTORY: Family History Problem Relation Age of Onset Asthma Mother Myocardial Infarction (Heart attack) Mother Liver cancer Father Lung cancer Brother Stroke Maternal Aunt Breast cancer Maternal Aunt Colon cancer Neg Hx   SOCIAL HISTORY: Social History  Socioeconomic History Marital status: Widowed Tobacco Use Smoking status: Former Packs/day: 1.00 Years: 32.00 Additional pack years: 0.00 Total pack years: 32.00 Types: Cigarettes Quit date: 2008 Years since quitting: 15.9 Smokeless tobacco: Never Substance and Sexual Activity Alcohol use: Not Currently Drug use: Never  PHYSICAL EXAM: Vitals: 07/09/22 0732 BP: 127/76 Pulse: 104  Body mass index is 30.55 kg/m. Weight: 80.7 kg (178 lb)  GENERAL: Alert, active, oriented x3  HEENT: Pupils equal reactive to light. Extraocular movements are intact. Sclera clear. Palpebral conjunctiva normal red color.Pharynx clear.  NECK: Supple with no palpable mass and no adenopathy.  LUNGS: Sound clear with no rales rhonchi or wheezes.  HEART: Regular rhythm S1 and S2 without murmur.  BREAST: breasts appear normal, no suspicious masses, no skin or nipple changes or axillary nodes.  ABDOMEN: Soft and depressible, nontender with no palpable mass, no hepatomegaly.  EXTREMITIES: Well-developed well-nourished symmetrical with no  dependent edema.  NEUROLOGICAL: Awake alert oriented, facial expression symmetrical, moving all extremities.  REVIEW OF DATA: I have reviewed the following data today: No visits with results within 3 Month(s) from this visit. Latest known visit with results is: Appointment on 05/05/2020 Component Date Value Thyroid Stimulating Horm*  05/05/2020 2.271 Vitamin D, 25-Hydroxy - * 05/05/2020 40.4 Glucose 05/05/2020 87 Sodium 05/05/2020 141 Potassium 05/05/2020 4.0 Chloride 05/05/2020 103 Carbon Dioxide (CO2) 05/05/2020 28.4 Urea Nitrogen (BUN) 05/05/2020 14 Creatinine 05/05/2020 0.9 Glomerular Filtration Ra* 05/05/2020 63 Calcium 05/05/2020 10.0 AST 05/05/2020 24 ALT 05/05/2020 26 Alk Phos (alkaline Phosp* 05/05/2020 80 Albumin 05/05/2020 4.8 Bilirubin, Total 05/05/2020 0.3 Protein, Total 05/05/2020 7.0 A/G Ratio 05/05/2020 2.2 Cholesterol, Total 05/05/2020 85 (L) Triglyceride 05/05/2020 246 (H) HDL (High Density Lipopr* 05/05/2020 27.2 (L) LDL Calculated 05/05/2020 9 VLDL Cholesterol 05/05/2020 49 Cholesterol/HDL Ratio 05/05/2020 3.1 Parathyroid Hormone, Int* 05/05/2020 75 (H)   ASSESSMENT: Ms. Remington is a 66 y.o. female presenting for consultation for left breast cancer.  Patient was oriented again about the pathology results. Surgical alternatives were discussed with patient including partial vs total mastectomy. Surgical technique and post operative care was discussed with patient. Risk of surgery was discussed with patient including but not limited to: wound infection, seroma, hematoma, brachial plexopathy, mondor's disease (thrombosis of small veins of breast), chronic wound pain, breast lymphedema, altered sensation to the nipple and cosmesis among others.  At this moment patient with a 7 mm invasive mammary carcinoma ER positive, PR positive, HER2 equivocal. FISH is pending. Patient will meet with medical oncology today to decide the next step management. I discussed with  patient partial mastectomy with sentinel node biopsy. Once we have the HER2 results I will discuss with medical oncology.  HER 2 came back negative. We discuss about proceeding with partial mastectomy and sentinel lymph node biopsy.   Personal history of breast cancer [Z85.3]  PLAN: 1. Left breast partial mastectomy with sentinel lymph node biopsy 2. Admit to observation as recommended by Pulmonology.   Patient verbalized understanding, all questions were answered, and were agreeable with the plan outlined above.  Herbert Pun, MD  Electronically signed by Herbert Pun, MD Electronically signed by Windell Moment, Michaelyn Barter, MD at 07/09/2022 2:36 PM EST

## 2022-07-26 NOTE — Anesthesia Preprocedure Evaluation (Signed)
Anesthesia Evaluation  Patient identified by MRN, date of birth, ID band Patient awake    Reviewed: Allergy & Precautions, NPO status , Patient's Chart, lab work & pertinent test results  History of Anesthesia Complications (+) PONV and history of anesthetic complications  Airway Mallampati: III  TM Distance: >3 FB Neck ROM: full    Dental  (+) Chipped, Poor Dentition   Pulmonary neg pulmonary ROS, sleep apnea and Oxygen sleep apnea , COPD,  oxygen dependent, Patient did not abstain from smoking., former smoker Severe COPD, mild pulm HTN ([employer]Asys 39)   Pulmonary exam normal        Cardiovascular Exercise Tolerance: Poor hypertension, + CAD  negative cardio ROS Normal cardiovascular exam+ Valvular Problems/Murmurs      Neuro/Psych  PSYCHIATRIC DISORDERS Anxiety Depression     Neuromuscular disease negative neurological ROS  negative psych ROS   GI/Hepatic negative GI ROS, Neg liver ROS,GERD  Medicated and Controlled,,(+) Hepatitis -  Endo/Other  negative endocrine ROSdiabetes    Renal/GU      Musculoskeletal  (+) Arthritis ,    Abdominal   Peds  Hematology negative hematology ROS (+) Blood dyscrasia, anemia   Anesthesia Other Findings Past Medical History: 03/10/2016: Anal squamous cell carcinoma (HCC)     Comment:  a.) clinical stage IIIB (T2, N2, M0); Tx'd with               chemotherapy + XRT No date: Anemia No date: Anxiety No date: Aortic atherosclerosis (HCC) No date: Arthritis 07/10/2022: Breast cancer, left (HCC) No date: Complication of anesthesia     Comment:  a.) PONV No date: Constipation No date: COPD (chronic obstructive pulmonary disease) (HCC) No date: Coronary artery calcification seen on CT scan No date: Depression 03/22/4817: Diastolic dysfunction     Comment:  a.) TTE 06/08/2021: EF 55-60%; mild MR; PASP 47 mmHg;               G1DD. No date: Emphysema of lung (HCC) No date: GERD  (gastroesophageal reflux disease) 07/10/2022: Goals of care, counseling/discussion No date: H/O allergic rhinitis No date: Heart murmur No date: Hemorrhoid No date: History of fusion of cervical spine     Comment:  a.) s/p ACDF C4-C7 No date: History of kidney stones No date: Hyperlipidemia No date: Hyperparathyroidism ([place]) No date: Hypertension No date: Macular degeneration 09/07/2016: Methemoglobinemia No date: Neck pain, chronic No date: Neuritis 09/12/2016: OSA on CPAP No date: Ovarian failure No date: Oxygen dependent     Comment:  a.) 2 L/[place] (day) with increase to 4 L/[place] (night) No date: Personal history of chemotherapy No date: Personal history of radiation therapy 2017: Pneumonitis     Comment:  a.) related to MITOCYCIN administration for squamous               cell anal cancer No date: PONV (postoperative nausea and vomiting) No date: Proteinuria No date: Psoriasis 06/22/2005: Pulmonary HTN ([place])     Comment:  a.) TTE 06/22/2005: EF 61%; RVSP 31. b.) TTE 12/18/2009:              EF 57%; RVSP 19.9. c.) TTE 05/20/2016: EF >55%; PASP 43.               d.) TTE 04/07/2017: EF >55%; PASP 26. e.) TTE 03/12/2018:              EF >55%; PASP 26. f.) TTE 06/08/2021: EF 55-60%; PASP 47. No date: Steatohepatitis     Comment:  a.) grade II (moderate) on 09/2017 Bx No date: T2DM (type 2 diabetes mellitus) ([place]) No date: Tachycardia, paroxysmal (HCC) No date: Urinary incontinence  Past Surgical History: 12/28/2021: ANTERIOR AND POSTERIOR REPAIR; N/A     Comment:  Procedure: ANTERIOR (CYSTOCELE) AND POSTERIOR REPAIR               (RECTOCELE);  Surgeon: Rubie Maid, MD;  Location: [facility]              ORS;  Service: Gynecology;  Laterality: N/A; 2001: ANTERIOR CERVICAL DECOMP/DISCECTOMY FUSION; N/A     Comment:  PROCEDURE: ACDF C4-C7 1990: ANTERIOR FUSION LUMBAR SPINE     Comment:  plate in back, not metal 07/05/2022: BREAST BIOPSY; Left     Comment:  [place] Bx, Ribbon Clip, Path  Pending 07/05/2022: BREAST BIOPSY; Left     Comment:  [place] LT BREAST BX W LOC DEV 1ST LESION IMG BX [place]               GUIDE 07/05/2022 [facility] 07/22/2022: BREAST BIOPSY; Left     Comment:  [place] LT RADIO FREQUENCY TAG LOC US GUIDE 07/22/2022               [facility] 2009? : CARDIAC CATHETERIZATION     Comment:  [facility] 05/2012: CARDIAC CATHETERIZATION     Comment:  RHC: Mild hypertension 37/12 with a mean pressure of 21               mm mercury 09/12/2017: CHOLECYSTECTOMY; N/A     Comment:  Procedure: LAPAROSCOPIC CHOLECYSTECTOMY WITH               INTRAOPERATIVE CHOLANGIOGRAM;  Surgeon: Robert Bellow, MD;  Location: [facility];  Service: General;                Laterality: N/A; 2018: COLONOSCOPY 09/12/2017: LIVER BIOPSY; N/A     Comment:  Procedure: LIVER BIOPSY;  Surgeon: Robert Bellow,               MD;  Location: [facility];  Service: General;  Laterality:               N/A; 02/23/2016: MASS EXCISION; Left     Comment:  Procedure: EXCISION MASS;  Surgeon: Christene Lye, MD;  Location: [facility];  Service: General;                Laterality: Left; 2018: PORT-A-CATH REMOVAL 03/12/2016: PORTACATH PLACEMENT; Left     Comment:  Procedure: INSERTION PORT-A-CATH;  Surgeon: Christene Lye, MD;  Location: [facility];  Service: General;                Laterality: Left; 1959: TONSILLECTOMY; Bilateral 09/2018: TRIGGER FINGER RELEASE; Right     Comment:  Dr. Sabra Heck  No date: TUBAL LIGATION     Comment:  s/p 12/28/2021: VAGINAL HYSTERECTOMY; N/A     Comment:  Procedure: HYSTERECTOMY VAGINAL;  Surgeon: Rubie Maid, MD;  Location: [facility];  Service: Gynecology;                Laterality: N/A;     Reproductive/Obstetrics negative OB ROS  Anesthesia Physical Anesthesia Plan  ASA: 4  Anesthesia Plan: General ETT   Post-op Pain Management:     Induction: Intravenous  PONV Risk Score and Plan: Ondansetron, Dexamethasone, Midazolam and Treatment may vary due to age or medical condition  Airway Management Planned: Oral ETT  Additional Equipment:   Intra-op Plan:   Post-operative Plan: Extubation in OR  Informed Consent: I have reviewed the patients History and Physical, chart, labs and discussed the procedure including the risks, benefits and alternatives for the proposed anesthesia with the patient or authorized representative who has indicated his/her understanding and acceptance.     Dental Advisory Given  Plan Discussed with: Anesthesiologist, CRNA and Surgeon  Anesthesia Plan Comments: (Patient consented for risks of anesthesia including but not limited to:  - adverse reactions to medications - damage to eyes, teeth, lips or other oral mucosa - nerve damage due to positioning  - sore throat or hoarseness - Damage to heart, brain, nerves, lungs, other parts of body or loss of life  Patient voiced understanding.)       Anesthesia Quick Evaluation

## 2022-07-26 NOTE — Transfer of Care (Signed)
Immediate Anesthesia Transfer of Care Note  Patient: Judy Allen  Procedure(s) Performed: PART MASTECTOMY,RADIO FREQUENCY LOCALIZER,AXILLARY SENTINEL NODE BIOPSY (Left: Breast)  Patient Location: PACU  Anesthesia Type:General  Level of Consciousness: awake, drowsy, and patient cooperative  Airway & Oxygen Therapy: Patient Spontanous Breathing and Patient connected to face mask oxygen  Post-op Assessment: Report given to RN and Post -op Vital signs reviewed and stable  Post vital signs: Reviewed and stable  Last Vitals:  Vitals Value Taken Time  BP 102/58 07/26/22 1521  Temp    Pulse 90 07/26/22 1528  Resp 0 07/26/22 1528  SpO2 95 % 07/26/22 1528  Vitals shown include unvalidated device data.  Last Pain:  Vitals:   07/26/22 1113  TempSrc: Temporal  PainSc: 0-No pain         Complications: No notable events documented.

## 2022-07-26 NOTE — Consult Note (Signed)
PULMONOLOGY         Date: 07/26/2022,   MRN# [id number] Judy Allen 1956-03-03     Admission                  Current   Referring provider: Dr Peyton Najjar   CHIEF COMPLAINT:   Multiple pulmonary issues with planned breast surgery   HISTORY OF PRESENT ILLNESS   This is a pleasant 66 year old female with a history of chronic anemia, anxiety disorder, aortic atherosclerosis, osteoarthritis, left breast CVA status post biopsy with plan for lumpectomy, does have advanced COPD with chronic hypoxemia, chronic depression, diastolic dysfunction, centrilobular emphysema GERD, history of cervical ACDF at C4-C7, nephrolithiasis, dyslipidemia essential hypertension macular degeneration, history of OSA and pulmonary hypertension does have outpatient pulmonology with [facility].  Patient is here now for left partial mastectomy.  Concern for complex pulmonary course with pulmonary hypertension, OSA on CPAP, breast CA advanced emphysema with COPD.  PCCM consultation for medical optimization post surgery.   PAST MEDICAL HISTORY   Past Medical History:  Diagnosis Date   Anal squamous cell carcinoma ([place]) 03/10/2016   a.) clinical stage IIIB (T2, N2, M0); Tx'd with chemotherapy + XRT   Anemia    Anxiety    Aortic atherosclerosis (HCC)    Arthritis    Breast cancer, left ([place]) 61/44/3154   Complication of anesthesia    a.) PONV   Constipation    COPD (chronic obstructive pulmonary disease) (HCC)    Coronary artery calcification seen on CT scan    Depression    Diastolic dysfunction 00/86/7619   a.) TTE 06/08/2021: EF 55-60%; mild MR; PASP 47 mmHg; G1DD.   Emphysema of lung ([place])    GERD (gastroesophageal reflux disease)    Goals of care, counseling/discussion 07/10/2022   H/O allergic rhinitis    Heart murmur    Hemorrhoid    History of fusion of cervical spine    a.) s/p ACDF C4-C7   History of kidney stones    Hyperlipidemia    Hyperparathyroidism ([place])    Hypertension     Macular degeneration    Methemoglobinemia 09/07/2016   Neck pain, chronic    Neuritis    OSA on CPAP 09/12/2016   Ovarian failure    Oxygen dependent    a.) 2 L/[place] (day) with increase to 4 L/[place] (night)   Personal history of chemotherapy    Personal history of radiation therapy    Pneumonitis 2017   a.) related to [place] administration for squamous cell anal cancer   PONV (postoperative nausea and vomiting)    Proteinuria    Psoriasis    Pulmonary HTN ([place]) 06/22/2005   a.) TTE 06/22/2005: EF 61%; RVSP 31. b.) TTE 12/18/2009: EF 57%; RVSP 19.9. c.) TTE 05/20/2016: EF >55%; PASP 43. d.) TTE 04/07/2017: EF >55%; PASP 26. e.) TTE 03/12/2018: EF >55%; PASP 26. f.) TTE 06/08/2021: EF 55-60%; PASP 47.   Steatohepatitis    a.) grade II (moderate) on 09/2017 Bx   T2DM (type 2 diabetes mellitus) (HCC)    Tachycardia, paroxysmal ([place])    Urinary incontinence      SURGICAL HISTORY   Past Surgical History:  Procedure Laterality Date   ANTERIOR AND POSTERIOR REPAIR N/A 12/28/2021   Procedure: ANTERIOR (CYSTOCELE) AND POSTERIOR REPAIR (RECTOCELE);  Surgeon: Rubie Maid, MD;  Location: [facility];  Service: Gynecology;  Laterality: N/A;   ANTERIOR CERVICAL DECOMP/DISCECTOMY FUSION N/A 2001   PROCEDURE: ACDF C4-C7   ANTERIOR FUSION  LUMBAR SPINE  1990   plate in back, not metal   BREAST BIOPSY Left 07/05/2022   [place] Bx, Ribbon Clip, Path Pending   BREAST BIOPSY Left 07/05/2022   [place] LT BREAST BX W LOC DEV 1ST LESION IMG BX SPEC US GUIDE 07/05/2022 [facility]   BREAST BIOPSY Left 07/22/2022   [place] LT RADIO FREQUENCY TAG LOC US GUIDE 07/22/2022 [facility]   CARDIAC CATHETERIZATION  2009?    [facility]   CARDIAC CATHETERIZATION  05/2012   RHC: Mild hypertension 37/12 with a mean pressure of 21 mm mercury   CHOLECYSTECTOMY N/A 09/12/2017   Procedure: LAPAROSCOPIC CHOLECYSTECTOMY WITH INTRAOPERATIVE CHOLANGIOGRAM;  Surgeon: Robert Bellow, MD;  Location: [facility];  Service:  General;  Laterality: N/A;   COLONOSCOPY  2018   LIVER BIOPSY N/A 09/12/2017   Procedure: LIVER BIOPSY;  Surgeon: Robert Bellow, MD;  Location: [facility];  Service: General;  Laterality: N/A;   MASS EXCISION Left 02/23/2016   Procedure: EXCISION MASS;  Surgeon: Christene Lye, MD;  Location: [facility];  Service: General;  Laterality: Left;   PORT-A-CATH REMOVAL  2018   PORTACATH PLACEMENT Left 03/12/2016   Procedure: INSERTION PORT-A-CATH;  Surgeon: Christene Lye, MD;  Location: [facility];  Service: General;  Laterality: Left;   TONSILLECTOMY Bilateral [place] Right 09/2018   Dr. Sabra Heck    TUBAL LIGATION     s/p   VAGINAL HYSTERECTOMY N/A 12/28/2021   Procedure: HYSTERECTOMY VAGINAL;  Surgeon: Rubie Maid, MD;  Location: [facility];  Service: Gynecology;  Laterality: N/A;     FAMILY HISTORY   Family History  Problem Relation Age of Onset   Heart attack Mother    Asthma Mother    Liver cancer Father    Cancer Father    Breast cancer Maternal Aunt    Bladder Cancer Neg Hx    Kidney cancer Neg Hx    Colon cancer Neg Hx      SOCIAL HISTORY   Social History   Tobacco Use   Smoking status: Former    Packs/day: 1.00    Years: 32.00    Total pack years: 32.00    Types: Cigarettes    Start date: 08/10/1963    Quit date: 01/15/2006    Years since quitting: 16.5   Smokeless tobacco: Never  Vaping Use   Vaping Use: Never used  Substance Use Topics   Alcohol use: No   Drug use: No     MEDICATIONS    Home Medication:    Current Medication:  Current Facility-Administered Medications:    0.9 %  sodium chloride infusion, , Intravenous, Continuous, Ilene Qua, MD   aprepitant (EMEND) 40 MG capsule, , , ,    ceFAZolin (ANCEF) IVPB 2g/100 mL premix, 2 g, Intravenous, On Call to OR, Herbert Pun, MD   chlorhexidine (PERIDEX) 0.12 % solution 15 mL, 15 mL, Mouth/Throat, Once **OR** Oral care mouth rinse, 15 mL, Mouth Rinse,  Once, Ilene Qua, MD   scopolamine (TRANSDERM-SCOP) 1 MG/3DAYS, , , ,     ALLERGIES   Aczone [dapsone], Atarax [hydroxyzine], Hydrochlorothiazide, Sulfa antibiotics, and Sulfasalazine     REVIEW OF SYSTEMS    Review of Systems:  Gen:  Denies  fever, sweats, chills weigh loss  HEENT: Denies blurred vision, double vision, ear pain, eye pain, hearing loss, nose bleeds, sore throat Cardiac:  No dizziness, chest pain or heaviness, chest tightness,edema Resp:   reports dyspnea chronically  Gi:  Denies swallowing difficulty, stomach pain, nausea or vomiting, diarrhea, constipation, bowel incontinence Gu:  Denies bladder incontinence, burning urine Ext:   Denies Joint pain, stiffness or swelling Skin: Denies  skin rash, easy bruising or bleeding or hives Endoc:  Denies polyuria, polydipsia , polyphagia or weight change Psych:   Denies depression, insomnia or hallucinations   Other:  All other systems negative   VS: BP 95/65   Pulse 79   Temp (!) 96.8 F (36 C) (Temporal)   Resp 19   LMP 03/04/2002 (Approximate)   SpO2 91%      PHYSICAL EXAM    GENERAL:NAD, no fevers, chills, no weakness no fatigue HEAD: Normocephalic, atraumatic.  EYES: Pupils equal, round, reactive to light. Extraocular muscles intact. No scleral icterus.  MOUTH: Moist mucosal membrane. Dentition intact. No abscess noted.  EAR, NOSE, THROAT: Clear without exudates. No external lesions.  NECK: Supple. No thyromegaly. No nodules. No JVD.  PULMONARY: decreased breath sounds with mild rhonchi worse at bases bilaterally.  CARDIOVASCULAR: S1 and S2. Regular rate and rhythm. No murmurs, rubs, or gallops. No edema. Pedal pulses 2+ bilaterally.  GASTROINTESTINAL: Soft, nontender, nondistended. No masses. Positive bowel sounds. No hepatosplenomegaly.  MUSCULOSKELETAL: No swelling, clubbing, or edema. Range of motion full in all extremities.  NEUROLOGIC: Cranial nerves II through XII are intact. No gross focal  neurological deficits. Sensation intact. Reflexes intact.  SKIN: No ulceration, lesions, rashes, or cyanosis. Skin warm and dry. Turgor intact.  PSYCHIATRIC: Mood, affect within normal limits. The patient is awake, alert and oriented x 3. Insight, judgment intact.       IMAGING   Reviewed CT chest and abdomen performed in April 2023  ASSESSMENT/PLAN   Chronic hypoxemic respiratory failure -Due to centrilobular emphysema with COPD, complicated by pneumonitis in 2017 with further complication due to pulmonary hypertension and overlying OSA. -Moderate risk for postoperative atelectasis-recommend incentive spirometry as needed few times each day -Out of bed to chair and PT OT as able -Previous pulmonary provider with component of neuromuscular weakness documented patient did have ACDF in the past, would certainly benefit from physical therapy and outpatient   Centrilobular emphysema with COPD and chronic hypoxemia  -Generic COPD care path with as needed DuoNebs every 6 hours  -No need for systemic steroids at this time  -Continue outpatient Anoro and albuterol as needed -Continue supplemental O2 at work liters per minute, has used for liters nocturnally in the past   OSA on CPAP -Will order CPAP to be used nocturnally here with RT setting modifications as needed   Mild to moderate pulmonary hypertension -Via transthoracic echo , confirm adequate fluid balance, oxygenation.  Will defer to outpatient pulmonology no need to start pulmonary vasodilators while inpatient     Thank you for allowing me to participate in the care of this patient.   Patient/Family are satisfied with care plan and all questions have been answered.    Provider disclosure: Patient with at least one acute or chronic illness or injury that poses a threat to life or bodily function and is being managed actively during this encounter.  All of the below services have been performed independently by signing  provider:  review of prior documentation from internal and or external health records.  Review of previous and current lab results.  Interview and comprehensive assessment during patient visit today. Review of current and previous chest radiographs/CT scans. Discussion of management and test interpretation with health care team and patient/family.   This document was prepared  using Systems analyst and may include unintentional dictation errors.     Ottie Glazier, M.D.  Division of Pulmonary & Critical Care Medicine

## 2022-07-26 NOTE — Progress Notes (Signed)
Patient placed on CPAP for HS with 4L O2 bled in, tolerating well.

## 2022-07-26 NOTE — Anesthesia Procedure Notes (Signed)
Procedure Name: Intubation Date/Time: 07/26/2022 1:25 PM  Performed by: Kelton Pillar, CRNAPre-anesthesia Checklist: Patient identified, Emergency Drugs available, Suction available and Patient being monitored Patient Re-evaluated:Patient Re-evaluated prior to induction Oxygen Delivery Method: Circle system utilized Preoxygenation: Pre-oxygenation with 100% oxygen Induction Type: IV induction Ventilation: Mask ventilation without difficulty Laryngoscope Size: McGraph and 3 Grade View: Grade I Tube type: Oral Tube size: 6.5 mm Number of attempts: 1 Airway Equipment and Method: Stylet and Oral airway Placement Confirmation: ETT inserted through vocal cords under direct vision, positive ETCO2, breath sounds checked- equal and bilateral and CO2 detector Secured at: 21 cm Tube secured with: Tape Dental Injury: Teeth and Oropharynx as per pre-operative assessment

## 2022-07-26 NOTE — Op Note (Signed)
Preoperative diagnosis: Left breast carcinoma.  Postoperative diagnosis: Same.   Procedure: Left radiofrequency tag-localized partial mastectomy.                      Left Axillary Sentinel Lymph node biopsy  Anesthesia: GETA  Surgeon: Dr. Windell Moment  Wound Classification: Clean  Indications: Patient is a 66 y.o. female with a nonpalpable left breast mass noted on mammography with core biopsy demonstrating invasive mammary carcinoma requires radiofrequency tag-localized partial mastectomy for treatment with sentinel lymph node biopsy.   Findings: 1. Specimen mammography shows marker and tag on specimen 2. Pathology call refers gross examination of margins was negative 3. No other palpable mass or lymph node identified.   Description of procedure: Preoperative radiofrequency tag localization was performed by radiology. In the nuclear medicine suite, the subareolar region was injected with Tc-99 sulfur colloid. Localization studies were reviewed. The patient was taken to the operating room and placed supine on the operating table, and after general anesthesia the left chest and axilla were prepped and draped in the usual sterile fashion. A time-out was completed verifying correct patient, procedure, site, positioning, and implant(s) and/or special equipment prior to beginning this procedure.  By comparing the localization studies and interrogation with Localizer device, the probable trajectory and location of the mass was visualized. A circumareolar skin incision was planned in such a way as to minimize the amount of dissection to reach the mass.  The skin incision was made. Flaps were raised and the location of the tag was confirmed with Localizer device confirmed. A 2-0 silk figure-of-eight stay suture was placed and used for retraction. Dissection was then taken down circumferentially, taking care to include the entire localizing tag and a wide margin of grossly normal tissue. The specimen  and entire localizing tag were removed. The specimen was oriented and sent to radiology with the localization studies. Confirmation was received that the entire target lesion had been resected. The wound was irrigated. Hemostasis was checked. The wound was closed with interrupted sutures of 3-0 Vicryl and a subcuticular suture of Monocryl 3-0. No attempt was made to close the dead space.   A hand-held gamma probe was used to identify the location of the hottest spot in the axilla. An incision was made around the caudal axillary hairline. Dissection was carried down until subdermal facias was advanced. The probe was placed and again, the point of maximal count was found. Dissection continue until nodule was identified. The probe was placed in contact with the node. The node was excised in its entirety.  Two additional hot spot was detected and the node was excised in similar fashion. No additional hot spots were identified. No clinically abnormal nodes were palpated. The procedure was terminated. Hemostasis was achieved and the wound closed in layers with deep interrupted 3-0 Vicryl and skin was closed with subcuticular suture of Monocryl 3-0.  The patient tolerated the procedure well and was taken to the postanesthesia care unit in stable condition.   Sentinel Node Biopsy Synoptic Operative Report  Operation performed with curative intent:Yes  Tracer(s) used to identify sentinel nodes in the upfront surgery (non-neoadjuvant) setting (select all that apply):Radioactive Tracer  Tracer(s) used to identify sentinel nodes in the neoadjuvant setting (select all that apply):N/A  All nodes (colored or non-colored) present at the end of a dye-filled lymphatic channel were removed:N/A  All significantly radioactive nodes were removed:Yes  All palpable suspicious nodes were removed:N/A  Biopsy-proven positive nodes marked with clips prior  to chemotherapy were identified and removed:N/A  Specimen: Left  Breast mass                     Sentinel Lymph nodes #1, #2, #3  Complications: None  Estimated Blood Loss: 25 mL

## 2022-07-27 ENCOUNTER — Encounter: Payer: Self-pay | Admitting: General Surgery

## 2022-07-27 DIAGNOSIS — I10 Essential (primary) hypertension: Secondary | ICD-10-CM | POA: Diagnosis not present

## 2022-07-27 DIAGNOSIS — E119 Type 2 diabetes mellitus without complications: Secondary | ICD-10-CM | POA: Diagnosis not present

## 2022-07-27 DIAGNOSIS — J449 Chronic obstructive pulmonary disease, unspecified: Secondary | ICD-10-CM | POA: Diagnosis not present

## 2022-07-27 DIAGNOSIS — Z79899 Other long term (current) drug therapy: Secondary | ICD-10-CM | POA: Diagnosis not present

## 2022-07-27 DIAGNOSIS — Z85048 Personal history of other malignant neoplasm of rectum, rectosigmoid junction, and anus: Secondary | ICD-10-CM | POA: Diagnosis not present

## 2022-07-27 DIAGNOSIS — Z170 Estrogen receptor positive status [ER+]: Secondary | ICD-10-CM | POA: Diagnosis not present

## 2022-07-27 DIAGNOSIS — Z7984 Long term (current) use of oral hypoglycemic drugs: Secondary | ICD-10-CM | POA: Diagnosis not present

## 2022-07-27 DIAGNOSIS — C50312 Malignant neoplasm of lower-inner quadrant of left female breast: Secondary | ICD-10-CM | POA: Diagnosis not present

## 2022-07-27 DIAGNOSIS — Z87891 Personal history of nicotine dependence: Secondary | ICD-10-CM | POA: Diagnosis not present

## 2022-07-27 LAB — CBC
HCT: 31.1 % — ABNORMAL LOW (ref 36.0–46.0)
Hemoglobin: 10.1 g/dL — ABNORMAL LOW (ref 12.0–15.0)
MCH: 29.4 pg (ref 26.0–34.0)
MCHC: 32.5 g/dL (ref 30.0–36.0)
MCV: 90.7 fL (ref 80.0–100.0)
Platelets: 165 10*3/uL (ref 150–400)
RBC: 3.43 MIL/uL — ABNORMAL LOW (ref 3.87–5.11)
RDW: 14.7 % (ref 11.5–15.5)
WBC: 9 10*3/uL (ref 4.0–10.5)
nRBC: 0 % (ref 0.0–0.2)

## 2022-07-27 LAB — BASIC METABOLIC PANEL
Anion gap: 5 (ref 5–15)
BUN: 16 mg/dL (ref 8–23)
CO2: 28 mmol/L (ref 22–32)
Calcium: 8.7 mg/dL — ABNORMAL LOW (ref 8.9–10.3)
Chloride: 108 mmol/L (ref 98–111)
Creatinine, Ser: 0.87 mg/dL (ref 0.44–1.00)
GFR, Estimated: >60 mL/min (ref >60)
Glucose, Bld: 149 mg/dL — ABNORMAL HIGH (ref 70–99)
Potassium: 4 mmol/L (ref 3.5–5.1)
Sodium: 141 mmol/L (ref 135–145)

## 2022-07-27 MED ORDER — HYDROCODONE-ACETAMINOPHEN 5-325 MG PO TABS: 1.0000 | ORAL_TABLET | ORAL | 0 refills | Status: AC | PRN

## 2022-07-27 NOTE — Progress Notes (Signed)
Pt discharged per MD order. Discharge instructions reviewed with pt. Pt verbalized understanding. All questions answered to pt satisfaction. Pt taken out in wheelchair by staff.

## 2022-07-27 NOTE — Discharge Summary (Signed)
Patient ID: Judy Allen MRN: [id number] DOB/AGE: 10/31/1955 66 y.o.  Admit date: 07/26/2022 Discharge date: 07/27/2022   Discharge Diagnoses:  Principal Problem:   Breast cancer [facility])   Procedures: Left partial mastectomy with axillary sentinel node biopsy  Hospital Course: Patient admitted for treatment of left breast cancer.  She underwent left partial mastectomy with left axillary sentinel node biopsy.  She was admitted for observation due to recommendation from her pulmonologist due to her severe COPD and pulmonary fibrosis.  She tolerated the procedure well.  She has been doing well.  This morning patient is very comfortable without any shortness of breath.  She is nasal cannula 4 L with 96% of saturation.  The wound is dry and clean.  No fluid collection, no erythema, no drainage.  No hematoma.  Physical Exam Vitals reviewed.  Constitutional:      Appearance: Normal appearance.  HENT:     Head: Normocephalic.     Nose: Nose normal.  Cardiovascular:     Rate and Rhythm: Normal rate and regular rhythm.  Pulmonary:     Effort: Pulmonary effort is normal.     Breath sounds: Normal breath sounds.  Abdominal:     General: Abdomen is flat.  Musculoskeletal:     Cervical back: Normal range of motion.  Skin:    General: Skin is warm.     Capillary Refill: Capillary refill takes less than 2 seconds.  Neurological:     Mental Status: She is alert and oriented to person, place, and time.   Left breast wound without edema, no fluid collection, no palpable masses, hematoma or bruise.   Consults: Pulmonology  Disposition: Discharge disposition: 01-Home or Self Care       Discharge Instructions     Diet - low sodium heart healthy   Complete by: As directed    Increase activity slowly   Complete by: As directed       Allergies as of 07/27/2022       Reactions   Aczone [dapsone] Other (See Comments)   Hypoxemia   Atarax [hydroxyzine]    Hypoxemia     Hydrochlorothiazide    History of hyperparathyroidism    Sulfa Antibiotics Rash   Sulfasalazine Rash        Medication List     TAKE these medications    albuterol 108 (90 Base) MCG/ACT inhaler Commonly known as: VENTOLIN HFA Inhale 2 puffs into the lungs every 6 (six) hours as needed for wheezing or shortness of breath.   Anoro Ellipta 62.5-25 MCG/ACT Aepb Generic drug: umeclidinium-vilanterol Inhale 1 puff into the lungs in the morning.   Belbuca 300 MCG Film Generic drug: Buprenorphine HCl Place 1 Film inside cheek every 12 (twelve) hours.   Belbuca 450 MCG Film Generic drug: Buprenorphine HCl Place 1 Film (450 mcg total) inside cheek every 12 (twelve) hours.   busPIRone 7.5 MG tablet Commonly known as: BUSPAR TAKE ONE TABLET BY MOUTH TWICE DAILY.   Effexor XR 75 MG 24 hr capsule Generic drug: venlafaxine XR TAKE 3 CAPSULES DAILY WITH BREAKFAST   EYE VITAMINS & MINERALS PO Take 1 capsule by mouth in the morning. [employer] Eye Vitamins   gabapentin 400 MG capsule Commonly known as: Neurontin Take 1 capsule (400 mg total) by mouth 3 (three) times daily.   HYDROcodone-acetaminophen 7.5-325 MG tablet Commonly known as: NORCO Take 1 tablet by mouth every 8 (eight) hours as needed for moderate pain. What changed: Another medication with the same name was  added. Make sure you understand how and when to take each.   HYDROcodone-acetaminophen 5-325 MG tablet Commonly known as: Norco Take 1 tablet by mouth every 4 (four) hours as needed for up to 3 days for moderate pain. What changed: You were already taking a medication with the same name, and this prescription was added. Make sure you understand how and when to take each.   metoprolol succinate 50 MG 24 hr tablet Commonly known as: TOPROL-XL Take 50 mg by mouth in the morning.   NIFEdipine 30 MG 24 hr tablet Commonly known as: PROCARDIA-XL/NIFEDICAL-XL Take 30 mg by mouth at bedtime.   omega-3 acid ethyl  esters 1 g capsule Commonly known as: LOVAZA TAKE 2 CAPSULES BY MOUTH TWO TIMES A DAY   omeprazole 40 MG capsule Commonly known as: PRILOSEC Take 40 mg by mouth daily.   OXYGEN Inhale 2-4 L into the lungs continuous. 2 L qd and 4 L qhs   Potassium Gluconate 80 MG Tabs Take 1 tablet by mouth daily.   telmisartan 80 MG tablet Commonly known as: MICARDIS Take 80 mg by mouth at bedtime.   tiZANidine 4 MG tablet Commonly known as: ZANAFLEX Take 1 tablet (4 mg total) by mouth every 8 (eight) hours as needed.   torsemide 5 MG tablet Commonly known as: DEMADEX Take 5 mg by mouth every morning.   Vitamin D3 1000 units Caps Take 1 capsule by mouth daily.        Follow-up Information     Herbert Pun, MD Follow up in 2 week(s).   Specialty: General Surgery Contact information: [address] [place] [address] [phone]

## 2022-07-27 NOTE — Discharge Instructions (Signed)
  Diet: Resume home heart healthy regular diet.   Activity: Increase activity as tolerated. Light activity and walking are encouraged. Do not drive or drink alcohol if taking narcotic pain medications.  Wound care: May shower with soapy water and pat dry (do not rub incisions), but no baths or submerging incision underwater until follow-up. (no swimming)   Medications: Resume all home medications. For mild to moderate pain: acetaminophen (Tylenol) or ibuprofen (if no kidney disease). Combining Tylenol with alcohol can substantially increase your risk of causing liver disease. Narcotic pain medications, if prescribed, can be used for severe pain, though may cause nausea, constipation, and drowsiness. Do not combine Tylenol and Norco within a 6 hour period as Norco contains Tylenol. If you do not need the narcotic pain medication, you do not need to fill the prescription.  Call office [phone] at any time if any questions, worsening pain, fevers/chills, bleeding, drainage from incision site, or other concerns.

## 2022-07-28 ENCOUNTER — Other Ambulatory Visit: Payer: Self-pay | Admitting: Anatomic Pathology & Clinical Pathology

## 2022-07-28 LAB — SURGICAL PATHOLOGY

## 2022-07-29 ENCOUNTER — Encounter: Payer: Self-pay | Admitting: *Deleted

## 2022-07-29 NOTE — Progress Notes (Signed)
Oncotype Dx order [id number] submitted online on path [id number]. Follow up appt made with Dr. Tasia Catchings in 2 weeks on 08/13/21.   Appt. Details given to patient.  Judy Allen is doing well post surgery with only some mild soreness.

## 2022-07-30 NOTE — Progress Notes (Unsigned)
Name: Judy Allen   MRN: [id number]    DOB: [birth year]   Date:08/03/2022       Progress Note  Subjective  Chief Complaint  Follow Up  HPI  History of anal cancer: patient was diagnosed with anal cancer in 2017, biopsy done by Dr. Jamal Collin and treated with radiation at [facility].Doing well now  Breast cancer - left side: found by abnormal mammogram and biopsy positive 07/05/2022 , invasive mammary carcinoma. She will go to [facility] for radiation therapy   MDD: long history of depression, currently on Effexor 225 mg daily , she used to take abilify but it caused tremors.after that we tried Vraylar but also caused tremors once she went up on the dose. She is currently on ly Effexor and buspar, feeling more anxious lately ( recent diagnosis of breast cancer) explained we can try increasing frequency of buspar to 3 times daily and if needed go up on the dose to 10 mg TID.  She does not want to see a psychiatrist . She went to [place] in the past but does not want to go back at this time.   Hypertriglyceridemiareviewed last labs done in our office was 07/2021 and LDL was 70 . Currently taking Crestor , Lovaza and Gemfibrozil , denies side effects.We will recheck labs    HTN/Paroxysmal tachycardia: . She is currently taking Norvasc 2.5 , Telmisartan 40 Metoprolol 60m , she is under the care of Dr. VSmith Mince- nephrologist, last visit Oct 2023. She has good urine output, denies any pruritus    Hyperparathyroidism: diagnosed years ago and used to see Dr. SGabriel Carina She has been off HCTZ and last parathyroid test and vitamin D back to normal and we will not recheck it today    Emphysema and hypoxemia: on oxygen 2  liters during the day and 4 liters when sleeping . She continues to have sob is stable and only with activity, denies current cough but has noticed mild wheezing since yesterday  . [facility]    DMII: diagnosed 02/2018 by Dr. SGabriel Carina hgbA1C was at 6.6%, A1C  but has been off Metformin  and A1C has been within normal limits, down to 6.2 %  % today.  She has diabetes neuropathy - pain on both feet . She also has dyslipidemia.   Continue Gabapentin for foot pain .    Chronic pain: under the care of Dr. LHolley Raring she states she is doing well on Gabapentin , recently starting on new pain management - of Buprenorphine film , also taking muscle relaxer prn .She has a history of laminectomy. She has neck and back pain, pain level right now is 2/10   Senile purpura: she states not as bad now, usually on arms Unchanged and reassurance given   Psoriasis: she is doing well, no current rashes .She lost to follow up and is out of creams and injections . She asked for topical medication, advised to contact hime.   Patient Active Problem List   Diagnosis Date Noted   [place]ast [place] ([place] 07/26/2022   [place] cancer, [place] ([place] 07/10/2022   Invasive carcinoma of breast ([place] 07/10/2022   Goals of care, counseling/discussion 07/10/2022   Cystocele with rectocele 12/28/2021   S/P vaginal hysterectomy 12/28/2021   Bilateral hip pain 11/03/2021   Hypotension due to drugs 05/03/2021   Disorder of SI (sacroiliac) joint 11/11/2020   Arthritis of sacroiliac joint of both sides 11/11/2020   Chronic radicular lumbar pain 11/11/2020  Hyperparathyroidism ([place]) 08/25/2019   Diabetes mellitus type 2 in obese ([place]) 11/06/2018   Cervical radiculopathy (left) 08/31/2018   Osteoarthritis of spine with radiculopathy, cervical region 08/31/2018   History of anal cancer 05/30/2018   Myofascial pain syndrome, cervical 05/16/2018   Chronic pain syndrome 05/16/2018   Cervical facet joint syndrome 05/16/2018   Perihepatitis ([place]) 09/27/2017   Fatty liver 12/22/2016   Chronic respiratory failure with hypoxia, on home oxygen therapy ([place]) 07/28/2016   Centrilobular emphysema ([place]) 07/14/2016   Abnormal Pap smear of cervix 04/14/2016   Anal squamous cell carcinoma ([place]) 03/09/2016   Non-thrombocytopenic  purpura ([place]) 01/12/2016   Chronic constipation 10/16/2015   History of fusion of cervical spine 04/02/2015   Benign hypertension 01/16/2015   Chronic cervical pain 01/16/2015   CN (constipation) 01/16/2015   Gastric reflux 01/16/2015   Hypertriglyceridemia 01/16/2015   Edema leg 01/16/2015   Chronic recurrent major depressive disorder ([place]) 09/38/1829   Dysmetabolic syndrome 93/71/6967   Obstructive apnea 01/16/2015   Psoriasis 01/16/2015   Allergic rhinitis 01/16/2015   Bursitis, trochanteric 01/16/2015   GAD (generalized anxiety disorder) 01/16/2015   Tachycardia, paroxysmal ([place])     Past Surgical History:  Procedure Laterality Date   ANTERIOR AND POSTERIOR REPAIR N/A 12/28/2021   Procedure: ANTERIOR (CYSTOCELE) AND POSTERIOR REPAIR (RECTOCELE);  Surgeon: Rubie Maid, MD;  Location: [facility];  Service: Gynecology;  Laterality: N/A;   ANTERIOR CERVICAL DECOMP/DISCECTOMY FUSION N/A 2001   PROCEDURE: ACDF C4-C7   ANTERIOR FUSION LUMBAR SPINE  1990   plate in back, not metal   BREAST BIOPSY Left 07/05/2022   [place] Bx, Ribbon Clip, Path Pending   BREAST BIOPSY Left 07/05/2022   [place] LT BREAST BX W LOC DEV 1ST LESION IMG BX SPEC US GUIDE 07/05/2022 [facility]   BREAST BIOPSY Left 07/22/2022   [place] LT RADIO FREQUENCY TAG LOC US GUIDE 07/22/2022 [facility]   CARDIAC CATHETERIZATION  2009?    [facility]   CARDIAC CATHETERIZATION  05/2012   RHC: Mild hypertension 37/12 with a mean pressure of 21 mm mercury   CHOLECYSTECTOMY N/A 09/12/2017   Procedure: LAPAROSCOPIC CHOLECYSTECTOMY WITH INTRAOPERATIVE CHOLANGIOGRAM;  Surgeon: Robert Bellow, MD;  Location: [facility];  Service: General;  Laterality: N/A;   COLONOSCOPY  2018   LIVER BIOPSY N/A 09/12/2017   Procedure: LIVER BIOPSY;  Surgeon: Robert Bellow, MD;  Location: [facility];  Service: General;  Laterality: N/A;   MASS EXCISION Left 02/23/2016   Procedure: EXCISION MASS;  Surgeon: Christene Lye, MD;   Location: [facility];  Service: General;  Laterality: Left;   PART [place] BIOPSY Left 07/26/2022   Procedure: PART [place] NODE BIOPSY;  Surgeon: Herbert Pun, MD;  Location: [facility];  Service: General;  Laterality: Left;   PORT-A-CATH REMOVAL  2018   PORTACATH PLACEMENT Left 03/12/2016   Procedure: INSERTION PORT-A-CATH;  Surgeon: Christene Lye, MD;  Location: [facility];  Service: General;  Laterality: Left;   TONSILLECTOMY Bilateral [place] Right 09/2018   Dr. Sabra Heck    TUBAL LIGATION     s/p   VAGINAL HYSTERECTOMY N/A 12/28/2021   Procedure: HYSTERECTOMY VAGINAL;  Surgeon: Rubie Maid, MD;  Location: [facility];  Service: Gynecology;  Laterality: N/A;    Family History  Problem Relation Age of Onset   Heart attack Mother    Asthma Mother    Liver cancer Father    Cancer Father    Breast  cancer Maternal Aunt    Bladder Cancer Neg Hx    Kidney cancer Neg Hx    Colon cancer Neg Hx     Social History   Tobacco Use   Smoking status: Former    Packs/day: 1.00    Years: 32.00    Total pack years: 32.00    Types: Cigarettes    Start date: 08/10/1963    Quit date: 01/15/2006    Years since quitting: 16.5   Smokeless tobacco: Never  Substance Use Topics   Alcohol use: No     Current Outpatient Medications:    albuterol (PROVENTIL HFA;VENTOLIN HFA) 108 (90 Base) MCG/ACT inhaler, Inhale 2 puffs into the lungs every 6 (six) hours as needed for wheezing or shortness of breath., Disp: , Rfl:    Buprenorphine HCl (BELBUCA) 300 MCG FILM, Place 1 Film inside cheek every 12 (twelve) hours., Disp: 60 each, Rfl: 0   Buprenorphine HCl (BELBUCA) 450 MCG FILM, Place 1 Film (450 mcg total) inside cheek every 12 (twelve) hours., Disp: 60 each, Rfl: 1   Cholecalciferol (VITAMIN D3) 1000 units CAPS, Take 1 capsule by mouth daily., Disp: , Rfl:    EFFEXOR XR 75  MG 24 hr capsule, TAKE 3 CAPSULES DAILY WITH BREAKFAST, Disp: 270 capsule, Rfl: 1   gabapentin (NEURONTIN) 400 MG capsule, Take 1 capsule (400 mg total) by mouth 3 (three) times daily., Disp: 90 capsule, Rfl: 2   metoprolol succinate (TOPROL-XL) 50 MG 24 hr tablet, Take 50 mg by mouth in the morning., Disp: , Rfl:    Multiple Vitamins-Minerals (EYE VITAMINS & MINERALS PO), Take 1 capsule by mouth in the morning. [employer] Eye Vitamins, Disp: , Rfl:    NIFEdipine (PROCARDIA-XL/NIFEDICAL-XL) 30 MG 24 hr tablet, Take 30 mg by mouth at bedtime., Disp: , Rfl:    omega-3 acid ethyl esters (LOVAZA) 1 g capsule, TAKE 2 CAPSULES BY MOUTH TWO TIMES A DAY, Disp: 360 capsule, Rfl: 1   omeprazole (PRILOSEC) 40 MG capsule, Take 40 mg by mouth daily., Disp: , Rfl:    OXYGEN, Inhale 2-4 L into the lungs continuous. 2 L qd and 4 L qhs, Disp: , Rfl:    Potassium Gluconate 80 MG TABS, Take 1 tablet by mouth daily., Disp: , Rfl:    telmisartan (MICARDIS) 80 MG tablet, Take 80 mg by mouth at bedtime., Disp: , Rfl:    tiZANidine (ZANAFLEX) 4 MG tablet, Take 1 tablet (4 mg total) by mouth every 8 (eight) hours as needed., Disp: 270 tablet, Rfl: 2   torsemide (DEMADEX) 5 MG tablet, Take 5 mg by mouth every morning., Disp: , Rfl:    umeclidinium-vilanterol (ANORO ELLIPTA) 62.5-25 MCG/ACT AEPB, Inhale 1 puff into the lungs in the morning., Disp: , Rfl:    busPIRone (BUSPAR) 7.5 MG tablet, Take 1 tablet (7.5 mg total) by mouth 3 (three) times daily., Disp: 90 tablet, Rfl: 0  Allergies  Allergen Reactions   Aczone [Dapsone] Other (See Comments)    Hypoxemia   Atarax [Hydroxyzine]     Hypoxemia    Hydrochlorothiazide     History of hyperparathyroidism    Sulfa Antibiotics Rash   Sulfasalazine Rash    I personally reviewed active problem list, medication list, allergies, family history, social history, health maintenance with the patient/caregiver today.   ROS  Ten systems reviewed and is negative except as  mentioned in HPI   Objective  Vitals:   08/03/22 1434  BP: 122/72  Pulse: 86  Resp: 16  Temp: 98.2 F (36.8 C)  TempSrc: Oral  SpO2: 93%  Weight: 184 lb 11.2 oz (83.8 kg)  Height: 5' 4"  (1.626 m)    Body mass index is 31.7 kg/m.  Physical Exam  Constitutional: Patient appears well-developed and well-nourished.  No distress.  HEENT: head atraumatic, normocephalic, pupils equal and reactive to light, neck supple Cardiovascular: Normal rate, regular rhythm and normal heart sounds.  No murmur heard. No BLE edema. Pulmonary/Chest: Effort normal and breath sounds normal. No respiratory distress. Abdominal: Soft.  There is no tenderness. Psychiatric: Patient has a normal mood and affect. behavior is normal. Judgment and thought content normal.    PHQ2/9:    08/03/2022    2:41 PM 06/10/2022   10:12 AM 05/19/2022   10:09 AM 01/20/2022   10:04 AM 11/06/2021   10:14 AM  Depression screen PHQ 2/9  Decreased Interest 0 2 2 0 0  Down, Depressed, Hopeless 0 3 3 2 1   PHQ - 2 Score 0 5 5 2 1   Altered sleeping 0 0 1 0 0  Tired, decreased energy 1 2 2  0 1  Change in appetite 1 2 2  0 0  Feeling bad or failure about yourself  0 1 2 0 0  Trouble concentrating 0 1 0 0 0  Moving slowly or fidgety/restless 2 1 1  0 3  Suicidal thoughts 0 0 0 0 0  PHQ-9 Score 4 12 13 2 5   Difficult doing work/chores Not difficult at all Somewhat difficult Somewhat difficult  Not difficult at all    phq 9 is negative     08/03/2022    2:42 PM 06/10/2022   10:14 AM 05/19/2022   10:11 AM 11/06/2021   10:15 AM  GAD 7 : Generalized Anxiety Score  Nervous, Anxious, on Edge 3 2 3 3   Control/stop worrying 1 2 2  0  Worry too much - different things 1 3 2  0  Trouble relaxing 2 2 3 1   Restless 1 2 1 1   Easily annoyed or irritable 1 2 2 1   Afraid - awful might happen 0 0 0 0  Total GAD 7 Score 9 13 13 6   Anxiety Difficulty Not difficult at all  Somewhat difficult Not difficult at all      Fall Risk:     08/03/2022    2:41 PM 06/15/2022    1:11 PM 06/10/2022   10:11 AM 04/27/2022    1:11 PM 02/04/2022   11:38 AM  Fall Risk   Falls in the past year? 0 0 0 0 0  Number falls in past yr:  0 0  0  Injury with Fall?   0  0  Risk for fall due to : No Fall Risks No Fall Risks   No Fall Risks  Follow up Falls prevention discussed;Education provided;Falls evaluation completed Falls evaluation completed   Falls evaluation completed     Functional Status Survey: Is the patient deaf or have difficulty hearing?: No Does the patient have difficulty seeing, even when wearing glasses/contacts?: No Does the patient have difficulty concentrating, remembering, or making decisions?: No Does the patient have difficulty walking or climbing stairs?: No Does the patient have difficulty dressing or bathing?: No Does the patient have difficulty doing errands alone such as visiting a doctor's office or shopping?: No  Diabetic Foot Exam - Simple   Simple Foot Form Visual Inspection See comments: Yes Sensation Testing Intact to touch and monofilament testing bilaterally: Yes Pulse Check Posterior Tibialis and Dorsalis  pulse intact bilaterally: Yes Comments Nodule on mid left foot , some corn formation       Assessment & Plan  1. Dyslipidemia associated with type 2 diabetes mellitus ([place])  - POCT HgB A1C - HM Diabetes Foot Exam - Lipid panel  2. Chronic recurrent major depressive disorder (HCC)  - busPIRone (BUSPAR) 7.5 MG tablet; Take 1 tablet (7.5 mg total) by mouth 3 (three) times daily.  Dispense: 90 tablet; Refill: 0  3. Senile purpura ([place])  Reassurance given  4. Hypertriglyceridemia   5. Centrilobular emphysema ([place])  Under the care of pulmonologist  6. Hyperparathyroidism ([place])  Secondary to renal disease  7. Anemia, unspecified type  - CBC with Differential/Platelet - Iron, TIBC and Ferritin Panel  8. Long-term use of high-risk medication  - COMPLETE METABOLIC PANEL WITH  GFR

## 2022-08-03 ENCOUNTER — Ambulatory Visit (INDEPENDENT_AMBULATORY_CARE_PROVIDER_SITE_OTHER): Payer: Medicare HMO | Admitting: Family Medicine

## 2022-08-03 ENCOUNTER — Encounter: Payer: Self-pay | Admitting: Family Medicine

## 2022-08-03 VITALS — BP 122/72 | HR 86 | Temp 98.2°F | Resp 16 | Ht 64.0 in | Wt 184.7 lb

## 2022-08-03 DIAGNOSIS — D649 Anemia, unspecified: Secondary | ICD-10-CM

## 2022-08-03 DIAGNOSIS — Z79899 Other long term (current) drug therapy: Secondary | ICD-10-CM | POA: Diagnosis not present

## 2022-08-03 DIAGNOSIS — D692 Other nonthrombocytopenic purpura: Secondary | ICD-10-CM | POA: Diagnosis not present

## 2022-08-03 DIAGNOSIS — E1169 Type 2 diabetes mellitus with other specified complication: Secondary | ICD-10-CM

## 2022-08-03 DIAGNOSIS — E781 Pure hyperglyceridemia: Secondary | ICD-10-CM | POA: Diagnosis not present

## 2022-08-03 DIAGNOSIS — F339 Major depressive disorder, recurrent, unspecified: Secondary | ICD-10-CM

## 2022-08-03 DIAGNOSIS — E213 Hyperparathyroidism, unspecified: Secondary | ICD-10-CM

## 2022-08-03 DIAGNOSIS — R69 Illness, unspecified: Secondary | ICD-10-CM | POA: Diagnosis not present

## 2022-08-03 DIAGNOSIS — J432 Centrilobular emphysema: Secondary | ICD-10-CM | POA: Diagnosis not present

## 2022-08-03 DIAGNOSIS — E785 Hyperlipidemia, unspecified: Secondary | ICD-10-CM

## 2022-08-03 LAB — POCT GLYCOSYLATED HEMOGLOBIN (HGB A1C): Hemoglobin A1C: 6.3 % — AB (ref 4.0–5.6)

## 2022-08-03 MED ORDER — BUSPIRONE HCL 7.5 MG PO TABS: 7.5000 mg | ORAL_TABLET | Freq: Three times a day (TID) | ORAL | 0 refills | Status: DC

## 2022-08-04 LAB — CBC WITH DIFFERENTIAL/PLATELET
Absolute Monocytes: 581 cells/uL (ref 200–950)
Basophils Absolute: 70 cells/uL (ref 0–200)
Basophils Relative: 0.8 %
Eosinophils Absolute: 827 cells/uL — ABNORMAL HIGH (ref 15–500)
Eosinophils Relative: 9.4 %
HCT: 34.1 % — ABNORMAL LOW (ref 35.0–45.0)
Hemoglobin: 11.6 g/dL — ABNORMAL LOW (ref 11.7–15.5)
Lymphs Abs: 1690 cells/uL (ref 850–3900)
MCH: 30.3 pg (ref 27.0–33.0)
MCHC: 34 g/dL (ref 32.0–36.0)
MCV: 89 fL (ref 80.0–100.0)
MPV: 9.8 fL (ref 7.5–12.5)
Monocytes Relative: 6.6 %
Neutro Abs: 5632 cells/uL (ref 1500–7800)
Neutrophils Relative %: 64 %
Platelets: 281 10*3/uL (ref 140–400)
RBC: 3.83 10*6/uL (ref 3.80–5.10)
RDW: 13.7 % (ref 11.0–15.0)
Total Lymphocyte: 19.2 %
WBC: 8.8 10*3/uL (ref 3.8–10.8)

## 2022-08-04 LAB — COMPLETE METABOLIC PANEL WITH GFR
AG Ratio: 1.6 (calc) (ref 1.0–2.5)
ALT: 47 U/L — ABNORMAL HIGH (ref 6–29)
AST: 46 U/L — ABNORMAL HIGH (ref 10–35)
Albumin: 4.4 g/dL (ref 3.6–5.1)
Alkaline phosphatase (APISO): 95 U/L (ref 37–153)
BUN: 11 mg/dL (ref 7–25)
CO2: 31 mmol/L (ref 20–32)
Calcium: 9.4 mg/dL (ref 8.6–10.4)
Chloride: 103 mmol/L (ref 98–110)
Creat: 0.79 mg/dL (ref 0.50–1.05)
Globulin: 2.7 g/dL (calc) (ref 1.9–3.7)
Glucose, Bld: 97 mg/dL (ref 65–99)
Potassium: 4.4 mmol/L (ref 3.5–5.3)
Sodium: 143 mmol/L (ref 135–146)
Total Bilirubin: 0.4 mg/dL (ref 0.2–1.2)
Total Protein: 7.1 g/dL (ref 6.1–8.1)
eGFR: 82 mL/min/{1.73_m2} (ref >=60)

## 2022-08-04 LAB — IRON,TIBC AND FERRITIN PANEL
%SAT: 22 % (calc) (ref 16–45)
Ferritin: 45 ng/mL (ref 16–288)
Iron: 95 ug/dL (ref 45–160)
TIBC: 423 mcg/dL (calc) (ref 250–450)

## 2022-08-04 LAB — LIPID PANEL
Cholesterol: 191 mg/dL (ref <200)
HDL: 35 mg/dL — ABNORMAL LOW (ref >=50)
LDL Cholesterol (Calc): 103 mg/dL (calc) — ABNORMAL HIGH
Non-HDL Cholesterol (Calc): 156 mg/dL (calc) — ABNORMAL HIGH (ref <130)
Total CHOL/HDL Ratio: 5.5 (calc) — ABNORMAL HIGH (ref <5.0)
Triglycerides: 397 mg/dL — ABNORMAL HIGH (ref <150)

## 2022-08-07 DIAGNOSIS — C50312 Malignant neoplasm of lower-inner quadrant of left female breast: Secondary | ICD-10-CM | POA: Diagnosis not present

## 2022-08-07 DIAGNOSIS — Z170 Estrogen receptor positive status [ER+]: Secondary | ICD-10-CM | POA: Diagnosis not present

## 2022-08-09 DIAGNOSIS — G4733 Obstructive sleep apnea (adult) (pediatric): Secondary | ICD-10-CM | POA: Diagnosis not present

## 2022-08-09 DIAGNOSIS — C211 Malignant neoplasm of anal canal: Secondary | ICD-10-CM | POA: Diagnosis not present

## 2022-08-09 DIAGNOSIS — R0902 Hypoxemia: Secondary | ICD-10-CM | POA: Diagnosis not present

## 2022-08-09 DIAGNOSIS — J432 Centrilobular emphysema: Secondary | ICD-10-CM | POA: Diagnosis not present

## 2022-08-09 DIAGNOSIS — I270 Primary pulmonary hypertension: Secondary | ICD-10-CM | POA: Diagnosis not present

## 2022-08-10 ENCOUNTER — Inpatient Hospital Stay: Payer: Medicare HMO | Attending: Oncology | Admitting: Licensed Clinical Social Worker

## 2022-08-10 ENCOUNTER — Encounter: Payer: Self-pay | Admitting: Oncology

## 2022-08-10 ENCOUNTER — Inpatient Hospital Stay: Payer: Medicare HMO

## 2022-08-10 ENCOUNTER — Other Ambulatory Visit: Payer: Self-pay | Admitting: Family Medicine

## 2022-08-10 ENCOUNTER — Encounter: Payer: Self-pay | Admitting: Licensed Clinical Social Worker

## 2022-08-10 DIAGNOSIS — Z803 Family history of malignant neoplasm of breast: Secondary | ICD-10-CM | POA: Diagnosis not present

## 2022-08-10 DIAGNOSIS — Z170 Estrogen receptor positive status [ER+]: Secondary | ICD-10-CM | POA: Insufficient documentation

## 2022-08-10 DIAGNOSIS — C50919 Malignant neoplasm of unspecified site of unspecified female breast: Secondary | ICD-10-CM | POA: Diagnosis not present

## 2022-08-10 DIAGNOSIS — C50312 Malignant neoplasm of lower-inner quadrant of left female breast: Secondary | ICD-10-CM | POA: Insufficient documentation

## 2022-08-10 DIAGNOSIS — Z85048 Personal history of other malignant neoplasm of rectum, rectosigmoid junction, and anus: Secondary | ICD-10-CM | POA: Insufficient documentation

## 2022-08-10 NOTE — Progress Notes (Signed)
REFERRING PROVIDER: Earlie Server, MD [place],  [place] [address]  PRIMARY PROVIDER:  Steele Sizer, MD  PRIMARY REASON FOR VISIT:  1. Invasive carcinoma of breast ([place])   2. Family history of breast cancer      HISTORY OF PRESENT ILLNESS:   Ms. Dutil, a 67 y.o. female, was seen for a [place] cancer genetics consultation at the request of Dr. Tasia Catchings due to a personal and family history of cancer.  Ms. Hirth presents to clinic today to discuss the possibility of a hereditary predisposition to cancer, genetic testing, and to further clarify her future cancer risks, as well as potential cancer risks for family members.   In 2017 at the age of 25, Ms. Hafen was diagnosed with anal squamous cell carcinoma. This was treated with chemoradiation. In 2023, at the age of 60, Ms. Glennon was diagnosed with invasive carcinoma of the left breast. The treatment plan includes lumpectomy on 07/26/2022, Oncotype, adjuvant radiation and antiestrogen therapy.  CANCER HISTORY:  Oncology History  Anal squamous cell carcinoma ([place])  03/09/2016 Initial Diagnosis   Anal squamous cell carcinoma   -Stage IIIb Squamous Cell Carcinoma of the Anus: Initially cT2 N2 M0 with left inguinal node involvement. Notably was HIV negative. Completed definitive chemoradiation with concurrent 5FU/mitomycin 05/21/16, for curative intent.     Invasive carcinoma of breast ([place])  05/27/2022 Mammogram   In the left breast, a possible mass warrants further evaluation. In the right breast, no findings suspicious for malignancy. Further evaluation is suggested for a possible mass in the left breast.   06/16/2022 Mammogram   Unilateral left diagnostic mammogram  There is an indeterminate 7 mm mass in the LEFT breast at 8 o'clock 5 cm from the nipple which is favored to correspond to the site of screening mammographic concern. Recommend ultrasound-guided biopsy for definitive characterization with attention on post marker  placement mammogram to assess for mammographic/sonographic correlation.   07/05/2022 Initial Diagnosis   Invasive carcinoma of breast ([place])  -left breast lesion core biopsy Showed invasive mammary carcinoma, no special type.  Grade 1, DCIS not identified.  LVI not identified, ER/PR 90% positive, HER2 equivocal   07/09/2022 Cancer Staging   Staging form: Breast, AJCC 8th Edition - Clinical stage from 07/09/2022: Stage IA (cT1b, cN0, cM0, G1, ER+, PR+, HER2-) - Signed by Earlie Server, MD on 07/15/2022 Stage prefix: Initial diagnosis Histologic grading system: 3 grade system     RISK FACTORS:  Menarche was at age 80.  First live birth at age 6.  OCP use for approximately  less than 5  years.  Ovaries intact: no.  Hysterectomy: yes.  Menopausal status: postmenopausal.  HRT use: 2 weeks Colonoscopy: yes; normal.   Past Medical History:  Diagnosis Date   Anal squamous cell carcinoma ([place]) 03/10/2016   a.) clinical stage IIIB (T2, N2, M0); Tx'd with chemotherapy + XRT   Anemia    Anxiety    Aortic atherosclerosis (HCC)    Arthritis    Breast cancer, left ([place]) 47/04/6282   Complication of anesthesia    a.) PONV   Constipation    COPD (chronic obstructive pulmonary disease) (HCC)    Coronary artery calcification seen on CT scan    Depression    Diastolic dysfunction 66/29/4765   a.) TTE 06/08/2021: EF 55-60%; mild MR; PASP 47 mmHg; G1DD.   Emphysema of lung ([place])    GERD (gastroesophageal reflux disease)    Goals of care, counseling/discussion 07/10/2022   H/O  allergic rhinitis    Heart murmur    Hemorrhoid    History of fusion of cervical spine    a.) s/p ACDF C4-C7   History of kidney stones    Hyperlipidemia    Hyperparathyroidism ([place])    Hypertension    Macular degeneration    Methemoglobinemia 09/07/2016   Neck pain, chronic    Neuritis    OSA on CPAP 09/12/2016   Ovarian failure    Oxygen dependent    a.) 2 L/[place] (day) with increase to 4 L/[place] (night)    Personal history of chemotherapy    Personal history of radiation therapy    Pneumonitis 2017   a.) related to Daniel administration for squamous cell anal cancer   PONV (postoperative nausea and vomiting)    Proteinuria    Psoriasis    Pulmonary HTN ([place]) 06/22/2005   a.) TTE 06/22/2005: EF 61%; RVSP 31. b.) TTE 12/18/2009: EF 57%; RVSP 19.9. c.) TTE 05/20/2016: EF >55%; PASP 43. d.) TTE 04/07/2017: EF >55%; PASP 26. e.) TTE 03/12/2018: EF >55%; PASP 26. f.) TTE 06/08/2021: EF 55-60%; PASP 47.   Steatohepatitis    a.) grade II (moderate) on 09/2017 Bx   T2DM (type 2 diabetes mellitus) (HCC)    Tachycardia, paroxysmal (HCC)    Urinary incontinence     Past Surgical History:  Procedure Laterality Date   ANTERIOR AND POSTERIOR REPAIR N/A 12/28/2021   Procedure: ANTERIOR (CYSTOCELE) AND POSTERIOR REPAIR (RECTOCELE);  Surgeon: Rubie Maid, MD;  Location: [facility];  Service: Gynecology;  Laterality: N/A;   ANTERIOR CERVICAL DECOMP/DISCECTOMY FUSION N/A 2001   PROCEDURE: ACDF C4-C7   ANTERIOR FUSION LUMBAR SPINE  1990   plate in back, not metal   BREAST BIOPSY Left 07/05/2022   [place] Bx, Ribbon Clip, Path Pending   BREAST BIOPSY Left 07/05/2022   [place] LT BREAST BX W LOC DEV 1ST LESION IMG BX SPEC US GUIDE 07/05/2022 [facility]   BREAST BIOPSY Left 07/22/2022   [place] LT RADIO FREQUENCY TAG LOC US GUIDE 07/22/2022 [facility]   CARDIAC CATHETERIZATION  2009?    [facility]   CARDIAC CATHETERIZATION  05/2012   RHC: Mild hypertension 37/12 with a mean pressure of 21 mm mercury   CHOLECYSTECTOMY N/A 09/12/2017   Procedure: LAPAROSCOPIC CHOLECYSTECTOMY WITH INTRAOPERATIVE CHOLANGIOGRAM;  Surgeon: Robert Bellow, MD;  Location: [facility];  Service: General;  Laterality: N/A;   COLONOSCOPY  2018   LIVER BIOPSY N/A 09/12/2017   Procedure: LIVER BIOPSY;  Surgeon: Robert Bellow, MD;  Location: [facility];  Service: General;  Laterality: N/A;   MASS EXCISION Left 02/23/2016    Procedure: EXCISION MASS;  Surgeon: Christene Lye, MD;  Location: [facility];  Service: General;  Laterality: Left;   PART Rives BIOPSY Left 07/26/2022   Procedure: PART Taylor NODE BIOPSY;  Surgeon: Herbert Pun, MD;  Location: [facility];  Service: General;  Laterality: Left;   PORT-A-CATH REMOVAL  2018   PORTACATH PLACEMENT Left 03/12/2016   Procedure: INSERTION PORT-A-CATH;  Surgeon: Christene Lye, MD;  Location: [facility];  Service: General;  Laterality: Left;   TONSILLECTOMY Bilateral [place] Right 09/2018   Dr. Sabra Heck    TUBAL LIGATION     s/p   VAGINAL HYSTERECTOMY N/A 12/28/2021   Procedure: HYSTERECTOMY VAGINAL;  Surgeon: Rubie Maid, MD;  Location: [facility];  Service: Gynecology;  Laterality: N/A;    FAMILY HISTORY:  We obtained a  detailed, 4-generation family history.  Significant diagnoses are listed below: Family History  Problem Relation Age of Onset   Heart attack Mother    Asthma Mother    Liver cancer Father    Cancer Father    Breast cancer Maternal Aunt    Bladder Cancer Neg Hx    Kidney cancer Neg Hx    Colon cancer Neg Hx    Ms. Farias has 2 daughters, 71, and 1 son, 23, no cancers. She had 3 brothers and 1 sister. One brother passed of cancer, unknown type, but she notes it was metastatic. Another brother may have had cancer.  Ms. Castanon mother passed at 66. A maternal aunt died of breast cancer in her 65s-30s. A maternal cousin was recently diagnosed with breast cancer in her 27s. No other known cancers on this side of the family.  Ms. Rebello father passed of liver cancer at 2. No other known cancers on this side of the family.  Ms. Budden is unaware of previous family history of genetic testing for hereditary cancer risks. There is no reported Ashkenazi Jewish ancestry. There is no known  consanguinity.    GENETIC COUNSELING ASSESSMENT: Ms. Angell is a 67 y.o. female with a personal and family history of cancer which is somewhat suggestive of a hereditary cancer syndrome and predisposition to cancer. We, therefore, discussed and recommended the following at today's visit.   DISCUSSION: We discussed that approximately 10% of breast cancer is hereditary. Most cases of hereditary breast cancer are associated with BRCA1/BRCA2 genes, although there are other genes associated with hereditary cancer as well. Cancers and risks are gene specific. We discussed that testing is beneficial for several reasons including knowing about cancer risks, identifying potential screening and risk-reduction options that may be appropriate, and to understand if other family members could be at risk for cancer and allow them to undergo genetic testing.   We reviewed the characteristics, features and inheritance patterns of hereditary cancer syndromes. We also discussed genetic testing, including the appropriate family members to test, the process of testing, insurance coverage and turn-around-time for results. We discussed the implications of a negative, positive and/or variant of uncertain significant result. We recommended Ms. Bussey pursue genetic testing for the [facility] Common Hereditary Cancers+RNA gene panel.   Based on Ms. Bley's personal and family history of cancer, she meets medical criteria for genetic testing. Despite that she meets criteria, she may still have an out of pocket cost. We discussed that if her out of pocket cost for testing is over $100, the laboratory will call and confirm whether she wants to proceed with testing.  If the out of pocket cost of testing is less than $100 she will be billed by the genetic testing laboratory.   PLAN: After considering the risks, benefits, and limitations, Ms. Manahan provided informed consent to pursue genetic testing and the blood sample was sent to [facility] for analysis of the [facility] Common Hereditary Cancers+RNA panel. Results should be available within approximately 2-3 weeks' time, at which point they will be disclosed by telephone to Ms. Maden, as will any additional recommendations warranted by these results. Ms. Witting will receive a summary of her genetic counseling visit and a copy of her results once available. This information will also be available in Epic.   Ms. Bohman questions were answered to her satisfaction today. Our contact information was provided should additional questions or concerns arise. Thank you for the referral and allowing [place] to share in  the care of your patient.   Faith Rogue, MS, [facility] Genetic Counselor [email or web address] Phone: [phone]  The patient was seen for a total of 25 minutes in face-to-face genetic counseling.  Dr. Grayland Ormond was available for discussion regarding this case.   _______________________________________________________________________ For Office Staff:  Number of people involved in session: 1 Was an Intern/ student involved with case: no

## 2022-08-12 ENCOUNTER — Ambulatory Visit: Payer: Medicare Other

## 2022-08-13 ENCOUNTER — Inpatient Hospital Stay: Payer: Medicare HMO | Admitting: Oncology

## 2022-08-13 ENCOUNTER — Encounter: Payer: Self-pay | Admitting: Oncology

## 2022-08-13 ENCOUNTER — Encounter: Payer: Self-pay | Admitting: *Deleted

## 2022-08-13 VITALS — BP 133/77 | HR 79 | Temp 98.0°F | Resp 18 | Wt 184.8 lb

## 2022-08-13 DIAGNOSIS — Z170 Estrogen receptor positive status [ER+]: Secondary | ICD-10-CM | POA: Diagnosis not present

## 2022-08-13 DIAGNOSIS — Z803 Family history of malignant neoplasm of breast: Secondary | ICD-10-CM

## 2022-08-13 DIAGNOSIS — Z85048 Personal history of other malignant neoplasm of rectum, rectosigmoid junction, and anus: Secondary | ICD-10-CM | POA: Diagnosis not present

## 2022-08-13 DIAGNOSIS — C210 Malignant neoplasm of anus, unspecified: Secondary | ICD-10-CM | POA: Diagnosis not present

## 2022-08-13 DIAGNOSIS — C50919 Malignant neoplasm of unspecified site of unspecified female breast: Secondary | ICD-10-CM

## 2022-08-13 DIAGNOSIS — C50312 Malignant neoplasm of lower-inner quadrant of left female breast: Secondary | ICD-10-CM | POA: Diagnosis not present

## 2022-08-13 NOTE — Assessment & Plan Note (Signed)
She is 6 years after chemoradiation.  observation  

## 2022-08-13 NOTE — Progress Notes (Signed)
Met with patient at follow up appt. Post lumpectomy with Dr. Tasia Catchings.   Referral faxed to [facility] [facility] as patient lives in [place] and this will be more convenient for her.    She will see Dr. Tasia Catchings back 2 weeks after completion of radiation.

## 2022-08-13 NOTE — Progress Notes (Signed)
ASSESSMENT & PLAN:   Invasive carcinoma of breast ([place]) Left breast invasive mammary carcinoma pT1b pN0, s/p lumpectomy with sentinel lymph node biopsy Oncotype DX 8, Distant recurrence at 9 years 3%, chemotherapy benefit <1%, no need for adjuvant chemo Recommend adjuvant radiation- she prefers to do radiation at [facility], referral sent.  Discussed about adjuvant antiestrogen therapy followed radiation.  Check bone density   Anal squamous cell carcinoma ([place]) She is 6 years after chemoradiation.  observation   Orders Placed This Encounter  Procedures   DG Bone Density    Standing Status:   Future    Standing Expiration Date:   08/14/2023    Order Specific Question:   Reason for Exam (SYMPTOM  OR DIAGNOSIS REQUIRED)    Answer:   breast cancer    Order Specific Question:   Preferred imaging location?    Answer:    [facility]   Follow up 2 weeks after finishing RT All questions were answered. The patient knows to call the clinic with any problems, questions or concerns.  Earlie Server, MD, PhD [facility] Hematology Oncology 08/13/2022       REFERRING PROVIDER: Steele Sizer, MD   Patient Care Team: Steele Sizer, MD as PCP - General (Family Medicine) Lucilla Lame, MD as Consulting Physician (Gastroenterology) Carolin Coy, MD as Radiation Oncologist (Radiation Oncology) Gabriel Carina Betsey Holiday, MD as Physician Assistant (Endocrinology) Jimmye Norman, MD (Inactive) as Resident (Pulmonary Disease) Franciso Bend, MD (Cardiology) Daiva Huge, RN as Oncology Nurse Navigator  CHIEF COMPLAINTS/PURPOSE OF CONSULTATION:  Left breast invasive cancer   HISTORY OF PRESENTING ILLNESS:  Judy Allen 67 y.o. female presents to establish care for left breast invasive cancer.  I have reviewed her chart and materials related to her cancer extensively and collaborated history with the patient. Summary of oncologic history is as follows: Oncology History  Anal squamous cell carcinoma  ([place])  03/09/2016 Initial Diagnosis   Anal squamous cell carcinoma   -Stage IIIb Squamous Cell Carcinoma of the Anus: Initially cT2 N2 M0 with left inguinal node involvement. Notably was HIV negative. Completed definitive chemoradiation with concurrent 5FU/mitomycin 05/21/16, for curative intent.     Invasive carcinoma of breast ([place])  05/27/2022 Mammogram   In the left breast, a possible mass warrants further evaluation. In the right breast, no findings suspicious for malignancy. Further evaluation is suggested for a possible mass in the left breast.   06/16/2022 Mammogram   Unilateral left diagnostic mammogram  There is an indeterminate 7 mm mass in the LEFT breast at 8 o'clock 5 cm from the nipple which is favored to correspond to the site of screening mammographic concern. Recommend ultrasound-guided biopsy for definitive characterization with attention on post marker placement mammogram to assess for mammographic/sonographic correlation.   07/05/2022 Initial Diagnosis   Invasive carcinoma of breast ([place])  -left breast lesion core biopsy Showed invasive mammary carcinoma, no special type.  Grade 1, DCIS not identified.  LVI not identified, ER/PR 90% positive, HER2 equivocal IHC 2+, HER2 FISH negative.    07/09/2022 Cancer Staging   Staging form: Breast, AJCC 8th Edition - Clinical stage from 07/09/2022: Stage IA (cT1b, cN0, cM0, G1, ER+, PR+, HER2-) - Signed by Earlie Server, MD on 07/15/2022 Stage prefix: Initial diagnosis Histologic grading system: 3 grade system   07/26/2022 Surgery   Patient underwent left breast lumpectomy and SLNB  invasive mammary carcinoma, no special type.  Grade 1, DCIS+ low grade, all 3 sentinel lymph nodes negative.  All margins negative.  pT1b pN0   07/26/2022 Oncotype testing   Oncotype Dx 8, Distant recurrence at 9 years 3%, chemotherapy benefit <1%   07/26/2022 Cancer Staging   Staging form: Breast, AJCC 8th Edition - Pathologic stage from  07/26/2022: Stage IA (pT1b, pN0, cM0, G1, ER+, PR+, HER2-, Oncotype DX score: 8) - Signed by Earlie Server, MD on 08/13/2022 Stage prefix: Initial diagnosis Multigene prognostic tests performed: Oncotype DX Recurrence score range: Less than 11 Histologic grading system: 3 grade system    Menarche 67 years of age Patient has 3 children. Age at first childbirth 64 years old, Previous OCP use less than 5 years Postmenopausal, last menstrual period July 2003-age of 45, Patient has had a hysterectomy. She has a history of brief hormone replacement therapy, 2 weeks, discontinued due to intolerance. No previous breast biopsies.  Family history positive for maternal aunt with breast cancer.  Father with liver cancer. Patient has history of chronic respiratory failure due to pulmonary hypertension.  Patient is on nasal cannula oxygen.  She follows up with [facility] pulmonology  MEDICAL HISTORY:  Past Medical History:  Diagnosis Date   Anal squamous cell carcinoma ([place]) 03/10/2016   a.) clinical stage IIIB (T2, N2, M0); Tx'd with chemotherapy + XRT   Anemia    Anxiety    Aortic atherosclerosis (HCC)    Arthritis    Breast cancer, left ([place]) 02/02/9484   Complication of anesthesia    a.) PONV   Constipation    COPD (chronic obstructive pulmonary disease) (HCC)    Coronary artery calcification seen on CT scan    Depression    Diastolic dysfunction 46/27/0350   a.) TTE 06/08/2021: EF 55-60%; mild MR; PASP 47 mmHg; G1DD.   Emphysema of lung ([place])    GERD (gastroesophageal reflux disease)    Goals of care, counseling/discussion 07/10/2022   H/O allergic rhinitis    Heart murmur    Hemorrhoid    History of fusion of cervical spine    a.) s/p ACDF C4-C7   History of kidney stones    Hyperlipidemia    Hyperparathyroidism ([place])    Hypertension    Macular degeneration    Methemoglobinemia 09/07/2016   Neck pain, chronic    Neuritis    OSA on CPAP 09/12/2016   Ovarian failure    Oxygen dependent     a.) 2 L/[place] (day) with increase to 4 L/[place] (night)   Personal history of chemotherapy    Personal history of radiation therapy    Pneumonitis 2017   a.) related to Curran administration for squamous cell anal cancer   PONV (postoperative nausea and vomiting)    Proteinuria    Psoriasis    Pulmonary HTN ([place]) 06/22/2005   a.) TTE 06/22/2005: EF 61%; RVSP 31. b.) TTE 12/18/2009: EF 57%; RVSP 19.9. c.) TTE 05/20/2016: EF >55%; PASP 43. d.) TTE 04/07/2017: EF >55%; PASP 26. e.) TTE 03/12/2018: EF >55%; PASP 26. f.) TTE 06/08/2021: EF 55-60%; PASP 47.   Steatohepatitis    a.) grade II (moderate) on 09/2017 Bx   T2DM (type 2 diabetes mellitus) (HCC)    Tachycardia, paroxysmal (HCC)    Urinary incontinence     SURGICAL HISTORY: Past Surgical History:  Procedure Laterality Date   ANTERIOR AND POSTERIOR REPAIR N/A 12/28/2021   Procedure: ANTERIOR (CYSTOCELE) AND POSTERIOR REPAIR (RECTOCELE);  Surgeon: Rubie Maid, MD;  Location: [facility];  Service: Gynecology;  Laterality: N/A;   ANTERIOR CERVICAL DECOMP/DISCECTOMY FUSION N/A 2001   PROCEDURE: ACDF C4-C7  ANTERIOR FUSION LUMBAR SPINE  1990   plate in back, not metal   BREAST BIOPSY Left 07/05/2022   [place] Bx, Ribbon Clip, Path Pending   BREAST BIOPSY Left 07/05/2022   [place] LT BREAST BX W LOC DEV 1ST LESION IMG BX SPEC US GUIDE 07/05/2022 [facility]   BREAST BIOPSY Left 07/22/2022   [place] LT RADIO FREQUENCY TAG LOC US GUIDE 07/22/2022 [facility]   CARDIAC CATHETERIZATION  2009?    [facility]   CARDIAC CATHETERIZATION  05/2012   RHC: Mild hypertension 37/12 with a mean pressure of 21 mm mercury   CHOLECYSTECTOMY N/A 09/12/2017   Procedure: LAPAROSCOPIC CHOLECYSTECTOMY WITH INTRAOPERATIVE CHOLANGIOGRAM;  Surgeon: Robert Bellow, MD;  Location: [facility];  Service: General;  Laterality: N/A;   COLONOSCOPY  2018   LIVER BIOPSY N/A 09/12/2017   Procedure: LIVER BIOPSY;  Surgeon: Robert Bellow, MD;  Location: [facility];   Service: General;  Laterality: N/A;   MASS EXCISION Left 02/23/2016   Procedure: EXCISION MASS;  Surgeon: Christene Lye, MD;  Location: [facility];  Service: General;  Laterality: Left;   PART [place] BIOPSY Left 07/26/2022   Procedure: PART [place] NODE BIOPSY;  Surgeon: Herbert Pun, MD;  Location: [facility];  Service: General;  Laterality: Left;   PORT-A-CATH REMOVAL  2018   PORTACATH PLACEMENT Left 03/12/2016   Procedure: INSERTION PORT-A-CATH;  Surgeon: Christene Lye, MD;  Location: [facility];  Service: General;  Laterality: Left;   TONSILLECTOMY Bilateral [place] Right 09/2018   Dr. Sabra Heck    TUBAL LIGATION     s/p   VAGINAL HYSTERECTOMY N/A 12/28/2021   Procedure: HYSTERECTOMY VAGINAL;  Surgeon: Rubie Maid, MD;  Location: [facility];  Service: Gynecology;  Laterality: N/A;    SOCIAL HISTORY: Social History   Socioeconomic History   Marital status: Widowed    Spouse name: Not on file   Number of children: 3   Years of education: Not on file   Highest education level: 11th grade  Occupational History   Not on file  Tobacco Use   Smoking status: Former    Packs/day: 1.00    Years: 32.00    Total pack years: 32.00    Types: Cigarettes    Start date: 08/10/1963    Quit date: 01/15/2006    Years since quitting: 16.5   Smokeless tobacco: Never  Vaping Use   Vaping Use: Never used  Substance and Sexual Activity   Alcohol use: No   Drug use: No   Sexual activity: Not Currently    Birth control/protection: Surgical  Other Topics Concern   Not on file  Social History Narrative   Husband passed away on 2018-01-09   She was living with [name] for few years, but moved in with Candace her oldest daughter this past Summer.    Social Determinants of Health   Financial Resource Strain: Low Risk  (08/06/2021)   Overall Financial  Resource Strain (CARDIA)    Difficulty of Paying Living Expenses: Not very hard  Food Insecurity: No Food Insecurity (07/26/2022)   Hunger Vital Sign    Worried About Running Out of Food in the Last Year: Never true    Ran Out of Food in the Last Year: Never true  Transportation Needs: No Transportation Needs (07/26/2022)   PRAPARE - Hydrologist (Medical): No    Lack of Transportation (Non-Medical): No  Physical  Activity: Inactive (08/06/2021)   Exercise Vital Sign    Days of Exercise per Week: 0 days    Minutes of Exercise per Session: 0 min  Stress: No Stress Concern Present (08/06/2021)   [place]    Feeling of Stress : Not at all  Social Connections: Socially Isolated (08/06/2021)   Social Connection and Isolation Panel [NHANES]    Frequency of Communication with Friends and Family: More than three times a week    Frequency of Social Gatherings with Friends and Family: More than three times a week    Attends Religious Services: Never    Marine scientist or Organizations: No    Attends Archivist Meetings: Never    Marital Status: Widowed  Intimate Partner Violence: Not At Risk (07/26/2022)   Humiliation, Afraid, Rape, and Kick questionnaire    Fear of Current or Ex-Partner: No    Emotionally Abused: No    Physically Abused: No    Sexually Abused: No    FAMILY HISTORY: Family History  Problem Relation Age of Onset   Heart attack Mother    Asthma Mother    Liver cancer Father 31   Cancer Brother        unk type; metastatic   Breast cancer Maternal Aunt        dx 20s-30s   Breast cancer Cousin        dx 75s   Bladder Cancer Neg Hx    Kidney cancer Neg Hx    Colon cancer Neg Hx     ALLERGIES:  is allergic to aczone [dapsone], atarax [hydroxyzine], hydrochlorothiazide, sulfa antibiotics, and sulfasalazine.  MEDICATIONS:  Current Outpatient Medications   Medication Sig Dispense Refill   albuterol (PROVENTIL HFA;VENTOLIN HFA) 108 (90 Base) MCG/ACT inhaler Inhale 2 puffs into the lungs every 6 (six) hours as needed for wheezing or shortness of breath.     Buprenorphine HCl (BELBUCA) 450 MCG FILM Place 1 Film (450 mcg total) inside cheek every 12 (twelve) hours. 60 each 1   busPIRone (BUSPAR) 7.5 MG tablet Take 1 tablet (7.5 mg total) by mouth 3 (three) times daily. 90 tablet 0   Cholecalciferol (VITAMIN D3) 1000 units CAPS Take 1 capsule by mouth daily.     EFFEXOR XR 75 MG 24 hr capsule TAKE 3 CAPSULES DAILY WITH BREAKFAST 270 capsule 1   gabapentin (NEURONTIN) 400 MG capsule Take 1 capsule (400 mg total) by mouth 3 (three) times daily. 90 capsule 2   metoprolol succinate (TOPROL-XL) 50 MG 24 hr tablet Take 50 mg by mouth in the morning.     Multiple Vitamins-Minerals (EYE VITAMINS & MINERALS PO) Take 1 capsule by mouth in the morning. Eye[employer] Eye Vitamins     NIFEdipine (PROCARDIA-XL/NIFEDICAL-XL) 30 MG 24 hr tablet Take 30 mg by mouth at bedtime.     omega-3 acid ethyl esters (LOVAZA) 1 g capsule TAKE 2 CAPSULES BY MOUTH TWO TIMES A DAY 360 capsule 1   omeprazole (PRILOSEC) 40 MG capsule TAKE 1 CAPSULE DAILY 90 capsule 3   OXYGEN Inhale 2-4 L into the lungs continuous. 2 L qd and 4 L qhs     Potassium Gluconate 80 MG TABS Take 1 tablet by mouth daily.     telmisartan (MICARDIS) 80 MG tablet Take 80 mg by mouth at bedtime.     tiZANidine (ZANAFLEX) 4 MG tablet Take 1 tablet (4 mg total) by mouth every 8 (eight) hours as needed.  270 tablet 2   torsemide (DEMADEX) 5 MG tablet Take 5 mg by mouth every morning.     umeclidinium-vilanterol (ANORO ELLIPTA) 62.5-25 MCG/ACT AEPB Inhale 1 puff into the lungs in the morning.     No current facility-administered medications for this visit.    Review of Systems  Constitutional:  Negative for appetite change, chills, fatigue and fever.  HENT:   Negative for hearing loss and voice change.   Eyes:   Negative for eye problems.  Respiratory:  Positive for shortness of breath. Negative for chest tightness and cough.   Cardiovascular:  Negative for chest pain.  Gastrointestinal:  Negative for abdominal distention, abdominal pain and blood in stool.  Endocrine: Negative for hot flashes.  Genitourinary:  Negative for difficulty urinating and frequency.   Musculoskeletal:  Negative for arthralgias.  Skin:  Negative for itching and rash.  Neurological:  Negative for extremity weakness.  Hematological:  Negative for adenopathy.  Psychiatric/Behavioral:  Negative for confusion.    [place] Kitchen    PHYSICAL EXAMINATION: ECOG PERFORMANCE STATUS: 1 - Symptomatic but completely ambulatory  Vitals:   08/13/22 1040  BP: 133/77  Pulse: 79  Resp: 18  Temp: 98 F (36.7 C)  SpO2: 95%   Filed Weights   08/13/22 1040  Weight: 184 lb 12.8 oz (83.8 kg)    Physical Exam Constitutional:      General: She is not in acute distress.    Appearance: She is not diaphoretic.  HENT:     Head: Normocephalic and atraumatic.     Nose: Nose normal.     Mouth/Throat:     Pharynx: No oropharyngeal exudate.  Eyes:     General: No scleral icterus.    Pupils: Pupils are equal, round, and reactive to light.  Cardiovascular:     Rate and Rhythm: Normal rate and regular rhythm.     Heart sounds: No murmur heard. Pulmonary:     Effort: Pulmonary effort is normal. No respiratory distress.     Breath sounds: No rales.  Chest:     Chest wall: No tenderness.  Abdominal:     General: There is no distension.     Palpations: Abdomen is soft.     Tenderness: There is no abdominal tenderness.  Musculoskeletal:        General: Normal range of motion.     Cervical back: Normal range of motion and neck supple.  Skin:    General: Skin is warm and dry.     Findings: No erythema.  Neurological:     Mental Status: She is alert and oriented to person, place, and time.     Cranial Nerves: No cranial nerve deficit.      Motor: No abnormal muscle tone.     Coordination: Coordination normal.  Psychiatric:        Mood and Affect: Affect normal.    Breast exam was performed in seated and lying down position. Patient is status post left breast biopsy, focal bruising at biopsy site. .  No palpable breast masses bilaterally.  No palpable axillary adenopathy bilaterally  LABORATORY DATA:  I have reviewed the data as listed    Latest Ref Rng & Units 08/03/2022    3:26 PM 07/27/2022    5:38 AM 07/09/2022   10:44 AM  CBC  WBC 3.8 - 10.8 Thousand/uL 8.8  9.0  9.1   Hemoglobin 11.7 - 15.5 g/dL 11.6  10.1  12.4   Hematocrit 35.0 - 45.0 % 34.1  31.1  37.8   Platelets 140 - 400 Thousand/uL 281  165  276       Latest Ref Rng & Units 08/03/2022    3:26 PM 07/27/2022    5:38 AM 07/09/2022   10:44 AM  CMP  Glucose 65 - 99 mg/dL 97  149  111   BUN 7 - 25 mg/dL '11  16  17   '$ Creatinine 0.50 - 1.05 mg/dL 0.79  0.87  0.85   Sodium 135 - 146 mmol/L 143  141  140   Potassium 3.5 - 5.3 mmol/L 4.4  4.0  4.4   Chloride 98 - 110 mmol/L 103  108  102   CO2 20 - 32 mmol/L '31  28  29   '$ Calcium 8.6 - 10.4 mg/dL 9.4  8.7  9.3   Total Protein 6.1 - 8.1 g/dL 7.1   7.8   Total Bilirubin 0.2 - 1.2 mg/dL 0.4   0.5   Alkaline Phos 38 - 126 U/L   95   AST 10 - 35 U/L 46   75   ALT 6 - 29 U/L 47   82      RADIOGRAPHIC STUDIES: I have personally reviewed the radiological images as listed and agreed with the findings in the report. MM Breast Surgical Specimen  Result Date: 07/26/2022 CLINICAL DATA:  Specimen radiograph status post left breast lumpectomy. EXAM: SPECIMEN RADIOGRAPH OF THE LEFT BREAST COMPARISON:  Previous exam(s). FINDINGS: Status post excision of the left breast. The radiofrequency tag and ribbon shaped clip are present within the specimen. No OR callback was requested. IMPRESSION: Specimen radiograph of the left breast. Electronically Signed   By: Ammie Ferrier M.D.   On: 07/26/2022 15:49  NM Sentinel Node  Inj-No Rpt (Breast)  Result Date: 07/26/2022 Sulfur Colloid was injected by the Nuclear Medicine Technologist for sentinel lymph node localization.   [place] LT RADIO FREQUENCY TAG LOC US GUIDE  Result Date: 07/22/2022 CLINICAL DATA:  Patient is status post ultrasound-guided biopsy LEFT breast mass at 8 o'clock which demonstrated invasive mammary carcinoma (RIBBON clip). EXAM: NEEDLE LOCALIZATION OF THE LEFT BREAST WITH ULTRASOUND GUIDANCE COMPARISON:  Previous exam(s). FINDINGS: Patient presents for needle localization prior to lumpectomy. I met with the patient and we discussed the procedure of needle localization including benefits and alternatives. We discussed the high likelihood of a successful procedure. We discussed the risks of the procedure, including infection, bleeding, tissue injury, and further surgery. Informed, written consent was given. The usual time-out protocol was performed immediately prior to the procedure. Using ultrasound guidance, sterile technique, 1% lidocaine and a 5 cm radiofrequency tag needle ([id number]), the mass at 8 o'clock was localized using a medial approach. Auditory signal was confirmed. The images were marked for Dr. Windell Moment. IMPRESSION: Radiofrequency tag localization of the LEFT breast. No apparent complications. Electronically Signed   By: Valentino Saxon M.D.   On: 07/22/2022 16:37   MM DIAG BREAST TOMO UNI LEFT  Result Date: 07/22/2022 CLINICAL DATA:  Status post RF ID tag placement EXAM: DIAGNOSTIC LEFT MAMMOGRAM POST ULTRASOUND-GUIDED RADIOFREQUENCY TAG PLACEMENT COMPARISON:  Previous exam(s). FINDINGS: Mammographic images were obtained following ultrasound-guided radiofrequency tag placement. These demonstrate a radiofrequency tag ([id number]) in association with a mass in the LEFT inner breast and a RIBBON biopsy marking clip. IMPRESSION: Appropriate location of the radiofrequency tag. Final Assessment: Post Procedure Mammograms for radiofrequency tag  placement Electronically Signed   By: Valentino Saxon M.D.   On: 07/22/2022 16:28   ASSESSMENT & PLAN:  Invasive carcinoma of breast ([place]) Left breast invasive mammary carcinoma pT1b pN0, s/p lumpectomy with sentinel lymph node biopsy Oncotype DX 8, Distant recurrence at 9 years 3%, chemotherapy benefit <1%, no need for adjuvant chemo Recommend adjuvant radiation- she prefers to do radiation at [facility], referral sent.  Discussed about adjuvant antiestrogen therapy followed radiation.     Anal squamous cell carcinoma ([place]) She is 6 years after chemoradiation.  observation     Orders Placed This Encounter  Procedures   DG Bone Density    Standing Status:   Future    Standing Expiration Date:   08/14/2023    Order Specific Question:   Reason for Exam (SYMPTOM  OR DIAGNOSIS REQUIRED)    Answer:   breast cancer    Order Specific Question:   Preferred imaging location?    Answer:   [facility]    Follow up to be determined.   All questions were answered. The patient knows to call the clinic with any problems, questions or concerns.  Earlie Server, MD 08/13/2022

## 2022-08-13 NOTE — Assessment & Plan Note (Addendum)
Left breast invasive mammary carcinoma pT1b pN0, s/p lumpectomy with sentinel lymph node biopsy Oncotype DX 8, Distant recurrence at 9 years 3%, chemotherapy benefit <1%, no need for adjuvant chemo Recommend adjuvant radiation- she prefers to do radiation at [facility], referral sent.  Discussed about adjuvant antiestrogen therapy followed radiation.  Check bone density

## 2022-08-18 NOTE — Progress Notes (Signed)
Pt indicates nausea after surgery and that is the indication for the medication.

## 2022-08-20 ENCOUNTER — Ambulatory Visit: Payer: Self-pay | Admitting: Licensed Clinical Social Worker

## 2022-08-20 ENCOUNTER — Encounter: Payer: Self-pay | Admitting: Licensed Clinical Social Worker

## 2022-08-20 ENCOUNTER — Telehealth: Payer: Self-pay | Admitting: Licensed Clinical Social Worker

## 2022-08-20 ENCOUNTER — Other Ambulatory Visit: Payer: Medicare HMO

## 2022-08-20 DIAGNOSIS — Z1379 Encounter for other screening for genetic and chromosomal anomalies: Secondary | ICD-10-CM

## 2022-08-20 DIAGNOSIS — Z79899 Other long term (current) drug therapy: Secondary | ICD-10-CM | POA: Insufficient documentation

## 2022-08-20 NOTE — Progress Notes (Signed)
HPI:   Judy Allen was previously seen in the [facility] due to a personal and family history of cancer and concerns regarding a hereditary predisposition to cancer. Please refer to our prior cancer genetics clinic note for more information regarding our discussion, assessment and recommendations, at the time. Judy Allen recent genetic test results were disclosed to her, as were recommendations warranted by these results. These results and recommendations are discussed in more detail below.  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 Notably was HIV negative. Completed definitive chemoradiation with concurrent 5FU/mitomycin 05/21/16, for curative intent.     Invasive carcinoma of breast ([place])  05/27/2022 Mammogram   In the left breast, a possible mass warrants further evaluation. In the right breast, no findings suspicious for malignancy. Further evaluation is suggested for a possible mass in the left breast.   06/16/2022 Mammogram   Unilateral left diagnostic mammogram  There is an indeterminate 7 mm mass in the LEFT breast at 8 o'clock 5 cm from the nipple which is favored to correspond to the site of screening mammographic concern. Recommend ultrasound-guided biopsy for definitive characterization with attention on post marker placement mammogram to assess for mammographic/sonographic correlation.   07/05/2022 Initial Diagnosis   Invasive carcinoma of breast ([place])  -left breast lesion core biopsy Showed invasive mammary carcinoma, no special type.  Grade 1, DCIS not identified.  LVI not identified, ER/PR 90% positive, HER2 equivocal IHC 2+, HER2 FISH negative.    07/09/2022 Cancer Staging   Staging form: Breast, AJCC 8th Edition - Clinical stage from 07/09/2022: Stage IA  (cT1b, cN0, cM0, G1, ER+, PR+, HER2-) - Signed by Earlie Server, MD on 07/15/2022 Stage prefix: Initial diagnosis Histologic grading system: 3 grade system   07/26/2022 Surgery   Patient underwent left breast lumpectomy and SLNB  invasive mammary carcinoma, no special type.  Grade 1, DCIS+ low grade, all 3 sentinel lymph nodes negative.  All margins negative. pT1b pN0   07/26/2022 Oncotype testing   Oncotype Dx 8, Distant recurrence at 9 years 3%, chemotherapy benefit <1%   07/26/2022 Cancer Staging   Staging form: Breast, AJCC 8th Edition - Pathologic stage from 07/26/2022: Stage IA (pT1b, pN0, cM0, G1, ER+, PR+, HER2-, Oncotype DX score: 8) - Signed by Earlie Server, MD on 08/13/2022 Stage prefix: Initial diagnosis Multigene prognostic tests performed: Oncotype DX Recurrence score range: Less than 11 Histologic grading system: 3 grade system    Genetic Testing   Negative genetic testing. No pathogenic variants identified on the [facility] Common Hereditary Cancers+RNA panel. VUS in POLE called c.623C>G identified. The report date is 08/18/2022.  The Common Hereditary Cancers Panel + RNA offered by [facility] includes sequencing and/or deletion duplication testing of the following 48 genes: APC*, ATM*, AXIN2, BAP1, BARD1, BMPR1A, BRCA1, BRCA2, BRIP1, CDH1, CDK4, CDKN2A (p14ARF), CDKN2A (p16INK4a), CHEK2, CTNNA1, DICER1*, EPCAM*, FH*, GREM1*, HOXB13, KIT, MBD4, MEN1*, MLH1*, MSH2*, MSH3*, MSH6*, MUTYH, NF1*, NTHL1, PALB2, PDGFRA, PMS2*, POLD1*, POLE, PTEN*, RAD51C, RAD51D, SDHA*, SDHB, SDHC*, SDHD, SMAD4, SMARCA4, STK11, TP53, TSC1*, TSC2, VHL.      FAMILY HISTORY:  We obtained a detailed, 4-generation family history.  Significant diagnoses are listed below: Family History  Problem Relation Age of Onset   Heart attack Mother    Asthma Mother    Liver cancer Father  6   Cancer Brother        unk type; metastatic   Breast cancer Maternal Aunt        dx 20s-30s   Breast cancer Cousin        dx 77s    Bladder Cancer Neg Hx    Kidney cancer Neg Hx    Colon cancer Neg Hx    Judy Allen has 2 daughters, 34, and 1 son, 39, no cancers. She had 3 brothers and 1 sister. One brother passed of cancer, unknown type, but she notes it was metastatic. Another brother may have had cancer.   Judy Allen mother passed at 37. A maternal aunt died of breast cancer in her 82s-30s. A maternal cousin was recently diagnosed with breast cancer in her 64s. No other known cancers on this side of the family.   Judy Allen father passed of liver cancer at 51. No other known cancers on this side of the family.   Judy Allen is unaware of previous family history of genetic testing for hereditary cancer risks. There is no reported Ashkenazi Jewish ancestry. There is no known consanguinity.      GENETIC TEST RESULTS:  The [facility] Common Hereditary Cancers+RNA Panel found no pathogenic mutations.   The Common Hereditary Cancers Panel + RNA offered by [facility] includes sequencing and/or deletion duplication testing of the following 48 genes: APC*, ATM*, AXIN2, BAP1, BARD1, BMPR1A, BRCA1, BRCA2, BRIP1, CDH1, CDK4, CDKN2A (p14ARF), CDKN2A (p16INK4a), CHEK2, CTNNA1, DICER1*, EPCAM*, FH*, GREM1*, HOXB13, KIT, MBD4, MEN1*, MLH1*, MSH2*, MSH3*, MSH6*, MUTYH, NF1*, NTHL1, PALB2, PDGFRA, PMS2*, POLD1*, POLE, PTEN*, RAD51C, RAD51D, SDHA*, SDHB, SDHC*, SDHD, SMAD4, SMARCA4, STK11, TP53, TSC1*, TSC2, VHL.   The test report has been scanned into EPIC and is located under the Molecular Pathology section of the Results Review tab.  A portion of the result report is included below for reference. Genetic testing reported out on 08/18/2022.      Genetic testing identified a variant of uncertain significance (VUS) in the POLE gene called c.623C>G.  At this time, it is unknown if this variant is associated with an increased risk for cancer or if it is benign, but most uncertain variants are reclassified to benign. It should not be used to  make medical management decisions. With time, we suspect the laboratory will determine the significance of this variant, if any. If the laboratory reclassifies this variant, we will attempt to contact Judy Allen to discuss it further.   Even though a pathogenic variant was not identified, possible explanations for the cancer in the family may include: There may be no hereditary risk for cancer in the family. The cancers in Judy Allen and/or her family may be sporadic/familial or due to other genetic and environmental factors. There may be a gene mutation in one of these genes that current testing methods cannot detect but that chance is small. There could be another gene that has not yet been discovered, or that we have not yet tested, that is responsible for the cancer diagnoses in the family.  It is also possible there is a hereditary cause for the cancer in the family that Judy Allen did not inherit.  Therefore, it is important to remain in touch with cancer genetics in the future so that we can continue to offer Judy Allen the most up to date genetic testing.   ADDITIONAL GENETIC TESTING:  We discussed with Judy Allen that her genetic testing was fairly extensive.  If there are additional  relevant genes identified to increase cancer risk that can be analyzed in the future, we would be happy to discuss and coordinate this testing at that time.    CANCER SCREENING RECOMMENDATIONS:  Judy Allen test result is considered negative (normal).  This means that we have not identified a hereditary cause for her personal and family history of cancer at this time.   An individual's cancer risk and medical management are not determined by genetic test results alone. Overall cancer risk assessment incorporates additional factors, including personal medical history, family history, and any available genetic information that may result in a personalized plan for cancer prevention and surveillance. Therefore, it is  recommended she continue to follow the cancer management and screening guidelines provided by her oncology and primary healthcare provider.  RECOMMENDATIONS FOR FAMILY MEMBERS:   Since she did not inherit a identifiable mutation in a cancer predisposition gene included on this panel, her children could not have inherited a known mutation from her in one of these genes. Individuals in this family might be at some increased risk of developing cancer, over the general population risk, due to the family history of cancer.  Individuals in the family should notify their providers of the family history of cancer. We recommend women in this family have a yearly mammogram beginning at age 12, or 62 years younger than the earliest onset of cancer, an annual clinical breast exam, and perform monthly breast self-exams.  Family members should have colonoscopies by at age 68, or earlier, as recommended by their providers. Other members of the family may still carry a pathogenic variant in one of these genes that Judy Allen did not inherit. Based on the family history, we recommend her maternal relatives, especially those closely related to her aunt who had breast cancer in her 47s-30s, have genetic counseling and testing. Judy Allen will let us know if we can be of any assistance in coordinating genetic counseling and/or testing for this family member.   We do not recommend familial testing for the POLE variant of uncertain significance (VUS).  FOLLOW-UP:  Lastly, we discussed with Ms. Puskarich that cancer genetics is a rapidly advancing field and it is possible that new genetic tests will be appropriate for her and/or her family members in the future. We encouraged her to remain in contact with cancer genetics on an annual basis so we can update her personal and family histories and let her know of advances in cancer genetics that may benefit this family.   Our contact number was provided. Ms. Games questions were  answered to her satisfaction, and she knows she is welcome to call us at anytime with additional questions or concerns.    Faith Rogue, MS, [facility] Genetic Counselor [email or web address] Phone: [phone]

## 2022-08-20 NOTE — Telephone Encounter (Signed)
I contacted [name] to discuss her genetic testing results. No pathogenic variants were identified in the 48 genes analyzed. Detailed clinic note to follow.   The test report has been scanned into EPIC and is located under the Molecular Pathology section of the Results Review tab.  A portion of the result report is included below for reference.      Judy Rogue, MS, [facility] Genetic Counselor [email or web address] Phone: [phone]

## 2022-08-24 DIAGNOSIS — Z170 Estrogen receptor positive status [ER+]: Secondary | ICD-10-CM | POA: Diagnosis not present

## 2022-08-24 DIAGNOSIS — C50912 Malignant neoplasm of unspecified site of left female breast: Secondary | ICD-10-CM | POA: Diagnosis not present

## 2022-08-25 ENCOUNTER — Encounter: Payer: Self-pay | Admitting: *Deleted

## 2022-08-25 ENCOUNTER — Telehealth: Payer: Self-pay | Admitting: *Deleted

## 2022-08-25 ENCOUNTER — Other Ambulatory Visit: Payer: Self-pay | Admitting: Oncology

## 2022-08-25 MED ORDER — ANASTROZOLE 1 MG PO TABS: 1.0000 mg | ORAL_TABLET | Freq: Every day | ORAL | 2 refills | Status: DC

## 2022-08-25 NOTE — Progress Notes (Signed)
Ms. Sieg met with radiation oncology at [facility] and they did not recommend radiation but instead to begin taking aromatase inhibitor.   Dr. Tasia Catchings will call in Rx and see her back in 3 months.  She knows to call if she begins having any side effects or would like to be seen sooner.

## 2022-08-25 NOTE — Telephone Encounter (Signed)
Spoke with Judy Allen.   She said the [place] oncologist did not recommend radiation and thought she should go ahead and start aromatase inhibitor.   Relayed this information to Dr. Tasia Catchings, she will send in Rx for AI and see patient back in 3 months.   [name] knows to call if she starts having side effects or would like to be seen sooner.

## 2022-08-25 NOTE — Telephone Encounter (Signed)
Patient called and left message that she met with the Radiation physician at [facility] and they have a plan. She is asking to speak with Dr Tasia Catchings about this and requests a return call.

## 2022-08-26 ENCOUNTER — Ambulatory Visit: Payer: Medicare Other

## 2022-08-27 ENCOUNTER — Ambulatory Visit: Payer: Self-pay

## 2022-08-27 NOTE — Telephone Encounter (Signed)
  Chief Complaint: Anxiety Symptoms: Restless, anxious Frequency: 4 days. Pertinent Negatives: Patient denies self harm Disposition: '[]'$ ED /'[x]'$ Urgent Care (no appt availability in office) / '[x]'$ Appointment(In office/virtual)/ '[]'$  [place] [facility]/ '[]'$ Home Care/ '[]'$ Refused Recommended Disposition /'[]'$ [place] [facility]/ '[x]'$  Follow-up with PCP Additional Notes: Pt states that she recently stopped her pain medications, and recently started cancer treatment. PT states that she feels anxious, restless, has a song stuck in her head and is scraping her tongue on her teeth.   PT only wants to see Dr. Ancil Boozer. Appt made for next Friday  - first available. Pt has vv on Monday.  Gave pt phone number to [facility] in [place]. PT will call them. PT will go to [place] if needed. Pt would like to see seen sooner if possible only with Dr, Ancil Boozer.  Reason for Disposition  [1] Anxiety symptoms AND [2] has not been evaluated for this by doctor (or NP/PA)  MODERATE anxiety (e.g., persistent or frequent anxiety symptoms; interferes with sleep, school, or work)  Answer Assessment - Initial Assessment Questions 1. CONCERN: "Did anything happen that prompted you to call today?"      Tired of it 2. ANXIETY SYMPTOMS: "Can you describe how you (your loved one; patient) have been feeling?" (e.g., tense, restless, panicky, anxious, keyed up, overwhelmed, sense of impending doom).      Restless, anxious fidgety, song stuck in head. Scraping tongue on teeth 3. ONSET: "How long have you been feeling this way?" (e.g., hours, days, weeks)     4 days 4. SEVERITY: "How would you rate the level of anxiety?" (e.g., 0 - 10; or mild, moderate, severe).     10/10 5. FUNCTIONAL IMPAIRMENT: "How have these feelings affected your ability to do daily activities?" "Have you had more difficulty than usual doing your normal daily activities?" (e.g., getting better, same, worse; self-care, school, work, interactions)     Some 6. HISTORY: "Have  you felt this way before?" "Have you ever been diagnosed with an anxiety problem in the past?" (e.g., generalized anxiety disorder, panic attacks, PTSD). If Yes, ask: "How was this problem treated?" (e.g., medicines, counseling, etc.)     yes 7. RISK OF HARM - SUICIDAL IDEATION: "Do you ever have thoughts of hurting or killing yourself?" If Yes, ask:  "Do you have these feelings now?" "Do you have a plan on how you would do this?"     no 8. TREATMENT:  "What has been done so far to treat this anxiety?" (e.g., medicines, relaxation strategies). "What has helped?"     Medicine in the past. Effexor and Buspar - not working 9. TREATMENT - THERAPIST: "Do you have a counselor or therapist? Name?"     no 10. POTENTIAL TRIGGERS: "Do you drink caffeinated beverages (e.g., coffee, colas, teas), and how much daily?" "Do you drink alcohol or use any drugs?" "Have you started any new medicines recently?"       no 11. PATIENT SUPPORT: "Who is with you now?" "Who do you live with?" "Do you have family or friends who you can talk to?"        Yes - daughter 75. OTHER SYMPTOMS: "Do you have any other symptoms?" (e.g., feeling depressed, trouble concentrating, trouble sleeping, trouble breathing, palpitations or fast heartbeat, chest pain, sweating, nausea, or diarrhea)       no 13. PREGNANCY: "Is there any chance you are pregnant?" "When was your last menstrual period?"  Protocols used: Anxiety and Panic Attack-A-AH

## 2022-08-27 NOTE — Progress Notes (Signed)
Not seen - seeing Dr. Ky Barban due to split visit

## 2022-08-30 ENCOUNTER — Encounter: Payer: Self-pay | Admitting: Family Medicine

## 2022-08-30 ENCOUNTER — Ambulatory Visit (INDEPENDENT_AMBULATORY_CARE_PROVIDER_SITE_OTHER): Payer: Medicare HMO | Admitting: Family Medicine

## 2022-08-30 ENCOUNTER — Other Ambulatory Visit: Payer: Self-pay

## 2022-08-30 VITALS — BP 134/76 | HR 93 | Temp 98.2°F | Resp 18 | Ht 64.0 in | Wt 181.7 lb

## 2022-08-30 DIAGNOSIS — F411 Generalized anxiety disorder: Secondary | ICD-10-CM | POA: Diagnosis not present

## 2022-08-30 DIAGNOSIS — Z0000 Encounter for general adult medical examination without abnormal findings: Secondary | ICD-10-CM | POA: Diagnosis not present

## 2022-08-30 DIAGNOSIS — R69 Illness, unspecified: Secondary | ICD-10-CM | POA: Diagnosis not present

## 2022-08-30 DIAGNOSIS — H3530 Unspecified macular degeneration: Secondary | ICD-10-CM

## 2022-08-30 DIAGNOSIS — F339 Major depressive disorder, recurrent, unspecified: Secondary | ICD-10-CM | POA: Diagnosis not present

## 2022-08-30 MED ORDER — BUSPIRONE HCL 10 MG PO TABS: 10.0000 mg | ORAL_TABLET | Freq: Three times a day (TID) | ORAL | 0 refills | Status: DC

## 2022-08-30 NOTE — Assessment & Plan Note (Signed)
Worsened per scoring and symptomatology. Will trail increase in buspar to '10mg'$  TID. Given multiple medications tried and previous intolerance, recommend psychiatry evaluation. Would also benefit from counseling. Will place external referral given lives in [place]. F/u in 1 month if unable to place with psychiatry, otherwise f/u with PCP as scheduled in 3 mnths.

## 2022-08-30 NOTE — Patient Instructions (Signed)
It was great to see you!  Our plans for today:  - Obtain your updated pneumonia shot at the pharmacy or the clinic. - Bring a copy of your advanced directives to the clinic so we can scan into your chart.  Take care and seek immediate care sooner if you develop any concerns.   Dr. Ky Barban  Things to do to keep yourself healthy  - Exercise at least 30-45 minutes a day, 3-4 days a week.  - Eat a low-fat diet with lots of fruits and vegetables, up to 7-9 servings per day.  - Seatbelts can save your life. Wear them always.  - Smoke detectors on every level of your home, check batteries every year.  - Eye Doctor - have an eye exam every 1-2 years  - Safe sex - if you may be exposed to STDs, use a condom.  - Alcohol -  If you drink, do it moderately, less than 2 drinks per day.  - [place]. Choose someone to speak for you if you are not able. https://www.prepareforyourcare.org is a great website to help you navigate this. - Depression is common in our stressful world.If you're feeling down or losing interest in things you normally enjoy, please come in for a visit.  - Violence - If anyone is threatening or hurting you, please call immediately.

## 2022-08-30 NOTE — Progress Notes (Addendum)
Annual Wellness Visit  Patient: Judy Allen, Female    DOB: [birth year], 67 y.o.   MRN: [id number]  Subjective  Chief Complaint  Patient presents with   Medicare Wellness   CLARABEL MARION is a 67 y.o. female who presents today for her Annual Wellness Visit. She reports consuming a general diet. The patient does not participate in regular exercise at present. She generally feels well. She reports sleeping well. She does have additional problems to discuss today (see below).   Anxiety, Depression - Medications: buspar (increased to TID from BID at last visit), effexor. Previously tried Abilify, Dietitian but caused extrapyrimadal symptoms, made tremors worse. - Taking: good compliance. Doesn't feel buspar is helping much. - Counseling: not currently. Previously seen by [facility], decided not to go back.  - Previous hospitalizations: none - Symptoms: fidgety/restless, sleeping too much - Current stressors: none - Coping Mechanisms: none     08/30/2022   11:21 AM 08/30/2022    7:56 AM 08/03/2022    2:42 PM 06/10/2022   10:14 AM  GAD 7 : Generalized Anxiety Score  Nervous, Anxious, on Edge 3 0 3 2  Control/stop worrying 3 0 1 2  Worry too much - different things 3 0 1 3  Trouble relaxing 3 0 2 2  Restless 3 0 1 2  Easily annoyed or irritable 3 0 1 2  Afraid - awful might happen 3 0 0 0  Total GAD 7 Score 21 0 9 13  Anxiety Difficulty Very difficult Not difficult at all Not difficult at all       08/30/2022   11:21 AM 08/30/2022    7:56 AM 08/03/2022    2:41 PM  Depression screen PHQ 2/9  Decreased Interest 1 0 0  Down, Depressed, Hopeless 1 0 0  PHQ - 2 Score 2 0 0  Altered sleeping 3 0 0  Tired, decreased energy '3 1 1  '$ Change in appetite 0 0 1  Feeling bad or failure about yourself  0 0 0  Trouble concentrating 0 0 0  Moving slowly or fidgety/restless 0 0 2  Suicidal thoughts 0 0 0  PHQ-9 Score '8 1 4  '$ Difficult doing work/chores Somewhat difficult Not difficult at all Not  difficult at all    Vision:Within last year and Dental: No current dental problems and Receives regular dental care   Patient Active Problem List   Diagnosis Date Noted   Macular degeneration 08/30/2022   Genetic testing 08/20/2022   Breast cancer ([place]) 07/26/2022   Breast cancer, left ([place]) 07/10/2022   Invasive carcinoma of breast ([place]) 07/10/2022   Goals of care, counseling/discussion 07/10/2022   Cystocele with rectocele 12/28/2021   S/P vaginal hysterectomy 12/28/2021   Bilateral hip pain 11/03/2021   Hypotension due to drugs 05/03/2021   Disorder of SI (sacroiliac) joint 11/11/2020   Arthritis of sacroiliac joint of both sides 11/11/2020   Chronic radicular lumbar pain 11/11/2020   Hyperparathyroidism ([place]) 08/25/2019   Diabetes mellitus type 2 in obese ([place]) 11/06/2018   Cervical radiculopathy (left) 08/31/2018   Osteoarthritis of spine with radiculopathy, cervical region 08/31/2018   History of anal cancer 05/30/2018   Myofascial pain syndrome, cervical 05/16/2018   Chronic pain syndrome 05/16/2018   Cervical facet joint syndrome 05/16/2018   Perihepatitis ([place]) 09/27/2017   Fatty liver 12/22/2016   Chronic respiratory failure with hypoxia, on home oxygen therapy ([place]) 07/28/2016   Centrilobular emphysema ([place]) 07/14/2016   Abnormal Pap smear of cervix  04/14/2016   Anal squamous cell carcinoma ([place]) 03/09/2016   Non-thrombocytopenic purpura ([facility]) 01/12/2016   Chronic constipation 10/16/2015   History of fusion of cervical spine 04/02/2015   Benign hypertension 01/16/2015   Chronic cervical pain 01/16/2015   CN (constipation) 01/16/2015   Gastric reflux 01/16/2015   Hypertriglyceridemia 01/16/2015   Edema leg 01/16/2015   Chronic recurrent major depressive disorder ([place]) 37/90/2409   Dysmetabolic syndrome 73/53/2992   Obstructive apnea 01/16/2015   Psoriasis 01/16/2015   Allergic rhinitis 01/16/2015   Bursitis, trochanteric 01/16/2015   GAD (generalized  anxiety disorder) 01/16/2015   Tachycardia, paroxysmal ([place])    Past Surgical History:  Procedure Laterality Date   ANTERIOR AND POSTERIOR REPAIR N/A 12/28/2021   Procedure: ANTERIOR (CYSTOCELE) AND POSTERIOR REPAIR (RECTOCELE);  Surgeon: Rubie Maid, MD;  Location: [facility];  Service: Gynecology;  Laterality: N/A;   ANTERIOR CERVICAL DECOMP/DISCECTOMY FUSION N/A 2001   PROCEDURE: ACDF C4-C7   ANTERIOR FUSION LUMBAR SPINE  1990   plate in back, not metal   BREAST BIOPSY Left 07/05/2022   [place] Bx, Ribbon Clip, Path Pending   BREAST BIOPSY Left 07/05/2022   [place] LT BREAST BX W LOC DEV 1ST LESION IMG BX SPEC US GUIDE 07/05/2022 [facility]   BREAST BIOPSY Left 07/22/2022   [place] LT RADIO FREQUENCY TAG LOC US GUIDE 07/22/2022 [facility]   CARDIAC CATHETERIZATION  2009?    [facility]   CARDIAC CATHETERIZATION  05/2012   [facility]: Mild hypertension 37/12 with a mean pressure of 21 mm mercury   CHOLECYSTECTOMY N/A 09/12/2017   Procedure: LAPAROSCOPIC CHOLECYSTECTOMY WITH INTRAOPERATIVE CHOLANGIOGRAM;  Surgeon: Robert Bellow, MD;  Location: [facility];  Service: General;  Laterality: N/A;   COLONOSCOPY  2018   LIVER BIOPSY N/A 09/12/2017   Procedure: LIVER BIOPSY;  Surgeon: Robert Bellow, MD;  Location: [facility];  Service: General;  Laterality: N/A;   MASS EXCISION Left 02/23/2016   Procedure: EXCISION MASS;  Surgeon: Christene Lye, MD;  Location: [facility];  Service: General;  Laterality: Left;   PART [place] BIOPSY Left 07/26/2022   Procedure: PART [place] NODE BIOPSY;  Surgeon: Herbert Pun, MD;  Location: [facility];  Service: General;  Laterality: Left;   PORT-A-CATH REMOVAL  2018   PORTACATH PLACEMENT Left 03/12/2016   Procedure: INSERTION PORT-A-CATH;  Surgeon: Christene Lye, MD;  Location: [facility];  Service: General;  Laterality: Left;   TONSILLECTOMY  Bilateral [place] Right 09/2018   Dr. Sabra Heck    TUBAL LIGATION     s/p   VAGINAL HYSTERECTOMY N/A 12/28/2021   Procedure: HYSTERECTOMY VAGINAL;  Surgeon: Rubie Maid, MD;  Location: [facility];  Service: Gynecology;  Laterality: N/A;   Social History   Tobacco Use   Smoking status: Former    Packs/day: 1.00    Years: 32.00    Total pack years: 32.00    Types: Cigarettes    Start date: 08/10/1963    Quit date: 01/15/2006    Years since quitting: 16.6   Smokeless tobacco: Never  Vaping Use   Vaping Use: Never used  Substance Use Topics   Alcohol use: No   Drug use: No      Medications: Outpatient Medications Prior to Visit  Medication Sig   albuterol (PROVENTIL HFA;VENTOLIN HFA) 108 (90 Base) MCG/ACT inhaler Inhale 2 puffs into the lungs every 6 (six) hours as needed for wheezing or shortness of breath.   anastrozole (ARIMIDEX)  1 MG tablet Take 1 tablet (1 mg total) by mouth daily.   Buprenorphine HCl (BELBUCA) 450 MCG FILM Place 1 Film (450 mcg total) inside cheek every 12 (twelve) hours.   Cholecalciferol (VITAMIN D3) 1000 units CAPS Take 1 capsule by mouth daily.   EFFEXOR XR 75 MG 24 hr capsule TAKE 3 CAPSULES DAILY WITH BREAKFAST   gabapentin (NEURONTIN) 400 MG capsule Take 1 capsule (400 mg total) by mouth 3 (three) times daily.   metoprolol succinate (TOPROL-XL) 50 MG 24 hr tablet Take 50 mg by mouth in the morning.   Multiple Vitamins-Minerals (EYE VITAMINS & MINERALS PO) Take 1 capsule by mouth in the morning. [employer] Eye Vitamins   NIFEdipine (PROCARDIA-XL/NIFEDICAL-XL) 30 MG 24 hr tablet Take 30 mg by mouth at bedtime.   omega-3 acid ethyl esters (LOVAZA) 1 g capsule TAKE 2 CAPSULES BY MOUTH TWO TIMES A DAY   omeprazole (PRILOSEC) 40 MG capsule TAKE 1 CAPSULE DAILY   OXYGEN Inhale 2-4 L into the lungs continuous. 2 L qd and 4 L qhs   Potassium Gluconate 80 MG TABS Take 1 tablet by mouth daily.   tiZANidine (ZANAFLEX) 4 MG tablet Take 1 tablet  (4 mg total) by mouth every 8 (eight) hours as needed.   torsemide (DEMADEX) 5 MG tablet Take 5 mg by mouth every morning.   umeclidinium-vilanterol (ANORO ELLIPTA) 62.5-25 MCG/ACT AEPB Inhale 1 puff into the lungs in the morning.   [DISCONTINUED] busPIRone (BUSPAR) 7.5 MG tablet Take 1 tablet (7.5 mg total) by mouth 3 (three) times daily.   No facility-administered medications prior to visit.    Allergies  Allergen Reactions   Aczone [Dapsone] Other (See Comments)    Hypoxemia   Atarax [Hydroxyzine]     Hypoxemia    Hydrochlorothiazide     History of hyperparathyroidism    Sulfa Antibiotics Rash   Sulfasalazine Rash    Patient Care Team: Steele Sizer, MD as PCP - General (Family Medicine) Solum, Betsey Holiday, MD as Physician Assistant (Endocrinology) Jimmye Norman, MD (Inactive) as Resident (Pulmonary Disease) Franciso Bend, MD (Cardiology) Daiva Huge, RN as Oncology Nurse Navigator Earlie Server, MD as Consulting Physician (Oncology) Theressa Stamps, MD (Gastroenterology) Gillis Santa, MD as Consulting Physician (Pain Medicine)      Objective  BP 134/76   Pulse 93   Temp 98.2 F (36.8 C)   Resp 18   Ht '5\' 4"'$  (1.626 m)   Wt 181 lb 11.2 oz (82.4 kg)   LMP 03/04/2002 (Approximate)   SpO2 92%   BMI 31.19 kg/m  BP Readings from Last 3 Encounters:  08/30/22 134/76  08/30/22 134/76  08/13/22 133/77   Wt Readings from Last 3 Encounters:  08/30/22 181 lb 11.2 oz (82.4 kg)  08/30/22 181 lb 11.2 oz (82.4 kg)  08/13/22 184 lb 12.8 oz (83.8 kg)   Physical Exam Well appearing, in NAD. Speaks in full sentences. Comfortable WOB on RA. No resp distress.     Most recent functional status assessment:    08/30/2022   11:21 AM  In your present state of health, do you have any difficulty performing the following activities:  Hearing? 0  Vision? 0  Difficulty concentrating or making decisions? 0  Walking or climbing stairs? 0  Dressing or bathing? 0  Doing  errands, shopping? 0   Most recent fall risk assessment:    08/30/2022   11:21 AM  Fall Risk   Falls in the past year? 0  Risk for  fall due to : No Fall Risks  Follow up Falls prevention discussed;Education provided;Falls evaluation completed    Most recent depression screenings:    08/30/2022   11:21 AM 08/30/2022    7:56 AM  PHQ 2/9 Scores  PHQ - 2 Score 2 0  PHQ- 9 Score 8 1   Most recent cognitive screening:    08/30/2022    7:55 AM  6CIT Screen  What Year? 0 points  What month? 0 points  What time? 0 points  Count back from 20 0 points  Months in reverse 0 points  Repeat phrase 0 points  Total Score 0 points   Most recent Audit-C alcohol use screening    08/30/2022   11:20 AM  Alcohol Use Disorder Test (AUDIT)  1. How often do you have a drink containing alcohol? 0   A score of 3 or more in women, and 4 or more in men indicates increased risk for alcohol abuse, EXCEPT if all of the points are from question 1   Vision/Hearing Screen: No results found.  Last CBC Lab Results  Component Value Date   WBC 8.8 08/03/2022   HGB 11.6 (L) 08/03/2022   HCT 34.1 (L) 08/03/2022   MCV 89.0 08/03/2022   MCH 30.3 08/03/2022   RDW 13.7 08/03/2022   PLT 281 53/97/6734   Last metabolic panel Lab Results  Component Value Date   GLUCOSE 97 08/03/2022   NA 143 08/03/2022   K 4.4 08/03/2022   CL 103 08/03/2022   CO2 31 08/03/2022   BUN 11 08/03/2022   CREATININE 0.79 08/03/2022   EGFR 82 08/03/2022   CALCIUM 9.4 08/03/2022   PROT 7.1 08/03/2022   ALBUMIN 4.1 07/09/2022   LABGLOB 2.5 09/30/2015   AGRATIO 1.8 09/30/2015   BILITOT 0.4 08/03/2022   ALKPHOS 95 07/09/2022   AST 46 (H) 08/03/2022   ALT 47 (H) 08/03/2022   ANIONGAP 5 07/27/2022   Last lipids Lab Results  Component Value Date   CHOL 191 08/03/2022   HDL 35 (L) 08/03/2022   LDLCALC 103 (H) 08/03/2022   TRIG 397 (H) 08/03/2022   CHOLHDL 5.5 (H) 08/03/2022   Last hemoglobin A1c Lab Results   Component Value Date   HGBA1C 6.3 (A) 08/03/2022      No results found for any visits on 08/30/22.    Assessment & Plan   Annual wellness visit done today including the all of the following: Reviewed patient's Family Medical History Reviewed and updated list of patient's medical providers Assessment of cognitive impairment was done Assessed patient's functional ability Established a written schedule for health screening [place] Completed and Reviewed  Exercise Activities and Dietary recommendations  Goals      Weight (lb) < 175 lb (79.4 kg)     Patient would like to lose weight with healthy eating over the next year.         Immunization History  Administered Date(s) Administered   Covid-19, Mrna,Vaccine(Spikevax)31yr and older 06/02/2022   Influenza, High Dose Seasonal PF 05/22/2021, 05/08/2022   Influenza, Seasonal, Injecte, Preservative Fre 05/09/2012   Influenza,inj,Quad PF,6+ Mos 05/20/2014, 05/20/2016, 04/27/2017, 04/27/2018, 04/23/2019, 05/16/2020   Influenza-Unspecified 05/20/2014, 05/20/2016, 04/27/2017, 06/04/2017, 04/27/2018, 04/13/2019   Moderna Sars-Covid-2 Vaccination 10/31/2019, 11/28/2019, 06/16/2020, 05/22/2021   Pneumococcal Conjugate-13 06/19/2020   Pneumococcal Polysaccharide-23 03/10/2016   Respiratory Syncytial Virus Vaccine,Recomb Aduvanted(Arexvy) 05/08/2022   Td 03/03/2009   Tdap 03/03/2009, 04/23/2019   Unspecified SARS-COV-2 Vaccination 06/02/2022   Zoster Recombinat (Shingrix)  05/08/2022, 08/03/2022    Health Maintenance  Topic Date Due   COVID-19 Vaccine (6 - 2023-24 season) 09/15/2022 (Originally 07/28/2022)   Pneumonia Vaccine 17+ Years old (3 - PPSV23 or PCV20) 05/20/2023 (Originally 06/19/2021)   HEMOGLOBIN A1C  02/02/2023   OPHTHALMOLOGY EXAM  04/10/2023   Diabetic kidney evaluation - Urine ACR  05/15/2023   MAMMOGRAM  05/27/2023   Diabetic kidney evaluation - eGFR measurement  08/04/2023   FOOT EXAM   08/04/2023   Medicare Annual Wellness (AWV)  08/31/2023   COLONOSCOPY (Pts 45-89yr Insurance coverage will need to be confirmed)  03/17/2026   DTaP/Tdap/Td (4 - Td or Tdap) 04/22/2029   INFLUENZA VACCINE  Completed   DEXA SCAN  Completed   Hepatitis C Screening  Completed   Zoster Vaccines- Shingrix  Completed   HPV VACCINES  Aged Out   Advanced Care Planning: A voluntary discussion about advance care planning including the explanation and discussion of advance directives.  Discussed health care proxy and Living will, and the patient was able to identify a health care proxy as daughter, AGaspar Bidding  Patient does have a living will at present time. If patient does have living will, I have requested they bring this to the clinic to be scanned in to their chart.   Discussed health benefits of physical activity, and encouraged her to engage in regular exercise appropriate for her age and condition.    Problem List Items Addressed This Visit       Other   Chronic recurrent major depressive disorder ([place]    Worsened per scoring and symptomatology. Will trail increase in buspar to '10mg'$  TID. Given multiple medications tried and previous intolerance, recommend psychiatry evaluation. Would also benefit from counseling. [place] place [place] 1 month if unable to place with psychiatry, otherwise f/u with PCP as scheduled in 3 mnths.      Relevant Medications   busPIRone (BUSPAR) 10 MG tablet   Other Relevant Orders   Ambulatory referral to Psychiatry   GAD (generalized anxiety disorder)    Worsened per scoring and symptomatology. Will trail increase in buspar to '10mg'$  TID. Given multiple medications tried and previous intolerance, recommend psychiatry evaluation. Would also benefit from counseling. [place] place [place] 1 month if unable to place with psychiatry, otherwise f/u with PCP as scheduled in 3 mnths.       Relevant Medications   busPIRone (BUSPAR) 10 MG tablet   Other Relevant Orders   Ambulatory referral to Psychiatry   Macular degeneration   Other Visit Diagnoses     Encounter for Medicare annual wellness exam    -  Primary       Return in about 1 year (around 08/31/2023) for awv.     AMyles Gip DO

## 2022-08-30 NOTE — Addendum Note (Signed)
Addended by: Myles Gip on: 08/30/2022 12:15 PM   Modules accepted: Orders, Level of Service

## 2022-08-30 NOTE — Addendum Note (Signed)
Addended by: Myles Gip on: 08/30/2022 11:53 AM   Modules accepted: Level of Service

## 2022-09-03 ENCOUNTER — Ambulatory Visit: Payer: Medicare HMO | Admitting: Family Medicine

## 2022-09-09 ENCOUNTER — Encounter: Payer: Medicare HMO | Admitting: Student in an Organized Health Care Education/Training Program

## 2022-09-09 DIAGNOSIS — C211 Malignant neoplasm of anal canal: Secondary | ICD-10-CM | POA: Diagnosis not present

## 2022-09-09 DIAGNOSIS — G4733 Obstructive sleep apnea (adult) (pediatric): Secondary | ICD-10-CM | POA: Diagnosis not present

## 2022-09-09 DIAGNOSIS — R0902 Hypoxemia: Secondary | ICD-10-CM | POA: Diagnosis not present

## 2022-09-09 DIAGNOSIS — J432 Centrilobular emphysema: Secondary | ICD-10-CM | POA: Diagnosis not present

## 2022-09-09 DIAGNOSIS — I270 Primary pulmonary hypertension: Secondary | ICD-10-CM | POA: Diagnosis not present

## 2022-09-10 DIAGNOSIS — E119 Type 2 diabetes mellitus without complications: Secondary | ICD-10-CM | POA: Diagnosis not present

## 2022-09-10 DIAGNOSIS — I10 Essential (primary) hypertension: Secondary | ICD-10-CM | POA: Diagnosis not present

## 2022-09-10 DIAGNOSIS — H903 Sensorineural hearing loss, bilateral: Secondary | ICD-10-CM | POA: Diagnosis not present

## 2022-09-10 DIAGNOSIS — Z882 Allergy status to sulfonamides status: Secondary | ICD-10-CM | POA: Diagnosis not present

## 2022-09-10 DIAGNOSIS — H6121 Impacted cerumen, right ear: Secondary | ICD-10-CM | POA: Diagnosis not present

## 2022-09-10 DIAGNOSIS — Z91048 Other nonmedicinal substance allergy status: Secondary | ICD-10-CM | POA: Diagnosis not present

## 2022-09-10 DIAGNOSIS — H9202 Otalgia, left ear: Secondary | ICD-10-CM | POA: Diagnosis not present

## 2022-09-10 DIAGNOSIS — Z888 Allergy status to other drugs, medicaments and biological substances status: Secondary | ICD-10-CM | POA: Diagnosis not present

## 2022-09-10 DIAGNOSIS — Z87891 Personal history of nicotine dependence: Secondary | ICD-10-CM | POA: Diagnosis not present

## 2022-09-10 DIAGNOSIS — R42 Dizziness and giddiness: Secondary | ICD-10-CM | POA: Diagnosis not present

## 2022-09-13 ENCOUNTER — Encounter: Payer: Self-pay | Admitting: Student in an Organized Health Care Education/Training Program

## 2022-09-13 ENCOUNTER — Ambulatory Visit
Payer: Medicare HMO | Attending: Student in an Organized Health Care Education/Training Program | Admitting: Student in an Organized Health Care Education/Training Program

## 2022-09-13 VITALS — BP 135/76 | HR 88 | Temp 97.2°F | Resp 16 | Ht 64.0 in | Wt 185.0 lb

## 2022-09-13 DIAGNOSIS — G894 Chronic pain syndrome: Secondary | ICD-10-CM | POA: Insufficient documentation

## 2022-09-13 DIAGNOSIS — M542 Cervicalgia: Secondary | ICD-10-CM | POA: Diagnosis not present

## 2022-09-13 DIAGNOSIS — G8929 Other chronic pain: Secondary | ICD-10-CM | POA: Insufficient documentation

## 2022-09-13 DIAGNOSIS — M5416 Radiculopathy, lumbar region: Secondary | ICD-10-CM | POA: Insufficient documentation

## 2022-09-13 DIAGNOSIS — M5412 Radiculopathy, cervical region: Secondary | ICD-10-CM | POA: Insufficient documentation

## 2022-09-13 MED ORDER — GABAPENTIN 400 MG PO CAPS: 400.0000 mg | ORAL_CAPSULE | Freq: Three times a day (TID) | ORAL | 2 refills | Status: DC

## 2022-09-13 MED ORDER — BELBUCA 600 MCG BU FILM: 1.0000 | ORAL_FILM | Freq: Two times a day (BID) | BUCCAL | 2 refills | Status: DC

## 2022-09-13 MED ORDER — HYDROCODONE-ACETAMINOPHEN 5-325 MG PO TABS: 1.0000 | ORAL_TABLET | Freq: Every day | ORAL | 0 refills | Status: AC | PRN

## 2022-09-13 NOTE — Progress Notes (Signed)
PROVIDER NOTE: Information contained herein reflects review and annotations entered in association with encounter. Interpretation of such information and data should be left to medically-trained personnel. Information provided to patient can be located elsewhere in the medical record under "Patient Instructions". Document created using STT-dictation technology, any transcriptional errors that may result from process are unintentional.    Patient: Judy Allen  Service Category: E/M  Provider: Gillis Santa, MD  DOB: 27-Mar-1956  DOS: 09/13/2022  Referring Provider: Steele Sizer, MD  MRN: [id number]  Specialty: Interventional Pain Management  PCP: Steele Sizer, MD  Type: Established Patient  Setting: Ambulatory outpatient    Location: Office  Delivery: Face-to-face     HPI  [name], a 67 y.o. year old female, is here today because of her Chronic pain syndrome [G89.4]. [name] primary complain today is Neck Pain Last encounter: My last encounter with her was on 06/15/2022 Pertinent problems: Judy Allen has Chronic recurrent major depressive disorder ([place]); GAD (generalized anxiety disorder); Myofascial pain syndrome, cervical; Chronic pain syndrome; Cervical facet joint syndrome; Cervical radiculopathy (left); and Osteoarthritis of spine with radiculopathy, cervical region on their pertinent problem list. Pain Assessment: Severity of Chronic pain is reported as a 5 /10. Location: Neck Right, Left/radiates down left arm to fingertips sometimes. Onset: More than a month ago. Quality: Burning, Sharp. Timing: Intermittent. Modifying factor(s): medication. Vitals:  height is '5\' 4"'$  (1.626 m) and weight is 185 lb (83.9 kg). Her temperature is 97.2 F (36.2 C) (abnormal). Her blood pressure is 135/76 and her pulse is 88. Her respiration is 16 and oxygen saturation is 92%.   Reason for encounter: medication management.  Judy Allen follows up today for medication management.  She is having more  difficulty managing her pain and states that the belbuca was effective at first but is not providing as much analgesic benefit at this time.  She is currently on 450 mcg twice daily.  We discussed dose titration to 600 mcg twice daily.  She is still provided hydrocodone once daily for breakthrough pain.  Given that we are increasing her belbuca dose, I recommend that she decrease her hydrocodone from 7.5 to 5 mg daily as needed for breakthrough pain.  Pharmacotherapy Assessment  Analgesic: Increase belbuca to 600 mcg twice daily.  hydrocodone 5 mg  daily as needed for breakthrough pain    Monitoring: [place] PMP: PDMP reviewed during this encounter.       Pharmacotherapy: No side-effects or adverse reactions reported. Compliance: No problems identified. Effectiveness: Clinically acceptable.  Dewayne Shorter, RN  09/13/2022  1:11 PM  Sign when Signing Visit Nursing Pain Medication Assessment:  Safety precautions to be maintained throughout the outpatient stay will include: orient to surroundings, keep bed in low position, maintain call bell within reach at all times, provide assistance with transfer out of bed and ambulation.  Medication Inspection Compliance: Pill count conducted under aseptic conditions, in front of the patient. Neither the pills nor the bottle was removed from the patient's sight at any time. Once count was completed pills were immediately returned to the patient in their original bottle.  Medication: Hydrocodone/APAP Pill/Patch Count:  0 of 90 pills remain Pill/Patch Appearance: Markings consistent with prescribed medication Bottle Appearance: Standard pharmacy container. Clearly labeled. Filled Date: 34 / 20 / 2023 Last Medication intake:  Ran out of medicine more than 48 hours ago  Belbuca 7/60 Filled 08/06/22     No results found for: "CBDTHCR" No results found for: "D8THCCBX" No results  found for: "D9THCCBX"  UDS:  Summary  Date Value Ref Range Status  11/03/2021 Note   Final    Comment:    ==================================================================== [employer] Select 13 (MW) ==================================================================== Test                             Result       Flag       Units  Drug Present and Declared for Prescription Verification   Tramadol                       >6098        EXPECTED   ng/mg creat   O-Desmethyltramadol            >6098        EXPECTED   ng/mg creat   N-Desmethyltramadol            3405         EXPECTED   ng/mg creat    Source of tramadol is a prescription medication. O-desmethyltramadol    and N-desmethyltramadol are expected metabolites of tramadol.  ==================================================================== Test                      Result    Flag   Units      Ref Range   Creatinine              82               mg/dL      >=20 ==================================================================== Declared Medications:  The flagging and interpretation on this report are based on the  following declared medications.  Unexpected results may arise from  inaccuracies in the declared medications.   **Note: The testing scope of this panel includes these medications:   Tramadol (Ultram)   **Note: The testing scope of this panel does not include the  following reported medications:   Albuterol (Ventolin HFA)  Fish Oil  Gabapentin (Neurontin)  Gemfibrozil (Lopid)  Hydrocortisone  Influenza Virus Vaccine (Fluzone)  Metoprolol (Toprol)  Omeprazole (Prilosec)  Telmisartan (Micardis)  Tizanidine (Zanaflex)  Torsemide (Demadex)  Umeclidinium (Anoro)  Venlafaxine (Effexor)  Vilanterol (Anoro)  Vitamin D2 (Drisdol) ==================================================================== For clinical consultation, please call [phone]. ====================================================================       ROS  Constitutional: Denies any fever or chills Gastrointestinal: No  reported hemesis, hematochezia, vomiting, or acute GI distress Musculoskeletal:  Cervicalgia Neurological: No reported episodes of acute onset apraxia, aphasia, dysarthria, agnosia, amnesia, paralysis, loss of coordination, or loss of consciousness  Medication Review  Buprenorphine HCl, HYDROcodone-acetaminophen, Multiple Vitamins-Minerals, NIFEdipine, Oxygen-Helium, Potassium Gluconate, Vitamin D3, albuterol, anastrozole, busPIRone, gabapentin, metoprolol succinate, omega-3 acid ethyl esters, omeprazole, tiZANidine, torsemide, umeclidinium-vilanterol, and venlafaxine XR  History Review  Allergy: [name] is allergic to aczone [dapsone], atarax [hydroxyzine], hydrochlorothiazide, sulfa antibiotics, and sulfasalazine. Drug: [name]  reports no history of drug use. Alcohol:  reports no history of alcohol use. Tobacco:  reports that she quit smoking about 16 years ago. Her smoking use included cigarettes. She started smoking about 59 years ago. She has a 32.00 pack-year smoking history. She has never used smokeless tobacco. Social: Judy Allen  reports that she quit smoking about 16 years ago. Her smoking use included cigarettes. She started smoking about 59 years ago. She has a 32.00 pack-year smoking history. She has never used smokeless tobacco. She reports that she does not drink alcohol and does not use  drugs. Medical:  has a past medical history of Anal squamous cell carcinoma ([place]) (03/10/2016), Anemia, Anxiety, Aortic atherosclerosis ([place]), Arthritis, Breast cancer, left ([place]) (95/04/3266), Complication of anesthesia, Constipation, COPD (chronic obstructive pulmonary disease) ([place]), Coronary artery calcification seen on CT scan, Depression, Diastolic dysfunction (12/45/8099), Emphysema of lung ([place]), GERD (gastroesophageal reflux disease), Goals of care, counseling/discussion (07/10/2022), H/O allergic rhinitis, Heart murmur, Hemorrhoid, History of fusion of cervical spine, History of kidney  stones, Hyperlipidemia, Hyperparathyroidism ([place]), Hypertension, Macular degeneration, 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 Surgical: Judy Allen  has a past surgical history that includes Tubal ligation; Anterior fusion lumbar spine (1990); Mass excision (Left, 02/23/2016); Colonoscopy (2018); Portacath placement (Left, 03/12/2016); Cardiac catheterization (2009? ); Cardiac catheterization (05/2012); Port-a-cath removal (2018); Liver biopsy (N/A, 09/12/2017); Cholecystectomy (N/A, 09/12/2017); Trigger finger release (Right, 09/2018); Anterior cervical decomp/discectomy fusion (N/A, 2001); Tonsillectomy (Bilateral, 1959); Anterior and posterior repair (N/A, 12/28/2021); Vaginal hysterectomy (N/A, 12/28/2021); Breast biopsy (Left, 07/05/2022); Breast biopsy (Left, 07/05/2022); Breast biopsy (Left, 07/22/2022); and Part mastectomy,radio frequency localizer,axillary sentinel node biopsy (Left, 07/26/2022). Family: family history includes Asthma in her mother; Breast cancer in her cousin and maternal aunt; Cancer in her brother; Heart attack in her mother; Liver cancer (age of onset: 30) in her father.  Laboratory Chemistry Profile   Renal Lab Results  Component Value Date   BUN 11 08/03/2022   CREATININE 0.79 08/03/2022   LABCREA 40 05/14/2022   BCR SEE NOTE: 08/03/2022   GFRAA 75 04/23/2019   GFRNONAA >60 07/27/2022    Hepatic Lab Results  Component Value Date   AST 46 (H) 08/03/2022   ALT 47 (H) 08/03/2022   ALBUMIN 4.1 07/09/2022   ALKPHOS 95 07/09/2022   LIPASE 27 08/03/2017    Electrolytes Lab Results   Component Value Date   NA 143 08/03/2022   K 4.4 08/03/2022   CL 103 08/03/2022   CALCIUM 9.4 08/03/2022    Bone Lab Results  Component Value Date   VD25OH 57 07/22/2021    Inflammation (CRP: Acute Phase) (ESR: Chronic Phase) No results found for: "CRP", "ESRSEDRATE", "LATICACIDVEN"       Note: Above Lab results reviewed.  Recent Imaging Review  MM Breast Surgical Specimen CLINICAL DATA:  Specimen radiograph status post left breast lumpectomy.  EXAM: SPECIMEN RADIOGRAPH OF THE LEFT BREAST  COMPARISON:  Previous exam(s).  FINDINGS: Status post excision of the left breast. The radiofrequency tag and ribbon shaped clip are present within the specimen. No OR callback was requested.  IMPRESSION: Specimen radiograph of the left breast.  Electronically Signed   By: Ammie Ferrier M.D.   On: 07/26/2022 15:49 NM Sentinel Node Inj-No Rpt (Breast) Sulfur Colloid was injected by the Nuclear Medicine Technologist for  sentinel lymph node localization.  Note: Reviewed        Physical Exam  General appearance: Well nourished, well developed, and well hydrated. In no apparent acute distress Mental status: Alert, oriented x 3 (person, place, & time)       Respiratory: No evidence of acute respiratory distress on home oxygen 2 L Eyes: PERLA Vitals: BP 135/76   Pulse 88   Temp (!) 97.2 F (36.2 C)   Resp 16   Ht '5\' 4"'$  (1.626 m)   Wt  185 lb (83.9 kg)   LMP 03/04/2002 (Approximate)   SpO2 92% Comment: o2'@2L'$  [place]  BMI 31.76 kg/m  BMI: Estimated body mass index is 31.76 kg/m as calculated from the following:   Height as of this encounter: '5\' 4"'$  (1.626 m).   Weight as of this encounter: 185 lb (83.9 kg). Ideal: Ideal body weight: 54.7 kg (120 lb 9.5 oz) Adjusted ideal body weight: 66.4 kg (146 lb 5.7 oz)  Cervicalgia with pain radiation into bilateral upper extremity  5 out of 5 strength bilateral lower extremity: Plantar flexion, dorsiflexion, knee flexion, knee  extension.   Assessment   Diagnosis Status  1. Chronic pain syndrome   2. Cervical radiculopathy (left)   3. Lumbar radiculopathy   4. Chronic cervical pain   5. Chronic radicular lumbar pain     Increased pain Increased pain Persistent     Plan of Care  Problem-specific:  No problem-specific Assessment & Plan notes found for this encounter.  Judy Allen has a current medication list which includes the following long-term medication(s): effexor xr, omega-3 acid ethyl esters, omeprazole, potassium gluconate, and gabapentin.  Pharmacotherapy (Medications Ordered): Meds ordered this encounter  Medications   Buprenorphine HCl (BELBUCA) 600 MCG FILM    Sig: Place 1 Film inside cheek every 12 (twelve) hours.    Dispense:  60 each    Refill:  2   gabapentin (NEURONTIN) 400 MG capsule    Sig: Take 1 capsule (400 mg total) by mouth 3 (three) times daily.    Dispense:  90 capsule    Refill:  2   HYDROcodone-acetaminophen (NORCO/VICODIN) 5-325 MG tablet    Sig: Take 1 tablet by mouth daily as needed for severe pain. Must last 30 days.    Dispense:  30 tablet    Refill:  0    Chronic Pain: STOP Act (Not applicable) Fill 1 day early if closed on refill date. Avoid benzodiazepines within 8 hours of opioids    Follow-up plan:   Return in about 3 months (around 12/12/2022) for Medication Management, in person.    Recent Visits Date Type Provider Dept  06/15/22 Office Visit Gillis Santa, MD [facility]  Showing recent visits within past 90 days and meeting all other requirements Today's Visits Date Type Provider Dept  09/13/22 Office Visit Gillis Santa, MD [facility]  Showing today's visits and meeting all other requirements Future Appointments Date Type Provider Dept  12/09/22 Appointment Gillis Santa, MD [facility]  Showing future appointments within next 90 days and meeting all other requirements  I discussed the assessment and  treatment plan with the patient. The patient was provided an opportunity to ask questions and all were answered. The patient agreed with the plan and demonstrated an understanding of the instructions.  Patient advised to call back or seek an in-person evaluation if the symptoms or condition worsens.  Duration of encounter: 83mnutes.  Total time on encounter, as per AMA guidelines included both the face-to-face and non-face-to-face time personally spent by the physician and/or other qualified health care professional(s) on the day of the encounter (includes time in activities that require the physician or other qualified health care professional and does not include time in activities normally performed by clinical staff). Physician's time may include the following activities when performed: preparing to see the patient (eg, review of tests, pre-charting review of records) obtaining and/or reviewing separately obtained history performing a medically appropriate examination and/or evaluation counseling and educating the  patient/family/caregiver ordering medications, tests, or procedures referring and communicating with other health care professionals (when not separately reported) documenting clinical information in the electronic or other health record independently interpreting results (not separately reported) and communicating results to the patient/ family/caregiver care coordination (not separately reported)  Note by: Gillis Santa, MD Date: 09/13/2022; Time: 2:11 PM

## 2022-09-13 NOTE — Progress Notes (Signed)
Nursing Pain Medication Assessment:  Safety precautions to be maintained throughout the outpatient stay will include: orient to surroundings, keep bed in low position, maintain call bell within reach at all times, provide assistance with transfer out of bed and ambulation.  Medication Inspection Compliance: Pill count conducted under aseptic conditions, in front of the patient. Neither the pills nor the bottle was removed from the patient's sight at any time. Once count was completed pills were immediately returned to the patient in their original bottle.  Medication: Hydrocodone/APAP Pill/Patch Count:  0 of 90 pills remain Pill/Patch Appearance: Markings consistent with prescribed medication Bottle Appearance: Standard pharmacy container. Clearly labeled. Filled Date: 33 / 20 / 2023 Last Medication intake:  Ran out of medicine more than 48 hours ago  Belbuca 7/60 Filled 08/06/22

## 2022-09-15 DIAGNOSIS — C50312 Malignant neoplasm of lower-inner quadrant of left female breast: Secondary | ICD-10-CM | POA: Diagnosis not present

## 2022-09-16 DIAGNOSIS — C50312 Malignant neoplasm of lower-inner quadrant of left female breast: Secondary | ICD-10-CM | POA: Diagnosis not present

## 2022-09-16 DIAGNOSIS — Z170 Estrogen receptor positive status [ER+]: Secondary | ICD-10-CM | POA: Diagnosis not present

## 2022-09-17 DIAGNOSIS — C50312 Malignant neoplasm of lower-inner quadrant of left female breast: Secondary | ICD-10-CM | POA: Diagnosis not present

## 2022-09-22 ENCOUNTER — Encounter: Payer: Self-pay | Admitting: Family Medicine

## 2022-09-26 ENCOUNTER — Encounter: Payer: Self-pay | Admitting: Family Medicine

## 2022-09-27 ENCOUNTER — Other Ambulatory Visit: Payer: Self-pay | Admitting: Family Medicine

## 2022-09-27 MED ORDER — EFFEXOR XR 75 MG PO CP24: ORAL_CAPSULE | ORAL | 1 refills | Status: DC

## 2022-09-29 ENCOUNTER — Other Ambulatory Visit: Payer: Self-pay | Admitting: Family Medicine

## 2022-09-29 DIAGNOSIS — F339 Major depressive disorder, recurrent, unspecified: Secondary | ICD-10-CM

## 2022-09-29 NOTE — Telephone Encounter (Signed)
Requested Prescriptions  Pending Prescriptions Disp Refills   busPIRone (BUSPAR) 10 MG tablet [Pharmacy Med Name: BUSPIRONE 10MG TABLETS] 90 tablet 0    Sig: TAKE 1 TABLET(10 MG) BY MOUTH THREE TIMES DAILY     Psychiatry: Anxiolytics/Hypnotics - Non-controlled Passed - 09/29/2022 12:26 PM      Passed - Valid encounter within last 12 months    Recent Outpatient Visits           1 month ago GAD (generalized anxiety disorder)   [facility] Steele Sizer, MD   1 month ago Encounter for [employer] annual wellness exam   [facility] Rory Percy M, DO   1 month ago Dyslipidemia associated with type 2 diabetes mellitus [facility])   [facility] Steele Sizer, MD   2 months ago No-show for appointment   [facility] Steele Sizer, MD   3 months ago Major depression, recurrent, chronic [facility])   [facility] Steele Sizer, MD       Future Appointments             In 2 months Ancil Boozer, Drue Stager, MD [facility]

## 2022-09-30 ENCOUNTER — Encounter: Payer: Self-pay | Admitting: Family Medicine

## 2022-09-30 DIAGNOSIS — Z170 Estrogen receptor positive status [ER+]: Secondary | ICD-10-CM | POA: Diagnosis not present

## 2022-09-30 DIAGNOSIS — D0512 Intraductal carcinoma in situ of left breast: Secondary | ICD-10-CM | POA: Diagnosis not present

## 2022-09-30 DIAGNOSIS — C50912 Malignant neoplasm of unspecified site of left female breast: Secondary | ICD-10-CM | POA: Diagnosis not present

## 2022-10-01 ENCOUNTER — Other Ambulatory Visit: Payer: Self-pay | Admitting: Internal Medicine

## 2022-10-06 ENCOUNTER — Other Ambulatory Visit: Payer: Medicare HMO

## 2022-10-08 DIAGNOSIS — C211 Malignant neoplasm of anal canal: Secondary | ICD-10-CM | POA: Diagnosis not present

## 2022-10-08 DIAGNOSIS — R0902 Hypoxemia: Secondary | ICD-10-CM | POA: Diagnosis not present

## 2022-10-08 DIAGNOSIS — G4733 Obstructive sleep apnea (adult) (pediatric): Secondary | ICD-10-CM | POA: Diagnosis not present

## 2022-10-08 DIAGNOSIS — I270 Primary pulmonary hypertension: Secondary | ICD-10-CM | POA: Diagnosis not present

## 2022-10-08 DIAGNOSIS — J432 Centrilobular emphysema: Secondary | ICD-10-CM | POA: Diagnosis not present

## 2022-10-12 DIAGNOSIS — Z79899 Other long term (current) drug therapy: Secondary | ICD-10-CM | POA: Diagnosis not present

## 2022-10-12 DIAGNOSIS — Z79891 Long term (current) use of opiate analgesic: Secondary | ICD-10-CM | POA: Diagnosis not present

## 2022-10-12 DIAGNOSIS — Z801 Family history of malignant neoplasm of trachea, bronchus and lung: Secondary | ICD-10-CM | POA: Diagnosis not present

## 2022-10-12 DIAGNOSIS — Z87891 Personal history of nicotine dependence: Secondary | ICD-10-CM | POA: Diagnosis not present

## 2022-10-12 DIAGNOSIS — I890 Lymphedema, not elsewhere classified: Secondary | ICD-10-CM | POA: Diagnosis not present

## 2022-10-12 DIAGNOSIS — M1990 Unspecified osteoarthritis, unspecified site: Secondary | ICD-10-CM | POA: Diagnosis not present

## 2022-10-12 DIAGNOSIS — Z9012 Acquired absence of left breast and nipple: Secondary | ICD-10-CM | POA: Diagnosis not present

## 2022-10-12 DIAGNOSIS — I10 Essential (primary) hypertension: Secondary | ICD-10-CM | POA: Diagnosis not present

## 2022-10-12 DIAGNOSIS — K219 Gastro-esophageal reflux disease without esophagitis: Secondary | ICD-10-CM | POA: Diagnosis not present

## 2022-10-12 DIAGNOSIS — M7989 Other specified soft tissue disorders: Secondary | ICD-10-CM | POA: Diagnosis not present

## 2022-10-12 DIAGNOSIS — M79622 Pain in left upper arm: Secondary | ICD-10-CM | POA: Diagnosis not present

## 2022-10-12 DIAGNOSIS — R000 Tachycardia, unspecified: Secondary | ICD-10-CM | POA: Diagnosis not present

## 2022-10-12 DIAGNOSIS — R2232 Localized swelling, mass and lump, left upper limb: Secondary | ICD-10-CM | POA: Diagnosis not present

## 2022-10-12 DIAGNOSIS — Z853 Personal history of malignant neoplasm of breast: Secondary | ICD-10-CM | POA: Diagnosis not present

## 2022-10-12 DIAGNOSIS — E119 Type 2 diabetes mellitus without complications: Secondary | ICD-10-CM | POA: Diagnosis not present

## 2022-10-12 DIAGNOSIS — L02412 Cutaneous abscess of left axilla: Secondary | ICD-10-CM | POA: Diagnosis not present

## 2022-10-12 DIAGNOSIS — Z882 Allergy status to sulfonamides status: Secondary | ICD-10-CM | POA: Diagnosis not present

## 2022-10-12 DIAGNOSIS — J439 Emphysema, unspecified: Secondary | ICD-10-CM | POA: Diagnosis not present

## 2022-10-12 DIAGNOSIS — Z79811 Long term (current) use of aromatase inhibitors: Secondary | ICD-10-CM | POA: Diagnosis not present

## 2022-10-12 DIAGNOSIS — D72829 Elevated white blood cell count, unspecified: Secondary | ICD-10-CM | POA: Diagnosis not present

## 2022-10-12 DIAGNOSIS — Z803 Family history of malignant neoplasm of breast: Secondary | ICD-10-CM | POA: Diagnosis not present

## 2022-10-13 DIAGNOSIS — J432 Centrilobular emphysema: Secondary | ICD-10-CM | POA: Diagnosis not present

## 2022-10-13 DIAGNOSIS — J9811 Atelectasis: Secondary | ICD-10-CM | POA: Diagnosis not present

## 2022-10-13 DIAGNOSIS — R590 Localized enlarged lymph nodes: Secondary | ICD-10-CM | POA: Diagnosis not present

## 2022-10-13 DIAGNOSIS — R928 Other abnormal and inconclusive findings on diagnostic imaging of breast: Secondary | ICD-10-CM | POA: Diagnosis not present

## 2022-10-13 DIAGNOSIS — L02412 Cutaneous abscess of left axilla: Secondary | ICD-10-CM | POA: Diagnosis not present

## 2022-10-14 DIAGNOSIS — R600 Localized edema: Secondary | ICD-10-CM | POA: Diagnosis not present

## 2022-10-14 DIAGNOSIS — L02412 Cutaneous abscess of left axilla: Secondary | ICD-10-CM | POA: Diagnosis not present

## 2022-10-22 DIAGNOSIS — J9610 Chronic respiratory failure, unspecified whether with hypoxia or hypercapnia: Secondary | ICD-10-CM | POA: Diagnosis not present

## 2022-11-02 DIAGNOSIS — Z9981 Dependence on supplemental oxygen: Secondary | ICD-10-CM | POA: Diagnosis not present

## 2022-11-02 DIAGNOSIS — G4733 Obstructive sleep apnea (adult) (pediatric): Secondary | ICD-10-CM | POA: Diagnosis not present

## 2022-11-02 DIAGNOSIS — R0609 Other forms of dyspnea: Secondary | ICD-10-CM | POA: Diagnosis not present

## 2022-11-02 DIAGNOSIS — G4736 Sleep related hypoventilation in conditions classified elsewhere: Secondary | ICD-10-CM | POA: Diagnosis not present

## 2022-11-02 DIAGNOSIS — J438 Other emphysema: Secondary | ICD-10-CM | POA: Diagnosis not present

## 2022-11-02 DIAGNOSIS — J9611 Chronic respiratory failure with hypoxia: Secondary | ICD-10-CM | POA: Diagnosis not present

## 2022-11-04 ENCOUNTER — Other Ambulatory Visit: Payer: Self-pay | Admitting: Family Medicine

## 2022-11-04 DIAGNOSIS — F339 Major depressive disorder, recurrent, unspecified: Secondary | ICD-10-CM

## 2022-11-08 DIAGNOSIS — C211 Malignant neoplasm of anal canal: Secondary | ICD-10-CM | POA: Diagnosis not present

## 2022-11-08 DIAGNOSIS — R0902 Hypoxemia: Secondary | ICD-10-CM | POA: Diagnosis not present

## 2022-11-08 DIAGNOSIS — G4733 Obstructive sleep apnea (adult) (pediatric): Secondary | ICD-10-CM | POA: Diagnosis not present

## 2022-11-08 DIAGNOSIS — J432 Centrilobular emphysema: Secondary | ICD-10-CM | POA: Diagnosis not present

## 2022-11-08 DIAGNOSIS — I270 Primary pulmonary hypertension: Secondary | ICD-10-CM | POA: Diagnosis not present

## 2022-11-23 ENCOUNTER — Other Ambulatory Visit: Payer: Self-pay

## 2022-11-23 DIAGNOSIS — C210 Malignant neoplasm of anus, unspecified: Secondary | ICD-10-CM

## 2022-11-24 ENCOUNTER — Encounter: Payer: Self-pay | Admitting: Oncology

## 2022-11-24 ENCOUNTER — Inpatient Hospital Stay: Payer: Medicare HMO | Attending: Oncology

## 2022-11-24 ENCOUNTER — Inpatient Hospital Stay (HOSPITAL_BASED_OUTPATIENT_CLINIC_OR_DEPARTMENT_OTHER): Payer: Medicare HMO | Admitting: Oncology

## 2022-11-24 VITALS — BP 119/71 | HR 75 | Temp 98.4°F | Resp 18 | Wt 180.9 lb

## 2022-11-24 DIAGNOSIS — Z79811 Long term (current) use of aromatase inhibitors: Secondary | ICD-10-CM | POA: Insufficient documentation

## 2022-11-24 DIAGNOSIS — C50919 Malignant neoplasm of unspecified site of unspecified female breast: Secondary | ICD-10-CM | POA: Diagnosis not present

## 2022-11-24 DIAGNOSIS — M8580 Other specified disorders of bone density and structure, unspecified site: Secondary | ICD-10-CM | POA: Insufficient documentation

## 2022-11-24 DIAGNOSIS — C50312 Malignant neoplasm of lower-inner quadrant of left female breast: Secondary | ICD-10-CM | POA: Diagnosis not present

## 2022-11-24 DIAGNOSIS — C210 Malignant neoplasm of anus, unspecified: Secondary | ICD-10-CM

## 2022-11-24 DIAGNOSIS — Z170 Estrogen receptor positive status [ER+]: Secondary | ICD-10-CM | POA: Insufficient documentation

## 2022-11-24 LAB — CMP (CANCER CENTER ONLY)
ALT: 44 U/L (ref 0–44)
AST: 47 U/L — ABNORMAL HIGH (ref 15–41)
Albumin: 4.2 g/dL (ref 3.5–5.0)
Alkaline Phosphatase: 107 U/L (ref 38–126)
Anion gap: 9 (ref 5–15)
BUN: 16 mg/dL (ref 8–23)
CO2: 27 mmol/L (ref 22–32)
Calcium: 9.5 mg/dL (ref 8.9–10.3)
Chloride: 102 mmol/L (ref 98–111)
Creatinine: 0.87 mg/dL (ref 0.44–1.00)
GFR, Estimated: >60 mL/min (ref >60)
Glucose, Bld: 117 mg/dL — ABNORMAL HIGH (ref 70–99)
Potassium: 3.9 mmol/L (ref 3.5–5.1)
Sodium: 138 mmol/L (ref 135–145)
Total Bilirubin: 0.4 mg/dL (ref 0.3–1.2)
Total Protein: 7.9 g/dL (ref 6.5–8.1)

## 2022-11-24 LAB — CBC WITH DIFFERENTIAL (CANCER CENTER ONLY)
Abs Immature Granulocytes: 0.03 10*3/uL (ref 0.00–0.07)
Basophils Absolute: 0.1 10*3/uL (ref 0.0–0.1)
Basophils Relative: 1 %
Eosinophils Absolute: 0.4 10*3/uL (ref 0.0–0.5)
Eosinophils Relative: 4 %
HCT: 38.1 % (ref 36.0–46.0)
Hemoglobin: 12.2 g/dL (ref 12.0–15.0)
Immature Granulocytes: 0 %
Lymphocytes Relative: 17 %
Lymphs Abs: 1.6 10*3/uL (ref 0.7–4.0)
MCH: 28.7 pg (ref 26.0–34.0)
MCHC: 32 g/dL (ref 30.0–36.0)
MCV: 89.6 fL (ref 80.0–100.0)
Monocytes Absolute: 0.7 10*3/uL (ref 0.1–1.0)
Monocytes Relative: 7 %
Neutro Abs: 6.8 10*3/uL (ref 1.7–7.7)
Neutrophils Relative %: 71 %
Platelet Count: 257 10*3/uL (ref 150–400)
RBC: 4.25 MIL/uL (ref 3.87–5.11)
RDW: 14.9 % (ref 11.5–15.5)
WBC Count: 9.5 10*3/uL (ref 4.0–10.5)
nRBC: 0 % (ref 0.0–0.2)

## 2022-11-24 MED ORDER — ANASTROZOLE 1 MG PO TABS: 1.0000 mg | ORAL_TABLET | Freq: Every day | ORAL | 1 refills | Status: DC

## 2022-11-24 NOTE — Assessment & Plan Note (Addendum)
Left breast invasive mammary carcinoma pT1b pN0, s/p lumpectomy with sentinel lymph node biopsy Oncotype DX 8, Distant recurrence at 8 years 3%, chemotherapy benefit <1%, no need for adjuvant chemo [facility] -recommend to omit  adjuvant radiation due to her lung disease.  She tolerates Arimidex  daily. Refills were sent.   Annual mammogram bilaterally- Oct 2024

## 2022-11-24 NOTE — Progress Notes (Signed)
Hematology/Oncology Progress note [phone] [phone]     CHIEF COMPLAINTS/PURPOSE OF CONSULTATION:  Left breast invasive cancer   ASSESSMENT & PLAN:   Invasive carcinoma of breast (HCC) Left breast invasive mammary carcinoma pT1b pN0, s/p lumpectomy with sentinel lymph node biopsy Oncotype DX 8, Distant recurrence at 9 years 3%, chemotherapy benefit <1%, no need for adjuvant chemo [facility] -recommend to omit  adjuvant radiation due to her lung disease.  She tolerates Arimidex  daily. Refills were sent.   Annual mammogram bilaterally- Oct 2024   Aromatase inhibitor use Recommend baseline DEXA    Orders Placed This Encounter  Procedures   MM 3D DIAGNOSTIC MAMMOGRAM BILATERAL BREAST    Standing Status:   Future    Standing Expiration Date:   11/24/2023    Order Specific Question:   Reason for Exam (SYMPTOM  OR DIAGNOSIS REQUIRED)    Answer:   invasive carcinoma of breast    Order Specific Question:   Preferred imaging location?    Answer:   [facility]   [place] LIMITED ULTRASOUND INCLUDING AXILLA LEFT BREAST     Standing Status:   Future    Standing Expiration Date:   11/24/2023    Order Specific Question:   Reason for Exam (SYMPTOM  OR DIAGNOSIS REQUIRED)    Answer:   hx breast cancer    Order Specific Question:   Preferred imaging location?    Answer:   [facility]   [place] LIMITED ULTRASOUND INCLUDING AXILLA RIGHT BREAST    Standing Status:   Future    Standing Expiration Date:   11/24/2023    Order Specific Question:   Reason for Exam (SYMPTOM  OR DIAGNOSIS REQUIRED)    Answer:   hx breast cancer    Order Specific Question:   Preferred imaging location?    Answer:   [facility]   CMP (Cancer Center only)    Standing Status:   Future    Standing Expiration Date:   11/24/2023   CBC with Differential (Cancer Center Only)    Standing Status:   Future    Standing Expiration Date:   11/24/2023   Follow up in 6 months.  All  questions were answered. The patient knows to call the clinic with any problems, questions or concerns.  Rickard Patience, MD, PhD [facility] Hematology Oncology 11/24/2022        HISTORY OF PRESENTING ILLNESS:  Judy Allen 67 y.o. female presents to establish care for left breast invasive cancer.  I have reviewed her chart and materials related to her cancer extensively and collaborated history with the patient. Summary of oncologic history is as follows: Oncology History  Anal squamous cell carcinoma  03/09/2016 Initial Diagnosis   Anal squamous cell carcinoma   -Stage IIIb Squamous Cell Carcinoma of the Anus: Initially cT2 N2 M0 with left inguinal node involvement. Notably was HIV negative. Completed definitive chemoradiation with concurrent 5FU/mitomycin 05/21/16, for curative intent.     Invasive carcinoma of breast  05/27/2022 Mammogram   In the left breast, a possible mass warrants further evaluation. In the right breast, no findings suspicious for malignancy. Further evaluation is suggested for a possible mass in the left breast.   06/16/2022 Mammogram   Unilateral left diagnostic mammogram  There is an indeterminate 7 mm mass in the LEFT breast at 8 o'clock 5 cm from the nipple which is favored to correspond to the site of screening mammographic concern. Recommend ultrasound-guided biopsy for definitive characterization with  attention on post marker placement mammogram to assess for mammographic/sonographic correlation.   07/05/2022 Initial Diagnosis   Invasive carcinoma of breast (HCC)  -left breast lesion core biopsy Showed invasive mammary carcinoma, no special type.  Grade 1, DCIS not identified.  LVI not identified, ER/PR 90% positive, HER2 equivocal IHC 2+, HER2 FISH negative.    07/09/2022 Cancer Staging   Staging form: Breast, AJCC 8th Edition - Clinical stage from 07/09/2022: Stage IA (cT1b, cN0, cM0, G1, ER+, PR+, HER2-) - Signed by Rickard Patience, MD on 07/15/2022 Stage  prefix: Initial diagnosis Histologic grading system: 3 grade system   07/26/2022 Surgery   Patient underwent left breast lumpectomy and SLNB  invasive mammary carcinoma, no special type.  Grade 1, DCIS+ low grade, all 3 sentinel lymph nodes negative.  All margins negative. pT1b pN0   07/26/2022 Oncotype testing   Oncotype Dx 8, Distant recurrence at 9 years 3%, chemotherapy benefit <1%   07/26/2022 Cancer Staging   Staging form: Breast, AJCC 8th Edition - Pathologic stage from 07/26/2022: Stage IA (pT1b, pN0, cM0, G1, ER+, PR+, HER2-, Oncotype DX score: 8) - Signed by Rickard Patience, MD on 08/13/2022 Stage prefix: Initial diagnosis Multigene prognostic tests performed: Oncotype DX Recurrence score range: Less than 11 Histologic grading system: 3 grade system    Genetic Testing   Negative genetic testing. No pathogenic variants identified on the [facility] Common Hereditary Cancers+RNA panel. VUS in POLE called c.623C>G identified. The report date is 08/18/2022.  The Common Hereditary Cancers Panel + RNA offered by [facility] includes sequencing and/or deletion duplication testing of the following 48 genes: APC*, ATM*, AXIN2, BAP1, BARD1, BMPR1A, BRCA1, BRCA2, BRIP1, CDH1, CDK4, CDKN2A (p14ARF), CDKN2A (p16INK4a), CHEK2, CTNNA1, DICER1*, EPCAM*, FH*, GREM1*, HOXB13, KIT, MBD4, MEN1*, MLH1*, MSH2*, MSH3*, MSH6*, MUTYH, NF1*, NTHL1, PALB2, PDGFRA, PMS2*, POLD1*, POLE, PTEN*, RAD51C, RAD51D, SDHA*, SDHB, SDHC*, SDHD, SMAD4, SMARCA4, STK11, TP53, TSC1*, TSC2, VHL.     Menarche 67 years of age Patient has 3 children. Age at first childbirth 32 years old, Previous OCP use less than 5 years Postmenopausal, last menstrual period July 2003-age of 45, Patient has had a hysterectomy. She has a history of brief hormone replacement therapy, 2 weeks, discontinued due to intolerance. No previous breast biopsies.  Family history positive for maternal aunt with breast cancer.  Father with liver cancer. Patient  has history of chronic respiratory failure due to pulmonary hypertension.  Patient is on nasal cannula oxygen.  She follows up with [facility] pulmonology    INTERVAL HISTORY Judy Allen is a 67 y.o. female who has above history reviewed by me today presents for follow up visit for Stage 1 left breast cancer.  She tolerates Arimidex 1mg  dialy. Manageable side effects.    MEDICAL HISTORY:  Past Medical History:  Diagnosis Date   Anal squamous cell carcinoma 03/10/2016   a.) clinical stage IIIB (T2, N2, M0); Tx'd with chemotherapy + XRT   Anemia    Anxiety    Aortic atherosclerosis    Arthritis    Breast cancer, left 07/10/2022   Complication of anesthesia    a.) PONV   Constipation    COPD (chronic obstructive pulmonary disease)    Coronary artery calcification seen on CT scan    Depression    Diastolic dysfunction 06/08/2021   a.) TTE 06/08/2021: EF 55-60%; mild MR; PASP 47 mmHg; G1DD.   Emphysema of lung    GERD (gastroesophageal reflux disease)    Goals of care, counseling/discussion 07/10/2022   H/O  allergic rhinitis    Heart murmur    Hemorrhoid    History of fusion of cervical spine    a.) s/p ACDF C4-C7   History of kidney stones    Hyperlipidemia    Hyperparathyroidism    Hypertension    Macular degeneration    Methemoglobinemia 09/07/2016   Neck pain, chronic    Neuritis    OSA on CPAP 09/12/2016   Ovarian failure    Oxygen dependent    a.) 2 L/[place] (day) with increase to 4 L/[place] (night)   Personal history of chemotherapy    Personal history of radiation therapy    Pneumonitis 2017   a.) related to MITOCYCIN administration for squamous cell anal cancer   PONV (postoperative nausea and vomiting)    Proteinuria    Psoriasis    Pulmonary HTN 06/22/2005   a.) TTE 06/22/2005: EF 61%; RVSP 31. b.) TTE 12/18/2009: EF 57%; RVSP 19.9. c.) TTE 05/20/2016: EF >55%; PASP 43. d.) TTE 04/07/2017: EF >55%; PASP 26. e.) TTE 03/12/2018: EF >55%; PASP 26. f.) TTE 06/08/2021: EF  55-60%; PASP 47.   Steatohepatitis    a.) grade II (moderate) on 09/2017 Bx   T2DM (type 2 diabetes mellitus)    Tachycardia, paroxysmal    Urinary incontinence     SURGICAL HISTORY: Past Surgical History:  Procedure Laterality Date   ANTERIOR AND POSTERIOR REPAIR N/A 12/28/2021   Procedure: ANTERIOR (CYSTOCELE) AND POSTERIOR REPAIR (RECTOCELE);  Surgeon: Hildred Laser, MD;  Location: [facility];  Service: Gynecology;  Laterality: N/A;   ANTERIOR CERVICAL DECOMP/DISCECTOMY FUSION N/A 2001   PROCEDURE: ACDF C4-C7   ANTERIOR FUSION LUMBAR SPINE  1990   plate in back, not metal   BREAST BIOPSY Left 07/05/2022   [place] Bx, Ribbon Clip, Path Pending   BREAST BIOPSY Left 07/05/2022   [place] LT BREAST BX W LOC DEV 1ST LESION IMG BX SPEC US GUIDE 07/05/2022 [facility]   BREAST BIOPSY Left 07/22/2022   [place] LT RADIO FREQUENCY TAG LOC US GUIDE 07/22/2022 [facility]   CARDIAC CATHETERIZATION  2009?    [facility]   CARDIAC CATHETERIZATION  05/2012   RHC: Mild hypertension 37/12 with a mean pressure of 21 mm mercury   CHOLECYSTECTOMY N/A 09/12/2017   Procedure: LAPAROSCOPIC CHOLECYSTECTOMY WITH INTRAOPERATIVE CHOLANGIOGRAM;  Surgeon: Earline Mayotte, MD;  Location: [facility];  Service: General;  Laterality: N/A;   COLONOSCOPY  2018   LIVER BIOPSY N/A 09/12/2017   Procedure: LIVER BIOPSY;  Surgeon: Earline Mayotte, MD;  Location: [facility];  Service: General;  Laterality: N/A;   MASS EXCISION Left 02/23/2016   Procedure: EXCISION MASS;  Surgeon: Kieth Brightly, MD;  Location: [facility];  Service: General;  Laterality: Left;   PART MASTECTOMY,RADIO FREQUENCY LOCALIZER,AXILLARY SENTINEL NODE BIOPSY Left 07/26/2022   Procedure: PART MASTECTOMY,RADIO FREQUENCY LOCALIZER,AXILLARY SENTINEL NODE BIOPSY;  Surgeon: Carolan Shiver, MD;  Location: [facility];  Service: General;  Laterality: Left;   PORT-A-CATH REMOVAL  2018   PORTACATH PLACEMENT Left 03/12/2016   Procedure: INSERTION  PORT-A-CATH;  Surgeon: Kieth Brightly, MD;  Location: [facility];  Service: General;  Laterality: Left;   TONSILLECTOMY Bilateral 1959   TRIGGER FINGER RELEASE Right 09/2018   Dr. Hyacinth Meeker    TUBAL LIGATION     s/p   VAGINAL HYSTERECTOMY N/A 12/28/2021   Procedure: HYSTERECTOMY VAGINAL;  Surgeon: Hildred Laser, MD;  Location: [facility];  Service: Gynecology;  Laterality: N/A;    SOCIAL HISTORY: Social History   Socioeconomic History  Marital status: Widowed    Spouse name: Not on file   Number of children: 3   Years of education: Not on file   Highest education level: 11th grade  Occupational History   Not on file  Tobacco Use   Smoking status: Former    Packs/day: 1.00    Years: 32.00    Additional pack years: 0.00    Total pack years: 32.00    Types: Cigarettes    Start date: 08/10/1963    Quit date: 01/15/2006    Years since quitting: 16.8   Smokeless tobacco: Never  Vaping Use   Vaping Use: Never used  Substance and Sexual Activity   Alcohol use: No   Drug use: No   Sexual activity: Not Currently    Birth control/protection: Surgical  Other Topics Concern   Not on file  Social History Narrative   Husband passed away on 2018-01-12   She was living with Marchelle Folks for few years, but moved in with Candace her oldest daughter this past Summer.    Social Determinants of Health   Financial Resource Strain: Low Risk  (08/30/2022)   Overall Financial Resource Strain (CARDIA)    Difficulty of Paying Living Expenses: Not hard at all  Food Insecurity: No Food Insecurity (08/30/2022)   Hunger Vital Sign    Worried About Running Out of Food in the Last Year: Never true    Ran Out of Food in the Last Year: Never true  Transportation Needs: No Transportation Needs (08/30/2022)   PRAPARE - Administrator, Civil Service (Medical): No    Lack of Transportation (Non-Medical): No  Physical Activity: Inactive (08/30/2022)   Exercise Vital Sign    Days of Exercise per  Week: 0 days    Minutes of Exercise per Session: 0 min  Stress: No Stress Concern Present (08/30/2022)   [employer] of Occupational Health - Occupational Stress Questionnaire    Feeling of Stress : Not at all  Social Connections: Socially Isolated (08/30/2022)   Social Connection and Isolation Panel [NHANES]    Frequency of Communication with Friends and Family: Once a week    Frequency of Social Gatherings with Friends and Family: Once a week    Attends Religious Services: 1 to 4 times per year    Active Member of [employer] or Organizations: No    Attends Banker Meetings: Never    Marital Status: Widowed  Intimate Partner Violence: Not At Risk (08/30/2022)   Humiliation, Afraid, Rape, and Kick questionnaire    Fear of Current or Ex-Partner: No    Emotionally Abused: No    Physically Abused: No    Sexually Abused: No    FAMILY HISTORY: Family History  Problem Relation Age of Onset   Heart attack Mother    Asthma Mother    Liver cancer Father 7   Cancer Brother        unk type; metastatic   Breast cancer Maternal Aunt        dx 20s-30s   Breast cancer Cousin        dx 23s   Bladder Cancer Neg Hx    Kidney cancer Neg Hx    Colon cancer Neg Hx     ALLERGIES:  is allergic to aczone [dapsone], atarax [hydroxyzine], hydrochlorothiazide, sulfa antibiotics, and sulfasalazine.  MEDICATIONS:  Current Outpatient Medications  Medication Sig Dispense Refill   albuterol (PROVENTIL HFA;VENTOLIN HFA) 108 (90 Base) MCG/ACT inhaler Inhale 2 puffs into  the lungs every 6 (six) hours as needed for wheezing or shortness of breath.     Buprenorphine HCl (BELBUCA) 600 MCG FILM Place 1 Film inside cheek every 12 (twelve) hours. 60 each 2   busPIRone (BUSPAR) 10 MG tablet TAKE 1 TABLET(10 MG) BY MOUTH THREE TIMES DAILY 90 tablet 0   Cholecalciferol (VITAMIN D3) 1000 units CAPS Take 1 capsule by mouth daily.     EFFEXOR XR 75 MG 24 hr capsule TAKE 3 CAPSULES DAILY WITH BREAKFAST  270 capsule 1   gabapentin (NEURONTIN) 400 MG capsule Take 1 capsule (400 mg total) by mouth 3 (three) times daily. 90 capsule 2   metoprolol succinate (TOPROL-XL) 50 MG 24 hr tablet Take 50 mg by mouth in the morning.     Multiple Vitamins-Minerals (EYE VITAMINS & MINERALS PO) Take 1 capsule by mouth in the morning. [employer] Eye Vitamins     NIFEdipine (PROCARDIA-XL/NIFEDICAL-XL) 30 MG 24 hr tablet Take 30 mg by mouth at bedtime.     omega-3 acid ethyl esters (LOVAZA) 1 g capsule TAKE 2 CAPSULES BY MOUTH TWO TIMES A DAY 360 capsule 1   omeprazole (PRILOSEC) 40 MG capsule TAKE 1 CAPSULE DAILY 90 capsule 3   OXYGEN Inhale 2-4 L into the lungs continuous. 2 L qd and 4 L qhs     Potassium Gluconate 80 MG TABS Take 1 tablet by mouth daily.     tiZANidine (ZANAFLEX) 4 MG tablet Take 1 tablet (4 mg total) by mouth every 8 (eight) hours as needed. 270 tablet 2   torsemide (DEMADEX) 5 MG tablet Take 5 mg by mouth every morning.     umeclidinium-vilanterol (ANORO ELLIPTA) 62.5-25 MCG/ACT AEPB Inhale 1 puff into the lungs in the morning.     anastrozole (ARIMIDEX) 1 MG tablet Take 1 tablet (1 mg total) by mouth daily. 90 tablet 1   No current facility-administered medications for this visit.    Review of Systems  Constitutional:  Negative for appetite change, chills, fatigue and fever.  HENT:   Negative for hearing loss and voice change.   Eyes:  Negative for eye problems.  Respiratory:  Positive for shortness of breath. Negative for chest tightness and cough.   Cardiovascular:  Negative for chest pain.  Gastrointestinal:  Negative for abdominal distention, abdominal pain and blood in stool.  Endocrine: Negative for hot flashes.  Genitourinary:  Negative for difficulty urinating and frequency.   Musculoskeletal:  Negative for arthralgias.  Skin:  Negative for itching and rash.  Neurological:  Negative for extremity weakness.  Hematological:  Negative for adenopathy.  Psychiatric/Behavioral:   Negative for confusion.    Marland Kitchen    PHYSICAL EXAMINATION: ECOG PERFORMANCE STATUS: 1 - Symptomatic but completely ambulatory  Vitals:   11/24/22 1010  BP: 119/71  Pulse: 75  Resp: 18  Temp: 98.4 F (36.9 C)  SpO2: 90%   Filed Weights   11/24/22 1010  Weight: 180 lb 14.4 oz (82.1 kg)    Physical Exam Constitutional:      General: She is not in acute distress.    Appearance: She is not diaphoretic.  HENT:     Head: Normocephalic and atraumatic.     Nose: Nose normal.     Mouth/Throat:     Pharynx: No oropharyngeal exudate.  Eyes:     General: No scleral icterus.    Pupils: Pupils are equal, round, and reactive to light.  Cardiovascular:     Rate and Rhythm: Normal rate and regular rhythm.  Heart sounds: No murmur heard. Pulmonary:     Effort: Pulmonary effort is normal. No respiratory distress.     Breath sounds: No rales.  Chest:     Chest wall: No tenderness.  Abdominal:     General: There is no distension.     Palpations: Abdomen is soft.     Tenderness: There is no abdominal tenderness.  Musculoskeletal:        General: Normal range of motion.     Cervical back: Normal range of motion and neck supple.  Skin:    General: Skin is warm and dry.     Findings: No erythema.  Neurological:     Mental Status: She is alert and oriented to person, place, and time.     Cranial Nerves: No cranial nerve deficit.     Motor: No abnormal muscle tone.     Coordination: Coordination normal.  Psychiatric:        Mood and Affect: Affect normal.    Breast exam was performed in seated and lying down position. Patient is status post left breast biopsy, focal bruising at biopsy site. .  No palpable breast masses bilaterally.  No palpable axillary adenopathy bilaterally  LABORATORY DATA:  I have reviewed the data as listed    Latest Ref Rng & Units 11/24/2022    9:42 AM 08/03/2022    3:26 PM 07/27/2022    5:38 AM  CBC  WBC 4.0 - 10.5 K/uL 9.5  8.8  9.0   Hemoglobin 12.0  - 15.0 g/dL 40.9  81.1  91.4   Hematocrit 36.0 - 46.0 % 38.1  34.1  31.1   Platelets 150 - 400 K/uL 257  281  165       Latest Ref Rng & Units 11/24/2022    9:42 AM 08/03/2022    3:26 PM 07/27/2022    5:38 AM  CMP  Glucose 70 - 99 mg/dL 782  97  956   BUN 8 - 23 mg/dL 16  11  16    Creatinine 0.44 - 1.00 mg/dL 2.13  0.86  5.78   Sodium 135 - 145 mmol/L 138  143  141   Potassium 3.5 - 5.1 mmol/L 3.9  4.4  4.0   Chloride 98 - 111 mmol/L 102  103  108   CO2 22 - 32 mmol/L 27  31  28    Calcium 8.9 - 10.3 mg/dL 9.5  9.4  8.7   Total Protein 6.5 - 8.1 g/dL 7.9  7.1    Total Bilirubin 0.3 - 1.2 mg/dL 0.4  0.4    Alkaline Phos 38 - 126 U/L 107     AST 15 - 41 U/L 47  46    ALT 0 - 44 U/L 44  47       RADIOGRAPHIC STUDIES: I have personally reviewed the radiological images as listed and agreed with the findings in the report. No results found.

## 2022-11-24 NOTE — Progress Notes (Signed)
Pt here for follow up. No new concerns voiced.   

## 2022-11-24 NOTE — Assessment & Plan Note (Signed)
Recommend baseline DEXA

## 2022-11-25 ENCOUNTER — Ambulatory Visit
Admission: RE | Admit: 2022-11-25 | Discharge: 2022-11-25 | Disposition: A | Discharge status: Home / Self Care | Payer: Medicare HMO | Source: Ambulatory Visit | Attending: Oncology | Admitting: Oncology

## 2022-11-25 DIAGNOSIS — Z780 Asymptomatic menopausal state: Secondary | ICD-10-CM | POA: Diagnosis not present

## 2022-11-25 DIAGNOSIS — M8589 Other specified disorders of bone density and structure, multiple sites: Secondary | ICD-10-CM | POA: Diagnosis not present

## 2022-11-25 DIAGNOSIS — C50919 Malignant neoplasm of unspecified site of unspecified female breast: Secondary | ICD-10-CM | POA: Insufficient documentation

## 2022-11-25 DIAGNOSIS — M85852 Other specified disorders of bone density and structure, left thigh: Secondary | ICD-10-CM | POA: Diagnosis not present

## 2022-11-25 DIAGNOSIS — Z803 Family history of malignant neoplasm of breast: Secondary | ICD-10-CM | POA: Diagnosis not present

## 2022-12-03 ENCOUNTER — Ambulatory Visit: Payer: Medicare HMO | Admitting: Family Medicine

## 2022-12-05 ENCOUNTER — Other Ambulatory Visit: Payer: Self-pay | Admitting: Family Medicine

## 2022-12-05 DIAGNOSIS — F339 Major depressive disorder, recurrent, unspecified: Secondary | ICD-10-CM

## 2022-12-06 NOTE — Progress Notes (Deleted)
Name: Judy Allen   MRN: [id number]    DOB: 1956/03/07   Date:12/06/2022       Progress Note  Subjective  Chief Complaint  Follow Up  HPI  History of anal cancer: patient was diagnosed with anal cancer in 2017, biopsy done by Dr. Evette Cristal and treated with radiation at [facility].Doing well now  Breast cancer - left side: found by abnormal mammogram and biopsy positive 07/05/2022 , invasive mammary carcinoma. She will go to [facility] for radiation therapy   MDD: long history of depression, currently on Effexor 225 mg daily , she used to take abilify but it caused tremors.after that we tried Vraylar but also caused tremors once she went up on the dose. She is currently on ly Effexor and buspar, feeling more anxious lately ( recent diagnosis of breast cancer) explained we can try increasing frequency of buspar to 3 times daily and if needed go up on the dose to 10 mg TID.  She does not want to see a psychiatrist . She went to [facility] in the past but does not want to go back at this time.   Hypertriglyceridemiareviewed last labs done in our office was 07/2021 and LDL was 70 . Currently taking Crestor , Lovaza and Gemfibrozil , denies side effects.We will recheck labs    HTN/Paroxysmal tachycardia: . She is currently taking Norvasc 2.5 , Telmisartan 40 Metoprolol 50mg  , she is under the care of Dr. Austin Miles - nephrologist, last visit Oct 2023. She has good urine output, denies any pruritus    Hyperparathyroidism: diagnosed years ago and used to see Dr. Tedd Sias. She has been off HCTZ and last parathyroid test and vitamin D back to normal and we will not recheck it today    Emphysema and hypoxemia: on oxygen 2  liters during the day and 4 liters when sleeping . She continues to have sob is stable and only with activity, denies current cough but has noticed mild wheezing since yesterday  . Under the care of pulmonologist at [facility].    DMII: diagnosed 02/2018 by Dr. Tedd Sias, hgbA1C was at 6.6%, A1C  but has been off Metformin  and A1C has been within normal limits, down to 6.2 %  % today.  She has diabetes neuropathy - pain on both feet . She also has dyslipidemia.   Continue Gabapentin for foot pain .    Chronic pain: under the care of Dr. Cherylann Ratel, she states she is doing well on Gabapentin , recently starting on new pain management - of Buprenorphine film , also taking muscle relaxer prn .She has a history of laminectomy. She has neck and back pain, pain level right now is 2/10   Senile purpura: she states not as bad now, usually on arms Unchanged and reassurance given   Psoriasis: she is doing well, no current rashes .She lost to follow up and is out of creams and injections . She asked for topical medication, advised to contact hime.   Patient Active Problem List   Diagnosis Date Noted   Aromatase inhibitor use 11/24/2022   Macular degeneration 08/30/2022   Genetic testing 08/20/2022   Breast cancer (HCC) 07/26/2022   Breast cancer, left (HCC) 07/10/2022   Invasive carcinoma of breast (HCC) 07/10/2022   Goals of care, counseling/discussion 07/10/2022   Cystocele with rectocele 12/28/2021   S/P vaginal hysterectomy 12/28/2021   Bilateral hip pain 11/03/2021   Hypotension due to drugs 05/03/2021   Disorder of SI (sacroiliac) joint 11/11/2020   Arthritis  of sacroiliac joint of both sides 11/11/2020   Chronic radicular lumbar pain 11/11/2020   Hyperparathyroidism (HCC) 08/25/2019   Diabetes mellitus type 2 in obese 11/06/2018   Cervical radiculopathy (left) 08/31/2018   Osteoarthritis of spine with radiculopathy, cervical region 08/31/2018   History of anal cancer 05/30/2018   Myofascial pain syndrome, cervical 05/16/2018   Chronic pain syndrome 05/16/2018   Cervical facet joint syndrome 05/16/2018   Perihepatitis (HCC) 09/27/2017   Fatty liver 12/22/2016   Chronic respiratory failure with hypoxia, on home oxygen therapy (HCC) 07/28/2016   Centrilobular emphysema (HCC) 07/14/2016   Abnormal Pap smear of  cervix 04/14/2016   Anal squamous cell carcinoma (HCC) 03/09/2016   Non-thrombocytopenic purpura (HCC) 01/12/2016   Chronic constipation 10/16/2015   History of fusion of cervical spine 04/02/2015   Benign hypertension 01/16/2015   Chronic cervical pain 01/16/2015   CN (constipation) 01/16/2015   Gastric reflux 01/16/2015   Hypertriglyceridemia 01/16/2015   Edema leg 01/16/2015   Chronic recurrent major depressive disorder (HCC) 01/16/2015   Dysmetabolic syndrome 01/16/2015   Obstructive apnea 01/16/2015   Psoriasis 01/16/2015   Allergic rhinitis 01/16/2015   Bursitis, trochanteric 01/16/2015   GAD (generalized anxiety disorder) 01/16/2015   Tachycardia, paroxysmal (HCC)     Past Surgical History:  Procedure Laterality Date   ANTERIOR AND POSTERIOR REPAIR N/A 12/28/2021   Procedure: ANTERIOR (CYSTOCELE) AND POSTERIOR REPAIR (RECTOCELE);  Surgeon: Hildred Laser, MD;  Location: [facility];  Service: Gynecology;  Laterality: N/A;   ANTERIOR CERVICAL DECOMP/DISCECTOMY FUSION N/A 2001   PROCEDURE: ACDF C4-C7   ANTERIOR FUSION LUMBAR SPINE  1990   plate in back, not metal   BREAST BIOPSY Left 07/05/2022   [place] Bx, Ribbon Clip, Path Pending   BREAST BIOPSY Left 07/05/2022   [place] LT BREAST BX W LOC DEV 1ST LESION IMG BX SPEC US GUIDE 07/05/2022 [facility]   BREAST BIOPSY Left 07/22/2022   [place] LT RADIO FREQUENCY TAG LOC US GUIDE 07/22/2022 [facility]   CARDIAC CATHETERIZATION  2009?    [facility]   CARDIAC CATHETERIZATION  05/2012   [facility]: Mild hypertension 37/12 with a mean pressure of 21 mm mercury   CHOLECYSTECTOMY N/A 09/12/2017   Procedure: LAPAROSCOPIC CHOLECYSTECTOMY WITH INTRAOPERATIVE CHOLANGIOGRAM;  Surgeon: Earline Mayotte, MD;  Location: [facility];  Service: General;  Laterality: N/A;   COLONOSCOPY  2018   LIVER BIOPSY N/A 09/12/2017   Procedure: LIVER BIOPSY;  Surgeon: Earline Mayotte, MD;  Location: [facility];  Service: General;  Laterality: N/A;   MASS EXCISION  Left 02/23/2016   Procedure: EXCISION MASS;  Surgeon: Kieth Brightly, MD;  Location: [facility];  Service: General;  Laterality: Left;   PART MASTECTOMY,RADIO FREQUENCY LOCALIZER,AXILLARY SENTINEL NODE BIOPSY Left 07/26/2022   Procedure: PART MASTECTOMY,RADIO FREQUENCY LOCALIZER,AXILLARY SENTINEL NODE BIOPSY;  Surgeon: Carolan Shiver, MD;  Location: [facility];  Service: General;  Laterality: Left;   PORT-A-CATH REMOVAL  2018   PORTACATH PLACEMENT Left 03/12/2016   Procedure: INSERTION PORT-A-CATH;  Surgeon: Kieth Brightly, MD;  Location: [facility];  Service: General;  Laterality: Left;   TONSILLECTOMY Bilateral 1959   TRIGGER FINGER RELEASE Right 09/2018   Dr. Hyacinth Meeker    TUBAL LIGATION     s/p   VAGINAL HYSTERECTOMY N/A 12/28/2021   Procedure: HYSTERECTOMY VAGINAL;  Surgeon: Hildred Laser, MD;  Location: [facility];  Service: Gynecology;  Laterality: N/A;    Family History  Problem Relation Age of Onset   Heart attack Mother    Asthma Mother  Liver cancer Father 56   Cancer Brother        unk type; metastatic   Breast cancer Maternal Aunt        dx 20s-30s   Breast cancer Cousin        dx 66s   Bladder Cancer Neg Hx    Kidney cancer Neg Hx    Colon cancer Neg Hx     Social History   Tobacco Use   Smoking status: Former    Packs/day: 1.00    Years: 32.00    Additional pack years: 0.00    Total pack years: 32.00    Types: Cigarettes    Start date: 08/10/1963    Quit date: 01/15/2006    Years since quitting: 16.9   Smokeless tobacco: Never  Substance Use Topics   Alcohol use: No     Current Outpatient Medications:    albuterol (PROVENTIL HFA;VENTOLIN HFA) 108 (90 Base) MCG/ACT inhaler, Inhale 2 puffs into the lungs every 6 (six) hours as needed for wheezing or shortness of breath., Disp: , Rfl:    anastrozole (ARIMIDEX) 1 MG tablet, Take 1 tablet (1 mg total) by mouth daily., Disp: 90 tablet, Rfl: 1   Buprenorphine HCl (BELBUCA) 600 MCG FILM, Place 1 Film  inside cheek every 12 (twelve) hours., Disp: 60 each, Rfl: 2   busPIRone (BUSPAR) 10 MG tablet, TAKE 1 TABLET(10 MG) BY MOUTH THREE TIMES DAILY, Disp: 90 tablet, Rfl: 0   Cholecalciferol (VITAMIN D3) 1000 units CAPS, Take 1 capsule by mouth daily., Disp: , Rfl:    EFFEXOR XR 75 MG 24 hr capsule, TAKE 3 CAPSULES DAILY WITH BREAKFAST, Disp: 270 capsule, Rfl: 1   gabapentin (NEURONTIN) 400 MG capsule, Take 1 capsule (400 mg total) by mouth 3 (three) times daily., Disp: 90 capsule, Rfl: 2   metoprolol succinate (TOPROL-XL) 50 MG 24 hr tablet, Take 50 mg by mouth in the morning., Disp: , Rfl:    Multiple Vitamins-Minerals (EYE VITAMINS & MINERALS PO), Take 1 capsule by mouth in the morning. [employer] Eye Vitamins, Disp: , Rfl:    NIFEdipine (PROCARDIA-XL/NIFEDICAL-XL) 30 MG 24 hr tablet, Take 30 mg by mouth at bedtime., Disp: , Rfl:    omega-3 acid ethyl esters (LOVAZA) 1 g capsule, TAKE 2 CAPSULES BY MOUTH TWO TIMES A DAY, Disp: 360 capsule, Rfl: 1   omeprazole (PRILOSEC) 40 MG capsule, TAKE 1 CAPSULE DAILY, Disp: 90 capsule, Rfl: 3   OXYGEN, Inhale 2-4 L into the lungs continuous. 2 L qd and 4 L qhs, Disp: , Rfl:    Potassium Gluconate 80 MG TABS, Take 1 tablet by mouth daily., Disp: , Rfl:    tiZANidine (ZANAFLEX) 4 MG tablet, Take 1 tablet (4 mg total) by mouth every 8 (eight) hours as needed., Disp: 270 tablet, Rfl: 2   torsemide (DEMADEX) 5 MG tablet, Take 5 mg by mouth every morning., Disp: , Rfl:    umeclidinium-vilanterol (ANORO ELLIPTA) 62.5-25 MCG/ACT AEPB, Inhale 1 puff into the lungs in the morning., Disp: , Rfl:   Allergies  Allergen Reactions   Aczone [Dapsone] Other (See Comments)    Hypoxemia   Atarax [Hydroxyzine]     Hypoxemia    Hydrochlorothiazide     History of hyperparathyroidism    Sulfa Antibiotics Rash   Sulfasalazine Rash    I personally reviewed active problem list, medication list, allergies, family history, social history, health maintenance with the  patient/caregiver today.   ROS  ***  Objective  There were  no vitals filed for this visit.  There is no height or weight on file to calculate BMI.  Physical Exam ***  Recent Results (from the past 2160 hour(s))  CMP (Cancer Center only)     Status: Abnormal   Collection Time: 11/24/22  9:42 AM  Result Value Ref Range   Sodium 138 135 - 145 mmol/L   Potassium 3.9 3.5 - 5.1 mmol/L   Chloride 102 98 - 111 mmol/L   CO2 27 22 - 32 mmol/L   Glucose, Bld 117 (H) 70 - 99 mg/dL    Comment: Glucose reference range applies only to samples taken after fasting for at least 8 hours.   BUN 16 8 - 23 mg/dL   Creatinine 1.19 1.47 - 1.00 mg/dL   Calcium 9.5 8.9 - 82.9 mg/dL   Total Protein 7.9 6.5 - 8.1 g/dL   Albumin 4.2 3.5 - 5.0 g/dL   AST 47 (H) 15 - 41 U/L   ALT 44 0 - 44 U/L   Alkaline Phosphatase 107 38 - 126 U/L   Total Bilirubin 0.4 0.3 - 1.2 mg/dL   GFR, Estimated >56 >21 mL/min    Comment: (NOTE) Calculated using the CKD-EPI Creatinine Equation (2021)    Anion gap 9 5 - 15    Comment: Performed at [facility], [address] [place], [place] [address]  CBC with Differential (Cancer Center Only)     Status: None   Collection Time: 11/24/22  9:42 AM  Result Value Ref Range   WBC Count 9.5 4.0 - 10.5 K/uL   RBC 4.25 3.87 - 5.11 MIL/uL   Hemoglobin 12.2 12.0 - 15.0 g/dL   HCT 78.4 69.6 - 29.5 %   MCV 89.6 80.0 - 100.0 fL   MCH 28.7 26.0 - 34.0 pg   MCHC 32.0 30.0 - 36.0 g/dL   RDW 28.4 13.2 - 44.0 %   Platelet Count 257 150 - 400 K/uL   nRBC 0.0 0.0 - 0.2 %   Neutrophils Relative % 71 %   Neutro Abs 6.8 1.7 - 7.7 K/uL   Lymphocytes Relative 17 %   Lymphs Abs 1.6 0.7 - 4.0 K/uL   Monocytes Relative 7 %   Monocytes Absolute 0.7 0.1 - 1.0 K/uL   Eosinophils Relative 4 %   Eosinophils Absolute 0.4 0.0 - 0.5 K/uL   Basophils Relative 1 %   Basophils Absolute 0.1 0.0 - 0.1 K/uL   Immature Granulocytes 0 %   Abs Immature Granulocytes 0.03 0.00 - 0.07 K/uL     Comment: Performed at [facility], [address] [place], [place] [address]    PHQ2/9:    09/13/2022    1:09 PM 08/30/2022   11:21 AM 08/30/2022    7:56 AM 08/03/2022    2:41 PM 06/10/2022   10:12 AM  Depression screen PHQ 2/9  Decreased Interest 0 1 0 0 2  Down, Depressed, Hopeless 0 1 0 0 3  PHQ - 2 Score 0 2 0 0 5  Altered sleeping  3 0 0 0  Tired, decreased energy  3 1 1 2   Change in appetite  0 0 1 2  Feeling bad or failure about yourself   0 0 0 1  Trouble concentrating  0 0 0 1  Moving slowly or fidgety/restless  0 0 2 1  Suicidal thoughts  0 0 0 0  PHQ-9 Score  8 1 4 12   Difficult doing work/chores  Somewhat difficult Not  difficult at all Not difficult at all Somewhat difficult    phq 9 is {gen pos [id number]}   Fall Risk:    09/13/2022    1:09 PM 08/30/2022   11:21 AM 08/30/2022    7:54 AM 08/03/2022    2:41 PM 06/15/2022    1:11 PM  Fall Risk   Falls in the past year? 0 0 0 0 0  Number falls in past yr:   0  0  Injury with Fall?   0    Risk for fall due to :  No Fall Risks  No Fall Risks No Fall Risks  Follow up  Falls prevention discussed;Education provided;Falls evaluation completed Falls evaluation completed Falls prevention discussed;Education provided;Falls evaluation completed Falls evaluation completed      Functional Status Survey:      Assessment & Plan  *** There are no diagnoses linked to this encounter.

## 2022-12-07 ENCOUNTER — Ambulatory Visit: Payer: Medicare HMO | Admitting: Family Medicine

## 2022-12-08 DIAGNOSIS — I270 Primary pulmonary hypertension: Secondary | ICD-10-CM | POA: Diagnosis not present

## 2022-12-08 DIAGNOSIS — G4733 Obstructive sleep apnea (adult) (pediatric): Secondary | ICD-10-CM | POA: Diagnosis not present

## 2022-12-08 DIAGNOSIS — R0902 Hypoxemia: Secondary | ICD-10-CM | POA: Diagnosis not present

## 2022-12-08 DIAGNOSIS — C211 Malignant neoplasm of anal canal: Secondary | ICD-10-CM | POA: Diagnosis not present

## 2022-12-08 DIAGNOSIS — J432 Centrilobular emphysema: Secondary | ICD-10-CM | POA: Diagnosis not present

## 2022-12-09 ENCOUNTER — Encounter: Payer: Medicare HMO | Admitting: Student in an Organized Health Care Education/Training Program

## 2022-12-09 ENCOUNTER — Ambulatory Visit
Payer: Medicare HMO | Attending: Student in an Organized Health Care Education/Training Program | Admitting: Student in an Organized Health Care Education/Training Program

## 2022-12-09 ENCOUNTER — Telehealth: Payer: Self-pay | Admitting: *Deleted

## 2022-12-09 ENCOUNTER — Encounter: Payer: Self-pay | Admitting: Student in an Organized Health Care Education/Training Program

## 2022-12-09 VITALS — BP 131/78 | HR 79 | Temp 97.7°F | Resp 18 | Ht 63.5 in | Wt 180.0 lb

## 2022-12-09 DIAGNOSIS — M533 Sacrococcygeal disorders, not elsewhere classified: Secondary | ICD-10-CM | POA: Diagnosis not present

## 2022-12-09 DIAGNOSIS — M5416 Radiculopathy, lumbar region: Secondary | ICD-10-CM | POA: Insufficient documentation

## 2022-12-09 DIAGNOSIS — M5412 Radiculopathy, cervical region: Secondary | ICD-10-CM

## 2022-12-09 DIAGNOSIS — M7062 Trochanteric bursitis, left hip: Secondary | ICD-10-CM | POA: Insufficient documentation

## 2022-12-09 DIAGNOSIS — G894 Chronic pain syndrome: Secondary | ICD-10-CM

## 2022-12-09 DIAGNOSIS — G8929 Other chronic pain: Secondary | ICD-10-CM | POA: Diagnosis not present

## 2022-12-09 DIAGNOSIS — M7061 Trochanteric bursitis, right hip: Secondary | ICD-10-CM | POA: Diagnosis not present

## 2022-12-09 DIAGNOSIS — M542 Cervicalgia: Secondary | ICD-10-CM | POA: Diagnosis not present

## 2022-12-09 MED ORDER — HYDROCODONE-ACETAMINOPHEN 10-325 MG PO TABS: 1.0000 | ORAL_TABLET | Freq: Three times a day (TID) | ORAL | 0 refills | Status: AC | PRN

## 2022-12-09 MED ORDER — METHOCARBAMOL 500 MG PO TABS
500.0000 mg | ORAL_TABLET | Freq: Three times a day (TID) | ORAL | 0 refills | Status: DC | PRN
Start: 2022-12-09 — End: 2022-12-09

## 2022-12-09 MED ORDER — GABAPENTIN 400 MG PO CAPS
400.0000 mg | ORAL_CAPSULE | Freq: Three times a day (TID) | ORAL | 5 refills | Status: DC
Start: 2022-12-09 — End: 2023-05-31

## 2022-12-09 MED ORDER — HYDROCODONE-ACETAMINOPHEN 10-325 MG PO TABS: 1.0000 | ORAL_TABLET | Freq: Three times a day (TID) | ORAL | 0 refills | Status: DC | PRN

## 2022-12-09 MED ORDER — CYCLOBENZAPRINE HCL 5 MG PO TABS
5.0000 mg | ORAL_TABLET | Freq: Three times a day (TID) | ORAL | 0 refills | Status: DC | PRN
Start: 2022-12-09 — End: 2023-01-31

## 2022-12-09 NOTE — Progress Notes (Signed)
Safety precautions to be maintained throughout the outpatient stay will include: orient to surroundings, keep bed in low position, maintain call bell within reach at all times, provide assistance with transfer out of bed and ambulation.   Nursing Pain Medication Assessment:  Safety precautions to be maintained throughout the outpatient stay will include: orient to surroundings, keep bed in low position, maintain call bell within reach at all times, provide assistance with transfer out of bed and ambulation.  Medication Inspection Compliance: Pill count conducted under aseptic conditions, in front of the patient. Neither the pills nor the bottle was removed from the patient's sight at any time. Once count was completed pills were immediately returned to the patient in their original bottle.  Medication #1: Hydrocodone/APAP Pill/Patch Count:  0 of 30 pills remain Pill/Patch Appearance: Markings consistent with prescribed medication Bottle Appearance: Standard pharmacy container. Clearly labeled. Filled Date: 02 / 06 / 2024 Last Medication intake:  Ran out of medicine more than 48 hours ago (1 month ago)  Medication #2: Buprenorphine (Suboxone) Pill/Patch Count:  28 of 60 pills remain Pill/Patch Appearance: Markings consistent with prescribed medication Bottle Appearance: Standard pharmacy container. Clearly labeled. Filled Date: 04 / 12 / 2024 Last Medication intake:  Today

## 2022-12-09 NOTE — Progress Notes (Signed)
PROVIDER NOTE: Information contained herein reflects review and annotations entered in association with encounter. Interpretation of such information and data should be left to medically-trained personnel. Information provided to patient can be located elsewhere in the medical record under "Patient Instructions". Document created using STT-dictation technology, any transcriptional errors that may result from process are unintentional.    Patient: Judy Allen  Service Category: E/M  Provider: Edward Jolly, MD  DOB: May 06, 1956  DOS: 12/09/2022  Referring Provider: Alba Cory, MD  MRN: [id number]  Specialty: Interventional Pain Management  PCP: Alba Cory, MD  Type: Established Patient  Setting: Ambulatory outpatient    Location: Office  Delivery: Face-to-face     HPI  Judy Allen, a 67 y.o. year old female, is here today because of her Chronic pain syndrome [G89.4]. Judy Allen primary complain today is Neck Pain Last encounter: My last encounter with her was on 09/13/22 Pertinent problems: Judy Allen has Chronic recurrent major depressive disorder (HCC); GAD (generalized anxiety disorder); Myofascial pain syndrome, cervical; Chronic pain syndrome; Cervical facet joint syndrome; Cervical radiculopathy (left); and Osteoarthritis of spine with radiculopathy, cervical region on their pertinent problem list. Pain Assessment: Severity of Chronic pain is reported as a 6 /10. Location: Neck Posterior, Left/Radaites from back of left neck into left arm. Onset: More than a month ago. Quality: Constant, Tightness, Numbness. Timing: Constant. Modifying factor(s): Pain medicine sometimes. Vitals:  height is 5' 3.5" (1.613 m) and weight is 180 lb (81.6 kg). Her temporal temperature is 97.7 F (36.5 C). Her blood pressure is 131/78 and her pulse is 79. Her respiration is 18 and oxygen saturation is 93%.   Reason for encounter: medication management.  Patient would like to discontinue her belbuca as  she states that it is not helping and she is having trouble with application of film to buccal mucosa.  She would like to continue on hydrocodone but is requesting a slight dose increase since we are stopping her belbuca. She also would like to trial another muscle relaxant as she states that the tizanidine is less effective and is resulting in sedation. Renew urine toxicology screen today for medication compliance and monitoring Continue gabapentin as prescribed 300 mg 3 times daily   Pharmacotherapy Assessment  Analgesic: Hydrocodone 10 mg 3 times daily as needed  Monitoring: [place] PMP: PDMP reviewed during this encounter.       Pharmacotherapy: No side-effects or adverse reactions reported. Compliance: No problems identified. Effectiveness: Clinically acceptable.  Earlyne Iba, RN  12/09/2022  2:01 PM  Sign when Signing Visit Safety precautions to be maintained throughout the outpatient stay will include: orient to surroundings, keep bed in low position, maintain call bell within reach at all times, provide assistance with transfer out of bed and ambulation.   Nursing Pain Medication Assessment:  Safety precautions to be maintained throughout the outpatient stay will include: orient to surroundings, keep bed in low position, maintain call bell within reach at all times, provide assistance with transfer out of bed and ambulation.  Medication Inspection Compliance: Pill count conducted under aseptic conditions, in front of the patient. Neither the pills nor the bottle was removed from the patient's sight at any time. Once count was completed pills were immediately returned to the patient in their original bottle.  Medication #1: Hydrocodone/APAP Pill/Patch Count:  0 of 30 pills remain Pill/Patch Appearance: Markings consistent with prescribed medication Bottle Appearance: Standard pharmacy container. Clearly labeled. Filled Date: 02 / 06 / 2024 Last Medication intake:  Ran  out of medicine  more than 48 hours ago (1 month ago)  Medication #2: Buprenorphine (Suboxone) Pill/Patch Count:  28 of 60 pills remain Pill/Patch Appearance: Markings consistent with prescribed medication Bottle Appearance: Standard pharmacy container. Clearly labeled. Filled Date: 04 / 12 / 2024 Last Medication intake:  Today    No results found for: "CBDTHCR" No results found for: "D8THCCBX" No results found for: "D9THCCBX"  UDS:  Summary  Date Value Ref Range Status  11/03/2021 Note  Final    Comment:    ==================================================================== [employer] Select 13 (MW) ==================================================================== Test                             Result       Flag       Units  Drug Present and Declared for Prescription Verification   Tramadol                       >6098        EXPECTED   ng/mg creat   O-Desmethyltramadol            >6098        EXPECTED   ng/mg creat   N-Desmethyltramadol            3405         EXPECTED   ng/mg creat    Source of tramadol is a prescription medication. O-desmethyltramadol    and N-desmethyltramadol are expected metabolites of tramadol.  ==================================================================== Test                      Result    Flag   Units      Ref Range   Creatinine              82               mg/dL      >=16 ==================================================================== Declared Medications:  The flagging and interpretation on this report are based on the  following declared medications.  Unexpected results may arise from  inaccuracies in the declared medications.   **Note: The testing scope of this panel includes these medications:   Tramadol (Ultram)   **Note: The testing scope of this panel does not include the  following reported medications:   Albuterol (Ventolin HFA)  Fish Oil  Gabapentin (Neurontin)  Gemfibrozil (Lopid)  Hydrocortisone  Influenza Virus Vaccine  (Fluzone)  Metoprolol (Toprol)  Omeprazole (Prilosec)  Telmisartan (Micardis)  Tizanidine (Zanaflex)  Torsemide (Demadex)  Umeclidinium (Anoro)  Venlafaxine (Effexor)  Vilanterol (Anoro)  Vitamin D2 (Drisdol) ==================================================================== For clinical consultation, please call [phone]. ====================================================================       ROS  Constitutional: Denies any fever or chills Gastrointestinal: No reported hemesis, hematochezia, vomiting, or acute GI distress Musculoskeletal:  Cervicalgia Neurological: No reported episodes of acute onset apraxia, aphasia, dysarthria, agnosia, amnesia, paralysis, loss of coordination, or loss of consciousness  Medication Review  HYDROcodone-acetaminophen, Multiple Vitamins-Minerals, NIFEdipine, Oxygen-Helium, Potassium Gluconate, Vitamin D3, albuterol, anastrozole, busPIRone, cyclobenzaprine, gabapentin, metoprolol succinate, omega-3 acid ethyl esters, omeprazole, torsemide, umeclidinium-vilanterol, and venlafaxine XR  History Review  Allergy: Judy Allen is allergic to aczone [dapsone], atarax [hydroxyzine], hydrochlorothiazide, sulfa antibiotics, and sulfasalazine. Drug: Judy Allen  reports no history of drug use. Alcohol:  reports no history of alcohol use. Tobacco:  reports that she quit smoking about 16 years ago. Her smoking use included cigarettes. She started smoking about 59 years ago.  She has a 32.00 pack-year smoking history. She has never used smokeless tobacco. Social: Judy Allen  reports that she quit smoking about 16 years ago. Her smoking use included cigarettes. She started smoking about 59 years ago. She has a 32.00 pack-year smoking history. She has never used smokeless tobacco. She reports that she does not drink alcohol and does not use drugs. Medical:  has a past medical history of Anal squamous cell carcinoma (HCC) (03/10/2016), Anemia, Anxiety, Aortic  atherosclerosis (HCC), Arthritis, Breast cancer, left (HCC) (07/10/2022), Complication of anesthesia, Constipation, COPD (chronic obstructive pulmonary disease) (HCC), Coronary artery calcification seen on CT scan, Depression, Diastolic dysfunction (06/08/2021), Emphysema of lung (HCC), GERD (gastroesophageal reflux disease), Goals of care, counseling/discussion (07/10/2022), H/O allergic rhinitis, Heart murmur, Hemorrhoid, History of fusion of cervical spine, History of kidney stones, Hyperlipidemia, Hyperparathyroidism (HCC), Hypertension, Macular degeneration, 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 Surgical: Judy Allen  has a past surgical history that includes Tubal ligation; Anterior fusion lumbar spine (1990); Mass excision (Left, 02/23/2016); Colonoscopy (2018); Portacath placement (Left, 03/12/2016); Cardiac catheterization (2009? ); Cardiac catheterization (05/2012); Port-a-cath removal (2018); Liver biopsy (N/A, 09/12/2017); Cholecystectomy (N/A, 09/12/2017); Trigger finger release (Right, 09/2018); Anterior cervical decomp/discectomy fusion (N/A, 2001); Tonsillectomy (Bilateral, 1959); Anterior and posterior repair (N/A, 12/28/2021); Vaginal hysterectomy (N/A, 12/28/2021); Breast biopsy (Left, 07/05/2022); Breast biopsy (Left, 07/05/2022); Breast biopsy (Left, 07/22/2022); and Part mastectomy,radio frequency localizer,axillary sentinel node biopsy (Left, 07/26/2022). Family: family history includes Asthma in her mother; Breast cancer in her cousin and maternal aunt; Cancer in her brother; Heart attack in her mother; Liver  cancer (age of onset: 16) in her father.  Laboratory Chemistry Profile   Renal Lab Results  Component Value Date   BUN 16 11/24/2022   CREATININE 0.87 11/24/2022   LABCREA 40 05/14/2022   BCR SEE NOTE: 08/03/2022   GFRAA 75 04/23/2019   GFRNONAA >60 11/24/2022    Hepatic Lab Results  Component Value Date   AST 47 (H) 11/24/2022   ALT 44 11/24/2022   ALBUMIN 4.2 11/24/2022   ALKPHOS 107 11/24/2022   LIPASE 27 08/03/2017    Electrolytes Lab Results  Component Value Date   NA 138 11/24/2022   K 3.9 11/24/2022   CL 102 11/24/2022   CALCIUM 9.5 11/24/2022    Bone Lab Results  Component Value Date   VD25OH 57 07/22/2021    Inflammation (CRP: Acute Phase) (ESR: Chronic Phase) No results found for: "CRP", "ESRSEDRATE", "LATICACIDVEN"       Note: Above Lab results reviewed.  Recent Imaging Review  DG Bone Density EXAM: DUAL X-RAY ABSORPTIOMETRY (DXA) FOR BONE MINERAL DENSITY  IMPRESSION: Dear Rickard Patience,  Your patient TAYRA DAWE completed a FRAX assessment on 11/25/2022 using the [facility] [employer] iDXA DXA System (analysis version: 14.10) manufactured by [employer]. The following summarizes the results of our evaluation.  PATIENT BIOGRAPHICAL: Name: Shawana, Knoch Patient ID: [id number] Birth Date: 1955-09-17 Height:    64.0 in. Gender:     Female    [age]       Weight:    180.9 lbs. Ethnicity:  White  Exam Date: 11/25/2022  FRAX* RESULTS:  (version: 3.5) 10-year Probability of Fracture1 Major Osteoporotic Fracture2 Hip Fracture 9.1% 1.1% Population: [place] (Caucasian) Risk Factors: None  Based on Femur (Left) Neck BMD  1 -The 10-year probability of fracture may be lower than reported if the patient has received treatment. 2 -Major Osteoporotic Fracture: Clinical Spine, Forearm, Hip or Shoulder  *FRAX is a Armed forces logistics/support/administrative officer of the [employer] of [employer] for Metabolic Bone Disease, a World Environmental consultant (WHO) [employer].  ASSESSMENT: The probability of a major osteoporotic fracture is 9.1% within the next ten years.  The probability of a hip fracture is 1.1% within the next ten years. .  Your patient Elysia Grand completed a BMD test on 11/25/2022 using the [employer] DXA System (software version: 14.10) manufactured by [employer]. The following summarizes the results of our evaluation. Technologist: MTB PATIENT BIOGRAPHICAL: Name: Tonianne, Fine Patient ID: [id number] Birth Date: 13-May-1956 Height: 64.0 in. Gender: Female Exam Date: 11/25/2022 Weight: 180.9 lbs. Indications: Caucasian, COPD, History of Breast Cancer, History of Spinal Surgery, Hysterectomy, Oophorectomy Bilateral, Postmenopausal Fractures: Treatments: Anastrozole, Vitamin D DENSITOMETRY RESULTS: Site         Region     Measured Date Measured Age WHO Classification Young Adult T-score BMD         %Change vs. Previous Significant Change (*) DualFemur Neck Left 11/25/2021 67.6 Osteopenia -1.5 0.833 g/cm2 - -  DualFemur Total Mean 11/25/2022 67.6 Normal -1.0 0.883 g/cm2 - -  Left Forearm Radius 33% 11/25/2022 67.6 Normal 0.2 0.894 g/cm2 - - ASSESSMENT: The BMD measured at Femur Neck Left is 0.833 g/cm2 with a T-score of -1.5. This patient is considered osteopenic according to World Health Organization [facility]) criteria. The scan quality is good. Lumbar spine was not utilized due to advanced degenerative changes/scoliosis.  World Science writer [facility]) criteria for post-menopausal, Caucasian Women: Normal:                   T-score at or above -1 SD Osteopenia/low bone mass: T-score between -1 and -2.5 SD Osteoporosis:             T-score at or below -2.5 SD  RECOMMENDATIONS: 1. All patients should optimize calcium and vitamin D intake. 2. Consider FDA-approved medical therapies in postmenopausal women and men aged 95 years and older, based on the following: a. A  hip or vertebral(clinical or morphometric) fracture b. T-score < -2.5 at the femoral neck or spine after appropriate evaluation to exclude secondary causes c. Low bone mass (T-score between -1.0 and -2.5 at the femoral neck or spine) and a 10-year probability of a hip fracture > 3% or a 10-year probability of a major osteoporosis-related fracture > 20% based on the [place]-adapted WHO algorithm 3. Clinician judgment and/or patient preferences may indicate treatment for people with 10-year fracture probabilities above or below these levels FOLLOW-UP: People with diagnosed cases of osteoporosis or at high risk for fracture should have regular bone mineral density tests. For patients eligible for Medicare, routine testing is allowed once every 2 years. The testing frequency can be increased to one year for patients who have rapidly progressing disease, those who are receiving or discontinuing medical therapy to restore bone mass, or have additional risk factors. I have reviewed this report, and agree with the above findings.  [facility]  Electronically Signed   By: Romona Curls M.D.   On: 11/25/2022 15:59 Note: Reviewed  Physical Exam  General appearance: Well nourished, well developed, and well hydrated. In no apparent acute distress Mental status: Alert, oriented x 3 (person, place, & time)       Respiratory: No evidence of acute respiratory distress on home oxygen 2 L Eyes: PERLA Vitals: BP 131/78   Pulse 79   Temp 97.7 F (36.5 C) (Temporal)   Resp 18   Ht 5' 3.5" (1.613 m)   Wt 180 lb (81.6 kg)   LMP 03/04/2002 (Approximate)   SpO2 93% Comment: 2 L of O2  BMI 31.39 kg/m  BMI: Estimated body mass index is 31.39 kg/m as calculated from the following:   Height as of this encounter: 5' 3.5" (1.613 m).   Weight as of this encounter: 180 lb (81.6 kg). Ideal: Ideal body weight: 53.5 kg (118 lb 0.9 oz) Adjusted ideal body weight: 64.8 kg (142 lb 13.3  oz)  Cervicalgia with pain radiation into bilateral upper extremity  5 out of 5 strength bilateral lower extremity: Plantar flexion, dorsiflexion, knee flexion, knee extension.   Assessment   Diagnosis Status  1. Chronic pain syndrome   2. Cervical radiculopathy (left)   3. Lumbar radiculopathy   4. Chronic cervical pain   5. Chronic radicular lumbar pain   6. Disorder of SI (sacroiliac) joint   7. Trochanteric bursitis of both hips     Persistent Persistent Persistent     Plan of Care  Problem-specific:  No problem-specific Assessment & Plan notes found for this encounter.  Judy Allen has a current medication list which includes the following long-term medication(s): effexor xr, gabapentin, omega-3 acid ethyl esters, omeprazole, and potassium gluconate.  Pharmacotherapy (Medications Ordered): Meds ordered this encounter  Medications   HYDROcodone-acetaminophen (NORCO) 10-325 MG tablet    Sig: Take 1 tablet by mouth every 8 (eight) hours as needed for severe pain. Must last 30 days.    Dispense:  90 tablet    Refill:  0    Chronic Pain: STOP Act (Not applicable) Fill 1 day early if closed on refill date. Avoid benzodiazepines within 8 hours of opioids   HYDROcodone-acetaminophen (NORCO) 10-325 MG tablet    Sig: Take 1 tablet by mouth every 8 (eight) hours as needed for severe pain. Must last 30 days.    Dispense:  90 tablet    Refill:  0    Chronic Pain: STOP Act (Not applicable) Fill 1 day early if closed on refill date. Avoid benzodiazepines within 8 hours of opioids   HYDROcodone-acetaminophen (NORCO) 10-325 MG tablet    Sig: Take 1 tablet by mouth every 8 (eight) hours as needed for severe pain. Must last 30 days.    Dispense:  90 tablet    Refill:  0    Chronic Pain: STOP Act (Not applicable) Fill 1 day early if closed on refill date. Avoid benzodiazepines within 8 hours of opioids   gabapentin (NEURONTIN) 400 MG capsule    Sig: Take 1 capsule (400 mg  total) by mouth 3 (three) times daily.    Dispense:  90 capsule    Refill:  5   DISCONTD: methocarbamol (ROBAXIN) 500 MG tablet    Sig: Take 1 tablet (500 mg total) by mouth every 8 (eight) hours as needed for muscle spasms.    Dispense:  90 tablet    Refill:  0    Do not place this medication, or any other prescription from our practice, on "Automatic Refill". Patient may have prescription filled one  day early if pharmacy is closed on scheduled refill date.   cyclobenzaprine (FLEXERIL) 5 MG tablet    Sig: Take 1 tablet (5 mg total) by mouth 3 (three) times daily as needed for muscle spasms.    Dispense:  90 tablet    Refill:  0    Do not place this medication, or any other prescription from our practice, on "Automatic Refill". Patient may have prescription filled one day early if pharmacy is closed on scheduled refill date.    Follow-up plan:   Return in about 3 months (around 03/11/2023) for Medication Management, in person.    Recent Visits Date Type Provider Dept  09/13/22 Office Visit Edward Jolly, MD [facility]  Showing recent visits within past 90 days and meeting all other requirements Today's Visits Date Type Provider Dept  12/09/22 Office Visit Edward Jolly, MD [facility]  Showing today's visits and meeting all other requirements Future Appointments No visits were found meeting these conditions. Showing future appointments within next 90 days and meeting all other requirements  I discussed the assessment and treatment plan with the patient. The patient was provided an opportunity to ask questions and all were answered. The patient agreed with the plan and demonstrated an understanding of the instructions.  Patient advised to call back or seek an in-person evaluation if the symptoms or condition worsens.  Duration of encounter: .  Total time on encounter, as per AMA guidelines included both the face-to-face and non-face-to-face time  personally spent by the physician and/or other qualified health care professional(s) on the day of the encounter (includes time in activities that require the physician or other qualified health care professional and does not include time in activities normally performed by clinical staff). Physician's time may include the following activities when performed: preparing to see the patient (eg, review of tests, pre-charting review of records) obtaining and/or reviewing separately obtained history performing a medically appropriate examination and/or evaluation counseling and educating the patient/family/caregiver ordering medications, tests, or procedures referring and communicating with other health care professionals (when not separately reported) documenting clinical information in the electronic or other health record independently interpreting results (not separately reported) and communicating results to the patient/ family/caregiver care coordination (not separately reported)  Note by: Edward Jolly, MD Date: 12/09/2022; Time: 3:16 PM

## 2022-12-09 NOTE — Telephone Encounter (Signed)
Received Fx from pharmacy stating that patient's insurance will not cover Robaxin and have listed alternatives.  BL has sent in flexeril 5 mg tid prn.  Qty 90.  Called patient and let her know.

## 2022-12-21 NOTE — Progress Notes (Deleted)
Name: Judy Allen   MRN: [id number]    DOB: 04-15-1956   Date:12/21/2022       Progress Note  Subjective  Chief Complaint  Follow Up  HPI  History of anal cancer: patient was diagnosed with anal cancer in 2017, biopsy done by Dr. Evette Cristal and treated with radiation at [facility].Doing well now  Breast cancer - left side: found by abnormal mammogram and biopsy positive 07/05/2022 , invasive mammary carcinoma. She will go to [facility] for radiation therapy   MDD: long history of depression, currently on Effexor 225 mg daily , she used to take abilify but it caused tremors.after that we tried Vraylar but also caused tremors once she went up on the dose. She is currently on ly Effexor and buspar, feeling more anxious lately ( recent diagnosis of breast cancer) explained we can try increasing frequency of buspar to 3 times daily and if needed go up on the dose to 10 mg TID.  She does not want to see a psychiatrist . She went to [facility] in the past but does not want to go back at this time.   Hypertriglyceridemiareviewed last labs done in our office was 07/2021 and LDL was 70 . Currently taking Crestor , Lovaza and Gemfibrozil , denies side effects.We will recheck labs    HTN/Paroxysmal tachycardia: . She is currently taking Norvasc 2.5 , Telmisartan 40 Metoprolol 50mg  , she is under the care of Dr. Austin Miles - nephrologist, last visit Oct 2023. She has good urine output, denies any pruritus    Hyperparathyroidism: diagnosed years ago and used to see Dr. Tedd Sias. She has been off HCTZ and last parathyroid test and vitamin D back to normal and we will not recheck it today    Emphysema and hypoxemia: on oxygen 2  liters during the day and 4 liters when sleeping . She continues to have sob is stable and only with activity, denies current cough but has noticed mild wheezing since yesterday  . Under the care of pulmonologist at [facility].    DMII: diagnosed 02/2018 by Dr. Tedd Sias, hgbA1C was at 6.6%, A1C  but has been off Metformin  and A1C has been within normal limits, down to 6.2 %  % today.  She has diabetes neuropathy - pain on both feet . She also has dyslipidemia.   Continue Gabapentin for foot pain .    Chronic pain: under the care of Dr. Cherylann Ratel, she states she is doing well on Gabapentin , recently starting on new pain management - of Buprenorphine film , also taking muscle relaxer prn .She has a history of laminectomy. She has neck and back pain, pain level right now is 2/10   Senile purpura: she states not as bad now, usually on arms Unchanged and reassurance given   Psoriasis: she is doing well, no current rashes .She lost to follow up and is out of creams and injections . She asked for topical medication, advised to contact hime.   Patient Active Problem List   Diagnosis Date Noted   Aromatase inhibitor use 11/24/2022   Macular degeneration 08/30/2022   Genetic testing 08/20/2022   Breast cancer (HCC) 07/26/2022   Breast cancer, left (HCC) 07/10/2022   Invasive carcinoma of breast (HCC) 07/10/2022   Goals of care, counseling/discussion 07/10/2022   Cystocele with rectocele 12/28/2021   S/P vaginal hysterectomy 12/28/2021   Bilateral hip pain 11/03/2021   Hypotension due to drugs 05/03/2021   Disorder of SI (sacroiliac) joint 11/11/2020   Arthritis  of sacroiliac joint of both sides 11/11/2020   Chronic radicular lumbar pain 11/11/2020   Hyperparathyroidism (HCC) 08/25/2019   Diabetes mellitus type 2 in obese 11/06/2018   Cervical radiculopathy (left) 08/31/2018   Osteoarthritis of spine with radiculopathy, cervical region 08/31/2018   History of anal cancer 05/30/2018   Myofascial pain syndrome, cervical 05/16/2018   Chronic pain syndrome 05/16/2018   Cervical facet joint syndrome 05/16/2018   Perihepatitis (HCC) 09/27/2017   Fatty liver 12/22/2016   Chronic respiratory failure with hypoxia, on home oxygen therapy (HCC) 07/28/2016   Centrilobular emphysema (HCC) 07/14/2016   Abnormal Pap smear of  cervix 04/14/2016   Anal squamous cell carcinoma (HCC) 03/09/2016   Non-thrombocytopenic purpura (HCC) 01/12/2016   Chronic constipation 10/16/2015   History of fusion of cervical spine 04/02/2015   Benign hypertension 01/16/2015   Chronic cervical pain 01/16/2015   CN (constipation) 01/16/2015   Gastric reflux 01/16/2015   Hypertriglyceridemia 01/16/2015   Edema leg 01/16/2015   Chronic recurrent major depressive disorder (HCC) 01/16/2015   Dysmetabolic syndrome 01/16/2015   Obstructive apnea 01/16/2015   Psoriasis 01/16/2015   Allergic rhinitis 01/16/2015   Bursitis, trochanteric 01/16/2015   GAD (generalized anxiety disorder) 01/16/2015   Tachycardia, paroxysmal (HCC)     Past Surgical History:  Procedure Laterality Date   ANTERIOR AND POSTERIOR REPAIR N/A 12/28/2021   Procedure: ANTERIOR (CYSTOCELE) AND POSTERIOR REPAIR (RECTOCELE);  Surgeon: Hildred Laser, MD;  Location: [facility];  Service: Gynecology;  Laterality: N/A;   ANTERIOR CERVICAL DECOMP/DISCECTOMY FUSION N/A 2001   PROCEDURE: ACDF C4-C7   ANTERIOR FUSION LUMBAR SPINE  1990   plate in back, not metal   BREAST BIOPSY Left 07/05/2022   [place] Bx, Ribbon Clip, Path Pending   BREAST BIOPSY Left 07/05/2022   [place] LT BREAST BX W LOC DEV 1ST LESION IMG BX SPEC US GUIDE 07/05/2022 [facility]   BREAST BIOPSY Left 07/22/2022   [place] LT RADIO FREQUENCY TAG LOC US GUIDE 07/22/2022 [facility]   CARDIAC CATHETERIZATION  2009?    [facility]   CARDIAC CATHETERIZATION  05/2012   [facility]: Mild hypertension 37/12 with a mean pressure of 21 mm mercury   CHOLECYSTECTOMY N/A 09/12/2017   Procedure: LAPAROSCOPIC CHOLECYSTECTOMY WITH INTRAOPERATIVE CHOLANGIOGRAM;  Surgeon: Earline Mayotte, MD;  Location: [facility];  Service: General;  Laterality: N/A;   COLONOSCOPY  2018   LIVER BIOPSY N/A 09/12/2017   Procedure: LIVER BIOPSY;  Surgeon: Earline Mayotte, MD;  Location: [facility];  Service: General;  Laterality: N/A;   MASS EXCISION  Left 02/23/2016   Procedure: EXCISION MASS;  Surgeon: Kieth Brightly, MD;  Location: [facility];  Service: General;  Laterality: Left;   PART MASTECTOMY,RADIO FREQUENCY LOCALIZER,AXILLARY SENTINEL NODE BIOPSY Left 07/26/2022   Procedure: PART MASTECTOMY,RADIO FREQUENCY LOCALIZER,AXILLARY SENTINEL NODE BIOPSY;  Surgeon: Carolan Shiver, MD;  Location: [facility];  Service: General;  Laterality: Left;   PORT-A-CATH REMOVAL  2018   PORTACATH PLACEMENT Left 03/12/2016   Procedure: INSERTION PORT-A-CATH;  Surgeon: Kieth Brightly, MD;  Location: [facility];  Service: General;  Laterality: Left;   TONSILLECTOMY Bilateral 1959   TRIGGER FINGER RELEASE Right 09/2018   Dr. Hyacinth Meeker    TUBAL LIGATION     s/p   VAGINAL HYSTERECTOMY N/A 12/28/2021   Procedure: HYSTERECTOMY VAGINAL;  Surgeon: Hildred Laser, MD;  Location: [facility];  Service: Gynecology;  Laterality: N/A;    Family History  Problem Relation Age of Onset   Heart attack Mother    Asthma Mother  Liver cancer Father 3   Cancer Brother        unk type; metastatic   Breast cancer Maternal Aunt        dx 20s-30s   Breast cancer Cousin        dx 69s   Bladder Cancer Neg Hx    Kidney cancer Neg Hx    Colon cancer Neg Hx     Social History   Tobacco Use   Smoking status: Former    Packs/day: 1.00    Years: 32.00    Additional pack years: 0.00    Total pack years: 32.00    Types: Cigarettes    Start date: 08/10/1963    Quit date: 01/15/2006    Years since quitting: 16.9   Smokeless tobacco: Never  Substance Use Topics   Alcohol use: No     Current Outpatient Medications:    albuterol (PROVENTIL HFA;VENTOLIN HFA) 108 (90 Base) MCG/ACT inhaler, Inhale 2 puffs into the lungs every 6 (six) hours as needed for wheezing or shortness of breath., Disp: , Rfl:    anastrozole (ARIMIDEX) 1 MG tablet, Take 1 tablet (1 mg total) by mouth daily., Disp: 90 tablet, Rfl: 1   busPIRone (BUSPAR) 10 MG tablet, TAKE 1 TABLET(10 MG)  BY MOUTH THREE TIMES DAILY, Disp: 90 tablet, Rfl: 0   Cholecalciferol (VITAMIN D3) 1000 units CAPS, Take 1 capsule by mouth daily., Disp: , Rfl:    cyclobenzaprine (FLEXERIL) 5 MG tablet, Take 1 tablet (5 mg total) by mouth 3 (three) times daily as needed for muscle spasms., Disp: 90 tablet, Rfl: 0   EFFEXOR XR 75 MG 24 hr capsule, TAKE 3 CAPSULES DAILY WITH BREAKFAST, Disp: 270 capsule, Rfl: 1   gabapentin (NEURONTIN) 400 MG capsule, Take 1 capsule (400 mg total) by mouth 3 (three) times daily., Disp: 90 capsule, Rfl: 5   HYDROcodone-acetaminophen (NORCO) 10-325 MG tablet, Take 1 tablet by mouth every 8 (eight) hours as needed for severe pain. Must last 30 days., Disp: 90 tablet, Rfl: 0   [START ON 01/08/2023] HYDROcodone-acetaminophen (NORCO) 10-325 MG tablet, Take 1 tablet by mouth every 8 (eight) hours as needed for severe pain. Must last 30 days., Disp: 90 tablet, Rfl: 0   [START ON 02/07/2023] HYDROcodone-acetaminophen (NORCO) 10-325 MG tablet, Take 1 tablet by mouth every 8 (eight) hours as needed for severe pain. Must last 30 days., Disp: 90 tablet, Rfl: 0   metoprolol succinate (TOPROL-XL) 50 MG 24 hr tablet, Take 50 mg by mouth in the morning., Disp: , Rfl:    Multiple Vitamins-Minerals (EYE VITAMINS & MINERALS PO), Take 1 capsule by mouth in the morning. [employer] Eye Vitamins, Disp: , Rfl:    NIFEdipine (PROCARDIA-XL/NIFEDICAL-XL) 30 MG 24 hr tablet, Take 30 mg by mouth at bedtime., Disp: , Rfl:    omega-3 acid ethyl esters (LOVAZA) 1 g capsule, TAKE 2 CAPSULES BY MOUTH TWO TIMES A DAY, Disp: 360 capsule, Rfl: 1   omeprazole (PRILOSEC) 40 MG capsule, TAKE 1 CAPSULE DAILY, Disp: 90 capsule, Rfl: 3   OXYGEN, Inhale 2-4 L into the lungs continuous. 2 L qd and 4 L qhs, Disp: , Rfl:    Potassium Gluconate 80 MG TABS, Take 1 tablet by mouth daily., Disp: , Rfl:    torsemide (DEMADEX) 5 MG tablet, Take 5 mg by mouth every morning., Disp: , Rfl:    umeclidinium-vilanterol (ANORO ELLIPTA) 62.5-25  MCG/ACT AEPB, Inhale 1 puff into the lungs in the morning., Disp: , Rfl:  Allergies  Allergen Reactions   Aczone [Dapsone] Other (See Comments)    Hypoxemia   Atarax [Hydroxyzine]     Hypoxemia    Hydrochlorothiazide     History of hyperparathyroidism    Sulfa Antibiotics Rash   Sulfasalazine Rash    I personally reviewed active problem list, medication list, allergies, family history, social history, health maintenance with the patient/caregiver today.   ROS  ***  Objective  There were no vitals filed for this visit.  There is no height or weight on file to calculate BMI.  Physical Exam ***  Recent Results (from the past 2160 hour(s))  CMP (Cancer Center only)     Status: Abnormal   Collection Time: 11/24/22  9:42 AM  Result Value Ref Range   Sodium 138 135 - 145 mmol/L   Potassium 3.9 3.5 - 5.1 mmol/L   Chloride 102 98 - 111 mmol/L   CO2 27 22 - 32 mmol/L   Glucose, Bld 117 (H) 70 - 99 mg/dL    Comment: Glucose reference range applies only to samples taken after fasting for at least 8 hours.   BUN 16 8 - 23 mg/dL   Creatinine 1.61 0.96 - 1.00 mg/dL   Calcium 9.5 8.9 - 04.5 mg/dL   Total Protein 7.9 6.5 - 8.1 g/dL   Albumin 4.2 3.5 - 5.0 g/dL   AST 47 (H) 15 - 41 U/L   ALT 44 0 - 44 U/L   Alkaline Phosphatase 107 38 - 126 U/L   Total Bilirubin 0.4 0.3 - 1.2 mg/dL   GFR, Estimated >40 >98 mL/min    Comment: (NOTE) Calculated using the CKD-EPI Creatinine Equation (2021)    Anion gap 9 5 - 15    Comment: Performed at [facility], [address] [place], [place] [address]  CBC with Differential (Cancer Center Only)     Status: None   Collection Time: 11/24/22  9:42 AM  Result Value Ref Range   WBC Count 9.5 4.0 - 10.5 K/uL   RBC 4.25 3.87 - 5.11 MIL/uL   Hemoglobin 12.2 12.0 - 15.0 g/dL   HCT 78.2 95.6 - 21.3 %   MCV 89.6 80.0 - 100.0 fL   MCH 28.7 26.0 - 34.0 pg   MCHC 32.0 30.0 - 36.0 g/dL   RDW 08.6 57.8 - 46.9 %   Platelet Count 257 150 -  400 K/uL   nRBC 0.0 0.0 - 0.2 %   Neutrophils Relative % 71 %   Neutro Abs 6.8 1.7 - 7.7 K/uL   Lymphocytes Relative 17 %   Lymphs Abs 1.6 0.7 - 4.0 K/uL   Monocytes Relative 7 %   Monocytes Absolute 0.7 0.1 - 1.0 K/uL   Eosinophils Relative 4 %   Eosinophils Absolute 0.4 0.0 - 0.5 K/uL   Basophils Relative 1 %   Basophils Absolute 0.1 0.0 - 0.1 K/uL   Immature Granulocytes 0 %   Abs Immature Granulocytes 0.03 0.00 - 0.07 K/uL    Comment: Performed at [facility], [address] [place], [place] [address]    PHQ2/9:    12/09/2022    2:02 PM 09/13/2022    1:09 PM 08/30/2022   11:21 AM 08/30/2022    7:56 AM 08/03/2022    2:41 PM  Depression screen PHQ 2/9  Decreased Interest 0 0 1 0 0  Down, Depressed, Hopeless 0 0 1 0 0  PHQ - 2 Score 0 0 2 0 0  Altered sleeping  3 0 0  Tired, decreased energy   3 1 1   Change in appetite   0 0 1  Feeling bad or failure about yourself    0 0 0  Trouble concentrating   0 0 0  Moving slowly or fidgety/restless   0 0 2  Suicidal thoughts   0 0 0  PHQ-9 Score   8 1 4   Difficult doing work/chores   Somewhat difficult Not difficult at all Not difficult at all    phq 9 is {gen pos [id number]}   Fall Risk:    12/09/2022    2:02 PM 09/13/2022    1:09 PM 08/30/2022   11:21 AM 08/30/2022    7:54 AM 08/03/2022    2:41 PM  Fall Risk   Falls in the past year? 0 0 0 0 0  Number falls in past yr: 0   0   Injury with Fall? 0   0   Risk for fall due to : History of fall(s)  No Fall Risks  No Fall Risks  Follow up   Falls prevention discussed;Education provided;Falls evaluation completed Falls evaluation completed Falls prevention discussed;Education provided;Falls evaluation completed      Functional Status Survey:      Assessment & Plan  *** There are no diagnoses linked to this encounter.

## 2022-12-22 ENCOUNTER — Ambulatory Visit: Payer: Medicare HMO | Admitting: Family Medicine

## 2022-12-22 DIAGNOSIS — E1169 Type 2 diabetes mellitus with other specified complication: Secondary | ICD-10-CM

## 2022-12-30 NOTE — Progress Notes (Signed)
Name: Judy Allen   MRN: [id number]    DOB: May 01, 1956   Date:12/31/2022       Progress Note  Subjective  Chief Complaint  Follow Up  HPI  History of anal cancer: patient was diagnosed with anal cancer in 2017, biopsy done by Dr. Evette Cristal and treated with radiation at [facility]. She has been released from [facility]   Breast cancer - left side: found by abnormal mammogram and biopsy positive 07/05/2022 , invasive mammary carcinoma. She did not have radiation only taking Arimidex for 5 years   MDD: long history of depression, currently on Effexor 225 mg daily , she used to take abilify but it caused tremors.after that we tried Vraylar but also caused tremors once she went up on the dose, she would like to resume and stay at lower dose . She is taking Buspar tid. .  She does not want to see a psychiatrist . She went to [facility] in the past but does not want to go back at this time.   Hypertriglyceridemiareviewed last labs done in our office was 07/2023 LDL was up from 70 to  103 . Currently on  Lovaza and Gemfibrozil , denies side effects. We will resume Crestor today    HTN/Paroxysmal tachycardia: .she sees Dr. Austin Miles, she states she is still taking Telmisartan 80 mg , NIfedipine    Hyperparathyroidism: diagnosed years ago and used to see Dr. Tedd Sias. She has been off HCTZ and last parathyroid test and vitamin D back to normal   Emphysema and hypoxemia: on oxygen 2  liters during the day and 4 liters when sleeping . She continues to have sob is stable and only with activity, denies current cough but has noticed mild wheezing since yesterday  . Under the care of pulmonologist at [facility]. Unchanged    DMII: diagnosed 02/2018 by Dr. Tedd Sias, hgbA1C was at 6.6%, A1C  but has been off Metformin and A1C has been within normal limits, today A1C is 6.4 %  She has diabetes neuropathy - pain on both feet . She also has dyslipidemia , LDL went up and we will resume Crestor ( discontinued last year by another provider) . She is  taking Gabapentin but does not seem to be helping with symptoms. She needs to discuss it with Dr. Cherylann Ratel, consider going up to 4 per day    Chronic pain: under the care of Dr. Cherylann Ratel, she states she is doing well on Gabapentin , recently starting on new pain management - of Buprenorphine film , also taking muscle relaxer prn .She has a history of laminectomy. She has neck and back pain, pain level right now is 2/10   Senile purpura: stable on arms, reassurance given   Psoriasis: she is doing well, no current rashes .she lost to follow up with dermatologist   Patient Active Problem List   Diagnosis Date Noted   Aromatase inhibitor use 11/24/2022   Macular degeneration 08/30/2022   Genetic testing 08/20/2022   Breast cancer (HCC) 07/26/2022   Breast cancer, left (HCC) 07/10/2022   Invasive carcinoma of breast (HCC) 07/10/2022   Goals of care, counseling/discussion 07/10/2022   Cystocele with rectocele 12/28/2021   S/P vaginal hysterectomy 12/28/2021   Bilateral hip pain 11/03/2021   Hypotension due to drugs 05/03/2021   Disorder of SI (sacroiliac) joint 11/11/2020   Arthritis of sacroiliac joint of both sides 11/11/2020   Chronic radicular lumbar pain 11/11/2020   Hyperparathyroidism (HCC) 08/25/2019   Diabetes mellitus type 2 in obese  11/06/2018   Cervical radiculopathy (left) 08/31/2018   Osteoarthritis of spine with radiculopathy, cervical region 08/31/2018   History of anal cancer 05/30/2018   Myofascial pain syndrome, cervical 05/16/2018   Chronic pain syndrome 05/16/2018   Cervical facet joint syndrome 05/16/2018   Perihepatitis (HCC) 09/27/2017   Fatty liver 12/22/2016   Chronic respiratory failure with hypoxia, on home oxygen therapy (HCC) 07/28/2016   Centrilobular emphysema (HCC) 07/14/2016   Abnormal Pap smear of cervix 04/14/2016   Anal squamous cell carcinoma (HCC) 03/09/2016   Non-thrombocytopenic purpura (HCC) 01/12/2016   Chronic constipation 10/16/2015   History  of fusion of cervical spine 04/02/2015   Benign hypertension 01/16/2015   Chronic cervical pain 01/16/2015   CN (constipation) 01/16/2015   Gastric reflux 01/16/2015   Hypertriglyceridemia 01/16/2015   Edema leg 01/16/2015   Chronic recurrent major depressive disorder (HCC) 01/16/2015   Dysmetabolic syndrome 01/16/2015   Obstructive apnea 01/16/2015   Psoriasis 01/16/2015   Allergic rhinitis 01/16/2015   Bursitis, trochanteric 01/16/2015   GAD (generalized anxiety disorder) 01/16/2015   Tachycardia, paroxysmal (HCC)     Past Surgical History:  Procedure Laterality Date   ANTERIOR AND POSTERIOR REPAIR N/A 12/28/2021   Procedure: ANTERIOR (CYSTOCELE) AND POSTERIOR REPAIR (RECTOCELE);  Surgeon: Hildred Laser, MD;  Location: [facility];  Service: Gynecology;  Laterality: N/A;   ANTERIOR CERVICAL DECOMP/DISCECTOMY FUSION N/A 2001   PROCEDURE: ACDF C4-C7   ANTERIOR FUSION LUMBAR SPINE  1990   plate in back, not metal   BREAST BIOPSY Left 07/05/2022   [place] Bx, Ribbon Clip, Path Pending   BREAST BIOPSY Left 07/05/2022   [place] LT BREAST BX W LOC DEV 1ST LESION IMG BX SPEC US GUIDE 07/05/2022 [facility]   BREAST BIOPSY Left 07/22/2022   [place] LT RADIO FREQUENCY TAG LOC US GUIDE 07/22/2022 [facility]   CARDIAC CATHETERIZATION  2009?    [facility]   CARDIAC CATHETERIZATION  05/2012   [facility]: Mild hypertension 37/12 with a mean pressure of 21 mm mercury   CHOLECYSTECTOMY N/A 09/12/2017   Procedure: LAPAROSCOPIC CHOLECYSTECTOMY WITH INTRAOPERATIVE CHOLANGIOGRAM;  Surgeon: Earline Mayotte, MD;  Location: [facility];  Service: General;  Laterality: N/A;   COLONOSCOPY  2018   LIVER BIOPSY N/A 09/12/2017   Procedure: LIVER BIOPSY;  Surgeon: Earline Mayotte, MD;  Location: [facility];  Service: General;  Laterality: N/A;   MASS EXCISION Left 02/23/2016   Procedure: EXCISION MASS;  Surgeon: Kieth Brightly, MD;  Location: [facility];  Service: General;  Laterality: Left;   PART  MASTECTOMY,RADIO FREQUENCY LOCALIZER,AXILLARY SENTINEL NODE BIOPSY Left 07/26/2022   Procedure: PART MASTECTOMY,RADIO FREQUENCY LOCALIZER,AXILLARY SENTINEL NODE BIOPSY;  Surgeon: Carolan Shiver, MD;  Location: [facility];  Service: General;  Laterality: Left;   PORT-A-CATH REMOVAL  2018   PORTACATH PLACEMENT Left 03/12/2016   Procedure: INSERTION PORT-A-CATH;  Surgeon: Kieth Brightly, MD;  Location: [facility];  Service: General;  Laterality: Left;   TONSILLECTOMY Bilateral 1959   TRIGGER FINGER RELEASE Right 09/2018   Dr. Hyacinth Meeker    TUBAL LIGATION     s/p   VAGINAL HYSTERECTOMY N/A 12/28/2021   Procedure: HYSTERECTOMY VAGINAL;  Surgeon: Hildred Laser, MD;  Location: [facility];  Service: Gynecology;  Laterality: N/A;    Family History  Problem Relation Age of Onset   Heart attack Mother    Asthma Mother    Liver cancer Father 98   Cancer Brother        unk type; metastatic   Breast cancer Maternal Aunt  dx 20s-30s   Breast cancer Cousin        dx 80s   Bladder Cancer Neg Hx    Kidney cancer Neg Hx    Colon cancer Neg Hx     Social History   Tobacco Use   Smoking status: Former    Packs/day: 1.00    Years: 32.00    Additional pack years: 0.00    Total pack years: 32.00    Types: Cigarettes    Start date: 08/10/1963    Quit date: 01/15/2006    Years since quitting: 16.9   Smokeless tobacco: Never  Substance Use Topics   Alcohol use: No     Current Outpatient Medications:    albuterol (PROVENTIL HFA;VENTOLIN HFA) 108 (90 Base) MCG/ACT inhaler, Inhale 2 puffs into the lungs every 6 (six) hours as needed for wheezing or shortness of breath., Disp: , Rfl:    anastrozole (ARIMIDEX) 1 MG tablet, Take 1 tablet (1 mg total) by mouth daily., Disp: 90 tablet, Rfl: 1   busPIRone (BUSPAR) 10 MG tablet, TAKE 1 TABLET(10 MG) BY MOUTH THREE TIMES DAILY, Disp: 90 tablet, Rfl: 0   cariprazine (VRAYLAR) 1.5 MG capsule, Take 1 capsule (1.5 mg total) by mouth daily., Disp: 30  capsule, Rfl: 0   Cholecalciferol (VITAMIN D3) 1000 units CAPS, Take 1 capsule by mouth daily., Disp: , Rfl:    cyclobenzaprine (FLEXERIL) 5 MG tablet, Take 1 tablet (5 mg total) by mouth 3 (three) times daily as needed for muscle spasms., Disp: 90 tablet, Rfl: 0   EFFEXOR XR 75 MG 24 hr capsule, TAKE 3 CAPSULES DAILY WITH BREAKFAST, Disp: 270 capsule, Rfl: 1   gabapentin (NEURONTIN) 400 MG capsule, Take 1 capsule (400 mg total) by mouth 3 (three) times daily., Disp: 90 capsule, Rfl: 5   HYDROcodone-acetaminophen (NORCO) 10-325 MG tablet, Take 1 tablet by mouth every 8 (eight) hours as needed for severe pain. Must last 30 days., Disp: 90 tablet, Rfl: 0   [START ON 01/08/2023] HYDROcodone-acetaminophen (NORCO) 10-325 MG tablet, Take 1 tablet by mouth every 8 (eight) hours as needed for severe pain. Must last 30 days., Disp: 90 tablet, Rfl: 0   [START ON 02/07/2023] HYDROcodone-acetaminophen (NORCO) 10-325 MG tablet, Take 1 tablet by mouth every 8 (eight) hours as needed for severe pain. Must last 30 days., Disp: 90 tablet, Rfl: 0   metoprolol succinate (TOPROL-XL) 50 MG 24 hr tablet, Take 50 mg by mouth in the morning., Disp: , Rfl:    Multiple Vitamins-Minerals (EYE VITAMINS & MINERALS PO), Take 1 capsule by mouth in the morning. [employer] Eye Vitamins, Disp: , Rfl:    NIFEdipine (PROCARDIA-XL/NIFEDICAL-XL) 30 MG 24 hr tablet, Take 30 mg by mouth at bedtime., Disp: , Rfl:    omeprazole (PRILOSEC) 40 MG capsule, TAKE 1 CAPSULE DAILY, Disp: 90 capsule, Rfl: 3   OXYGEN, Inhale 2-4 L into the lungs continuous. 2 L qd and 4 L qhs, Disp: , Rfl:    Potassium Gluconate 80 MG TABS, Take 1 tablet by mouth daily., Disp: , Rfl:    rosuvastatin (CRESTOR) 10 MG tablet, Take 1 tablet (10 mg total) by mouth daily., Disp: 90 tablet, Rfl: 1   telmisartan (MICARDIS) 80 MG tablet, Take 80 mg by mouth daily., Disp: , Rfl:    torsemide (DEMADEX) 5 MG tablet, Take 5 mg by mouth every morning., Disp: , Rfl:     umeclidinium-vilanterol (ANORO ELLIPTA) 62.5-25 MCG/ACT AEPB, Inhale 1 puff into the lungs in the morning., Disp: ,  Rfl:    omega-3 acid ethyl esters (LOVAZA) 1 g capsule, TAKE 2 CAPSULES BY MOUTH TWO TIMES A DAY, Disp: 360 capsule, Rfl: 1  Allergies  Allergen Reactions   Aczone [Dapsone] Other (See Comments)    Hypoxemia   Atarax [Hydroxyzine]     Hypoxemia    Hydrochlorothiazide     History of hyperparathyroidism    Sulfa Antibiotics Rash   Sulfasalazine Rash    I personally reviewed active problem list, medication list, allergies, family history, social history, health maintenance with the patient/caregiver today.   ROS  Ten systems reviewed and is negative except as mentioned in HPI   Objective  Vitals:   12/31/22 1546  BP: 126/76  Pulse: 88  Resp: 16  Temp: 97.8 F (36.6 C)  TempSrc: Oral  SpO2: 90%  Weight: 179 lb 12.8 oz (81.6 kg)  Height: 5' 3.5" (1.613 m)    Body mass index is 31.35 kg/m.  Physical Exam  Constitutional: Patient appears well-developed and well-nourished. Obese  No distress.  HEENT: head atraumatic, normocephalic, pupils equal and reactive to light,, neck supple Cardiovascular: Normal rate, regular rhythm and normal heart sounds.  No murmur heard. No BLE edema. Pulmonary/Chest: Effort normal and breath sounds normal. No respiratory distress. Abdominal: Soft.  There is no tenderness. Psychiatric: Patient has a normal mood and affect. behavior is normal. Judgment and thought content normal.   Recent Results (from the past 2160 hour(s))  CMP (Cancer Center only)     Status: Abnormal   Collection Time: 11/24/22  9:42 AM  Result Value Ref Range   Sodium 138 135 - 145 mmol/L   Potassium 3.9 3.5 - 5.1 mmol/L   Chloride 102 98 - 111 mmol/L   CO2 27 22 - 32 mmol/L   Glucose, Bld 117 (H) 70 - 99 mg/dL    Comment: Glucose reference range applies only to samples taken after fasting for at least 8 hours.   BUN 16 8 - 23 mg/dL   Creatinine 1.61  0.96 - 1.00 mg/dL   Calcium 9.5 8.9 - 04.5 mg/dL   Total Protein 7.9 6.5 - 8.1 g/dL   Albumin 4.2 3.5 - 5.0 g/dL   AST 47 (H) 15 - 41 U/L   ALT 44 0 - 44 U/L   Alkaline Phosphatase 107 38 - 126 U/L   Total Bilirubin 0.4 0.3 - 1.2 mg/dL   GFR, Estimated >40 >98 mL/min    Comment: (NOTE) Calculated using the CKD-EPI Creatinine Equation (2021)    Anion gap 9 5 - 15    Comment: Performed at [facility], [address] [place], [place] [address]  CBC with Differential (Cancer Center Only)     Status: None   Collection Time: 11/24/22  9:42 AM  Result Value Ref Range   WBC Count 9.5 4.0 - 10.5 K/uL   RBC 4.25 3.87 - 5.11 MIL/uL   Hemoglobin 12.2 12.0 - 15.0 g/dL   HCT 78.2 95.6 - 21.3 %   MCV 89.6 80.0 - 100.0 fL   MCH 28.7 26.0 - 34.0 pg   MCHC 32.0 30.0 - 36.0 g/dL   RDW 08.6 57.8 - 46.9 %   Platelet Count 257 150 - 400 K/uL   nRBC 0.0 0.0 - 0.2 %   Neutrophils Relative % 71 %   Neutro Abs 6.8 1.7 - 7.7 K/uL   Lymphocytes Relative 17 %   Lymphs Abs 1.6 0.7 - 4.0 K/uL   Monocytes Relative 7 %  Monocytes Absolute 0.7 0.1 - 1.0 K/uL   Eosinophils Relative 4 %   Eosinophils Absolute 0.4 0.0 - 0.5 K/uL   Basophils Relative 1 %   Basophils Absolute 0.1 0.0 - 0.1 K/uL   Immature Granulocytes 0 %   Abs Immature Granulocytes 0.03 0.00 - 0.07 K/uL    Comment: Performed at [facility], [address] [place], [place] [address]  POCT HgB A1C     Status: Abnormal   Collection Time: 12/31/22  3:49 PM  Result Value Ref Range   Hemoglobin A1C 6.4 (A) 4.0 - 5.6 %   HbA1c POC (<> result, manual entry)     HbA1c, POC (prediabetic range)     HbA1c, POC (controlled diabetic range)      PHQ2/9:    12/31/2022    3:48 PM 12/09/2022    2:02 PM 09/13/2022    1:09 PM 08/30/2022   11:21 AM 08/30/2022    7:56 AM  Depression screen PHQ 2/9  Decreased Interest 2 0 0 1 0  Down, Depressed, Hopeless 2 0 0 1 0  PHQ - 2 Score 4 0 0 2 0  Altered sleeping 0   3 0  Tired, decreased  energy 2   3 1   Change in appetite 3   0 0  Feeling bad or failure about yourself  1   0 0  Trouble concentrating 0   0 0  Moving slowly or fidgety/restless 0   0 0  Suicidal thoughts 0   0 0  PHQ-9 Score 10   8 1   Difficult doing work/chores Not difficult at all   Somewhat difficult Not difficult at all    phq 9 is negative   Fall Risk:    12/31/2022    3:48 PM 12/09/2022    2:02 PM 09/13/2022    1:09 PM 08/30/2022   11:21 AM 08/30/2022    7:54 AM  Fall Risk   Falls in the past year? 0 0 0 0 0  Number falls in past yr:  0   0  Injury with Fall?  0   0  Risk for fall due to : No Fall Risks History of fall(s)  No Fall Risks   Follow up Falls prevention discussed;Education provided;Falls evaluation completed   Falls prevention discussed;Education provided;Falls evaluation completed Falls evaluation completed     Assessment & Plan  1. Dyslipidemia associated with type 2 diabetes mellitus (HCC)  - POCT HgB A1C - rosuvastatin (CRESTOR) 10 MG tablet; Take 1 tablet (10 mg total) by mouth daily.  Dispense: 90 tablet; Refill: 1  2. Centrilobular emphysema (HCC)  On medical management and oxygen  3. Senile purpura (HCC)  Reassurance given   4. Major depression, recurrent, chronic (HCC)  Per patient request we will resume Vraylar but stay on lower dose to avoid side effects, reminded her to go to psychiatrist but she refuses it - cariprazine (VRAYLAR) 1.5 MG capsule; Take 1 capsule (1.5 mg total) by mouth daily.  Dispense: 30 capsule; Refill: 0  5. Chronic respiratory failure with hypoxia, on home oxygen therapy (HCC)  On nasal canula oxygen  6. Hyperparathyroidism (HCC)  Keep visits with Dr. Austin Miles  7. Hypertriglyceridemia  - omega-3 acid ethyl esters (LOVAZA) 1 g capsule; TAKE 2 CAPSULES BY MOUTH TWO TIMES A DAY  Dispense: 360 capsule; Refill: 1  8. Chronic pain syndrome  Seeing Dr. Cherylann Ratel   Discussed importance of bringing all her medications during office visits for  medication  reconciliation

## 2022-12-31 ENCOUNTER — Encounter: Payer: Self-pay | Admitting: Family Medicine

## 2022-12-31 ENCOUNTER — Ambulatory Visit (INDEPENDENT_AMBULATORY_CARE_PROVIDER_SITE_OTHER): Payer: Medicare HMO | Admitting: Family Medicine

## 2022-12-31 VITALS — BP 126/76 | HR 88 | Temp 97.8°F | Resp 16 | Ht 63.5 in | Wt 179.8 lb

## 2022-12-31 DIAGNOSIS — J9611 Chronic respiratory failure with hypoxia: Secondary | ICD-10-CM

## 2022-12-31 DIAGNOSIS — D692 Other nonthrombocytopenic purpura: Secondary | ICD-10-CM

## 2022-12-31 DIAGNOSIS — E781 Pure hyperglyceridemia: Secondary | ICD-10-CM | POA: Diagnosis not present

## 2022-12-31 DIAGNOSIS — G894 Chronic pain syndrome: Secondary | ICD-10-CM

## 2022-12-31 DIAGNOSIS — Z9981 Dependence on supplemental oxygen: Secondary | ICD-10-CM

## 2022-12-31 DIAGNOSIS — J432 Centrilobular emphysema: Secondary | ICD-10-CM | POA: Diagnosis not present

## 2022-12-31 DIAGNOSIS — E213 Hyperparathyroidism, unspecified: Secondary | ICD-10-CM

## 2022-12-31 DIAGNOSIS — E785 Hyperlipidemia, unspecified: Secondary | ICD-10-CM | POA: Diagnosis not present

## 2022-12-31 DIAGNOSIS — F339 Major depressive disorder, recurrent, unspecified: Secondary | ICD-10-CM | POA: Diagnosis not present

## 2022-12-31 DIAGNOSIS — E1169 Type 2 diabetes mellitus with other specified complication: Secondary | ICD-10-CM

## 2022-12-31 LAB — POCT GLYCOSYLATED HEMOGLOBIN (HGB A1C): Hemoglobin A1C: 6.4 % — AB (ref 4.0–5.6)

## 2022-12-31 MED ORDER — OMEGA-3-ACID ETHYL ESTERS 1 G PO CAPS
ORAL_CAPSULE | ORAL | 1 refills | Status: DC
Start: 2022-12-31 — End: 2023-05-16

## 2022-12-31 MED ORDER — CARIPRAZINE HCL 1.5 MG PO CAPS
1.5000 mg | ORAL_CAPSULE | Freq: Every day | ORAL | 0 refills | Status: DC
Start: 2022-12-31 — End: 2023-02-25

## 2022-12-31 MED ORDER — ROSUVASTATIN CALCIUM 10 MG PO TABS
10.0000 mg | ORAL_TABLET | Freq: Every day | ORAL | 1 refills | Status: DC
Start: 2022-12-31 — End: 2023-05-16

## 2023-01-04 ENCOUNTER — Other Ambulatory Visit: Payer: Self-pay | Admitting: Family Medicine

## 2023-01-04 ENCOUNTER — Encounter: Payer: Self-pay | Admitting: Family Medicine

## 2023-01-04 ENCOUNTER — Other Ambulatory Visit: Payer: Self-pay

## 2023-01-04 DIAGNOSIS — F339 Major depressive disorder, recurrent, unspecified: Secondary | ICD-10-CM

## 2023-01-04 MED ORDER — BUSPIRONE HCL 10 MG PO TABS
ORAL_TABLET | ORAL | 0 refills | Status: DC
Start: 2023-01-04 — End: 2023-02-03

## 2023-01-08 DIAGNOSIS — C211 Malignant neoplasm of anal canal: Secondary | ICD-10-CM | POA: Diagnosis not present

## 2023-01-08 DIAGNOSIS — G4733 Obstructive sleep apnea (adult) (pediatric): Secondary | ICD-10-CM | POA: Diagnosis not present

## 2023-01-08 DIAGNOSIS — R0902 Hypoxemia: Secondary | ICD-10-CM | POA: Diagnosis not present

## 2023-01-08 DIAGNOSIS — I270 Primary pulmonary hypertension: Secondary | ICD-10-CM | POA: Diagnosis not present

## 2023-01-08 DIAGNOSIS — J432 Centrilobular emphysema: Secondary | ICD-10-CM | POA: Diagnosis not present

## 2023-01-20 ENCOUNTER — Encounter: Payer: Self-pay | Admitting: Family Medicine

## 2023-01-24 ENCOUNTER — Other Ambulatory Visit: Payer: Self-pay | Admitting: Family Medicine

## 2023-01-24 DIAGNOSIS — E781 Pure hyperglyceridemia: Secondary | ICD-10-CM

## 2023-01-30 ENCOUNTER — Encounter: Payer: Self-pay | Admitting: Student in an Organized Health Care Education/Training Program

## 2023-01-31 ENCOUNTER — Other Ambulatory Visit: Payer: Self-pay

## 2023-01-31 DIAGNOSIS — G894 Chronic pain syndrome: Secondary | ICD-10-CM

## 2023-02-01 MED ORDER — CYCLOBENZAPRINE HCL 5 MG PO TABS
5.0000 mg | ORAL_TABLET | Freq: Three times a day (TID) | ORAL | 0 refills | Status: DC | PRN
Start: 2023-02-01 — End: 2023-03-08

## 2023-02-03 ENCOUNTER — Other Ambulatory Visit: Payer: Self-pay | Admitting: Family Medicine

## 2023-02-03 DIAGNOSIS — F339 Major depressive disorder, recurrent, unspecified: Secondary | ICD-10-CM

## 2023-02-07 DIAGNOSIS — R0902 Hypoxemia: Secondary | ICD-10-CM | POA: Diagnosis not present

## 2023-02-07 DIAGNOSIS — I270 Primary pulmonary hypertension: Secondary | ICD-10-CM | POA: Diagnosis not present

## 2023-02-07 DIAGNOSIS — J432 Centrilobular emphysema: Secondary | ICD-10-CM | POA: Diagnosis not present

## 2023-02-07 DIAGNOSIS — G4733 Obstructive sleep apnea (adult) (pediatric): Secondary | ICD-10-CM | POA: Diagnosis not present

## 2023-02-07 DIAGNOSIS — C211 Malignant neoplasm of anal canal: Secondary | ICD-10-CM | POA: Diagnosis not present

## 2023-02-08 DIAGNOSIS — Z79811 Long term (current) use of aromatase inhibitors: Secondary | ICD-10-CM | POA: Diagnosis not present

## 2023-02-08 DIAGNOSIS — E119 Type 2 diabetes mellitus without complications: Secondary | ICD-10-CM | POA: Diagnosis not present

## 2023-02-08 DIAGNOSIS — J479 Bronchiectasis, uncomplicated: Secondary | ICD-10-CM | POA: Diagnosis not present

## 2023-02-08 DIAGNOSIS — J9611 Chronic respiratory failure with hypoxia: Secondary | ICD-10-CM | POA: Diagnosis not present

## 2023-02-08 DIAGNOSIS — Z87891 Personal history of nicotine dependence: Secondary | ICD-10-CM | POA: Diagnosis not present

## 2023-02-08 DIAGNOSIS — J438 Other emphysema: Secondary | ICD-10-CM | POA: Diagnosis not present

## 2023-02-08 DIAGNOSIS — C50919 Malignant neoplasm of unspecified site of unspecified female breast: Secondary | ICD-10-CM | POA: Diagnosis not present

## 2023-02-08 DIAGNOSIS — G4733 Obstructive sleep apnea (adult) (pediatric): Secondary | ICD-10-CM | POA: Diagnosis not present

## 2023-02-08 DIAGNOSIS — Z9010 Acquired absence of unspecified breast and nipple: Secondary | ICD-10-CM | POA: Diagnosis not present

## 2023-02-08 DIAGNOSIS — I272 Pulmonary hypertension, unspecified: Secondary | ICD-10-CM | POA: Diagnosis not present

## 2023-02-14 DIAGNOSIS — R21 Rash and other nonspecific skin eruption: Secondary | ICD-10-CM | POA: Diagnosis not present

## 2023-02-14 DIAGNOSIS — R197 Diarrhea, unspecified: Secondary | ICD-10-CM | POA: Diagnosis not present

## 2023-02-14 DIAGNOSIS — R509 Fever, unspecified: Secondary | ICD-10-CM | POA: Diagnosis not present

## 2023-02-14 DIAGNOSIS — R0602 Shortness of breath: Secondary | ICD-10-CM | POA: Diagnosis not present

## 2023-02-14 DIAGNOSIS — Z20822 Contact with and (suspected) exposure to covid-19: Secondary | ICD-10-CM | POA: Diagnosis not present

## 2023-02-14 DIAGNOSIS — M47814 Spondylosis without myelopathy or radiculopathy, thoracic region: Secondary | ICD-10-CM | POA: Diagnosis not present

## 2023-02-14 DIAGNOSIS — I700 Atherosclerosis of aorta: Secondary | ICD-10-CM | POA: Diagnosis not present

## 2023-02-15 DIAGNOSIS — J9610 Chronic respiratory failure, unspecified whether with hypoxia or hypercapnia: Secondary | ICD-10-CM | POA: Diagnosis not present

## 2023-02-25 ENCOUNTER — Encounter: Payer: Self-pay | Admitting: Family Medicine

## 2023-02-25 ENCOUNTER — Other Ambulatory Visit: Payer: Self-pay

## 2023-02-25 ENCOUNTER — Other Ambulatory Visit: Payer: Self-pay | Admitting: Family Medicine

## 2023-02-25 DIAGNOSIS — I10 Essential (primary) hypertension: Secondary | ICD-10-CM | POA: Diagnosis not present

## 2023-02-25 DIAGNOSIS — F339 Major depressive disorder, recurrent, unspecified: Secondary | ICD-10-CM

## 2023-02-25 DIAGNOSIS — E213 Hyperparathyroidism, unspecified: Secondary | ICD-10-CM | POA: Diagnosis not present

## 2023-02-25 MED ORDER — CARIPRAZINE HCL 1.5 MG PO CAPS
1.5000 mg | ORAL_CAPSULE | Freq: Every day | ORAL | 0 refills | Status: DC
Start: 2023-02-25 — End: 2023-05-16

## 2023-03-02 ENCOUNTER — Other Ambulatory Visit: Payer: Self-pay | Admitting: Family Medicine

## 2023-03-02 DIAGNOSIS — F339 Major depressive disorder, recurrent, unspecified: Secondary | ICD-10-CM

## 2023-03-06 ENCOUNTER — Encounter: Payer: Self-pay | Admitting: Student in an Organized Health Care Education/Training Program

## 2023-03-08 ENCOUNTER — Encounter: Payer: Self-pay | Admitting: Student in an Organized Health Care Education/Training Program

## 2023-03-08 ENCOUNTER — Ambulatory Visit
Payer: Medicare HMO | Attending: Student in an Organized Health Care Education/Training Program | Admitting: Student in an Organized Health Care Education/Training Program

## 2023-03-08 VITALS — BP 124/72 | HR 92 | Temp 97.2°F | Resp 18 | Ht 63.5 in | Wt 180.0 lb

## 2023-03-08 DIAGNOSIS — M542 Cervicalgia: Secondary | ICD-10-CM | POA: Insufficient documentation

## 2023-03-08 DIAGNOSIS — M5416 Radiculopathy, lumbar region: Secondary | ICD-10-CM

## 2023-03-08 DIAGNOSIS — M5412 Radiculopathy, cervical region: Secondary | ICD-10-CM

## 2023-03-08 DIAGNOSIS — G894 Chronic pain syndrome: Secondary | ICD-10-CM

## 2023-03-08 DIAGNOSIS — G8929 Other chronic pain: Secondary | ICD-10-CM

## 2023-03-08 MED ORDER — HYDROCODONE-ACETAMINOPHEN 10-325 MG PO TABS: 1.0000 | ORAL_TABLET | Freq: Three times a day (TID) | ORAL | 0 refills | Status: AC | PRN

## 2023-03-08 MED ORDER — HYDROCODONE-ACETAMINOPHEN 10-325 MG PO TABS: 1.0000 | ORAL_TABLET | Freq: Three times a day (TID) | ORAL | 0 refills | Status: DC | PRN

## 2023-03-08 MED ORDER — CYCLOBENZAPRINE HCL 5 MG PO TABS
5.0000 mg | ORAL_TABLET | Freq: Three times a day (TID) | ORAL | 2 refills | Status: DC | PRN
Start: 2023-03-08 — End: 2023-05-31

## 2023-03-08 NOTE — Progress Notes (Signed)
Nursing Pain Medication Assessment:  Safety precautions to be maintained throughout the outpatient stay will include: orient to surroundings, keep bed in low position, maintain call bell within reach at all times, provide assistance with transfer out of bed and ambulation.  Medication Inspection Compliance: Pill count conducted under aseptic conditions, in front of the patient. Neither the pills nor the bottle was removed from the patient's sight at any time. Once count was completed pills were immediately returned to the patient in their original bottle.  Medication: Hydrocodone/APAP Pill/Patch Count:  5 of 90 pills remain Pill/Patch Appearance: Markings consistent with prescribed medication Bottle Appearance: Standard pharmacy container. Clearly labeled. Filled Date: 7 / 1 / 2024 Last Medication intake:  Today

## 2023-03-08 NOTE — Progress Notes (Signed)
PROVIDER NOTE: Information contained herein reflects review and annotations entered in association with encounter. Interpretation of such information and data should be left to medically-trained personnel. Information provided to patient can be located elsewhere in the medical record under "Patient Instructions". Document created using STT-dictation technology, any transcriptional errors that may result from process are unintentional.    Patient: Judy Allen  Service Category: E/M  Provider: Edward Jolly, MD  DOB: [birth year]  DOS: 03/08/2023  Referring Provider: Alba Cory, MD  MRN: [id number]  Specialty: Interventional Pain Management  PCP: Alba Cory, MD  Type: Established Patient  Setting: Ambulatory outpatient    Location: Office  Delivery: Face-to-face     HPI  [name], a 67 y.o. year old female, is here today because of her Chronic pain syndrome [G89.4]. [name] primary complain today is Neck Pain Last encounter: My last encounter with her was on 12/09/2022 Pertinent problems: Ms. Giltner has Chronic recurrent major depressive disorder (HCC); GAD (generalized anxiety disorder); Myofascial pain syndrome, cervical; Chronic pain syndrome; Cervical facet joint syndrome; Cervical radiculopathy (left); and Osteoarthritis of spine with radiculopathy, cervical region on their pertinent problem list. Pain Assessment: Severity of Chronic pain is reported as a 3 /10. Location: Neck Posterior, Right, Left/down left arm, sometimes to include left fingers. Onset: More than a month ago. Quality: Sharp, Burning. Timing: Constant. Modifying factor(s): meds. Vitals:  height is 5' 3.5" (1.613 m) and weight is 180 lb (81.6 kg). Her temperature is 97.2 F (36.2 C) (abnormal). Her blood pressure is 124/72 and her pulse is 92. Her respiration is 18 and oxygen saturation is 93%.   Reason for encounter: medication management.  Patient would like to discontinue her belbuca as she states that it is  not helping and she is having trouble with application of film to buccal mucosa.  She would like to continue on hydrocodone but is requesting a slight dose increase since we are stopping her belbuca. She also would like to trial another muscle relaxant as she states that the tizanidine is less effective and is resulting in sedation. Renew urine toxicology screen today for medication compliance and monitoring Continue gabapentin as prescribed 300 mg 3 times daily   Pharmacotherapy Assessment  Analgesic: Hydrocodone 10 mg 3 times daily as needed  Monitoring: [place] PMP: PDMP not reviewed this encounter.       Pharmacotherapy: No side-effects or adverse reactions reported. Compliance: No problems identified. Effectiveness: Clinically acceptable.  Nonah Mattes, RN  03/08/2023  9:38 AM  Sign when Signing Visit Nursing Pain Medication Assessment:  Safety precautions to be maintained throughout the outpatient stay will include: orient to surroundings, keep bed in low position, maintain call bell within reach at all times, provide assistance with transfer out of bed and ambulation.  Medication Inspection Compliance: Pill count conducted under aseptic conditions, in front of the patient. Neither the pills nor the bottle was removed from the patient's sight at any time. Once count was completed pills were immediately returned to the patient in their original bottle.  Medication: Hydrocodone/APAP Pill/Patch Count:  5 of 90 pills remain Pill/Patch Appearance: Markings consistent with prescribed medication Bottle Appearance: Standard pharmacy container. Clearly labeled. Filled Date: 7 / 1 / 2024 Last Medication intake:  Today    No results found for: "CBDTHCR" No results found for: "D8THCCBX" No results found for: "D9THCCBX"  UDS:  Summary  Date Value Ref Range Status  11/03/2021 Note  Final    Comment:    ==================================================================== Tox[employer]  Select 13  (MW) ==================================================================== Test                             Result       Flag       Units  Drug Present and Declared for Prescription Verification   Tramadol                       >6098        EXPECTED   ng/mg creat   O-Desmethyltramadol            >6098        EXPECTED   ng/mg creat   N-Desmethyltramadol            3405         EXPECTED   ng/mg creat    Source of tramadol is a prescription medication. O-desmethyltramadol    and N-desmethyltramadol are expected metabolites of tramadol.  ==================================================================== Test                      Result    Flag   Units      Ref Range   Creatinine              82               mg/dL      >=32 ==================================================================== Declared Medications:  The flagging and interpretation on this report are based on the  following declared medications.  Unexpected results may arise from  inaccuracies in the declared medications.   **Note: The testing scope of this panel includes these medications:   Tramadol (Ultram)   **Note: The testing scope of this panel does not include the  following reported medications:   Albuterol (Ventolin HFA)  Fish Oil  Gabapentin (Neurontin)  Gemfibrozil (Lopid)  Hydrocortisone  Influenza Virus Vaccine (Fluzone)  Metoprolol (Toprol)  Omeprazole (Prilosec)  Telmisartan (Micardis)  Tizanidine (Zanaflex)  Torsemide (Demadex)  Umeclidinium (Anoro)  Venlafaxine (Effexor)  Vilanterol (Anoro)  Vitamin D2 (Drisdol) ==================================================================== For clinical consultation, please call [phone]. ====================================================================       ROS  Constitutional: Denies any fever or chills Gastrointestinal: No reported hemesis, hematochezia, vomiting, or acute GI distress Musculoskeletal:  Cervicalgia Neurological: No  reported episodes of acute onset apraxia, aphasia, dysarthria, agnosia, amnesia, paralysis, loss of coordination, or loss of consciousness  Medication Review  HYDROcodone-acetaminophen, Multiple Vitamins-Minerals, NIFEdipine, Oxygen-Helium, Potassium Gluconate, Vitamin D3, albuterol, anastrozole, busPIRone, cariprazine, cyclobenzaprine, gabapentin, metoprolol succinate, omega-3 acid ethyl esters, omeprazole, rosuvastatin, telmisartan, torsemide, umeclidinium-vilanterol, and venlafaxine XR  History Review  Allergy: Ms. Hopewell is allergic to aczone [dapsone], atarax [hydroxyzine], hydrochlorothiazide, sulfa antibiotics, and sulfasalazine. Drug: Ms. Conard  reports no history of drug use. Alcohol:  reports no history of alcohol use. Tobacco:  reports that she quit smoking about 17 years ago. Her smoking use included cigarettes. She started smoking about 59 years ago. She has a 42.4 pack-year smoking history. She has never used smokeless tobacco. Social: Ms. Benavides  reports that she quit smoking about 17 years ago. Her smoking use included cigarettes. She started smoking about 59 years ago. She has a 42.4 pack-year smoking history. She has never used smokeless tobacco. She reports that she does not drink alcohol and does not use drugs. Medical:  has a past medical history of Anal squamous cell carcinoma (HCC) (03/10/2016), Anemia, Anxiety, Aortic atherosclerosis (HCC), Arthritis, Breast cancer, left (HCC) (07/10/2022),  Complication of anesthesia, Constipation, COPD (chronic obstructive pulmonary disease) (HCC), Coronary artery calcification seen on CT scan, Depression, Diastolic dysfunction (06/08/2021), Emphysema of lung (HCC), GERD (gastroesophageal reflux disease), Goals of care, counseling/discussion (07/10/2022), H/O allergic rhinitis, Heart murmur, Hemorrhoid, History of fusion of cervical spine, History of kidney stones, Hyperlipidemia, Hyperparathyroidism (HCC), Hypertension, Macular degeneration,  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 Surgical: Ms. Guyon  has a past surgical history that includes Tubal ligation; Anterior fusion lumbar spine (1990); Mass excision (Left, 02/23/2016); Colonoscopy (2018); Portacath placement (Left, 03/12/2016); Cardiac catheterization (2009? ); Cardiac catheterization (05/2012); Port-a-cath removal (2018); Liver biopsy (N/A, 09/12/2017); Cholecystectomy (N/A, 09/12/2017); Trigger finger release (Right, 09/2018); Anterior cervical decomp/discectomy fusion (N/A, 2001); Tonsillectomy (Bilateral, 1959); Anterior and posterior repair (N/A, 12/28/2021); Vaginal hysterectomy (N/A, 12/28/2021); Breast biopsy (Left, 07/05/2022); Breast biopsy (Left, 07/05/2022); Breast biopsy (Left, 07/22/2022); and Part mastectomy,radio frequency localizer,axillary sentinel node biopsy (Left, 07/26/2022). Family: family history includes Asthma in her mother; Breast cancer in her cousin and maternal aunt; Cancer in her brother; Heart attack in her mother; Liver cancer (age of onset: 67) in her father.  Laboratory Chemistry Profile   Renal Lab Results  Component Value Date   BUN 16 11/24/2022   CREATININE 0.87 11/24/2022   LABCREA 40 05/14/2022   BCR SEE NOTE: 08/03/2022   GFRAA 75 04/23/2019   GFRNONAA >60 11/24/2022    Hepatic Lab Results  Component Value Date   AST 47 (H) 11/24/2022   ALT 44 11/24/2022   ALBUMIN 4.2 11/24/2022   ALKPHOS 107 11/24/2022   LIPASE 27 08/03/2017    Electrolytes Lab Results  Component Value Date   NA 138 11/24/2022   K 3.9 11/24/2022   CL 102 11/24/2022   CALCIUM  9.5 11/24/2022    Bone Lab Results  Component Value Date   VD25OH 57 07/22/2021    Inflammation (CRP: Acute Phase) (ESR: Chronic Phase) No results found for: "CRP", "ESRSEDRATE", "LATICACIDVEN"       Note: Above Lab results reviewed.  Recent Imaging Review  DG Bone Density EXAM: DUAL X-RAY ABSORPTIOMETRY (DXA) FOR BONE MINERAL DENSITY  IMPRESSION: Dear Rickard Patience,  Your patient ZADIE VETRANO completed a FRAX assessment on 11/25/2022 using the [facility] iDXA DXA System (analysis version: 14.10) manufactured by [employer]. The following summarizes the results of our evaluation.  PATIENT BIOGRAPHICAL: Name: Cherylee, Kreke Patient ID: [id number] Birth Date: 1956-07-21 Height:    64.0 in. Gender:     Female    [age]       Weight:    180.9 lbs. Ethnicity:  White                            Exam Date: 11/25/2022  FRAX* RESULTS:  (version: 3.5) 10-year Probability of Fracture1 Major Osteoporotic Fracture2 Hip Fracture 9.1% 1.1% Population: [place] (Caucasian) Risk Factors: None  Based on Femur (Left) Neck BMD  1 -The 10-year probability of fracture may be lower than reported if the patient has received treatment. 2 -Major Osteoporotic Fracture: Clinical Spine, Forearm, Hip or Shoulder  *FRAX is a Armed forces logistics/support/administrative officer of the [employer] for Metabolic Bone Disease, a World Health  Organization (WHO) Collaborating Centre.  ASSESSMENT: The probability of a major osteoporotic fracture is 9.1% within the next ten years.  The probability of a hip fracture is 1.1% within the next ten years. .  Your patient Delema Cohan completed a BMD test on 11/25/2022 using the [employer] DXA System (software version: 14.10) manufactured by [employer]. The following summarizes the results of our evaluation. Technologist: MTB PATIENT BIOGRAPHICAL: Name: Kensy, Huitt Patient ID: [id number] Birth Date: 10-28-1955 Height: 64.0 in. Gender:  Female Exam Date: 11/25/2022 Weight: 180.9 lbs. Indications: Caucasian, COPD, History of Breast Cancer, History of Spinal Surgery, Hysterectomy, Oophorectomy Bilateral, Postmenopausal Fractures: Treatments: Anastrozole, Vitamin D DENSITOMETRY RESULTS: Site         Region     Measured Date Measured Age WHO Classification Young Adult T-score BMD         %Change vs. Previous Significant Change (*) DualFemur Neck Left 11/25/2021 67.6 Osteopenia -1.5 0.833 g/cm2 - -  DualFemur Total Mean 11/25/2022 67.6 Normal -1.0 0.883 g/cm2 - -  Left Forearm Radius 33% 11/25/2022 67.6 Normal 0.2 0.894 g/cm2 - - ASSESSMENT: The BMD measured at Femur Neck Left is 0.833 g/cm2 with a T-score of -1.5. This patient is considered osteopenic according to World Health Organization [facility]) criteria. The scan quality is good. Lumbar spine was not utilized due to advanced degenerative changes/scoliosis.  World Science writer [facility]) criteria for post-menopausal, Caucasian Women: Normal:                   T-score at or above -1 SD Osteopenia/low bone mass: T-score between -1 and -2.5 SD Osteoporosis:             T-score at or below -2.5 SD  RECOMMENDATIONS: 1. All patients should optimize calcium and vitamin D intake. 2. Consider FDA-approved medical therapies in postmenopausal women and men aged 76 years and older, based on the following: a. A hip or vertebral(clinical or morphometric) fracture b. T-score < -2.5 at the femoral neck or spine after appropriate evaluation to exclude secondary causes c. Low bone mass (T-score between -1.0 and -2.5 at the femoral neck or spine) and a 10-year probability of a hip fracture > 3% or a 10-year probability of a major osteoporosis-related fracture > 20% based on the [place]-adapted WHO algorithm 3. Clinician judgment and/or patient preferences may indicate treatment for people with 10-year fracture probabilities above or below these levels FOLLOW-UP: People with  diagnosed cases of osteoporosis or at high risk for fracture should have regular bone mineral density tests. For patients eligible for Medicare, routine testing is allowed once every 2 years. The testing frequency can be increased to one year for patients who have rapidly progressing disease, those who are receiving or discontinuing medical therapy to restore bone mass, or have additional risk factors. I have reviewed this report, and agree with the above findings.  [facility]  Electronically Signed   By: Romona Curls M.D.   On: 11/25/2022 15:59 Note: Reviewed        Physical Exam  General appearance: Well nourished, well developed, and well hydrated. In no apparent acute distress Mental status: Alert, oriented x 3 (person, place, & time)       Respiratory: No evidence of acute respiratory distress on home oxygen 2 L Eyes: PERLA Vitals: BP 124/72   Pulse 92   Temp (!) 97.2 F (36.2 C)   Resp 18   Ht 5' 3.5" (1.613 m)   Wt 180 lb (81.6  kg)   LMP 03/04/2002 (Approximate)   SpO2 93% Comment: 2L O2 from home  BMI 31.39 kg/m  BMI: Estimated body mass index is 31.39 kg/m as calculated from the following:   Height as of this encounter: 5' 3.5" (1.613 m).   Weight as of this encounter: 180 lb (81.6 kg). Ideal: Ideal body weight: 53.5 kg (118 lb 0.9 oz) Adjusted ideal body weight: 64.8 kg (142 lb 13.3 oz)  Cervicalgia with pain radiation into bilateral upper extremity  5 out of 5 strength bilateral lower extremity: Plantar flexion, dorsiflexion, knee flexion, knee extension.   Assessment   Diagnosis Status  1. Chronic pain syndrome   2. Cervical radiculopathy (left)   3. Lumbar radiculopathy   4. Chronic cervical pain   5. Chronic radicular lumbar pain    Persistent Persistent Persistent     Plan of Care  Problem-specific:  No problem-specific Assessment & Plan notes found for this encounter.  Ms. SELDA VISTA has a current medication list which  includes the following long-term medication(s): effexor xr, gabapentin, omega-3 acid ethyl esters, omeprazole, potassium gluconate, and rosuvastatin.  Pharmacotherapy (Medications Ordered): Meds ordered this encounter  Medications   HYDROcodone-acetaminophen (NORCO) 10-325 MG tablet    Sig: Take 1 tablet by mouth every 8 (eight) hours as needed for severe pain. Must last 30 days.    Dispense:  90 tablet    Refill:  0    Chronic Pain: STOP Act (Not applicable) Fill 1 day early if closed on refill date. Avoid benzodiazepines within 8 hours of opioids   HYDROcodone-acetaminophen (NORCO) 10-325 MG tablet    Sig: Take 1 tablet by mouth every 8 (eight) hours as needed for severe pain. Must last 30 days.    Dispense:  90 tablet    Refill:  0    Chronic Pain: STOP Act (Not applicable) Fill 1 day early if closed on refill date. Avoid benzodiazepines within 8 hours of opioids   HYDROcodone-acetaminophen (NORCO) 10-325 MG tablet    Sig: Take 1 tablet by mouth every 8 (eight) hours as needed for severe pain. Must last 30 days.    Dispense:  90 tablet    Refill:  0    Chronic Pain: STOP Act (Not applicable) Fill 1 day early if closed on refill date. Avoid benzodiazepines within 8 hours of opioids   cyclobenzaprine (FLEXERIL) 5 MG tablet    Sig: Take 1 tablet (5 mg total) by mouth 3 (three) times daily as needed for muscle spasms.    Dispense:  90 tablet    Refill:  2    Do not place this medication, or any other prescription from our practice, on "Automatic Refill". Patient may have prescription filled one day early if pharmacy is closed on scheduled refill date.  Continue with Gabapentin Renew UDS  Orders Placed This Encounter  Procedures   ToxASSURE Select 13 (MW), Urine    Volume: 30 ml(s). Minimum 3 ml of urine is needed. Document temperature of fresh sample. Indications: Long term (current) use of opiate analgesic [phone])    Order Specific Question:   Release to patient    Answer:    Immediate     Follow-up plan:   Return in about 3 months (around 06/08/2023) for Medication Management, in person.    Recent Visits Date Type Provider Dept  12/09/22 Office Visit Edward Jolly, MD [facility]  Showing recent visits within past 90 days and meeting all other requirements Today's Visits Date Type Provider  Dept  03/08/23 Office Visit Edward Jolly, MD [facility]  Showing today's visits and meeting all other requirements Future Appointments No visits were found meeting these conditions. Showing future appointments within next 90 days and meeting all other requirements  I discussed the assessment and treatment plan with the patient. The patient was provided an opportunity to ask questions and all were answered. The patient agreed with the plan and demonstrated an understanding of the instructions.  Patient advised to call back or seek an in-person evaluation if the symptoms or condition worsens.  Duration of encounter: .  Total time on encounter, as per AMA guidelines included both the face-to-face and non-face-to-face time personally spent by the physician and/or other qualified health care professional(s) on the day of the encounter (includes time in activities that require the physician or other qualified health care professional and does not include time in activities normally performed by clinical staff). Physician's time may include the following activities when performed: preparing to see the patient (eg, review of tests, pre-charting review of records) obtaining and/or reviewing separately obtained history performing a medically appropriate examination and/or evaluation counseling and educating the patient/family/caregiver ordering medications, tests, or procedures referring and communicating with other health care professionals (when not separately reported) documenting clinical information in the electronic or other health  record independently interpreting results (not separately reported) and communicating results to the patient/ family/caregiver care coordination (not separately reported)  Note by: Edward Jolly, MD Date: 03/08/2023; Time: 9:53 AM

## 2023-03-10 DIAGNOSIS — J432 Centrilobular emphysema: Secondary | ICD-10-CM | POA: Diagnosis not present

## 2023-03-10 DIAGNOSIS — G4733 Obstructive sleep apnea (adult) (pediatric): Secondary | ICD-10-CM | POA: Diagnosis not present

## 2023-03-10 DIAGNOSIS — R0902 Hypoxemia: Secondary | ICD-10-CM | POA: Diagnosis not present

## 2023-03-10 DIAGNOSIS — I270 Primary pulmonary hypertension: Secondary | ICD-10-CM | POA: Diagnosis not present

## 2023-03-10 DIAGNOSIS — C211 Malignant neoplasm of anal canal: Secondary | ICD-10-CM | POA: Diagnosis not present

## 2023-03-15 ENCOUNTER — Encounter: Payer: Medicare HMO | Admitting: Student in an Organized Health Care Education/Training Program

## 2023-03-21 DIAGNOSIS — J449 Chronic obstructive pulmonary disease, unspecified: Secondary | ICD-10-CM | POA: Diagnosis not present

## 2023-04-01 ENCOUNTER — Other Ambulatory Visit: Payer: Self-pay | Admitting: Family Medicine

## 2023-04-01 DIAGNOSIS — F339 Major depressive disorder, recurrent, unspecified: Secondary | ICD-10-CM

## 2023-04-10 DIAGNOSIS — C211 Malignant neoplasm of anal canal: Secondary | ICD-10-CM | POA: Diagnosis not present

## 2023-04-10 DIAGNOSIS — I270 Primary pulmonary hypertension: Secondary | ICD-10-CM | POA: Diagnosis not present

## 2023-04-10 DIAGNOSIS — R0902 Hypoxemia: Secondary | ICD-10-CM | POA: Diagnosis not present

## 2023-04-10 DIAGNOSIS — J432 Centrilobular emphysema: Secondary | ICD-10-CM | POA: Diagnosis not present

## 2023-04-10 DIAGNOSIS — G4733 Obstructive sleep apnea (adult) (pediatric): Secondary | ICD-10-CM | POA: Diagnosis not present

## 2023-04-13 DIAGNOSIS — E119 Type 2 diabetes mellitus without complications: Secondary | ICD-10-CM | POA: Diagnosis not present

## 2023-04-13 DIAGNOSIS — R1114 Bilious vomiting: Secondary | ICD-10-CM | POA: Diagnosis not present

## 2023-04-13 DIAGNOSIS — J439 Emphysema, unspecified: Secondary | ICD-10-CM | POA: Diagnosis not present

## 2023-04-13 DIAGNOSIS — K219 Gastro-esophageal reflux disease without esophagitis: Secondary | ICD-10-CM | POA: Diagnosis not present

## 2023-04-13 DIAGNOSIS — R1319 Other dysphagia: Secondary | ICD-10-CM | POA: Diagnosis not present

## 2023-04-13 DIAGNOSIS — I10 Essential (primary) hypertension: Secondary | ICD-10-CM | POA: Diagnosis not present

## 2023-04-13 DIAGNOSIS — R1314 Dysphagia, pharyngoesophageal phase: Secondary | ICD-10-CM | POA: Diagnosis not present

## 2023-04-13 DIAGNOSIS — Z882 Allergy status to sulfonamides status: Secondary | ICD-10-CM | POA: Diagnosis not present

## 2023-04-14 DIAGNOSIS — H353133 Nonexudative age-related macular degeneration, bilateral, advanced atrophic without subfoveal involvement: Secondary | ICD-10-CM | POA: Diagnosis not present

## 2023-04-28 ENCOUNTER — Other Ambulatory Visit: Payer: Self-pay | Admitting: Family Medicine

## 2023-04-28 DIAGNOSIS — F339 Major depressive disorder, recurrent, unspecified: Secondary | ICD-10-CM

## 2023-04-30 ENCOUNTER — Other Ambulatory Visit: Payer: Self-pay | Admitting: Oncology

## 2023-05-03 DIAGNOSIS — R0602 Shortness of breath: Secondary | ICD-10-CM | POA: Diagnosis not present

## 2023-05-03 DIAGNOSIS — A419 Sepsis, unspecified organism: Secondary | ICD-10-CM | POA: Diagnosis not present

## 2023-05-03 DIAGNOSIS — R531 Weakness: Secondary | ICD-10-CM | POA: Diagnosis not present

## 2023-05-03 DIAGNOSIS — R2681 Unsteadiness on feet: Secondary | ICD-10-CM | POA: Diagnosis not present

## 2023-05-03 DIAGNOSIS — R110 Nausea: Secondary | ICD-10-CM | POA: Diagnosis not present

## 2023-05-03 DIAGNOSIS — R059 Cough, unspecified: Secondary | ICD-10-CM | POA: Diagnosis not present

## 2023-05-03 DIAGNOSIS — J9811 Atelectasis: Secondary | ICD-10-CM | POA: Diagnosis not present

## 2023-05-03 DIAGNOSIS — R509 Fever, unspecified: Secondary | ICD-10-CM | POA: Diagnosis not present

## 2023-05-04 DIAGNOSIS — J449 Chronic obstructive pulmonary disease, unspecified: Secondary | ICD-10-CM | POA: Diagnosis not present

## 2023-05-04 DIAGNOSIS — R509 Fever, unspecified: Secondary | ICD-10-CM | POA: Diagnosis not present

## 2023-05-04 DIAGNOSIS — R0902 Hypoxemia: Secondary | ICD-10-CM | POA: Diagnosis not present

## 2023-05-04 DIAGNOSIS — I272 Pulmonary hypertension, unspecified: Secondary | ICD-10-CM | POA: Diagnosis not present

## 2023-05-04 DIAGNOSIS — E876 Hypokalemia: Secondary | ICD-10-CM | POA: Diagnosis not present

## 2023-05-04 DIAGNOSIS — R6510 Systemic inflammatory response syndrome (SIRS) of non-infectious origin without acute organ dysfunction: Secondary | ICD-10-CM | POA: Diagnosis not present

## 2023-05-05 DIAGNOSIS — G4733 Obstructive sleep apnea (adult) (pediatric): Secondary | ICD-10-CM | POA: Diagnosis not present

## 2023-05-05 DIAGNOSIS — R509 Fever, unspecified: Secondary | ICD-10-CM | POA: Diagnosis not present

## 2023-05-05 DIAGNOSIS — J449 Chronic obstructive pulmonary disease, unspecified: Secondary | ICD-10-CM | POA: Diagnosis not present

## 2023-05-05 DIAGNOSIS — M5412 Radiculopathy, cervical region: Secondary | ICD-10-CM | POA: Diagnosis not present

## 2023-05-05 DIAGNOSIS — J9621 Acute and chronic respiratory failure with hypoxia: Secondary | ICD-10-CM | POA: Diagnosis not present

## 2023-05-10 DIAGNOSIS — R0902 Hypoxemia: Secondary | ICD-10-CM | POA: Diagnosis not present

## 2023-05-10 DIAGNOSIS — J432 Centrilobular emphysema: Secondary | ICD-10-CM | POA: Diagnosis not present

## 2023-05-10 DIAGNOSIS — I270 Primary pulmonary hypertension: Secondary | ICD-10-CM | POA: Diagnosis not present

## 2023-05-10 DIAGNOSIS — G4733 Obstructive sleep apnea (adult) (pediatric): Secondary | ICD-10-CM | POA: Diagnosis not present

## 2023-05-10 DIAGNOSIS — C211 Malignant neoplasm of anal canal: Secondary | ICD-10-CM | POA: Diagnosis not present

## 2023-05-11 DIAGNOSIS — I071 Rheumatic tricuspid insufficiency: Secondary | ICD-10-CM | POA: Diagnosis not present

## 2023-05-11 DIAGNOSIS — Z9981 Dependence on supplemental oxygen: Secondary | ICD-10-CM | POA: Diagnosis not present

## 2023-05-11 DIAGNOSIS — J9611 Chronic respiratory failure with hypoxia: Secondary | ICD-10-CM | POA: Diagnosis not present

## 2023-05-11 DIAGNOSIS — I272 Pulmonary hypertension, unspecified: Secondary | ICD-10-CM | POA: Diagnosis not present

## 2023-05-11 DIAGNOSIS — R0902 Hypoxemia: Secondary | ICD-10-CM | POA: Diagnosis not present

## 2023-05-13 NOTE — Progress Notes (Unsigned)
Name: Judy Allen   MRN: [id number]    DOB: 1956/01/01   Date:05/16/2023       Progress Note  Subjective  Chief Complaint  Follow Up  HPI  History of anal cancer: patient was diagnosed with anal cancer in 2017, biopsy done by Dr. Evette Cristal and treated with radiation at [facility]. She has been released from [facility] . Unchanged   Breast cancer - left side: found by abnormal mammogram and biopsy positive 07/05/2022 , invasive mammary carcinoma. She did not have radiation only taking Arimidex for 5 years . She has mammogram already scheduled   MDD: long history of depression, currently on Effexor 225 mg daily , she used to take abilify but it caused tremors.after that we tried Leafy Kindle but also caused tremors once she went up on the dose, last visit she asked to go back on Vraylar and stay on lower dose but she developed tremors again and states no longer working so she stopped taking Vraylar and Buspar, explained importance of seeing psychiatrist  Fever/sepsis: admitted to [facility] 09/24  and discharged on 09/26, no changed in medications. She also had hypoxia. I did not see a clinical summary . She is feeling tired but no fever since discharge   Hypertriglyceridemiareviewed last labs done in our office was 07/2023 LDL was up from 70 to  103 . Currently on  Lovaza and Gemfibrozil , denies side effects. We resumed Crestor and we will recheck labs today, she states she is fasting    HTN/Paroxysmal tachycardia: .she sees Dr. Austin Miles, she states she is still taking Telmisartan 80 mg , NIfedipine , due to anxiety we will add atenolol to take half pill bid    Hyperparathyroidism: diagnosed years ago and used to see Dr. Tedd Sias. She has been off HCTZ and last parathyroid test and vitamin D back to normal. We will recheck levels today    Emphysema and hypoxemia/pulmonary hypertension: on oxygen 2  liters during the day and 4 liters when sleeping . She continues to have sob is stable and only with activity, denies current  cough or wheezing  . Under the care of pulmonologist at [facility]. She had a repeat Echo recently .  Summary 05/11/2023  1. The left ventricle is normal in size with normal wall thickness.    2. The left ventricular systolic function is normal, LVEF is visually  estimated at > 55%.    3. The mitral valve leaflets are mildly thickened with normal leaflet  mobility.   4. The aortic valve is trileaflet with mildly thickened leaflets with normal  excursion.   5. The right ventricle is normal in size, with normal systolic function.    6. There is borderline pulmonary hypertension.    7. No significant change since prior study from 06/2022.     DMII: diagnosed 02/2018 by Dr. Tedd Sias, hgbA1C was at 6.6%, A1C  but has been off Metformin and A1C has been within normal limits, today A1C is down to 5.6 % %  She has diabetes neuropathy - pain on both feet . She also has dyslipidemia , LDL went up and we will resume Crestor ( discontinued last year by another provider) . She is taking Gabapentin that is prescribed by Dr. Cherylann Ratel    Chronic pain: under the care of Dr. Cherylann Ratel, she states she is doing well on Gabapentin , recently starting on new pain management - of Buprenorphine film , also taking muscle relaxer prn .She has a history of laminectomy. She has  neck and back pain, pain level right now is 0/10   Senile purpura: stable on arms, reassurance given. Unchanged    Psoriasis: she is doing well, no current rashes . No longer seeing Dermatologist   Patient Active Problem List   Diagnosis Date Noted   Aromatase inhibitor use 11/24/2022   Macular degeneration 08/30/2022   Genetic testing 08/20/2022   Breast cancer (HCC) 07/26/2022   Breast cancer, left (HCC) 07/10/2022   Invasive carcinoma of breast (HCC) 07/10/2022   Goals of care, counseling/discussion 07/10/2022   Cystocele with rectocele 12/28/2021   S/P vaginal hysterectomy 12/28/2021   Bilateral hip pain 11/03/2021   Hypotension due to drugs  05/03/2021   Disorder of SI (sacroiliac) joint 11/11/2020   Arthritis of sacroiliac joint of both sides (HCC) 11/11/2020   Chronic radicular lumbar pain 11/11/2020   Hyperparathyroidism (HCC) 08/25/2019   Type 2 diabetes mellitus with obesity (HCC) 11/06/2018   Cervical radiculopathy (left) 08/31/2018   Osteoarthritis of spine with radiculopathy, cervical region 08/31/2018   History of anal cancer 05/30/2018   Myofascial pain syndrome, cervical 05/16/2018   Chronic pain syndrome 05/16/2018   Cervical facet joint syndrome 05/16/2018   Perihepatitis (HCC) 09/27/2017   Fatty liver 12/22/2016   Chronic respiratory failure with hypoxia, on home oxygen therapy (HCC) 07/28/2016   Centrilobular emphysema (HCC) 07/14/2016   Abnormal Pap smear of cervix 04/14/2016   Anal squamous cell carcinoma (HCC) 03/09/2016   Non-thrombocytopenic purpura (HCC) 01/12/2016   Chronic constipation 10/16/2015   History of fusion of cervical spine 04/02/2015   Benign hypertension 01/16/2015   Chronic cervical pain 01/16/2015   Constipation 01/16/2015   Gastric reflux 01/16/2015   Hypertriglyceridemia 01/16/2015   Edema leg 01/16/2015   Chronic recurrent major depressive disorder (HCC) 01/16/2015   Dysmetabolic syndrome 01/16/2015   Obstructive apnea 01/16/2015   Psoriasis 01/16/2015   Allergic rhinitis 01/16/2015   Bursitis, trochanteric 01/16/2015   GAD (generalized anxiety disorder) 01/16/2015   Tachycardia, paroxysmal (HCC)     Past Surgical History:  Procedure Laterality Date   ANTERIOR AND POSTERIOR REPAIR N/A 12/28/2021   Procedure: ANTERIOR (CYSTOCELE) AND POSTERIOR REPAIR (RECTOCELE);  Surgeon: Hildred Laser, MD;  Location: [facility];  Service: Gynecology;  Laterality: N/A;   ANTERIOR CERVICAL DECOMP/DISCECTOMY FUSION N/A 2001   PROCEDURE: ACDF C4-C7   ANTERIOR FUSION LUMBAR SPINE  1990   plate in back, not metal   BREAST BIOPSY Left 07/05/2022   [place] Bx, Ribbon Clip, Path Pending   BREAST  BIOPSY Left 07/05/2022   [place] LT BREAST BX W LOC DEV 1ST LESION IMG BX SPEC US GUIDE 07/05/2022 [facility]   BREAST BIOPSY Left 07/22/2022   [place] LT RADIO FREQUENCY TAG LOC US GUIDE 07/22/2022 [facility]   CARDIAC CATHETERIZATION  2009?    [facility]   CARDIAC CATHETERIZATION  05/2012   RHC: Mild hypertension 37/12 with a mean pressure of 21 mm mercury   CHOLECYSTECTOMY N/A 09/12/2017   Procedure: LAPAROSCOPIC CHOLECYSTECTOMY WITH INTRAOPERATIVE CHOLANGIOGRAM;  Surgeon: Earline Mayotte, MD;  Location: [facility];  Service: General;  Laterality: N/A;   COLONOSCOPY  2018   LIVER BIOPSY N/A 09/12/2017   Procedure: LIVER BIOPSY;  Surgeon: Earline Mayotte, MD;  Location: [facility];  Service: General;  Laterality: N/A;   MASS EXCISION Left 02/23/2016   Procedure: EXCISION MASS;  Surgeon: Kieth Brightly, MD;  Location: [facility];  Service: General;  Laterality: Left;   PART MASTECTOMY,RADIO FREQUENCY LOCALIZER,AXILLARY SENTINEL NODE BIOPSY Left 07/26/2022   Procedure:  PART MASTECTOMY,RADIO FREQUENCY LOCALIZER,AXILLARY SENTINEL NODE BIOPSY;  Surgeon: Carolan Shiver, MD;  Location: [facility];  Service: General;  Laterality: Left;   PORT-A-CATH REMOVAL  2018   PORTACATH PLACEMENT Left 03/12/2016   Procedure: INSERTION PORT-A-CATH;  Surgeon: Kieth Brightly, MD;  Location: [facility];  Service: General;  Laterality: Left;   TONSILLECTOMY Bilateral 1959   TRIGGER FINGER RELEASE Right 09/2018   Dr. Hyacinth Meeker    TUBAL LIGATION     s/p   VAGINAL HYSTERECTOMY N/A 12/28/2021   Procedure: HYSTERECTOMY VAGINAL;  Surgeon: Hildred Laser, MD;  Location: [facility];  Service: Gynecology;  Laterality: N/A;    Family History  Problem Relation Age of Onset   Heart attack Mother    Asthma Mother    Liver cancer Father 6   Cancer Brother        unk type; metastatic   Breast cancer Maternal Aunt        dx 20s-30s   Breast cancer Cousin        dx 30s   Bladder Cancer Neg Hx    Kidney  cancer Neg Hx    Colon cancer Neg Hx     Social History   Tobacco Use   Smoking status: Former    Current packs/day: 0.00    Average packs/day: 1 pack/day for 42.4 years (42.4 ttl pk-yrs)    Types: Cigarettes    Start date: 08/10/1963    Quit date: 01/15/2006    Years since quitting: 17.3   Smokeless tobacco: Never  Substance Use Topics   Alcohol use: No     Current Outpatient Medications:    albuterol (PROVENTIL HFA;VENTOLIN HFA) 108 (90 Base) MCG/ACT inhaler, Inhale 2 puffs into the lungs every 6 (six) hours as needed for wheezing or shortness of breath., Disp: , Rfl:    anastrozole (ARIMIDEX) 1 MG tablet, TAKE 1 TABLET DAILY, Disp: 90 tablet, Rfl: 1   atenolol (TENORMIN) 25 MG tablet, Take 0.5 tablets (12.5 mg total) by mouth 2 (two) times daily., Disp: 90 tablet, Rfl: 0   Cholecalciferol (VITAMIN D3) 1000 units CAPS, Take 1 capsule by mouth daily., Disp: , Rfl:    cyclobenzaprine (FLEXERIL) 5 MG tablet, Take 1 tablet (5 mg total) by mouth 3 (three) times daily as needed for muscle spasms., Disp: 90 tablet, Rfl: 2   gabapentin (NEURONTIN) 400 MG capsule, Take 1 capsule (400 mg total) by mouth 3 (three) times daily., Disp: 90 capsule, Rfl: 5   HYDROcodone-acetaminophen (NORCO) 10-325 MG tablet, Take 1 tablet by mouth every 8 (eight) hours as needed for severe pain. Must last 30 days., Disp: 90 tablet, Rfl: 0   metoprolol succinate (TOPROL-XL) 50 MG 24 hr tablet, Take 50 mg by mouth in the morning., Disp: , Rfl:    Multiple Vitamins-Minerals (EYE VITAMINS & MINERALS PO), Take 1 capsule by mouth in the morning. [employer] Eye Vitamins, Disp: , Rfl:    NIFEdipine (PROCARDIA-XL/NIFEDICAL-XL) 30 MG 24 hr tablet, Take 30 mg by mouth at bedtime., Disp: , Rfl:    omeprazole (PRILOSEC) 40 MG capsule, TAKE 1 CAPSULE DAILY, Disp: 90 capsule, Rfl: 3   OXYGEN, Inhale 2-4 L into the lungs continuous. 2 L qd and 4 L qhs, Disp: , Rfl:    Potassium Gluconate 80 MG TABS, Take 1 tablet by mouth daily.,  Disp: , Rfl:    telmisartan (MICARDIS) 80 MG tablet, Take 80 mg by mouth daily., Disp: , Rfl:    torsemide (DEMADEX) 5 MG tablet, Take 5  mg by mouth every morning., Disp: , Rfl:    umeclidinium-vilanterol (ANORO ELLIPTA) 62.5-25 MCG/ACT AEPB, Inhale 1 puff into the lungs in the morning., Disp: , Rfl:    omega-3 acid ethyl esters (LOVAZA) 1 g capsule, TAKE 2 CAPSULES BY MOUTH TWO TIMES A DAY, Disp: 360 capsule, Rfl: 1   rosuvastatin (CRESTOR) 10 MG tablet, Take 1 tablet (10 mg total) by mouth daily., Disp: 90 tablet, Rfl: 1   venlafaxine XR (EFFEXOR-XR) 75 MG 24 hr capsule, Take 1 capsule (75 mg total) by mouth 3 (three) times daily., Disp: 270 capsule, Rfl: 1  Allergies  Allergen Reactions   Aczone [Dapsone] Other (See Comments)    Hypoxemia   Abilify [Aripiprazole]     Tardive dyskinesis    Atarax [Hydroxyzine]     Hypoxemia    Hydrochlorothiazide     History of hyperparathyroidism    Sulfa Antibiotics Rash   Sulfasalazine Rash    I personally reviewed active problem list, medication list, allergies, family history, social history, health maintenance with the patient/caregiver today.   ROS  Ten systems reviewed and is negative except as mentioned in HPI    Objective  Vitals:   05/16/23 1441  BP: 124/68  Pulse: 81  Resp: 16  SpO2: 96%  Weight: 184 lb (83.5 kg)  Height: 5\' 3"  (1.6 m)    Body mass index is 32.59 kg/m.  Physical Exam  Constitutional: Patient appears pale  and well-nourished. Obese  No distress.  HEENT: head atraumatic, normocephalic, pupils equal and reactive to light, neck supple, throat within normal limits Cardiovascular: Normal rate, regular rhythm and normal heart sounds.  No murmur heard. Trace BLE edema. Pulmonary/Chest: Effort normal and breath sounds normal. Nasal cannula oxygen  Abdominal: Soft.  There is no tenderness. Psychiatric: Patient has a normal mood and affect. behavior is normal. Judgment and thought content normal.     PHQ2/9:    05/16/2023    2:41 PM 12/31/2022    3:48 PM 12/09/2022    2:02 PM 09/13/2022    1:09 PM 08/30/2022   11:21 AM  Depression screen PHQ 2/9  Decreased Interest 0 2 0 0 1  Down, Depressed, Hopeless 0 2 0 0 1  PHQ - 2 Score 0 4 0 0 2  Altered sleeping 0 0   3  Tired, decreased energy 0 2   3  Change in appetite 0 3   0  Feeling bad or failure about yourself  0 1   0  Trouble concentrating 0 0   0  Moving slowly or fidgety/restless 0 0   0  Suicidal thoughts 0 0   0  PHQ-9 Score 0 10   8  Difficult doing work/chores  Not difficult at all   Somewhat difficult    phq 9 is negative - but she states it is how she felt today, but was really down last week / not accurate   Fall Risk:    05/16/2023    2:41 PM 03/08/2023    9:31 AM 12/31/2022    3:48 PM 12/09/2022    2:02 PM 09/13/2022    1:09 PM  Fall Risk   Falls in the past year? 0 0 0 0 0  Number falls in past yr: 0   0   Injury with Fall? 0   0   Risk for fall due to : No Fall Risks  No Fall Risks History of fall(s)   Follow up Falls prevention discussed  Falls  prevention discussed;Education provided;Falls evaluation completed        Functional Status Survey: Is the patient deaf or have difficulty hearing?: No Does the patient have difficulty seeing, even when wearing glasses/contacts?: No Does the patient have difficulty concentrating, remembering, or making decisions?: No Does the patient have difficulty walking or climbing stairs?: No Does the patient have difficulty dressing or bathing?: No Does the patient have difficulty doing errands alone such as visiting a doctor's office or shopping?: No    Assessment & Plan  1. Dyslipidemia associated with type 2 diabetes mellitus (HCC)  - POCT HgB A1C - Urine Microalbumin w/creat. ratio - COMPLETE METABOLIC PANEL WITH GFR - Lipid panel - rosuvastatin (CRESTOR) 10 MG tablet; Take 1 tablet (10 mg total) by mouth daily.  Dispense: 90 tablet; Refill: 1 - omega-3 acid  ethyl esters (LOVAZA) 1 g capsule; TAKE 2 CAPSULES BY MOUTH TWO TIMES A DAY  Dispense: 360 capsule; Refill: 1  2. Senile purpura (HCC)  Reassurance given   3. Chronic respiratory failure with hypoxia, on home oxygen therapy (HCC)  stable  4. Hyperparathyroidism (HCC)  - Parathyroid hormone, intact (no Ca)  5. Centrilobular emphysema (HCC)  Stable   6. Major depression, recurrent, chronic (HCC)  - venlafaxine XR (EFFEXOR-XR) 75 MG 24 hr capsule; Take 1 capsule (75 mg total) by mouth 3 (three) times daily.  Dispense: 270 capsule; Refill: 1 - Ambulatory referral to Psychiatry  7. Pulmonary hypertension (HCC)  Under the care of pulmonologist   8. Elevated C-reactive protein (CRP)  - C-reactive protein  9. Anemia, unspecified type  - CBC with Differential/Platelet - Iron, TIBC and Ferritin Panel  10. Benign hypertension  - atenolol (TENORMIN) 25 MG tablet; Take 0.5 tablets (12.5 mg total) by mouth 2 (two) times daily.  Dispense: 90 tablet; Refill: 0  11. GAD (generalized anxiety disorder)  - Ambulatory referral to Psychiatry

## 2023-05-16 ENCOUNTER — Encounter: Payer: Self-pay | Admitting: Family Medicine

## 2023-05-16 ENCOUNTER — Ambulatory Visit (INDEPENDENT_AMBULATORY_CARE_PROVIDER_SITE_OTHER): Payer: Medicare HMO | Admitting: Family Medicine

## 2023-05-16 VITALS — BP 124/68 | HR 81 | Resp 16 | Ht 63.0 in | Wt 184.0 lb

## 2023-05-16 DIAGNOSIS — J432 Centrilobular emphysema: Secondary | ICD-10-CM | POA: Diagnosis not present

## 2023-05-16 DIAGNOSIS — D692 Other nonthrombocytopenic purpura: Secondary | ICD-10-CM

## 2023-05-16 DIAGNOSIS — E785 Hyperlipidemia, unspecified: Secondary | ICD-10-CM | POA: Diagnosis not present

## 2023-05-16 DIAGNOSIS — F339 Major depressive disorder, recurrent, unspecified: Secondary | ICD-10-CM | POA: Diagnosis not present

## 2023-05-16 DIAGNOSIS — F411 Generalized anxiety disorder: Secondary | ICD-10-CM

## 2023-05-16 DIAGNOSIS — I272 Pulmonary hypertension, unspecified: Secondary | ICD-10-CM | POA: Diagnosis not present

## 2023-05-16 DIAGNOSIS — J9611 Chronic respiratory failure with hypoxia: Secondary | ICD-10-CM | POA: Diagnosis not present

## 2023-05-16 DIAGNOSIS — E213 Hyperparathyroidism, unspecified: Secondary | ICD-10-CM | POA: Diagnosis not present

## 2023-05-16 DIAGNOSIS — D649 Anemia, unspecified: Secondary | ICD-10-CM | POA: Diagnosis not present

## 2023-05-16 DIAGNOSIS — R7982 Elevated C-reactive protein (CRP): Secondary | ICD-10-CM

## 2023-05-16 DIAGNOSIS — E1169 Type 2 diabetes mellitus with other specified complication: Secondary | ICD-10-CM

## 2023-05-16 DIAGNOSIS — I10 Essential (primary) hypertension: Secondary | ICD-10-CM | POA: Diagnosis not present

## 2023-05-16 LAB — POCT GLYCOSYLATED HEMOGLOBIN (HGB A1C): Hemoglobin A1C: 5.6 % (ref 4.0–5.6)

## 2023-05-16 MED ORDER — VENLAFAXINE HCL ER 75 MG PO CP24: 75.0000 mg | ORAL_CAPSULE | Freq: Three times a day (TID) | ORAL | 1 refills | Status: AC

## 2023-05-16 MED ORDER — OMEGA-3-ACID ETHYL ESTERS 1 G PO CAPS: ORAL_CAPSULE | ORAL | 1 refills | Status: DC

## 2023-05-16 MED ORDER — ROSUVASTATIN CALCIUM 10 MG PO TABS
10.0000 mg | ORAL_TABLET | Freq: Every day | ORAL | 1 refills | Status: DC
Start: 2023-05-16 — End: 2023-11-03

## 2023-05-16 MED ORDER — ATENOLOL 25 MG PO TABS
12.5000 mg | ORAL_TABLET | Freq: Two times a day (BID) | ORAL | 0 refills | Status: DC
Start: 2023-05-16 — End: 2023-11-03

## 2023-05-17 LAB — COMPLETE METABOLIC PANEL WITH GFR
AG Ratio: 1.6 (calc) (ref 1.0–2.5)
ALT: 32 U/L — ABNORMAL HIGH (ref 6–29)
AST: 38 U/L — ABNORMAL HIGH (ref 10–35)
Albumin: 5.1 g/dL (ref 3.6–5.1)
Alkaline phosphatase (APISO): 153 U/L (ref 37–153)
BUN: 15 mg/dL (ref 7–25)
CO2: 30 mmol/L (ref 20–32)
Calcium: 10.3 mg/dL (ref 8.6–10.4)
Chloride: 100 mmol/L (ref 98–110)
Creat: 0.83 mg/dL (ref 0.50–1.05)
Globulin: 3.1 g/dL (ref 1.9–3.7)
Glucose, Bld: 97 mg/dL (ref 65–99)
Potassium: 4.1 mmol/L (ref 3.5–5.3)
Sodium: 142 mmol/L (ref 135–146)
Total Bilirubin: 0.4 mg/dL (ref 0.2–1.2)
Total Protein: 8.2 g/dL — ABNORMAL HIGH (ref 6.1–8.1)
eGFR: 77 mL/min/{1.73_m2} (ref >=60)

## 2023-05-17 LAB — CBC WITH DIFFERENTIAL/PLATELET
Absolute Monocytes: 599 {cells}/uL (ref 200–950)
Basophils Absolute: 68 {cells}/uL (ref 0–200)
Basophils Relative: 0.6 %
Eosinophils Absolute: 294 {cells}/uL (ref 15–500)
Eosinophils Relative: 2.6 %
HCT: 35.9 % (ref 35.0–45.0)
Hemoglobin: 11.6 g/dL — ABNORMAL LOW (ref 11.7–15.5)
Lymphs Abs: 1763 {cells}/uL (ref 850–3900)
MCH: 29.5 pg (ref 27.0–33.0)
MCHC: 32.3 g/dL (ref 32.0–36.0)
MCV: 91.3 fL (ref 80.0–100.0)
MPV: 9.2 fL (ref 7.5–12.5)
Monocytes Relative: 5.3 %
Neutro Abs: 8577 {cells}/uL — ABNORMAL HIGH (ref 1500–7800)
Neutrophils Relative %: 75.9 %
Platelets: 357 10*3/uL (ref 140–400)
RBC: 3.93 10*6/uL (ref 3.80–5.10)
RDW: 13.5 % (ref 11.0–15.0)
Total Lymphocyte: 15.6 %
WBC: 11.3 10*3/uL — ABNORMAL HIGH (ref 3.8–10.8)

## 2023-05-17 LAB — LIPID PANEL
Cholesterol: 90 mg/dL (ref <200)
HDL: 38 mg/dL — ABNORMAL LOW (ref >=50)
LDL Cholesterol (Calc): 25 mg/dL
Non-HDL Cholesterol (Calc): 52 mg/dL (ref <130)
Total CHOL/HDL Ratio: 2.4 (calc) (ref <5.0)
Triglycerides: 207 mg/dL — ABNORMAL HIGH (ref <150)

## 2023-05-17 LAB — IRON,TIBC AND FERRITIN PANEL
%SAT: 11 % — ABNORMAL LOW (ref 16–45)
Ferritin: 63 ng/mL (ref 16–288)
Iron: 47 ug/dL (ref 45–160)
TIBC: 444 ug/dL (ref 250–450)

## 2023-05-17 LAB — PARATHYROID HORMONE, INTACT (NO CA): PTH: 100 pg/mL — ABNORMAL HIGH (ref 16–77)

## 2023-05-17 LAB — C-REACTIVE PROTEIN: CRP: 30.4 mg/L — ABNORMAL HIGH (ref <8.0)

## 2023-05-17 LAB — MICROALBUMIN / CREATININE URINE RATIO
Creatinine, Urine: 127 mg/dL (ref 20–275)
Microalb Creat Ratio: 41 mg/g{creat} — ABNORMAL HIGH (ref <30)
Microalb, Ur: 5.2 mg/dL

## 2023-05-18 ENCOUNTER — Other Ambulatory Visit: Payer: Self-pay | Admitting: Family Medicine

## 2023-05-18 DIAGNOSIS — R7982 Elevated C-reactive protein (CRP): Secondary | ICD-10-CM

## 2023-05-18 DIAGNOSIS — D72829 Elevated white blood cell count, unspecified: Secondary | ICD-10-CM

## 2023-05-19 ENCOUNTER — Other Ambulatory Visit: Payer: Self-pay

## 2023-05-20 DIAGNOSIS — R7982 Elevated C-reactive protein (CRP): Secondary | ICD-10-CM | POA: Diagnosis not present

## 2023-05-20 DIAGNOSIS — D72829 Elevated white blood cell count, unspecified: Secondary | ICD-10-CM | POA: Diagnosis not present

## 2023-05-21 LAB — CBC WITH DIFFERENTIAL/PLATELET
Absolute Monocytes: 701 {cells}/uL (ref 200–950)
Basophils Absolute: 96 {cells}/uL (ref 0–200)
Basophils Relative: 1 %
Eosinophils Absolute: 317 {cells}/uL (ref 15–500)
Eosinophils Relative: 3.3 %
HCT: 36.5 % (ref 35.0–45.0)
Hemoglobin: 11.5 g/dL — ABNORMAL LOW (ref 11.7–15.5)
Lymphs Abs: 1930 {cells}/uL (ref 850–3900)
MCH: 29 pg (ref 27.0–33.0)
MCHC: 31.5 g/dL — ABNORMAL LOW (ref 32.0–36.0)
MCV: 92.2 fL (ref 80.0–100.0)
MPV: 9.3 fL (ref 7.5–12.5)
Monocytes Relative: 7.3 %
Neutro Abs: 6557 {cells}/uL (ref 1500–7800)
Neutrophils Relative %: 68.3 %
Platelets: 337 10*3/uL (ref 140–400)
RBC: 3.96 10*6/uL (ref 3.80–5.10)
RDW: 13.2 % (ref 11.0–15.0)
Total Lymphocyte: 20.1 %
WBC: 9.6 10*3/uL (ref 3.8–10.8)

## 2023-05-21 LAB — C-REACTIVE PROTEIN: CRP: 25.5 mg/L — ABNORMAL HIGH (ref <8.0)

## 2023-05-27 ENCOUNTER — Other Ambulatory Visit: Payer: Self-pay | Admitting: Student in an Organized Health Care Education/Training Program

## 2023-05-27 DIAGNOSIS — M5416 Radiculopathy, lumbar region: Secondary | ICD-10-CM

## 2023-05-27 DIAGNOSIS — G8929 Other chronic pain: Secondary | ICD-10-CM

## 2023-05-27 DIAGNOSIS — M5412 Radiculopathy, cervical region: Secondary | ICD-10-CM

## 2023-05-27 DIAGNOSIS — G894 Chronic pain syndrome: Secondary | ICD-10-CM

## 2023-05-30 ENCOUNTER — Ambulatory Visit
Admission: RE | Admit: 2023-05-30 | Discharge: 2023-05-30 | Disposition: A | Discharge status: Home / Self Care | Payer: Medicare HMO | Source: Ambulatory Visit | Attending: Oncology | Admitting: Oncology

## 2023-05-30 DIAGNOSIS — C50919 Malignant neoplasm of unspecified site of unspecified female breast: Secondary | ICD-10-CM | POA: Insufficient documentation

## 2023-05-30 DIAGNOSIS — R928 Other abnormal and inconclusive findings on diagnostic imaging of breast: Secondary | ICD-10-CM | POA: Insufficient documentation

## 2023-05-30 DIAGNOSIS — C50912 Malignant neoplasm of unspecified site of left female breast: Secondary | ICD-10-CM | POA: Diagnosis not present

## 2023-05-30 DIAGNOSIS — R92323 Mammographic fibroglandular density, bilateral breasts: Secondary | ICD-10-CM | POA: Diagnosis not present

## 2023-05-30 DIAGNOSIS — R590 Localized enlarged lymph nodes: Secondary | ICD-10-CM | POA: Diagnosis not present

## 2023-05-30 DIAGNOSIS — Z9889 Other specified postprocedural states: Secondary | ICD-10-CM | POA: Insufficient documentation

## 2023-05-30 DIAGNOSIS — N6321 Unspecified lump in the left breast, upper outer quadrant: Secondary | ICD-10-CM | POA: Diagnosis not present

## 2023-05-31 ENCOUNTER — Ambulatory Visit
Payer: Medicare HMO | Attending: Student in an Organized Health Care Education/Training Program | Admitting: Student in an Organized Health Care Education/Training Program

## 2023-05-31 ENCOUNTER — Other Ambulatory Visit: Payer: Self-pay | Admitting: Oncology

## 2023-05-31 ENCOUNTER — Encounter: Payer: Self-pay | Admitting: Student in an Organized Health Care Education/Training Program

## 2023-05-31 VITALS — BP 122/76 | HR 76 | Temp 97.3°F | Ht 64.0 in | Wt 184.0 lb

## 2023-05-31 DIAGNOSIS — G894 Chronic pain syndrome: Secondary | ICD-10-CM | POA: Diagnosis not present

## 2023-05-31 DIAGNOSIS — M5412 Radiculopathy, cervical region: Secondary | ICD-10-CM

## 2023-05-31 DIAGNOSIS — G8929 Other chronic pain: Secondary | ICD-10-CM

## 2023-05-31 DIAGNOSIS — M542 Cervicalgia: Secondary | ICD-10-CM | POA: Insufficient documentation

## 2023-05-31 DIAGNOSIS — R928 Other abnormal and inconclusive findings on diagnostic imaging of breast: Secondary | ICD-10-CM

## 2023-05-31 DIAGNOSIS — M5416 Radiculopathy, lumbar region: Secondary | ICD-10-CM

## 2023-05-31 DIAGNOSIS — R599 Enlarged lymph nodes, unspecified: Secondary | ICD-10-CM

## 2023-05-31 MED ORDER — CYCLOBENZAPRINE HCL 5 MG PO TABS
5.0000 mg | ORAL_TABLET | Freq: Three times a day (TID) | ORAL | 5 refills | Status: DC | PRN
Start: 2023-05-31 — End: 2024-02-28

## 2023-05-31 MED ORDER — GABAPENTIN 400 MG PO CAPS: 400.0000 mg | ORAL_CAPSULE | Freq: Three times a day (TID) | ORAL | 5 refills | Status: DC

## 2023-05-31 MED ORDER — HYDROCODONE-ACETAMINOPHEN 10-325 MG PO TABS: 1.0000 | ORAL_TABLET | Freq: Three times a day (TID) | ORAL | 0 refills | Status: AC | PRN

## 2023-05-31 MED ORDER — HYDROCODONE-ACETAMINOPHEN 10-325 MG PO TABS: 1.0000 | ORAL_TABLET | Freq: Three times a day (TID) | ORAL | 0 refills | Status: DC | PRN

## 2023-05-31 NOTE — Progress Notes (Signed)
PROVIDER NOTE: Information contained herein reflects review and annotations entered in association with encounter. Interpretation of such information and data should be left to medically-trained personnel. Information provided to patient can be located elsewhere in the medical record under "Patient Instructions". Document created using STT-dictation technology, any transcriptional errors that may result from process are unintentional.    Patient: Judy Allen  Service Category: E/M  Provider: Edward Jolly, MD  DOB: 1956-05-30  DOS: 05/31/2023  Referring Provider: Alba Cory, MD  MRN: [id number]  Specialty: Interventional Pain Management  PCP: Alba Cory, MD  Type: Established Patient  Setting: Ambulatory outpatient    Location: Office  Delivery: Face-to-face     HPI  [name], a 67 y.o. year old female, is here today because of her Chronic pain syndrome [G89.4]. Ms. Schacher primary complain today is Neck Pain Last encounter: My last encounter with her was on 03/08/23 Pertinent problems: Ms. Dlugos has Chronic recurrent major depressive disorder (HCC); GAD (generalized anxiety disorder); Myofascial pain syndrome, cervical; Chronic pain syndrome; Cervical facet joint syndrome; Cervical radiculopathy (left); and Osteoarthritis of spine with radiculopathy, cervical region on their pertinent problem list. Pain Assessment: Severity of Chronic pain is reported as a 3 /10. Location: Neck Left/pain radiaties down her left arm to her fingers. Onset: More than a month ago. Quality: Tingling, Aching, Burning, Constant. Timing: Constant. Modifying factor(s): Meds. Vitals:  height is 5\' 4"  (1.626 m) and weight is 184 lb (83.5 kg). Her temperature is 97.3 F (36.3 C) (abnormal). Her blood pressure is 122/76 and her pulse is 76. Her oxygen saturation is 85% (abnormal).   Reason for encounter: medication management. No change in medical history since last visit.  Patient's pain is at baseline.   Patient continues multimodal pain regimen as prescribed.  States that it provides pain relief and improvement in functional status.    Pharmacotherapy Assessment  Analgesic: Hydrocodone 10 mg 3 times daily as needed  Monitoring: [place] PMP: PDMP reviewed during this encounter.       Pharmacotherapy: No side-effects or adverse reactions reported. Compliance: No problems identified. Effectiveness: Clinically acceptable.  Brigitte Pulse, RN  05/31/2023 11:16 AM  Sign when Signing Visit Nursing Pain Medication Assessment:  Safety precautions to be maintained throughout the outpatient stay will include: orient to surroundings, keep bed in low position, maintain call bell within reach at all times, provide assistance with transfer out of bed and ambulation.  Medication Inspection Compliance: Pill count conducted under aseptic conditions, in front of the patient. Neither the pills nor the bottle was removed from the patient's sight at any time. Once count was completed pills were immediately returned to the patient in their original bottle.  Medication: Hydrocodone/APAP Pill/Patch Count:  22 of 90 pills remain Pill/Patch Appearance: Markings consistent with prescribed medication Bottle Appearance: Standard pharmacy container. Clearly labeled. Filled Date: 28 / 29 / 2024 Last Medication intake:  TodaySafety precautions to be maintained throughout the outpatient stay will include: orient to surroundings, keep bed in low position, maintain call bell within reach at all times, provide assistance with transfer out of bed and ambulation.     No results found for: "CBDTHCR" No results found for: "D8THCCBX" No results found for: "D9THCCBX"  UDS:  Summary  Date Value Ref Range Status  03/08/2023 Note  Final    Comment:    ==================================================================== [facility] (MW) ==================================================================== Test  Result       Flag       Units  Drug Present and Declared for Prescription Verification   Hydrocodone                    3516         EXPECTED   ng/mg creat   Hydromorphone                  916          EXPECTED   ng/mg creat   Dihydrocodeine                 259          EXPECTED   ng/mg creat   Norhydrocodone                 1391         EXPECTED   ng/mg creat    Sources of hydrocodone include scheduled prescription medications.    Hydromorphone, dihydrocodeine and norhydrocodone are expected    metabolites of hydrocodone. Hydromorphone and dihydrocodeine are    also available as scheduled prescription medications.  ==================================================================== Test                      Result    Flag   Units      Ref Range   Creatinine              32               mg/dL      >=29 ==================================================================== Declared Medications:  The flagging and interpretation on this report are based on the  following declared medications.  Unexpected results may arise from  inaccuracies in the declared medications.   **Note: The testing scope of this panel includes these medications:   Hydrocodone (Norco)   **Note: The testing scope of this panel does not include the  following reported medications:   Acetaminophen (Norco)  Albuterol  Anastrozole (Arimidex)  Buspirone (Buspar)  Cholecalciferol  Cyclobenzaprine (Flexeril)  Gabapentin (Neurontin)  Helium  Metoprolol  Multivitamin  Nifedipine  Omega-3 Fatty Acids  Omeprazole  Oxygen  Potassium  Rosuvastatin  Telmisartan  Torsemide  Umeclidinium (Anoro)  Venlafaxine (Effexor)  Vilanterol (Anoro) ==================================================================== For clinical consultation, please call [phone]. ====================================================================       ROS  Constitutional: Denies any fever or  chills Gastrointestinal: No reported hemesis, hematochezia, vomiting, or acute GI distress Musculoskeletal:  Cervicalgia Neurological: No reported episodes of acute onset apraxia, aphasia, dysarthria, agnosia, amnesia, paralysis, loss of coordination, or loss of consciousness  Medication Review  HYDROcodone-acetaminophen, Multiple Vitamins-Minerals, NIFEdipine, Oxygen-Helium, Potassium Gluconate, Vitamin D3, albuterol, anastrozole, atenolol, cyclobenzaprine, gabapentin, metoprolol succinate, omega-3 acid ethyl esters, omeprazole, rosuvastatin, telmisartan, torsemide, umeclidinium-vilanterol, and venlafaxine XR  History Review  Allergy: Ms. Valiant is allergic to aczone [dapsone], abilify [aripiprazole], atarax [hydroxyzine], hydrochlorothiazide, sulfa antibiotics, and sulfasalazine. Drug: Ms. Ketterling  reports no history of drug use. Alcohol:  reports no history of alcohol use. Tobacco:  reports that she quit smoking about 17 years ago. Her smoking use included cigarettes. She started smoking about 59 years ago. She has a 42.4 pack-year smoking history. She has never used smokeless tobacco. Social: Ms. Rozelle  reports that she quit smoking about 17 years ago. Her smoking use included cigarettes. She started smoking about 59 years ago. She has a 42.4 pack-year smoking history. She has never used smokeless tobacco. She reports that she does  not drink alcohol and does not use drugs. Medical:  has a past medical history of Anal squamous cell carcinoma (HCC) (03/10/2016), Anemia, Anxiety, Aortic atherosclerosis (HCC), Arthritis, Breast cancer, left (HCC) (07/10/2022), Complication of anesthesia, Constipation, COPD (chronic obstructive pulmonary disease) (HCC), Coronary artery calcification seen on CT scan, Depression, Diastolic dysfunction (06/08/2021), Emphysema of lung (HCC), GERD (gastroesophageal reflux disease), Goals of care, counseling/discussion (07/10/2022), H/O allergic rhinitis, Heart murmur,  Hemorrhoid, History of fusion of cervical spine, History of kidney stones, Hyperlipidemia, Hyperparathyroidism (HCC), Hypertension, Macular degeneration, 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 Surgical: Ms. Rindler  has a past surgical history that includes Tubal ligation; Anterior fusion lumbar spine (1990); Mass excision (Left, 02/23/2016); Colonoscopy (2018); Portacath placement (Left, 03/12/2016); Cardiac catheterization (2009? ); Cardiac catheterization (05/2012); Port-a-cath removal (2018); Liver biopsy (N/A, 09/12/2017); Cholecystectomy (N/A, 09/12/2017); Trigger finger release (Right, 09/2018); Anterior cervical decomp/discectomy fusion (N/A, 2001); Tonsillectomy (Bilateral, 1959); Anterior and posterior repair (N/A, 12/28/2021); Vaginal hysterectomy (N/A, 12/28/2021); Breast biopsy (Left, 07/05/2022); Breast biopsy (Left, 07/05/2022); Breast biopsy (Left, 07/22/2022); and Part mastectomy,radio frequency localizer,axillary sentinel node biopsy (Left, 07/26/2022). Family: family history includes Asthma in her mother; Breast cancer in her cousin and maternal aunt; Cancer in her brother; Heart attack in her mother; Liver cancer (age of onset: 64) in her father.  Laboratory Chemistry Profile   Renal Lab Results  Component Value Date   BUN 15 05/16/2023   CREATININE 0.83 05/16/2023   LABCREA 127 05/16/2023   BCR SEE NOTE: 05/16/2023   GFRAA 75 04/23/2019   GFRNONAA >60 11/24/2022    Hepatic Lab Results  Component Value Date   AST 38 (H) 05/16/2023   ALT 32 (H) 05/16/2023   ALBUMIN 4.2 11/24/2022   ALKPHOS 107  11/24/2022   LIPASE 27 08/03/2017    Electrolytes Lab Results  Component Value Date   NA 142 05/16/2023   K 4.1 05/16/2023   CL 100 05/16/2023   CALCIUM 10.3 05/16/2023    Bone Lab Results  Component Value Date   VD25OH 57 07/22/2021    Inflammation (CRP: Acute Phase) (ESR: Chronic Phase) Lab Results  Component Value Date   CRP 25.5 (H) 05/20/2023         Note: Above Lab results reviewed.  Recent Imaging Review  [place] LIMITED ULTRASOUND INCLUDING AXILLA LEFT BREAST  CLINICAL DATA:  Status post LEFT lumpectomy in 2023, December. History of invasive mammary carcinoma. No radiation was performed due to lung disease. On Arimidex. Flu vaccine in mid September.  EXAM: DIGITAL DIAGNOSTIC BILATERAL MAMMOGRAM WITH TOMOSYNTHESIS AND CAD; ULTRASOUND LEFT BREAST LIMITED  TECHNIQUE: Bilateral digital diagnostic mammography and breast tomosynthesis was performed. The images were evaluated with computer-aided detection. ; Targeted ultrasound examination of the left breast was performed.  COMPARISON:  Previous exam(s).  ACR Breast Density Category b: There are scattered areas of fibroglandular density.  FINDINGS: There is density and architectural distortion within the LEFT breast, consistent with postsurgical changes. These are new in comparison to prior. There is an oval mass in the LEFT upper outer breast at posterior depth which is mildly increased in size in comparison to priors.  No suspicious mass, distortion, or microcalcifications are identified to suggest presence of malignancy in the RIGHT breast.  Targeted ultrasound was performed of  the LEFT upper outer breast. At 2 o'clock 10 cm from the nipple, there is an oval circumscribed hypoechoic mass. It measures 4 x 2 by 4 mm. This is favored to correspond to the oval mass noted mammographically.  IMPRESSION: 1. There is mildly increased size of a 4 mm favored intramammary lymph node in the LEFT breast. Recommend  ultrasound-guided biopsy for definitive characterization. 2. No mammographic evidence of malignancy in the RIGHT breast.  RECOMMENDATION: LEFT breast ultrasound-guided biopsy x1  With benign biopsy results, recommend bilateral diagnostic mammogram (with RIGHT and LEFT breast ultrasound if deemed necessary) in 1 year as per lumpectomy protocol.  I have discussed the findings and recommendations with the patient. The biopsy procedure was discussed with the patient and questions were answered. Patient expressed their understanding of the biopsy recommendation. Patient will be scheduled for biopsy at her earliest convenience by the schedulers. Ordering provider will be notified. If applicable, a reminder letter will be sent to the patient regarding the next appointment.  BI-RADS CATEGORY  4: Suspicious.  Electronically Signed   By: Meda Klinefelter M.D.   On: 05/30/2023 10:46 MM 3D DIAGNOSTIC MAMMOGRAM BILATERAL BREAST CLINICAL DATA:  Status post LEFT lumpectomy in 2023, December. History of invasive mammary carcinoma. No radiation was performed due to lung disease. On Arimidex. Flu vaccine in mid September.  EXAM: DIGITAL DIAGNOSTIC BILATERAL MAMMOGRAM WITH TOMOSYNTHESIS AND CAD; ULTRASOUND LEFT BREAST LIMITED  TECHNIQUE: Bilateral digital diagnostic mammography and breast tomosynthesis was performed. The images were evaluated with computer-aided detection. ; Targeted ultrasound examination of the left breast was performed.  COMPARISON:  Previous exam(s).  ACR Breast Density Category b: There are scattered areas of fibroglandular density.  FINDINGS: There is density and architectural distortion within the LEFT breast, consistent with postsurgical changes. These are new in comparison to prior. There is an oval mass in the LEFT upper outer breast at posterior depth which is mildly increased in size in comparison to priors.  No suspicious mass, distortion, or  microcalcifications are identified to suggest presence of malignancy in the RIGHT breast.  Targeted ultrasound was performed of the LEFT upper outer breast. At 2 o'clock 10 cm from the nipple, there is an oval circumscribed hypoechoic mass. It measures 4 x 2 by 4 mm. This is favored to correspond to the oval mass noted mammographically.  IMPRESSION: 1. There is mildly increased size of a 4 mm favored intramammary lymph node in the LEFT breast. Recommend ultrasound-guided biopsy for definitive characterization. 2. No mammographic evidence of malignancy in the RIGHT breast.  RECOMMENDATION: LEFT breast ultrasound-guided biopsy x1  With benign biopsy results, recommend bilateral diagnostic mammogram (with RIGHT and LEFT breast ultrasound if deemed necessary) in 1 year as per lumpectomy protocol.  I have discussed the findings and recommendations with the patient. The biopsy procedure was discussed with the patient and questions were answered. Patient expressed their understanding of the biopsy recommendation. Patient will be scheduled for biopsy at her earliest convenience by the schedulers. Ordering provider will be notified. If applicable, a reminder letter will be sent to the patient regarding the next appointment.  BI-RADS CATEGORY  4: Suspicious.  Electronically Signed   By: Meda Klinefelter M.D.   On: 05/30/2023 10:46 Note: Reviewed        Physical Exam  General appearance: Well nourished, well developed, and well hydrated. In no apparent acute distress Mental status: Alert, oriented x 3 (person, place, & time)       Respiratory: No evidence of acute  respiratory distress on home oxygen 2 L Eyes: PERLA Vitals: BP 122/76   Pulse 76   Temp (!) 97.3 F (36.3 C)   Ht 5\' 4"  (1.626 m)   Wt 184 lb (83.5 kg)   LMP 03/04/2002 (Approximate)   SpO2 (!) 85% Comment: 2 liters  BMI 31.58 kg/m  BMI: Estimated body mass index is 31.58 kg/m as calculated from the following:    Height as of this encounter: 5\' 4"  (1.626 m).   Weight as of this encounter: 184 lb (83.5 kg). Ideal: Ideal body weight: 54.7 kg (120 lb 9.5 oz) Adjusted ideal body weight: 66.2 kg (145 lb 15.3 oz)  Cervicalgia with pain radiation into bilateral upper extremity  5 out of 5 strength bilateral lower extremity: Plantar flexion, dorsiflexion, knee flexion, knee extension.   Assessment   Diagnosis Status  1. Chronic pain syndrome   2. Cervical radiculopathy (left)   3. Lumbar radiculopathy   4. Chronic cervical pain   5. Chronic radicular lumbar pain    Persistent Persistent Persistent     Plan of Care  Problem-specific:  No problem-specific Assessment & Plan notes found for this encounter.  Ms. DESJA BORO has a current medication list which includes the following long-term medication(s): atenolol, omega-3 acid ethyl esters, omeprazole, potassium gluconate, rosuvastatin, venlafaxine xr, and gabapentin.  Pharmacotherapy (Medications Ordered): Meds ordered this encounter  Medications   HYDROcodone-acetaminophen (NORCO) 10-325 MG tablet    Sig: Take 1 tablet by mouth every 8 (eight) hours as needed for severe pain (pain score 7-10). Must last 30 days.    Dispense:  90 tablet    Refill:  0    Chronic Pain: STOP Act (Not applicable) Fill 1 day early if closed on refill date. Avoid benzodiazepines within 8 hours of opioids   HYDROcodone-acetaminophen (NORCO) 10-325 MG tablet    Sig: Take 1 tablet by mouth every 8 (eight) hours as needed for severe pain (pain score 7-10). Must last 30 days.    Dispense:  90 tablet    Refill:  0    Chronic Pain: STOP Act (Not applicable) Fill 1 day early if closed on refill date. Avoid benzodiazepines within 8 hours of opioids   HYDROcodone-acetaminophen (NORCO) 10-325 MG tablet    Sig: Take 1 tablet by mouth every 8 (eight) hours as needed for severe pain (pain score 7-10). Must last 30 days.    Dispense:  90 tablet    Refill:  0    Chronic  Pain: STOP Act (Not applicable) Fill 1 day early if closed on refill date. Avoid benzodiazepines within 8 hours of opioids   gabapentin (NEURONTIN) 400 MG capsule    Sig: Take 1 capsule (400 mg total) by mouth 3 (three) times daily.    Dispense:  90 capsule    Refill:  5   cyclobenzaprine (FLEXERIL) 5 MG tablet    Sig: Take 1 tablet (5 mg total) by mouth 3 (three) times daily as needed for muscle spasms.    Dispense:  90 tablet    Refill:  5    Do not place this medication, or any other prescription from our practice, on "Automatic Refill". Patient may have prescription filled one day early if pharmacy is closed on scheduled refill date.    No orders of the defined types were placed in this encounter.    Follow-up plan:   Return in about 3 months (around 09/01/2023) for MM, F2F.    Recent Visits Date Type Provider Dept  03/08/23 Office Visit Edward Jolly, MD [facility]  Showing recent visits within past 90 days and meeting all other requirements Today's Visits Date Type Provider Dept  05/31/23 Office Visit Edward Jolly, MD [facility]  Showing today's visits and meeting all other requirements Future Appointments Date Type Provider Dept  08/25/23 Appointment Edward Jolly, MD [facility]  Showing future appointments within next 90 days and meeting all other requirements  I discussed the assessment and treatment plan with the patient. The patient was provided an opportunity to ask questions and all were answered. The patient agreed with the plan and demonstrated an understanding of the instructions.  Patient advised to call back or seek an in-person evaluation if the symptoms or condition worsens.  Duration of encounter: .  Total time on encounter, as per AMA guidelines included both the face-to-face and non-face-to-face time personally spent by the physician and/or other qualified health care professional(s) on the day of the encounter  (includes time in activities that require the physician or other qualified health care professional and does not include time in activities normally performed by clinical staff). Physician's time may include the following activities when performed: preparing to see the patient (eg, review of tests, pre-charting review of records) obtaining and/or reviewing separately obtained history performing a medically appropriate examination and/or evaluation counseling and educating the patient/family/caregiver ordering medications, tests, or procedures referring and communicating with other health care professionals (when not separately reported) documenting clinical information in the electronic or other health record independently interpreting results (not separately reported) and communicating results to the patient/ family/caregiver care coordination (not separately reported)  Note by: Edward Jolly, MD Date: 05/31/2023; Time: 11:54 AM

## 2023-05-31 NOTE — Progress Notes (Signed)
Nursing Pain Medication Assessment:  Safety precautions to be maintained throughout the outpatient stay will include: orient to surroundings, keep bed in low position, maintain call bell within reach at all times, provide assistance with transfer out of bed and ambulation.  Medication Inspection Compliance: Pill count conducted under aseptic conditions, in front of the patient. Neither the pills nor the bottle was removed from the patient's sight at any time. Once count was completed pills were immediately returned to the patient in their original bottle.  Medication: Hydrocodone/APAP Pill/Patch Count:  22 of 90 pills remain Pill/Patch Appearance: Markings consistent with prescribed medication Bottle Appearance: Standard pharmacy container. Clearly labeled. Filled Date: 48 / 29 / 2024 Last Medication intake:  TodaySafety precautions to be maintained throughout the outpatient stay will include: orient to surroundings, keep bed in low position, maintain call bell within reach at all times, provide assistance with transfer out of bed and ambulation.

## 2023-06-01 ENCOUNTER — Inpatient Hospital Stay: Payer: Medicare HMO | Attending: Oncology

## 2023-06-01 ENCOUNTER — Inpatient Hospital Stay (HOSPITAL_BASED_OUTPATIENT_CLINIC_OR_DEPARTMENT_OTHER): Payer: Medicare HMO | Admitting: Oncology

## 2023-06-01 ENCOUNTER — Encounter: Payer: Self-pay | Admitting: Oncology

## 2023-06-01 VITALS — BP 128/70 | HR 74 | Temp 97.1°F | Resp 18 | Wt 186.8 lb

## 2023-06-01 DIAGNOSIS — J9610 Chronic respiratory failure, unspecified whether with hypoxia or hypercapnia: Secondary | ICD-10-CM | POA: Insufficient documentation

## 2023-06-01 DIAGNOSIS — Z170 Estrogen receptor positive status [ER+]: Secondary | ICD-10-CM | POA: Diagnosis not present

## 2023-06-01 DIAGNOSIS — M8580 Other specified disorders of bone density and structure, unspecified site: Secondary | ICD-10-CM | POA: Diagnosis not present

## 2023-06-01 DIAGNOSIS — C50919 Malignant neoplasm of unspecified site of unspecified female breast: Secondary | ICD-10-CM | POA: Diagnosis not present

## 2023-06-01 DIAGNOSIS — I272 Pulmonary hypertension, unspecified: Secondary | ICD-10-CM | POA: Diagnosis not present

## 2023-06-01 DIAGNOSIS — Z85048 Personal history of other malignant neoplasm of rectum, rectosigmoid junction, and anus: Secondary | ICD-10-CM | POA: Diagnosis not present

## 2023-06-01 DIAGNOSIS — C50412 Malignant neoplasm of upper-outer quadrant of left female breast: Secondary | ICD-10-CM | POA: Diagnosis not present

## 2023-06-01 LAB — CBC WITH DIFFERENTIAL (CANCER CENTER ONLY)
Abs Immature Granulocytes: 0.18 10*3/uL — ABNORMAL HIGH (ref 0.00–0.07)
Basophils Absolute: 0.1 10*3/uL (ref 0.0–0.1)
Basophils Relative: 1 %
Eosinophils Absolute: 0.5 10*3/uL (ref 0.0–0.5)
Eosinophils Relative: 4 %
HCT: 36 % (ref 36.0–46.0)
Hemoglobin: 11.4 g/dL — ABNORMAL LOW (ref 12.0–15.0)
Immature Granulocytes: 2 %
Lymphocytes Relative: 16 %
Lymphs Abs: 1.7 10*3/uL (ref 0.7–4.0)
MCH: 28.8 pg (ref 26.0–34.0)
MCHC: 31.7 g/dL (ref 30.0–36.0)
MCV: 90.9 fL (ref 80.0–100.0)
Monocytes Absolute: 0.7 10*3/uL (ref 0.1–1.0)
Monocytes Relative: 7 %
Neutro Abs: 8 10*3/uL — ABNORMAL HIGH (ref 1.7–7.7)
Neutrophils Relative %: 70 %
Platelet Count: 310 10*3/uL (ref 150–400)
RBC: 3.96 MIL/uL (ref 3.87–5.11)
RDW: 14.5 % (ref 11.5–15.5)
WBC Count: 11.2 10*3/uL — ABNORMAL HIGH (ref 4.0–10.5)
nRBC: 0 % (ref 0.0–0.2)

## 2023-06-01 LAB — CMP (CANCER CENTER ONLY)
ALT: 37 U/L (ref 0–44)
AST: 52 U/L — ABNORMAL HIGH (ref 15–41)
Albumin: 4.6 g/dL (ref 3.5–5.0)
Alkaline Phosphatase: 128 U/L — ABNORMAL HIGH (ref 38–126)
Anion gap: 9 (ref 5–15)
BUN: 14 mg/dL (ref 8–23)
CO2: 28 mmol/L (ref 22–32)
Calcium: 9.7 mg/dL (ref 8.9–10.3)
Chloride: 101 mmol/L (ref 98–111)
Creatinine: 0.73 mg/dL (ref 0.44–1.00)
GFR, Estimated: >60 mL/min (ref >60)
Glucose, Bld: 125 mg/dL — ABNORMAL HIGH (ref 70–99)
Potassium: 3.6 mmol/L (ref 3.5–5.1)
Sodium: 138 mmol/L (ref 135–145)
Total Bilirubin: 0.5 mg/dL (ref 0.3–1.2)
Total Protein: 8.5 g/dL — ABNORMAL HIGH (ref 6.5–8.1)

## 2023-06-01 MED ORDER — ANASTROZOLE 1 MG PO TABS: 1.0000 mg | ORAL_TABLET | Freq: Every day | ORAL | 1 refills | Status: DC

## 2023-06-01 NOTE — Progress Notes (Signed)
Hematology/Oncology Progress note [phone] [phone]     CHIEF COMPLAINTS/PURPOSE OF CONSULTATION:  Left breast invasive cancer   ASSESSMENT & PLAN:   Invasive carcinoma of breast (HCC) Left breast invasive mammary carcinoma pT1b pN0, s/p lumpectomy with sentinel lymph node biopsy Oncotype DX 8, Distant recurrence at 9 years 3%, chemotherapy benefit <1%, no need for adjuvant chemo [facility] -recommend to omit  adjuvant radiation due to her lung disease.  She tolerates Arimidex 1mg  daily. Refills were sent.   Annual mammogram bilaterally- Oct 2024 results were reviewed. Slightly increase left intramammary lymph node, she will have US guided biopsy of that lymph node for further evaluation.   Osteopenia 11/25/2022 DEXA osteopenia FRAX major osteoporotic fracture is 9.1% within the next ten years. Recommend calcium and Vitamin D supplementation.    Orders Placed This Encounter  Procedures   CBC with Differential (Cancer Center Only)    Standing Status:   Future    Standing Expiration Date:   05/31/2024   CMP (Cancer Center only)    Standing Status:   Future    Standing Expiration Date:   05/31/2024   Follow up in 6 months.  All questions were answered. The patient knows to call the clinic with any problems, questions or concerns.  Rickard Patience, MD, PhD [facility] Hematology Oncology 06/01/2023        HISTORY OF PRESENTING ILLNESS:  Judy Allen 67 y.o. female presents to establish care for left breast invasive cancer.  I have reviewed her chart and materials related to her cancer extensively and collaborated history with the patient. Summary of oncologic history is as follows: Oncology History  Anal squamous cell carcinoma (HCC)  03/09/2016 Initial Diagnosis   Anal squamous cell carcinoma   -Stage IIIb Squamous Cell Carcinoma of the Anus: Initially cT2 N2 M0 with left inguinal node involvement. Notably was HIV negative. Completed definitive  chemoradiation with concurrent 5FU/mitomycin 05/21/16, for curative intent.     Invasive carcinoma of breast (HCC)  05/27/2022 Mammogram   In the left breast, a possible mass warrants further evaluation. In the right breast, no findings suspicious for malignancy. Further evaluation is suggested for a possible mass in the left breast.   06/16/2022 Mammogram   Unilateral left diagnostic mammogram  There is an indeterminate 7 mm mass in the LEFT breast at 8 o'clock 5 cm from the nipple which is favored to correspond to the site of screening mammographic concern. Recommend ultrasound-guided biopsy for definitive characterization with attention on post marker placement mammogram to assess for mammographic/sonographic correlation.   07/05/2022 Initial Diagnosis   Invasive carcinoma of breast (HCC)  -left breast lesion core biopsy Showed invasive mammary carcinoma, no special type.  Grade 1, DCIS not identified.  LVI not identified, ER/PR 90% positive, HER2 equivocal IHC 2+, HER2 FISH negative.    07/09/2022 Cancer Staging   Staging form: Breast, AJCC 8th Edition - Clinical stage from 07/09/2022: Stage IA (cT1b, cN0, cM0, G1, ER+, PR+, HER2-) - Signed by Rickard Patience, MD on 07/15/2022 Stage prefix: Initial diagnosis Histologic grading system: 3 grade system   07/26/2022 Surgery   Patient underwent left breast lumpectomy and SLNB  invasive mammary carcinoma, no special type.  Grade 1, DCIS+ low grade, all 3 sentinel lymph nodes negative.  All margins negative. pT1b pN0   07/26/2022 Oncotype testing   Oncotype Dx 8, Distant recurrence at 9 years 3%, chemotherapy benefit <1%   07/26/2022 Cancer Staging   Staging form: Breast, AJCC 8th Edition -  Pathologic stage from 07/26/2022: Stage IA (pT1b, pN0, cM0, G1, ER+, PR+, HER2-, Oncotype DX score: 8) - Signed by Rickard Patience, MD on 08/13/2022 Stage prefix: Initial diagnosis Multigene prognostic tests performed: Oncotype DX Recurrence score range: Less  than 11 Histologic grading system: 3 grade system    Genetic Testing   Negative genetic testing. No pathogenic variants identified on the [facility] Common Hereditary Cancers+RNA panel. VUS in POLE called c.623C>G identified. The report date is 08/18/2022.  The Common Hereditary Cancers Panel + RNA offered by [facility] includes sequencing and/or deletion duplication testing of the following 48 genes: APC*, ATM*, AXIN2, BAP1, BARD1, BMPR1A, BRCA1, BRCA2, BRIP1, CDH1, CDK4, CDKN2A (p14ARF), CDKN2A (p16INK4a), CHEK2, CTNNA1, DICER1*, EPCAM*, FH*, GREM1*, HOXB13, KIT, MBD4, MEN1*, MLH1*, MSH2*, MSH3*, MSH6*, MUTYH, NF1*, NTHL1, PALB2, PDGFRA, PMS2*, POLD1*, POLE, PTEN*, RAD51C, RAD51D, SDHA*, SDHB, SDHC*, SDHD, SMAD4, SMARCA4, STK11, TP53, TSC1*, TSC2, VHL.     Menarche 67 years of age Patient has 3 children. Age at first childbirth 77 years old, Previous OCP use less than 5 years Postmenopausal, last menstrual period July 2003-age of 45, Patient has had a hysterectomy. She has a history of brief hormone replacement therapy, 2 weeks, discontinued due to intolerance. No previous breast biopsies.  Family history positive for maternal aunt with breast cancer.  Father with liver cancer. Patient has history of chronic respiratory failure due to pulmonary hypertension.  Patient is on nasal cannula oxygen.  She follows up with [facility] pulmonology    INTERVAL HISTORY Judy Allen is a 67 y.o. female who has above history reviewed by me today presents for follow up visit for Stage 1 left breast cancer.  She tolerates Arimidex 1mg  dialy. Manageable side effects.    MEDICAL HISTORY:  Past Medical History:  Diagnosis Date   Anal squamous cell carcinoma (HCC) 03/10/2016   a.) clinical stage IIIB (T2, N2, M0); Tx'd with chemotherapy + XRT   Anemia    Anxiety    Aortic atherosclerosis (HCC)    Arthritis    Breast cancer, left (HCC) 07/10/2022   Complication of anesthesia    a.) PONV   Constipation     COPD (chronic obstructive pulmonary disease) (HCC)    Coronary artery calcification seen on CT scan    Depression    Diastolic dysfunction 06/08/2021   a.) TTE 06/08/2021: EF 55-60%; mild MR; PASP 47 mmHg; G1DD.   Emphysema of lung (HCC)    GERD (gastroesophageal reflux disease)    Goals of care, counseling/discussion 07/10/2022   H/O allergic rhinitis    Heart murmur    Hemorrhoid    History of fusion of cervical spine    a.) s/p ACDF C4-C7   History of kidney stones    Hyperlipidemia    Hyperparathyroidism (HCC)    Hypertension    Macular degeneration    Methemoglobinemia 09/07/2016   Neck pain, chronic    Neuritis    OSA on CPAP 09/12/2016   Ovarian failure    Oxygen dependent    a.) 2 L/[place] (day) with increase to 4 L/[employer] (night)   Personal history of chemotherapy    Personal history of radiation therapy    Pneumonitis 2017   a.) related to MITOCYCIN administration for squamous cell anal cancer   PONV (postoperative nausea and vomiting)    Proteinuria    Psoriasis    Pulmonary HTN (HCC) 06/22/2005   a.) TTE 06/22/2005: EF 61%; RVSP 31. b.) TTE 12/18/2009: EF 57%; RVSP 19.9. c.) TTE 05/20/2016: EF >55%;  PASP 43. d.) TTE 04/07/2017: EF >55%; PASP 26. e.) TTE 03/12/2018: EF >55%; PASP 26. f.) TTE 06/08/2021: EF 55-60%; PASP 47.   Steatohepatitis    a.) grade II (moderate) on 09/2017 Bx   T2DM (type 2 diabetes mellitus) (HCC)    Tachycardia, paroxysmal (HCC)    Urinary incontinence     SURGICAL HISTORY: Past Surgical History:  Procedure Laterality Date   ANTERIOR AND POSTERIOR REPAIR N/A 12/28/2021   Procedure: ANTERIOR (CYSTOCELE) AND POSTERIOR REPAIR (RECTOCELE);  Surgeon: Hildred Laser, MD;  Location: [facility];  Service: Gynecology;  Laterality: N/A;   ANTERIOR CERVICAL DECOMP/DISCECTOMY FUSION N/A 2001   PROCEDURE: ACDF C4-C7   ANTERIOR FUSION LUMBAR SPINE  1990   plate in back, not metal   BREAST BIOPSY Left 07/05/2022   [place] Bx, Ribbon Clip, Path Pending   BREAST  BIOPSY Left 07/05/2022   [place] LT BREAST BX W LOC DEV 1ST LESION IMG BX SPEC US GUIDE 07/05/2022 [facility]   BREAST BIOPSY Left 07/22/2022   [place] LT RADIO FREQUENCY TAG LOC US GUIDE 07/22/2022 [facility]   CARDIAC CATHETERIZATION  2009?    [facility]   CARDIAC CATHETERIZATION  05/2012   RHC: Mild hypertension 37/12 with a mean pressure of 21 mm mercury   CHOLECYSTECTOMY N/A 09/12/2017   Procedure: LAPAROSCOPIC CHOLECYSTECTOMY WITH INTRAOPERATIVE CHOLANGIOGRAM;  Surgeon: Earline Mayotte, MD;  Location: [facility];  Service: General;  Laterality: N/A;   COLONOSCOPY  2018   LIVER BIOPSY N/A 09/12/2017   Procedure: LIVER BIOPSY;  Surgeon: Earline Mayotte, MD;  Location: [facility];  Service: General;  Laterality: N/A;   MASS EXCISION Left 02/23/2016   Procedure: EXCISION MASS;  Surgeon: Kieth Brightly, MD;  Location: [facility];  Service: General;  Laterality: Left;   PART MASTECTOMY,RADIO FREQUENCY LOCALIZER,AXILLARY SENTINEL NODE BIOPSY Left 07/26/2022   INVASIVE MAMMARY CARCINOMA, NO SPECIAL TYPE.  - DUCTAL CARCINOMA IN SITU, LOW GRADE margins 6mm closest anterior  Carolan Shiver   PORT-A-CATH REMOVAL  2018   PORTACATH PLACEMENT Left 03/12/2016   Procedure: INSERTION PORT-A-CATH;  Surgeon: Kieth Brightly, MD;  Location: [facility];  Service: General;  Laterality: Left;   TONSILLECTOMY Bilateral 1959   TRIGGER FINGER RELEASE Right 09/2018   Dr. Hyacinth Meeker    TUBAL LIGATION     s/p   VAGINAL HYSTERECTOMY N/A 12/28/2021   Procedure: HYSTERECTOMY VAGINAL;  Surgeon: Hildred Laser, MD;  Location: [facility];  Service: Gynecology;  Laterality: N/A;    SOCIAL HISTORY: Social History   Socioeconomic History   Marital status: Widowed    Spouse name: Not on file   Number of children: 3   Years of education: Not on file   Highest education level: 11th grade  Occupational History   Not on file  Tobacco Use   Smoking status: Former    Current packs/day: 0.00    Average  packs/day: 1 pack/day for 42.4 years (42.4 ttl pk-yrs)    Types: Cigarettes    Start date: 08/10/1963    Quit date: 01/15/2006    Years since quitting: 17.3   Smokeless tobacco: Never  Vaping Use   Vaping status: Never Used  Substance and Sexual Activity   Alcohol use: No   Drug use: No   Sexual activity: Not Currently    Birth control/protection: Surgical  Other Topics Concern   Not on file  Social History Narrative   Husband passed away on Jan 01, 2018   She was living with Marchelle Folks for few years,  but moved in with Candace her oldest daughter this past Summer.    Social Determinants of Health   Financial Resource Strain: Low Risk  (12/18/2022)   Overall Financial Resource Strain (CARDIA)    Difficulty of Paying Living Expenses: Not hard at all  Food Insecurity: No Food Insecurity (12/18/2022)   Hunger Vital Sign    Worried About Running Out of Food in the Last Year: Never true    Ran Out of Food in the Last Year: Never true  Recent Concern: Food Insecurity - Food Insecurity Present (12/13/2022)   Received from [facility], [facility], [facility]   Hunger Vital Sign    Worried About Running Out of Food in the Last Year: Sometimes true    Ran Out of Food in the Last Year: Sometimes true  Transportation Needs: No Transportation Needs (12/18/2022)   PRAPARE - Administrator, Civil Service (Medical): No    Lack of Transportation (Non-Medical): No  Physical Activity: Inactive (12/18/2022)   Exercise Vital Sign    Days of Exercise per Week: 0 days    Minutes of Exercise per Session: 0 min  Stress: Stress Concern Present (12/18/2022)   [employer] of Occupational Health - Occupational Stress Questionnaire    Feeling of Stress : Rather much  Social Connections: Socially Isolated (12/18/2022)   Social Connection and Isolation Panel [NHANES]    Frequency of Communication with Friends and Family: More than three times a week    Frequency of Social Gatherings with  Friends and Family: Once a week    Attends Religious Services: Never    Database administrator or Organizations: No    Attends Banker Meetings: Never    Marital Status: Widowed  Intimate Partner Violence: Not At Risk (08/30/2022)   Humiliation, Afraid, Rape, and Kick questionnaire    Fear of Current or Ex-Partner: No    Emotionally Abused: No    Physically Abused: No    Sexually Abused: No    FAMILY HISTORY: Family History  Problem Relation Age of Onset   Heart attack Mother    Asthma Mother    Liver cancer Father 90   Cancer Brother        unk type; metastatic   Breast cancer Maternal Aunt        dx 20s-30s   Breast cancer Cousin        dx 63s   Bladder Cancer Neg Hx    Kidney cancer Neg Hx    Colon cancer Neg Hx     ALLERGIES:  is allergic to aczone [dapsone], abilify [aripiprazole], atarax [hydroxyzine], hydrochlorothiazide, sulfa antibiotics, and sulfasalazine.  MEDICATIONS:  Current Outpatient Medications  Medication Sig Dispense Refill   albuterol (PROVENTIL HFA;VENTOLIN HFA) 108 (90 Base) MCG/ACT inhaler Inhale 2 puffs into the lungs every 6 (six) hours as needed for wheezing or shortness of breath.     atenolol (TENORMIN) 25 MG tablet Take 0.5 tablets (12.5 mg total) by mouth 2 (two) times daily. 90 tablet 0   Cholecalciferol (VITAMIN D3) 1000 units CAPS Take 1 capsule by mouth daily.     cyclobenzaprine (FLEXERIL) 5 MG tablet Take 1 tablet (5 mg total) by mouth 3 (three) times daily as needed for muscle spasms. 90 tablet 5   gabapentin (NEURONTIN) 400 MG capsule Take 1 capsule (400 mg total) by mouth 3 (three) times daily. 90 capsule 5   [START ON 06/07/2023] HYDROcodone-acetaminophen (NORCO) 10-325 MG tablet Take 1  tablet by mouth every 8 (eight) hours as needed for severe pain (pain score 7-10). Must last 30 days. 90 tablet 0   metoprolol succinate (TOPROL-XL) 50 MG 24 hr tablet Take 50 mg by mouth in the morning.     Multiple Vitamins-Minerals (EYE  VITAMINS & MINERALS PO) Take 1 capsule by mouth in the morning. [employer] Eye Vitamins     NIFEdipine (PROCARDIA-XL/NIFEDICAL-XL) 30 MG 24 hr tablet Take 30 mg by mouth at bedtime.     omega-3 acid ethyl esters (LOVAZA) 1 g capsule TAKE 2 CAPSULES BY MOUTH TWO TIMES A DAY 360 capsule 1   omeprazole (PRILOSEC) 40 MG capsule TAKE 1 CAPSULE DAILY 90 capsule 3   OXYGEN Inhale 2-4 L into the lungs continuous. 2 L qd and 4 L qhs     Potassium Gluconate 80 MG TABS Take 1 tablet by mouth daily.     rosuvastatin (CRESTOR) 10 MG tablet Take 1 tablet (10 mg total) by mouth daily. 90 tablet 1   telmisartan (MICARDIS) 80 MG tablet Take 80 mg by mouth daily.     torsemide (DEMADEX) 5 MG tablet Take 5 mg by mouth every morning.     umeclidinium-vilanterol (ANORO ELLIPTA) 62.5-25 MCG/ACT AEPB Inhale 1 puff into the lungs in the morning.     venlafaxine XR (EFFEXOR-XR) 75 MG 24 hr capsule Take 1 capsule (75 mg total) by mouth 3 (three) times daily. 270 capsule 1   anastrozole (ARIMIDEX) 1 MG tablet Take 1 tablet (1 mg total) by mouth daily. 90 tablet 1   [START ON 07/07/2023] HYDROcodone-acetaminophen (NORCO) 10-325 MG tablet Take 1 tablet by mouth every 8 (eight) hours as needed for severe pain (pain score 7-10). Must last 30 days. (Patient not taking: Reported on 06/01/2023) 90 tablet 0   [START ON 08/06/2023] HYDROcodone-acetaminophen (NORCO) 10-325 MG tablet Take 1 tablet by mouth every 8 (eight) hours as needed for severe pain (pain score 7-10). Must last 30 days. (Patient not taking: Reported on 06/01/2023) 90 tablet 0   No current facility-administered medications for this visit.    Review of Systems  Constitutional:  Negative for appetite change, chills, fatigue and fever.  HENT:   Negative for hearing loss and voice change.   Eyes:  Negative for eye problems.  Respiratory:  Positive for shortness of breath. Negative for chest tightness and cough.   Cardiovascular:  Negative for chest pain.   Gastrointestinal:  Negative for abdominal distention, abdominal pain and blood in stool.  Endocrine: Negative for hot flashes.  Genitourinary:  Negative for difficulty urinating and frequency.   Musculoskeletal:  Negative for arthralgias.  Skin:  Negative for itching and rash.  Neurological:  Negative for extremity weakness.  Hematological:  Negative for adenopathy.  Psychiatric/Behavioral:  Negative for confusion.    Marland Kitchen    PHYSICAL EXAMINATION: ECOG PERFORMANCE STATUS: 1 - Symptomatic but completely ambulatory  Vitals:   06/01/23 0950  BP: 128/70  Pulse: 74  Resp: 18  Temp: (!) 97.1 F (36.2 C)  SpO2: (!) 89%   Filed Weights   06/01/23 0950  Weight: 186 lb 12.8 oz (84.7 kg)    Physical Exam Constitutional:      General: She is not in acute distress.    Appearance: She is not diaphoretic.  HENT:     Head: Normocephalic and atraumatic.  Eyes:     General: No scleral icterus. Cardiovascular:     Rate and Rhythm: Normal rate and regular rhythm.     Heart  sounds: No murmur heard. Pulmonary:     Effort: Pulmonary effort is normal. No respiratory distress.     Comments: On nasal cannula oxygen.  Abdominal:     General: There is no distension.     Palpations: Abdomen is soft.     Tenderness: There is no abdominal tenderness.  Musculoskeletal:        General: Normal range of motion.     Cervical back: Normal range of motion and neck supple.  Skin:    General: Skin is warm and dry.     Findings: No erythema.  Neurological:     Mental Status: She is alert and oriented to person, place, and time.     Cranial Nerves: No cranial nerve deficit.     Motor: No abnormal muscle tone.     Coordination: Coordination normal.  Psychiatric:        Mood and Affect: Affect normal.    Breast exam was performed in seated and lying down position. Patient is status post left breast biopsy, focal bruising at biopsy site. .  No palpable breast masses bilaterally.  No palpable axillary  adenopathy bilaterally  LABORATORY DATA:  I have reviewed the data as listed    Latest Ref Rng & Units 06/01/2023    9:33 AM 05/20/2023    9:35 AM 05/16/2023    3:41 PM  CBC  WBC 4.0 - 10.5 K/uL 11.2  9.6  11.3   Hemoglobin 12.0 - 15.0 g/dL 10.2  72.5  36.6   Hematocrit 36.0 - 46.0 % 36.0  36.5  35.9   Platelets 150 - 400 K/uL 310  337  357       Latest Ref Rng & Units 06/01/2023    9:33 AM 05/16/2023    3:41 PM 11/24/2022    9:42 AM  CMP  Glucose 70 - 99 mg/dL 440  97  347   BUN 8 - 23 mg/dL 14  15  16    Creatinine 0.44 - 1.00 mg/dL 4.25  9.56  3.87   Sodium 135 - 145 mmol/L 138  142  138   Potassium 3.5 - 5.1 mmol/L 3.6  4.1  3.9   Chloride 98 - 111 mmol/L 101  100  102   CO2 22 - 32 mmol/L 28  30  27    Calcium 8.9 - 10.3 mg/dL 9.7  56.4  9.5   Total Protein 6.5 - 8.1 g/dL 8.5  8.2  7.9   Total Bilirubin 0.3 - 1.2 mg/dL 0.5  0.4  0.4   Alkaline Phos 38 - 126 U/L 128   107   AST 15 - 41 U/L 52  38  47   ALT 0 - 44 U/L 37  32  44      RADIOGRAPHIC STUDIES: I have personally reviewed the radiological images as listed and agreed with the findings in the report. MM 3D DIAGNOSTIC MAMMOGRAM BILATERAL BREAST  Result Date: 05/30/2023 CLINICAL DATA:  Status post LEFT lumpectomy in 2023, December. History of invasive mammary carcinoma. No radiation was performed due to lung disease. On Arimidex. Flu vaccine in mid September. EXAM: DIGITAL DIAGNOSTIC BILATERAL MAMMOGRAM WITH TOMOSYNTHESIS AND CAD; ULTRASOUND LEFT BREAST LIMITED TECHNIQUE: Bilateral digital diagnostic mammography and breast tomosynthesis was performed. The images were evaluated with computer-aided detection. ; Targeted ultrasound examination of the left breast was performed. COMPARISON:  Previous exam(s). ACR Breast Density Category b: There are scattered areas of fibroglandular density. FINDINGS: There is density and architectural distortion within the  LEFT breast, consistent with postsurgical changes. These are new in  comparison to prior. There is an oval mass in the LEFT upper outer breast at posterior depth which is mildly increased in size in comparison to priors. No suspicious mass, distortion, or microcalcifications are identified to suggest presence of malignancy in the RIGHT breast. Targeted ultrasound was performed of the LEFT upper outer breast. At 2 o'clock 10 cm from the nipple, there is an oval circumscribed hypoechoic mass. It measures 4 x 2 by 4 mm. This is favored to correspond to the oval mass noted mammographically. IMPRESSION: 1. There is mildly increased size of a 4 mm favored intramammary lymph node in the LEFT breast. Recommend ultrasound-guided biopsy for definitive characterization. 2. No mammographic evidence of malignancy in the RIGHT breast. RECOMMENDATION: LEFT breast ultrasound-guided biopsy x1 With benign biopsy results, recommend bilateral diagnostic mammogram (with RIGHT and LEFT breast ultrasound if deemed necessary) in 1 year as per lumpectomy protocol. I have discussed the findings and recommendations with the patient. The biopsy procedure was discussed with the patient and questions were answered. Patient expressed their understanding of the biopsy recommendation. Patient will be scheduled for biopsy at her earliest convenience by the schedulers. Ordering provider will be notified. If applicable, a reminder letter will be sent to the patient regarding the next appointment. BI-RADS CATEGORY  4: Suspicious. Electronically Signed   By: Meda Klinefelter M.D.   On: 05/30/2023 10:46   [place] LIMITED ULTRASOUND INCLUDING AXILLA LEFT BREAST   Result Date: 05/30/2023 CLINICAL DATA:  Status post LEFT lumpectomy in 2023, December. History of invasive mammary carcinoma. No radiation was performed due to lung disease. On Arimidex. Flu vaccine in mid September. EXAM: DIGITAL DIAGNOSTIC BILATERAL MAMMOGRAM WITH TOMOSYNTHESIS AND CAD; ULTRASOUND LEFT BREAST LIMITED TECHNIQUE: Bilateral digital diagnostic  mammography and breast tomosynthesis was performed. The images were evaluated with computer-aided detection. ; Targeted ultrasound examination of the left breast was performed. COMPARISON:  Previous exam(s). ACR Breast Density Category b: There are scattered areas of fibroglandular density. FINDINGS: There is density and architectural distortion within the LEFT breast, consistent with postsurgical changes. These are new in comparison to prior. There is an oval mass in the LEFT upper outer breast at posterior depth which is mildly increased in size in comparison to priors. No suspicious mass, distortion, or microcalcifications are identified to suggest presence of malignancy in the RIGHT breast. Targeted ultrasound was performed of the LEFT upper outer breast. At 2 o'clock 10 cm from the nipple, there is an oval circumscribed hypoechoic mass. It measures 4 x 2 by 4 mm. This is favored to correspond to the oval mass noted mammographically. IMPRESSION: 1. There is mildly increased size of a 4 mm favored intramammary lymph node in the LEFT breast. Recommend ultrasound-guided biopsy for definitive characterization. 2. No mammographic evidence of malignancy in the RIGHT breast. RECOMMENDATION: LEFT breast ultrasound-guided biopsy x1 With benign biopsy results, recommend bilateral diagnostic mammogram (with RIGHT and LEFT breast ultrasound if deemed necessary) in 1 year as per lumpectomy protocol. I have discussed the findings and recommendations with the patient. The biopsy procedure was discussed with the patient and questions were answered. Patient expressed their understanding of the biopsy recommendation. Patient will be scheduled for biopsy at her earliest convenience by the schedulers. Ordering provider will be notified. If applicable, a reminder letter will be sent to the patient regarding the next appointment. BI-RADS CATEGORY  4: Suspicious. Electronically Signed   By: Meda Klinefelter M.D.   On: 05/30/2023  10:46      

## 2023-06-01 NOTE — Progress Notes (Signed)
Pt here for follow up. Pt will be having a breast biopsy on 10/30 due to enlarged lymph node

## 2023-06-01 NOTE — Assessment & Plan Note (Signed)
11/25/2022 DEXA osteopenia FRAX major osteoporotic fracture is 9.1% within the next ten years. Recommend calcium and Vitamin D supplementation.

## 2023-06-01 NOTE — Assessment & Plan Note (Addendum)
Left breast invasive mammary carcinoma pT1b pN0, s/p lumpectomy with sentinel lymph node biopsy Oncotype DX 8, Distant recurrence at 9 years 3%, chemotherapy benefit <1%, no need for adjuvant chemo [facility] -recommend to omit  adjuvant radiation due to her lung disease.  She tolerates Arimidex 1mg  daily. Refills were sent.   Annual mammogram bilaterally- Oct 2024 results were reviewed. Slightly increase left intramammary lymph node, she will have US guided biopsy of that lymph node for further evaluation.

## 2023-06-08 ENCOUNTER — Ambulatory Visit
Admission: RE | Admit: 2023-06-08 | Discharge: 2023-06-08 | Disposition: A | Discharge status: Home / Self Care | Payer: Medicare HMO | Source: Ambulatory Visit | Attending: Oncology | Admitting: Oncology

## 2023-06-08 DIAGNOSIS — R928 Other abnormal and inconclusive findings on diagnostic imaging of breast: Secondary | ICD-10-CM

## 2023-06-08 DIAGNOSIS — R599 Enlarged lymph nodes, unspecified: Secondary | ICD-10-CM | POA: Insufficient documentation

## 2023-06-08 DIAGNOSIS — N6321 Unspecified lump in the left breast, upper outer quadrant: Secondary | ICD-10-CM | POA: Diagnosis not present

## 2023-06-08 HISTORY — PX: BREAST BIOPSY: SHX20

## 2023-06-08 MED ORDER — LIDOCAINE 1 % OPTIME INJ - NO CHARGE
5.0000 mL | Freq: Once | INTRAMUSCULAR | Status: AC
  Administered 2023-06-08: 5 mL via INTRADERMAL
  Filled 2023-06-08: qty 6

## 2023-06-08 MED ORDER — LIDOCAINE-EPINEPHRINE 1 %-1:100000 IJ SOLN
10.0000 mL | Freq: Once | INTRAMUSCULAR | Status: AC
  Administered 2023-06-08: 10 mL via INTRADERMAL
  Filled 2023-06-08: qty 10

## 2023-06-09 ENCOUNTER — Telehealth: Payer: Self-pay | Admitting: *Deleted

## 2023-06-09 LAB — SURGICAL PATHOLOGY

## 2023-06-09 NOTE — Patient Outreach (Signed)
Care Coordination   Initial Visit Note   06/09/2023 Name: JOETTA MCDOWALL MRN: [id number] DOB: 1956-03-29  [name] is a 67 y.o. year old female who sees Alba Cory, MD for primary care. I spoke with  [name] by phone today.  What matters to the patients health and wellness today?  Patient report she is doing well, had breast biopsy yesterday but denies any urgent follow up from care manager.  She declines need for ongoing calls, but will call if she has questions/concerns.     Goals Addressed             This Visit's Progress    Care Coordination Activities, no follow up needed       Interventions Today    Flowsheet Row Most Recent Value  Chronic Disease   Chronic disease during today's visit Hypertension (HTN), Diabetes  General Interventions   General Interventions Discussed/Reviewed General Interventions Reviewed, Labs, Doctor Visits, Health Screening  Labs Hgb A1c annually  Doctor Visits Discussed/Reviewed Doctor Visits Reviewed, PCP, Specialist  Health Screening Mammogram  PCP/Specialist Visits Compliance with follow-up visit  Exercise Interventions   Exercise Discussed/Reviewed Physical Activity  Physical Activity Discussed/Reviewed Physical Activity Reviewed  Education Interventions   Education Provided Provided Education, Provided Web-based Education  Provided Verbal Education On Nutrition, Medication, When to see the doctor              SDOH assessments and interventions completed:  Yes  SDOH Interventions Today    Flowsheet Row Most Recent Value  SDOH Interventions   Food Insecurity Interventions Intervention Not Indicated  Housing Interventions Intervention Not Indicated  Transportation Interventions Intervention Not Indicated  Utilities Interventions Intervention Not Indicated        Care Coordination Interventions:  Yes, provided   Follow up plan: No further intervention required.   Encounter Outcome:  Patient Visit  Completed   Kemper Durie RN, MSN, CCM [place]  [facility], [facility] RN Care Coordinator Direct Dial: [phone] / Main [phone] Fax [phone] Email: [email or web address]: [email or web address]

## 2023-07-06 ENCOUNTER — Encounter: Payer: Self-pay | Admitting: Student in an Organized Health Care Education/Training Program

## 2023-07-20 DIAGNOSIS — G8929 Other chronic pain: Secondary | ICD-10-CM | POA: Diagnosis not present

## 2023-07-20 DIAGNOSIS — F332 Major depressive disorder, recurrent severe without psychotic features: Secondary | ICD-10-CM | POA: Diagnosis not present

## 2023-07-20 DIAGNOSIS — F411 Generalized anxiety disorder: Secondary | ICD-10-CM | POA: Diagnosis not present

## 2023-07-29 ENCOUNTER — Other Ambulatory Visit: Payer: Self-pay | Admitting: Family Medicine

## 2023-08-01 DIAGNOSIS — G8929 Other chronic pain: Secondary | ICD-10-CM | POA: Diagnosis not present

## 2023-08-01 DIAGNOSIS — F411 Generalized anxiety disorder: Secondary | ICD-10-CM | POA: Diagnosis not present

## 2023-08-01 DIAGNOSIS — F332 Major depressive disorder, recurrent severe without psychotic features: Secondary | ICD-10-CM | POA: Diagnosis not present

## 2023-08-05 DIAGNOSIS — F332 Major depressive disorder, recurrent severe without psychotic features: Secondary | ICD-10-CM | POA: Diagnosis not present

## 2023-08-05 DIAGNOSIS — F411 Generalized anxiety disorder: Secondary | ICD-10-CM | POA: Diagnosis not present

## 2023-08-05 DIAGNOSIS — G8929 Other chronic pain: Secondary | ICD-10-CM | POA: Diagnosis not present

## 2023-08-16 DIAGNOSIS — F332 Major depressive disorder, recurrent severe without psychotic features: Secondary | ICD-10-CM | POA: Diagnosis not present

## 2023-08-16 DIAGNOSIS — G8929 Other chronic pain: Secondary | ICD-10-CM | POA: Diagnosis not present

## 2023-08-16 DIAGNOSIS — F411 Generalized anxiety disorder: Secondary | ICD-10-CM | POA: Diagnosis not present

## 2023-08-23 DIAGNOSIS — R0902 Hypoxemia: Secondary | ICD-10-CM | POA: Diagnosis not present

## 2023-08-23 DIAGNOSIS — R0609 Other forms of dyspnea: Secondary | ICD-10-CM | POA: Diagnosis not present

## 2023-08-23 DIAGNOSIS — G4733 Obstructive sleep apnea (adult) (pediatric): Secondary | ICD-10-CM | POA: Diagnosis not present

## 2023-08-23 DIAGNOSIS — J449 Chronic obstructive pulmonary disease, unspecified: Secondary | ICD-10-CM | POA: Diagnosis not present

## 2023-08-23 DIAGNOSIS — J984 Other disorders of lung: Secondary | ICD-10-CM | POA: Diagnosis not present

## 2023-08-25 ENCOUNTER — Ambulatory Visit
Payer: Medicare HMO | Attending: Student in an Organized Health Care Education/Training Program | Admitting: Student in an Organized Health Care Education/Training Program

## 2023-08-25 ENCOUNTER — Encounter: Payer: Self-pay | Admitting: Student in an Organized Health Care Education/Training Program

## 2023-08-25 VITALS — BP 115/77 | HR 83 | Temp 97.2°F | Resp 16 | Ht 63.75 in | Wt 187.0 lb

## 2023-08-25 DIAGNOSIS — M5412 Radiculopathy, cervical region: Secondary | ICD-10-CM | POA: Insufficient documentation

## 2023-08-25 DIAGNOSIS — M5416 Radiculopathy, lumbar region: Secondary | ICD-10-CM | POA: Diagnosis not present

## 2023-08-25 DIAGNOSIS — M542 Cervicalgia: Secondary | ICD-10-CM | POA: Diagnosis not present

## 2023-08-25 DIAGNOSIS — G894 Chronic pain syndrome: Secondary | ICD-10-CM | POA: Diagnosis not present

## 2023-08-25 DIAGNOSIS — G8929 Other chronic pain: Secondary | ICD-10-CM | POA: Diagnosis not present

## 2023-08-25 MED ORDER — HYDROCODONE-ACETAMINOPHEN 10-325 MG PO TABS: 1.0000 | ORAL_TABLET | Freq: Three times a day (TID) | ORAL | 0 refills | Status: DC | PRN

## 2023-08-25 MED ORDER — HYDROCODONE-ACETAMINOPHEN 10-325 MG PO TABS: 1.0000 | ORAL_TABLET | Freq: Three times a day (TID) | ORAL | 0 refills | Status: AC | PRN

## 2023-08-25 NOTE — Progress Notes (Signed)
Nursing Pain Medication Assessment:  Safety precautions to be maintained throughout the outpatient stay will include: orient to surroundings, keep bed in low position, maintain call bell within reach at all times, provide assistance with transfer out of bed and ambulation.  Medication Inspection Compliance: Pill count conducted under aseptic conditions, in front of the patient. Neither the pills nor the bottle was removed from the patient's sight at any time. Once count was completed pills were immediately returned to the patient in their original bottle.  Medication: Hydrocodone/APAP Pill/Patch Count:  39 of 90 pills remain Pill/Patch Appearance: Markings consistent with prescribed medication Bottle Appearance: Standard pharmacy container. Clearly labeled. Filled Date: 46 / 28 / 2024 Last Medication intake:  Today

## 2023-08-25 NOTE — Progress Notes (Signed)
PROVIDER NOTE: Information contained herein reflects review and annotations entered in association with encounter. Interpretation of such information and data should be left to medically-trained personnel. Information provided to patient can be located elsewhere in the medical record under "Patient Instructions". Document created using STT-dictation technology, any transcriptional errors that may result from process are unintentional.    Patient: Judy Allen  Service Category: E/M  Provider: Edward Jolly, MD  DOB: 1955/12/13  DOS: 08/25/2023  Referring Provider: Alba Cory, MD  MRN: [id number]  Specialty: Interventional Pain Management  PCP: Alba Cory, MD  Type: Established Patient  Setting: Ambulatory outpatient    Location: Office  Delivery: Face-to-face     HPI  Judy Allen, a 68 y.o. year old female, is here today because of her Chronic pain syndrome [G89.4]. Judy Allen primary complain today is Neck Pain (Left ) Last encounter: My last encounter with her was on 05/31/23 Pertinent problems: Judy Allen has Chronic recurrent major depressive disorder (HCC); GAD (generalized anxiety disorder); Myofascial pain syndrome, cervical; Chronic pain syndrome; Cervical facet joint syndrome; Cervical radiculopathy (left); and Osteoarthritis of spine with radiculopathy, cervical region on their pertinent problem list. Pain Assessment: Severity of Chronic pain is reported as a 0-No pain/10. Location: Neck Left/down left arm. Onset: More than a month ago. Quality: Tingling, Discomfort. Timing: Intermittent. Modifying factor(s): pain medications. Vitals:  height is 5' 3.75" (1.619 m) and weight is 187 lb (84.8 kg). Her temporal temperature is 97.2 F (36.2 C) (abnormal). Her blood pressure is 115/77 and her pulse is 83. Her respiration is 16 and oxygen saturation is 92%.   Reason for encounter: medication management. No change in medical history since last visit.  Patient's pain is at baseline.   Patient continues multimodal pain regimen as prescribed.  States that it provides pain relief and improvement in functional status.    Pharmacotherapy Assessment  Analgesic: Hydrocodone 10 mg 3 times daily as needed  Monitoring: [place] PMP: PDMP reviewed during this encounter.       Pharmacotherapy: No side-effects or adverse reactions reported. Compliance: No problems identified. Effectiveness: Clinically acceptable.  Vernie Ammons, RN  08/25/2023 11:16 AM  Sign when Signing Visit Nursing Pain Medication Assessment:  Safety precautions to be maintained throughout the outpatient stay will include: orient to surroundings, keep bed in low position, maintain call bell within reach at all times, provide assistance with transfer out of bed and ambulation.  Medication Inspection Compliance: Pill count conducted under aseptic conditions, in front of the patient. Neither the pills nor the bottle was removed from the patient's sight at any time. Once count was completed pills were immediately returned to the patient in their original bottle.  Medication: Hydrocodone/APAP Pill/Patch Count:  39 of 90 pills remain Pill/Patch Appearance: Markings consistent with prescribed medication Bottle Appearance: Standard pharmacy container. Clearly labeled. Filled Date: 40 / 28 / 2024 Last Medication intake:  Today    No results found for: "CBDTHCR" No results found for: "D8THCCBX" No results found for: "D9THCCBX"  UDS:  Summary  Date Value Ref Range Status  03/08/2023 Note  Final    Comment:    ==================================================================== [employer] Select 13 (MW) ==================================================================== Test                             Result       Flag       Units  Drug Present and Declared for Prescription Verification   Hydrocodone  3516         EXPECTED   ng/mg creat   Hydromorphone                  916          EXPECTED    ng/mg creat   Dihydrocodeine                 259          EXPECTED   ng/mg creat   Norhydrocodone                 1391         EXPECTED   ng/mg creat    Sources of hydrocodone include scheduled prescription medications.    Hydromorphone, dihydrocodeine and norhydrocodone are expected    metabolites of hydrocodone. Hydromorphone and dihydrocodeine are    also available as scheduled prescription medications.  ==================================================================== Test                      Result    Flag   Units      Ref Range   Creatinine              32               mg/dL      >=44 ==================================================================== Declared Medications:  The flagging and interpretation on this report are based on the  following declared medications.  Unexpected results may arise from  inaccuracies in the declared medications.   **Note: The testing scope of this panel includes these medications:   Hydrocodone (Norco)   **Note: The testing scope of this panel does not include the  following reported medications:   Acetaminophen (Norco)  Albuterol  Anastrozole (Arimidex)  Buspirone (Buspar)  Cholecalciferol  Cyclobenzaprine (Flexeril)  Gabapentin (Neurontin)  Helium  Metoprolol  Multivitamin  Nifedipine  Omega-3 Fatty Acids  Omeprazole  Oxygen  Potassium  Rosuvastatin  Telmisartan  Torsemide  Umeclidinium (Anoro)  Venlafaxine (Effexor)  Vilanterol (Anoro) ==================================================================== For clinical consultation, please call [phone]. ====================================================================       ROS  Constitutional: Denies any fever or chills Gastrointestinal: No reported hemesis, hematochezia, vomiting, or acute GI distress Musculoskeletal:  Cervicalgia Neurological: No reported episodes of acute onset apraxia, aphasia, dysarthria, agnosia, amnesia, paralysis, loss of  coordination, or loss of consciousness  Medication Review  HYDROcodone-acetaminophen, Multiple Vitamins-Minerals, NIFEdipine, Oxygen-Helium, Potassium Gluconate, Vitamin D3, albuterol, anastrozole, atenolol, clonazePAM, cyclobenzaprine, gabapentin, metoprolol succinate, omega-3 acid ethyl esters, omeprazole, rosuvastatin, telmisartan, torsemide, umeclidinium-vilanterol, and venlafaxine XR  History Review  Allergy: Judy Allen is allergic to aczone [dapsone], abilify [aripiprazole], atarax [hydroxyzine], hydrochlorothiazide, sulfa antibiotics, and sulfasalazine. Drug: Judy Allen  reports no history of drug use. Alcohol:  reports no history of alcohol use. Tobacco:  reports that she quit smoking about 17 years ago. Her smoking use included cigarettes. She started smoking about 60 years ago. She has a 42.4 pack-year smoking history. She has never used smokeless tobacco. Social: Judy Allen  reports that she quit smoking about 17 years ago. Her smoking use included cigarettes. She started smoking about 60 years ago. She has a 42.4 pack-year smoking history. She has never used smokeless tobacco. She reports that she does not drink alcohol and does not use drugs. Medical:  has a past medical history of Anal squamous cell carcinoma (HCC) (03/10/2016), Anemia, Anxiety, Aortic atherosclerosis (HCC), Arthritis, Breast cancer, left (HCC) (07/10/2022), Complication of anesthesia, Constipation, COPD (chronic obstructive pulmonary disease) (HCC),  Coronary artery calcification seen on CT scan, Depression, Diastolic dysfunction (06/08/2021), Emphysema of lung (HCC), GERD (gastroesophageal reflux disease), Goals of care, counseling/discussion (07/10/2022), H/O allergic rhinitis, Heart murmur, Hemorrhoid, History of fusion of cervical spine, History of kidney stones, Hyperlipidemia, Hyperparathyroidism (HCC), Hypertension, Macular degeneration, Methemoglobinemia (09/07/2016), Neck pain, chronic, Neuritis, OSA on CPAP  (09/12/2016), Ovarian failure, Oxygen dependent, Personal history of chemotherapy, Personal history of radiation therapy, Pneumonitis (2017), PONV (postoperative nausea and vomiting), Proteinuria, Psoriasis, Pulmonary HTN (HCC) (06/22/2005), Steatohepatitis, T2DM (type 2 diabetes mellitus) (HCC), Tachycardia, paroxysmal (HCC), and Urinary incontinence. Surgical: Judy Allen  has a past surgical history that includes Tubal ligation; Anterior fusion lumbar spine (1990); Mass excision (Left, 02/23/2016); Colonoscopy (2018); Portacath placement (Left, 03/12/2016); Cardiac catheterization (2009? ); Cardiac catheterization (05/2012); Port-a-cath removal (2018); Liver biopsy (N/A, 09/12/2017); Cholecystectomy (N/A, 09/12/2017); Trigger finger release (Right, 09/2018); Anterior cervical decomp/discectomy fusion (N/A, 2001); Tonsillectomy (Bilateral, 1959); Anterior and posterior repair (N/A, 12/28/2021); Vaginal hysterectomy (N/A, 12/28/2021); Breast biopsy (Left, 07/05/2022); Breast biopsy (Left, 07/05/2022); Breast biopsy (Left, 07/22/2022); Part mastectomy,radio frequency localizer,axillary sentinel node biopsy (Left, 07/26/2022); Breast biopsy (Left, 06/08/2023); and Breast biopsy (Left, 06/08/2023). Family: family history includes Asthma in her mother; Breast cancer in her cousin and maternal aunt; Cancer in her brother; Heart attack in her mother; Liver cancer (age of onset: 53) in her father.  Laboratory Chemistry Profile   Renal Lab Results  Component Value Date   BUN 14 06/01/2023   CREATININE 0.73 06/01/2023   LABCREA 127 05/16/2023   BCR SEE NOTE: 05/16/2023   GFRAA 75 04/23/2019   GFRNONAA >60 06/01/2023    Hepatic Lab Results  Component Value Date   AST 52 (H) 06/01/2023   ALT 37 06/01/2023   ALBUMIN 4.6 06/01/2023   ALKPHOS 128 (H) 06/01/2023   LIPASE 27 08/03/2017    Electrolytes Lab Results  Component Value Date   NA 138 06/01/2023   K 3.6 06/01/2023   CL 101 06/01/2023   CALCIUM  9.7 06/01/2023    Bone Lab Results  Component Value Date   VD25OH 57 07/22/2021    Inflammation (CRP: Acute Phase) (ESR: Chronic Phase) Lab Results  Component Value Date   CRP 25.5 (H) 05/20/2023         Note: Above Lab results reviewed.  Recent Imaging Review  [place] LT BREAST BX W LOC DEV 1ST LESION IMG BX SPEC US GUIDE Addendum: ADDENDUM REPORT: 06/09/2023 12:49   ADDENDUM:  Pathology revealed REACTIVE INTRAMAMMARY LYMPH NODE of the LEFT  breast, 2 o'clock, 10 cmfn, (heart clip). This was found to be  concordant by Dr. Meda Klinefelter.   Pathology results were discussed with the patient by telephone. The  patient reported doing well after the biopsy with minimal tenderness  at the site. Post biopsy instructions and care were reviewed and  questions were answered. The patient was encouraged to call [facility] for any additional  concerns.   The patient was asked to return for BILATERAL diagnostic mammography  with possible ultrasound in October 2025.   Pathology results reported by Rene Kocher, RN on 06/09/2023.   Electronically Signed    By: Meda Klinefelter M.D.    On: 06/09/2023 12:49 Narrative: CLINICAL DATA:  Indeterminate LEFT breast mass, possibly an enlarging intramammary lymph node. History of ipsilateral malignancy.  EXAM: ULTRASOUND GUIDED LEFT BREAST CORE NEEDLE BIOPSY  COMPARISON:  Previous exam(s).  PROCEDURE: I met with the patient and we discussed the procedure of ultrasound-guided biopsy,  including benefits and alternatives. We discussed the high likelihood of a successful procedure. We discussed the risks of the procedure, including infection, bleeding, tissue injury, clip migration, and inadequate sampling. Informed written consent was given. The usual time-out protocol was performed immediately prior to the procedure.  Lesion quadrant: Upper outer quadrant  Using sterile technique and 1% lidocaine  and 1% lidocaine with epinephrine as local anesthetic, under direct ultrasound visualization, a 14 gauge spring-loaded device was used to perform biopsy of a mass at 2 o'clock using a lateral approach. At the conclusion of the procedure a heart shaped tissue marker clip was deployed into the biopsy cavity. Follow up 2 view mammogram was performed and dictated separately.  IMPRESSION: Ultrasound guided biopsy of a LEFT breast mass. No apparent complications.  Electronically Signed: By: Meda Klinefelter M.D. On: 06/08/2023 09:31 Note: Reviewed        Physical Exam  General appearance: Well nourished, well developed, and well hydrated. In no apparent acute distress Mental status: Alert, oriented x 3 (person, place, & time)       Respiratory: No evidence of acute respiratory distress on home oxygen 2 L Eyes: PERLA Vitals: BP 115/77 (BP Location: Right Arm, Patient Position: Sitting, Cuff Size: Normal)   Pulse 83   Temp (!) 97.2 F (36.2 C) (Temporal)   Resp 16   Ht 5' 3.75" (1.619 m)   Wt 187 lb (84.8 kg)   LMP 03/04/2002 (Approximate)   SpO2 92% Comment: 2 liters.  typically runs between uppers 80's and low 90's  COPD  BMI 32.35 kg/m  BMI: Estimated body mass index is 32.35 kg/m as calculated from the following:   Height as of this encounter: 5' 3.75" (1.619 m).   Weight as of this encounter: 187 lb (84.8 kg). Ideal: Ideal body weight: 54.1 kg (119 lb 5.2 oz) Adjusted ideal body weight: 66.4 kg (146 lb 6.3 oz)  Cervicalgia with pain radiation into bilateral upper extremity  5 out of 5 strength bilateral lower extremity: Plantar flexion, dorsiflexion, knee flexion, knee extension.   Assessment   Diagnosis Status  1. Chronic pain syndrome   2. Cervical radiculopathy (left)   3. Lumbar radiculopathy   4. Chronic cervical pain   5. Chronic radicular lumbar pain     Persistent Persistent Persistent     Plan of Care  Problem-specific:  No problem-specific  Assessment & Plan notes found for this encounter.  Judy Allen has a current medication list which includes the following long-term medication(s): clonazepam, gabapentin, omega-3 acid ethyl esters, omeprazole, potassium gluconate, rosuvastatin, venlafaxine xr, venlafaxine xr, and atenolol.  Pharmacotherapy (Medications Ordered): Meds ordered this encounter  Medications   HYDROcodone-acetaminophen (NORCO) 10-325 MG tablet    Sig: Take 1 tablet by mouth every 8 (eight) hours as needed for severe pain (pain score 7-10). Must last 30 days.    Dispense:  90 tablet    Refill:  0    Chronic Pain: STOP Act (Not applicable) Fill 1 day early if closed on refill date. Avoid benzodiazepines within 8 hours of opioids   HYDROcodone-acetaminophen (NORCO) 10-325 MG tablet    Sig: Take 1 tablet by mouth every 8 (eight) hours as needed for severe pain (pain score 7-10). Must last 30 days.    Dispense:  90 tablet    Refill:  0    Chronic Pain: STOP Act (Not applicable) Fill 1 day early if closed on refill date. Avoid benzodiazepines within 8 hours of opioids  HYDROcodone-acetaminophen (NORCO) 10-325 MG tablet    Sig: Take 1 tablet by mouth every 8 (eight) hours as needed for severe pain (pain score 7-10). Must last 30 days.    Dispense:  90 tablet    Refill:  0    Chronic Pain: STOP Act (Not applicable) Fill 1 day early if closed on refill date. Avoid benzodiazepines within 8 hours of opioids   Continue Gabapentin as Rx'd  No orders of the defined types were placed in this encounter.    Follow-up plan:   Return in about 14 weeks (around 12/01/2023) for MM, F2F.    Recent Visits Date Type Provider Dept  05/31/23 Office Visit Edward Jolly, MD [facility]  Showing recent visits within past 90 days and meeting all other requirements Today's Visits Date Type Provider Dept  08/25/23 Office Visit Edward Jolly, MD [facility]  Showing today's visits and meeting all other  requirements Future Appointments No visits were found meeting these conditions. Showing future appointments within next 90 days and meeting all other requirements  I discussed the assessment and treatment plan with the patient. The patient was provided an opportunity to ask questions and all were answered. The patient agreed with the plan and demonstrated an understanding of the instructions.  Patient advised to call back or seek an in-person evaluation if the symptoms or condition worsens.  Duration of encounter: .  Total time on encounter, as per AMA guidelines included both the face-to-face and non-face-to-face time personally spent by the physician and/or other qualified health care professional(s) on the day of the encounter (includes time in activities that require the physician or other qualified health care professional and does not include time in activities normally performed by clinical staff). Physician's time may include the following activities when performed: preparing to see the patient (eg, review of tests, pre-charting review of records) obtaining and/or reviewing separately obtained history performing a medically appropriate examination and/or evaluation counseling and educating the patient/family/caregiver ordering medications, tests, or procedures referring and communicating with other health care professionals (when not separately reported) documenting clinical information in the electronic or other health record independently interpreting results (not separately reported) and communicating results to the patient/ family/caregiver care coordination (not separately reported)  Note by: Edward Jolly, MD Date: 08/25/2023; Time: 11:34 AM

## 2023-08-30 DIAGNOSIS — L400 Psoriasis vulgaris: Secondary | ICD-10-CM | POA: Diagnosis not present

## 2023-08-30 DIAGNOSIS — L408 Other psoriasis: Secondary | ICD-10-CM | POA: Diagnosis not present

## 2023-08-31 DIAGNOSIS — G8929 Other chronic pain: Secondary | ICD-10-CM | POA: Diagnosis not present

## 2023-08-31 DIAGNOSIS — F411 Generalized anxiety disorder: Secondary | ICD-10-CM | POA: Diagnosis not present

## 2023-08-31 DIAGNOSIS — F332 Major depressive disorder, recurrent severe without psychotic features: Secondary | ICD-10-CM | POA: Diagnosis not present

## 2023-09-27 DIAGNOSIS — F411 Generalized anxiety disorder: Secondary | ICD-10-CM | POA: Diagnosis not present

## 2023-09-27 DIAGNOSIS — G8929 Other chronic pain: Secondary | ICD-10-CM | POA: Diagnosis not present

## 2023-09-27 DIAGNOSIS — F332 Major depressive disorder, recurrent severe without psychotic features: Secondary | ICD-10-CM | POA: Diagnosis not present

## 2023-10-17 ENCOUNTER — Ambulatory Visit: Payer: Medicare HMO | Admitting: Family Medicine

## 2023-10-19 DIAGNOSIS — G8929 Other chronic pain: Secondary | ICD-10-CM | POA: Diagnosis not present

## 2023-10-19 DIAGNOSIS — F411 Generalized anxiety disorder: Secondary | ICD-10-CM | POA: Diagnosis not present

## 2023-10-19 DIAGNOSIS — F332 Major depressive disorder, recurrent severe without psychotic features: Secondary | ICD-10-CM | POA: Diagnosis not present

## 2023-10-20 ENCOUNTER — Other Ambulatory Visit: Payer: Self-pay | Admitting: Family Medicine

## 2023-10-26 ENCOUNTER — Ambulatory Visit: Admitting: Family Medicine

## 2023-10-26 LAB — HM DIABETES EYE EXAM

## 2023-10-26 LAB — OPHTHALMOLOGY REPORT-SCANNED

## 2023-11-03 ENCOUNTER — Encounter: Payer: Self-pay | Admitting: Family Medicine

## 2023-11-03 ENCOUNTER — Ambulatory Visit: Admitting: Family Medicine

## 2023-11-03 VITALS — BP 118/68 | HR 91 | Resp 16 | Ht 63.75 in | Wt 183.9 lb

## 2023-11-03 DIAGNOSIS — F339 Major depressive disorder, recurrent, unspecified: Secondary | ICD-10-CM | POA: Diagnosis not present

## 2023-11-03 DIAGNOSIS — R197 Diarrhea, unspecified: Secondary | ICD-10-CM

## 2023-11-03 DIAGNOSIS — E213 Hyperparathyroidism, unspecified: Secondary | ICD-10-CM

## 2023-11-03 DIAGNOSIS — J432 Centrilobular emphysema: Secondary | ICD-10-CM

## 2023-11-03 DIAGNOSIS — J9611 Chronic respiratory failure with hypoxia: Secondary | ICD-10-CM

## 2023-11-03 DIAGNOSIS — K219 Gastro-esophageal reflux disease without esophagitis: Secondary | ICD-10-CM

## 2023-11-03 DIAGNOSIS — Z23 Encounter for immunization: Secondary | ICD-10-CM

## 2023-11-03 DIAGNOSIS — I272 Pulmonary hypertension, unspecified: Secondary | ICD-10-CM | POA: Diagnosis not present

## 2023-11-03 DIAGNOSIS — L408 Other psoriasis: Secondary | ICD-10-CM | POA: Diagnosis not present

## 2023-11-03 DIAGNOSIS — G894 Chronic pain syndrome: Secondary | ICD-10-CM | POA: Diagnosis not present

## 2023-11-03 DIAGNOSIS — I10 Essential (primary) hypertension: Secondary | ICD-10-CM | POA: Diagnosis not present

## 2023-11-03 DIAGNOSIS — E1169 Type 2 diabetes mellitus with other specified complication: Secondary | ICD-10-CM | POA: Diagnosis not present

## 2023-11-03 LAB — POCT GLYCOSYLATED HEMOGLOBIN (HGB A1C): Hemoglobin A1C: 6.2 % — AB (ref 4.0–5.6)

## 2023-11-03 MED ORDER — OMEGA-3-ACID ETHYL ESTERS 1 G PO CAPS: ORAL_CAPSULE | ORAL | 1 refills | Status: DC

## 2023-11-03 MED ORDER — ROSUVASTATIN CALCIUM 10 MG PO TABS: 10.0000 mg | ORAL_TABLET | Freq: Every day | ORAL | 1 refills | Status: DC

## 2023-11-03 MED ORDER — OMEPRAZOLE 40 MG PO CPDR: 40.0000 mg | DELAYED_RELEASE_CAPSULE | Freq: Every day | ORAL | 1 refills | Status: DC

## 2023-11-03 NOTE — Progress Notes (Signed)
 Name: Judy Allen   MRN: [id number]    DOB: [birth year]   Date:11/03/2023       Progress Note  Subjective  Chief Complaint  Chief Complaint  Patient presents with   Medical Management of Chronic Issues   HPI   History of anal cancer: patient was diagnosed with anal cancer in 2017, biopsy done by Dr. Evette Cristal and treated with radiation at [facility]. She has been released from [facility] . She has noticed some diarrhea lately, about 3 separate episodes of loose stools with mucus associated with low grade fever , lasts about 2 days and is so intense that cannot make it to the bathroom. Last episode was 2 weeks ago, we will check stools for PCR with next episode and refer back to GI - she will call them    Breast cancer - left side: found by abnormal mammogram and biopsy positive 07/05/2022 , invasive mammary carcinoma. She did not have radiation only taking Arimidex for 5 years . Mammogram is up to date    MDD: long history of depression, seeing psychiatrist at [facility] now, taking seroquel 25 mg BID and Effexor 300 mg daily , still feels depressed, but will get a new psychiatrist soon .    Hypertriglyceridemiareviewed last labs done in our office was 07/2023 LDL was up from 70 to  103 . Currently on  Lovaza and Crestor , last LDL was down to 25, triglycerides 207    HTN/Paroxysmal tachycardia: .she sees Dr. Austin Miles, she states she is still taking Telmisartan 80 mg , NIfedipine , metoprolol and torsemide   Hyperparathyroidism: diagnosed years ago and used to see Dr. Tedd Sias. She has been off HCTZ and last parathyroid hormone was still elevated but she is still asymptomatic.    Emphysema and hypoxemia/pulmonary hypertension: on oxygen 2  liters during the day and 4 liters when sleeping . She continues to have sob is stable and only with activity, denies current cough or wheezing  . Under the care of pulmonologist at [facility]. Symptoms are stable on Anoro  Summary 05/11/2023  1. The left ventricle is normal in  size with normal wall thickness.    2. The left ventricular systolic function is normal, LVEF is visually  estimated at > 55%.    3. The mitral valve leaflets are mildly thickened with normal leaflet  mobility.   4. The aortic valve is trileaflet with mildly thickened leaflets with normal  excursion.   5. The right ventricle is normal in size, with normal systolic function.    6. There is borderline pulmonary hypertension.    7. No significant change since prior study from 06/2022.      DMII: diagnosed 02/2018 by Dr. Tedd Sias, hgbA1C was at 6.6%, A1C  but has been off Metformin and A1C has been within normal limits, today A1C today is 6.2 % She has diabetes neuropathy - pain on both feet . She also has dyslipidemia , LDL is at goal with statin therapy. Neuropathy is stable on gabapentin    Chronic pain: under the care of Dr. Cherylann Ratel, she states she is doing well on Gabapentin , oxycodone and flexeril  .She has a history of laminectomy. She has neck and back pain, pain level right now is 1/10    Psoriasis/inverted : she is doing well,  under the care of dermatologist again     Patient Active Problem List   Diagnosis Date Noted   Pulmonary hypertension (HCC) 05/16/2023   Osteopenia 11/24/2022  Macular degeneration 08/30/2022   Genetic testing 08/20/2022   Breast cancer (HCC) 07/26/2022   Breast cancer, left (HCC) 07/10/2022   Invasive carcinoma of breast (HCC) 07/10/2022   Goals of care, counseling/discussion 07/10/2022   Cystocele with rectocele 12/28/2021   S/P vaginal hysterectomy 12/28/2021   Bilateral hip pain 11/03/2021   Hypotension due to drugs 05/03/2021   Disorder of SI (sacroiliac) joint 11/11/2020   Arthritis of sacroiliac joint of both sides (HCC) 11/11/2020   Chronic radicular lumbar pain 11/11/2020   Hyperparathyroidism (HCC) 08/25/2019   Type 2 diabetes mellitus with obesity (HCC) 11/06/2018   Cervical radiculopathy (left) 08/31/2018   Osteoarthritis of spine with  radiculopathy, cervical region 08/31/2018   History of anal cancer 05/30/2018   Myofascial pain syndrome, cervical 05/16/2018   Chronic pain syndrome 05/16/2018   Cervical facet joint syndrome 05/16/2018   Perihepatitis (HCC) 09/27/2017   Fatty liver 12/22/2016   Chronic respiratory failure with hypoxia, on home oxygen therapy (HCC) 07/28/2016   Centrilobular emphysema (HCC) 07/14/2016   Abnormal Pap smear of cervix 04/14/2016   Anal squamous cell carcinoma (HCC) 03/09/2016   Non-thrombocytopenic purpura (HCC) 01/12/2016   Chronic constipation 10/16/2015   History of fusion of cervical spine 04/02/2015   Benign hypertension 01/16/2015   Chronic cervical pain 01/16/2015   Constipation 01/16/2015   Gastric reflux 01/16/2015   Hypertriglyceridemia 01/16/2015   Edema leg 01/16/2015   Chronic recurrent major depressive disorder (HCC) 01/16/2015   Dysmetabolic syndrome 01/16/2015   Obstructive apnea 01/16/2015   Psoriasis 01/16/2015   Allergic rhinitis 01/16/2015   Bursitis, trochanteric 01/16/2015   GAD (generalized anxiety disorder) 01/16/2015   Tachycardia, paroxysmal (HCC)     Past Surgical History:  Procedure Laterality Date   ANTERIOR AND POSTERIOR REPAIR N/A 12/28/2021   Procedure: ANTERIOR (CYSTOCELE) AND POSTERIOR REPAIR (RECTOCELE);  Surgeon: Hildred Laser, MD;  Location: [facility];  Service: Gynecology;  Laterality: N/A;   ANTERIOR CERVICAL DECOMP/DISCECTOMY FUSION N/A 2001   PROCEDURE: ACDF C4-C7   ANTERIOR FUSION LUMBAR SPINE  1990   plate in back, not metal   BREAST BIOPSY Left 07/05/2022   [place] Bx, Ribbon Clip, Path Pending   BREAST BIOPSY Left 07/05/2022   [place] LT BREAST BX W LOC DEV 1ST LESION IMG BX SPEC US GUIDE 07/05/2022 [facility]   BREAST BIOPSY Left 07/22/2022   [place] LT RADIO FREQUENCY TAG LOC US GUIDE 07/22/2022 [facility]   BREAST BIOPSY Left 06/08/2023   u/s bx 2:00 10 cmfn heart clip, path pending   BREAST BIOPSY Left 06/08/2023   [place] LT BREAST  BX W LOC DEV 1ST LESION IMG BX SPEC US GUIDE 06/08/2023 [facility]   CARDIAC CATHETERIZATION  2009?    [facility]   CARDIAC CATHETERIZATION  05/2012   [facility]: Mild hypertension 37/12 with a mean pressure of 21 mm mercury   CHOLECYSTECTOMY N/A 09/12/2017   Procedure: LAPAROSCOPIC CHOLECYSTECTOMY WITH INTRAOPERATIVE CHOLANGIOGRAM;  Surgeon: Earline Mayotte, MD;  Location: [facility];  Service: General;  Laterality: N/A;   COLONOSCOPY  2018   LIVER BIOPSY N/A 09/12/2017   Procedure: LIVER BIOPSY;  Surgeon: Earline Mayotte, MD;  Location: [facility];  Service: General;  Laterality: N/A;   MASS EXCISION Left 02/23/2016   Procedure: EXCISION MASS;  Surgeon: Kieth Brightly, MD;  Location: [facility];  Service: General;  Laterality: Left;   PART MASTECTOMY,RADIO FREQUENCY LOCALIZER,AXILLARY SENTINEL NODE BIOPSY Left 07/26/2022   INVASIVE MAMMARY CARCINOMA, NO SPECIAL TYPE.  - DUCTAL CARCINOMA IN SITU, LOW GRADE margins  6mm closest anterior  Carolan Shiver   [facility] REMOVAL  2018   PORTACATH PLACEMENT Left 03/12/2016   Procedure: INSERTION PORT-A-CATH;  Surgeon: Kieth Brightly, MD;  Location: [facility];  Service: General;  Laterality: Left;   TONSILLECTOMY Bilateral 1959   TRIGGER FINGER RELEASE Right 09/2018   Dr. Hyacinth Meeker    TUBAL LIGATION     s/p   VAGINAL HYSTERECTOMY N/A 12/28/2021   Procedure: HYSTERECTOMY VAGINAL;  Surgeon: Hildred Laser, MD;  Location: [facility];  Service: Gynecology;  Laterality: N/A;    Family History  Problem Relation Age of Onset   Heart attack Mother    Asthma Mother    Liver cancer Father 27   Cancer Brother        unk type; metastatic   Breast cancer Maternal Aunt        dx 20s-30s   Breast cancer Cousin        dx 81s   Bladder Cancer Neg Hx    Kidney cancer Neg Hx    Colon cancer Neg Hx     Social History   Tobacco Use   Smoking status: Former    Current packs/day: 0.00    Average packs/day: 1 pack/day for 42.4 years (42.4  ttl pk-yrs)    Types: Cigarettes    Start date: 08/10/1963    Quit date: 01/15/2006    Years since quitting: 17.8   Smokeless tobacco: Never  Substance Use Topics   Alcohol use: No     Current Outpatient Medications:    albuterol (PROVENTIL HFA;VENTOLIN HFA) 108 (90 Base) MCG/ACT inhaler, Inhale 2 puffs into the lungs every 6 (six) hours as needed for wheezing or shortness of breath., Disp: , Rfl:    anastrozole (ARIMIDEX) 1 MG tablet, Take 1 tablet (1 mg total) by mouth daily., Disp: 90 tablet, Rfl: 1   Cholecalciferol (VITAMIN D3) 1000 units CAPS, Take 1 capsule by mouth daily., Disp: , Rfl:    clobetasol cream (TEMOVATE) 0.05 %, Apply topically., Disp: , Rfl:    cyclobenzaprine (FLEXERIL) 5 MG tablet, Take 1 tablet (5 mg total) by mouth 3 (three) times daily as needed for muscle spasms., Disp: 90 tablet, Rfl: 5   desonide (DESOWEN) 0.05 % cream, Apply twice daily to affected areas on the face and skin folds (groin and under breasts)., Disp: , Rfl:    gabapentin (NEURONTIN) 400 MG capsule, Take 1 capsule (400 mg total) by mouth 3 (three) times daily., Disp: 90 capsule, Rfl: 5   HYDROcodone-acetaminophen (NORCO) 10-325 MG tablet, Take 1 tablet by mouth every 8 (eight) hours as needed for severe pain (pain score 7-10). Must last 30 days., Disp: 90 tablet, Rfl: 0   [START ON 11/04/2023] HYDROcodone-acetaminophen (NORCO) 10-325 MG tablet, Take 1 tablet by mouth every 8 (eight) hours as needed for severe pain (pain score 7-10). Must last 30 days., Disp: 90 tablet, Rfl: 0   metoprolol succinate (TOPROL-XL) 50 MG 24 hr tablet, Take 1 tablet by mouth daily., Disp: , Rfl:    Multiple Vitamins-Minerals (EYE VITAMINS & MINERALS PO), Take 1 capsule by mouth in the morning. [employer] Eye Vitamins, Disp: , Rfl:    NIFEdipine (PROCARDIA-XL/NIFEDICAL-XL) 30 MG 24 hr tablet, Take 30 mg by mouth at bedtime., Disp: , Rfl:    OXYGEN, Inhale 2-4 L into the lungs continuous. 2 L qd and 4 L qhs, Disp: , Rfl:     Potassium Gluconate 80 MG TABS, Take 1 tablet by mouth daily., Disp: , Rfl:  QUEtiapine (SEROQUEL) 25 MG tablet, Take 25 mg by mouth 2 (two) times daily., Disp: , Rfl:    telmisartan (MICARDIS) 80 MG tablet, Take 80 mg by mouth daily., Disp: , Rfl:    torsemide (DEMADEX) 5 MG tablet, Take 5 mg by mouth every morning., Disp: , Rfl:    umeclidinium-vilanterol (ANORO ELLIPTA) 62.5-25 MCG/ACT AEPB, Inhale 1 puff into the lungs in the morning., Disp: , Rfl:    venlafaxine XR (EFFEXOR-XR) 75 MG 24 hr capsule, Take 1 capsule (75 mg total) by mouth 3 (three) times daily. (Patient taking differently: Take 75 mg by mouth 4 (four) times daily.), Disp: 270 capsule, Rfl: 1   omega-3 acid ethyl esters (LOVAZA) 1 g capsule, TAKE 2 CAPSULES BY MOUTH TWO TIMES A DAY, Disp: 360 capsule, Rfl: 1   omeprazole (PRILOSEC) 40 MG capsule, Take 1 capsule (40 mg total) by mouth daily., Disp: 90 capsule, Rfl: 1   rosuvastatin (CRESTOR) 10 MG tablet, Take 1 tablet (10 mg total) by mouth daily., Disp: 90 tablet, Rfl: 1  Allergies  Allergen Reactions   Aczone [Dapsone] Other (See Comments)    Hypoxemia   Abilify [Aripiprazole]     Tardive dyskinesis    Atarax [Hydroxyzine]     Hypoxemia    Hydrochlorothiazide     History of hyperparathyroidism    Sulfa Antibiotics Rash   Sulfasalazine Rash    I personally reviewed active problem list, medication list, allergies, family history with the patient/caregiver today.   ROS  Ten systems reviewed and is negative except as mentioned in HPI    Objective Physical Exam Constitutional: Patient appears well-developed and well-nourished. Obese  No distress.  HEENT: head atraumatic, normocephalic, pupils equal and reactive to light, neck supple Cardiovascular: Normal rate, regular rhythm and normal heart sounds.  No murmur heard. No BLE edema. Pulmonary/Chest: Effort normal and breath sounds normal. No respiratory distress. Abdominal: Soft.  There is no  tenderness. Psychiatric: Patient has a normal mood and affect. behavior is normal. Judgment and thought content normal.   Vitals:   11/03/23 0922  BP: 118/68  Pulse: 91  Resp: 16  SpO2: 92%  Weight: 183 lb 14.4 oz (83.4 kg)  Height: 5' 3.75" (1.619 m)    Body mass index is 31.81 kg/m.  Recent Results (from the past 2160 hours)  POCT glycosylated hemoglobin (Hb A1C)     Status: Abnormal   Collection Time: 11/03/23  9:26 AM  Result Value Ref Range   Hemoglobin A1C 6.2 (A) 4.0 - 5.6 %   HbA1c POC (<> result, manual entry)     HbA1c, POC (prediabetic range)     HbA1c, POC (controlled diabetic range)      Diabetic Foot Exam:     PHQ2/9:    11/03/2023    9:19 AM 05/16/2023    2:41 PM 12/31/2022    3:48 PM 12/09/2022    2:02 PM 09/13/2022    1:09 PM  Depression screen PHQ 2/9  Decreased Interest 3 0 2 0 0  Down, Depressed, Hopeless 3 0 2 0 0  PHQ - 2 Score 6 0 4 0 0  Altered sleeping 3 0 0    Tired, decreased energy 3 0 2    Change in appetite 0 0 3    Feeling bad or failure about yourself  0 0 1    Trouble concentrating 3 0 0    Moving slowly or fidgety/restless 0 0 0    Suicidal thoughts 0 0 0  PHQ-9 Score 15 0 10    Difficult doing work/chores Very difficult  Not difficult at all      phq 9 is positive  Fall Risk:    11/03/2023    9:19 AM 08/25/2023   11:10 AM 05/31/2023   11:20 AM 05/16/2023    2:41 PM 03/08/2023    9:31 AM  Fall Risk   Falls in the past year? 0 0 0 0 0  Number falls in past yr: 0 0  0   Injury with Fall? 0   0   Risk for fall due to : No Fall Risks No Fall Risks  No Fall Risks   Follow up Falls prevention discussed;Education provided;Falls evaluation completed Falls evaluation completed  Falls prevention discussed      Assessment & Plan  1. Dyslipidemia associated with type 2 diabetes mellitus (HCC) (Primary)  - POCT glycosylated hemoglobin (Hb A1C) - HM Diabetes Foot Exam - omega-3 acid ethyl esters (LOVAZA) 1 g capsule; TAKE 2  CAPSULES BY MOUTH TWO TIMES A DAY  Dispense: 360 capsule; Refill: 1 - rosuvastatin (CRESTOR) 10 MG tablet; Take 1 tablet (10 mg total) by mouth daily.  Dispense: 90 tablet; Refill: 1  2. Centrilobular emphysema (HCC)  Stable on oxygen and Anoro  3. Chronic respiratory failure with hypoxia, on home oxygen therapy (HCC)  On oxygen  4. Hyperparathyroidism [facility])  Discussed results with patient, not having symptoms does not want to see Dr. Tedd Sias at this time  5. Chronic recurrent major depressive disorder (HCC)  - QUEtiapine (SEROQUEL) 25 MG tablet; Take 25 mg by mouth 2 (two) times daily.  6. Pulmonary hypertension (HCC)  Managed by pulmonologist   7. Inverse psoriasis  - desonide (DESOWEN) 0.05 % cream; Apply twice daily to affected areas on the face and skin folds (groin and under breasts). - clobetasol cream (TEMOVATE) 0.05 %; Apply topically.  8. Benign hypertension  - metoprolol succinate (TOPROL-XL) 50 MG 24 hr tablet; Take 1 tablet by mouth daily.  9. Chronic pain syndrome  Keep visits with Dr. Cherylann Ratel  10. Diarrhea, unspecified type  We will check stoll PCR, contact GI for follow up  11. Gastroesophageal reflux disease without esophagitis  - omeprazole (PRILOSEC) 40 MG capsule; Take 1 capsule (40 mg total) by mouth daily.  Dispense: 90 capsule; Refill: 1  12. Need for pneumococcal 20-valent conjugate vaccination  - Pneumococcal conjugate vaccine 20-valent (Prevnar 20)

## 2023-11-03 NOTE — Patient Instructions (Signed)
 Nuzum, Tommie Ard, MD  [address]  [address]  [place], [place] [address]  Phone: [phone]  Fax: [phone]

## 2023-11-16 DIAGNOSIS — F332 Major depressive disorder, recurrent severe without psychotic features: Secondary | ICD-10-CM | POA: Diagnosis not present

## 2023-11-16 DIAGNOSIS — G8929 Other chronic pain: Secondary | ICD-10-CM | POA: Diagnosis not present

## 2023-11-16 DIAGNOSIS — F411 Generalized anxiety disorder: Secondary | ICD-10-CM | POA: Diagnosis not present

## 2023-11-24 ENCOUNTER — Ambulatory Visit
Payer: Medicare HMO | Attending: Student in an Organized Health Care Education/Training Program | Admitting: Student in an Organized Health Care Education/Training Program

## 2023-11-24 ENCOUNTER — Encounter: Payer: Self-pay | Admitting: Student in an Organized Health Care Education/Training Program

## 2023-11-24 VITALS — BP 132/70 | HR 85 | Temp 97.9°F | Resp 17 | Ht 63.75 in | Wt 182.0 lb

## 2023-11-24 DIAGNOSIS — G894 Chronic pain syndrome: Secondary | ICD-10-CM | POA: Insufficient documentation

## 2023-11-24 DIAGNOSIS — Z79899 Other long term (current) drug therapy: Secondary | ICD-10-CM | POA: Insufficient documentation

## 2023-11-24 DIAGNOSIS — M542 Cervicalgia: Secondary | ICD-10-CM | POA: Diagnosis not present

## 2023-11-24 DIAGNOSIS — M5412 Radiculopathy, cervical region: Secondary | ICD-10-CM | POA: Diagnosis not present

## 2023-11-24 DIAGNOSIS — M5416 Radiculopathy, lumbar region: Secondary | ICD-10-CM | POA: Diagnosis not present

## 2023-11-24 DIAGNOSIS — G8929 Other chronic pain: Secondary | ICD-10-CM | POA: Diagnosis not present

## 2023-11-24 MED ORDER — HYDROCODONE-ACETAMINOPHEN 10-325 MG PO TABS: 1.0000 | ORAL_TABLET | Freq: Three times a day (TID) | ORAL | 0 refills | Status: DC | PRN

## 2023-11-24 MED ORDER — HYDROCODONE-ACETAMINOPHEN 10-325 MG PO TABS: 1.0000 | ORAL_TABLET | Freq: Three times a day (TID) | ORAL | 0 refills | Status: AC | PRN

## 2023-11-24 NOTE — Progress Notes (Signed)
 PROVIDER NOTE: Interpretation of information contained herein should be left to medically-trained personnel. Specific patient instructions are provided elsewhere under "Patient Instructions" section of medical record. This document was created in part using AI and STT-dictation technology, any transcriptional errors that may result from this process are unintentional.  Patient: Judy Allen  Service: E/M   PCP: Alba Cory, MD  DOB: 1955-08-31  DOS: 11/24/2023  Provider: Edward Jolly, MD  MRN: [id number]  Delivery: Face-to-face  Specialty: Interventional Pain Management  Type: Established Patient  Setting: Ambulatory outpatient facility  Specialty designation: 09  Referring Prov.: Alba Cory, MD  Location: Outpatient office facility       HPI  Ms. OSA FOGARTY, a 68 y.o. year old female, is here today because of her Chronic pain syndrome [G89.4]. Ms. Kleine primary complain today is Neck Pain (posterior)   Pain Assessment: Severity of Chronic pain is reported as a 5 /10. Location: Neck Posterior/reports tingling sensation down left arm to wrist and sometimes to fingers. Onset: More than a month ago. Quality: Tingling, Sharp, Burning. Timing: Constant. Modifying factor(s): meds. Vitals:  height is 5' 3.75" (1.619 m) and weight is 182 lb (82.6 kg). Her temperature is 97.9 F (36.6 C). Her blood pressure is 132/70 and her pulse is 85. Her respiration is 17 and oxygen saturation is 92%.  BMI: Estimated body mass index is 31.49 kg/m as calculated from the following:   Height as of this encounter: 5' 3.75" (1.619 m).   Weight as of this encounter: 182 lb (82.6 kg). Last encounter: 08/25/2023.  Reason for encounter: medication management.  The patient doing well with current medication regimen.  No adverse reaction or side effects reported to the medication. She complains of her neck pain that radiates to her bilateral arm. We offered her joint injection however she is not ready for joint  injection.  Advise her in the future if she wants to consider joint injection that help to reduce pain and inflammation around her neck area.   Pharmacotherapy Assessment  Analgesic: Hydrocodone-acetaminophen 10-325 mg every 8 hours as needed for severe pain. MME=30 Monitoring: [place] PMP: PDMP reviewed during this encounter.       Pharmacotherapy: No side-effects or adverse reactions reported. Compliance: No problems identified. Effectiveness: Clinically acceptable.  Nonah Mattes, RN  11/24/2023  2:33 PM  Sign when Signing Visit Nursing Pain Medication Assessment:  Safety precautions to be maintained throughout the outpatient stay will include: orient to surroundings, keep bed in low position, maintain call bell within reach at all times, provide assistance with transfer out of bed and ambulation.  Medication Inspection Compliance: Pill count conducted under aseptic conditions, in front of the patient. Neither the pills nor the bottle was removed from the patient's sight at any time. Once count was completed pills were immediately returned to the patient in their original bottle.  Medication: Hydrocodone/APAP Pill/Patch Count:  30 of 90 pills remain Pill/Patch Appearance: Markings consistent with prescribed medication Bottle Appearance: Standard pharmacy container. Clearly labeled. Filled Date: 3 / 30 / 2025 Last Medication intake:  Today    No results found for: "CBDTHCR" No results found for: "D8THCCBX" No results found for: "D9THCCBX"  UDS:  Summary  Date Value Ref Range Status  03/08/2023 Note  Final    Comment:    ==================================================================== [employer] Select 13 (MW) ==================================================================== Test  Result       Flag       Units  Drug Present and Declared for Prescription Verification   Hydrocodone                    3516         EXPECTED   ng/mg creat   Hydromorphone                   916          EXPECTED   ng/mg creat   Dihydrocodeine                 259          EXPECTED   ng/mg creat   Norhydrocodone                 1391         EXPECTED   ng/mg creat    Sources of hydrocodone include scheduled prescription medications.    Hydromorphone, dihydrocodeine and norhydrocodone are expected    metabolites of hydrocodone. Hydromorphone and dihydrocodeine are    also available as scheduled prescription medications.  ==================================================================== Test                      Result    Flag   Units      Ref Range   Creatinine              32               mg/dL      >=57 ==================================================================== Declared Medications:  The flagging and interpretation on this report are based on the  following declared medications.  Unexpected results may arise from  inaccuracies in the declared medications.   **Note: The testing scope of this panel includes these medications:   Hydrocodone (Norco)   **Note: The testing scope of this panel does not include the  following reported medications:   Acetaminophen (Norco)  Albuterol  Anastrozole (Arimidex)  Buspirone (Buspar)  Cholecalciferol  Cyclobenzaprine (Flexeril)  Gabapentin (Neurontin)  Helium  Metoprolol  Multivitamin  Nifedipine  Omega-3 Fatty Acids  Omeprazole  Oxygen  Potassium  Rosuvastatin  Telmisartan  Torsemide  Umeclidinium (Anoro)  Venlafaxine (Effexor)  Vilanterol (Anoro) ==================================================================== For clinical consultation, please call [phone]. ====================================================================       ROS  Constitutional: Denies any fever or chills Gastrointestinal: No reported hemesis, hematochezia, vomiting, or acute GI distress Musculoskeletal: neck pain radiates to bilateral arm.  No weakness reported. Neurological: No reported episodes of  acute onset apraxia, aphasia, dysarthria, agnosia, amnesia, paralysis, loss of coordination, or loss of consciousness  Medication Review  HYDROcodone-acetaminophen, Multiple Vitamins-Minerals, NIFEdipine, Oxygen-Helium, Potassium Gluconate, QUEtiapine, Vitamin D3, albuterol, anastrozole, clobetasol cream, cyclobenzaprine, desonide, gabapentin, metoprolol succinate, omega-3 acid ethyl esters, omeprazole, rosuvastatin, telmisartan, torsemide, umeclidinium-vilanterol, and venlafaxine XR  History Review  Allergy: Ms. Jarema is allergic to aczone [dapsone], abilify [aripiprazole], atarax [hydroxyzine], hydrochlorothiazide, sulfa antibiotics, and sulfasalazine. Drug: Ms. Wisman  reports no history of drug use. Alcohol:  reports no history of alcohol use. Tobacco:  reports that she quit smoking about 17 years ago. Her smoking use included cigarettes. She started smoking about 60 years ago. She has a 42.4 pack-year smoking history. She has never used smokeless tobacco. Social: Ms. Kierstead  reports that she quit smoking about 17 years ago. Her smoking use included cigarettes. She started smoking about 60 years ago. She has a 42.4 pack-year smoking  history. She has never used smokeless tobacco. She reports that she does not drink alcohol and does not use drugs. Medical:  has a past medical history of Anal squamous cell carcinoma (HCC) (03/10/2016), Anemia, Anxiety, Aortic atherosclerosis (HCC), Arthritis, Breast cancer, left (HCC) (07/10/2022), Complication of anesthesia, Constipation, COPD (chronic obstructive pulmonary disease) (HCC), Coronary artery calcification seen on CT scan, Depression, Diastolic dysfunction (06/08/2021), Emphysema of lung (HCC), GERD (gastroesophageal reflux disease), Goals of care, counseling/discussion (07/10/2022), H/O allergic rhinitis, Heart murmur, Hemorrhoid, History of fusion of cervical spine, History of kidney stones, Hyperlipidemia, Hyperparathyroidism (HCC), Hypertension, Macular  degeneration, 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 Surgical: Ms. Guthridge  has a past surgical history that includes Tubal ligation; Anterior fusion lumbar spine (1990); Mass excision (Left, 02/23/2016); Colonoscopy (2018); Portacath placement (Left, 03/12/2016); Cardiac catheterization (2009? ); Cardiac catheterization (05/2012); Port-a-cath removal (2018); Liver biopsy (N/A, 09/12/2017); Cholecystectomy (N/A, 09/12/2017); Trigger finger release (Right, 09/2018); Anterior cervical decomp/discectomy fusion (N/A, 2001); Tonsillectomy (Bilateral, 1959); Anterior and posterior repair (N/A, 12/28/2021); Vaginal hysterectomy (N/A, 12/28/2021); Breast biopsy (Left, 07/05/2022); Breast biopsy (Left, 07/05/2022); Breast biopsy (Left, 07/22/2022); Part mastectomy,radio frequency localizer,axillary sentinel node biopsy (Left, 07/26/2022); Breast biopsy (Left, 06/08/2023); and Breast biopsy (Left, 06/08/2023). Family: family history includes Asthma in her mother; Breast cancer in her cousin and maternal aunt; Cancer in her brother; Heart attack in her mother; Liver cancer (age of onset: 57) in her father.  Laboratory Chemistry Profile   Renal Lab Results  Component Value Date   BUN 14 06/01/2023   CREATININE 0.73 06/01/2023   LABCREA 127 05/16/2023   BCR SEE NOTE: 05/16/2023   GFRAA 75 04/23/2019   GFRNONAA >60 06/01/2023    Hepatic Lab Results  Component Value Date   AST 52 (H) 06/01/2023   ALT 37 06/01/2023   ALBUMIN 4.6 06/01/2023   ALKPHOS 128 (H) 06/01/2023   LIPASE 27 08/03/2017    Electrolytes Lab Results   Component Value Date   NA 138 06/01/2023   K 3.6 06/01/2023   CL 101 06/01/2023   CALCIUM 9.7 06/01/2023    Bone Lab Results  Component Value Date   VD25OH 57 07/22/2021    Inflammation (CRP: Acute Phase) (ESR: Chronic Phase) Lab Results  Component Value Date   CRP 25.5 (H) 05/20/2023         Note: Above Lab results reviewed.  Recent Imaging Review  [place] LT BREAST BX W LOC DEV 1ST LESION IMG BX SPEC US GUIDE Addendum: ADDENDUM REPORT: 06/09/2023 12:49   ADDENDUM:  Pathology revealed REACTIVE INTRAMAMMARY LYMPH NODE of the LEFT  breast, 2 o'clock, 10 cmfn, (heart clip). This was found to be  concordant by Dr. Meda Klinefelter.   Pathology results were discussed with the patient by telephone. The  patient reported doing well after the biopsy with minimal tenderness  at the site. Post biopsy instructions and care were reviewed and  questions were answered. The patient was encouraged to call [facility] for any additional  concerns.   The patient was asked to return for BILATERAL diagnostic mammography  with possible ultrasound in October 2025.   Pathology results reported by Rene Kocher, RN on 06/09/2023.   Electronically Signed    By: Judeth Cornfield  Peacock M.D.    On: 06/09/2023 12:49 Narrative: CLINICAL DATA:  Indeterminate LEFT breast mass, possibly an enlarging intramammary lymph node. History of ipsilateral malignancy.  EXAM: ULTRASOUND GUIDED LEFT BREAST CORE NEEDLE BIOPSY  COMPARISON:  Previous exam(s).  PROCEDURE: I met with the patient and we discussed the procedure of ultrasound-guided biopsy, including benefits and alternatives. We discussed the high likelihood of a successful procedure. We discussed the risks of the procedure, including infection, bleeding, tissue injury, clip migration, and inadequate sampling. Informed written consent was given. The usual time-out protocol was performed immediately prior to the  procedure.  Lesion quadrant: Upper outer quadrant  Using sterile technique and 1% lidocaine and 1% lidocaine with epinephrine as local anesthetic, under direct ultrasound visualization, a 14 gauge spring-loaded device was used to perform biopsy of a mass at 2 o'clock using a lateral approach. At the conclusion of the procedure a heart shaped tissue marker clip was deployed into the biopsy cavity. Follow up 2 view mammogram was performed and dictated separately.  IMPRESSION: Ultrasound guided biopsy of a LEFT breast mass. No apparent complications.  Electronically Signed: By: Clancy Crimes M.D. On: 06/08/2023 09:31 Note: Reviewed        Physical Exam  General appearance: Well nourished, well developed, and well hydrated. In no apparent acute distress Mental status: Alert, oriented x 3 (person, place, & time)       Respiratory: No evidence of acute respiratory distress Eyes: PERLA Vitals: BP 132/70   Pulse 85   Temp 97.9 F (36.6 C)   Resp 17   Ht 5' 3.75" (1.619 m)   Wt 182 lb (82.6 kg)   LMP 03/04/2002 (Approximate)   SpO2 92% Comment: on 3L O2 from home  BMI 31.49 kg/m  BMI: Estimated body mass index is 31.49 kg/m as calculated from the following:   Height as of this encounter: 5' 3.75" (1.619 m).   Weight as of this encounter: 182 lb (82.6 kg). Ideal: Ideal body weight: 54.1 kg (119 lb 5.2 oz) Adjusted ideal body weight: 65.5 kg (144 lb 6.3 oz)  Assessment   Diagnosis Status  1. Chronic pain syndrome   2. Cervical radiculopathy (left)   3. Lumbar radiculopathy   4. Chronic cervical pain   5. Chronic radicular lumbar pain   6. Medication management    Controlled Controlled Controlled   Plan of Care  Assessment and Plan Will continue on current medication regimen.  Recommendation for joint injection in future.   Pharmacotherapy (Medications Ordered): Meds ordered this encounter  Medications   HYDROcodone-acetaminophen (NORCO) 10-325 MG tablet     Sig: Take 1 tablet by mouth every 8 (eight) hours as needed for severe pain (pain score 7-10). Must last 30 days.    Dispense:  90 tablet    Refill:  0    Chronic Pain: STOP Act (Not applicable) Fill 1 day early if closed on refill date. Avoid benzodiazepines within 8 hours of opioids   HYDROcodone-acetaminophen (NORCO) 10-325 MG tablet    Sig: Take 1 tablet by mouth every 8 (eight) hours as needed for severe pain (pain score 7-10). Must last 30 days.    Dispense:  90 tablet    Refill:  0    Chronic Pain: STOP Act (Not applicable) Fill 1 day early if closed on refill date. Avoid benzodiazepines within 8 hours of opioids   HYDROcodone-acetaminophen (NORCO) 10-325 MG tablet    Sig: Take 1 tablet by mouth every 8 (eight) hours as needed for  severe pain (pain score 7-10). Must last 30 days.    Dispense:  90 tablet    Refill:  0    Chronic Pain: STOP Act (Not applicable) Fill 1 day early if closed on refill date. Avoid benzodiazepines within 8 hours of opioids   Orders:  No orders of the defined types were placed in this encounter.  Follow-up plan:   Return in about 3 months (around 02/23/2024) for (F2F), (MM), Marthe Slain NP.    Recent Visits No visits were found meeting these conditions. Showing recent visits within past 90 days and meeting all other requirements Today's Visits Date Type Provider Dept  11/24/23 Office Visit Cephus Collin, MD [facility]  Showing today's visits and meeting all other requirements Future Appointments Date Type Provider Dept  02/16/24 Appointment Loreen Bankson K, NP [facility]  Showing future appointments within next 90 days and meeting all other requirements  I discussed the assessment and treatment plan with the patient. The patient was provided an opportunity to ask questions and all were answered. The patient agreed with the plan and demonstrated an understanding of the instructions.  Patient advised to call back or seek an  in-person evaluation if the symptoms or condition worsens.  Duration of encounter: 30 minutes.  Total time on encounter, as per AMA guidelines included both the face-to-face and non-face-to-face time personally spent by the physician and/or other qualified health care professional(s) on the day of the encounter (includes time in activities that require the physician or other qualified health care professional and does not include time in activities normally performed by clinical staff). Physician's time may include the following activities when performed: Preparing to see the patient (e.g., pre-charting review of records, searching for previously ordered imaging, lab work, and nerve conduction tests) Review of prior analgesic pharmacotherapies. Reviewing PMP Interpreting ordered tests (e.g., lab work, imaging, nerve conduction tests) Performing post-procedure evaluations, including interpretation of diagnostic procedures Obtaining and/or reviewing separately obtained history Performing a medically appropriate examination and/or evaluation Counseling and educating the patient/family/caregiver Ordering medications, tests, or procedures Referring and communicating with other health care professionals (when not separately reported) Documenting clinical information in the electronic or other health record Independently interpreting results (not separately reported) and communicating results to the patient/ family/caregiver Care coordination (not separately reported)  Note by: Cephus Collin, MD (TTS and AI technology used. I apologize for any typographical errors that were not detected and corrected.) Date: 11/24/2023; Time: 3:50 PM

## 2023-11-24 NOTE — Progress Notes (Signed)
 Nursing Pain Medication Assessment:  Safety precautions to be maintained throughout the outpatient stay will include: orient to surroundings, keep bed in low position, maintain call bell within reach at all times, provide assistance with transfer out of bed and ambulation.  Medication Inspection Compliance: Pill count conducted under aseptic conditions, in front of the patient. Neither the pills nor the bottle was removed from the patient's sight at any time. Once count was completed pills were immediately returned to the patient in their original bottle.  Medication: Hydrocodone/APAP Pill/Patch Count:  30 of 90 pills remain Pill/Patch Appearance: Markings consistent with prescribed medication Bottle Appearance: Standard pharmacy container. Clearly labeled. Filled Date: 3 / 35 / 2025 Last Medication intake:  Today

## 2023-11-25 ENCOUNTER — Other Ambulatory Visit: Payer: Self-pay | Admitting: *Deleted

## 2023-11-25 ENCOUNTER — Encounter: Payer: Self-pay | Admitting: Student in an Organized Health Care Education/Training Program

## 2023-11-25 ENCOUNTER — Other Ambulatory Visit: Payer: Self-pay | Admitting: Student in an Organized Health Care Education/Training Program

## 2023-11-25 DIAGNOSIS — M5416 Radiculopathy, lumbar region: Secondary | ICD-10-CM

## 2023-11-25 DIAGNOSIS — K5900 Constipation, unspecified: Secondary | ICD-10-CM | POA: Diagnosis not present

## 2023-11-25 DIAGNOSIS — G8929 Other chronic pain: Secondary | ICD-10-CM

## 2023-11-25 DIAGNOSIS — M5412 Radiculopathy, cervical region: Secondary | ICD-10-CM

## 2023-11-25 DIAGNOSIS — Z0100 Encounter for examination of eyes and vision without abnormal findings: Secondary | ICD-10-CM | POA: Diagnosis not present

## 2023-11-25 DIAGNOSIS — G894 Chronic pain syndrome: Secondary | ICD-10-CM

## 2023-11-25 DIAGNOSIS — M6289 Other specified disorders of muscle: Secondary | ICD-10-CM | POA: Diagnosis not present

## 2023-11-25 DIAGNOSIS — H524 Presbyopia: Secondary | ICD-10-CM | POA: Diagnosis not present

## 2023-11-25 DIAGNOSIS — R159 Full incontinence of feces: Secondary | ICD-10-CM | POA: Diagnosis not present

## 2023-11-28 ENCOUNTER — Other Ambulatory Visit: Payer: Self-pay | Admitting: Nurse Practitioner

## 2023-11-28 MED ORDER — GABAPENTIN 400 MG PO CAPS: 400.0000 mg | ORAL_CAPSULE | Freq: Three times a day (TID) | ORAL | 5 refills | Status: DC

## 2023-11-30 ENCOUNTER — Other Ambulatory Visit: Payer: Medicare HMO

## 2023-11-30 ENCOUNTER — Ambulatory Visit: Payer: Medicare HMO | Admitting: Oncology

## 2023-12-06 DIAGNOSIS — F411 Generalized anxiety disorder: Secondary | ICD-10-CM | POA: Diagnosis not present

## 2023-12-06 DIAGNOSIS — F332 Major depressive disorder, recurrent severe without psychotic features: Secondary | ICD-10-CM | POA: Diagnosis not present

## 2023-12-06 DIAGNOSIS — G8929 Other chronic pain: Secondary | ICD-10-CM | POA: Diagnosis not present

## 2023-12-07 ENCOUNTER — Inpatient Hospital Stay

## 2023-12-07 ENCOUNTER — Inpatient Hospital Stay: Admitting: Oncology

## 2023-12-07 ENCOUNTER — Encounter: Payer: Self-pay | Admitting: Oncology

## 2023-12-07 ENCOUNTER — Inpatient Hospital Stay: Attending: Oncology

## 2023-12-07 VITALS — BP 122/71 | HR 89 | Temp 98.3°F | Resp 18 | Wt 182.0 lb

## 2023-12-07 DIAGNOSIS — R1900 Intra-abdominal and pelvic swelling, mass and lump, unspecified site: Secondary | ICD-10-CM | POA: Insufficient documentation

## 2023-12-07 DIAGNOSIS — R7989 Other specified abnormal findings of blood chemistry: Secondary | ICD-10-CM | POA: Insufficient documentation

## 2023-12-07 DIAGNOSIS — D649 Anemia, unspecified: Secondary | ICD-10-CM | POA: Insufficient documentation

## 2023-12-07 DIAGNOSIS — C210 Malignant neoplasm of anus, unspecified: Secondary | ICD-10-CM | POA: Diagnosis not present

## 2023-12-07 DIAGNOSIS — K7460 Unspecified cirrhosis of liver: Secondary | ICD-10-CM | POA: Insufficient documentation

## 2023-12-07 DIAGNOSIS — M8580 Other specified disorders of bone density and structure, unspecified site: Secondary | ICD-10-CM

## 2023-12-07 DIAGNOSIS — Z170 Estrogen receptor positive status [ER+]: Secondary | ICD-10-CM | POA: Diagnosis not present

## 2023-12-07 DIAGNOSIS — Z79811 Long term (current) use of aromatase inhibitors: Secondary | ICD-10-CM | POA: Insufficient documentation

## 2023-12-07 DIAGNOSIS — R945 Abnormal results of liver function studies: Secondary | ICD-10-CM | POA: Insufficient documentation

## 2023-12-07 DIAGNOSIS — C50919 Malignant neoplasm of unspecified site of unspecified female breast: Secondary | ICD-10-CM

## 2023-12-07 DIAGNOSIS — C50912 Malignant neoplasm of unspecified site of left female breast: Secondary | ICD-10-CM | POA: Insufficient documentation

## 2023-12-07 DIAGNOSIS — Z85048 Personal history of other malignant neoplasm of rectum, rectosigmoid junction, and anus: Secondary | ICD-10-CM | POA: Insufficient documentation

## 2023-12-07 LAB — CMP (CANCER CENTER ONLY)
ALT: 50 U/L — ABNORMAL HIGH (ref 0–44)
AST: 63 U/L — ABNORMAL HIGH (ref 15–41)
Albumin: 4.4 g/dL (ref 3.5–5.0)
Alkaline Phosphatase: 136 U/L — ABNORMAL HIGH (ref 38–126)
Anion gap: 10 (ref 5–15)
BUN: 16 mg/dL (ref 8–23)
CO2: 28 mmol/L (ref 22–32)
Calcium: 9.4 mg/dL (ref 8.9–10.3)
Chloride: 101 mmol/L (ref 98–111)
Creatinine: 0.8 mg/dL (ref 0.44–1.00)
GFR, Estimated: >60 mL/min (ref >60)
Glucose, Bld: 103 mg/dL — ABNORMAL HIGH (ref 70–99)
Potassium: 3.7 mmol/L (ref 3.5–5.1)
Sodium: 139 mmol/L (ref 135–145)
Total Bilirubin: 0.6 mg/dL (ref 0.0–1.2)
Total Protein: 8.3 g/dL — ABNORMAL HIGH (ref 6.5–8.1)

## 2023-12-07 LAB — FERRITIN: Ferritin: 51 ng/mL (ref 11–307)

## 2023-12-07 LAB — CBC WITH DIFFERENTIAL (CANCER CENTER ONLY)
Abs Immature Granulocytes: 0.04 10*3/uL (ref 0.00–0.07)
Basophils Absolute: 0.1 10*3/uL (ref 0.0–0.1)
Basophils Relative: 1 %
Eosinophils Absolute: 0.3 10*3/uL (ref 0.0–0.5)
Eosinophils Relative: 3 %
HCT: 35.6 % — ABNORMAL LOW (ref 36.0–46.0)
Hemoglobin: 11.3 g/dL — ABNORMAL LOW (ref 12.0–15.0)
Immature Granulocytes: 0 %
Lymphocytes Relative: 17 %
Lymphs Abs: 1.6 10*3/uL (ref 0.7–4.0)
MCH: 28.8 pg (ref 26.0–34.0)
MCHC: 31.7 g/dL (ref 30.0–36.0)
MCV: 90.6 fL (ref 80.0–100.0)
Monocytes Absolute: 0.6 10*3/uL (ref 0.1–1.0)
Monocytes Relative: 7 %
Neutro Abs: 6.8 10*3/uL (ref 1.7–7.7)
Neutrophils Relative %: 72 %
Platelet Count: 260 10*3/uL (ref 150–400)
RBC: 3.93 MIL/uL (ref 3.87–5.11)
RDW: 15.7 % — ABNORMAL HIGH (ref 11.5–15.5)
WBC Count: 9.5 10*3/uL (ref 4.0–10.5)
nRBC: 0 % (ref 0.0–0.2)

## 2023-12-07 LAB — VITAMIN B12: Vitamin B-12: 229 pg/mL (ref 180–914)

## 2023-12-07 LAB — GAMMA GT: GGT: 316 U/L — ABNORMAL HIGH (ref 7–50)

## 2023-12-07 LAB — IRON AND TIBC
Iron: 67 ug/dL (ref 28–170)
Saturation Ratios: 14 % (ref 10.4–31.8)
TIBC: 496 ug/dL — ABNORMAL HIGH (ref 250–450)
UIBC: 429 ug/dL

## 2023-12-07 LAB — HEPATITIS PANEL, ACUTE
HCV Ab: NONREACTIVE
Hep A IgM: NONREACTIVE
Hep B C IgM: NONREACTIVE
Hepatitis B Surface Ag: NONREACTIVE

## 2023-12-07 LAB — FOLATE: Folate: 21.9 ng/mL (ref >5.9)

## 2023-12-07 MED ORDER — ANASTROZOLE 1 MG PO TABS: 1.0000 mg | ORAL_TABLET | Freq: Every day | ORAL | 1 refills | Status: DC

## 2023-12-07 NOTE — Progress Notes (Signed)
 Patient has been having on and off pain under her right breast not constant, and it has been going on for a couple of weeks now. She is on 3 liters of O2 now. She doesn't really have any new questsions for the doctor today

## 2023-12-07 NOTE — Assessment & Plan Note (Signed)
 Check iron TIBC ferritin, folate B12, protein electrophoresis

## 2023-12-07 NOTE — Assessment & Plan Note (Addendum)
 2Left breast invasive mammary carcinoma pT1b pN0, s/p lumpectomy with sentinel lymph node biopsy.  Oncotype DX 8, Distant recurrence at 9 years 3%, chemotherapy benefit <1%, no need for adjuvant chemo. [facility] -recommend to omit  adjuvant radiation due to her lung disease.  She tolerates Arimidex  1mg  daily. Refills were sent.   Annual mammogram bilaterally- Oct 2024 results were reviewed. Slightly increase left intramammary lymph node, s/p biopsy - benign.

## 2023-12-07 NOTE — Assessment & Plan Note (Signed)
 Check hepatitis panel, GGT, iron, TIBC ferritin CT abdomen pelvis with contrast for evaluation.

## 2023-12-07 NOTE — Assessment & Plan Note (Signed)
 Obtain CT abdomen pelvis with contrast for further evaluation.

## 2023-12-07 NOTE — Assessment & Plan Note (Signed)
11/25/2022 DEXA osteopenia FRAX major osteoporotic fracture is 9.1% within the next ten years. Recommend calcium and Vitamin D supplementation.

## 2023-12-07 NOTE — Assessment & Plan Note (Signed)
She is 6 years after chemoradiation.  observation  

## 2023-12-07 NOTE — Progress Notes (Signed)
 Hematology/Oncology Progress note [phone] [phone]     CHIEF COMPLAINTS/PURPOSE OF CONSULTATION:  Left breast invasive cancer   ASSESSMENT & PLAN:   Invasive carcinoma of breast (HCC) 2Left breast invasive mammary carcinoma pT1b pN0, s/p lumpectomy with sentinel lymph node biopsy.  Oncotype DX 8, Distant recurrence at 9 years 3%, chemotherapy benefit <1%, no need for adjuvant chemo. [facility] -recommend to omit  adjuvant radiation due to her lung disease.  She tolerates Arimidex  1mg  daily. Refills were sent.   Annual mammogram bilaterally- Oct 2024 results were reviewed. Slightly increase left intramammary lymph node, s/p biopsy - benign.  Anal squamous cell carcinoma (HCC) She is 6 years after chemoradiation.  observation   Osteopenia 11/25/2022 DEXA osteopenia FRAX major osteoporotic fracture is 9.1% within the next ten years. Recommend calcium  and Vitamin D  supplementation.   Abdominal mass Obtain CT abdomen pelvis with contrast for further evaluation.  Abnormal LFTs Check hepatitis panel, GGT, iron, TIBC ferritin CT abdomen pelvis with contrast for evaluation.  Normocytic anemia Check iron TIBC ferritin, folate B12, protein electrophoresis   Orders Placed This Encounter  Procedures   CT ABDOMEN PELVIS W CONTRAST    Standing Status:   Future    Expected Date:   12/14/2023    Expiration Date:   12/06/2024    If indicated for the ordered procedure, I authorize the administration of contrast media per Radiology protocol:   Yes    Does the patient have a contrast media/X-ray dye allergy?:   No    Preferred imaging location?:   [facility]    If indicated for the ordered procedure, I authorize the administration of oral contrast media per Radiology protocol:   Yes   Protein electrophoresis, serum    Standing Status:   Future    Number of Occurrences:   1    Expected Date:   12/07/2023    Expiration Date:   12/06/2024   Hepatitis panel,  acute    Standing Status:   Future    Number of Occurrences:   1    Expected Date:   12/07/2023    Expiration Date:   12/06/2024   Vitamin B12    Standing Status:   Future    Number of Occurrences:   1    Expected Date:   12/07/2023    Expiration Date:   12/06/2024   Folate    Standing Status:   Future    Number of Occurrences:   1    Expected Date:   12/07/2023    Expiration Date:   12/06/2024   Iron and TIBC    Standing Status:   Future    Number of Occurrences:   1    Expected Date:   12/07/2023    Expiration Date:   12/06/2024   Ferritin    Standing Status:   Future    Number of Occurrences:   1    Expected Date:   12/07/2023    Expiration Date:   06/07/2024   Gamma GT    Standing Status:   Future    Number of Occurrences:   1    Expected Date:   12/07/2023    Expiration Date:   12/06/2024   Follow up to be determined. All questions were answered. The patient knows to call the clinic with any problems, questions or concerns.  Timmy Forbes, MD, PhD [facility] Hematology Oncology 12/07/2023        HISTORY OF PRESENTING ILLNESS:  Judy Allen 68 y.o. female presents to establish care for left breast invasive cancer.  I have reviewed her chart and materials related to her cancer extensively and collaborated history with the patient. Summary of oncologic history is as follows: Oncology History  Anal squamous cell carcinoma (HCC)  03/09/2016 Initial Diagnosis   Anal squamous cell carcinoma   -Stage IIIb Squamous Cell Carcinoma of the Anus: Initially cT2 N2 M0 with left inguinal node involvement. Notably was HIV negative. Completed definitive chemoradiation with concurrent 5FU/mitomycin 05/21/16, for curative intent.     Invasive carcinoma of breast (HCC)  05/27/2022 Mammogram   In the left breast, a possible mass warrants further evaluation. In the right breast, no findings suspicious for malignancy. Further evaluation is suggested for a possible mass in the left breast.    06/16/2022 Mammogram   Unilateral left diagnostic mammogram  There is an indeterminate 7 mm mass in the LEFT breast at 8 o'clock 5 cm from the nipple which is favored to correspond to the site of screening mammographic concern. Recommend ultrasound-guided biopsy for definitive characterization with attention on post marker placement mammogram to assess for mammographic/sonographic correlation.   07/05/2022 Initial Diagnosis   Invasive carcinoma of breast (HCC)  -left breast lesion core biopsy Showed invasive mammary carcinoma, no special type.  Grade 1, DCIS not identified.  LVI not identified, ER/PR 90% positive, HER2 equivocal IHC 2+, HER2 FISH negative.    07/09/2022 Cancer Staging   Staging form: Breast, AJCC 8th Edition - Clinical stage from 07/09/2022: Stage IA (cT1b, cN0, cM0, G1, ER+, PR+, HER2-) - Signed by Timmy Forbes, MD on 07/15/2022 Stage prefix: Initial diagnosis Histologic grading system: 3 grade system   07/26/2022 Surgery   Patient underwent left breast lumpectomy and SLNB  invasive mammary carcinoma, no special type.  Grade 1, DCIS+ low grade, all 3 sentinel lymph nodes negative.  All margins negative. pT1b pN0   07/26/2022 Oncotype testing   Oncotype Dx 8, Distant recurrence at 9 years 3%, chemotherapy benefit <1%   07/26/2022 Cancer Staging   Staging form: Breast, AJCC 8th Edition - Pathologic stage from 07/26/2022: Stage IA (pT1b, pN0, cM0, G1, ER+, PR+, HER2-, Oncotype DX score: 8) - Signed by Timmy Forbes, MD on 08/13/2022 Stage prefix: Initial diagnosis Multigene prognostic tests performed: Oncotype DX Recurrence score range: Less than 11 Histologic grading system: 3 grade system    Genetic Testing   Negative genetic testing. No pathogenic variants identified on the [facility] Common Hereditary Cancers+RNA panel. VUS in POLE called c.623C>G identified. The report date is 08/18/2022.  The Common Hereditary Cancers Panel + RNA offered by [facility] includes sequencing  and/or deletion duplication testing of the following 48 genes: APC*, ATM*, AXIN2, BAP1, BARD1, BMPR1A, BRCA1, BRCA2, BRIP1, CDH1, CDK4, CDKN2A (p14ARF), CDKN2A (p16INK4a), CHEK2, CTNNA1, DICER1*, EPCAM*, FH*, GREM1*, HOXB13, KIT, MBD4, MEN1*, MLH1*, MSH2*, MSH3*, MSH6*, MUTYH, NF1*, NTHL1, PALB2, PDGFRA, PMS2*, POLD1*, POLE, PTEN*, RAD51C, RAD51D, SDHA*, SDHB, SDHC*, SDHD, SMAD4, SMARCA4, STK11, TP53, TSC1*, TSC2, VHL.     Menarche 68 years of age Patient has 3 children. Age at first childbirth 28 years old, Previous OCP use less than 5 years Postmenopausal, last menstrual period July 2003-age of 45, Patient has had a hysterectomy. She has a history of brief hormone replacement therapy, 2 weeks, discontinued due to intolerance. No previous breast biopsies.  Family history positive for maternal aunt with breast cancer.  Father with liver cancer. Patient has history of chronic respiratory failure due to pulmonary hypertension.  Patient  is on nasal cannula oxygen .  She follows up with [facility] pulmonology    INTERVAL HISTORY Judy Allen is a 68 y.o. female who has above history reviewed by me today presents for follow up visit for Stage 1 left breast cancer.  She tolerates Arimidex  1mg  dialy. Manageable side effects. Patient reports on and off pain under right breast, intermittently. Patient is on nasal cannula oxygen , 3 L   MEDICAL HISTORY:  Past Medical History:  Diagnosis Date   Anal squamous cell carcinoma (HCC) 03/10/2016   a.) clinical stage IIIB (T2, N2, M0); Tx'd with chemotherapy + XRT   Anemia    Anxiety    Aortic atherosclerosis (HCC)    Arthritis    Breast cancer, left (HCC) 07/10/2022   Complication of anesthesia    a.) PONV   Constipation    COPD (chronic obstructive pulmonary disease) (HCC)    Coronary artery calcification seen on CT scan    Depression    Diastolic dysfunction 06/08/2021   a.) TTE 06/08/2021: EF 55-60%; mild MR; PASP 47 mmHg; G1DD.   Emphysema of  lung (HCC)    GERD (gastroesophageal reflux disease)    Goals of care, counseling/discussion 07/10/2022   H/O allergic rhinitis    Heart murmur    Hemorrhoid    History of fusion of cervical spine    a.) s/p ACDF C4-C7   History of kidney stones    Hyperlipidemia    Hyperparathyroidism (HCC)    Hypertension    Macular degeneration    Methemoglobinemia 09/07/2016   Neck pain, chronic    Neuritis    OSA on CPAP 09/12/2016   Ovarian failure    Oxygen  dependent    a.) 2 L/[place] (day) with increase to 4 L/[place] (night)   Personal history of chemotherapy    Personal history of radiation therapy    Pneumonitis 2017   a.) related to MITOCYCIN administration for squamous cell anal cancer   PONV (postoperative nausea and vomiting)    Proteinuria    Psoriasis    Pulmonary HTN (HCC) 06/22/2005   a.) TTE 06/22/2005: EF 61%; RVSP 31. b.) TTE 12/18/2009: EF 57%; RVSP 19.9. c.) TTE 05/20/2016: EF >55%; PASP 43. d.) TTE 04/07/2017: EF >55%; PASP 26. e.) TTE 03/12/2018: EF >55%; PASP 26. f.) TTE 06/08/2021: EF 55-60%; PASP 47.   Steatohepatitis    a.) grade II (moderate) on 09/2017 Bx   T2DM (type 2 diabetes mellitus) (HCC)    Tachycardia, paroxysmal (HCC)    Urinary incontinence     SURGICAL HISTORY: Past Surgical History:  Procedure Laterality Date   ANTERIOR AND POSTERIOR REPAIR N/A 12/28/2021   Procedure: ANTERIOR (CYSTOCELE) AND POSTERIOR REPAIR (RECTOCELE);  Surgeon: Teresa Fender, MD;  Location: [facility];  Service: Gynecology;  Laterality: N/A;   ANTERIOR CERVICAL DECOMP/DISCECTOMY FUSION N/A 2001   PROCEDURE: ACDF C4-C7   ANTERIOR FUSION LUMBAR SPINE  1990   plate in back, not metal   BREAST BIOPSY Left 07/05/2022   US  Bx, Ribbon Clip, Path Pending   BREAST BIOPSY Left 07/05/2022   US  LT BREAST BX W LOC DEV 1ST LESION IMG BX SPEC US  GUIDE 07/05/2022 [facility]   BREAST BIOPSY Left 07/22/2022   US  LT RADIO FREQUENCY TAG LOC US  GUIDE 07/22/2022 [facility]   BREAST BIOPSY  Left 06/08/2023   u/s bx 2:00 10 cmfn heart clip, path pending   BREAST BIOPSY Left 06/08/2023   US  LT BREAST BX W LOC DEV 1ST LESION IMG BX SPEC US   GUIDE 06/08/2023 [facility]   CARDIAC CATHETERIZATION  2009?    [facility]   CARDIAC CATHETERIZATION  05/2012   RHC: Mild hypertension 37/12 with a mean pressure of 21 mm mercury   CHOLECYSTECTOMY N/A 09/12/2017   Procedure: LAPAROSCOPIC CHOLECYSTECTOMY WITH INTRAOPERATIVE CHOLANGIOGRAM;  Surgeon: Marshall Skeeter, MD;  Location: [facility];  Service: General;  Laterality: N/A;   COLONOSCOPY  2018   LIVER BIOPSY N/A 09/12/2017   Procedure: LIVER BIOPSY;  Surgeon: Marshall Skeeter, MD;  Location: [facility];  Service: General;  Laterality: N/A;   MASS EXCISION Left 02/23/2016   Procedure: EXCISION MASS;  Surgeon: Jerlean Mood, MD;  Location: [facility];  Service: General;  Laterality: Left;   PART MASTECTOMY,RADIO FREQUENCY LOCALIZER,AXILLARY SENTINEL NODE BIOPSY Left 07/26/2022   INVASIVE MAMMARY CARCINOMA, NO SPECIAL TYPE.  - DUCTAL CARCINOMA IN SITU, LOW GRADE margins 6mm closest anterior  Eldred Grego   PORT-A-CATH REMOVAL  2018   PORTACATH PLACEMENT Left 03/12/2016   Procedure: INSERTION PORT-A-CATH;  Surgeon: Jerlean Mood, MD;  Location: [facility];  Service: General;  Laterality: Left;   TONSILLECTOMY Bilateral 1959   TRIGGER FINGER RELEASE Right 09/2018   Dr. Annabell Key    TUBAL LIGATION     s/p   VAGINAL HYSTERECTOMY N/A 12/28/2021   Procedure: HYSTERECTOMY VAGINAL;  Surgeon: Teresa Fender, MD;  Location: [facility];  Service: Gynecology;  Laterality: N/A;    SOCIAL HISTORY: Social History   Socioeconomic History   Marital status: Widowed    Spouse name: Not on file   Number of children: 3   Years of education: Not on file   Highest education level: 11th grade  Occupational History   Not on file  Tobacco Use   Smoking status: Former    Current packs/day: 0.00    Average packs/day: 1 pack/day for  42.4 years (42.4 ttl pk-yrs)    Types: Cigarettes    Start date: 08/10/1963    Quit date: 01/15/2006    Years since quitting: 17.9   Smokeless tobacco: Never  Vaping Use   Vaping status: Never Used  Substance and Sexual Activity   Alcohol use: No   Drug use: No   Sexual activity: Not Currently    Birth control/protection: Surgical  Other Topics Concern   Not on file  Social History Narrative   Husband passed away on 2018/01/02   She was living with Mylinda Asa for few years, but moved in with Candace her oldest daughter this past Summer.    Social Drivers of Corporate investment banker Strain: Low Risk  (10/30/2023)   Overall Financial Resource Strain (CARDIA)    Difficulty of Paying Living Expenses: Not very hard  Food Insecurity: No Food Insecurity (10/30/2023)   Hunger Vital Sign    Worried About Running Out of Food in the Last Year: Never true    Ran Out of Food in the Last Year: Never true  Transportation Needs: No Transportation Needs (10/30/2023)   PRAPARE - Administrator, Civil Service (Medical): No    Lack of Transportation (Non-Medical): No  Physical Activity: Unknown (10/30/2023)   Exercise Vital Sign    Days of Exercise per Week: 0 days    Minutes of Exercise per Session: Not on file  Stress: Stress Concern Present (10/30/2023)   [employer] of Occupational Health - Occupational Stress Questionnaire    Feeling of Stress : Rather much  Social Connections: Socially Isolated (10/30/2023)   Social Connection and Isolation  Panel [NHANES]    Frequency of Communication with Friends and Family: More than three times a week    Frequency of Social Gatherings with Friends and Family: Once a week    Attends Religious Services: Never    Database administrator or Organizations: No    Attends Engineer, structural: Not on file    Marital Status: Widowed  Intimate Partner Violence: Not At Risk (06/09/2023)   Humiliation, Afraid, Rape, and Kick questionnaire     Fear of Current or Ex-Partner: No    Emotionally Abused: No    Physically Abused: No    Sexually Abused: No    FAMILY HISTORY: Family History  Problem Relation Age of Onset   Heart attack Mother    Asthma Mother    Liver cancer Father 1   Cancer Brother        unk type; metastatic   Breast cancer Maternal Aunt        dx 20s-30s   Breast cancer Cousin        dx 12s   Bladder Cancer Neg Hx    Kidney cancer Neg Hx    Colon cancer Neg Hx     ALLERGIES:  is allergic to aczone [dapsone], abilify  [aripiprazole ], atarax  [hydroxyzine ], hydrochlorothiazide , sulfa antibiotics, and sulfasalazine.  MEDICATIONS:  Current Outpatient Medications  Medication Sig Dispense Refill   sertraline (ZOLOFT) 25 MG tablet Take 25 mg by mouth daily.     albuterol  (PROVENTIL  HFA;VENTOLIN  HFA) 108 (90 Base) MCG/ACT inhaler Inhale 2 puffs into the lungs every 6 (six) hours as needed for wheezing or shortness of breath.     anastrozole  (ARIMIDEX ) 1 MG tablet Take 1 tablet (1 mg total) by mouth daily. 90 tablet 1   Cholecalciferol  (VITAMIN D3) 1000 units CAPS Take 1 capsule by mouth daily.     clobetasol  cream (TEMOVATE ) 0.05 % Apply topically.     cyclobenzaprine  (FLEXERIL ) 5 MG tablet Take 1 tablet (5 mg total) by mouth 3 (three) times daily as needed for muscle spasms. 90 tablet 5   desonide  (DESOWEN ) 0.05 % cream Apply twice daily to affected areas on the face and skin folds (groin and under breasts).     gabapentin  (NEURONTIN ) 400 MG capsule Take 1 capsule (400 mg total) by mouth 3 (three) times daily. 90 capsule 5   HYDROcodone -acetaminophen  (NORCO) 10-325 MG tablet Take 1 tablet by mouth every 8 (eight) hours as needed for severe pain (pain score 7-10). Must last 30 days. 90 tablet 0   [START ON 01/03/2024] HYDROcodone -acetaminophen  (NORCO) 10-325 MG tablet Take 1 tablet by mouth every 8 (eight) hours as needed for severe pain (pain score 7-10). Must last 30 days. 90 tablet 0   [START ON 02/02/2024]  HYDROcodone -acetaminophen  (NORCO) 10-325 MG tablet Take 1 tablet by mouth every 8 (eight) hours as needed for severe pain (pain score 7-10). Must last 30 days. 90 tablet 0   metoprolol  succinate (TOPROL -XL) 50 MG 24 hr tablet Take 1 tablet by mouth daily.     Multiple Vitamins-Minerals (EYE VITAMINS & MINERALS PO) Take 1 capsule by mouth in the morning. [employer] Eye Vitamins     NIFEdipine  (PROCARDIA -XL/NIFEDICAL-XL) 30 MG 24 hr tablet Take 30 mg by mouth at bedtime.     omega-3 acid ethyl esters (LOVAZA ) 1 g capsule TAKE 2 CAPSULES BY MOUTH TWO TIMES A DAY 360 capsule 1   omeprazole  (PRILOSEC) 40 MG capsule Take 1 capsule (40 mg total) by mouth daily. 90 capsule 1  OXYGEN  Inhale 2-4 L into the lungs continuous. 2 L qd and 4 L qhs     Potassium Gluconate 80 MG TABS Take 1 tablet by mouth daily.     rosuvastatin  (CRESTOR ) 10 MG tablet Take 1 tablet (10 mg total) by mouth daily. 90 tablet 1   telmisartan  (MICARDIS ) 80 MG tablet Take 80 mg by mouth daily.     torsemide  (DEMADEX ) 5 MG tablet Take 5 mg by mouth every morning.     umeclidinium-vilanterol (ANORO ELLIPTA ) 62.5-25 MCG/ACT AEPB Inhale 1 puff into the lungs in the morning.     venlafaxine  XR (EFFEXOR -XR) 75 MG 24 hr capsule Take 1 capsule (75 mg total) by mouth 3 (three) times daily. (Patient taking differently: Take 75 mg by mouth 4 (four) times daily.) 270 capsule 1   No current facility-administered medications for this visit.    Review of Systems  Constitutional:  Negative for appetite change, chills, fatigue and fever.  HENT:   Negative for hearing loss and voice change.   Eyes:  Negative for eye problems.  Respiratory:  Positive for shortness of breath. Negative for chest tightness and cough.   Cardiovascular:  Negative for chest pain.  Gastrointestinal:  Negative for abdominal distention, abdominal pain and blood in stool.  Endocrine: Negative for hot flashes.  Genitourinary:  Negative for difficulty urinating and frequency.    Musculoskeletal:  Negative for arthralgias.  Skin:  Negative for itching and rash.  Neurological:  Negative for extremity weakness.  Hematological:  Negative for adenopathy.  Psychiatric/Behavioral:  Negative for confusion.    Aaron Aas    PHYSICAL EXAMINATION: ECOG PERFORMANCE STATUS: 1 - Symptomatic but completely ambulatory  Vitals:   12/07/23 1417  BP: 122/71  Pulse: 89  Resp: 18  Temp: 98.3 F (36.8 C)  SpO2: 94%   Filed Weights   12/07/23 1417  Weight: 182 lb (82.6 kg)    Physical Exam Constitutional:      General: She is not in acute distress.    Appearance: She is not diaphoretic.  HENT:     Head: Normocephalic and atraumatic.  Eyes:     General: No scleral icterus. Cardiovascular:     Rate and Rhythm: Normal rate and regular rhythm.     Heart sounds: No murmur heard. Pulmonary:     Effort: Pulmonary effort is normal. No respiratory distress.     Comments: On nasal cannula oxygen .  Abdominal:     General: There is no distension.     Palpations: Abdomen is soft.     Tenderness: There is no abdominal tenderness.     Comments: Palpable epigastric/right upper quadrant mass  Musculoskeletal:        General: Normal range of motion.     Cervical back: Normal range of motion and neck supple.  Skin:    General: Skin is warm and dry.     Findings: No erythema.  Neurological:     Mental Status: She is alert and oriented to person, place, and time.     Cranial Nerves: No cranial nerve deficit.     Motor: No abnormal muscle tone.     Coordination: Coordination normal.  Psychiatric:        Mood and Affect: Affect normal.     Breast exam was performed in seated and lying down position. Patient is status post left breast lumpectomy.  No palpable breast masses bilaterally.  No palpable axillary adenopathy bilaterally  LABORATORY DATA:  I have reviewed the data as listed  Latest Ref Rng & Units 12/07/2023    1:58 PM 06/01/2023    9:33 AM 05/20/2023    9:35 AM   CBC  WBC 4.0 - 10.5 K/uL 9.5  11.2  9.6   Hemoglobin 12.0 - 15.0 g/dL 78.4  69.6  29.5   Hematocrit 36.0 - 46.0 % 35.6  36.0  36.5   Platelets 150 - 400 K/uL 260  310  337       Latest Ref Rng & Units 12/07/2023    1:58 PM 06/01/2023    9:33 AM 05/16/2023    3:41 PM  CMP  Glucose 70 - 99 mg/dL 284  132  97   BUN 8 - 23 mg/dL 16  14  15    Creatinine 0.44 - 1.00 mg/dL 4.40  1.02  7.25   Sodium 135 - 145 mmol/L 139  138  142   Potassium 3.5 - 5.1 mmol/L 3.7  3.6  4.1   Chloride 98 - 111 mmol/L 101  101  100   CO2 22 - 32 mmol/L 28  28  30    Calcium  8.9 - 10.3 mg/dL 9.4  9.7  36.6   Total Protein 6.5 - 8.1 g/dL 8.3  8.5  8.2   Total Bilirubin 0.0 - 1.2 mg/dL 0.6  0.5  0.4   Alkaline Phos 38 - 126 U/L 136  128    AST 15 - 41 U/L 63  52  38   ALT 0 - 44 U/L 50  37  32      RADIOGRAPHIC STUDIES: I have personally reviewed the radiological images as listed and agreed with the findings in the report. No results found.

## 2023-12-08 LAB — PROTEIN ELECTROPHORESIS, SERUM
A/G Ratio: 1 (ref 0.7–1.7)
Albumin ELP: 4 g/dL (ref 2.9–4.4)
Alpha-1-Globulin: 0.3 g/dL (ref 0.0–0.4)
Alpha-2-Globulin: 1.1 g/dL — ABNORMAL HIGH (ref 0.4–1.0)
Beta Globulin: 1.3 g/dL (ref 0.7–1.3)
Gamma Globulin: 1.2 g/dL (ref 0.4–1.8)
Globulin, Total: 3.9 g/dL (ref 2.2–3.9)
Total Protein ELP: 7.9 g/dL (ref 6.0–8.5)

## 2023-12-15 ENCOUNTER — Ambulatory Visit
Admission: RE | Admit: 2023-12-15 | Discharge: 2023-12-15 | Disposition: A | Discharge status: Home / Self Care | Source: Ambulatory Visit | Attending: Oncology | Admitting: Oncology

## 2023-12-15 DIAGNOSIS — K449 Diaphragmatic hernia without obstruction or gangrene: Secondary | ICD-10-CM | POA: Diagnosis not present

## 2023-12-15 DIAGNOSIS — R1900 Intra-abdominal and pelvic swelling, mass and lump, unspecified site: Secondary | ICD-10-CM | POA: Diagnosis not present

## 2023-12-15 DIAGNOSIS — R7989 Other specified abnormal findings of blood chemistry: Secondary | ICD-10-CM | POA: Insufficient documentation

## 2023-12-15 DIAGNOSIS — K7689 Other specified diseases of liver: Secondary | ICD-10-CM | POA: Diagnosis not present

## 2023-12-15 MED ORDER — IOHEXOL 300 MG/ML  SOLN
100.0000 mL | Freq: Once | INTRAMUSCULAR | Status: AC | PRN
  Administered 2023-12-15: 100 mL via INTRAVENOUS

## 2023-12-19 ENCOUNTER — Other Ambulatory Visit: Payer: Self-pay | Admitting: Oncology

## 2023-12-19 DIAGNOSIS — R162 Hepatomegaly with splenomegaly, not elsewhere classified: Secondary | ICD-10-CM

## 2023-12-19 DIAGNOSIS — R7989 Other specified abnormal findings of blood chemistry: Secondary | ICD-10-CM | POA: Diagnosis not present

## 2023-12-20 ENCOUNTER — Ambulatory Visit: Payer: Self-pay

## 2023-12-20 DIAGNOSIS — F332 Major depressive disorder, recurrent severe without psychotic features: Secondary | ICD-10-CM | POA: Diagnosis not present

## 2023-12-20 DIAGNOSIS — G8929 Other chronic pain: Secondary | ICD-10-CM | POA: Diagnosis not present

## 2023-12-20 DIAGNOSIS — R162 Hepatomegaly with splenomegaly, not elsewhere classified: Secondary | ICD-10-CM

## 2023-12-20 DIAGNOSIS — F411 Generalized anxiety disorder: Secondary | ICD-10-CM | POA: Diagnosis not present

## 2023-12-20 NOTE — Progress Notes (Signed)
 CT results faxed to Dr. Dougherty's office @ [phone]

## 2023-12-20 NOTE — Telephone Encounter (Signed)
 Called pt's ph x3 and was sent straight to VM. VM left informing her of MD recommendations.   Will call back again later today.

## 2023-12-20 NOTE — Telephone Encounter (Deleted)
-----   Message from Judy Allen sent at 12/19/2023  9:52 PM EDT ----- CT showed enlarged liver and spleen. I recommend additional testing. I will order labs. Please obtain US  RUQ.  Also please fax her CT result to [facility] gastroenterology Dr. Fayetta Hoose. She needs follow up with GI for hepatomegaly and increased GGT.   B12 is borderline, recommend B12 1000mcg daily.  Follow up 2 weeks after labs, also after US .  Thanks.

## 2023-12-20 NOTE — Telephone Encounter (Signed)
-----   Message from Timmy Forbes sent at 12/19/2023  9:52 PM EDT ----- CT showed enlarged liver and spleen. I recommend additional testing. I will order labs. Please obtain US  RUQ.  Also please fax her CT result to [facility] gastroenterology Dr. Fayetta Hoose. She needs follow up with GI for hepatomegaly and increased GGT.   B12 is borderline, recommend B12 1000mcg daily.  Follow up 2 weeks after labs, also after US .  Thanks.

## 2023-12-20 NOTE — Addendum Note (Signed)
 Addended by: Craven Do on: 12/20/2023 03:06 PM   Modules accepted: Orders

## 2023-12-21 ENCOUNTER — Inpatient Hospital Stay: Attending: Oncology

## 2023-12-21 DIAGNOSIS — Z79899 Other long term (current) drug therapy: Secondary | ICD-10-CM | POA: Diagnosis not present

## 2023-12-21 DIAGNOSIS — M8580 Other specified disorders of bone density and structure, unspecified site: Secondary | ICD-10-CM | POA: Insufficient documentation

## 2023-12-21 DIAGNOSIS — J9610 Chronic respiratory failure, unspecified whether with hypoxia or hypercapnia: Secondary | ICD-10-CM | POA: Insufficient documentation

## 2023-12-21 DIAGNOSIS — C50912 Malignant neoplasm of unspecified site of left female breast: Secondary | ICD-10-CM | POA: Insufficient documentation

## 2023-12-21 DIAGNOSIS — D649 Anemia, unspecified: Secondary | ICD-10-CM | POA: Insufficient documentation

## 2023-12-21 DIAGNOSIS — K449 Diaphragmatic hernia without obstruction or gangrene: Secondary | ICD-10-CM | POA: Diagnosis not present

## 2023-12-21 DIAGNOSIS — I272 Pulmonary hypertension, unspecified: Secondary | ICD-10-CM | POA: Insufficient documentation

## 2023-12-21 DIAGNOSIS — Z170 Estrogen receptor positive status [ER+]: Secondary | ICD-10-CM | POA: Insufficient documentation

## 2023-12-21 DIAGNOSIS — Z79811 Long term (current) use of aromatase inhibitors: Secondary | ICD-10-CM | POA: Insufficient documentation

## 2023-12-21 DIAGNOSIS — R162 Hepatomegaly with splenomegaly, not elsewhere classified: Secondary | ICD-10-CM | POA: Insufficient documentation

## 2023-12-21 DIAGNOSIS — Z85048 Personal history of other malignant neoplasm of rectum, rectosigmoid junction, and anus: Secondary | ICD-10-CM | POA: Insufficient documentation

## 2023-12-21 DIAGNOSIS — K7460 Unspecified cirrhosis of liver: Secondary | ICD-10-CM | POA: Diagnosis not present

## 2023-12-21 DIAGNOSIS — I700 Atherosclerosis of aorta: Secondary | ICD-10-CM | POA: Diagnosis not present

## 2023-12-21 DIAGNOSIS — R7989 Other specified abnormal findings of blood chemistry: Secondary | ICD-10-CM

## 2023-12-21 DIAGNOSIS — Z800 Family history of malignant neoplasm of digestive organs: Secondary | ICD-10-CM | POA: Diagnosis not present

## 2023-12-21 LAB — C-REACTIVE PROTEIN: CRP: 1.6 mg/dL — ABNORMAL HIGH (ref <1.0)

## 2023-12-21 LAB — LACTATE DEHYDROGENASE: LDH: 149 U/L (ref 98–192)

## 2023-12-21 LAB — SEDIMENTATION RATE: Sed Rate: 41 mm/h — ABNORMAL HIGH (ref 0–30)

## 2023-12-21 LAB — PROTIME-INR
INR: 1.1 (ref 0.8–1.2)
Prothrombin Time: 14.6 s (ref 11.4–15.2)

## 2023-12-21 LAB — APTT: aPTT: 36 s (ref 24–36)

## 2023-12-21 LAB — HIV ANTIBODY (ROUTINE TESTING W REFLEX): HIV Screen 4th Generation wRfx: NONREACTIVE

## 2023-12-22 LAB — ANA W/REFLEX: Anti Nuclear Antibody (ANA): NEGATIVE

## 2023-12-22 LAB — EPSTEIN BARR VRS(EBV DNA BY PCR): EBV DNA QN by PCR: NEGATIVE [IU]/mL

## 2023-12-26 LAB — BCR-ABL1 FISH
Cells Analyzed: 200
Cells Counted: 200

## 2023-12-28 LAB — JAK2 V617F RFX CALR/MPL/E12-15

## 2023-12-28 LAB — CALR +MPL + E12-E15  (REFLEX)

## 2023-12-29 ENCOUNTER — Ambulatory Visit
Admission: RE | Admit: 2023-12-29 | Discharge: 2023-12-29 | Disposition: A | Discharge status: Home / Self Care | Source: Ambulatory Visit | Attending: Oncology | Admitting: Oncology

## 2023-12-29 DIAGNOSIS — R160 Hepatomegaly, not elsewhere classified: Secondary | ICD-10-CM | POA: Diagnosis not present

## 2023-12-29 DIAGNOSIS — Z9049 Acquired absence of other specified parts of digestive tract: Secondary | ICD-10-CM | POA: Diagnosis not present

## 2023-12-29 DIAGNOSIS — R162 Hepatomegaly with splenomegaly, not elsewhere classified: Secondary | ICD-10-CM | POA: Insufficient documentation

## 2024-01-06 ENCOUNTER — Inpatient Hospital Stay: Admitting: Oncology

## 2024-01-06 ENCOUNTER — Encounter: Payer: Self-pay | Admitting: Oncology

## 2024-01-06 VITALS — BP 139/79 | HR 82 | Temp 96.0°F | Resp 18 | Wt 181.7 lb

## 2024-01-06 DIAGNOSIS — D649 Anemia, unspecified: Secondary | ICD-10-CM | POA: Diagnosis not present

## 2024-01-06 DIAGNOSIS — C50912 Malignant neoplasm of unspecified site of left female breast: Secondary | ICD-10-CM | POA: Diagnosis not present

## 2024-01-06 DIAGNOSIS — M8580 Other specified disorders of bone density and structure, unspecified site: Secondary | ICD-10-CM | POA: Diagnosis not present

## 2024-01-06 DIAGNOSIS — K7460 Unspecified cirrhosis of liver: Secondary | ICD-10-CM | POA: Diagnosis not present

## 2024-01-06 DIAGNOSIS — C50919 Malignant neoplasm of unspecified site of unspecified female breast: Secondary | ICD-10-CM

## 2024-01-06 NOTE — Assessment & Plan Note (Signed)
Hemoglobin stable.  Monitor. 

## 2024-01-06 NOTE — Progress Notes (Signed)
 Hematology/Oncology Progress note [phone] [phone]     CHIEF COMPLAINTS/PURPOSE OF CONSULTATION:  Left breast invasive cancer   ASSESSMENT & PLAN:   Invasive carcinoma of breast (HCC) 2Left breast invasive mammary carcinoma pT1b pN0, s/p lumpectomy with sentinel lymph node biopsy.  Oncotype DX 8, Distant recurrence at 9 years 3%, chemotherapy benefit <1%, no need for adjuvant chemo. [facility] -recommend to omit  adjuvant radiation due to her lung disease.  She tolerates Arimidex  1mg  daily. Refills were sent.   Annual mammogram bilaterally- Oct 2025 will obtain   Liver cirrhosis [facility]) CT images and ultrasound findings were reviewed and discussed with patient. She has appointment with her [facility] gastroenterology, pending evaluation  Normocytic anemia Hemoglobin stable.  Monitor.  Osteopenia 11/25/2022 DEXA osteopenia FRAX major osteoporotic fracture is 9.1% within the next ten years. Recommend calcium  and Vitamin D  supplementation.    Orders Placed This Encounter  Procedures   MM 3D DIAGNOSTIC MAMMOGRAM BILATERAL BREAST    Standing Status:   Future    Expected Date:   05/23/2024    Expiration Date:   01/05/2025    Reason for Exam (SYMPTOM  OR DIAGNOSIS REQUIRED):   breast cancer    Preferred imaging location?:   [facility]   US  LIMITED ULTRASOUND INCLUDING AXILLA LEFT BREAST     Standing Status:   Future    Expected Date:   05/23/2024    Expiration Date:   01/05/2025    Reason for Exam (SYMPTOM  OR DIAGNOSIS REQUIRED):   breast cancer    Preferred imaging location?:   [facility]   US  LIMITED ULTRASOUND INCLUDING AXILLA RIGHT BREAST    Standing Status:   Future    Expected Date:   05/23/2024    Expiration Date:   01/05/2025    Reason for Exam (SYMPTOM  OR DIAGNOSIS REQUIRED):   breast cancer    Preferred imaging location?:   [facility]   CMP (Cancer Center only)    Standing Status:   Future    Expected Date:   05/30/2024     Expiration Date:   01/05/2025   CBC with Differential (Cancer Center Only)    Standing Status:   Future    Expected Date:   05/30/2024    Expiration Date:   01/05/2025   Follow up in October. All questions were answered. The patient knows to call the clinic with any problems, questions or concerns.  Timmy Forbes, MD, PhD [facility] Hematology Oncology 01/06/2024        HISTORY OF PRESENTING ILLNESS:  Judy Allen 68 y.o. female presents to establish care for left breast invasive cancer.  I have reviewed her chart and materials related to her cancer extensively and collaborated history with the patient. Summary of oncologic history is as follows: Oncology History  Anal squamous cell carcinoma (HCC)  03/09/2016 Initial Diagnosis   Anal squamous cell carcinoma   -Stage IIIb Squamous Cell Carcinoma of the Anus: Initially cT2 N2 M0 with left inguinal node involvement. Notably was HIV negative. Completed definitive chemoradiation with concurrent 5FU/mitomycin 05/21/16, for curative intent.     Invasive carcinoma of breast (HCC)  05/27/2022 Mammogram   In the left breast, a possible mass warrants further evaluation. In the right breast, no findings suspicious for malignancy. Further evaluation is suggested for a possible mass in the left breast.   06/16/2022 Mammogram   Unilateral left diagnostic mammogram  There is an indeterminate 7 mm mass in the LEFT breast at  8 o'clock 5 cm from the nipple which is favored to correspond to the site of screening mammographic concern. Recommend ultrasound-guided biopsy for definitive characterization with attention on post marker placement mammogram to assess for mammographic/sonographic correlation.   07/05/2022 Initial Diagnosis   Invasive carcinoma of breast (HCC)  -left breast lesion core biopsy Showed invasive mammary carcinoma, no special type.  Grade 1, DCIS not identified.  LVI not identified, ER/PR 90% positive, HER2 equivocal IHC 2+, HER2  FISH negative.    07/09/2022 Cancer Staging   Staging form: Breast, AJCC 8th Edition - Clinical stage from 07/09/2022: Stage IA (cT1b, cN0, cM0, G1, ER+, PR+, HER2-) - Signed by Timmy Forbes, MD on 07/15/2022 Stage prefix: Initial diagnosis Histologic grading system: 3 grade system   07/26/2022 Surgery   Patient underwent left breast lumpectomy and SLNB  invasive mammary carcinoma, no special type.  Grade 1, DCIS+ low grade, all 3 sentinel lymph nodes negative.  All margins negative. pT1b pN0   07/26/2022 Oncotype testing   Oncotype Dx 8, Distant recurrence at 9 years 3%, chemotherapy benefit <1%   07/26/2022 Cancer Staging   Staging form: Breast, AJCC 8th Edition - Pathologic stage from 07/26/2022: Stage IA (pT1b, pN0, cM0, G1, ER+, PR+, HER2-, Oncotype DX score: 8) - Signed by Timmy Forbes, MD on 08/13/2022 Stage prefix: Initial diagnosis Multigene prognostic tests performed: Oncotype DX Recurrence score range: Less than 11 Histologic grading system: 3 grade system    Genetic Testing   Negative genetic testing. No pathogenic variants identified on the [facility] Common Hereditary Cancers+RNA panel. VUS in POLE called c.623C>G identified. The report date is 08/18/2022.  The Common Hereditary Cancers Panel + RNA offered by [facility] includes sequencing and/or deletion duplication testing of the following 48 genes: APC*, ATM*, AXIN2, BAP1, BARD1, BMPR1A, BRCA1, BRCA2, BRIP1, CDH1, CDK4, CDKN2A (p14ARF), CDKN2A (p16INK4a), CHEK2, CTNNA1, DICER1*, EPCAM*, FH*, GREM1*, HOXB13, KIT, MBD4, MEN1*, MLH1*, MSH2*, MSH3*, MSH6*, MUTYH, NF1*, NTHL1, PALB2, PDGFRA, PMS2*, POLD1*, POLE, PTEN*, RAD51C, RAD51D, SDHA*, SDHB, SDHC*, SDHD, SMAD4, SMARCA4, STK11, TP53, TSC1*, TSC2, VHL.     Menarche 68 years of age Patient has 3 children. Age at first childbirth 73 years old, Previous OCP use less than 5 years Postmenopausal, last menstrual period July 2003-age of 45, Patient has had a hysterectomy. She has a history  of brief hormone replacement therapy, 2 weeks, discontinued due to intolerance. No previous breast biopsies.  Family history positive for maternal aunt with breast cancer.  Father with liver cancer. Patient has history of chronic respiratory failure due to pulmonary hypertension.  Patient is on nasal cannula oxygen .  She follows up with [facility] pulmonology    INTERVAL HISTORY Judy Allen is a 68 y.o. female who has above history reviewed by me today presents for follow up visit for Stage 1 left breast cancer.  She tolerates Arimidex  1mg  dialy. Manageable side effects. Patient reports on and off pain under right breast, intermittently. Patient is on nasal cannula oxygen , 3 L 12/16/2023 CT abdomen pelvis with contrast showed hepatosplenomegaly. 12/21/2023, right upper quadrant ultrasound showed hepatomegaly with increased echotexture with mildly nodular contour consistent with cirrhosis.  Results has been faxed to [facility] gastroenterology and patient has upcoming appointment.   MEDICAL HISTORY:  Past Medical History:  Diagnosis Date   Anal squamous cell carcinoma (HCC) 03/10/2016   a.) clinical stage IIIB (T2, N2, M0); Tx'd with chemotherapy + XRT   Anemia    Anxiety    Aortic atherosclerosis (HCC)    Arthritis  Breast cancer, left (HCC) 07/10/2022   Complication of anesthesia    a.) PONV   Constipation    COPD (chronic obstructive pulmonary disease) (HCC)    Coronary artery calcification seen on CT scan    Depression    Diastolic dysfunction 06/08/2021   a.) TTE 06/08/2021: EF 55-60%; mild MR; PASP 47 mmHg; G1DD.   Emphysema of lung (HCC)    GERD (gastroesophageal reflux disease)    Goals of care, counseling/discussion 07/10/2022   H/O allergic rhinitis    Heart murmur    Hemorrhoid    History of fusion of cervical spine    a.) s/p ACDF C4-C7   History of kidney stones    Hyperlipidemia    Hyperparathyroidism (HCC)    Hypertension    Macular degeneration    Methemoglobinemia  09/07/2016   Neck pain, chronic    Neuritis    OSA on CPAP 09/12/2016   Ovarian failure    Oxygen  dependent    a.) 2 L/[place] (day) with increase to 4 L/[place] (night)   Personal history of chemotherapy    Personal history of radiation therapy    Pneumonitis 2017   a.) related to MITOCYCIN administration for squamous cell anal cancer   PONV (postoperative nausea and vomiting)    Proteinuria    Psoriasis    Pulmonary HTN (HCC) 06/22/2005   a.) TTE 06/22/2005: EF 61%; RVSP 31. b.) TTE 12/18/2009: EF 57%; RVSP 19.9. c.) TTE 05/20/2016: EF >55%; PASP 43. d.) TTE 04/07/2017: EF >55%; PASP 26. e.) TTE 03/12/2018: EF >55%; PASP 26. f.) TTE 06/08/2021: EF 55-60%; PASP 47.   Steatohepatitis    a.) grade II (moderate) on 09/2017 Bx   T2DM (type 2 diabetes mellitus) (HCC)    Tachycardia, paroxysmal (HCC)    Urinary incontinence     SURGICAL HISTORY: Past Surgical History:  Procedure Laterality Date   ANTERIOR AND POSTERIOR REPAIR N/A 12/28/2021   Procedure: ANTERIOR (CYSTOCELE) AND POSTERIOR REPAIR (RECTOCELE);  Surgeon: Teresa Fender, MD;  Location: [facility];  Service: Gynecology;  Laterality: N/A;   ANTERIOR CERVICAL DECOMP/DISCECTOMY FUSION N/A 2001   PROCEDURE: ACDF C4-C7   ANTERIOR FUSION LUMBAR SPINE  1990   plate in back, not metal   BREAST BIOPSY Left 07/05/2022   US  Bx, Ribbon Clip, Path Pending   BREAST BIOPSY Left 07/05/2022   US  LT BREAST BX W LOC DEV 1ST LESION IMG BX SPEC US  GUIDE 07/05/2022 [facility]   BREAST BIOPSY Left 07/22/2022   US  LT RADIO FREQUENCY TAG LOC US  GUIDE 07/22/2022 [facility]   BREAST BIOPSY Left 06/08/2023   u/s bx 2:00 10 cmfn heart clip, path pending   BREAST BIOPSY Left 06/08/2023   US  LT BREAST BX W LOC DEV 1ST LESION IMG BX SPEC US  GUIDE 06/08/2023 [facility]   CARDIAC CATHETERIZATION  2009?    [facility]   CARDIAC CATHETERIZATION  05/2012   RHC: Mild hypertension 37/12 with a mean pressure of 21 mm mercury   CHOLECYSTECTOMY N/A  09/12/2017   Procedure: LAPAROSCOPIC CHOLECYSTECTOMY WITH INTRAOPERATIVE CHOLANGIOGRAM;  Surgeon: Marshall Skeeter, MD;  Location: [facility];  Service: General;  Laterality: N/A;   COLONOSCOPY  2018   LIVER BIOPSY N/A 09/12/2017   Procedure: LIVER BIOPSY;  Surgeon: Marshall Skeeter, MD;  Location: [facility];  Service: General;  Laterality: N/A;   MASS EXCISION Left 02/23/2016   Procedure: EXCISION MASS;  Surgeon: Jerlean Mood, MD;  Location: [facility];  Service: General;  Laterality: Left;   PART  MASTECTOMY,RADIO FREQUENCY LOCALIZER,AXILLARY SENTINEL NODE BIOPSY Left 07/26/2022   INVASIVE MAMMARY CARCINOMA, NO SPECIAL TYPE.  - DUCTAL CARCINOMA IN SITU, LOW GRADE margins 6mm closest anterior  Eldred Grego   PORT-A-CATH REMOVAL  2018   PORTACATH PLACEMENT Left 03/12/2016   Procedure: INSERTION PORT-A-CATH;  Surgeon: Jerlean Mood, MD;  Location: [facility];  Service: General;  Laterality: Left;   TONSILLECTOMY Bilateral 1959   TRIGGER FINGER RELEASE Right 09/2018   Dr. Annabell Key    TUBAL LIGATION     s/p   VAGINAL HYSTERECTOMY N/A 12/28/2021   Procedure: HYSTERECTOMY VAGINAL;  Surgeon: Teresa Fender, MD;  Location: [facility];  Service: Gynecology;  Laterality: N/A;    SOCIAL HISTORY: Social History   Socioeconomic History   Marital status: Widowed    Spouse name: Not on file   Number of children: 3   Years of education: Not on file   Highest education level: 11th grade  Occupational History   Not on file  Tobacco Use   Smoking status: Former    Current packs/day: 0.00    Average packs/day: 1 pack/day for 42.4 years (42.4 ttl pk-yrs)    Types: Cigarettes    Start date: 08/10/1963    Quit date: 01/15/2006    Years since quitting: 17.9   Smokeless tobacco: Never  Vaping Use   Vaping status: Never Used  Substance and Sexual Activity   Alcohol use: No   Drug use: No   Sexual activity: Not Currently    Birth control/protection: Surgical  Other Topics Concern    Not on file  Social History Narrative   Husband passed away on 2018-01-02   She was living with Mylinda Asa for few years, but moved in with Candace her oldest daughter this past Summer.    Social Drivers of Corporate investment banker Strain: Low Risk  (10/30/2023)   Overall Financial Resource Strain (CARDIA)    Difficulty of Paying Living Expenses: Not very hard  Food Insecurity: No Food Insecurity (10/30/2023)   Hunger Vital Sign    Worried About Running Out of Food in the Last Year: Never true    Ran Out of Food in the Last Year: Never true  Transportation Needs: No Transportation Needs (10/30/2023)   PRAPARE - Administrator, Civil Service (Medical): No    Lack of Transportation (Non-Medical): No  Physical Activity: Unknown (10/30/2023)   Exercise Vital Sign    Days of Exercise per Week: 0 days    Minutes of Exercise per Session: Not on file  Stress: Stress Concern Present (10/30/2023)   [employer] of Occupational Health - Occupational Stress Questionnaire    Feeling of Stress : Rather much  Social Connections: Socially Isolated (10/30/2023)   Social Connection and Isolation Panel [NHANES]    Frequency of Communication with Friends and Family: More than three times a week    Frequency of Social Gatherings with Friends and Family: Once a week    Attends Religious Services: Never    Database administrator or Organizations: No    Attends Engineer, structural: Not on file    Marital Status: Widowed  Intimate Partner Violence: Not At Risk (06/09/2023)   Humiliation, Afraid, Rape, and Kick questionnaire    Fear of Current or Ex-Partner: No    Emotionally Abused: No    Physically Abused: No    Sexually Abused: No    FAMILY HISTORY: Family History  Problem Relation Age of Onset  Heart attack Mother    Asthma Mother    Liver cancer Father 34   Cancer Brother        unk type; metastatic   Breast cancer Maternal Aunt        dx 20s-30s   Breast cancer  Cousin        dx 27s   Bladder Cancer Neg Hx    Kidney cancer Neg Hx    Colon cancer Neg Hx     ALLERGIES:  is allergic to aczone [dapsone], abilify  [aripiprazole ], atarax  [hydroxyzine ], hydrochlorothiazide , sulfa antibiotics, and sulfasalazine.  MEDICATIONS:  Current Outpatient Medications  Medication Sig Dispense Refill   albuterol  (PROVENTIL  HFA;VENTOLIN  HFA) 108 (90 Base) MCG/ACT inhaler Inhale 2 puffs into the lungs every 6 (six) hours as needed for wheezing or shortness of breath.     anastrozole  (ARIMIDEX ) 1 MG tablet Take 1 tablet (1 mg total) by mouth daily. 90 tablet 1   Cholecalciferol  (VITAMIN D3) 1000 units CAPS Take 1 capsule by mouth daily.     clobetasol  cream (TEMOVATE ) 0.05 % Apply topically.     cyclobenzaprine  (FLEXERIL ) 5 MG tablet Take 1 tablet (5 mg total) by mouth 3 (three) times daily as needed for muscle spasms. 90 tablet 5   desonide  (DESOWEN ) 0.05 % cream Apply twice daily to affected areas on the face and skin folds (groin and under breasts).     gabapentin  (NEURONTIN ) 400 MG capsule Take 1 capsule (400 mg total) by mouth 3 (three) times daily. 90 capsule 5   HYDROcodone -acetaminophen  (NORCO) 10-325 MG tablet Take 1 tablet by mouth every 8 (eight) hours as needed for severe pain (pain score 7-10). Must last 30 days. 90 tablet 0   [START ON 02/02/2024] HYDROcodone -acetaminophen  (NORCO) 10-325 MG tablet Take 1 tablet by mouth every 8 (eight) hours as needed for severe pain (pain score 7-10). Must last 30 days. 90 tablet 0   metoprolol  succinate (TOPROL -XL) 50 MG 24 hr tablet Take 1 tablet by mouth daily.     Multiple Vitamins-Minerals (EYE VITAMINS & MINERALS PO) Take 1 capsule by mouth in the morning. [employer] Eye Vitamins     NIFEdipine  (PROCARDIA -XL/NIFEDICAL-XL) 30 MG 24 hr tablet Take 30 mg by mouth at bedtime.     omega-3 acid ethyl esters (LOVAZA ) 1 g capsule TAKE 2 CAPSULES BY MOUTH TWO TIMES A DAY 360 capsule 1   omeprazole  (PRILOSEC) 40 MG capsule Take 1  capsule (40 mg total) by mouth daily. 90 capsule 1   OXYGEN  Inhale 2-4 L into the lungs continuous. 2 L qd and 4 L qhs     Potassium Gluconate 80 MG TABS Take 1 tablet by mouth daily.     rosuvastatin  (CRESTOR ) 10 MG tablet Take 1 tablet (10 mg total) by mouth daily. 90 tablet 1   sertraline (ZOLOFT) 25 MG tablet Take 25 mg by mouth daily.     telmisartan  (MICARDIS ) 80 MG tablet Take 80 mg by mouth daily.     torsemide  (DEMADEX ) 5 MG tablet Take 5 mg by mouth every morning.     umeclidinium-vilanterol (ANORO ELLIPTA ) 62.5-25 MCG/ACT AEPB Inhale 1 puff into the lungs in the morning.     venlafaxine  XR (EFFEXOR -XR) 75 MG 24 hr capsule Take 1 capsule (75 mg total) by mouth 3 (three) times daily. (Patient taking differently: Take 75 mg by mouth 4 (four) times daily.) 270 capsule 1   No current facility-administered medications for this visit.    Review of Systems  Constitutional:  Negative  for appetite change, chills, fatigue and fever.  HENT:   Negative for hearing loss and voice change.   Eyes:  Negative for eye problems.  Respiratory:  Positive for shortness of breath. Negative for chest tightness and cough.   Cardiovascular:  Negative for chest pain.  Gastrointestinal:  Negative for abdominal distention, abdominal pain and blood in stool.  Endocrine: Negative for hot flashes.  Genitourinary:  Negative for difficulty urinating and frequency.   Musculoskeletal:  Negative for arthralgias.  Skin:  Negative for itching and rash.  Neurological:  Negative for extremity weakness.  Hematological:  Negative for adenopathy.  Psychiatric/Behavioral:  Negative for confusion.    Aaron Aas    PHYSICAL EXAMINATION: ECOG PERFORMANCE STATUS: 1 - Symptomatic but completely ambulatory  Vitals:   01/06/24 1223  BP: 139/79  Pulse: 82  Resp: 18  Temp: (!) 96 F (35.6 C)  SpO2: 92%   Filed Weights   01/06/24 1223  Weight: 181 lb 11.2 oz (82.4 kg)    Physical Exam Constitutional:      General: She  is not in acute distress.    Appearance: She is not diaphoretic.  HENT:     Head: Normocephalic and atraumatic.  Eyes:     General: No scleral icterus. Cardiovascular:     Rate and Rhythm: Normal rate and regular rhythm.     Heart sounds: No murmur heard. Pulmonary:     Effort: Pulmonary effort is normal. No respiratory distress.     Comments: On nasal cannula oxygen .  Abdominal:     General: There is no distension.     Palpations: Abdomen is soft.     Tenderness: There is no abdominal tenderness.     Comments: Palpable hepatomegaly  Musculoskeletal:        General: Normal range of motion.     Cervical back: Normal range of motion and neck supple.  Skin:    General: Skin is warm and dry.     Findings: No erythema.  Neurological:     Mental Status: She is alert and oriented to person, place, and time.     Cranial Nerves: No cranial nerve deficit.     Motor: No abnormal muscle tone.     Coordination: Coordination normal.  Psychiatric:        Mood and Affect: Affect normal.      LABORATORY DATA:  I have reviewed the data as listed    Latest Ref Rng & Units 12/07/2023    1:58 PM 06/01/2023    9:33 AM 05/20/2023    9:35 AM  CBC  WBC 4.0 - 10.5 K/uL 9.5  11.2  9.6   Hemoglobin 12.0 - 15.0 g/dL 91.4  78.2  95.6   Hematocrit 36.0 - 46.0 % 35.6  36.0  36.5   Platelets 150 - 400 K/uL 260  310  337       Latest Ref Rng & Units 12/07/2023    1:58 PM 06/01/2023    9:33 AM 05/16/2023    3:41 PM  CMP  Glucose 70 - 99 mg/dL 213  086  97   BUN 8 - 23 mg/dL 16  14  15    Creatinine 0.44 - 1.00 mg/dL 5.78  4.69  6.29   Sodium 135 - 145 mmol/L 139  138  142   Potassium 3.5 - 5.1 mmol/L 3.7  3.6  4.1   Chloride 98 - 111 mmol/L 101  101  100   CO2 22 - 32 mmol/L 28  28  30   Calcium  8.9 - 10.3 mg/dL 9.4  9.7  64.4   Total Protein 6.5 - 8.1 g/dL 8.3  8.5  8.2   Total Bilirubin 0.0 - 1.2 mg/dL 0.6  0.5  0.4   Alkaline Phos 38 - 126 U/L 136  128    AST 15 - 41 U/L 63  52  38   ALT  0 - 44 U/L 50  37  32      RADIOGRAPHIC STUDIES: I have personally reviewed the radiological images as listed and agreed with the findings in the report. US  Abdomen Limited RUQ (LIVER/GB) Result Date: 12/29/2023 CLINICAL DATA:  Splenomegaly, hepatomegaly. EXAM: ULTRASOUND ABDOMEN LIMITED RIGHT UPPER QUADRANT COMPARISON:  CT abdomen and pelvis Dec 15, 2023 FINDINGS: Gallbladder: Surgically absent. Common bile duct: Diameter: 4.2 mm. Liver: No focal lesion. Increased echotexture with mild nodular contour. Right lobe liver measures at least 24 cm. Portal vein is patent on color Doppler imaging with normal direction of blood flow towards the liver. Other: None. IMPRESSION: Hepatomegaly with increased echotexture and mild nodular contour, consistent with cirrhosis. No focal liver lesion identified. Electronically Signed   By: Anna Barnes M.D.   On: 12/29/2023 12:04   CT ABDOMEN PELVIS W CONTRAST Result Date: 12/16/2023 CLINICAL DATA:  Abnormal LFTs.  Breast cancer. EXAM: CT ABDOMEN AND PELVIS WITH CONTRAST TECHNIQUE: Multidetector CT imaging of the abdomen and pelvis was performed using the standard protocol following bolus administration of intravenous contrast. RADIATION DOSE REDUCTION: This exam was performed according to the departmental dose-optimization program which includes automated exposure control, adjustment of the mA and/or kV according to patient size and/or use of iterative reconstruction technique. CONTRAST:  OMNIPAQUE  IOHEXOL  300 MG/ML  SOLN COMPARISON:  11/24/2021. FINDINGS: Lower chest: Bibasilar linear opacities are stable consistent with scarring. No pericardial or pleural effusion. Small hiatal hernia. Hepatobiliary: Hepatomegaly. No biliary ductal dilatation. Subcentimeter cyst in the right lobe. Status post cholecystectomy. Pancreas: Unremarkable. No pancreatic ductal dilatation or surrounding inflammatory changes. Spleen: 15 cm splenomegaly. Adrenals/Urinary Tract: No adrenal  lesions. Subcentimeter cysts left kidney. No nephrolithiasis or hydronephrosis. Unremarkable urinary bladder. Stomach/Bowel: Stomach is within normal limits. Appendix appears normal. No evidence of bowel wall thickening, distention, or inflammatory changes. Vascular/Lymphatic: Aortic atherosclerosis. No enlarged abdominal or pelvic lymph nodes. Reproductive: Status post hysterectomy. No adnexal masses. Other: No abdominal wall hernia or abnormality. No abdominopelvic ascites. Musculoskeletal: Degenerative disc disease. IMPRESSION: 1. Hepatosplenomegaly. 2. Small hiatal hernia. 3. Left renal and hepatic cysts. 4. No acute abdominal or pelvic pathology identified. 5. Aortic atherosclerosis (ICD10-I70.0). Electronically Signed   By: Sydell Eva M.D.   On: 12/16/2023 09:15

## 2024-01-06 NOTE — Assessment & Plan Note (Signed)
11/25/2022 DEXA osteopenia FRAX major osteoporotic fracture is 9.1% within the next ten years. Recommend calcium and Vitamin D supplementation.

## 2024-01-06 NOTE — Assessment & Plan Note (Signed)
 2Left breast invasive mammary carcinoma pT1b pN0, s/p lumpectomy with sentinel lymph node biopsy.  Oncotype DX 8, Distant recurrence at 9 years 3%, chemotherapy benefit <1%, no need for adjuvant chemo. [facility] -recommend to omit  adjuvant radiation due to her lung disease.  She tolerates Arimidex  1mg  daily. Refills were sent.   Annual mammogram bilaterally- Oct 2025 will obtain

## 2024-01-06 NOTE — Assessment & Plan Note (Signed)
 CT images and ultrasound findings were reviewed and discussed with patient. She has appointment with her [facility] gastroenterology, pending evaluation

## 2024-01-17 DIAGNOSIS — F332 Major depressive disorder, recurrent severe without psychotic features: Secondary | ICD-10-CM | POA: Diagnosis not present

## 2024-01-17 DIAGNOSIS — G894 Chronic pain syndrome: Secondary | ICD-10-CM | POA: Diagnosis not present

## 2024-01-17 DIAGNOSIS — F411 Generalized anxiety disorder: Secondary | ICD-10-CM | POA: Diagnosis not present

## 2024-01-18 ENCOUNTER — Ambulatory Visit: Admitting: Oncology

## 2024-02-01 DIAGNOSIS — F411 Generalized anxiety disorder: Secondary | ICD-10-CM | POA: Diagnosis not present

## 2024-02-01 DIAGNOSIS — F332 Major depressive disorder, recurrent severe without psychotic features: Secondary | ICD-10-CM | POA: Diagnosis not present

## 2024-02-01 DIAGNOSIS — G894 Chronic pain syndrome: Secondary | ICD-10-CM | POA: Diagnosis not present

## 2024-02-04 ENCOUNTER — Other Ambulatory Visit: Payer: Self-pay | Admitting: Oncology

## 2024-02-08 DIAGNOSIS — K922 Gastrointestinal hemorrhage, unspecified: Secondary | ICD-10-CM | POA: Diagnosis not present

## 2024-02-08 DIAGNOSIS — Z79899 Other long term (current) drug therapy: Secondary | ICD-10-CM | POA: Diagnosis not present

## 2024-02-08 DIAGNOSIS — Z90710 Acquired absence of both cervix and uterus: Secondary | ICD-10-CM | POA: Diagnosis not present

## 2024-02-08 DIAGNOSIS — F32A Depression, unspecified: Secondary | ICD-10-CM | POA: Diagnosis not present

## 2024-02-08 DIAGNOSIS — I10 Essential (primary) hypertension: Secondary | ICD-10-CM | POA: Diagnosis not present

## 2024-02-08 DIAGNOSIS — Z882 Allergy status to sulfonamides status: Secondary | ICD-10-CM | POA: Diagnosis not present

## 2024-02-08 DIAGNOSIS — E785 Hyperlipidemia, unspecified: Secondary | ICD-10-CM | POA: Diagnosis not present

## 2024-02-08 DIAGNOSIS — K7460 Unspecified cirrhosis of liver: Secondary | ICD-10-CM | POA: Diagnosis not present

## 2024-02-08 DIAGNOSIS — K319 Disease of stomach and duodenum, unspecified: Secondary | ICD-10-CM | POA: Diagnosis not present

## 2024-02-08 DIAGNOSIS — F419 Anxiety disorder, unspecified: Secondary | ICD-10-CM | POA: Diagnosis not present

## 2024-02-08 DIAGNOSIS — Z9049 Acquired absence of other specified parts of digestive tract: Secondary | ICD-10-CM | POA: Diagnosis not present

## 2024-02-08 DIAGNOSIS — K3189 Other diseases of stomach and duodenum: Secondary | ICD-10-CM | POA: Diagnosis not present

## 2024-02-08 DIAGNOSIS — Z888 Allergy status to other drugs, medicaments and biological substances status: Secondary | ICD-10-CM | POA: Diagnosis not present

## 2024-02-08 DIAGNOSIS — Z87891 Personal history of nicotine dependence: Secondary | ICD-10-CM | POA: Diagnosis not present

## 2024-02-14 DIAGNOSIS — F411 Generalized anxiety disorder: Secondary | ICD-10-CM | POA: Diagnosis not present

## 2024-02-14 DIAGNOSIS — F4312 Post-traumatic stress disorder, chronic: Secondary | ICD-10-CM | POA: Diagnosis not present

## 2024-02-14 DIAGNOSIS — G894 Chronic pain syndrome: Secondary | ICD-10-CM | POA: Diagnosis not present

## 2024-02-14 DIAGNOSIS — F331 Major depressive disorder, recurrent, moderate: Secondary | ICD-10-CM | POA: Diagnosis not present

## 2024-02-16 ENCOUNTER — Encounter: Admitting: Nurse Practitioner

## 2024-02-16 NOTE — Progress Notes (Signed)
 [facility] Gastroenterology Established Visit     Chief concern/reason for visit: Cirrhosis of liver without ascites, unspecified hepatic cirrhosis type   [K74.60] Assessment/Plan: Judy Allen is a 68 y.o. female with history of breast cancer s/p lumpectomy, anal SCC s/p chemoradiation 2017, COPD,  s/p hysterectomy and cystocele/rectocele repair (12/28/21), OSA/COPD, mild pulmonary hypertension, chronic pain syndrome/cervical radiculopathy, anxiety/MDD, cholecystectomy (2019), HTN, hypertriglyceridemia, T2DM with neuropathy, recently diagnosed MASLD cirrhosis who originally presented with constipation, fecal incontinence, dysphagia.  Now presenting for follow-up.  Assessment & Plan Cirrhosis- MASH, compensated, no complications Cirrhosis likely secondary to [facility] given multiple metabolic risk factors including hyperlipidemia, HTN, obesity and history of diabetes. Also with OSA/COPD, mild portal hypertension with possible congestive hepatopathy. No evidence of decompensation such as ascites, encephalopathy, bleeding, or jaundice. Negative for viral hepatitis. Anti-mitochondrial antibody was equivocal, and ANA was positive in the past.  She most likely has MASH cirrhosis, less likely PBC given only recently elevated ALP (which can also be elevated of MASH).  She has a history of elevated AST ALT going back to 2017 without elevation of ALP which would not be consistent with PBC.  No indication for liver biopsy at this time given liver tests are within goal range (if she were to have AIH or PBC), but we will continue to monitor over time.  Ordered labs to round out this cirrhosis workup including hemochromatosis, autoimmune hepatitis, alpha-1 antitrypsin.  We discussed the importance of controlling metabolic risk factors and avoiding all alcohol to prevent progression to decompensated cirrhosis emphasized.  Encephalopathy: No history of encephalopathy Varices: 02/08/24 No varices. Repeat screen in 2  years. Ascites: None, has lower extremity edema- managed with torsemide  HCC screening: 12/29/23 US  abdomen. Repeat due in 6 months 06/2024 Transplant: Low MELD, comorbidities (on chronic O2) Vaccines: Needs hep A and B, first dose 02/17/2024. Has had pneumococcal, Shingrix, COVID, flu.   Overflow Diarrhea  Fecal incontinence Episodes of diarrhea possibly related to overflow due to chronic opioid use. Has incontinence with loose stools (overflow diarrhea). She has poor sphincter tone after radiation for her anal SCC (has almost no squeeze on DRE). Plan to manage with pelvic floor therapy. Also has occasional episodes sometimes accompanied by fever, but without recent antibiotic use. Her daughter has a history of C. difficile and they were concerned and will therefore order those tests to be done when she has these episodes.   Plan: Further labs: PI typing alpha-1 antitrypsin deficiency (given history of COPD), iron panel, total IgM, IgG, and AIH labs Discussed the importance of avoiding NSAIDs to avoid renal failure Ok to take acetaminophen  < 2 g a day; recommended changing Norco to hydrocodone  to ensure she does not get excess acetaminophen   C. Difficile, GIPP ordered; can submit when she has episodes of diarrhea Stop imodium Previously referred to pelvic floor PT Referral to Nutritionist to discuss healthy eating, low sodium diet Ordered US  abdomen for November Future considerations: Reinforce fiber, treat constipation  Return in about 3 months (around 05/19/2024). Seen and discussed with Dr. Cleaster, [facility]* Discussed with Dr. Charlett Tawni GORMAN Veva, MD Gastroenterology and Hepatology Fellow  Subjective Last clinic visit: 11/25/2023. In the interim, patient had a CTAP at [facility] for elevated LFTs which showed evidence of hepatosplenomegaly.  Ultrasound right upper quadrant showed hepatomegaly with mild nodular contour, consistent with cirrhosis.  FibroScan done June  11 showed evidence of F4 cirrhosis.  EGD on 7/2 screening for varices showed no evidence.  History of Present Illness Judy  Allen is a 68 year old female with cirrhosis who presents for follow-up and management of her condition.  She is accompanied by her daughter who she lives with.  She has a new diagnosis of cirrhosis, confirmed by a CT scan showing hepatosplenomegaly and a subsequent FibroScan. Initial evaluation was prompted by alternating constipation and diarrhea, along with mildly elevated liver function tests. An EGD for variceal screening was negative for varices. Her viral hepatitis panel was negative. She has risk factors including a history of smoking, COPD, hypertension, hyperlipidemia, and past diabetes. No decompensations such as ascites, encephalopathy, bleeding, or jaundice have occurred.  She does not consume alcohol and has not done so for many years.  Has a history of smoking 1 pack/day from ages [age] and quit smoking in 2008.  Her father had liver cancer, with a history of drinking and smoking.  Per daughter she has a poor diet.  However has had unintentional weight loss of around 10 pounds in the past 6 months.  She is on 2 L O2 at home chronically has difficulties going up stairs, but does do chores at home.  She experiences episodes of diarrhea, sometimes with fevers up to 102F.  Other times she has regular bowel movements usually once a day, but sometimes she will go few days without having a bowel movement and will have hard stools.  She is chronically on opioids, specifically Norco and has been taking ibuprofen  as needed for pain. No recent antibiotic use. Has not yet seen pelvic floor PT.  Prior work-up: 02/08/24 EGD:- Normal esophagus. No evidence of esophageal varices. - Small amount of hematin (altered blood/coffee-ground-like material) in the gastric body.- Erythematous mucosa in the antrum and prepyloric region of the stomach, possibly GAVE. Biopsied. No HP  01/18/24   Fibroscan: 24 kPa (F4), CAP 255 dB/m (S1 (248-260)) 12/15/23 CTAP [place]- for abnormal LFTs) with evidence of hepatosplenomegaly. S/p cholecystectomy. 04/13/2023 EGD (dysphagia): No evidence for etiology of dysphagia.  Small amount of bilious fluid seen in the fundus.  Otherwise normal 02/08/23 TTE: EF >55%, borderline portal hypertension (PASP 39) 11/24/21 CTAP: normal, Hepatic steatosis 09/29/2016 EGD (dysphagia) normal 03/17/16 Colonoscopy (personal history of anal cancer): Anal mass found on perianal exam.  DRE showed decreased sphincter tone.  2 hyperplastic polyps.  Rectal inflammation status post biopsies with hyperplastic features, no colitis, dysplasia.  Diverticulosis in sigmoid and descending colon.  Normal ileum.   Current medications: Norco (hydrocodone -acetaminophen ) NSAIDs Imodium (takes occasionally)- 2 the first time and 1 afterwards    Past medication trials: Miralax  - tried before in the past but gave her diarrhea  Objective BP 104/64 (BP Site: R Arm, BP Position: Sitting, BP Cuff Size: Large)   Pulse 80   Temp 36.1 C (97 F) (Temporal)   Ht 160 cm (5' 3)   Wt 80.3 kg (177 lb)   LMP  (LMP Unknown) Comment: menapause  BMI 31.35 kg/m  Weight Change Past 6 Months in Pounds (rounded): -10.4 at 02/17/2024  4:55 PM Physical Exam CONSTITUTIONAL: No apparent distress, on 2L [place], in wheelchair just to get up the stairs EYES: No scleral icterus. CARDIOVASCULAR: No peripheral edema. GASTROINTESTINAL: Soft, nontender abdomen without evidence of masses. EXTREMITIES: mild pitting edema of the ankles bilaterally SKIN: No rashes noted. NEUROLOGIC: Nonfocal.  Results MELD 3.0: 8 at 12/26/2023 12:05 PM MELD-Na: 7 at 12/26/2023 12:05 PM Calculated from: Serum Creatinine: 0.71 mg/dL (Using min of 1 mg/dL) at 4/80/7974 87:94 PM Serum Sodium: 142 mmol/L (Using max of 137  mmol/L) at 12/26/2023 12:05 PM Total Bilirubin: 0.5 mg/dL (Using min of 1 mg/dL) at 4/80/7974 87:94 PM Serum  Albumin: 4.2 g/dL (Using max of 3.5 g/dL) at 4/80/7974 87:94 PM INR(ratio): 1.1 at 12/26/2023 12:05 PM Age at listing (hypothetical): 77 years Sex: Female at 12/26/2023 12:05 PM

## 2024-02-17 DIAGNOSIS — K7460 Unspecified cirrhosis of liver: Secondary | ICD-10-CM | POA: Diagnosis not present

## 2024-02-17 DIAGNOSIS — E0843 Diabetes mellitus due to underlying condition with diabetic autonomic (poly)neuropathy: Secondary | ICD-10-CM | POA: Diagnosis not present

## 2024-02-17 DIAGNOSIS — Z5111 Encounter for antineoplastic chemotherapy: Secondary | ICD-10-CM | POA: Diagnosis not present

## 2024-02-17 DIAGNOSIS — R197 Diarrhea, unspecified: Secondary | ICD-10-CM | POA: Diagnosis not present

## 2024-02-28 ENCOUNTER — Encounter: Payer: Self-pay | Admitting: Nurse Practitioner

## 2024-02-28 ENCOUNTER — Ambulatory Visit: Attending: Nurse Practitioner | Admitting: Nurse Practitioner

## 2024-02-28 VITALS — BP 142/79 | HR 78 | Temp 97.4°F | Ht 64.0 in | Wt 177.0 lb

## 2024-02-28 DIAGNOSIS — M5416 Radiculopathy, lumbar region: Secondary | ICD-10-CM | POA: Diagnosis not present

## 2024-02-28 DIAGNOSIS — G8929 Other chronic pain: Secondary | ICD-10-CM | POA: Diagnosis not present

## 2024-02-28 DIAGNOSIS — M5412 Radiculopathy, cervical region: Secondary | ICD-10-CM | POA: Diagnosis not present

## 2024-02-28 DIAGNOSIS — G894 Chronic pain syndrome: Secondary | ICD-10-CM | POA: Insufficient documentation

## 2024-02-28 DIAGNOSIS — M542 Cervicalgia: Secondary | ICD-10-CM | POA: Insufficient documentation

## 2024-02-28 DIAGNOSIS — Z79899 Other long term (current) drug therapy: Secondary | ICD-10-CM | POA: Diagnosis not present

## 2024-02-28 MED ORDER — CYCLOBENZAPRINE HCL 5 MG PO TABS: 5.0000 mg | ORAL_TABLET | Freq: Three times a day (TID) | ORAL | 5 refills | Status: AC | PRN

## 2024-02-28 MED ORDER — HYDROCODONE BITARTRATE ER 10 MG PO CP12: 10.0000 mg | ORAL_CAPSULE | Freq: Two times a day (BID) | ORAL | 0 refills | Status: AC

## 2024-02-28 MED ORDER — HYDROCODONE BITARTRATE ER 10 MG PO CP12: 10.0000 mg | ORAL_CAPSULE | Freq: Two times a day (BID) | ORAL | 0 refills | Status: DC

## 2024-02-28 MED ORDER — GABAPENTIN 400 MG PO CAPS: 400.0000 mg | ORAL_CAPSULE | Freq: Three times a day (TID) | ORAL | 5 refills | Status: AC

## 2024-02-28 NOTE — Progress Notes (Signed)
 PROVIDER NOTE: Interpretation of information contained herein should be left to medically-trained personnel. Specific patient instructions are provided elsewhere under Patient Instructions section of medical record. This document was created in part using AI and STT-dictation technology, any transcriptional errors that may result from this process are unintentional.  Patient: Judy Allen  Service: E/M   PCP: Sowles, Krichna, MD  DOB: Jan 20, 1956  DOS: 02/28/2024  Provider: Emmy MARLA Blanch, NP  MRN: [id number]  Delivery: Face-to-face  Specialty: Interventional Pain Management  Type: Established Patient  Setting: Ambulatory outpatient facility  Specialty designation: 09  Referring Prov.: Sowles, Krichna, MD  Location: Outpatient office facility       History of present illness (HPI) [name], a 68 y.o. year old female, is here today because of her Chronic pain syndrome [G89.4]. [name] primary complain today is Neck Pain  Pertinent problems: Judy Allen has Chronic recurrent major depressive disorder (HCC); GAD (generalized anxiety disorder); Myofascial pain syndrome, cervical; Cervical facet joint syndrome; Cervical radiculopathy (Left); Osteoarthritis of spine with radiculopathy, cervical region, and Chronic pain syndrome on their pertinent problem list.  Pain Assessment: Severity of Chronic pain is reported as a 4 /10. Location: Neck Posterior/pain radiaites her left shoulder and finger tips. Onset: More than a month ago. Quality: Tingling, Aching. Timing: Constant. Modifying factor(s): Meds. Vitals:  height is 5' 4 (1.626 m) and weight is 177 lb (80.3 kg). Her temperature is 97.4 F (36.3 C) (abnormal). Her blood pressure is 142/79 (abnormal) and her pulse is 78. Her oxygen  saturation is 88% (abnormal).  BMI: Estimated body mass index is 30.38 kg/m as calculated from the following:   Height as of this encounter: 5' 4 (1.626 m).   Weight as of this encounter: 177 lb (80.3  kg).  Last encounter: 11/24/2023 Last procedure: Visit date not found.  Reason for encounter: medication management. No change in medical history since last visit.  Patient's pain is at baseline.  Patient continues multimodal pain regimen as prescribed.  States that it provides pain relief and improvement in functional status. She complains of her neck pain that radiates to her bilateral arm. We offered her joint injection however she is not ready for joint injection.  Advise her in the future if she wants to consider joint injection that help to reduce pain and inflammation around her neck area.   The patient was recently diagnosed with liver cirrhosis and is concerned about taking hydrocodone -acetaminophen .  She wishes to discontinue this medication and would prefer hydrocodone  without Tylenol .   Pharmacotherapy Assessment   Hydrocodone  ER 10 Mg every 12 hours as needed for pain.  Gabapentin  (Neurontin ) 400 mg capsule 3 times daily for neuropathic pain Flexeril  5 mg tablet 3 times daily as needed for muscle spasm Monitoring: [place] PMP: PDMP reviewed during this encounter.       Pharmacotherapy: No side-effects or adverse reactions reported. Compliance: No problems identified. Effectiveness: Clinically acceptable.  Judy Dorothe LABOR, RN  02/28/2024 11:24 AM  Sign when Signing Visit Nursing Pain Medication Assessment:  Safety precautions to be maintained throughout the outpatient stay will include: orient to surroundings, keep bed in low position, maintain call bell within reach at all times, provide assistance with transfer out of bed and ambulation.  Medication Inspection Compliance: Pill count conducted under aseptic conditions, in front of the patient. Neither the pills nor the bottle was removed from the patient's sight at any time. Once count was completed pills were immediately returned to the patient in their  original bottle.  Medication: Hydrocodone /APAP Pill/Patch Count: 13 of 90  pills/patches remain Pill/Patch Appearance: Markings consistent with prescribed medication Bottle Appearance: Standard pharmacy container. Clearly labeled. Filled Date: 6 / 49 / 2025 Last Medication intake:  TodaySafety precautions to be maintained throughout the outpatient stay will include: orient to surroundings, keep bed in low position, maintain call bell within reach at all times, provide assistance with transfer out of bed and ambulation.   UDS:  Summary  Date Value Ref Range Status  03/08/2023 Note  Final    Comment:    ==================================================================== [employer] Select 13 (MW) ==================================================================== Test                             Result       Flag       Units  Drug Present and Declared for Prescription Verification   Hydrocodone                     3516         EXPECTED   ng/mg creat   Hydromorphone                   916          EXPECTED   ng/mg creat   Dihydrocodeine                 259          EXPECTED   ng/mg creat   Norhydrocodone                 1391         EXPECTED   ng/mg creat    Sources of hydrocodone  include scheduled prescription medications.    Hydromorphone , dihydrocodeine and norhydrocodone are expected    metabolites of hydrocodone . Hydromorphone  and dihydrocodeine are    also available as scheduled prescription medications.  ==================================================================== Test                      Result    Flag   Units      Ref Range   Creatinine              32               mg/dL      >=79 ==================================================================== Declared Medications:  The flagging and interpretation on this report are based on the  following declared medications.  Unexpected results may arise from  inaccuracies in the declared medications.   **Note: The testing scope of this panel includes these medications:   Hydrocodone  (Norco)    **Note: The testing scope of this panel does not include the  following reported medications:   Acetaminophen  (Norco)  Albuterol   Anastrozole  (Arimidex )  Buspirone  (Buspar )  Cholecalciferol   Cyclobenzaprine  (Flexeril )  Gabapentin  (Neurontin )  Helium  Metoprolol   Multivitamin  Nifedipine   Omega-3 Fatty Acids  Omeprazole   Oxygen   Potassium  Rosuvastatin   Telmisartan   Torsemide   Umeclidinium (Anoro)  Venlafaxine  (Effexor )  Vilanterol (Anoro) ==================================================================== For clinical consultation, please call [phone]. ====================================================================     No results found for: CBDTHCR No results found for: D8THCCBX No results found for: D9THCCBX  ROS  Constitutional: Denies any fever or chills Gastrointestinal: No reported hemesis, hematochezia, vomiting, or acute GI distress Musculoskeletal: Neck pain Neurological: No reported episodes of acute onset apraxia, aphasia, dysarthria, agnosia, amnesia, paralysis, loss of coordination, or loss of consciousness  Medication Review  HYDROcodone  Bitartrate ER, Multiple Vitamins-Minerals, NIFEdipine , Oxygen -Helium, Potassium Gluconate, Vitamin D3, albuterol , anastrozole , clobetasol  cream, cyclobenzaprine , desonide , gabapentin , metoprolol  succinate, omega-3 acid ethyl esters, omeprazole , rosuvastatin , sertraline, telmisartan , torsemide , umeclidinium-vilanterol, and venlafaxine  XR  History Review  Allergy: Ms. Betten is allergic to aczone [dapsone], abilify  [aripiprazole ], atarax  [hydroxyzine ], hydrochlorothiazide , sulfa antibiotics, and sulfasalazine. Drug: Ms. Hollar  reports no history of drug use. Alcohol:  reports no history of alcohol use. Tobacco:  reports that she quit smoking about 18 years ago. Her smoking use included cigarettes. She started smoking about 60 years ago. She has a 42.4 pack-year smoking history. She has never used smokeless  tobacco. Social: Ms. Schnieders  reports that she quit smoking about 18 years ago. Her smoking use included cigarettes. She started smoking about 60 years ago. She has a 42.4 pack-year smoking history. She has never used smokeless tobacco. She reports that she does not drink alcohol and does not use drugs. Medical:  has a past medical history of Anal squamous cell carcinoma (HCC) (03/10/2016), Anemia, Anxiety, Aortic atherosclerosis (HCC), Arthritis, Breast cancer, left (HCC) (07/10/2022), Complication of anesthesia, Constipation, COPD (chronic obstructive pulmonary disease) (HCC), Coronary artery calcification seen on CT scan, Depression, Diastolic dysfunction (06/08/2021), Emphysema of lung (HCC), GERD (gastroesophageal reflux disease), Goals of care, counseling/discussion (07/10/2022), H/O allergic rhinitis, Heart murmur, Hemorrhoid, History of fusion of cervical spine, History of kidney stones, Hyperlipidemia, Hyperparathyroidism (HCC), Hypertension, Macular degeneration, 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 Surgical: Ms. Basulto  has a past surgical history that includes Tubal ligation; Anterior fusion lumbar spine (1990); Mass excision (Left, 02/23/2016); Colonoscopy (2018); Portacath placement (Left, 03/12/2016); Cardiac catheterization (2009? ); Cardiac catheterization (05/2012); Port-a-cath removal (2018); Liver biopsy (N/A, 09/12/2017); Cholecystectomy (N/A, 09/12/2017); Trigger finger release (Right, 09/2018); Anterior cervical decomp/discectomy fusion (N/A, 2001); Tonsillectomy (Bilateral, 1959); Anterior and posterior  repair (N/A, 12/28/2021); Vaginal hysterectomy (N/A, 12/28/2021); Breast biopsy (Left, 07/05/2022); Breast biopsy (Left, 07/05/2022); Breast biopsy (Left, 07/22/2022); Part mastectomy,radio frequency localizer,axillary sentinel node biopsy (Left, 07/26/2022); Breast biopsy (Left, 06/08/2023); and Breast biopsy (Left, 06/08/2023). Family: family history includes Asthma in her mother; Breast cancer in her cousin and maternal aunt; Cancer in her brother; Heart attack in her mother; Liver cancer (age of onset: 36) in her father.  Laboratory Chemistry Profile   Renal Lab Results  Component Value Date   BUN 16 12/07/2023   CREATININE 0.80 12/07/2023   LABCREA 127 05/16/2023   BCR SEE NOTE: 05/16/2023   GFRAA 75 04/23/2019   GFRNONAA >60 12/07/2023    Hepatic Lab Results  Component Value Date   AST 63 (H) 12/07/2023   ALT 50 (H) 12/07/2023   ALBUMIN 4.4 12/07/2023   ALKPHOS 136 (H) 12/07/2023   HCVAB NON REACTIVE 12/07/2023   LIPASE 27 08/03/2017    Electrolytes Lab Results  Component Value Date   NA 139 12/07/2023   K 3.7 12/07/2023   CL 101 12/07/2023   CALCIUM  9.4 12/07/2023    Bone Lab Results  Component Value Date   VD25OH 57 07/22/2021    Inflammation (CRP: Acute Phase) (ESR: Chronic Phase) Lab Results  Component Value Date   CRP 1.6 (H) 12/21/2023   ESRSEDRATE 41 (H) 12/21/2023         Note: Above Lab results reviewed.  Recent  Imaging Review  US  Abdomen Limited RUQ (LIVER/GB) CLINICAL DATA:  Splenomegaly, hepatomegaly.  EXAM: ULTRASOUND ABDOMEN LIMITED RIGHT UPPER QUADRANT  COMPARISON:  CT abdomen and pelvis Dec 15, 2023  FINDINGS: Gallbladder:  Surgically absent.  Common bile duct:  Diameter: 4.2 mm.  Liver:  No focal lesion. Increased echotexture with mild nodular contour. Right lobe liver measures at least 24 cm. Portal vein is patent on color Doppler imaging with normal direction of blood flow towards the liver.  Other:  None.  IMPRESSION: Hepatomegaly with increased echotexture and mild nodular contour, consistent with cirrhosis. No focal liver lesion identified.  Electronically Signed   By: Craig Farr M.D.   On: 12/29/2023 12:04 Note: Reviewed        Physical Exam  Vitals: BP (!) 142/79   Pulse 78   Temp (!) 97.4 F (36.3 C)   Ht 5' 4 (1.626 m)   Wt 177 lb (80.3 kg)   LMP 03/04/2002 (Approximate)   SpO2 (!) 88% Comment: 4  BMI 30.38 kg/m  BMI: Estimated body mass index is 30.38 kg/m as calculated from the following:   Height as of this encounter: 5' 4 (1.626 m).   Weight as of this encounter: 177 lb (80.3 kg). Ideal: Ideal body weight: 54.7 kg (120 lb 9.5 oz) Adjusted ideal body weight: 64.9 kg (143 lb 2.5 oz) General appearance: Well nourished, well developed, and well hydrated. In no apparent acute distress Mental status: Alert, oriented x 3 (person, place, & time)       Respiratory: No evidence of acute respiratory distress Eyes: PERLA   Assessment   Diagnosis Status  1. Chronic pain syndrome   2. Cervical radiculopathy (left)   3. Lumbar radiculopathy   4. Chronic cervical pain   5. Chronic radicular lumbar pain   6. Medication management    Controlled Controlled Controlled   Updated Problems: No problems updated.  Plan of Care  Problem-specific:  Assessment and Plan Discontinue Norco 10-325 mg  Started Hydrocodone  Bitartrate ER 10 Mg capsules every 12 hours as needed for pain.  Scribe and drug monitoring (PDMP) reviewed; findings consistent with the use of prescribed medication and no evidence of narcotic misuse or abuse. Routine UDS ordered today.  No other issues or concerns reported to this visit.  Schedule follow-up in 90 days for medication management.   Ms. ALEANA FIFITA has a current medication list which includes the following long-term medication(s): omega-3 acid ethyl esters, omeprazole , potassium gluconate, rosuvastatin , sertraline, venlafaxine  xr, and  gabapentin .  Pharmacotherapy (Medications Ordered): Meds ordered this encounter  Medications   gabapentin  (NEURONTIN ) 400 MG capsule    Sig: Take 1 capsule (400 mg total) by mouth 3 (three) times daily.    Dispense:  90 capsule    Refill:  5   HYDROcodone  Bitartrate ER 10 MG CP12    Sig: Take 10 mg by mouth every 12 (twelve) hours. Must last 30 days.    Dispense:  60 capsule    Refill:  0    Chronic Pain: STOP Act (Not applicable) Fill 1 day early if closed on refill date. Avoid benzodiazepines within 8 hours of opioids   HYDROcodone  Bitartrate ER 10 MG CP12    Sig: Take 10 mg by mouth every 12 (twelve) hours. Must last 30 days.    Dispense:  60 capsule    Refill:  0    Chronic Pain: STOP Act (Not applicable) Fill 1 day early if closed on refill date. Avoid benzodiazepines within  8 hours of opioids   HYDROcodone  Bitartrate ER 10 MG CP12    Sig: Take 10 mg by mouth every 12 (twelve) hours. Must last 30 days.    Dispense:  60 capsule    Refill:  0    Chronic Pain: STOP Act (Not applicable) Fill 1 day early if closed on refill date. Avoid benzodiazepines within 8 hours of opioids   cyclobenzaprine  (FLEXERIL ) 5 MG tablet    Sig: Take 1 tablet (5 mg total) by mouth 3 (three) times daily as needed for muscle spasms.    Dispense:  90 tablet    Refill:  5    Do not place this medication, or any other prescription from our practice, on Automatic Refill. Patient may have prescription filled one day early if pharmacy is closed on scheduled refill date.   Orders:  Orders Placed This Encounter  Procedures   [employer] Select 13 (MW), Urine    Volume: 30 ml(s). Minimum 3 ml of urine is needed. Document temperature of fresh sample. Indications: Long term (current) use of opiate analgesic (S20.108)    Release to patient:   Immediate        Return in about 3 months (around 05/30/2024) for (F2F), (MM), Emmy Blanch NP.    Recent Visits No visits were found meeting these  conditions. Showing recent visits within past 90 days and meeting all other requirements Today's Visits Date Type Provider Dept  02/28/24 Office Visit Ajay Strubel K, NP [facility]  Showing today's visits and meeting all other requirements Future Appointments Date Type Provider Dept  05/24/24 Appointment Angelle Isais K, NP [facility]  Showing future appointments within next 90 days and meeting all other requirements  I discussed the assessment and treatment plan with the patient. The patient was provided an opportunity to ask questions and all were answered. The patient agreed with the plan and demonstrated an understanding of the instructions.  Patient advised to call back or seek an in-person evaluation if the symptoms or condition worsens.  Duration of encounter: 30 minutes.  Total time on encounter, as per AMA guidelines included both the face-to-face and non-face-to-face time personally spent by the physician and/or other qualified health care professional(s) on the day of the encounter (includes time in activities that require the physician or other qualified health care professional and does not include time in activities normally performed by clinical staff). Physician's time may include the following activities when performed: Preparing to see the patient (e.g., pre-charting review of records, searching for previously ordered imaging, lab work, and nerve conduction tests) Review of prior analgesic pharmacotherapies. Reviewing PMP Interpreting ordered tests (e.g., lab work, imaging, nerve conduction tests) Performing post-procedure evaluations, including interpretation of diagnostic procedures Obtaining and/or reviewing separately obtained history Performing a medically appropriate examination and/or evaluation Counseling and educating the patient/family/caregiver Ordering medications, tests, or procedures Referring and communicating with other health care  professionals (when not separately reported) Documenting clinical information in the electronic or other health record Independently interpreting results (not separately reported) and communicating results to the patient/ family/caregiver Care coordination (not separately reported)  Note by: Chelsa Stout K Tadashi Burkel, NP (TTS and AI technology used. I apologize for any typographical errors that were not detected and corrected.) Date: 02/28/2024; Time: 12:03 PM

## 2024-02-28 NOTE — Progress Notes (Signed)
 Nursing Pain Medication Assessment:  Safety precautions to be maintained throughout the outpatient stay will include: orient to surroundings, keep bed in low position, maintain call bell within reach at all times, provide assistance with transfer out of bed and ambulation.  Medication Inspection Compliance: Pill count conducted under aseptic conditions, in front of the patient. Neither the pills nor the bottle was removed from the patient's sight at any time. Once count was completed pills were immediately returned to the patient in their original bottle.  Medication: Hydrocodone /APAP Pill/Patch Count: 13 of 90 pills/patches remain Pill/Patch Appearance: Markings consistent with prescribed medication Bottle Appearance: Standard pharmacy container. Clearly labeled. Filled Date: 6 / 86 / 2025 Last Medication intake:  TodaySafety precautions to be maintained throughout the outpatient stay will include: orient to surroundings, keep bed in low position, maintain call bell within reach at all times, provide assistance with transfer out of bed and ambulation.

## 2024-02-29 DIAGNOSIS — G4734 Idiopathic sleep related nonobstructive alveolar hypoventilation: Secondary | ICD-10-CM | POA: Diagnosis not present

## 2024-02-29 DIAGNOSIS — Z9049 Acquired absence of other specified parts of digestive tract: Secondary | ICD-10-CM | POA: Diagnosis not present

## 2024-02-29 DIAGNOSIS — G894 Chronic pain syndrome: Secondary | ICD-10-CM | POA: Diagnosis not present

## 2024-02-29 DIAGNOSIS — K219 Gastro-esophageal reflux disease without esophagitis: Secondary | ICD-10-CM | POA: Diagnosis not present

## 2024-02-29 DIAGNOSIS — G4733 Obstructive sleep apnea (adult) (pediatric): Secondary | ICD-10-CM | POA: Diagnosis not present

## 2024-02-29 DIAGNOSIS — Z881 Allergy status to other antibiotic agents status: Secondary | ICD-10-CM | POA: Diagnosis not present

## 2024-02-29 DIAGNOSIS — R0902 Hypoxemia: Secondary | ICD-10-CM | POA: Diagnosis not present

## 2024-02-29 DIAGNOSIS — I081 Rheumatic disorders of both mitral and tricuspid valves: Secondary | ICD-10-CM | POA: Diagnosis not present

## 2024-02-29 DIAGNOSIS — R0609 Other forms of dyspnea: Secondary | ICD-10-CM | POA: Diagnosis not present

## 2024-02-29 DIAGNOSIS — Z882 Allergy status to sulfonamides status: Secondary | ICD-10-CM | POA: Diagnosis not present

## 2024-02-29 DIAGNOSIS — Z79899 Other long term (current) drug therapy: Secondary | ICD-10-CM | POA: Diagnosis not present

## 2024-02-29 DIAGNOSIS — Z90710 Acquired absence of both cervix and uterus: Secondary | ICD-10-CM | POA: Diagnosis not present

## 2024-02-29 DIAGNOSIS — Z79811 Long term (current) use of aromatase inhibitors: Secondary | ICD-10-CM | POA: Diagnosis not present

## 2024-02-29 DIAGNOSIS — Z888 Allergy status to other drugs, medicaments and biological substances status: Secondary | ICD-10-CM | POA: Diagnosis not present

## 2024-02-29 DIAGNOSIS — Z923 Personal history of irradiation: Secondary | ICD-10-CM | POA: Diagnosis not present

## 2024-02-29 DIAGNOSIS — Z9221 Personal history of antineoplastic chemotherapy: Secondary | ICD-10-CM | POA: Diagnosis not present

## 2024-02-29 DIAGNOSIS — C50919 Malignant neoplasm of unspecified site of unspecified female breast: Secondary | ICD-10-CM | POA: Diagnosis not present

## 2024-02-29 DIAGNOSIS — Z9012 Acquired absence of left breast and nipple: Secondary | ICD-10-CM | POA: Diagnosis not present

## 2024-02-29 DIAGNOSIS — J439 Emphysema, unspecified: Secondary | ICD-10-CM | POA: Diagnosis not present

## 2024-02-29 DIAGNOSIS — I272 Pulmonary hypertension, unspecified: Secondary | ICD-10-CM | POA: Diagnosis not present

## 2024-03-03 ENCOUNTER — Encounter: Payer: Self-pay | Admitting: Nurse Practitioner

## 2024-03-03 LAB — TOXASSURE SELECT 13 (MW), URINE

## 2024-03-13 DIAGNOSIS — F332 Major depressive disorder, recurrent severe without psychotic features: Secondary | ICD-10-CM | POA: Diagnosis not present

## 2024-03-13 DIAGNOSIS — F4312 Post-traumatic stress disorder, chronic: Secondary | ICD-10-CM | POA: Diagnosis not present

## 2024-03-13 DIAGNOSIS — F419 Anxiety disorder, unspecified: Secondary | ICD-10-CM | POA: Diagnosis not present

## 2024-03-20 DIAGNOSIS — L409 Psoriasis, unspecified: Secondary | ICD-10-CM | POA: Diagnosis not present

## 2024-03-20 DIAGNOSIS — L408 Other psoriasis: Secondary | ICD-10-CM | POA: Diagnosis not present

## 2024-03-20 DIAGNOSIS — Z79899 Other long term (current) drug therapy: Secondary | ICD-10-CM | POA: Diagnosis not present

## 2024-03-27 DIAGNOSIS — F419 Anxiety disorder, unspecified: Secondary | ICD-10-CM | POA: Diagnosis not present

## 2024-03-27 DIAGNOSIS — F332 Major depressive disorder, recurrent severe without psychotic features: Secondary | ICD-10-CM | POA: Diagnosis not present

## 2024-03-27 DIAGNOSIS — F4312 Post-traumatic stress disorder, chronic: Secondary | ICD-10-CM | POA: Diagnosis not present

## 2024-03-27 DIAGNOSIS — G894 Chronic pain syndrome: Secondary | ICD-10-CM | POA: Diagnosis not present

## 2024-03-27 DIAGNOSIS — G2570 Drug induced movement disorder, unspecified: Secondary | ICD-10-CM | POA: Diagnosis not present

## 2024-03-28 DIAGNOSIS — J449 Chronic obstructive pulmonary disease, unspecified: Secondary | ICD-10-CM | POA: Diagnosis not present

## 2024-04-13 DIAGNOSIS — I371 Nonrheumatic pulmonary valve insufficiency: Secondary | ICD-10-CM | POA: Diagnosis not present

## 2024-04-13 DIAGNOSIS — I272 Pulmonary hypertension, unspecified: Secondary | ICD-10-CM | POA: Diagnosis not present

## 2024-04-17 ENCOUNTER — Other Ambulatory Visit: Payer: Self-pay | Admitting: Family Medicine

## 2024-04-17 ENCOUNTER — Encounter: Payer: Self-pay | Admitting: Family Medicine

## 2024-04-17 ENCOUNTER — Telehealth: Payer: Self-pay | Admitting: Family Medicine

## 2024-04-17 DIAGNOSIS — G894 Chronic pain syndrome: Secondary | ICD-10-CM | POA: Diagnosis not present

## 2024-04-17 DIAGNOSIS — F411 Generalized anxiety disorder: Secondary | ICD-10-CM | POA: Diagnosis not present

## 2024-04-17 DIAGNOSIS — F332 Major depressive disorder, recurrent severe without psychotic features: Secondary | ICD-10-CM | POA: Diagnosis not present

## 2024-04-17 DIAGNOSIS — G2570 Drug induced movement disorder, unspecified: Secondary | ICD-10-CM | POA: Diagnosis not present

## 2024-04-17 DIAGNOSIS — G2401 Drug induced subacute dyskinesia: Secondary | ICD-10-CM | POA: Insufficient documentation

## 2024-04-17 DIAGNOSIS — K219 Gastro-esophageal reflux disease without esophagitis: Secondary | ICD-10-CM

## 2024-04-17 DIAGNOSIS — F4312 Post-traumatic stress disorder, chronic: Secondary | ICD-10-CM | POA: Diagnosis not present

## 2024-04-17 MED ORDER — VALBENAZINE TOSYLATE 80 MG PO CAPS: 80.0000 mg | ORAL_CAPSULE | Freq: Every day | ORAL | 0 refills | Status: DC

## 2024-04-17 NOTE — Telephone Encounter (Signed)
 NP called from [facility] , Heron Moats, explaining patient developed tardive dyskenisia due to seroquel and asked if okay to give her Ingrezza . Explained her liver enzymes only slightly elevated and since it is affecting her quality of life okay to try and monitor levels

## 2024-04-27 DIAGNOSIS — I10 Essential (primary) hypertension: Secondary | ICD-10-CM | POA: Diagnosis not present

## 2024-04-27 DIAGNOSIS — E213 Hyperparathyroidism, unspecified: Secondary | ICD-10-CM | POA: Diagnosis not present

## 2024-04-27 LAB — MICROALBUMIN / CREATININE URINE RATIO: Microalb Creat Ratio: 17.8

## 2024-05-03 NOTE — Progress Notes (Signed)
 Judy Allen                                          MRN: [id number]   05/03/2024   The [facility] Quality Team Specialist reviewed this patient medical record for the purposes of chart review for care gap closure. The following were reviewed: abstraction for care gap closure-kidney health evaluation for diabetes:eGFR  and uACR.    [facility]BCI Quality Team

## 2024-05-07 ENCOUNTER — Encounter: Payer: Self-pay | Admitting: Family Medicine

## 2024-05-07 ENCOUNTER — Ambulatory Visit: Admitting: Family Medicine

## 2024-05-07 VITALS — BP 122/74 | HR 81 | Resp 16 | Ht 64.0 in | Wt 182.0 lb

## 2024-05-07 DIAGNOSIS — E785 Hyperlipidemia, unspecified: Secondary | ICD-10-CM | POA: Diagnosis not present

## 2024-05-07 DIAGNOSIS — J9611 Chronic respiratory failure with hypoxia: Secondary | ICD-10-CM

## 2024-05-07 DIAGNOSIS — C50919 Malignant neoplasm of unspecified site of unspecified female breast: Secondary | ICD-10-CM | POA: Diagnosis not present

## 2024-05-07 DIAGNOSIS — K219 Gastro-esophageal reflux disease without esophagitis: Secondary | ICD-10-CM

## 2024-05-07 DIAGNOSIS — Z23 Encounter for immunization: Secondary | ICD-10-CM | POA: Diagnosis not present

## 2024-05-07 DIAGNOSIS — E1169 Type 2 diabetes mellitus with other specified complication: Secondary | ICD-10-CM | POA: Diagnosis not present

## 2024-05-07 DIAGNOSIS — K629 Disease of anus and rectum, unspecified: Secondary | ICD-10-CM

## 2024-05-07 DIAGNOSIS — E210 Primary hyperparathyroidism: Secondary | ICD-10-CM

## 2024-05-07 DIAGNOSIS — I10 Essential (primary) hypertension: Secondary | ICD-10-CM

## 2024-05-07 DIAGNOSIS — J432 Centrilobular emphysema: Secondary | ICD-10-CM | POA: Diagnosis not present

## 2024-05-07 DIAGNOSIS — K7460 Unspecified cirrhosis of liver: Secondary | ICD-10-CM

## 2024-05-07 DIAGNOSIS — I272 Pulmonary hypertension, unspecified: Secondary | ICD-10-CM

## 2024-05-07 DIAGNOSIS — E538 Deficiency of other specified B group vitamins: Secondary | ICD-10-CM | POA: Diagnosis not present

## 2024-05-07 DIAGNOSIS — G894 Chronic pain syndrome: Secondary | ICD-10-CM

## 2024-05-07 DIAGNOSIS — L408 Other psoriasis: Secondary | ICD-10-CM | POA: Diagnosis not present

## 2024-05-07 DIAGNOSIS — F339 Major depressive disorder, recurrent, unspecified: Secondary | ICD-10-CM | POA: Diagnosis not present

## 2024-05-07 LAB — POCT GLYCOSYLATED HEMOGLOBIN (HGB A1C): Hemoglobin A1C: 6 % — AB (ref 4.0–5.6)

## 2024-05-07 MED ORDER — OMEGA-3-ACID ETHYL ESTERS 1 G PO CAPS: ORAL_CAPSULE | ORAL | 1 refills | Status: AC

## 2024-05-07 MED ORDER — OMEPRAZOLE 40 MG PO CPDR: 40.0000 mg | DELAYED_RELEASE_CAPSULE | Freq: Every day | ORAL | 1 refills | Status: AC

## 2024-05-07 MED ORDER — CYANOCOBALAMIN 1000 MCG/ML IJ SOLN: 1000.0000 ug | Freq: Once | INTRAMUSCULAR | Status: DC

## 2024-05-07 MED ORDER — ROSUVASTATIN CALCIUM 10 MG PO TABS: 10.0000 mg | ORAL_TABLET | Freq: Every day | ORAL | 1 refills | Status: AC

## 2024-05-07 MED ORDER — CYANOCOBALAMIN 1000 MCG/ML IJ SOLN
1000.0000 ug | Freq: Once | INTRAMUSCULAR | Status: AC
  Administered 2024-05-07: 1000 ug via INTRAMUSCULAR

## 2024-05-07 NOTE — Progress Notes (Signed)
 Name: Judy Allen   MRN: [id number]    DOB: [birth year]   Date:05/07/2024       Progress Note  Subjective  Chief Complaint  Chief Complaint  Patient presents with   Medical Management of Chronic Issues    Req DM exam from Dr.Pate   Discussed the use of AI scribe software for clinical note transcription with the patient, who gave verbal consent to proceed.  History of Present Illness Judy Allen is a 68 year old female with diabetes and cirrhosis who presents with a lump on her left flank and for medication refills.  She has a new lump on her left flank for about four weeks, described as a 'knot'.  She was diagnosed with cirrhosis approximately three to four months ago based on lab levels and an ultrasound. She experiences occasional pain on the right side but no other significant symptoms. She reports being told her cirrhosis is likely related to fatty liver, and she has a history of hyperlipidemia, hypertension, obesity, and diabetes.  Her diabetes was diagnosed in 2019 with an A1c of 6.6. She manages it with diet and reports her A1c is now 6 percent. She experiences diabetic neuropathy and takes gabapentin  for pain management. She is not on metformin .  She has a history of hypertension, which was previously uncontrolled but is now stable. She takes telmisartan  80 mg, nifedipine  30 mg, metoprolol  50 mg, and torsemide  5 mg daily. She experiences occasional low blood pressure and lightheadedness.  She has a history of psoriasis, managed with topical creams and Skyrizi injections every three months. She reports improvement in her symptoms since starting Skyrizi.  She has a history of major depression and tardive dyskinesia, for which she takes Zoloft 100 mg, Effexor  three times daily, and Ingrezza . She sees a Therapist, sports for management.  She has pulmonary hypertension, central lobular emphysema, and chronic respiratory failure with hypoxia. She uses oxygen  therapy, 4 liters when active  and 2 liters at rest, and takes Noro and Ventolin  as needed for infrequent wheezing. She quit smoking in 2008.  She has a history of breast cancer treated with Arimidex  for five years and a history of anal cancer treated with radiation. She reports new anal lesions resembling hemorrhoids.  She has hyperparathyroidism, previously managed by an endocrinologist, but is not currently under treatment. She has a history of high triglycerides and takes rosuvastatin  and Lovaza  for management.  She reports chronic pain in her neck and back, managed with hydrocodone  and cyclobenzaprine . Her current pain level is 2 in the neck, with no significant back pain.  She has a history of osteopenia and takes calcium  with vitamin D . Her B12 levels were low, and she has been advised to take supplements.    Patient Active Problem List   Diagnosis Date Noted   Tardive dyskinesia 04/17/2024   Liver cirrhosis (HCC) 12/07/2023   Normocytic anemia 12/07/2023   Pulmonary hypertension (HCC) 05/16/2023   Osteopenia 11/24/2022   Macular degeneration 08/30/2022   Medication management 08/20/2022   Breast cancer (HCC) 07/26/2022   Breast cancer, left (HCC) 07/10/2022   Invasive carcinoma of breast (HCC) 07/10/2022   Goals of care, counseling/discussion 07/10/2022   Cystocele with rectocele 12/28/2021   S/P vaginal hysterectomy 12/28/2021   Bilateral hip pain 11/03/2021   Hypotension due to drugs 05/03/2021   Disorder of SI (sacroiliac) joint 11/11/2020   Arthritis of sacroiliac joint of both sides 11/11/2020   Chronic radicular lumbar pain 11/11/2020   Hyperparathyroidism 08/25/2019  Type 2 diabetes mellitus with obesity 11/06/2018   Cervical radiculopathy (left) 08/31/2018   Osteoarthritis of spine with radiculopathy, cervical region 08/31/2018   History of anal cancer 05/30/2018   Myofascial pain syndrome, cervical 05/16/2018   Chronic pain syndrome 05/16/2018   Cervical facet joint syndrome 05/16/2018    Perihepatitis (HCC) 09/27/2017   Abnormal LFTs 09/27/2017   Fatty liver 12/22/2016   Chronic respiratory failure with hypoxia, on home oxygen  therapy (HCC) 07/28/2016   Centrilobular emphysema (HCC) 07/14/2016   Abnormal Pap smear of cervix 04/14/2016   Anal squamous cell carcinoma (HCC) 03/09/2016   Non-thrombocytopenic purpura 01/12/2016   Chronic constipation 10/16/2015   History of fusion of cervical spine 04/02/2015   Benign hypertension 01/16/2015   Chronic cervical pain 01/16/2015   Constipation 01/16/2015   Gastric reflux 01/16/2015   Hypertriglyceridemia 01/16/2015   Edema leg 01/16/2015   Chronic recurrent major depressive disorder 01/16/2015   Dysmetabolic syndrome 01/16/2015   Obstructive apnea 01/16/2015   Psoriasis 01/16/2015   Allergic rhinitis 01/16/2015   Bursitis, trochanteric 01/16/2015   GAD (generalized anxiety disorder) 01/16/2015   Tachycardia, paroxysmal [facility])     Past Surgical History:  Procedure Laterality Date   ABDOMINAL HYSTERECTOMY  Dec 28, 2021   ANTERIOR AND POSTERIOR REPAIR N/A 12/28/2021   Procedure: ANTERIOR (CYSTOCELE) AND POSTERIOR REPAIR (RECTOCELE);  Surgeon: Connell Davies, MD;  Location: [facility];  Service: Gynecology;  Laterality: N/A;   ANTERIOR CERVICAL DECOMP/DISCECTOMY FUSION N/A 2001   PROCEDURE: ACDF C4-C7   ANTERIOR FUSION LUMBAR SPINE  1990   plate in back, not metal   BREAST BIOPSY Left 07/05/2022   US  Bx, Ribbon Clip, Path Pending   BREAST BIOPSY Left 07/05/2022   US  LT BREAST BX W LOC DEV 1ST LESION IMG BX SPEC US  GUIDE 07/05/2022 [facility]   BREAST BIOPSY Left 07/22/2022   US  LT RADIO FREQUENCY TAG LOC US  GUIDE 07/22/2022 [facility]   BREAST BIOPSY Left 06/08/2023   u/s bx 2:00 10 cmfn heart clip, path pending   BREAST BIOPSY Left 06/08/2023   US  LT BREAST BX W LOC DEV 1ST LESION IMG BX SPEC US  GUIDE 06/08/2023 [facility]   BREAST SURGERY  07/06/2022   This was a biopsy   CARDIAC CATHETERIZATION   2009?    [facility]   CARDIAC CATHETERIZATION  05/2012   [facility]: Mild hypertension 37/12 with a mean pressure of 21 mm mercury   CHOLECYSTECTOMY N/A 09/12/2017   Procedure: LAPAROSCOPIC CHOLECYSTECTOMY WITH INTRAOPERATIVE CHOLANGIOGRAM;  Surgeon: Dessa Reyes ORN, MD;  Location: [facility];  Service: General;  Laterality: N/A;   COLONOSCOPY  2018   LIVER BIOPSY N/A 09/12/2017   Procedure: LIVER BIOPSY;  Surgeon: Dessa Reyes ORN, MD;  Location: [facility];  Service: General;  Laterality: N/A;   MASS EXCISION Left 02/23/2016   Procedure: EXCISION MASS;  Surgeon: Louanne KANDICE Muse, MD;  Location: [facility];  Service: General;  Laterality: Left;   PART MASTECTOMY,RADIO FREQUENCY LOCALIZER,AXILLARY SENTINEL NODE BIOPSY Left 07/26/2022   INVASIVE MAMMARY CARCINOMA, NO SPECIAL TYPE.  - DUCTAL CARCINOMA IN SITU, LOW GRADE margins 6mm closest anterior  Cintron-Diaz, Edgardo   PORT-A-CATH REMOVAL  2018   PORTACATH PLACEMENT Left 03/12/2016   Procedure: INSERTION PORT-A-CATH;  Surgeon: Louanne KANDICE Muse, MD;  Location: [facility];  Service: General;  Laterality: Left;   SPINE SURGERY  1995 I believe   TONSILLECTOMY Bilateral 1959   TRIGGER FINGER RELEASE Right 09/2018   Dr. Cleotilde    TUBAL LIGATION  s/p   VAGINAL HYSTERECTOMY N/A 12/28/2021   Procedure: HYSTERECTOMY VAGINAL;  Surgeon: Connell Davies, MD;  Location: [facility];  Service: Gynecology;  Laterality: N/A;    Family History  Problem Relation Age of Onset   Heart attack Mother    Asthma Mother    COPD Mother    Varicose Veins Mother    Liver cancer Father 32   Cancer Father    Cancer Brother        unk type; metastatic   Breast cancer Maternal Aunt        dx 20s-30s   Breast cancer Cousin        dx 53s   Bladder Cancer Neg Hx    Kidney cancer Neg Hx    Colon cancer Neg Hx     Social History   Tobacco Use   Smoking status: Former    Current packs/day: 0.00    Average packs/day: 1 pack/day for 42.4 years (42.4 ttl  pk-yrs)    Types: Cigarettes    Start date: 08/10/1963    Quit date: 01/15/2006    Years since quitting: 18.3   Smokeless tobacco: Never  Substance Use Topics   Alcohol use: No     Current Outpatient Medications:    albuterol  (PROVENTIL  HFA;VENTOLIN  HFA) 108 (90 Base) MCG/ACT inhaler, Inhale 2 puffs into the lungs every 6 (six) hours as needed for wheezing or shortness of breath., Disp: , Rfl:    anastrozole  (ARIMIDEX ) 1 MG tablet, TAKE 1 TABLET DAILY, Disp: 90 tablet, Rfl: 1   Cholecalciferol  (VITAMIN D3) 1000 units CAPS, Take 1 capsule by mouth daily., Disp: , Rfl:    clobetasol  cream (TEMOVATE ) 0.05 %, Apply topically., Disp: , Rfl:    cyclobenzaprine  (FLEXERIL ) 5 MG tablet, Take 1 tablet (5 mg total) by mouth 3 (three) times daily as needed for muscle spasms., Disp: 90 tablet, Rfl: 5   desonide  (DESOWEN ) 0.05 % cream, Apply twice daily to affected areas on the face and skin folds (groin and under breasts)., Disp: , Rfl:    gabapentin  (NEURONTIN ) 400 MG capsule, Take 1 capsule (400 mg total) by mouth 3 (three) times daily., Disp: 90 capsule, Rfl: 5   HYDROcodone  Bitartrate ER 10 MG CP12, Take 10 mg by mouth every 12 (twelve) hours. Must last 30 days., Disp: 60 capsule, Rfl: 0   metoprolol  succinate (TOPROL -XL) 50 MG 24 hr tablet, Take 1 tablet by mouth daily., Disp: , Rfl:    Multiple Vitamins-Minerals (EYE VITAMINS & MINERALS PO), Take 1 capsule by mouth in the morning. [employer] Eye Vitamins, Disp: , Rfl:    NIFEdipine  (PROCARDIA -XL/NIFEDICAL-XL) 30 MG 24 hr tablet, Take 30 mg by mouth at bedtime., Disp: , Rfl:    omega-3 acid ethyl esters (LOVAZA ) 1 g capsule, TAKE 2 CAPSULES BY MOUTH TWO TIMES A DAY, Disp: 360 capsule, Rfl: 1   omeprazole  (PRILOSEC) 40 MG capsule, Take 1 capsule (40 mg total) by mouth daily., Disp: 90 capsule, Rfl: 1   OXYGEN , Inhale 2-4 L into the lungs continuous. 2 L qd and 4 L qhs, Disp: , Rfl:    Potassium Gluconate 80 MG TABS, Take 1 tablet by mouth daily., Disp:  , Rfl:    rosuvastatin  (CRESTOR ) 10 MG tablet, Take 1 tablet (10 mg total) by mouth daily., Disp: 90 tablet, Rfl: 1   sertraline (ZOLOFT) 100 MG tablet, Take 100 mg by mouth daily., Disp: , Rfl:    telmisartan  (MICARDIS ) 80 MG tablet, Take 80 mg by mouth daily.,  Disp: , Rfl:    torsemide  (DEMADEX ) 5 MG tablet, Take 5 mg by mouth every morning., Disp: , Rfl:    umeclidinium-vilanterol (ANORO ELLIPTA ) 62.5-25 MCG/ACT AEPB, Inhale 1 puff into the lungs in the morning., Disp: , Rfl:    valbenazine  (INGREZZA ) 80 MG capsule, Take 80 mg by mouth daily., Disp: , Rfl:    venlafaxine  XR (EFFEXOR -XR) 75 MG 24 hr capsule, Take 1 capsule (75 mg total) by mouth 3 (three) times daily. (Patient taking differently: Take 75 mg by mouth 4 (four) times daily.), Disp: 270 capsule, Rfl: 1   sertraline (ZOLOFT) 25 MG tablet, Take 25 mg by mouth daily. (Patient not taking: Reported on 05/07/2024), Disp: , Rfl:   Allergies  Allergen Reactions   Aczone [Dapsone] Other (See Comments)    Hypoxemia   Abilify  [Aripiprazole ]     Tardive dyskinesis    Atarax  [Hydroxyzine ]     Hypoxemia    Hydrochlorothiazide      History of hyperparathyroidism    Sulfa Antibiotics Rash   Sulfasalazine Rash    I personally reviewed active problem list, medication list, allergies with the patient/caregiver today.   ROS  Ten systems reviewed and is negative except as mentioned in HPI    Objective Physical Exam VITALS: BP- 122/74 CONSTITUTIONAL: Patient appears well-developed and well-nourished. No distress. HEENT: Head atraumatic, normocephalic, neck supple. CARDIOVASCULAR: Normal rate, regular rhythm and normal heart sounds. No murmur heard. No BLE edema. PULMONARY: Effort normal and breath sounds normal. No respiratory distress. ABDOMINAL: Abdomen normal. Lipoma on left flank. There is no tenderness or distention. SKIN: psoriatic plaques on arms and legs PSYCHIATRIC: Patient has a normal mood and affect. Behavior is normal.  Judgment and thought content normal.  Vitals:   05/07/24 0759  BP: 122/74  Pulse: 81  Resp: 16  SpO2: 92%  Weight: 182 lb (82.6 kg)  Height: 5' 4 (1.626 m)    Body mass index is 31.24 kg/m.  Recent Results (from the past 2160 hours)  [employer] Select 13 (MW), Urine     Status: None   Collection Time: 02/29/24  9:00 AM  Result Value Ref Range   Summary FINAL     Comment: ==================================================================== [employer] Select [employer] (MW) ==================================================================== Test                             Result       Flag       Units  Drug Present   Hydrocodone                     3742                    ng/mg creat   Hydromorphone                   407                     ng/mg creat   Dihydrocodeine                 186                     ng/mg creat   Norhydrocodone                 2130                    ng/mg creat  Sources of hydrocodone  include scheduled prescription medications.    Hydromorphone , dihydrocodeine and norhydrocodone are expected    metabolites of hydrocodone . Hydromorphone  and dihydrocodeine are    also available as scheduled prescription medications.  ==================================================================== Test                      Result    Flag   Units      Ref Range   Creatinine              43               mg/dL      >=79 ============= ======================================================= Declared Medications:  Medication list was not provided. ==================================================================== For clinical consultation, please call [phone]. ====================================================================   POCT glycosylated hemoglobin (Hb A1C)     Status: Abnormal   Collection Time: 05/07/24  8:03 AM  Result Value Ref Range   Hemoglobin A1C 6.0 (A) 4.0 - 5.6 %   HbA1c POC (<> result, manual entry)     HbA1c, POC (prediabetic range)      HbA1c, POC (controlled diabetic range)        PHQ2/9:    05/07/2024    7:55 AM 02/28/2024   11:30 AM 11/24/2023    2:30 PM 11/03/2023    9:19 AM 05/16/2023    2:41 PM  Depression screen PHQ 2/9  Decreased Interest 1 1 3 3  0  Down, Depressed, Hopeless 1 1 3 3  0  PHQ - 2 Score 2 2 6 6  0  Altered sleeping 0  3 3 0  Tired, decreased energy 0  3 3 0  Change in appetite 0  0 0 0  Feeling bad or failure about yourself  0  0 0 0  Trouble concentrating 1  3 3  0  Moving slowly or fidgety/restless 0  0 0 0  Suicidal thoughts 0  0 0 0  PHQ-9 Score 3  15 15  0  Difficult doing work/chores Somewhat difficult   Very difficult     phq 9 is positive  Fall Risk:    05/07/2024    7:48 AM 02/28/2024   11:30 AM 11/24/2023    2:30 PM 11/03/2023    9:19 AM 08/25/2023   11:10 AM  Fall Risk   Falls in the past year? 0 0 0 0 0  Number falls in past yr: 0   0 0  Injury with Fall? 0   0   Risk for fall due to : No Fall Risks   No Fall Risks No Fall Risks  Follow up Falls evaluation completed   Falls prevention discussed;Education provided;Falls evaluation completed Falls evaluation completed     Assessment & Plan Cirrhosis of liver without ascites Cirrhosis likely due to metabolic dysfunctions. No ascites. Occasional right-sided abdominal pain. - Limit salt intake to <2000 mg/day. - Maintain high-protein diet. - Avoid NSAIDs, green tea, turmeric, curcumin. - Schedule ultrasound in 6 months as recommended by GI  Pulmonary hypertension with chronic respiratory failure and centrilobular emphysema Chronic respiratory failure with hypoxia managed with continuous oxygen . Pulmonary hypertension and centrilobular emphysema present. - Continue oxygen  therapy as prescribed. - Use Ventolin  as needed for wheezing. - Continue Anoro - Keep follow up with pulmonologist  Type 2 diabetes mellitus with diabetic neuropathy Type 2 diabetes well-controlled with A1c of 6.0%. Diabetic neuropathy managed with  gabapentin . - Continue dietary management focusing on reducing carbohydrates. - Continue gabapentin .  Essential hypertension Hypertension well-managed with current medications.  Blood pressure stable. - Continue telmisartan , nifedipine , metoprolol , torsemide .  Hyperlipidemia associated with DM Hyperlipidemia managed with rosuvastatin  and omega-3 fatty acids. - Continue rosuvastatin  10 mg daily. - Continue Lovaza  1 g, two capsules twice a day.  Obesity Obesity contributing to metabolic dysfunctions. Weight increased slightly. - Encourage dietary modifications and physical activity.  Obstructive sleep apnea Obstructive sleep apnea contributing to metabolic dysfunctions.  Primary hyperparathyroidism Primary hyperparathyroidism with no symptoms. Last parathyroid  level check over a year ago. - Order parathyroid  level test.  Psoriasis (including inverse psoriasis) Psoriasis managed with topical treatments and Skyrizi. Improvement noted. - Continue Skyrizi injections every three months. - Continue topical treatments as prescribed.  Major depressive disorder and tardive dyskinesia Major depressive disorder managed with Zoloft and Effexor . Tardive dyskinesia managed with Ingrezza . - Continue Zoloft 100 mg daily. - Continue Effexor  75, 3 capsules daily  - Continue Ingrezza . - Under the care of psychiatrist   Invasive carcinoma of left breast on adjuvant therapy Invasive carcinoma treated, on Arimidex  for adjuvant therapy. No metastasis. - Continue Arimidex  for five years.  Anal lesions New anal lesions resembling hemorrhoids. Previous lesions benign, new lesions require evaluation. - Refer to surgeon for evaluation and possible biopsy.  Chronic pain (neck and back) Chronic neck and back pain managed with hydrocodone  and cyclobenzaprine . Current neck pain level 2/10. - Continue hydrocodone  as prescribed by pain clinic. - Continue cyclobenzaprine  as needed.  Hiatal hernia Hiatal  hernia present.  Mass left flank, likely lipoma Lipoma identified as benign fatty tissue mass. - Discuss with surgeon during referral for anal lesions. Consider ultrasound if needed.  Vitamin B12 deficiency Vitamin B12 deficiency with level of 229. Not taking supplements. - Administer B12 injection today. - Consider monthly B12 injections if oral supplementation is not feasible.

## 2024-05-08 ENCOUNTER — Ambulatory Visit: Payer: Self-pay | Admitting: Family Medicine

## 2024-05-08 LAB — LIPID PANEL
Cholesterol: 117 mg/dL (ref <200)
HDL: 43 mg/dL — ABNORMAL LOW (ref >=50)
LDL Cholesterol (Calc): 44 mg/dL
Non-HDL Cholesterol (Calc): 74 mg/dL (ref <130)
Total CHOL/HDL Ratio: 2.7 (calc) (ref <5.0)
Triglycerides: 259 mg/dL — ABNORMAL HIGH (ref <150)

## 2024-05-08 LAB — COMPREHENSIVE METABOLIC PANEL WITH GFR
AG Ratio: 1.7 (calc) (ref 1.0–2.5)
ALT: 29 U/L (ref 6–29)
AST: 25 U/L (ref 10–35)
Albumin: 5.1 g/dL (ref 3.6–5.1)
Alkaline phosphatase (APISO): 112 U/L (ref 37–153)
BUN: 13 mg/dL (ref 7–25)
CO2: 31 mmol/L (ref 20–32)
Calcium: 10.2 mg/dL (ref 8.6–10.4)
Chloride: 101 mmol/L (ref 98–110)
Creat: 0.79 mg/dL (ref 0.50–1.05)
Globulin: 3 g/dL (ref 1.9–3.7)
Glucose, Bld: 113 mg/dL — ABNORMAL HIGH (ref 65–99)
Potassium: 4.2 mmol/L (ref 3.5–5.3)
Sodium: 142 mmol/L (ref 135–146)
Total Bilirubin: 0.4 mg/dL (ref 0.2–1.2)
Total Protein: 8.1 g/dL (ref 6.1–8.1)
eGFR: 81 mL/min/1.73m2 (ref >=60)

## 2024-05-08 LAB — PTH, INTACT AND CALCIUM
Calcium: 10.2 mg/dL (ref 8.6–10.4)
PTH: 37 pg/mL (ref 8.6–77)

## 2024-05-15 ENCOUNTER — Ambulatory Visit: Admitting: General Surgery

## 2024-05-24 ENCOUNTER — Ambulatory Visit: Admitting: Nurse Practitioner

## 2024-05-24 ENCOUNTER — Ambulatory Visit
Admission: RE | Admit: 2024-05-24 | Discharge: 2024-05-24 | Disposition: A | Discharge status: Home / Self Care | Source: Ambulatory Visit | Attending: Nurse Practitioner | Admitting: Nurse Practitioner

## 2024-05-24 ENCOUNTER — Ambulatory Visit
Admission: RE | Admit: 2024-05-24 | Discharge: 2024-05-24 | Disposition: A | Discharge status: Home / Self Care | Attending: Nurse Practitioner | Admitting: Nurse Practitioner

## 2024-05-24 ENCOUNTER — Encounter: Payer: Self-pay | Admitting: Nurse Practitioner

## 2024-05-24 VITALS — BP 108/36 | HR 82 | Temp 97.0°F | Resp 18 | Ht 63.75 in | Wt 180.0 lb

## 2024-05-24 DIAGNOSIS — G894 Chronic pain syndrome: Secondary | ICD-10-CM | POA: Diagnosis not present

## 2024-05-24 DIAGNOSIS — M5412 Radiculopathy, cervical region: Secondary | ICD-10-CM

## 2024-05-24 DIAGNOSIS — Z79899 Other long term (current) drug therapy: Secondary | ICD-10-CM

## 2024-05-24 DIAGNOSIS — K7460 Unspecified cirrhosis of liver: Secondary | ICD-10-CM | POA: Diagnosis not present

## 2024-05-24 DIAGNOSIS — M5416 Radiculopathy, lumbar region: Secondary | ICD-10-CM

## 2024-05-24 DIAGNOSIS — M542 Cervicalgia: Secondary | ICD-10-CM | POA: Diagnosis not present

## 2024-05-24 DIAGNOSIS — G8929 Other chronic pain: Secondary | ICD-10-CM | POA: Diagnosis not present

## 2024-05-24 DIAGNOSIS — M4312 Spondylolisthesis, cervical region: Secondary | ICD-10-CM | POA: Diagnosis not present

## 2024-05-24 MED ORDER — HYDROCODONE BITARTRATE ER 10 MG PO CP12
10.0000 mg | ORAL_CAPSULE | Freq: Two times a day (BID) | ORAL | 0 refills | Status: AC
Start: 2024-07-07 — End: 2024-08-06

## 2024-05-24 MED ORDER — HYDROCODONE BITARTRATE ER 10 MG PO CP12
10.0000 mg | ORAL_CAPSULE | Freq: Two times a day (BID) | ORAL | 0 refills | Status: AC
Start: 2024-06-07 — End: 2024-07-07

## 2024-05-24 MED ORDER — HYDROCODONE BITARTRATE ER 10 MG PO CP12: 10.0000 mg | ORAL_CAPSULE | Freq: Two times a day (BID) | ORAL | 0 refills | Status: DC

## 2024-05-24 NOTE — Progress Notes (Signed)
 Nursing Pain Medication Assessment:  Safety precautions to be maintained throughout the outpatient stay will include: orient to surroundings, keep bed in low position, maintain call bell within reach at all times, provide assistance with transfer out of bed and ambulation.  Medication Inspection Compliance: Pill count conducted under aseptic conditions, in front of the patient. Neither the pills nor the bottle was removed from the patient's sight at any time. Once count was completed pills were immediately returned to the patient in their original bottle.  Medication: Hydrocodone /APAP Pill/Patch Count: 29 of 60 pills/patches remain Pill/Patch Appearance: Markings consistent with prescribed medication Bottle Appearance: Standard pharmacy container. Clearly labeled. Filled Date: 09 / 28 / 2025 Last Medication intake:  Today

## 2024-05-24 NOTE — Progress Notes (Signed)
 PROVIDER NOTE: Interpretation of information contained herein should be left to medically-trained personnel. Specific patient instructions are provided elsewhere under Patient Instructions section of medical record. This document was created in part using AI and STT-dictation technology, any transcriptional errors that may result from this process are unintentional.  Patient: Judy Allen  Service: E/M   PCP: Sowles, Krichna, MD  DOB: 09-Oct-1955  DOS: 05/24/2024  Provider: Emmy MARLA Blanch, NP  MRN: [id number]  Delivery: Face-to-face  Specialty: Interventional Pain Management  Type: Established Patient  Setting: Ambulatory outpatient facility  Specialty designation: 09  Referring Prov.: Sowles, Krichna, MD  Location: Outpatient office facility       History of present illness (HPI) Judy Allen, a 68 y.o. year old female, is here today because of her Neck pain. Judy Allen primary complain today is Neck Pain  Pertinent problems: Judy Allen has Chronic recurrent major depressive disorder (HCC); GAD (generalized anxiety disorder); Myofascial pain syndrome, cervical; Cervical facet joint syndrome; Cervical radiculopathy (Left); Osteoarthritis of spine with radiculopathy, cervical region, and Chronic pain syndrome on their pertinent problem list. Pain Assessment: Severity of Chronic pain is reported as a 5 /10. Location: Neck Mid/Left Arm. Onset: More than a month ago. Quality: Sharp. Timing: Constant. Modifying factor(s): Pain Medication. Vitals:  height is 5' 3.75 (1.619 m) and weight is 180 lb (81.6 kg). Her temporal temperature is 97 F (36.1 C) (abnormal). Her blood pressure is 108/36 (abnormal) and her pulse is 82. Her respiration is 18.  BMI: Estimated body mass index is 31.14 kg/m as calculated from the following:   Height as of this encounter: 5' 3.75 (1.619 m).   Weight as of this encounter: 180 lb (81.6 kg).  Last encounter: 02/28/2024. Last procedure: Visit date not found.  Reason  for encounter: medication management.   Discussed the use of AI scribe software for clinical note transcription with the patient, who gave verbal consent to proceed.  History of Present Illness   [name] is a 68 year old female with COPD and cirrhosis who presents with neck pain. No change in medical history since last visit.  Patient's pain is at baseline.  Patient continues multimodal pain regimen as prescribed.  States that it provides pain relief and improvement in functional status. She complains of her neck pain that radiates to her bilateral arm. We offered her joint injection however she is not ready for joint injection. Advise her in the future if she wants to consider joint injection that help to reduce pain and inflammation around her neck area.   She experiences persistent neck pain primarily in the cervical region, with more intensity on the left side. The pain worsens with neck movement, especially when turning to the left. She does not recall when she last had cervical imaging, but reports having lumbar and knee x-rays in 2021. Previous injections were ineffective, leading to her discontinuation.  She has been using hydrocodone  for pain management for the past three months, but finds it less effective than expected. Due to her cirrhosis, she cannot use hydrocodone  with Tylenol . Tramadol  was effective in the past but eventually lost its efficacy. She is currently on oxygen  therapy for COPD, using 4 liters when walking and 2 liters when sitting, which limits her options for other pain medications like Butrans  patch.  She inquires about her gabapentin  prescription, which she has until August 26, 2024, but is unsure of her current supply.     Pharmacotherapy Assessment   Hydrocodone  ER  10 Mg every 12 hours as needed for pain.  Monitoring: [place] PMP: PDMP reviewed during this encounter.       Pharmacotherapy: No side-effects or adverse reactions reported. Compliance: No problems  identified. Effectiveness: Clinically acceptable.  [name], [place]  05/24/2024 10:00 AM  Sign when Signing Visit Nursing Pain Medication Assessment:  Safety precautions to be maintained throughout the outpatient stay will include: orient to surroundings, keep bed in low position, maintain call bell within reach at all times, provide assistance with transfer out of bed and ambulation.  Medication Inspection Compliance: Pill count conducted under aseptic conditions, in front of the patient. Neither the pills nor the bottle was removed from the patient's sight at any time. Once count was completed pills were immediately returned to the patient in their original bottle.  Medication: Hydrocodone /APAP Pill/Patch Count: 29 of 60 pills/patches remain Pill/Patch Appearance: Markings consistent with prescribed medication Bottle Appearance: Standard pharmacy container. Clearly labeled. Filled Date: 09 / 28 / 2025 Last Medication intake:  Today    UDS:  Summary  Date Value Ref Range Status  02/29/2024 FINAL  Final    Comment:    ==================================================================== [employer] [facility] (MW) ==================================================================== Test                             Result       Flag       Units  Drug Present   Hydrocodone                     3742                    ng/mg creat   Hydromorphone                   407                     ng/mg creat   Dihydrocodeine                 186                     ng/mg creat   Norhydrocodone                 2130                    ng/mg creat    Sources of hydrocodone  include scheduled prescription medications.    Hydromorphone , dihydrocodeine and norhydrocodone are expected    metabolites of hydrocodone . Hydromorphone  and dihydrocodeine are    also available as scheduled prescription medications.  ==================================================================== Test                       Result    Flag   Units      Ref Range   Creatinine              43               mg/dL      >=79 ==================================================================== Declared Medications:  Medication list was not provided. ==================================================================== For clinical consultation, please call [phone]. ====================================================================     No results found for: CBDTHCR No results found for: D8THCCBX No results found for: D9THCCBX  ROS  Constitutional: Denies any fever or chills Gastrointestinal: No reported hemesis, hematochezia, vomiting, or acute GI distress Musculoskeletal: Neck pain radiate down bilateral  arms and shoulders (L>R) Neurological: No reported episodes of acute onset apraxia, aphasia, dysarthria, agnosia, amnesia, paralysis, loss of coordination, or loss of consciousness  Medication Review  HYDROcodone  Bitartrate ER, Multiple Vitamins-Minerals, NIFEdipine , Oxygen -Helium, Potassium Gluconate, Vitamin D3, albuterol , anastrozole , clobetasol  cream, cyclobenzaprine , desonide , gabapentin , metoprolol  succinate, omega-3 acid ethyl esters, omeprazole , risankizumab-rzaa, rosuvastatin , sertraline, telmisartan , torsemide , umeclidinium-vilanterol, valbenazine , and venlafaxine  XR  History Review  Allergy: Judy Allen is allergic to aczone [dapsone], abilify  [aripiprazole ], atarax  [hydroxyzine ], hydrochlorothiazide , sulfa antibiotics, and sulfasalazine. Drug: Judy Allen  reports no history of drug use. Alcohol:  reports no history of alcohol use. Tobacco:  reports that she quit smoking about 18 years ago. Her smoking use included cigarettes. She started smoking about 60 years ago. She has a 42.4 pack-year smoking history. She has never used smokeless tobacco. Social: Judy Allen  reports that she quit smoking about 18 years ago. Her smoking use included cigarettes. She started smoking about 60 years ago. She  has a 42.4 pack-year smoking history. She has never used smokeless tobacco. She reports that she does not drink alcohol and does not use drugs. Medical:  has a past medical history of Allergy, Anal squamous cell carcinoma (HCC) (03/10/2016), Anemia, Anxiety, Aortic atherosclerosis, Arthritis, Breast cancer, left (HCC) (07/10/2022), Cataract (09/2021), Complication of anesthesia, Constipation, COPD (chronic obstructive pulmonary disease) (HCC), Coronary artery calcification seen on CT scan, Depression, Diastolic dysfunction (06/08/2021), Emphysema of lung (HCC), GERD (gastroesophageal reflux disease), Goals of care, counseling/discussion (07/10/2022), H/O allergic rhinitis, Heart murmur, Hemorrhoid, History of fusion of cervical spine, History of kidney stones, Hyperlipidemia, Hyperparathyroidism, Hypertension, Macular degeneration, 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 Surgical: Judy Allen  has a past surgical history that includes Tubal ligation; Anterior fusion lumbar spine (1990); Mass excision (Left, 02/23/2016); Colonoscopy (2018); Portacath placement (Left, 03/12/2016); Cardiac catheterization (2009? ); Cardiac catheterization (05/2012); Port-a-cath removal (2018); Liver biopsy (N/A, 09/12/2017); Cholecystectomy (N/A, 09/12/2017); Trigger finger release (Right, 09/2018); Anterior cervical decomp/discectomy fusion (N/A, 2001); Tonsillectomy (Bilateral, 1959); Anterior and posterior repair (N/A, 12/28/2021); Vaginal hysterectomy (N/A, 12/28/2021); Breast biopsy (Left, 07/05/2022); Breast biopsy  (Left, 07/05/2022); Breast biopsy (Left, 07/22/2022); Part mastectomy,radio frequency localizer,axillary sentinel node biopsy (Left, 07/26/2022); Breast biopsy (Left, 06/08/2023); Breast biopsy (Left, 06/08/2023); Abdominal hysterectomy (Dec 28, 2021); Breast surgery (07/06/2022); and Spine surgery (1995 I believe). Family: family history includes Asthma in her mother; Breast cancer in her cousin and maternal aunt; COPD in her mother; Cancer in her brother and father; Heart attack in her mother; Liver cancer (age of onset: 75) in her father; Varicose Veins in her mother.  Laboratory Chemistry Profile   Renal Lab Results  Component Value Date   BUN 13 05/07/2024   CREATININE 0.79 05/07/2024   LABCREA 127 05/16/2023   BCR SEE NOTE: 05/07/2024   GFRAA 75 04/23/2019   GFRNONAA >60 12/07/2023    Hepatic Lab Results  Component Value Date   AST 25 05/07/2024   ALT 29 05/07/2024   ALBUMIN 4.4 12/07/2023   ALKPHOS 136 (H) 12/07/2023   HCVAB NON REACTIVE 12/07/2023   LIPASE 27 08/03/2017    Electrolytes Lab Results  Component Value Date   NA 142 05/07/2024   K 4.2 05/07/2024   CL 101 05/07/2024   CALCIUM  10.2 05/07/2024  CALCIUM  10.2 05/07/2024    Bone Lab Results  Component Value Date   VD25OH 57 07/22/2021    Inflammation (CRP: Acute Phase) (ESR: Chronic Phase) Lab Results  Component Value Date   CRP 1.6 (H) 12/21/2023   ESRSEDRATE 41 (H) 12/21/2023         Note: Above Lab results reviewed.  Recent Imaging Review  US  Abdomen Limited RUQ (LIVER/GB) CLINICAL DATA:  Splenomegaly, hepatomegaly.  EXAM: ULTRASOUND ABDOMEN LIMITED RIGHT UPPER QUADRANT  COMPARISON:  CT abdomen and pelvis Dec 15, 2023  FINDINGS: Gallbladder:  Surgically absent.  Common bile duct:  Diameter: 4.2 mm.  Liver:  No focal lesion. Increased echotexture with mild nodular contour. Right lobe liver measures at least 24 cm. Portal vein is patent on color Doppler imaging with normal direction  of blood flow towards the liver.  Other: None.  IMPRESSION: Hepatomegaly with increased echotexture and mild nodular contour, consistent with cirrhosis. No focal liver lesion identified.  Electronically Signed   By: Craig Farr M.D.   On: 12/29/2023 12:04 Note: Reviewed        Physical Exam  Vitals: BP (!) 108/36 (BP Location: Right Arm, Patient Position: Sitting, Cuff Size: Normal)   Pulse 82   Temp (!) 97 F (36.1 C) (Temporal)   Resp 18   Ht 5' 3.75 (1.619 m)   Wt 180 lb (81.6 kg)   LMP 03/04/2002 (Approximate)   BMI 31.14 kg/m  BMI: Estimated body mass index is 31.14 kg/m as calculated from the following:   Height as of this encounter: 5' 3.75 (1.619 m).   Weight as of this encounter: 180 lb (81.6 kg). Ideal: Ideal body weight: 54.1 kg (119 lb 5.2 oz) Adjusted ideal body weight: 65.1 kg (143 lb 9.5 oz) General appearance: Well nourished, well developed, and well hydrated. In no apparent acute distress Mental status: Alert, oriented x 3 (person, place, & time)       Respiratory: No evidence of acute respiratory distress Eyes: PERLA Musculoskeletal: Neck pain radiated to bilateral shoulders and arms Cervical Spine Exam  Skin & Axial Inspection: No masses, redness, edema, swelling, or associated skin lesions Alignment: Symmetrical Functional ROM: Pain restricted ROM, bilaterally Stability: No instability detected Muscle Tone/Strength: Functionally intact. No obvious neuro-muscular anomalies detected. Sensory (Neurological): Musculoskeletal pain pattern Palpation: No palpable anomalies              Upper Extremity (UE) Exam      Right  Left  Inspection    Skin color, temperature, and hair growth are WNL. No peripheral edema or cyanosis. No masses, redness, swelling, asymmetry, or associated skin lesions. No contractures.  Skin color, temperature, and hair growth are WNL. No peripheral edema or cyanosis. No masses, redness, swelling, asymmetry, or associated skin  lesions. No contractures.          Functional ROM    Pain restricted ROM          Pain restricted ROM                  Muscle Tone/Strength    Functionally intact. No obvious neuro-muscular anomalies detected.  Functionally intact. No obvious neuro-muscular anomalies detected.          Sensory (Neurological)    Musculoskeletal pain pattern          Musculoskeletal pain pattern                  Palpation    No palpable anomalies  No palpable anomalies                      Maneuver Shoulder abduction (deltoid/supraspinatus, axillary/supra scapular n,, C5) Elbow flexion (biceps brachial, musculoskeletal n, C5-6) Elbow extension (triceps, radial n, C7) Finger abduction (interossei, ulnar n, T1)    Shoulder abduction (deltoid/supraspinatus, axillary/supra scapular n,, C5) Elbow flexion (biceps brachial, musculoskeletal n, C5-6) Elbow extension (triceps, radial n, C7) Wrist extensors (C6) Finger extensors (C8) Finger abduction (interossei, ulnar n, T1)            Provocative Test    Phalen's test: deferred Tinel's test: deferred Apley's scratch test (touch opposite shoulder):  Action 1 (Across chest): deferred Action 2 (Overhead): deferred Action 3 (LB reach): deferred  Phalen's test: deferred Tinel's test: deferred Apley's scratch test (touch opposite shoulder):  Action 1 (Across chest): deferred Action 2 (Overhead): deferred Action 3 (LB reach): deferred             Level  Myotome  Dermatome  Sclerotome  ROM  C5  Elbow flexion  Lateral upper arm      C6  Wrist extension  Thumb and index      C7  Elbow extension  Middle finger      C8  Finger extension  Ring and pinky finger      T1  Finger abduction  Medial elbow and axilla                                                                                                                                         Assessment   Diagnosis Status  1. Cervical radiculopathy (left)   2. Chronic pain syndrome    3. Chronic cervical pain   4. Lumbar radiculopathy   5. Chronic radicular lumbar pain   6. Medication management    Controlled Controlled Controlled   Updated Problems: No problems updated.  Plan of Care  Problem-specific:  Assessment and Plan    Chronic pain syndrome: Patient's pain is well-controlled with hydrocodone  ER, will continue on current medication regimen.  Prescribing drug monitoring (PMP) reviewed, findings consistent with the use of prescribed medication and no evidence of narcotic misuse or abuse.  Urine drug screening (UDS) up-to-date and consistent with the use of prescribed medication.  Schedule follow-up in 90 days for medication management.  Chronic neck pain with cervical radiculopathy Persistent left-sided neck pain worsened by movement. Previous injections ineffective. No recent cervical imaging since 2021. - Order cervical spine x-ray. - Continue hydrocodone  for pain management. - Schedule virtual visit to review x-ray results. Patient's pain is well-controlled with hydrocodone  ER, will continue on current medication regimen.  Chronic obstructive pulmonary disease on home oxygen  COPD managed with home oxygen . Butrans  patch contraindicated due to oxygen  use.  Cirrhosis of liver Cirrhosis limits pain management options.  Follow-Up - Schedule virtual visit after x-ray completion to discuss results. -  Plan follow-up appointment in three months.       Judy Allen has a current medication list which includes the following long-term medication(s): gabapentin , omega-3 acid ethyl esters, omeprazole , potassium gluconate, rosuvastatin , sertraline, and venlafaxine  xr.  Pharmacotherapy (Medications Ordered): Meds ordered this encounter  Medications   HYDROcodone  Bitartrate ER 10 MG CP12    Sig: Take 10 mg by mouth every 12 (twelve) hours. Must last 30 days.    Dispense:  60 capsule    Refill:  0    Chronic Pain: STOP Act (Not applicable) Fill 1 day  early if closed on refill date. Avoid benzodiazepines within 8 hours of opioids   HYDROcodone  Bitartrate ER 10 MG CP12    Sig: Take 10 mg by mouth every 12 (twelve) hours. Must last 30 days.    Dispense:  60 capsule    Refill:  0    Chronic Pain: STOP Act (Not applicable) Fill 1 day early if closed on refill date. Avoid benzodiazepines within 8 hours of opioids   HYDROcodone  Bitartrate ER 10 MG CP12    Sig: Take 10 mg by mouth every 12 (twelve) hours. Must last 30 days.    Dispense:  60 capsule    Refill:  0    Chronic Pain: STOP Act (Not applicable) Fill 1 day early if closed on refill date. Avoid benzodiazepines within 8 hours of opioids   Orders:  Orders Placed This Encounter  Procedures   DG Cervical Spine With Flex & Extend    Patient presents with axial pain with possible radicular component.  Please evaluate for any evidence of cervical spine instability. Describe the presence of any spondylolisthesis (Antero- or retrolisthesis). If present, provide displacement Grade and measurement in cm. Please describe presence and specific location (Level & Laterality) of any signs of  osteoarthritis, zygapophyseal (Facet) joints DJD (including decreased joint space and/or osteophytosis), DDD, Foraminal narrowing, as well as any sclerosis and/or cyst formation. Please comment on ROM. Patient presents with axial pain with possible radicular component. Please assist us  in identifying specific level(s) and laterality of any additional findings such as: 1. Facet (Zygapophyseal) joint DJD (Hypertrophy, space narrowing, subchondral sclerosis, and/or osteophyte formation) 2. DDD and/or IVDD (Loss of disc height, desiccation, gas patterns, osteophytes, endplate sclerosis, or Black disc disease) 3. Pars defects 4. Spondylolisthesis, spondylosis, and/or spondyloarthropathies (include Degree/Grade of displacement in mm) (stability) 5. Vertebral body Fractures (acute/chronic) (state percentage of  collapse) 6. Demineralization (osteopenia/osteoporotic) 7. Bone pathology 8. Foraminal narrowing  9. Surgical changes    Standing Status:   Future    Number of Occurrences:   1    Expected Date:   05/24/2024    Expiration Date:   08/24/2024    Scheduling Instructions:     Please make sure that the patient understands that this needs to be done as soon as possible. Never have the patient do the imaging just before the next appointment. Inform patient that having the imaging done within the [facility] will expedite the availability of the results and will provide      imaging availability to the requesting physician. In addition inform the patient that the imaging order has an expiration date and will not be renewed if not done within the active period.    Reason for Exam (SYMPTOM  OR DIAGNOSIS REQUIRED):   Cervicalgia, chronic neck pain    Preferred imaging location?:   [facility]    Call Results- Best Contact Number?:   [phone] [facility]    Radiology Contrast Protocol - do NOT remove file path:   [email or web address]    Release to patient:   Immediate        Return in about 3 months (around 08/24/2024) for (F2F), (MM), Judy Blanch NP.    Recent Visits Date Type Provider Dept  02/28/24 Office Visit Enzio Buchler K, NP [facility]  Showing recent visits within past 90 days and meeting all other requirements Today's Visits Date Type Provider Dept  05/24/24 Office Visit Blue Ruggerio K, NP [facility]  Showing today's visits and meeting all other requirements Future Appointments Date Type Provider Dept  08/15/24 Appointment Glorimar Stroope K, NP [facility]  Showing future appointments within next 90 days and meeting all other requirements  I discussed the assessment and treatment plan with the patient. The patient was provided an opportunity to ask  questions and all were answered. The patient agreed with the plan and demonstrated an understanding of the instructions.  Patient advised to call back or seek an in-person evaluation if the symptoms or condition worsens.  I personally spent a total of 30 minutes in the care of the patient today including preparing to see the patient, getting/reviewing separately obtained history, performing a medically appropriate exam/evaluation, counseling and educating, placing orders, referring and communicating with other health care professionals, documenting clinical information in the EHR, independently interpreting results, communicating results, and coordinating care.   Note by: Shania Bjelland K Trevaun Rendleman, NP (TTS and AI technology used. I apologize for any typographical errors that were not detected and corrected.) Date: 05/24/2024; Time: 10:50 AM

## 2024-05-28 DIAGNOSIS — H353133 Nonexudative age-related macular degeneration, bilateral, advanced atrophic without subfoveal involvement: Secondary | ICD-10-CM | POA: Diagnosis not present

## 2024-05-29 ENCOUNTER — Ambulatory Visit: Admitting: General Surgery

## 2024-05-30 ENCOUNTER — Ambulatory Visit
Admission: RE | Admit: 2024-05-30 | Discharge: 2024-05-30 | Disposition: A | Discharge status: Home / Self Care | Source: Ambulatory Visit | Attending: Oncology | Admitting: Oncology

## 2024-05-30 ENCOUNTER — Other Ambulatory Visit: Payer: Self-pay | Admitting: *Deleted

## 2024-05-30 DIAGNOSIS — Z79811 Long term (current) use of aromatase inhibitors: Secondary | ICD-10-CM | POA: Insufficient documentation

## 2024-05-30 DIAGNOSIS — R92323 Mammographic fibroglandular density, bilateral breasts: Secondary | ICD-10-CM | POA: Diagnosis not present

## 2024-05-30 DIAGNOSIS — C50919 Malignant neoplasm of unspecified site of unspecified female breast: Secondary | ICD-10-CM

## 2024-05-30 DIAGNOSIS — C50912 Malignant neoplasm of unspecified site of left female breast: Secondary | ICD-10-CM | POA: Diagnosis not present

## 2024-05-30 DIAGNOSIS — C210 Malignant neoplasm of anus, unspecified: Secondary | ICD-10-CM

## 2024-05-30 DIAGNOSIS — R928 Other abnormal and inconclusive findings on diagnostic imaging of breast: Secondary | ICD-10-CM | POA: Diagnosis not present

## 2024-06-12 DIAGNOSIS — F4312 Post-traumatic stress disorder, chronic: Secondary | ICD-10-CM | POA: Diagnosis not present

## 2024-06-12 DIAGNOSIS — F411 Generalized anxiety disorder: Secondary | ICD-10-CM | POA: Diagnosis not present

## 2024-06-12 DIAGNOSIS — G894 Chronic pain syndrome: Secondary | ICD-10-CM | POA: Diagnosis not present

## 2024-06-12 DIAGNOSIS — F332 Major depressive disorder, recurrent severe without psychotic features: Secondary | ICD-10-CM | POA: Diagnosis not present

## 2024-06-12 DIAGNOSIS — G2570 Drug induced movement disorder, unspecified: Secondary | ICD-10-CM | POA: Diagnosis not present

## 2024-06-15 ENCOUNTER — Inpatient Hospital Stay (HOSPITAL_BASED_OUTPATIENT_CLINIC_OR_DEPARTMENT_OTHER): Admitting: Oncology

## 2024-06-15 ENCOUNTER — Encounter: Payer: Self-pay | Admitting: Oncology

## 2024-06-15 ENCOUNTER — Inpatient Hospital Stay: Attending: Oncology

## 2024-06-15 VITALS — BP 106/65 | HR 73 | Temp 97.0°F | Resp 18 | Wt 183.5 lb

## 2024-06-15 DIAGNOSIS — C50912 Malignant neoplasm of unspecified site of left female breast: Secondary | ICD-10-CM | POA: Diagnosis not present

## 2024-06-15 DIAGNOSIS — K7460 Unspecified cirrhosis of liver: Secondary | ICD-10-CM | POA: Diagnosis not present

## 2024-06-15 DIAGNOSIS — K7689 Other specified diseases of liver: Secondary | ICD-10-CM | POA: Diagnosis not present

## 2024-06-15 DIAGNOSIS — C50919 Malignant neoplasm of unspecified site of unspecified female breast: Secondary | ICD-10-CM

## 2024-06-15 DIAGNOSIS — Z79899 Other long term (current) drug therapy: Secondary | ICD-10-CM | POA: Diagnosis not present

## 2024-06-15 DIAGNOSIS — Z79811 Long term (current) use of aromatase inhibitors: Secondary | ICD-10-CM | POA: Diagnosis not present

## 2024-06-15 DIAGNOSIS — K769 Liver disease, unspecified: Secondary | ICD-10-CM | POA: Diagnosis not present

## 2024-06-15 DIAGNOSIS — Z85048 Personal history of other malignant neoplasm of rectum, rectosigmoid junction, and anus: Secondary | ICD-10-CM | POA: Diagnosis not present

## 2024-06-15 DIAGNOSIS — D649 Anemia, unspecified: Secondary | ICD-10-CM | POA: Diagnosis not present

## 2024-06-15 DIAGNOSIS — M8580 Other specified disorders of bone density and structure, unspecified site: Secondary | ICD-10-CM | POA: Diagnosis not present

## 2024-06-15 DIAGNOSIS — Z170 Estrogen receptor positive status [ER+]: Secondary | ICD-10-CM | POA: Diagnosis not present

## 2024-06-15 DIAGNOSIS — G4733 Obstructive sleep apnea (adult) (pediatric): Secondary | ICD-10-CM | POA: Diagnosis not present

## 2024-06-15 DIAGNOSIS — R932 Abnormal findings on diagnostic imaging of liver and biliary tract: Secondary | ICD-10-CM | POA: Diagnosis not present

## 2024-06-15 DIAGNOSIS — Z87891 Personal history of nicotine dependence: Secondary | ICD-10-CM | POA: Diagnosis not present

## 2024-06-15 DIAGNOSIS — C210 Malignant neoplasm of anus, unspecified: Secondary | ICD-10-CM | POA: Diagnosis not present

## 2024-06-15 DIAGNOSIS — Z800 Family history of malignant neoplasm of digestive organs: Secondary | ICD-10-CM | POA: Diagnosis not present

## 2024-06-15 DIAGNOSIS — I272 Pulmonary hypertension, unspecified: Secondary | ICD-10-CM | POA: Diagnosis not present

## 2024-06-15 LAB — CBC WITH DIFFERENTIAL (CANCER CENTER ONLY)
Abs Immature Granulocytes: 0.03 K/uL (ref 0.00–0.07)
Basophils Absolute: 0.1 K/uL (ref 0.0–0.1)
Basophils Relative: 1 %
Eosinophils Absolute: 0.3 K/uL (ref 0.0–0.5)
Eosinophils Relative: 3 %
HCT: 36.8 % (ref 36.0–46.0)
Hemoglobin: 12.1 g/dL (ref 12.0–15.0)
Immature Granulocytes: 0 %
Lymphocytes Relative: 21 %
Lymphs Abs: 1.8 K/uL (ref 0.7–4.0)
MCH: 29.2 pg (ref 26.0–34.0)
MCHC: 32.9 g/dL (ref 30.0–36.0)
MCV: 88.7 fL (ref 80.0–100.0)
Monocytes Absolute: 0.6 K/uL (ref 0.1–1.0)
Monocytes Relative: 7 %
Neutro Abs: 5.7 K/uL (ref 1.7–7.7)
Neutrophils Relative %: 68 %
Platelet Count: 204 K/uL (ref 150–400)
RBC: 4.15 MIL/uL (ref 3.87–5.11)
RDW: 15.1 % (ref 11.5–15.5)
WBC Count: 8.5 K/uL (ref 4.0–10.5)
nRBC: 0 % (ref 0.0–0.2)

## 2024-06-15 LAB — CMP (CANCER CENTER ONLY)
ALT: 34 U/L (ref 0–44)
AST: 30 U/L (ref 15–41)
Albumin: 4.6 g/dL (ref 3.5–5.0)
Alkaline Phosphatase: 87 U/L (ref 38–126)
Anion gap: 9 (ref 5–15)
BUN: 18 mg/dL (ref 8–23)
CO2: 27 mmol/L (ref 22–32)
Calcium: 9.4 mg/dL (ref 8.9–10.3)
Chloride: 102 mmol/L (ref 98–111)
Creatinine: 0.84 mg/dL (ref 0.44–1.00)
GFR, Estimated: >60 mL/min (ref >60)
Glucose, Bld: 120 mg/dL — ABNORMAL HIGH (ref 70–99)
Potassium: 4.1 mmol/L (ref 3.5–5.1)
Sodium: 138 mmol/L (ref 135–145)
Total Bilirubin: 0.7 mg/dL (ref 0.0–1.2)
Total Protein: 8.1 g/dL (ref 6.5–8.1)

## 2024-06-15 MED ORDER — ANASTROZOLE 1 MG PO TABS: 1.0000 mg | ORAL_TABLET | Freq: Every day | ORAL | 1 refills | Status: AC

## 2024-06-15 NOTE — Assessment & Plan Note (Addendum)
 2Left breast invasive mammary carcinoma pT1b pN0, s/p lumpectomy with sentinel lymph node biopsy.  Oncotype DX 8, Distant recurrence at 9 years 3%, chemotherapy benefit <1%, no need for adjuvant chemo.  [facility] -recommend to omit  adjuvant radiation due to her lung disease.  She tolerates Arimidex  1mg  daily [since 08/13/2022] . Refills were sent.   Annual mammogram bilaterally- Oct 2025 mammogram results were reviewed.

## 2024-06-15 NOTE — Assessment & Plan Note (Addendum)
 Completed chemotherapy radiation on 05/21/16  observation

## 2024-06-15 NOTE — Assessment & Plan Note (Signed)
Hemoglobin stable.  Monitor. 

## 2024-06-15 NOTE — Progress Notes (Signed)
 [facility]/Oncology Progress note [phone] [phone]     CHIEF COMPLAINTS/PURPOSE OF CONSULTATION:  Left breast invasive cancer   ASSESSMENT & PLAN:   Invasive carcinoma of breast (HCC) 2Left breast invasive mammary carcinoma pT1b pN0, s/p lumpectomy with sentinel lymph node biopsy.  Oncotype DX 8, Distant recurrence at 9 years 3%, chemotherapy benefit <1%, no need for adjuvant chemo.  [facility] -recommend to omit  adjuvant radiation due to her lung disease.  She tolerates Arimidex  1mg  daily [since 08/13/2022] . Refills were sent.   Annual mammogram bilaterally- Oct 2025 mammogram results were reviewed.  Anal squamous cell carcinoma (HCC) Completed chemotherapy radiation on 05/21/16  observation   Liver cirrhosis (HCC) CT images and ultrasound findings were reviewed and discussed with patient. She follows up with [facility] gastroenterology  Normocytic anemia Hemoglobin stable.  Monitor.  Osteopenia 11/25/2022 DEXA osteopenia FRAX major osteoporotic fracture is 9.1% within the next ten years. Recommend calcium  and Vitamin D  supplementation.    Orders Placed This Encounter  Procedures   DG Bone Density    Standing Status:   Future    Expected Date:   11/13/2024    Expiration Date:   06/15/2025    Reason for Exam (SYMPTOM  OR DIAGNOSIS REQUIRED):   hx breast cancer    Preferred imaging location?:   [facility]   CMP (Cancer Center only)    Standing Status:   Future    Expected Date:   12/13/2024    Expiration Date:   03/13/2025   CBC with Differential (Cancer Center Only)    Standing Status:   Future    Expected Date:   12/13/2024    Expiration Date:   03/13/2025   Follow up in 6 months . All questions were answered. The patient knows to call the clinic with any problems, questions or concerns.  Zelphia Cap, MD, PhD [facility] Hematology Oncology 06/15/2024        HISTORY OF PRESENTING ILLNESS:  Judy Allen 68 y.o. female  presents to establish care for left breast invasive cancer.  I have reviewed her chart and materials related to her cancer extensively and collaborated history with the patient. Summary of oncologic history is as follows: Oncology History  Anal squamous cell carcinoma (HCC)  03/09/2016 Initial Diagnosis   Anal squamous cell carcinoma   -Stage IIIb Squamous Cell Carcinoma of the Anus: Initially cT2 N2 M0 with left inguinal node involvement. Notably was HIV negative. Completed definitive chemoradiation with concurrent 5FU/mitomycin 05/21/16, for curative intent.     Invasive carcinoma of breast (HCC)  05/27/2022 Mammogram   In the left breast, a possible mass warrants further evaluation. In the right breast, no findings suspicious for malignancy. Further evaluation is suggested for a possible mass in the left breast.   06/16/2022 Mammogram   Unilateral left diagnostic mammogram  There is an indeterminate 7 mm mass in the LEFT breast at 8 o'clock 5 cm from the nipple which is favored to correspond to the site of screening mammographic concern. Recommend ultrasound-guided biopsy for definitive characterization with attention on post marker placement mammogram to assess for mammographic/sonographic correlation.   07/05/2022 Initial Diagnosis   Invasive carcinoma of breast (HCC)  -left breast lesion core biopsy Showed invasive mammary carcinoma, no special type.  Grade 1, DCIS not identified.  LVI not identified, ER/PR 90% positive, HER2 equivocal IHC 2+, HER2 FISH negative.    07/09/2022 Cancer Staging   Staging form: Breast, AJCC 8th Edition - Clinical  stage from 07/09/2022: Stage IA (cT1b, cN0, cM0, G1, ER+, PR+, HER2-) - Signed by Babara Call, MD on 07/15/2022 Stage prefix: Initial diagnosis Histologic grading system: 3 grade system   07/26/2022 Surgery   Patient underwent left breast lumpectomy and SLNB  invasive mammary carcinoma, no special type.  Grade 1, DCIS+ low grade, all 3  sentinel lymph nodes negative.  All margins negative. pT1b pN0   07/26/2022 Oncotype testing   Oncotype Dx 8, Distant recurrence at 9 years 3%, chemotherapy benefit <1%   07/26/2022 Cancer Staging   Staging form: Breast, AJCC 8th Edition - Pathologic stage from 07/26/2022: Stage IA (pT1b, pN0, cM0, G1, ER+, PR+, HER2-, Oncotype DX score: 8) - Signed by Babara Call, MD on 08/13/2022 Stage prefix: Initial diagnosis Multigene prognostic tests performed: Oncotype DX Recurrence score range: Less than 11 Histologic grading system: 3 grade system    Genetic Testing   Negative genetic testing. No pathogenic variants identified on the [facility] Common Hereditary Cancers+RNA panel. VUS in POLE called c.623C>G identified. The report date is 08/18/2022.  The Common Hereditary Cancers Panel + RNA offered by [facility] includes sequencing and/or deletion duplication testing of the following 48 genes: APC*, ATM*, AXIN2, BAP1, BARD1, BMPR1A, BRCA1, BRCA2, BRIP1, CDH1, CDK4, CDKN2A (p14ARF), CDKN2A (p16INK4a), CHEK2, CTNNA1, DICER1*, EPCAM*, FH*, GREM1*, HOXB13, KIT, MBD4, MEN1*, MLH1*, MSH2*, MSH3*, MSH6*, MUTYH, NF1*, NTHL1, PALB2, PDGFRA, PMS2*, POLD1*, POLE, PTEN*, RAD51C, RAD51D, SDHA*, SDHB, SDHC*, SDHD, SMAD4, SMARCA4, STK11, TP53, TSC1*, TSC2, VHL.     Menarche 68 years of age Patient has 3 children. Age at first childbirth 29 years old, Previous OCP use less than 5 years Postmenopausal, last menstrual period July 2003-age of 45, Patient has had a hysterectomy. She has a history of brief hormone replacement therapy, 2 weeks, discontinued due to intolerance. No previous breast biopsies.  Family history positive for maternal aunt with breast cancer.  Father with liver cancer. Patient has history of chronic respiratory failure due to pulmonary hypertension.  Patient is on nasal cannula oxygen .  She follows up with [facility] pulmonology  12/16/2023 CT abdomen pelvis with contrast showed  hepatosplenomegaly. 12/21/2023, right upper quadrant ultrasound showed hepatomegaly with increased echotexture with mildly nodular contour consistent with cirrhosis.  Results has been faxed to [facility] gastroenterology.   INTERVAL HISTORY Judy Allen is a 68 y.o. female who has above history reviewed by me today presents for follow up visit for Stage 1 left breast cancer.  She tolerates Arimidex  1mg  dialy. Manageable side effects. Denies hot flash Patient is on nasal cannula oxygen , 3 L She follows up with [facility] for liver cirrhosis   MEDICAL HISTORY:  Past Medical History:  Diagnosis Date   Allergy    Anal squamous cell carcinoma (HCC) 03/10/2016   a.) clinical stage IIIB (T2, N2, M0); Tx'd with chemotherapy + XRT   Anemia    Anxiety    Aortic atherosclerosis    Arthritis    Breast cancer, left (HCC) 07/10/2022   Cataract 09/2021   They're small   Complication of anesthesia    a.) PONV   Constipation    COPD (chronic obstructive pulmonary disease) (HCC)    Coronary artery calcification seen on CT scan    Depression    Diastolic dysfunction 06/08/2021   a.) TTE 06/08/2021: EF 55-60%; mild MR; PASP 47 mmHg; G1DD.   Emphysema of lung (HCC)    GERD (gastroesophageal reflux disease)    Goals of care, counseling/discussion 07/10/2022   H/O allergic rhinitis  Heart murmur    Hemorrhoid    History of fusion of cervical spine    a.) s/p ACDF C4-C7   History of kidney stones    Hyperlipidemia    Hyperparathyroidism    Hypertension    Macular degeneration    Methemoglobinemia 09/07/2016   Neck pain, chronic    Neuritis    OSA on CPAP 09/12/2016   Ovarian failure    Oxygen  deficiency    Oxygen  dependent    a.) 2 L/[place] (day) with increase to 4 L/[place] (night)   Personal history of chemotherapy    Personal history of radiation therapy    Pneumonitis 2017   a.) related to MITOCYCIN administration for squamous cell anal cancer   PONV (postoperative nausea and vomiting)     Proteinuria    Psoriasis    Pulmonary HTN (HCC) 06/22/2005   a.) TTE 06/22/2005: EF 61%; RVSP 31. b.) TTE 12/18/2009: EF 57%; RVSP 19.9. c.) TTE 05/20/2016: EF >55%; PASP 43. d.) TTE 04/07/2017: EF >55%; PASP 26. e.) TTE 03/12/2018: EF >55%; PASP 26. f.) TTE 06/08/2021: EF 55-60%; PASP 47.   Sleep apnea    Steatohepatitis    a.) grade II (moderate) on 09/2017 Bx   T2DM (type 2 diabetes mellitus) (HCC)    Tachycardia, paroxysmal (HCC)    Urinary incontinence     SURGICAL HISTORY: Past Surgical History:  Procedure Laterality Date   ABDOMINAL HYSTERECTOMY  Dec 28, 2021   ANTERIOR AND POSTERIOR REPAIR N/A 12/28/2021   Procedure: ANTERIOR (CYSTOCELE) AND POSTERIOR REPAIR (RECTOCELE);  Surgeon: Connell Davies, MD;  Location: [facility];  Service: Gynecology;  Laterality: N/A;   ANTERIOR CERVICAL DECOMP/DISCECTOMY FUSION N/A 2001   PROCEDURE: ACDF C4-C7   ANTERIOR FUSION LUMBAR SPINE  1990   plate in back, not metal   BREAST BIOPSY Left 07/05/2022   US  Bx, Ribbon Clip, Path Pending   BREAST BIOPSY Left 07/05/2022   US  LT BREAST BX W LOC DEV 1ST LESION IMG BX SPEC US  GUIDE 07/05/2022 [facility]   BREAST BIOPSY Left 07/22/2022   US  LT RADIO FREQUENCY TAG LOC US  GUIDE 07/22/2022 [facility]   BREAST BIOPSY Left 06/08/2023   u/s bx 2:00 10 cmfn heart clip, path pending   BREAST BIOPSY Left 06/08/2023   US  LT BREAST BX W LOC DEV 1ST LESION IMG BX SPEC US  GUIDE 06/08/2023 [facility]   BREAST SURGERY  07/06/2022   This was a biopsy   CARDIAC CATHETERIZATION  2009?    [facility]   CARDIAC CATHETERIZATION  05/2012   [facility]: Mild hypertension 37/12 with a mean pressure of 21 mm mercury   CHOLECYSTECTOMY N/A 09/12/2017   Procedure: LAPAROSCOPIC CHOLECYSTECTOMY WITH INTRAOPERATIVE CHOLANGIOGRAM;  Surgeon: Dessa Reyes ORN, MD;  Location: [facility];  Service: General;  Laterality: N/A;   COLONOSCOPY  2018   LIVER BIOPSY N/A 09/12/2017   Procedure: LIVER BIOPSY;  Surgeon: Dessa Reyes ORN, MD;  Location: [facility];  Service: General;  Laterality: N/A;   MASS EXCISION Left 02/23/2016   Procedure: EXCISION MASS;  Surgeon: Louanne KANDICE Muse, MD;  Location: [facility];  Service: General;  Laterality: Left;   PART MASTECTOMY,RADIO FREQUENCY LOCALIZER,AXILLARY SENTINEL NODE BIOPSY Left 07/26/2022   INVASIVE MAMMARY CARCINOMA, NO SPECIAL TYPE.  - DUCTAL CARCINOMA IN SITU, LOW GRADE margins 6mm closest anterior  Rodolph Romano   [facility] REMOVAL  2018   PORTACATH PLACEMENT Left 03/12/2016   Procedure: INSERTION PORT-A-CATH;  Surgeon: Louanne KANDICE Muse, MD;  Location: [facility];  Service:  General;  Laterality: Left;   SPINE SURGERY  1995 I believe   TONSILLECTOMY Bilateral 1959   TRIGGER FINGER RELEASE Right 09/2018   Dr. Cleotilde    TUBAL LIGATION     s/p   VAGINAL HYSTERECTOMY N/A 12/28/2021   Procedure: HYSTERECTOMY VAGINAL;  Surgeon: Connell Davies, MD;  Location: [facility];  Service: Gynecology;  Laterality: N/A;    SOCIAL HISTORY: Social History   Socioeconomic History   Marital status: Widowed    Spouse name: Not on file   Number of children: 3   Years of education: Not on file   Highest education level: 11th grade  Occupational History   Not on file  Tobacco Use   Smoking status: Former    Current packs/day: 0.00    Average packs/day: 1 pack/day for 42.4 years (42.4 ttl pk-yrs)    Types: Cigarettes    Start date: 08/10/1963    Quit date: 01/15/2006    Years since quitting: 18.4   Smokeless tobacco: Never  Vaping Use   Vaping status: Never Used  Substance and Sexual Activity   Alcohol use: No   Drug use: No   Sexual activity: Not Currently    Birth control/protection: Surgical  Other Topics Concern   Not on file  Social History Narrative   Husband passed away on Jan 06, 2018   She was living with Alan for few years, but moved in with Candace her oldest daughter this past Summer.    Social Drivers of Corporate Investment Banker  Strain: Low Risk  (05/04/2024)   Overall Financial Resource Strain (CARDIA)    Difficulty of Paying Living Expenses: Not very hard  Food Insecurity: No Food Insecurity (05/04/2024)   Hunger Vital Sign    Worried About Running Out of Food in the Last Year: Never true    Ran Out of Food in the Last Year: Never true  Transportation Needs: No Transportation Needs (05/04/2024)   PRAPARE - Administrator, Civil Service (Medical): No    Lack of Transportation (Non-Medical): No  Physical Activity: Inactive (05/04/2024)   Exercise Vital Sign    Days of Exercise per Week: 0 days    Minutes of Exercise per Session: Not on file  Stress: Stress Concern Present (05/04/2024)   [employer] of Occupational Health - Occupational Stress Questionnaire    Feeling of Stress: To some extent  Social Connections: Socially Isolated (05/04/2024)   Social Connection and Isolation Panel    Frequency of Communication with Friends and Family: More than three times a week    Frequency of Social Gatherings with Friends and Family: Once a week    Attends Religious Services: Never    Database Administrator or Organizations: No    Attends Engineer, Structural: Not on file    Marital Status: Widowed  Intimate Partner Violence: Not At Risk (04/27/2024)   Received from [facility]   Humiliation, Afraid, Rape, and Kick questionnaire    Within the last year, have you been afraid of your partner or ex-partner?: No    Within the last year, have you been humiliated or emotionally abused in other ways by your partner or ex-partner?: No    Within the last year, have you been kicked, hit, slapped, or otherwise physically hurt by your partner or ex-partner?: No    Within the last year, have you been raped or forced to have any kind of sexual activity by your partner or ex-partner?:  No    FAMILY HISTORY: Family History  Problem Relation Age of Onset   Heart attack Mother    Asthma Mother    COPD Mother     Varicose Veins Mother    Liver cancer Father 49   Cancer Father    Cancer Brother        unk type; metastatic   Breast cancer Maternal Aunt        dx 20s-30s   Breast cancer Cousin        dx 53s   Bladder Cancer Neg Hx    Kidney cancer Neg Hx    Colon cancer Neg Hx     ALLERGIES:  is allergic to aczone [dapsone], abilify  [aripiprazole ], atarax  [hydroxyzine ], hydrochlorothiazide , sulfa antibiotics, and sulfasalazine.  MEDICATIONS:  Current Outpatient Medications  Medication Sig Dispense Refill   albuterol  (PROVENTIL  HFA;VENTOLIN  HFA) 108 (90 Base) MCG/ACT inhaler Inhale 2 puffs into the lungs every 6 (six) hours as needed for wheezing or shortness of breath.     Cholecalciferol  (VITAMIN D3) 1000 units CAPS Take 1 capsule by mouth daily.     clobetasol  cream (TEMOVATE ) 0.05 % Apply topically.     cyclobenzaprine  (FLEXERIL ) 5 MG tablet Take 1 tablet (5 mg total) by mouth 3 (three) times daily as needed for muscle spasms. 90 tablet 5   desonide  (DESOWEN ) 0.05 % cream Apply twice daily to affected areas on the face and skin folds (groin and under breasts).     gabapentin  (NEURONTIN ) 400 MG capsule Take 1 capsule (400 mg total) by mouth 3 (three) times daily. 90 capsule 5   HYDROcodone  Bitartrate ER 10 MG CP12 Take 10 mg by mouth every 12 (twelve) hours. Must last 30 days. 60 capsule 0   metoprolol  succinate (TOPROL -XL) 50 MG 24 hr tablet Take 1 tablet by mouth daily.     Multiple Vitamins-Minerals (EYE VITAMINS & MINERALS PO) Take 1 capsule by mouth in the morning. [employer] Eye Vitamins     NIFEdipine  (PROCARDIA -XL/NIFEDICAL-XL) 30 MG 24 hr tablet Take 30 mg by mouth at bedtime.     omega-3 acid ethyl esters (LOVAZA ) 1 g capsule TAKE 2 CAPSULES BY MOUTH TWO TIMES A DAY 360 capsule 1   omeprazole  (PRILOSEC) 40 MG capsule Take 1 capsule (40 mg total) by mouth daily. 90 capsule 1   OXYGEN  Inhale 2-4 L into the lungs continuous. 2 L qd and 4 L qhs     Potassium Gluconate 80 MG TABS Take  1 tablet by mouth daily.     risankizumab-rzaa (SKYRIZI) 150 MG/ML SOSY prefilled syringe Inject 1 mL into the skin every 3 (three) months.     rosuvastatin  (CRESTOR ) 10 MG tablet Take 1 tablet (10 mg total) by mouth daily. 90 tablet 1   sertraline (ZOLOFT) 100 MG tablet Take 100 mg by mouth daily.     telmisartan  (MICARDIS ) 80 MG tablet Take 80 mg by mouth daily.     torsemide  (DEMADEX ) 5 MG tablet Take 5 mg by mouth every morning.     umeclidinium-vilanterol (ANORO ELLIPTA ) 62.5-25 MCG/ACT AEPB Inhale 1 puff into the lungs in the morning.     valbenazine  (INGREZZA ) 80 MG capsule Take 80 mg by mouth daily.     venlafaxine  XR (EFFEXOR -XR) 75 MG 24 hr capsule Take 1 capsule (75 mg total) by mouth 3 (three) times daily. 270 capsule 1   anastrozole  (ARIMIDEX ) 1 MG tablet Take 1 tablet (1 mg total) by mouth daily. 90 tablet 1   [START ON  07/07/2024] HYDROcodone  Bitartrate ER 10 MG CP12 Take 10 mg by mouth every 12 (twelve) hours. Must last 30 days. (Patient not taking: Reported on 06/15/2024) 60 capsule 0   [START ON 08/06/2024] HYDROcodone  Bitartrate ER 10 MG CP12 Take 10 mg by mouth every 12 (twelve) hours. Must last 30 days. (Patient not taking: Reported on 06/15/2024) 60 capsule 0   No current facility-administered medications for this visit.    Review of Systems  Constitutional:  Negative for appetite change, chills, fatigue and fever.  HENT:   Negative for hearing loss and voice change.   Eyes:  Negative for eye problems.  Respiratory:  Positive for shortness of breath. Negative for chest tightness and cough.   Cardiovascular:  Negative for chest pain.  Gastrointestinal:  Negative for abdominal distention, abdominal pain and blood in stool.  Endocrine: Negative for hot flashes.  Genitourinary:  Negative for difficulty urinating and frequency.   Musculoskeletal:  Negative for arthralgias.  Skin:  Negative for itching and rash.  Neurological:  Negative for extremity weakness.   Hematological:  Negative for adenopathy.  Psychiatric/Behavioral:  Negative for confusion.    SABRA    PHYSICAL EXAMINATION: ECOG PERFORMANCE STATUS: 1 - Symptomatic but completely ambulatory  Vitals:   06/15/24 1144  BP: 106/65  Pulse: 73  Resp: 18  Temp: (!) 97 F (36.1 C)   Filed Weights   06/15/24 1144  Weight: 183 lb 8 oz (83.2 kg)    Physical Exam Constitutional:      General: She is not in acute distress.    Appearance: She is not diaphoretic.  HENT:     Head: Normocephalic and atraumatic.  Eyes:     General: No scleral icterus. Cardiovascular:     Rate and Rhythm: Normal rate and regular rhythm.  Pulmonary:     Effort: Pulmonary effort is normal. No respiratory distress.     Comments: On nasal cannula oxygen .  Abdominal:     General: There is no distension.     Palpations: Abdomen is soft.     Tenderness: There is no abdominal tenderness.  Musculoskeletal:        General: Normal range of motion.     Cervical back: Normal range of motion and neck supple.  Skin:    General: Skin is warm and dry.     Findings: No erythema.  Neurological:     Mental Status: She is alert and oriented to person, place, and time.     Cranial Nerves: No cranial nerve deficit.     Motor: No abnormal muscle tone.     Coordination: Coordination normal.  Psychiatric:        Mood and Affect: Affect normal.      LABORATORY DATA:  I have reviewed the data as listed    Latest Ref Rng & Units 06/15/2024   11:15 AM 12/07/2023    1:58 PM 06/01/2023    9:33 AM  CBC  WBC 4.0 - 10.5 K/uL 8.5  9.5  11.2   Hemoglobin 12.0 - 15.0 g/dL 87.8  88.6  88.5   Hematocrit 36.0 - 46.0 % 36.8  35.6  36.0   Platelets 150 - 400 K/uL 204  260  310       Latest Ref Rng & Units 06/15/2024   11:15 AM 05/07/2024    8:41 AM 12/07/2023    1:58 PM  CMP  Glucose 70 - 99 mg/dL 879  886  896   BUN 8 - 23 mg/dL 18  13  16   Creatinine 0.44 - 1.00 mg/dL 9.15  9.20  9.19   Sodium 135 - 145 mmol/L 138  142   139   Potassium 3.5 - 5.1 mmol/L 4.1  4.2  3.7   Chloride 98 - 111 mmol/L 102  101  101   CO2 22 - 32 mmol/L 27  31  28    Calcium  8.9 - 10.3 mg/dL 9.4  89.7    89.7  9.4   Total Protein 6.5 - 8.1 g/dL 8.1  8.1  8.3   Total Bilirubin 0.0 - 1.2 mg/dL 0.7  0.4  0.6   Alkaline Phos 38 - 126 U/L 87   136   AST 15 - 41 U/L 30  25  63   ALT 0 - 44 U/L 34  29  50      RADIOGRAPHIC STUDIES: I have personally reviewed the radiological images as listed and agreed with the findings in the report. MM 3D DIAGNOSTIC MAMMOGRAM BILATERAL BREAST Result Date: 05/30/2024 CLINICAL DATA:  LEFT breast lumpectomy in December 2023. No radiation. On anastrozole . No complaints today. EXAM: DIGITAL DIAGNOSTIC BILATERAL MAMMOGRAM WITH TOMOSYNTHESIS AND CAD TECHNIQUE: Bilateral digital diagnostic mammography and breast tomosynthesis was performed. The images were evaluated with computer-aided detection. COMPARISON:  Previous exam(s). ACR Breast Density Category b: There are scattered areas of fibroglandular density. FINDINGS: There is density and architectural distortion within the LEFT breast, corresponding to the site of prior lumpectomy. Spot compression magnification view(s) demonstrate no evidence of recurrent malignancy. Findings are stable compared to prior. There is no mammographic evidence of malignancy in EITHER breast. IMPRESSION: No mammographic evidence of malignancy in EITHER breast. RECOMMENDATION: Diagnostic mammogram of the BILATERAL breasts in 1 year. I have discussed the findings and recommendations with the patient. If applicable, a reminder letter will be sent to the patient regarding the next appointment. BI-RADS CATEGORY  2: Benign. Electronically Signed   By: Norleen Croak M.D.   On: 05/30/2024 15:05   DG Cervical Spine With Flex & Extend Result Date: 05/29/2024 CLINICAL DATA:  Chronic neck pain EXAM: CERVICAL SPINE COMPLETE WITH FLEXION AND EXTENSION VIEWS COMPARISON:  03/03/2013 FINDINGS: There  again noted postsurgical changes at C4-5, C5-6 and C6-7. Hardware appears stable. No failure is identified. No significant neural foraminal narrowing is noted. Facet hypertrophic changes are noted. Flexion and extension views show some very minimal anterolisthesis of C2 on C3 on flexion. No instability on extension is seen. The odontoid is within normal limits. IMPRESSION: Degenerative and postsurgical changes. Mild anterolisthesis of C2 on C3 on flexion films. Electronically Signed   By: Oneil Devonshire M.D.   On: 05/29/2024 00:02

## 2024-06-15 NOTE — Assessment & Plan Note (Signed)
11/25/2022 DEXA osteopenia FRAX major osteoporotic fracture is 9.1% within the next ten years. Recommend calcium and Vitamin D supplementation.

## 2024-06-15 NOTE — Assessment & Plan Note (Signed)
 CT images and ultrasound findings were reviewed and discussed with patient. She follows up with [facility] gastroenterology

## 2024-06-25 DIAGNOSIS — L409 Psoriasis, unspecified: Secondary | ICD-10-CM | POA: Diagnosis not present

## 2024-06-25 DIAGNOSIS — Z79899 Other long term (current) drug therapy: Secondary | ICD-10-CM | POA: Diagnosis not present

## 2024-06-27 DIAGNOSIS — L918 Other hypertrophic disorders of the skin: Secondary | ICD-10-CM | POA: Diagnosis not present

## 2024-07-04 DIAGNOSIS — K219 Gastro-esophageal reflux disease without esophagitis: Secondary | ICD-10-CM | POA: Diagnosis not present

## 2024-07-04 DIAGNOSIS — I10 Essential (primary) hypertension: Secondary | ICD-10-CM | POA: Diagnosis not present

## 2024-07-04 DIAGNOSIS — Z01818 Encounter for other preprocedural examination: Secondary | ICD-10-CM | POA: Diagnosis not present

## 2024-07-04 DIAGNOSIS — E1122 Type 2 diabetes mellitus with diabetic chronic kidney disease: Secondary | ICD-10-CM | POA: Diagnosis not present

## 2024-07-04 DIAGNOSIS — K7460 Unspecified cirrhosis of liver: Secondary | ICD-10-CM | POA: Diagnosis not present

## 2024-07-04 DIAGNOSIS — G8929 Other chronic pain: Secondary | ICD-10-CM | POA: Diagnosis not present

## 2024-07-04 DIAGNOSIS — Z9981 Dependence on supplemental oxygen: Secondary | ICD-10-CM | POA: Diagnosis not present

## 2024-07-04 DIAGNOSIS — J9611 Chronic respiratory failure with hypoxia: Secondary | ICD-10-CM | POA: Diagnosis not present

## 2024-07-04 DIAGNOSIS — N182 Chronic kidney disease, stage 2 (mild): Secondary | ICD-10-CM | POA: Diagnosis not present

## 2024-07-07 DIAGNOSIS — E1122 Type 2 diabetes mellitus with diabetic chronic kidney disease: Secondary | ICD-10-CM | POA: Diagnosis not present

## 2024-07-07 DIAGNOSIS — I10 Essential (primary) hypertension: Secondary | ICD-10-CM | POA: Diagnosis not present

## 2024-07-07 DIAGNOSIS — N182 Chronic kidney disease, stage 2 (mild): Secondary | ICD-10-CM | POA: Diagnosis not present

## 2024-07-13 DIAGNOSIS — K219 Gastro-esophageal reflux disease without esophagitis: Secondary | ICD-10-CM | POA: Diagnosis not present

## 2024-07-13 DIAGNOSIS — E119 Type 2 diabetes mellitus without complications: Secondary | ICD-10-CM | POA: Diagnosis not present

## 2024-07-13 DIAGNOSIS — I499 Cardiac arrhythmia, unspecified: Secondary | ICD-10-CM | POA: Diagnosis not present

## 2024-07-13 DIAGNOSIS — J449 Chronic obstructive pulmonary disease, unspecified: Secondary | ICD-10-CM | POA: Diagnosis not present

## 2024-07-13 DIAGNOSIS — E785 Hyperlipidemia, unspecified: Secondary | ICD-10-CM | POA: Diagnosis not present

## 2024-07-13 DIAGNOSIS — L918 Other hypertrophic disorders of the skin: Secondary | ICD-10-CM | POA: Diagnosis not present

## 2024-07-13 DIAGNOSIS — R0989 Other specified symptoms and signs involving the circulatory and respiratory systems: Secondary | ICD-10-CM | POA: Diagnosis not present

## 2024-07-13 DIAGNOSIS — K7030 Alcoholic cirrhosis of liver without ascites: Secondary | ICD-10-CM | POA: Diagnosis not present

## 2024-07-13 DIAGNOSIS — I10 Essential (primary) hypertension: Secondary | ICD-10-CM | POA: Diagnosis not present

## 2024-07-13 DIAGNOSIS — M1990 Unspecified osteoarthritis, unspecified site: Secondary | ICD-10-CM | POA: Diagnosis not present

## 2024-07-13 DIAGNOSIS — I272 Pulmonary hypertension, unspecified: Secondary | ICD-10-CM | POA: Diagnosis not present

## 2024-07-13 DIAGNOSIS — N170 Acute kidney failure with tubular necrosis: Secondary | ICD-10-CM | POA: Diagnosis not present

## 2024-07-13 NOTE — Anesthesia Postprocedure Evaluation (Signed)
 Patient: Judy Allen  Scheduled: Procedures: ANORECTAL EXAM, SURGICAL, REQUIRING ANESTHESIA (GENERAL, SPINAL, OR EPIDURAL), DIAGNOSTIC EXCISION OF SINGLE EXTERNAL PAPILLA OR TAG, ANUS  Anesthesia Start Date/Time: 07/13/24 1827   Procedures:      ANORECTAL EXAM, SURGICAL, REQUIRING ANESTHESIA (GENERAL, SPINAL, OR  EPIDURAL), DIAGNOSTIC     EXCISION OF SINGLE EXTERNAL PAPILLA OR TAG, ANUS   Anesthesia type: general   Diagnosis: Skin tag [L91.8]   Pre-op diagnosis: Skin tag [L91.8]   Location: [facility] 3RD FLR OR 25 / OR [facility]   Surgeons: Bunnie Dayton Popp, MD     Final Anesthesia Type: general  Anesthesia Staff: Anesthesiologist: Lonne Dale Cough, MD Anesthesia Provider: Voncille Morene BROCKS, CRNA     No lines, drains, or airways are recorded for this episode.     Patient Location: [facility] 3RD [facility]  Last vitals:  Vitals Value Taken Time  BP 121/67 07/13/24 19:26  Temp 36.3 C (97.3 F) 07/13/24 19:26  Pulse 70 07/13/24 19:26  Resp 17 07/13/24 19:26  SpO2 90 % 07/13/24 19:26  SpO2 Pulse 76 07/13/24 19:26      Level of consciousness: alert and awake Post vitals: stable PONV Present? no Pain score: 0 Pain management: adequate Airway patency: patent Cardiovascular status: acceptable Respiratory status: acceptable Hydration status: acceptable    PACU PONV Meds: None  PACU Pain Meds: None  Anesthetic complications: There were no known notable events for this encounter.   _______________ Alfonso LITTIE Kirks, MD 07/13/24

## 2024-08-12 NOTE — Progress Notes (Deleted)
 PROVIDER NOTE: Interpretation of information contained herein should be left to medically-trained personnel. Specific patient instructions are provided elsewhere under Patient Instructions section of medical record. This document was created in part using AI and STT-dictation technology, any transcriptional errors that may result from this process are unintentional.  Patient: Judy Allen  Service: E/M   PCP: Sowles, Krichna, MD  DOB: Jan 08, 1956  DOS: 08/15/2024  Provider: Emmy MARLA Blanch, NP  MRN: [id number]  Delivery: Face-to-face  Specialty: Interventional Pain Management  Type: Established Patient  Setting: Ambulatory outpatient facility  Specialty designation: 09  Referring Prov.: Sowles, Krichna, MD  Location: Outpatient office facility       History of present illness (HPI) Judy Allen, a 70 y.o. year old female, is here today because of her No primary diagnosis found.. Judy Allen primary complain today is No chief complaint on file.  Pertinent problems: Judy Allen has Tachycardia, paroxysmal (HCC); Benign hypertension; Chronic cervical pain; Gastric reflux; Chronic recurrent major depressive disorder; Obstructive apnea; Bursitis, trochanteric; GAD (generalized anxiety disorder); History of fusion of cervical spine; Chronic constipation; Non-thrombocytopenic purpura; Anal squamous cell carcinoma (HCC); Centrilobular emphysema (HCC); Chronic respiratory failure with hypoxia, on home oxygen  therapy (HCC); Fatty liver; Perihepatitis (HCC); Myofascial pain syndrome, cervical; Chronic pain syndrome; Cervical facet joint syndrome; Cervical radiculopathy (left); Osteoarthritis of spine with radiculopathy, cervical region; Disorder of SI (sacroiliac) joint; Arthritis of sacroiliac joint of both sides; Chronic radicular lumbar pain; Bilateral hip pain; S/P vaginal hysterectomy; Breast cancer, left (HCC); Invasive carcinoma of breast (HCC); Breast cancer (HCC); Medication management; Osteopenia;  Pulmonary hypertension (HCC); and Liver cirrhosis (HCC) on their pertinent problem list.  Pain Assessment: Severity of   is reported as a  /10. Location:    / . Onset:  . Quality:  . Timing:  . Modifying factor(s):  SABRA Vitals:  vitals were not taken for this visit.  BMI: Estimated body mass index is 31.75 kg/m as calculated from the following:   Height as of 05/24/24: 5' 3.75 (1.619 m).   Weight as of 06/15/24: 183 lb 8 oz (83.2 kg).  Last encounter: 05/24/2024. Last procedure: Visit date not found.  Reason for encounter: {Blank single:19197::medication management,post-procedure evaluation and assessment,both, medication management and post-procedure evaluation and assessment,evaluation of worsening, or previously known (established) problem,patient-requested evaluation,follow-up evaluation,evaluation for possible interventional PM therapy/treatment,evaluation of new problem, *** }.   Discussed the use of AI scribe software for clinical note transcription with the patient, who gave verbal consent to proceed.  History of Present Illness           Pharmacotherapy Assessment   Hydrocodone  ER 10 Mg every 12 hours as needed for pain.  Monitoring: [place] PMP: PDMP reviewed during this encounter.       Pharmacotherapy: No side-effects or adverse reactions reported. Compliance: No problems identified. Effectiveness: Clinically acceptable.  [name], [place]  05/24/2024 10:00 AM  Sign when Signing Visit Nursing Pain Medication Assessment:  Safety precautions to be maintained throughout the outpatient stay will include: orient to surroundings, keep bed in low position, maintain call bell within reach at all times, provide assistance with transfer out of bed and ambulation.  Medication Inspection Compliance: Pill count conducted under aseptic conditions, in front of the patient. Neither the pills nor the bottle was removed from the patient's sight at any time. Once count was  completed pills were immediately returned to the patient in their original bottle.  Medication: Hydrocodone /APAP Pill/Patch Count: 29 of 60 pills/patches remain Pill/Patch Appearance:  Markings consistent with prescribed medication Bottle Appearance: Standard pharmacy container. Clearly labeled. Filled Date: 09 / 28 / 2025 Last Medication intake:  Today    UDS:  Summary  Date Value Ref Range Status  02/29/2024 FINAL  Final    Comment:    ==================================================================== [employer] (MW) ==================================================================== Test                             Result       Flag       Units  Drug Present   Hydrocodone                     3742                    ng/mg creat   Hydromorphone                   407                     ng/mg creat   Dihydrocodeine                 186                     ng/mg creat   Norhydrocodone                 2130                    ng/mg creat    Sources of hydrocodone  include scheduled prescription medications.    Hydromorphone , dihydrocodeine and norhydrocodone are expected    metabolites of hydrocodone . Hydromorphone  and dihydrocodeine are    also available as scheduled prescription medications.  ==================================================================== Test                      Result    Flag   Units      Ref Range   Creatinine              43               mg/dL      >=79 ==================================================================== Declared Medications:  Medication list was not provided. ==================================================================== For clinical consultation, please call [phone]. ====================================================================     No results found for: CBDTHCR No results found for: D8THCCBX No results found for: D9THCCBX  ROS  Constitutional: Denies any fever or chills Gastrointestinal: No  reported hemesis, hematochezia, vomiting, or acute GI distress Musculoskeletal: Neck pain radiate down bilateral arms and shoulders (L>R) Neurological: No reported episodes of acute onset apraxia, aphasia, dysarthria, agnosia, amnesia, paralysis, loss of coordination, or loss of consciousness  Medication Review  HYDROcodone  Bitartrate ER, Multiple Vitamins-Minerals, NIFEdipine , Oxygen -Helium, Potassium Gluconate, Vitamin D3, albuterol , anastrozole , clobetasol  cream, cyclobenzaprine , desonide , gabapentin , metoprolol  succinate, omega-3 acid ethyl esters, omeprazole , risankizumab-rzaa, rosuvastatin , sertraline, telmisartan, torsemide , umeclidinium-vilanterol, valbenazine , and venlafaxine  XR  History Review  Allergy: Judy Allen is allergic to aczone [dapsone], abilify  [aripiprazole ], atarax  [hydroxyzine ], hydrochlorothiazide , sulfa antibiotics, and sulfasalazine. Drug: Judy Allen  reports no history of drug use. Alcohol:  reports no history of alcohol use. Tobacco:  reports that she quit smoking about 18 years ago. Her smoking use included cigarettes. She started smoking about 60 years ago. She has a 42.4 pack-year smoking history. She has never used smokeless tobacco. Social: Judy Allen  reports that she quit smoking about 18 years ago.  Her smoking use included cigarettes. She started smoking about 60 years ago. She has a 42.4 pack-year smoking history. She has never used smokeless tobacco. She reports that she does not drink alcohol and does not use drugs. Medical:  has a past medical history of Allergy, Anal squamous cell carcinoma (HCC) (03/10/2016), Anemia, Anxiety, Aortic atherosclerosis, Arthritis, Breast cancer, left (HCC) (07/10/2022), Cataract (09/2021), Complication of anesthesia, Constipation, COPD (chronic obstructive pulmonary disease) (HCC), Coronary artery calcification seen on CT scan, Depression, Diastolic dysfunction (06/08/2021), Emphysema of lung (HCC), GERD (gastroesophageal reflux  disease), Goals of care, counseling/discussion (07/10/2022), H/O allergic rhinitis, Heart murmur, Hemorrhoid, History of fusion of cervical spine, History of kidney stones, Hyperlipidemia, Hyperparathyroidism, Hypertension, Macular degeneration, 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 Surgical: Ms. Serrao  has a past surgical history that includes Tubal ligation; Anterior fusion lumbar spine (1990); Mass excision (Left, 02/23/2016); Colonoscopy (2018); Portacath placement (Left, 03/12/2016); Cardiac catheterization (2009? ); Cardiac catheterization (05/2012); Port-a-cath removal (2018); Liver biopsy (N/A, 09/12/2017); Cholecystectomy (N/A, 09/12/2017); Trigger finger release (Right, 09/2018); Anterior cervical decomp/discectomy fusion (N/A, 2001); Tonsillectomy (Bilateral, 1959); Anterior and posterior repair (N/A, 12/28/2021); Vaginal hysterectomy (N/A, 12/28/2021); Breast biopsy (Left, 07/05/2022); Breast biopsy (Left, 07/05/2022); Breast biopsy (Left, 07/22/2022); Part mastectomy,radio frequency localizer,axillary sentinel node biopsy (Left, 07/26/2022); Breast biopsy (Left, 06/08/2023); Breast biopsy (Left, 06/08/2023); Abdominal hysterectomy (Dec 28, 2021); Breast surgery (07/06/2022); and Spine surgery (1995 I believe). Family: family history includes Asthma in her mother; Breast cancer in her cousin and maternal aunt; COPD in her mother; Cancer in her brother and father; Heart attack in her mother; Liver cancer (age of onset: 70) in her father; Varicose Veins in her mother.  Laboratory  Chemistry Profile   Renal Lab Results  Component Value Date   BUN 13 05/07/2024   CREATININE 0.79 05/07/2024   LABCREA 127 05/16/2023   BCR SEE NOTE: 05/07/2024   GFRAA 75 04/23/2019   GFRNONAA >60 12/07/2023    Hepatic Lab Results  Component Value Date   AST 25 05/07/2024   ALT 29 05/07/2024   ALBUMIN 4.4 12/07/2023   ALKPHOS 136 (H) 12/07/2023   HCVAB NON REACTIVE 12/07/2023   LIPASE 27 08/03/2017    Electrolytes Lab Results  Component Value Date   NA 142 05/07/2024   K 4.2 05/07/2024   CL 101 05/07/2024   CALCIUM  10.2 05/07/2024   CALCIUM  10.2 05/07/2024    Bone Lab Results  Component Value Date   VD25OH 57 07/22/2021    Inflammation (CRP: Acute Phase) (ESR: Chronic Phase) Lab Results  Component Value Date   CRP 1.6 (H) 12/21/2023   ESRSEDRATE 41 (H) 12/21/2023         Note: Above Lab results reviewed.  Recent Imaging Review  US  Abdomen Limited RUQ (LIVER/GB) CLINICAL DATA:  Splenomegaly, hepatomegaly.  EXAM: ULTRASOUND ABDOMEN LIMITED RIGHT UPPER QUADRANT  COMPARISON:  CT abdomen and pelvis Dec 15, 2023  FINDINGS: Gallbladder:  Surgically absent.  Common bile duct:  Diameter: 4.2 mm.  Liver:  No focal lesion. Increased echotexture with mild nodular contour. Right lobe liver measures at least 24 cm. Portal vein is patent on color Doppler imaging with normal direction of blood flow towards the liver.  Other: None.  IMPRESSION: Hepatomegaly with  increased echotexture and mild nodular contour, consistent with cirrhosis. No focal liver lesion identified.  Electronically Signed   By: Craig Farr M.D.   On: 12/29/2023 12:04 Note: Reviewed        Physical Exam  Vitals: BP (!) 108/36 (BP Location: Right Arm, Patient Position: Sitting, Cuff Size: Normal)   Pulse 82   Temp (!) 97 F (36.1 C) (Temporal)   Resp 18   Ht 5' 3.75 (1.619 m)   Wt 180 lb (81.6 kg)   LMP 03/04/2002 (Approximate)   BMI 31.14 kg/m  BMI: Estimated body mass  index is 31.14 kg/m as calculated from the following:   Height as of this encounter: 5' 3.75 (1.619 m).   Weight as of this encounter: 180 lb (81.6 kg). Ideal: Ideal body weight: 54.1 kg (119 lb 5.2 oz) Adjusted ideal body weight: 65.1 kg (143 lb 9.5 oz) General appearance: Well nourished, well developed, and well hydrated. In no apparent acute distress Mental status: Alert, oriented x 3 (person, place, & time)       Respiratory: No evidence of acute respiratory distress Eyes: PERLA Musculoskeletal: Neck pain radiated to bilateral shoulders and arms Cervical Spine Exam  Skin & Axial Inspection: No masses, redness, edema, swelling, or associated skin lesions Alignment: Symmetrical Functional ROM: Pain restricted ROM, bilaterally Stability: No instability detected Muscle Tone/Strength: Functionally intact. No obvious neuro-muscular anomalies detected. Sensory (Neurological): Musculoskeletal pain pattern Palpation: No palpable anomalies              Upper Extremity (UE) Exam      Right  Left  Inspection    Skin color, temperature, and hair growth are WNL. No peripheral edema or cyanosis. No masses, redness, swelling, asymmetry, or associated skin lesions. No contractures.  Skin color, temperature, and hair growth are WNL. No peripheral edema or cyanosis. No masses, redness, swelling, asymmetry, or associated skin lesions. No contractures.          Functional ROM    Pain restricted ROM          Pain restricted ROM                  Muscle Tone/Strength    Functionally intact. No obvious neuro-muscular anomalies detected.  Functionally intact. No obvious neuro-muscular anomalies detected.          Sensory (Neurological)    Musculoskeletal pain pattern          Musculoskeletal pain pattern                  Palpation    No palpable anomalies              No palpable anomalies                      Maneuver Shoulder abduction (deltoid/supraspinatus, axillary/supra scapular n,, C5) Elbow  flexion (biceps brachial, musculoskeletal n, C5-6) Elbow extension (triceps, radial n, C7) Finger abduction (interossei, ulnar n, T1)    Shoulder abduction (deltoid/supraspinatus, axillary/supra scapular n,, C5) Elbow flexion (biceps brachial, musculoskeletal n, C5-6) Elbow extension (triceps, radial n, C7) Wrist extensors (C6) Finger extensors (C8) Finger abduction (interossei, ulnar n, T1)            Provocative Test    Phalen's test: deferred Tinel's test: deferred Apley's scratch test (touch opposite shoulder):  Action 1 (Across chest): deferred Action 2 (Overhead): deferred Action 3 (LB reach): deferred  Phalen's test: deferred Tinel's test: deferred Apley's scratch test (touch  opposite shoulder):  Action 1 (Across chest): deferred Action 2 (Overhead): deferred Action 3 (LB reach): deferred             Level  Myotome  Dermatome  Sclerotome  ROM  C5  Elbow flexion  Lateral upper arm      C6  Wrist extension  Thumb and index      C7  Elbow extension  Middle finger      C8  Finger extension  Ring and pinky finger      T1  Finger abduction  Medial elbow and axilla                                                                                                                                         Assessment   Diagnosis Status  1. Cervical radiculopathy (left)   2. Chronic pain syndrome   3. Chronic cervical pain   4. Lumbar radiculopathy   5. Chronic radicular lumbar pain   6. Medication management    Controlled Controlled Controlled   Updated Problems: No problems updated.  Plan of Care  Problem-specific:  Assessment and Plan    Monitoring: [place] PMP: PDMP reviewed during this encounter. {Blank single:19197::Unable to conduct review of the controlled substance reporting system due to technological failure.,     } Pharmacotherapy: {Blank single:19197::Opioid-induced constipation (OIC)(K59.03, T40.2X5A),No side-effects or adverse reactions  reported.} Compliance: {Blank single:19197::Medication agreement violation - unsanctioned acquisition/use of additional opioid-containing medication,No problems identified.} Effectiveness: {Blank single:19197::Clinically acceptable.}  No notes on file  UDS:  Summary  Date Value Ref Range Status  02/29/2024 FINAL  Final    Comment:    ==================================================================== [employer] (MW) ==================================================================== Test                             Result       Flag       Units  Drug Present   Hydrocodone                     3742                    ng/mg creat   Hydromorphone                   407                     ng/mg creat   Dihydrocodeine                 186                     ng/mg creat   Norhydrocodone                 2130  ng/mg creat    Sources of hydrocodone  include scheduled prescription medications.    Hydromorphone , dihydrocodeine and norhydrocodone are expected    metabolites of hydrocodone . Hydromorphone  and dihydrocodeine are    also available as scheduled prescription medications.  ==================================================================== Test                      Result    Flag   Units      Ref Range   Creatinine              43               mg/dL      >=79 ==================================================================== Declared Medications:  Medication list was not provided. ==================================================================== For clinical consultation, please call [phone]. ====================================================================     No results found for: CBDTHCR No results found for: D8THCCBX No results found for: D9THCCBX  ROS  Constitutional: {Blank single:19197::Denies any fever or chills} Gastrointestinal: {Blank single:19197::No reported hemesis, hematochezia, vomiting, or acute GI  distress} Musculoskeletal: {Blank single:19197::Denies any acute onset joint swelling, redness, loss of ROM, or weakness} Neurological: {Blank single:19197::No reported episodes of acute onset apraxia, aphasia, dysarthria, agnosia, amnesia, paralysis, loss of coordination, or loss of consciousness}  Medication Review  HYDROcodone  Bitartrate ER, Multiple Vitamins-Minerals, NIFEdipine , Oxygen -Helium, Potassium Gluconate, Vitamin D3, albuterol , anastrozole , clobetasol  cream, cyclobenzaprine , desonide , gabapentin , metoprolol  succinate, omega-3 acid ethyl esters, omeprazole , risankizumab-rzaa, rosuvastatin , sertraline, telmisartan, torsemide , umeclidinium-vilanterol, valbenazine , and venlafaxine  XR  History Review  Allergy: Judy Allen is allergic to aczone [dapsone], abilify  [aripiprazole ], atarax  [hydroxyzine ], hydrochlorothiazide , sulfa antibiotics, and sulfasalazine. Drug: Judy Allen  reports no history of drug use. Alcohol:  reports no history of alcohol use. Tobacco:  reports that she quit smoking about 18 years ago. Her smoking use included cigarettes. She started smoking about 61 years ago. She has a 42.4 pack-year smoking history. She has never used smokeless tobacco. Social: Judy Allen  reports that she quit smoking about 18 years ago. Her smoking use included cigarettes. She started smoking about 61 years ago. She has a 42.4 pack-year smoking history. She has never used smokeless tobacco. She reports that she does not drink alcohol and does not use drugs. Medical:  has a past medical history of Allergy, Anal squamous cell carcinoma (HCC) (03/10/2016), Anemia, Anxiety, Aortic atherosclerosis, Arthritis, Breast cancer, left (HCC) (07/10/2022), Cataract (09/2021), Complication of anesthesia, Constipation, COPD (chronic obstructive pulmonary disease) (HCC), Coronary artery calcification seen on CT scan, Depression, Diastolic dysfunction (06/08/2021), Emphysema of lung (HCC), GERD (gastroesophageal  reflux disease), Goals of care, counseling/discussion (07/10/2022), H/O allergic rhinitis, Heart murmur, Hemorrhoid, History of fusion of cervical spine, History of kidney stones, Hyperlipidemia, Hyperparathyroidism, Hypertension, Macular degeneration, 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 Surgical: Judy Allen  has a past surgical history that includes Tubal ligation; Anterior fusion lumbar spine (1990); Mass excision (Left, 02/23/2016); Colonoscopy (2018); Portacath placement (Left, 03/12/2016); Cardiac catheterization (2009? ); Cardiac catheterization (05/2012); Port-a-cath removal (2018); Liver biopsy (N/A, 09/12/2017); Cholecystectomy (N/A, 09/12/2017); Trigger finger release (Right, 09/2018); Anterior cervical decomp/discectomy fusion (N/A, 2001); Tonsillectomy (Bilateral, 1959); Anterior and posterior repair (N/A, 12/28/2021); Vaginal hysterectomy (N/A, 12/28/2021); Breast biopsy (Left, 07/05/2022); Breast biopsy (Left, 07/05/2022); Breast biopsy (Left, 07/22/2022); Part mastectomy,radio frequency localizer,axillary sentinel node biopsy (  Left, 07/26/2022); Breast biopsy (Left, 06/08/2023); Breast biopsy (Left, 06/08/2023); Abdominal hysterectomy (Dec 28, 2021); Breast surgery (07/06/2022); and Spine surgery (1995 I believe). Family: family history includes Asthma in her mother; Breast cancer in her cousin and maternal aunt; COPD in her mother; Cancer in her brother and father; Heart attack in her mother; Liver cancer (age of onset: 74) in her father; Varicose Veins in her mother.  Laboratory  Chemistry Profile   Renal Lab Results  Component Value Date   BUN 18 06/15/2024   CREATININE 0.84 06/15/2024   LABCREA 127 05/16/2023   BCR SEE NOTE: 05/07/2024   GFRAA 75 04/23/2019   GFRNONAA >60 06/15/2024    Hepatic Lab Results  Component Value Date   AST 30 06/15/2024   ALT 34 06/15/2024   ALBUMIN 4.6 06/15/2024   ALKPHOS 87 06/15/2024   HCVAB NON REACTIVE 12/07/2023   LIPASE 27 08/03/2017    Electrolytes Lab Results  Component Value Date   NA 138 06/15/2024   K 4.1 06/15/2024   CL 102 06/15/2024   CALCIUM  9.4 06/15/2024    Bone Lab Results  Component Value Date   VD25OH 57 07/22/2021    Inflammation (CRP: Acute Phase) (ESR: Chronic Phase) Lab Results  Component Value Date   CRP 1.6 (H) 12/21/2023   ESRSEDRATE 41 (H) 12/21/2023         Note: {Blank single:19197::Judy Allen indicates labs done and monitored by primary care practitioner using a non-[facility] EMR system,No results found under the [facility] electronic medical record,Patient noncompliant with Lab Work orders.,Results made available to patient.,Lab results reviewed and made available to patient.,Lab results reviewed and explained to patient in Layman's terms.,Above Lab results reviewed.}  Recent Imaging Review  MM 3D DIAGNOSTIC MAMMOGRAM BILATERAL BREAST CLINICAL DATA:  LEFT breast lumpectomy in December 2023. No radiation. On anastrozole . No complaints today.  EXAM: DIGITAL DIAGNOSTIC BILATERAL MAMMOGRAM WITH TOMOSYNTHESIS AND CAD  TECHNIQUE: Bilateral digital diagnostic mammography and breast tomosynthesis was performed. The images were evaluated with computer-aided detection.  COMPARISON:  Previous exam(s).  ACR Breast Density Category b: There are scattered areas of fibroglandular density.  FINDINGS: There is density and architectural distortion within the LEFT breast, corresponding to the site of prior lumpectomy. Spot compression magnification view(s) demonstrate no  evidence of recurrent malignancy. Findings are stable compared to prior.  There is no mammographic evidence of malignancy in EITHER breast.  IMPRESSION: No mammographic evidence of malignancy in EITHER breast.  RECOMMENDATION: Diagnostic mammogram of the BILATERAL breasts in 1 year.  I have discussed the findings and recommendations with the patient. If applicable, a reminder letter will be sent to the patient regarding the next appointment.  BI-RADS CATEGORY  2: Benign.  Electronically Signed   By: Norleen Croak M.D.   On: 05/30/2024 15:05 Note: {Blank single:19197::No new results found.,No results found under the [facility] electronic medical record.,Imaging results reviewed and explained to patient in Layman's terms.,Results of ordered imaging test(s) reviewed and explained to patient in Layman's terms.,Imaging results reviewed.,Reviewed} {Blank single:19197::Results visible under [facility].,Results visible under [facility].,Results visible under [facility] HC.,Results visible under [facility].,Results visible under Care Everywhere.,Results made available to patient.,Copy of results provided to patient.,Patient noncompliant with diagnostic imaging orders.,     }  Physical Exam  Vitals: LMP 03/04/2002  BMI: Estimated body mass index is 31.75 kg/m as calculated from the following:   Height as of 05/24/24: 5' 3.75 (1.619 m).   Weight as of 06/15/24: 183 lb 8 oz (83.2  kg). Ideal: Patient weight not recorded General appearance: {general exam:210120802::Well nourished, well developed, and well hydrated. In no apparent acute distress} Mental status: {Blank single:19197::Alert and oriented x 3. Exaggerated physical and/or psychosocial pain behavior perceived.,Alert, oriented x 3 (person, place, & time)} {Blank single:19197::Judy Allen's speech pattern and demeanor seems to suggest oversedation,     } Respiratory: {Blank single:19197::Oxygen -dependent  COPD,No evidence of acute respiratory distress} Eyes: {Blank single:19197::Miotic (pupilary constriction) due to opiate use,Midriatic,Anisocoric,Evidence of ptosis,Pin-point pupils,PERLA}   Assessment   Diagnosis Status  1. Cervical radiculopathy (left)   2. Chronic cervical pain   3. Lumbar radiculopathy   4. Chronic radicular lumbar pain   5. Medication management    {Blank single:19197::Absent,Deteriorating,Having a Flare-up,Improved,Improving,Not improving,Not responding,Persistent,Present,Recurring,Reoccurring,Resolved,Responding,Stable,Unchanged,improved,Worsened,Worsening,Controlled} {Blank single:19197::Absent,Deteriorating,Having a Flare-up,Improved,Improving,Not improving,Not responding,Persistent,Present,Recurring,Reoccurring,Resolved,Responding,Stable,Unchanged,improved,Worsened,Worsening,Controlled} {Blank single:19197::Absent,Deteriorating,Having a Flare-up,Improved,Improving,Not improving,Not responding,Persistent,Present,Recurring,Reoccurring,Resolved,Responding,Stable,Unchanged,improved,Worsened,Worsening,Controlled}   Updated Problems: No problems updated.  Plan of Care  Problem-specific:  Assessment and Plan            Judy Allen has a current medication list which includes the following long-term medication(s): gabapentin , omega-3 acid ethyl esters, omeprazole , potassium gluconate, rosuvastatin , sertraline, and venlafaxine  xr.  Pharmacotherapy (Medications Ordered): No orders of the defined types were placed in this encounter.  Orders:  No orders of the defined types were placed in this encounter.    {There is no content from the last Plan section.}   No follow-ups on file.    Recent Visits Date Type Provider Dept  05/24/24 Office Visit Davidmichael Zarazua K, NP [facility]  Showing recent visits within past 90 days  and meeting all other requirements Future Appointments Date Type Provider Dept  08/15/24 Appointment Carrington Olazabal K, NP [facility]  Showing future appointments within next 90 days and meeting all other requirements  I discussed the assessment and treatment plan with the patient. The patient was provided an opportunity to ask questions and all were answered. The patient agreed with the plan and demonstrated an understanding of the instructions.  Patient advised to call back or seek an in-person evaluation if the symptoms or condition worsens.  Duration of encounter: *** minutes.  Total time on encounter, as per AMA guidelines included both the face-to-face and non-face-to-face time personally spent by the physician and/or other qualified health care professional(s) on the day of the encounter (includes time in activities that require the physician or other qualified health care professional and does not include time in activities normally performed by clinical staff). Physician's time may include the following activities when performed: Preparing to see the patient (e.g., pre-charting review of records, searching for previously ordered imaging, lab work, and nerve conduction tests) Review of prior analgesic pharmacotherapies. Reviewing PMP Interpreting ordered tests (e.g., lab work, imaging, nerve conduction tests) Performing post-procedure evaluations, including interpretation of diagnostic procedures Obtaining and/or reviewing separately obtained history Performing a medically appropriate examination and/or evaluation Counseling and educating the patient/family/caregiver Ordering medications, tests, or procedures Referring and communicating with other health care professionals (when not separately reported) Documenting clinical information in the electronic or other health record Independently interpreting results (not separately reported) and communicating results to the patient/  family/caregiver Care coordination (not separately reported)  Note by: Zilah Villaflor K Declin Rajan, NP (TTS and AI technology used. I apologize for any typographical errors that were not detected and corrected.) Date: 08/15/2024; Time: 2:20 PM

## 2024-08-15 ENCOUNTER — Encounter: Admitting: Nurse Practitioner

## 2024-08-15 DIAGNOSIS — G8929 Other chronic pain: Secondary | ICD-10-CM

## 2024-08-15 DIAGNOSIS — M5412 Radiculopathy, cervical region: Secondary | ICD-10-CM

## 2024-08-15 DIAGNOSIS — M5416 Radiculopathy, lumbar region: Secondary | ICD-10-CM

## 2024-08-15 DIAGNOSIS — Z79899 Other long term (current) drug therapy: Secondary | ICD-10-CM

## 2024-09-03 ENCOUNTER — Encounter: Admitting: Nurse Practitioner

## 2024-09-05 NOTE — Progress Notes (Unsigned)
 PROVIDER NOTE: Interpretation of information contained herein should be left to medically-trained personnel. Specific patient instructions are provided elsewhere under Patient Instructions section of medical record. This document was created in part using AI and STT-dictation technology, any transcriptional errors that may result from this process are unintentional.  Patient: Judy Allen  Service: E/M   PCP: Sowles, Krichna, MD  DOB: January 31, 1956  DOS: 09/06/2024  Provider: Emmy MARLA Blanch, NP  MRN: [id number]  Delivery: Face-to-face  Specialty: Interventional Pain Management  Type: Established Patient  Setting: Ambulatory outpatient facility  Specialty designation: 09  Referring Prov.: Sowles, Krichna, MD  Location: Outpatient office facility       History of present illness (HPI) Judy Allen, a 69 y.o. year old female, is here today because of her neck pain radiating to left arm. Judy Allen primary complain today is Neck Pain  Pertinent problems: Judy Allen has Tachycardia, paroxysmal (HCC); Benign hypertension; Chronic cervical pain; Gastric reflux; Chronic recurrent major depressive disorder; Obstructive apnea; Bursitis, trochanteric; GAD (generalized anxiety disorder); History of fusion of cervical spine; Chronic constipation; Non-thrombocytopenic purpura; Anal squamous cell carcinoma (HCC); Centrilobular emphysema (HCC); Chronic respiratory failure with hypoxia, on home oxygen  therapy (HCC); Fatty liver; Perihepatitis (HCC); Myofascial pain syndrome, cervical; Chronic pain syndrome; Cervical facet joint syndrome; Cervical radiculopathy (left); Osteoarthritis of spine with radiculopathy, cervical region; Disorder of SI (sacroiliac) joint; Arthritis of sacroiliac joint of both sides; Chronic radicular lumbar pain; Bilateral hip pain; S/P vaginal hysterectomy; Breast cancer, left (HCC); Invasive carcinoma of breast (HCC); Breast cancer (HCC); Medication management; Osteopenia; Pulmonary  hypertension (HCC); and Liver cirrhosis (HCC) on their pertinent problem list.  Pain Assessment: Severity of Chronic pain is reported as a 3 /10. Location: Neck Right, Left/Down Left arm. Onset: More than a month ago. Quality: Numbness, Tingling, Sharp. Timing: Intermittent. Modifying factor(s): Pain medication. Vitals:  height is 5' 3.75 (1.619 m) and weight is 181 lb (82.1 kg). Her temporal temperature is 97.3 F (36.3 C) (abnormal). Her blood pressure is 130/72 and her pulse is 87. Her respiration is 18 and oxygen  saturation is 95%.  BMI: Estimated body mass index is 31.31 kg/m as calculated from the following:   Height as of this encounter: 5' 3.75 (1.619 m).   Weight as of this encounter: 181 lb (82.1 kg).  Last encounter: 05/24/2024. Last procedure: Visit date not found.  Reason for encounter: medication management. No change in medical history since last visit.  Patient's pain is at baseline.  Patient continues multimodal pain regimen as prescribed.  States that it provides pain relief and improvement in functional status.   Discussed the use of AI scribe software for clinical note transcription with the patient, who gave verbal consent to proceed.  History of Present Illness   Judy Allen is a 69 year old female who presents with neck pain radiating to the left arm and numbness in the fingertips.  She experiences neck pain that radiates to her left arm, accompanied by numbness in her fingertips. The pain worsens with movement, especially when stretching or rotating her neck, and extends down her spine. The numbness in her fingertips is described as a lack of sensation. She is currently on oxygen  therapy for COPD, using 4 liters when walking and 2 liters when sitting, which limits her options for other pain medications like Butrans  patch.   She has previously received injections on the left side, which initially provided relief but were less effective upon subsequent  administrations.  Her current  medication regimen includes hydrocodone  ER taken every twelve hours, with two refills remaining for gabapentin . She also uses Flexeril  and has an adequate supply until her next visit. She reports no side effects or adverse reactions from her medications.    Pharmacotherapy Assessment   Hydrocodone  ER 10 Mg every 12 hours as needed for pain. MME=20 Monitoring: Judy PMP: PDMP reviewed during this encounter.       Pharmacotherapy: No side-effects or adverse reactions reported. Compliance: No problems identified. Effectiveness: Clinically acceptable.  Judy Allen, [place]  09/06/2024 10:25 AM  Sign when Signing Visit Nursing Pain Medication Assessment:  Safety precautions to be maintained throughout the outpatient stay will include: orient to surroundings, keep bed in low position, maintain call bell within reach at all times, provide assistance with transfer out of bed and ambulation.  Medication Inspection Compliance: Pill count conducted under aseptic conditions, in front of the patient. Neither the pills nor the bottle was removed from the patient's sight at any time. Once count was completed pills were immediately returned to the patient in their original bottle.  Medication: Hydrocodone /APAP Pill/Patch Count: 7 of 60 pills/patches remain Pill/Patch Appearance: Markings consistent with prescribed medication Bottle Appearance: Standard pharmacy container. Clearly labeled. Filled Date: 39 / 4 / 2025 Last Medication intake:  Today    UDS:  Summary  Date Value Ref Range Status  02/29/2024 FINAL  Final    Comment:    ==================================================================== [employer] Select 13 (MW) ==================================================================== Test                             Result       Flag       Units  Drug Present   Hydrocodone                     3742                    ng/mg creat   Hydromorphone                   407                      ng/mg creat   Dihydrocodeine                 186                     ng/mg creat   Norhydrocodone                 2130                    ng/mg creat    Sources of hydrocodone  include scheduled prescription medications.    Hydromorphone , dihydrocodeine and norhydrocodone are expected    metabolites of hydrocodone . Hydromorphone  and dihydrocodeine are    also available as scheduled prescription medications.  ==================================================================== Test                      Result    Flag   Units      Ref Range   Creatinine              43               mg/dL      >=79 ==================================================================== Declared Medications:  Medication list was not provided. ==================================================================== For clinical consultation, please call [phone]. ====================================================================     No results found for: CBDTHCR No results found for: D8THCCBX No results found for: D9THCCBX  ROS  Constitutional: Denies any fever or chills Gastrointestinal: No reported hemesis, hematochezia, vomiting, or acute GI distress Musculoskeletal: neck pain radiate to left arm and fingertip  Neurological: No reported episodes of acute onset apraxia, aphasia, dysarthria, agnosia, amnesia, paralysis, loss of coordination, or loss of consciousness  Medication Review  HYDROcodone  Bitartrate ER, Multiple Vitamins-Minerals, NIFEdipine , Oxygen -Helium, Potassium Gluconate, Vitamin D3, albuterol , anastrozole , cyclobenzaprine , desonide , gabapentin , metoprolol  succinate, omega-3 acid ethyl esters, omeprazole , risankizumab-rzaa, rosuvastatin , sertraline, telmisartan, torsemide , umeclidinium-vilanterol, valbenazine , and venlafaxine  XR  History Review  Allergy: Judy Allen is allergic to aczone [dapsone], abilify  [aripiprazole ], atarax  [hydroxyzine ], hydrochlorothiazide ,  sulfa antibiotics, and sulfasalazine. Drug: Judy Allen  reports no history of drug use. Alcohol:  reports no history of alcohol use. Tobacco:  reports that she quit smoking about 18 years ago. Her smoking use included cigarettes. She started smoking about 61 years ago. She has a 42.4 pack-year smoking history. She has never used smokeless tobacco. Social: Judy Allen  reports that she quit smoking about 18 years ago. Her smoking use included cigarettes. She started smoking about 61 years ago. She has a 42.4 pack-year smoking history. She has never used smokeless tobacco. She reports that she does not drink alcohol and does not use drugs. Medical:  has a past medical history of Allergy, Anal squamous cell carcinoma (HCC) (03/10/2016), Anemia, Anxiety, Aortic atherosclerosis, Arthritis, Breast cancer, left (HCC) (07/10/2022), Cataract (09/2021), Complication of anesthesia, Constipation, COPD (chronic obstructive pulmonary disease) (HCC), Coronary artery calcification seen on CT scan, Depression, Diastolic dysfunction (06/08/2021), Emphysema of lung (HCC), GERD (gastroesophageal reflux disease), Goals of care, counseling/discussion (07/10/2022), H/O allergic rhinitis, Heart murmur, Hemorrhoid, History of fusion of cervical spine, History of kidney stones, Hyperlipidemia, Hyperparathyroidism, Hypertension, Macular degeneration, 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 Surgical: Judy Allen  has a past surgical history that includes Tubal ligation; Anterior fusion lumbar spine (1990); Mass excision (Left,  02/23/2016); Colonoscopy (2018); Portacath placement (Left, 03/12/2016); Cardiac catheterization (2009? ); Cardiac catheterization (05/2012); Port-a-cath removal (2018); Liver biopsy (N/A, 09/12/2017); Cholecystectomy (N/A, 09/12/2017); Trigger finger release (Right, 09/2018); Anterior cervical decomp/discectomy fusion (N/A, 2001); Tonsillectomy (Bilateral, 1959); Anterior and posterior repair (N/A, 12/28/2021); Vaginal hysterectomy (N/A, 12/28/2021); Breast biopsy (Left, 07/05/2022); Breast biopsy (Left, 07/05/2022); Breast biopsy (Left, 07/22/2022); Part mastectomy,radio frequency localizer,axillary sentinel node biopsy (Left, 07/26/2022); Breast biopsy (Left, 06/08/2023); Breast biopsy (Left, 06/08/2023); Abdominal hysterectomy (Dec 28, 2021); Breast surgery (07/06/2022); and Spine surgery (1995 I believe). Family: family history includes Asthma in her mother; Breast cancer in her cousin and maternal aunt; COPD in her mother; Cancer in her brother and father; Heart attack in her mother; Liver cancer (age of onset: 76) in her father; Varicose Veins in her mother.  Laboratory Chemistry Profile   Renal Lab Results  Component Value Date   BUN 18 06/15/2024   CREATININE 0.84 06/15/2024   LABCREA 127 05/16/2023   BCR SEE NOTE: 05/07/2024   GFRAA 75 04/23/2019   GFRNONAA >60 06/15/2024    Hepatic Lab Results  Component Value Date   AST 30 06/15/2024   ALT 34 06/15/2024   ALBUMIN 4.6 06/15/2024   ALKPHOS  87 06/15/2024   HCVAB NON REACTIVE 12/07/2023   LIPASE 27 08/03/2017    Electrolytes Lab Results  Component Value Date   NA 138 06/15/2024   K 4.1 06/15/2024   CL 102 06/15/2024   CALCIUM  9.4 06/15/2024    Bone Lab Results  Component Value Date   VD25OH 57 07/22/2021    Inflammation (CRP: Acute Phase) (ESR: Chronic Phase) Lab Results  Component Value Date   CRP 1.6 (H) 12/21/2023   ESRSEDRATE 41 (H) 12/21/2023         Note: Above Lab results reviewed.  Recent Imaging Review   MM 3D DIAGNOSTIC MAMMOGRAM BILATERAL BREAST CLINICAL DATA:  LEFT breast lumpectomy in December 2023. No radiation. On anastrozole . No complaints today.  EXAM: DIGITAL DIAGNOSTIC BILATERAL MAMMOGRAM WITH TOMOSYNTHESIS AND CAD  TECHNIQUE: Bilateral digital diagnostic mammography and breast tomosynthesis was performed. The images were evaluated with computer-aided detection.  COMPARISON:  Previous exam(s).  ACR Breast Density Category b: There are scattered areas of fibroglandular density.  FINDINGS: There is density and architectural distortion within the LEFT breast, corresponding to the site of prior lumpectomy. Spot compression magnification view(s) demonstrate no evidence of recurrent malignancy. Findings are stable compared to prior.  There is no mammographic evidence of malignancy in EITHER breast.  IMPRESSION: No mammographic evidence of malignancy in EITHER breast.  RECOMMENDATION: Diagnostic mammogram of the BILATERAL breasts in 1 year.  I have discussed the findings and recommendations with the patient. If applicable, a reminder letter will be sent to the patient regarding the next appointment.  BI-RADS CATEGORY  2: Benign.  Electronically Signed   By: Norleen Croak M.D.   On: 05/30/2024 15:05 Note: Reviewed        Physical Exam  Vitals: BP 130/72 (BP Location: Right Arm, Patient Position: Sitting, Cuff Size: Normal)   Pulse 87   Temp (!) 97.3 F (36.3 C) (Temporal)   Resp 18   Ht 5' 3.75 (1.619 m)   Wt 181 lb (82.1 kg)   LMP 03/04/2002   SpO2 95%   PF (!) 2 L/min   BMI 31.31 kg/m  BMI: Estimated body mass index is 31.31 kg/m as calculated from the following:   Height as of this encounter: 5' 3.75 (1.619 m).   Weight as of this encounter: 181 lb (82.1 kg). Ideal: Ideal body weight: 54.1 kg (119 lb 5.2 oz) Adjusted ideal body weight: 65.3 kg (143 lb 15.9 oz) General appearance: Well nourished, well developed, and well hydrated. In no apparent  acute distress Mental status: Alert, oriented x 3 (person, place, & time)       Respiratory: No evidence of acute respiratory distress Eyes: PERLA  Cervical Spine Exam  Skin & Axial Inspection: No masses, redness, edema, swelling, or associated skin lesions Alignment: Symmetrical Functional ROM: Pain restricted ROM, to the left Stability: No instability detected Muscle Tone/Strength: Functionally intact. No obvious neuro-muscular anomalies detected. Sensory (Neurological): Dermatomal pain pattern Palpation: No palpable anomalies             Assessment   Diagnosis Status  1. Cervical radiculopathy (left)   2. Chronic cervical pain   3. Medication management   4. Lumbar radiculopathy   5. Chronic radicular lumbar pain   6. Chronic pain syndrome     Controlled Controlled Controlled   Updated Problems: No problems updated.  Plan of Care  Problem-specific:  Assessment and Plan    Cervical radiculopathy with chronic neck pain Chronic neck pain with left arm radiation and  fingertip numbness. Pain worsens with movement. Previous injections provided temporary relief. Current management includes hydrocodone  ER and gabapentin  without adverse reactions. - Continue hydrocodone  ER every 12 hours. - Continue gabapentin  as needed. - Perform stretching exercises, including flexion, extension, and shoulder rotation, especially after warm showers. - Consider interventional therapy if symptoms do not improve with stretching exercises.  Chronic pain management Ongoing management with hydrocodone  ER and gabapentin . No side effects or cost issues reported. - Sent hydrocodone  prescription to [facility] at [place]. [employer] gabapentin  refills are available as needed.  Medication management:  Patient's pain is controlled with hydrocodone  ER, will continue on current medication regimen.  Prescribing drug monitoring (PMP) reviewed, findings consistent with the use of prescribed medication  and no evidence of narcotic misuse or abuse.  Urine drug screening (UDS) up-to-date and consistent with the use of prescribed medication.  Schedule follow-up in 90 days for medication management.     Judy Allen has a current medication list which includes the following long-term medication(s): gabapentin , omega-3 acid ethyl esters, omeprazole , potassium gluconate, rosuvastatin , sertraline, and venlafaxine  xr.  Pharmacotherapy (Medications Ordered): Meds ordered this encounter  Medications   HYDROcodone  Bitartrate ER 10 MG CP12    Sig: Take 10 mg by mouth every 12 (twelve) hours. Must last 30 days.    Dispense:  60 capsule    Refill:  0    Chronic Pain: STOP Act (Not applicable) Fill 1 day early if closed on refill date. Avoid benzodiazepines within 8 hours of opioids   HYDROcodone  Bitartrate ER 10 MG CP12    Sig: Take 10 mg by mouth every 12 (twelve) hours. Must last 30 days.    Dispense:  60 capsule    Refill:  0    Chronic Pain: STOP Act (Not applicable) Fill 1 day early if closed on refill date. Avoid benzodiazepines within 8 hours of opioids   HYDROcodone  Bitartrate ER 10 MG CP12    Sig: Take 10 mg by mouth every 12 (twelve) hours. Must last 30 days.    Dispense:  60 capsule    Refill:  0    Chronic Pain: STOP Act (Not applicable) Fill 1 day early if closed on refill date. Avoid benzodiazepines within 8 hours of opioids   Orders:  No orders of the defined types were placed in this encounter.      Return in about 3 months (around 12/05/2024) for (F2F), (MM), Judy Blanch NP.    Recent Visits No visits were found meeting these conditions. Showing recent visits within past 90 days and meeting all other requirements Today's Visits Date Type Provider Dept  09/06/24 Office Visit Kelly Ranieri K, NP [facility]  Showing today's visits and meeting all other requirements Future Appointments Date Type Provider Dept  11/26/24 Appointment Thomson Herbers K, NP [facility]  Showing future appointments within next 90 days and meeting all other requirements  I discussed the assessment and treatment plan with the patient. The patient was provided an opportunity to ask questions and all were answered. The patient agreed with the plan and demonstrated an understanding of the instructions.  Patient advised to call back or seek an in-person evaluation if the symptoms or condition worsens.  I personally spent a total of 30 minutes in the care of the patient today including preparing to see the patient, getting/reviewing separately obtained history, performing a medically appropriate exam/evaluation, counseling and educating, placing orders, referring and communicating with other health care professionals, documenting  clinical information in the EHR, independently interpreting results, communicating results, and coordinating care.   Note by: Mckale Haffey K Lira Stephen, NP (TTS and AI technology used. I apologize for any typographical errors that were not detected and corrected.) Date: 09/06/2024; Time: 10:58 AM

## 2024-09-06 ENCOUNTER — Ambulatory Visit: Admitting: Nurse Practitioner

## 2024-09-06 ENCOUNTER — Encounter: Payer: Self-pay | Admitting: Nurse Practitioner

## 2024-09-06 DIAGNOSIS — G894 Chronic pain syndrome: Secondary | ICD-10-CM | POA: Insufficient documentation

## 2024-09-06 DIAGNOSIS — G8929 Other chronic pain: Secondary | ICD-10-CM | POA: Insufficient documentation

## 2024-09-06 DIAGNOSIS — M542 Cervicalgia: Secondary | ICD-10-CM | POA: Diagnosis present

## 2024-09-06 DIAGNOSIS — Z79899 Other long term (current) drug therapy: Secondary | ICD-10-CM | POA: Insufficient documentation

## 2024-09-06 DIAGNOSIS — M5416 Radiculopathy, lumbar region: Secondary | ICD-10-CM | POA: Insufficient documentation

## 2024-09-06 DIAGNOSIS — M5412 Radiculopathy, cervical region: Secondary | ICD-10-CM | POA: Diagnosis present

## 2024-09-06 MED ORDER — HYDROCODONE BITARTRATE ER 10 MG PO CP12: 10.0000 mg | ORAL_CAPSULE | Freq: Two times a day (BID) | ORAL | 0 refills | Status: AC

## 2024-09-06 NOTE — Patient Instructions (Signed)

## 2024-09-06 NOTE — Progress Notes (Signed)
 Nursing Pain Medication Assessment:  Safety precautions to be maintained throughout the outpatient stay will include: orient to surroundings, keep bed in low position, maintain call bell within reach at all times, provide assistance with transfer out of bed and ambulation.  Medication Inspection Compliance: Pill count conducted under aseptic conditions, in front of the patient. Neither the pills nor the bottle was removed from the patient's sight at any time. Once count was completed pills were immediately returned to the patient in their original bottle.  Medication: Hydrocodone /APAP Pill/Patch Count: 7 of 60 pills/patches remain Pill/Patch Appearance: Markings consistent with prescribed medication Bottle Appearance: Standard pharmacy container. Clearly labeled. Filled Date: 16 / 65 / 2025 Last Medication intake:  Today

## 2024-11-05 ENCOUNTER — Ambulatory Visit: Admitting: Family Medicine

## 2024-11-26 ENCOUNTER — Encounter: Admitting: Nurse Practitioner

## 2024-11-27 ENCOUNTER — Other Ambulatory Visit

## 2024-12-13 ENCOUNTER — Inpatient Hospital Stay: Admitting: Oncology

## 2024-12-13 ENCOUNTER — Inpatient Hospital Stay
